# Patient Record
Sex: Female | Born: 1945 | Race: Black or African American | Hispanic: No | Smoking: Never smoker
Health system: Southern US, Community
[De-identification: ages and names within clinical notes are randomized; demographics above are authoritative.]

## PROBLEM LIST (undated history)

## (undated) DIAGNOSIS — K219 Gastro-esophageal reflux disease without esophagitis: Secondary | ICD-10-CM

## (undated) DIAGNOSIS — E039 Hypothyroidism, unspecified: Secondary | ICD-10-CM

## (undated) DIAGNOSIS — H409 Unspecified glaucoma: Secondary | ICD-10-CM

## (undated) DIAGNOSIS — R0683 Snoring: Secondary | ICD-10-CM

## (undated) DIAGNOSIS — F419 Anxiety disorder, unspecified: Secondary | ICD-10-CM

## (undated) DIAGNOSIS — E785 Hyperlipidemia, unspecified: Secondary | ICD-10-CM

## (undated) DIAGNOSIS — F32A Depression, unspecified: Secondary | ICD-10-CM

## (undated) DIAGNOSIS — R5383 Other fatigue: Secondary | ICD-10-CM

## (undated) DIAGNOSIS — E119 Type 2 diabetes mellitus without complications: Secondary | ICD-10-CM

## (undated) DIAGNOSIS — F329 Major depressive disorder, single episode, unspecified: Secondary | ICD-10-CM

## (undated) DIAGNOSIS — G4730 Sleep apnea, unspecified: Secondary | ICD-10-CM

## (undated) DIAGNOSIS — D649 Anemia, unspecified: Secondary | ICD-10-CM

## (undated) DIAGNOSIS — E669 Obesity, unspecified: Secondary | ICD-10-CM

## (undated) DIAGNOSIS — H269 Unspecified cataract: Secondary | ICD-10-CM

## (undated) DIAGNOSIS — R002 Palpitations: Secondary | ICD-10-CM

## (undated) DIAGNOSIS — M1990 Unspecified osteoarthritis, unspecified site: Secondary | ICD-10-CM

## (undated) DIAGNOSIS — R413 Other amnesia: Secondary | ICD-10-CM

## (undated) DIAGNOSIS — R0902 Hypoxemia: Secondary | ICD-10-CM

## (undated) DIAGNOSIS — I10 Essential (primary) hypertension: Secondary | ICD-10-CM

## (undated) HISTORY — DX: Other fatigue: R53.83

## (undated) HISTORY — PX: REFRACTIVE SURGERY: SHX103

## (undated) HISTORY — DX: Hypothyroidism, unspecified: E03.9

## (undated) HISTORY — DX: Depression, unspecified: F32.A

## (undated) HISTORY — DX: Hyperlipidemia, unspecified: E78.5

## (undated) HISTORY — DX: Anemia, unspecified: D64.9

## (undated) HISTORY — DX: Other amnesia: R41.3

## (undated) HISTORY — DX: Anxiety disorder, unspecified: F41.9

## (undated) HISTORY — DX: Unspecified osteoarthritis, unspecified site: M19.90

## (undated) HISTORY — DX: Unspecified cataract: H26.9

## (undated) HISTORY — DX: Major depressive disorder, single episode, unspecified: F32.9

## (undated) HISTORY — DX: Snoring: R06.83

## (undated) HISTORY — PX: ROTATOR CUFF REPAIR: SHX139

## (undated) HISTORY — PX: TUBAL LIGATION: SHX77

## (undated) HISTORY — PX: CATARACT EXTRACTION: SUR2

## (undated) HISTORY — PX: FOOT SURGERY: SHX648

## (undated) HISTORY — PX: OTHER SURGICAL HISTORY: SHX169

## (undated) HISTORY — DX: Type 2 diabetes mellitus without complications: E11.9

## (undated) HISTORY — DX: Unspecified glaucoma: H40.9

## (undated) HISTORY — DX: Essential (primary) hypertension: I10

## (undated) HISTORY — DX: Obesity, unspecified: E66.9

## (undated) HISTORY — DX: Sleep apnea, unspecified: G47.30

## (undated) HISTORY — DX: Palpitations: R00.2

## (undated) HISTORY — DX: Hypoxemia: R09.02

## (undated) HISTORY — DX: Gastro-esophageal reflux disease without esophagitis: K21.9

---

## 1998-07-15 ENCOUNTER — Other Ambulatory Visit: Admission: RE | Admit: 1998-07-15 | Discharge: 1998-07-15 | Payer: Self-pay | Admitting: Obstetrics

## 1998-10-13 ENCOUNTER — Emergency Department (HOSPITAL_COMMUNITY): Admission: EM | Admit: 1998-10-13 | Discharge: 1998-10-13 | Payer: Self-pay | Admitting: Emergency Medicine

## 1998-12-19 ENCOUNTER — Ambulatory Visit (HOSPITAL_COMMUNITY): Admission: RE | Admit: 1998-12-19 | Discharge: 1998-12-19 | Payer: Self-pay | Admitting: Nephrology

## 1998-12-19 ENCOUNTER — Encounter: Payer: Self-pay | Admitting: Nephrology

## 2000-08-31 ENCOUNTER — Other Ambulatory Visit: Admission: RE | Admit: 2000-08-31 | Discharge: 2000-08-31 | Payer: Self-pay | Admitting: Obstetrics

## 2000-10-26 ENCOUNTER — Encounter: Admission: RE | Admit: 2000-10-26 | Discharge: 2000-10-26 | Payer: Self-pay | Admitting: Nephrology

## 2000-10-26 ENCOUNTER — Encounter: Payer: Self-pay | Admitting: Nephrology

## 2000-11-10 ENCOUNTER — Encounter: Admission: RE | Admit: 2000-11-10 | Discharge: 2000-11-10 | Payer: Self-pay | Admitting: Nephrology

## 2000-11-10 ENCOUNTER — Encounter: Payer: Self-pay | Admitting: Nephrology

## 2001-08-09 ENCOUNTER — Emergency Department (HOSPITAL_COMMUNITY): Admission: EM | Admit: 2001-08-09 | Discharge: 2001-08-09 | Payer: Self-pay | Admitting: Emergency Medicine

## 2001-10-13 ENCOUNTER — Ambulatory Visit (HOSPITAL_COMMUNITY): Admission: RE | Admit: 2001-10-13 | Discharge: 2001-10-13 | Payer: Self-pay | Admitting: Cardiovascular Disease

## 2001-10-13 ENCOUNTER — Encounter: Payer: Self-pay | Admitting: Cardiovascular Disease

## 2001-11-29 ENCOUNTER — Ambulatory Visit (HOSPITAL_COMMUNITY): Admission: RE | Admit: 2001-11-29 | Discharge: 2001-11-29 | Payer: Self-pay | Admitting: Cardiology

## 2002-03-24 ENCOUNTER — Ambulatory Visit (HOSPITAL_COMMUNITY): Admission: RE | Admit: 2002-03-24 | Discharge: 2002-03-24 | Payer: Self-pay | Admitting: Cardiology

## 2002-07-31 ENCOUNTER — Encounter: Admission: RE | Admit: 2002-07-31 | Discharge: 2002-07-31 | Payer: Self-pay | Admitting: Nephrology

## 2002-07-31 ENCOUNTER — Encounter: Payer: Self-pay | Admitting: Nephrology

## 2003-05-22 LAB — CONVERTED CEMR LAB: Pap Smear: NORMAL

## 2003-07-19 ENCOUNTER — Emergency Department (HOSPITAL_COMMUNITY): Admission: EM | Admit: 2003-07-19 | Discharge: 2003-07-19 | Payer: Self-pay

## 2003-10-11 ENCOUNTER — Other Ambulatory Visit (HOSPITAL_COMMUNITY): Admission: RE | Admit: 2003-10-11 | Discharge: 2003-10-15 | Payer: Self-pay | Admitting: Psychiatry

## 2003-10-15 ENCOUNTER — Inpatient Hospital Stay (HOSPITAL_COMMUNITY): Admission: EM | Admit: 2003-10-15 | Discharge: 2003-10-21 | Payer: Self-pay | Admitting: Psychiatry

## 2003-10-18 ENCOUNTER — Ambulatory Visit (HOSPITAL_COMMUNITY): Admission: RE | Admit: 2003-10-18 | Discharge: 2003-10-18 | Payer: Self-pay | Admitting: Psychiatry

## 2003-10-22 ENCOUNTER — Other Ambulatory Visit (HOSPITAL_COMMUNITY): Admission: RE | Admit: 2003-10-22 | Discharge: 2003-11-02 | Payer: Self-pay | Admitting: Psychiatry

## 2003-11-28 ENCOUNTER — Inpatient Hospital Stay (HOSPITAL_COMMUNITY): Admission: EM | Admit: 2003-11-28 | Discharge: 2003-12-01 | Payer: Self-pay | Admitting: Psychiatry

## 2003-12-12 ENCOUNTER — Inpatient Hospital Stay (HOSPITAL_COMMUNITY): Admission: EM | Admit: 2003-12-12 | Discharge: 2003-12-15 | Payer: Self-pay | Admitting: Psychiatry

## 2004-01-23 ENCOUNTER — Emergency Department (HOSPITAL_COMMUNITY): Admission: EM | Admit: 2004-01-23 | Discharge: 2004-01-23 | Payer: Self-pay | Admitting: Family Medicine

## 2004-01-27 ENCOUNTER — Inpatient Hospital Stay (HOSPITAL_COMMUNITY): Admission: AD | Admit: 2004-01-27 | Discharge: 2004-02-06 | Payer: Self-pay | Admitting: Family Medicine

## 2004-05-01 ENCOUNTER — Emergency Department (HOSPITAL_COMMUNITY): Admission: EM | Admit: 2004-05-01 | Discharge: 2004-05-01 | Payer: Self-pay | Admitting: Family Medicine

## 2006-02-19 ENCOUNTER — Inpatient Hospital Stay (HOSPITAL_COMMUNITY): Admission: EM | Admit: 2006-02-19 | Discharge: 2006-02-23 | Payer: Self-pay | Admitting: Emergency Medicine

## 2006-04-06 ENCOUNTER — Ambulatory Visit (HOSPITAL_BASED_OUTPATIENT_CLINIC_OR_DEPARTMENT_OTHER): Admission: RE | Admit: 2006-04-06 | Discharge: 2006-04-06 | Payer: Self-pay | Admitting: Cardiology

## 2006-04-11 ENCOUNTER — Ambulatory Visit: Payer: Self-pay | Admitting: Internal Medicine

## 2006-05-25 ENCOUNTER — Ambulatory Visit (HOSPITAL_BASED_OUTPATIENT_CLINIC_OR_DEPARTMENT_OTHER): Admission: RE | Admit: 2006-05-25 | Discharge: 2006-05-25 | Payer: Self-pay | Admitting: Cardiology

## 2006-05-30 ENCOUNTER — Ambulatory Visit: Payer: Self-pay | Admitting: Internal Medicine

## 2006-09-25 ENCOUNTER — Inpatient Hospital Stay (HOSPITAL_COMMUNITY): Admission: EM | Admit: 2006-09-25 | Discharge: 2006-09-28 | Payer: Self-pay | Admitting: Family Medicine

## 2006-10-15 ENCOUNTER — Emergency Department (HOSPITAL_COMMUNITY): Admission: EM | Admit: 2006-10-15 | Discharge: 2006-10-15 | Payer: Self-pay | Admitting: Family Medicine

## 2006-10-18 ENCOUNTER — Ambulatory Visit: Payer: Self-pay | Admitting: Gastroenterology

## 2006-11-10 ENCOUNTER — Ambulatory Visit: Payer: Self-pay | Admitting: Gastroenterology

## 2006-11-23 ENCOUNTER — Ambulatory Visit: Payer: Self-pay | Admitting: Gastroenterology

## 2006-11-24 ENCOUNTER — Ambulatory Visit: Payer: Self-pay | Admitting: *Deleted

## 2007-02-23 LAB — HM COLONOSCOPY: HM Colonoscopy: NORMAL

## 2007-03-04 ENCOUNTER — Emergency Department (HOSPITAL_COMMUNITY): Admission: EM | Admit: 2007-03-04 | Discharge: 2007-03-05 | Payer: Self-pay | Admitting: Family Medicine

## 2007-03-18 ENCOUNTER — Inpatient Hospital Stay (HOSPITAL_COMMUNITY): Admission: EM | Admit: 2007-03-18 | Discharge: 2007-03-23 | Payer: Self-pay | Admitting: Emergency Medicine

## 2007-03-29 ENCOUNTER — Encounter: Admission: RE | Admit: 2007-03-29 | Discharge: 2007-06-27 | Payer: Self-pay | Admitting: Cardiology

## 2008-07-10 ENCOUNTER — Encounter: Admission: RE | Admit: 2008-07-10 | Discharge: 2008-07-10 | Payer: Self-pay | Admitting: Cardiology

## 2009-02-20 LAB — HM DIABETES EYE EXAM: HM Diabetic Eye Exam: NORMAL

## 2009-05-06 ENCOUNTER — Ambulatory Visit: Payer: Self-pay | Admitting: Internal Medicine

## 2009-05-06 DIAGNOSIS — D519 Vitamin B12 deficiency anemia, unspecified: Secondary | ICD-10-CM | POA: Insufficient documentation

## 2009-05-06 DIAGNOSIS — F418 Other specified anxiety disorders: Secondary | ICD-10-CM | POA: Insufficient documentation

## 2009-05-06 DIAGNOSIS — K219 Gastro-esophageal reflux disease without esophagitis: Secondary | ICD-10-CM | POA: Insufficient documentation

## 2009-05-06 DIAGNOSIS — I10 Essential (primary) hypertension: Secondary | ICD-10-CM | POA: Insufficient documentation

## 2009-05-06 DIAGNOSIS — E785 Hyperlipidemia, unspecified: Secondary | ICD-10-CM | POA: Insufficient documentation

## 2009-05-06 DIAGNOSIS — E118 Type 2 diabetes mellitus with unspecified complications: Secondary | ICD-10-CM | POA: Insufficient documentation

## 2009-05-06 LAB — CONVERTED CEMR LAB
ALT: 30 units/L (ref 0–35)
AST: 36 units/L (ref 0–37)
Albumin: 3.9 g/dL (ref 3.5–5.2)
Alkaline Phosphatase: 79 units/L (ref 39–117)
BUN: 11 mg/dL (ref 6–23)
Basophils Absolute: 0 10*3/uL (ref 0.0–0.1)
Basophils Relative: 0.4 % (ref 0.0–3.0)
Bilirubin Urine: NEGATIVE
Bilirubin, Direct: 0.1 mg/dL (ref 0.0–0.3)
CO2: 22 meq/L (ref 19–32)
Calcium: 9.2 mg/dL (ref 8.4–10.5)
Chloride: 109 meq/L (ref 96–112)
Cholesterol: 235 mg/dL — ABNORMAL HIGH (ref 0–200)
Creatinine, Ser: 0.8 mg/dL (ref 0.4–1.2)
Creatinine,U: 128.7 mg/dL
Direct LDL: 133.1 mg/dL
Eosinophils Absolute: 0.1 10*3/uL (ref 0.0–0.7)
Eosinophils Relative: 1.8 % (ref 0.0–5.0)
Folate: 12.9 ng/mL
GFR calc non Af Amer: 93.73 mL/min (ref >60)
Glucose, Bld: 142 mg/dL — ABNORMAL HIGH (ref 70–99)
HCT: 29 % — ABNORMAL LOW (ref 36.0–46.0)
HDL: 40.2 mg/dL (ref >39.00)
Hemoglobin, Urine: NEGATIVE
Hemoglobin: 9.9 g/dL — ABNORMAL LOW (ref 12.0–15.0)
Hgb A1c MFr Bld: 6.7 % — ABNORMAL HIGH (ref 4.6–6.5)
Iron: 43 ug/dL (ref 42–145)
Ketones, ur: NEGATIVE mg/dL
Leukocytes, UA: NEGATIVE
Lymphocytes Relative: 34.1 % (ref 12.0–46.0)
Lymphs Abs: 2.6 10*3/uL (ref 0.7–4.0)
MCHC: 34.1 g/dL (ref 30.0–36.0)
MCV: 84.1 fL (ref 78.0–100.0)
Microalb Creat Ratio: 9.3 mg/g (ref 0.0–30.0)
Microalb, Ur: 1.2 mg/dL (ref 0.0–1.9)
Monocytes Absolute: 0.6 10*3/uL (ref 0.1–1.0)
Monocytes Relative: 8 % (ref 3.0–12.0)
Neutro Abs: 4.3 10*3/uL (ref 1.4–7.7)
Neutrophils Relative %: 55.7 % (ref 43.0–77.0)
Nitrite: NEGATIVE
Platelets: 326 10*3/uL (ref 150.0–400.0)
Potassium: 3.9 meq/L (ref 3.5–5.1)
RBC: 3.45 M/uL — ABNORMAL LOW (ref 3.87–5.11)
RDW: 15.9 % — ABNORMAL HIGH (ref 11.5–14.6)
Saturation Ratios: 8.6 % — ABNORMAL LOW (ref 20.0–50.0)
Sodium: 140 meq/L (ref 135–145)
Specific Gravity, Urine: 1.02 (ref 1.000–1.030)
TSH: 2.84 microintl units/mL (ref 0.35–5.50)
Total Bilirubin: 0.5 mg/dL (ref 0.3–1.2)
Total CHOL/HDL Ratio: 6
Total CK: 150 units/L (ref 7–177)
Total Protein, Urine: NEGATIVE mg/dL
Total Protein: 7.3 g/dL (ref 6.0–8.3)
Transferrin: 358.8 mg/dL (ref 212.0–360.0)
Triglycerides: 342 mg/dL — ABNORMAL HIGH (ref 0.0–149.0)
Urine Glucose: NEGATIVE mg/dL
Urobilinogen, UA: 0.2 (ref 0.0–1.0)
VLDL: 68.4 mg/dL — ABNORMAL HIGH (ref 0.0–40.0)
Vitamin B-12: 313 pg/mL (ref 211–911)
WBC: 7.6 10*3/uL (ref 4.5–10.5)
pH: 5.5 (ref 5.0–8.0)

## 2009-05-09 ENCOUNTER — Encounter: Admission: RE | Admit: 2009-05-09 | Discharge: 2009-05-15 | Payer: Self-pay | Admitting: Internal Medicine

## 2009-05-24 ENCOUNTER — Telehealth: Payer: Self-pay | Admitting: Internal Medicine

## 2009-06-19 ENCOUNTER — Ambulatory Visit: Payer: Self-pay | Admitting: Internal Medicine

## 2009-09-21 ENCOUNTER — Emergency Department (HOSPITAL_COMMUNITY): Admission: EM | Admit: 2009-09-21 | Discharge: 2009-09-21 | Payer: Self-pay | Admitting: Family Medicine

## 2009-09-25 ENCOUNTER — Ambulatory Visit: Payer: Self-pay | Admitting: Internal Medicine

## 2009-10-25 ENCOUNTER — Ambulatory Visit: Payer: Self-pay | Admitting: Internal Medicine

## 2009-10-25 DIAGNOSIS — J4520 Mild intermittent asthma, uncomplicated: Secondary | ICD-10-CM | POA: Insufficient documentation

## 2009-10-25 LAB — CONVERTED CEMR LAB
ALT: 30 units/L (ref 0–35)
AST: 39 units/L — ABNORMAL HIGH (ref 0–37)
Albumin: 4.2 g/dL (ref 3.5–5.2)
Alkaline Phosphatase: 83 units/L (ref 39–117)
BUN: 10 mg/dL (ref 6–23)
Basophils Absolute: 0.1 10*3/uL (ref 0.0–0.1)
Basophils Relative: 0.9 % (ref 0.0–3.0)
Bilirubin Urine: NEGATIVE
Bilirubin, Direct: 0.1 mg/dL (ref 0.0–0.3)
CO2: 27 meq/L (ref 19–32)
Calcium: 9.2 mg/dL (ref 8.4–10.5)
Chloride: 104 meq/L (ref 96–112)
Creatinine, Ser: 0.8 mg/dL (ref 0.4–1.2)
Eosinophils Absolute: 0.1 10*3/uL (ref 0.0–0.7)
Eosinophils Relative: 1.4 % (ref 0.0–5.0)
GFR calc non Af Amer: 93.58 mL/min (ref >60)
Glucose, Bld: 145 mg/dL — ABNORMAL HIGH (ref 70–99)
HCT: 30.1 % — ABNORMAL LOW (ref 36.0–46.0)
Hemoglobin, Urine: NEGATIVE
Hemoglobin: 9.9 g/dL — ABNORMAL LOW (ref 12.0–15.0)
Hgb A1c MFr Bld: 7.4 % — ABNORMAL HIGH (ref 4.6–6.5)
Ketones, ur: NEGATIVE mg/dL
Leukocytes, UA: NEGATIVE
Lymphocytes Relative: 26.9 % (ref 12.0–46.0)
Lymphs Abs: 2.6 10*3/uL (ref 0.7–4.0)
MCHC: 33 g/dL (ref 30.0–36.0)
MCV: 80.8 fL (ref 78.0–100.0)
Monocytes Absolute: 0.7 10*3/uL (ref 0.1–1.0)
Monocytes Relative: 6.9 % (ref 3.0–12.0)
Neutro Abs: 6.1 10*3/uL (ref 1.4–7.7)
Neutrophils Relative %: 63.9 % (ref 43.0–77.0)
Nitrite: NEGATIVE
Platelets: 343 10*3/uL (ref 150.0–400.0)
Potassium: 4.2 meq/L (ref 3.5–5.1)
RBC: 3.72 M/uL — ABNORMAL LOW (ref 3.87–5.11)
RDW: 17.2 % — ABNORMAL HIGH (ref 11.5–14.6)
Sodium: 142 meq/L (ref 135–145)
Specific Gravity, Urine: 1.025 (ref 1.000–1.030)
TSH: 1.84 microintl units/mL (ref 0.35–5.50)
Total Bilirubin: 0.5 mg/dL (ref 0.3–1.2)
Total Protein, Urine: NEGATIVE mg/dL
Total Protein: 7.5 g/dL (ref 6.0–8.3)
Urine Glucose: NEGATIVE mg/dL
Urobilinogen, UA: 0.2 (ref 0.0–1.0)
WBC: 9.6 10*3/uL (ref 4.5–10.5)
pH: 6 (ref 5.0–8.0)

## 2009-10-29 ENCOUNTER — Encounter: Payer: Self-pay | Admitting: Internal Medicine

## 2009-11-04 ENCOUNTER — Telehealth: Payer: Self-pay | Admitting: Internal Medicine

## 2009-11-14 ENCOUNTER — Encounter: Payer: Self-pay | Admitting: Internal Medicine

## 2009-12-27 ENCOUNTER — Ambulatory Visit: Payer: Self-pay | Admitting: Internal Medicine

## 2009-12-27 DIAGNOSIS — E8881 Metabolic syndrome: Secondary | ICD-10-CM | POA: Insufficient documentation

## 2009-12-31 ENCOUNTER — Emergency Department (HOSPITAL_COMMUNITY): Admission: EM | Admit: 2009-12-31 | Discharge: 2009-12-31 | Payer: Self-pay | Admitting: Emergency Medicine

## 2010-02-05 ENCOUNTER — Ambulatory Visit: Payer: Self-pay | Admitting: Internal Medicine

## 2010-02-05 LAB — CONVERTED CEMR LAB
ALT: 20 units/L (ref 0–35)
AST: 24 units/L (ref 0–37)
Albumin: 4 g/dL (ref 3.5–5.2)
Alkaline Phosphatase: 76 units/L (ref 39–117)
BUN: 7 mg/dL (ref 6–23)
Basophils Absolute: 0.1 10*3/uL (ref 0.0–0.1)
Basophils Relative: 1 % (ref 0.0–3.0)
Bilirubin Urine: NEGATIVE
Bilirubin, Direct: 0.1 mg/dL (ref 0.0–0.3)
CO2: 29 meq/L (ref 19–32)
Calcium: 9.3 mg/dL (ref 8.4–10.5)
Chloride: 104 meq/L (ref 96–112)
Creatinine, Ser: 0.8 mg/dL (ref 0.4–1.2)
Eosinophils Absolute: 0.2 10*3/uL (ref 0.0–0.7)
Eosinophils Relative: 1.8 % (ref 0.0–5.0)
Folate: 11.5 ng/mL
GFR calc non Af Amer: 93.5 mL/min (ref >60)
Glucose, Bld: 100 mg/dL — ABNORMAL HIGH (ref 70–99)
HCT: 26 % — ABNORMAL LOW (ref 36.0–46.0)
Hemoglobin, Urine: NEGATIVE
Hemoglobin: 8.3 g/dL — ABNORMAL LOW (ref 12.0–15.0)
Hgb A1c MFr Bld: 6 % (ref 4.6–6.5)
Iron: 32 ug/dL — ABNORMAL LOW (ref 42–145)
Ketones, ur: NEGATIVE mg/dL
Leukocytes, UA: NEGATIVE
Lymphocytes Relative: 29.2 % (ref 12.0–46.0)
Lymphs Abs: 3.1 10*3/uL (ref 0.7–4.0)
MCHC: 31.8 g/dL (ref 30.0–36.0)
MCV: 77.7 fL — ABNORMAL LOW (ref 78.0–100.0)
Monocytes Absolute: 0.9 10*3/uL (ref 0.1–1.0)
Monocytes Relative: 8.6 % (ref 3.0–12.0)
Neutro Abs: 6.2 10*3/uL (ref 1.4–7.7)
Neutrophils Relative %: 59.4 % (ref 43.0–77.0)
Nitrite: NEGATIVE
Platelets: 380 10*3/uL (ref 150.0–400.0)
Potassium: 4.2 meq/L (ref 3.5–5.1)
RBC: 3.35 M/uL — ABNORMAL LOW (ref 3.87–5.11)
RDW: 19.4 % — ABNORMAL HIGH (ref 11.5–14.6)
Saturation Ratios: 6 % — ABNORMAL LOW (ref 20.0–50.0)
Sodium: 141 meq/L (ref 135–145)
Specific Gravity, Urine: 1.015 (ref 1.000–1.030)
Total Bilirubin: 0.3 mg/dL (ref 0.3–1.2)
Total Protein, Urine: NEGATIVE mg/dL
Total Protein: 7.2 g/dL (ref 6.0–8.3)
Transferrin: 379.9 mg/dL — ABNORMAL HIGH (ref 212.0–360.0)
Urine Glucose: NEGATIVE mg/dL
Urobilinogen, UA: 0.2 (ref 0.0–1.0)
Vitamin B-12: 378 pg/mL (ref 211–911)
WBC: 10.5 10*3/uL (ref 4.5–10.5)
pH: 6 (ref 5.0–8.0)

## 2010-02-06 ENCOUNTER — Encounter: Payer: Self-pay | Admitting: Internal Medicine

## 2010-02-12 ENCOUNTER — Encounter: Payer: Self-pay | Admitting: Internal Medicine

## 2010-02-14 ENCOUNTER — Ambulatory Visit: Payer: Self-pay | Admitting: Internal Medicine

## 2010-02-19 ENCOUNTER — Encounter: Payer: Self-pay | Admitting: Internal Medicine

## 2010-02-21 ENCOUNTER — Telehealth: Payer: Self-pay | Admitting: Internal Medicine

## 2010-02-25 ENCOUNTER — Encounter: Payer: Self-pay | Admitting: Internal Medicine

## 2010-04-09 ENCOUNTER — Telehealth: Payer: Self-pay | Admitting: Internal Medicine

## 2010-05-07 ENCOUNTER — Ambulatory Visit: Payer: Self-pay | Admitting: Internal Medicine

## 2010-05-21 ENCOUNTER — Ambulatory Visit: Payer: Self-pay | Admitting: Internal Medicine

## 2010-06-24 ENCOUNTER — Ambulatory Visit: Payer: Self-pay | Admitting: Cardiology

## 2010-06-24 DIAGNOSIS — E669 Obesity, unspecified: Secondary | ICD-10-CM | POA: Insufficient documentation

## 2010-06-25 ENCOUNTER — Telehealth: Payer: Self-pay | Admitting: Cardiology

## 2010-07-01 ENCOUNTER — Telehealth: Payer: Self-pay | Admitting: Cardiology

## 2010-07-08 ENCOUNTER — Telehealth (INDEPENDENT_AMBULATORY_CARE_PROVIDER_SITE_OTHER): Payer: Self-pay | Admitting: *Deleted

## 2010-07-09 ENCOUNTER — Encounter (HOSPITAL_COMMUNITY): Admission: RE | Admit: 2010-07-09 | Discharge: 2010-09-09 | Payer: Self-pay | Admitting: Cardiology

## 2010-07-09 ENCOUNTER — Ambulatory Visit: Payer: Self-pay | Admitting: Internal Medicine

## 2010-07-09 ENCOUNTER — Encounter (INDEPENDENT_AMBULATORY_CARE_PROVIDER_SITE_OTHER): Payer: Self-pay | Admitting: *Deleted

## 2010-07-09 ENCOUNTER — Encounter: Payer: Self-pay | Admitting: Internal Medicine

## 2010-07-09 ENCOUNTER — Ambulatory Visit: Payer: Self-pay | Admitting: Cardiology

## 2010-07-09 ENCOUNTER — Ambulatory Visit: Payer: Self-pay

## 2010-07-12 ENCOUNTER — Emergency Department (HOSPITAL_COMMUNITY): Admission: EM | Admit: 2010-07-12 | Discharge: 2010-07-13 | Payer: Self-pay | Admitting: Emergency Medicine

## 2010-07-14 LAB — CONVERTED CEMR LAB
ALT: 29 units/L (ref 0–35)
AST: 39 units/L — ABNORMAL HIGH (ref 0–37)
Albumin: 3.8 g/dL (ref 3.5–5.2)
Alkaline Phosphatase: 94 units/L (ref 39–117)
Bilirubin, Direct: 0.1 mg/dL (ref 0.0–0.3)
Cholesterol: 172 mg/dL (ref 0–200)
Direct LDL: 61 mg/dL
HDL: 43.1 mg/dL (ref >39.00)
Total Bilirubin: 0.5 mg/dL (ref 0.3–1.2)
Total CHOL/HDL Ratio: 4
Total Protein: 7.1 g/dL (ref 6.0–8.3)
Triglycerides: 435 mg/dL — ABNORMAL HIGH (ref 0.0–149.0)
VLDL: 87 mg/dL — ABNORMAL HIGH (ref 0.0–40.0)

## 2010-07-17 ENCOUNTER — Encounter: Payer: Self-pay | Admitting: Internal Medicine

## 2010-07-17 ENCOUNTER — Ambulatory Visit: Payer: Self-pay

## 2010-07-18 ENCOUNTER — Ambulatory Visit: Payer: Self-pay | Admitting: Internal Medicine

## 2010-09-04 ENCOUNTER — Ambulatory Visit: Payer: Self-pay | Admitting: Internal Medicine

## 2010-09-22 ENCOUNTER — Telehealth: Payer: Self-pay | Admitting: Internal Medicine

## 2010-10-17 ENCOUNTER — Ambulatory Visit: Payer: Self-pay | Admitting: Internal Medicine

## 2010-10-17 LAB — CONVERTED CEMR LAB
ALT: 28 units/L (ref 0–35)
AST: 40 units/L — ABNORMAL HIGH (ref 0–37)
Albumin: 4.1 g/dL (ref 3.5–5.2)
Alkaline Phosphatase: 82 units/L (ref 39–117)
BUN: 10 mg/dL (ref 6–23)
Basophils Absolute: 0.1 10*3/uL (ref 0.0–0.1)
Basophils Relative: 0.6 % (ref 0.0–3.0)
Bilirubin Urine: NEGATIVE
Bilirubin, Direct: 0.1 mg/dL (ref 0.0–0.3)
CO2: 29 meq/L (ref 19–32)
Calcium: 9.6 mg/dL (ref 8.4–10.5)
Chloride: 105 meq/L (ref 96–112)
Creatinine, Ser: 0.7 mg/dL (ref 0.4–1.2)
Creatinine,U: 232.9 mg/dL
Eosinophils Absolute: 0.2 10*3/uL (ref 0.0–0.7)
Eosinophils Relative: 1.5 % (ref 0.0–5.0)
GFR calc non Af Amer: 103.68 mL/min (ref >60)
Glucose, Bld: 102 mg/dL — ABNORMAL HIGH (ref 70–99)
HCT: 31.6 % — ABNORMAL LOW (ref 36.0–46.0)
Hemoglobin, Urine: NEGATIVE
Hemoglobin: 10.6 g/dL — ABNORMAL LOW (ref 12.0–15.0)
Hgb A1c MFr Bld: 6.7 % — ABNORMAL HIGH (ref 4.6–6.5)
Ketones, ur: NEGATIVE mg/dL
Leukocytes, UA: NEGATIVE
Lymphocytes Relative: 25.1 % (ref 12.0–46.0)
Lymphs Abs: 2.9 10*3/uL (ref 0.7–4.0)
MCHC: 33.5 g/dL (ref 30.0–36.0)
MCV: 85.4 fL (ref 78.0–100.0)
Microalb Creat Ratio: 1.4 mg/g (ref 0.0–30.0)
Microalb, Ur: 3.2 mg/dL — ABNORMAL HIGH (ref 0.0–1.9)
Monocytes Absolute: 1.1 10*3/uL — ABNORMAL HIGH (ref 0.1–1.0)
Monocytes Relative: 9.4 % (ref 3.0–12.0)
Neutro Abs: 7.2 10*3/uL (ref 1.4–7.7)
Neutrophils Relative %: 63.4 % (ref 43.0–77.0)
Nitrite: NEGATIVE
Platelets: 354 10*3/uL (ref 150.0–400.0)
Potassium: 4.1 meq/L (ref 3.5–5.1)
RBC: 3.71 M/uL — ABNORMAL LOW (ref 3.87–5.11)
RDW: 17.7 % — ABNORMAL HIGH (ref 11.5–14.6)
Sodium: 143 meq/L (ref 135–145)
Specific Gravity, Urine: >=1.03 (ref 1.000–1.030)
TSH: 2.11 microintl units/mL (ref 0.35–5.50)
Total Bilirubin: 0.3 mg/dL (ref 0.3–1.2)
Total Protein, Urine: NEGATIVE mg/dL
Total Protein: 7.1 g/dL (ref 6.0–8.3)
Urine Glucose: NEGATIVE mg/dL
Urobilinogen, UA: 0.2 (ref 0.0–1.0)
WBC: 11.4 10*3/uL — ABNORMAL HIGH (ref 4.5–10.5)
pH: 5.5 (ref 5.0–8.0)

## 2010-10-18 ENCOUNTER — Encounter: Payer: Self-pay | Admitting: Internal Medicine

## 2010-10-23 ENCOUNTER — Telehealth: Payer: Self-pay | Admitting: Internal Medicine

## 2010-11-13 ENCOUNTER — Encounter: Payer: Self-pay | Admitting: Internal Medicine

## 2010-11-18 ENCOUNTER — Encounter: Payer: Self-pay | Admitting: Internal Medicine

## 2010-11-18 LAB — HM MAMMOGRAPHY: HM Mammogram: NORMAL

## 2010-11-26 ENCOUNTER — Telehealth: Payer: Self-pay | Admitting: Internal Medicine

## 2011-01-05 ENCOUNTER — Encounter: Payer: Self-pay | Admitting: Cardiology

## 2011-01-11 LAB — CONVERTED CEMR LAB
ALT: 41 units/L — ABNORMAL HIGH (ref 0–35)
AST: 45 units/L — ABNORMAL HIGH (ref 0–37)
Albumin: 4.5 g/dL (ref 3.5–5.2)
Alkaline Phosphatase: 71 units/L (ref 39–117)
BUN: 13 mg/dL (ref 6–23)
Basophils Absolute: 0 10*3/uL (ref 0.0–0.1)
Basophils Relative: 0.3 % (ref 0.0–3.0)
Bilirubin, Direct: 0 mg/dL (ref 0.0–0.3)
CK-MB: 1.3 ng/mL (ref 0.3–4.0)
CO2: 27 meq/L (ref 19–32)
Calcium: 9.9 mg/dL (ref 8.4–10.5)
Chloride: 105 meq/L (ref 96–112)
Cholesterol, target level: 200 mg/dL
Creatinine, Ser: 0.8 mg/dL (ref 0.4–1.2)
Eosinophils Absolute: 0.2 10*3/uL (ref 0.0–0.7)
Eosinophils Relative: 1.6 % (ref 0.0–5.0)
GFR calc non Af Amer: 99.12 mL/min (ref >60)
Glucose, Bld: 97 mg/dL (ref 70–99)
HCT: 32.6 % — ABNORMAL LOW (ref 36.0–46.0)
HDL goal, serum: 40 mg/dL
Hemoglobin: 11 g/dL — ABNORMAL LOW (ref 12.0–15.0)
Hgb A1c MFr Bld: 5.7 % (ref 4.6–6.5)
Iron: 87 ug/dL (ref 42–145)
LDL Goal: 100 mg/dL
Lymphocytes Relative: 24.9 % (ref 12.0–46.0)
Lymphs Abs: 2.7 10*3/uL (ref 0.7–4.0)
MCHC: 33.7 g/dL (ref 30.0–36.0)
MCV: 85.7 fL (ref 78.0–100.0)
Monocytes Absolute: 0.9 10*3/uL (ref 0.1–1.0)
Monocytes Relative: 8.2 % (ref 3.0–12.0)
Neutro Abs: 7.1 10*3/uL (ref 1.4–7.7)
Neutrophils Relative %: 65 % (ref 43.0–77.0)
Platelets: 349 10*3/uL (ref 150.0–400.0)
Potassium: 4.6 meq/L (ref 3.5–5.1)
RBC: 3.81 M/uL — ABNORMAL LOW (ref 3.87–5.11)
RDW: 20.9 % — ABNORMAL HIGH (ref 11.5–14.6)
Saturation Ratios: 17.7 % — ABNORMAL LOW (ref 20.0–50.0)
Sodium: 143 meq/L (ref 135–145)
TSH: 2.31 microintl units/mL (ref 0.35–5.50)
Total Bilirubin: 0.3 mg/dL (ref 0.3–1.2)
Total Protein: 7.5 g/dL (ref 6.0–8.3)
Transferrin: 351.9 mg/dL (ref 212.0–360.0)
WBC: 10.9 10*3/uL — ABNORMAL HIGH (ref 4.5–10.5)

## 2011-01-15 NOTE — Letter (Signed)
Summary: Outpatient Coinsurance Notice  Outpatient Coinsurance Notice   Imported By: Marylou Mccoy 07/23/2010 14:43:46  _____________________________________________________________________  External Attachment:    Type:   Image     Comment:   External Document

## 2011-01-15 NOTE — Assessment & Plan Note (Signed)
Summary: 3 mos f/u #/cd   Vital Signs:  Patient profile:   65 year old female Menstrual status:  postmenopausal Height:      66 inches Weight:      271.50 pounds BMI:     43.98 O2 Sat:      96 % on Room air Temp:     97.5 degrees F oral Pulse rate:   90 / minute Pulse rhythm:   regular Resp:     16 per minute BP sitting:   122 / 70  (left arm) Cuff size:   large  Vitals Entered By: Rock Nephew CMA (October 17, 2010 2:28 PM)  Nutrition Counseling: Patient's BMI is greater than 25 and therefore counseled on weight management options.  O2 Flow:  Room air CC: follow-up visit, Preventive Care, Lipid Management Is Patient Diabetic? Yes Did you bring your meter with you today? No Pain Assessment Patient in pain? no       Does patient need assistance? Functional Status Self care Ambulation Normal     Menstrual Status postmenopausal Last PAP Result Normal   Primary Care Provider:  Etta Grandchild MD  CC:  follow-up visit, Preventive Care, and Lipid Management.  History of Present Illness:  Follow-Up Visit      This is a 65 year old woman who presents for Follow-up visit.  The patient denies chest pain, palpitations, dizziness, syncope, low blood sugar symptoms, high blood sugar symptoms, edema, SOB, DOE, and PND.  Since the last visit the patient notes no new problems or concerns.  The patient reports taking meds as prescribed, monitoring BP, monitoring blood sugars, and dietary noncompliance.  When questioned about possible medication side effects, the patient notes none.    Lipid Management History:      Positive NCEP/ATP III risk factors include female age 83 years old or older, diabetes, and hypertension.  Negative NCEP/ATP III risk factors include no family history for ischemic heart disease, non-tobacco-user status, no ASHD (atherosclerotic heart disease), no prior stroke/TIA, no peripheral vascular disease, and no history of aortic aneurysm.        The patient  states that she knows about the "Therapeutic Lifestyle Change" diet.  Her compliance with the TLC diet is not at all.  The patient expresses understanding of adjunctive measures for cholesterol lowering.  Adjunctive measures started by the patient include limit alcohol consumpton.  She expresses no side effects from her lipid-lowering medication.  The patient denies any symptoms to suggest myopathy or liver disease.    Preventive Screening-Counseling & Management  Alcohol-Tobacco     Alcohol drinks/day: 0     Alcohol Counseling: not indicated; patient does not drink     Smoking Status: never     Tobacco Counseling: not indicated; no tobacco use  Hep-HIV-STD-Contraception     Hepatitis Risk: no risk noted     HIV Risk: no risk noted     STD Risk: no risk noted      Sexual History:  currently monogamous.        Drug Use:  no.        Blood Transfusions:  no.    Clinical Review Panels:  Prevention   Last Mammogram:  Normal Bilateral (05/22/2009)   Last Pap Smear:  Normal (05/22/2003)   Last Colonoscopy:  Normal (02/23/2007)  Immunizations   Last Tetanus Booster:  Tdap (02/05/2010)   Last Flu Vaccine:  Fluvax 3+ (09/04/2010)  Lipid Management   Cholesterol:  172 (07/09/2010)  HDL (good cholesterol):  43.10 (07/09/2010)  Diabetes Management   HgBA1C:  5.7 (05/07/2010)   Creatinine:  0.8 (05/07/2010)   Last Dilated Eye Exam:  normal (02/20/2009)   Last Foot Exam:  yes (10/17/2010)   Last Flu Vaccine:  Fluvax 3+ (09/04/2010)  CBC   WBC:  10.9 (05/07/2010)   RBC:  3.81 (05/07/2010)   Hgb:  11.0 (05/07/2010)   Hct:  32.6 (05/07/2010)   Platelets:  349.0 (05/07/2010)   MCV  85.7 (05/07/2010)   MCHC  33.7 (05/07/2010)   RDW  20.9 (05/07/2010)   PMN:  65.0 (05/07/2010)   Lymphs:  24.9 (05/07/2010)   Monos:  8.2 (05/07/2010)   Eosinophils:  1.6 (05/07/2010)   Basophil:  0.3 (05/07/2010)  Complete Metabolic Panel   Glucose:  97 (05/07/2010)   Sodium:  143 (05/07/2010)    Potassium:  4.6 (05/07/2010)   Chloride:  105 (05/07/2010)   CO2:  27 (05/07/2010)   BUN:  13 (05/07/2010)   Creatinine:  0.8 (05/07/2010)   Albumin:  3.8 (07/09/2010)   Total Protein:  7.1 (07/09/2010)   Calcium:  9.9 (05/07/2010)   Total Bili:  0.5 (07/09/2010)   Alk Phos:  94 (07/09/2010)   SGPT (ALT):  29 (07/09/2010)   SGOT (AST):  39 (07/09/2010)   Medications Prior to Update: 1)  Coreg 25 Mg Tabs (Carvedilol) .[name].. 1 By Mouth Two Times A Day 2)  Crestor 10 Mg Tabs (Rosuvastatin Calcium) .... Take 1 Tablet By Mouth Once A Day 3)  Xanax 2 Mg Tabs (Alprazolam) .... Take 1 Tablet By Mouth Three Times A Day 4)  Lamictal 200 Mg Tabs (Lamotrigine) .... Take 1 Tablet By Mouth Once A Day 5)  Singulair 10 Mg Tabs (Montelukast Sodium) .... Take 1 Tablet By Mouth Once A Day 6)  Kapidex 60 Mg Cpdr (Dexlansoprazole) .... Hold 7)  Exforge 10-320 Mg Tabs (Amlodipine Besylate-Valsartan) .... Take 1 Tablet By Mouth Once A Day 8)  Bayer Contour Monitor W/device Kit (Blood Glucose Monitoring Suppl) .... Use Bid 9)  Bayer Contour Test  Strp (Glucose Blood) .... Use Bid 10)  Ventolin Hfa 108 (90 Base) Mcg/act Aers (Albuterol Sulfate) .... Use As Directed 11)  Actos 30 Mg Tabs (Pioglitazone Hcl) .... One By Mouth Once Daily For Diabetes 12)  Metformin Hcl 850 Mg Tabs (Metformin Hcl) .... One By Mouth Two Times A Day For Diabetes 13)  Feosol 200 (65 Fe) Mg Tabs (Ferrous Sulfate Dried) .... One By Mouth Two Times A Day With Food 14)  Nortryptilline .[name].. 3 By Mouth At Bedtime 15)  Nitrostat 0.4 Mg Subl (Nitroglycerin) .... One Every 5 Mins Under Tongue For Chest Pain Up To 3 Times.  If Not Resolved Call 911 16)  Isosorbide Mononitrate Cr 60 Mg Xr24h-Tab (Isosorbide Mononitrate) .... 1/2 Daily 17)  Meclizine Hcl 25 Mg Tabs (Meclizine Hcl) .... Take 1 Tablet By Mouth Three Times A Day 18)  Sulfamethoxazole-Tmp Ds 800-160 Mg Tabs (Sulfamethoxazole-Trimethoprim) .... Take 1 Tablet By Mouth Two Times A Day X  5 Days  Current Medications (verified): 1)  Coreg 25 Mg Tabs (Carvedilol) .[name].. 1 By Mouth Two Times A Day 2)  Crestor 10 Mg Tabs (Rosuvastatin Calcium) .... Take 1 Tablet By Mouth Once A Day 3)  Xanax 2 Mg Tabs (Alprazolam) .... Take 1 Tablet By Mouth Three Times A Day 4)  Lamictal 200 Mg Tabs (Lamotrigine) .... Take 1 Tablet By Mouth Once A Day 5)  Singulair 10 Mg Tabs (Montelukast Sodium) .Marland KitchenMarland KitchenMarland Kitchen  Take 1 Tablet By Mouth Once A Day 6)  Kapidex 60 Mg Cpdr (Dexlansoprazole) .... Hold 7)  Exforge 10-320 Mg Tabs (Amlodipine Besylate-Valsartan) .... Take 1 Tablet By Mouth Once A Day 8)  Bayer Contour Monitor W/device Kit (Blood Glucose Monitoring Suppl) .... Use Bid 9)  Bayer Contour Test  Strp (Glucose Blood) .... Use Bid 10)  Ventolin Hfa 108 (90 Base) Mcg/act Aers (Albuterol Sulfate) .... Use As Directed 11)  Actos 30 Mg Tabs (Pioglitazone Hcl) .... One By Mouth Once Daily For Diabetes 12)  Metformin Hcl 850 Mg Tabs (Metformin Hcl) .... One By Mouth Two Times A Day For Diabetes 13)  Feosol 200 (65 Fe) Mg Tabs (Ferrous Sulfate Dried) .... One By Mouth Two Times A Day With Food 14)  Nortryptilline .[name].. 3 By Mouth At Bedtime 15)  Nitrostat 0.4 Mg Subl (Nitroglycerin) .... One Every 5 Mins Under Tongue For Chest Pain Up To 3 Times.  If Not Resolved Call 911 16)  Isosorbide Mononitrate Cr 60 Mg Xr24h-Tab (Isosorbide Mononitrate) .... 1/2 Daily 17)  Meclizine Hcl 25 Mg Tabs (Meclizine Hcl) .... Take 1 Tablet By Mouth Three Times A Day 18)  Sulfamethoxazole-Tmp Ds 800-160 Mg Tabs (Sulfamethoxazole-Trimethoprim) .... Take 1 Tablet By Mouth Two Times A Day X 5 Days  Allergies (verified): 1)  ! Penicillin  Past History:  Past Medical History: Last updated: 06/24/2010 Anemia-NOS Depression Diabetes mellitus, type II GERD Hyperlipidemia Hypertension Sleep apnea (CPAP) Asthma Hemorrhoids  Past Surgical History: Last updated: 06/24/2010 Benign tumors resected Tubal ligation  Family  History: Last updated: 06/24/2010 Family History of Alcoholism/Addiction Family History Hypertension Family History of Cardiovascular disorder (A brother had coronary artery disease dying at age 39. Her mother died at 37 with alcohol abuse and a myocardial infarction)  Social History: Last updated: 05/06/2009 Married Never Smoked Alcohol use-no Drug use-no Regular exercise-no Disabled  Risk Factors: Alcohol Use: 0 (10/17/2010) Exercise: no (05/06/2009)  Risk Factors: Smoking Status: never (10/17/2010)  Family History: Reviewed history from 06/24/2010 and no changes required. Family History of Alcoholism/Addiction Family History Hypertension Family History of Cardiovascular disorder (A brother had coronary artery disease dying at age 30. Her mother died at 30 with alcohol abuse and a myocardial infarction)  Social History: Reviewed history from 05/06/2009 and no changes required. Married Never Smoked Alcohol use-no Drug use-no Regular exercise-no Disabled Sexual History:  currently monogamous Blood Transfusions:  no  Review of Systems       The patient complains of weight gain.  The patient denies anorexia, fever, weight loss, hoarseness, chest pain, syncope, dyspnea on exertion, peripheral edema, prolonged cough, headaches, hemoptysis, abdominal pain, melena, hematochezia, severe indigestion/heartburn, hematuria, suspicious skin lesions, difficulty walking, abnormal bleeding, enlarged lymph nodes, and breast masses.    Physical Exam  General:  alert, well-developed, well-nourished, well-hydrated, cooperative to examination, good hygiene, and overweight-appearing.   Head:  normocephalic, atraumatic, no abnormalities observed, and no abnormalities palpated.   Mouth:  Oral mucosa and oropharynx without lesions or exudates.  Teeth in good repair. Neck:  supple, full ROM, no masses, no carotid bruits, no cervical lymphadenopathy, and no neck tenderness.   Lungs:  Normal  respiratory effort, chest expands symmetrically. Lungs are clear to auscultation, no crackles or wheezes. Heart:  Normal rate and regular rhythm. S1 and S2 normal without gallop, murmur, click, rub or other extra sounds. Abdomen:  soft, non-tender, normal bowel sounds, no distention, no masses, no guarding, no hepatomegaly, and no splenomegaly.   Msk:  normal ROM, no  joint tenderness, no joint swelling, no joint warmth, no redness over joints, no joint deformities, no joint instability, and no crepitation.   Pulses:  R and L carotid,radial,femoral,dorsalis pedis and posterior tibial pulses are full and equal bilaterally Extremities:  No clubbing, cyanosis, edema, or deformity noted with normal full range of motion of all joints.   Neurologic:  No cranial nerve deficits noted. Station and gait are normal. Plantar reflexes are down-going bilaterally. DTRs are symmetrical throughout. Sensory, motor and coordinative functions appear intact. Skin:  turgor normal, color normal, no rashes, no suspicious lesions, no ecchymoses, no petechiae, no purpura, no ulcerations, and no edema.   Cervical Nodes:  no anterior cervical adenopathy and no posterior cervical adenopathy.   Axillary Nodes:  no R axillary adenopathy and no L axillary adenopathy.   Psych:  Oriented X3, memory intact for recent and remote, normally interactive, not anxious appearing, not agitated, not suicidal, not homicidal, dysphoric affect, and subdued.    Diabetes Management Exam:    Foot Exam (with socks and/or shoes not present):       Sensory-Pinprick/Light touch:          Left medial foot (L-4): normal          Left dorsal foot (L-5): normal          Left lateral foot (S-1): normal          Right medial foot (L-4): normal          Right dorsal foot (L-5): normal          Right lateral foot (S-1): normal       Sensory-Monofilament:          Left foot: normal          Right foot: normal       Inspection:          Left foot:  normal          Right foot: normal       Nails:          Left foot: normal          Right foot: normal   Impression & Recommendations:  Problem # 1:  HYPERTENSION (ICD-401.9) Assessment Improved  Her updated medication list for this problem includes:    Coreg 25 Mg Tabs (Carvedilol) .[name]... 1 by mouth two times a day    Exforge 10-320 Mg Tabs (Amlodipine besylate-valsartan) .[name]... Take 1 tablet by mouth once a day  Orders: Venipuncture (16109) TLB-BMP (Basic Metabolic Panel-BMET) (80048-METABOL) TLB-CBC Platelet - w/Differential (85025-CBCD) TLB-Hepatic/Liver Function Pnl (80076-HEPATIC) TLB-TSH (Thyroid Stimulating Hormone) (84443-TSH) TLB-A1C / Hgb A1C (Glycohemoglobin) (83036-A1C) TLB-Microalbumin/Creat Ratio, Urine (82043-MALB) TLB-Udip w/ Micro (81001-URINE)  BP today: 122/70 Prior BP: 120/70 (07/18/2010)  Prior 10 Yr Risk Heart Disease: Not enough information (05/06/2009)  Labs Reviewed: K+: 4.6 (05/07/2010) Creat: : 0.8 (05/07/2010)   Chol: 172 (07/09/2010)   HDL: 43.10 (07/09/2010)   TG: 435.0 (07/09/2010)  Problem # 2:  DIABETES MELLITUS, TYPE II (ICD-250.00) Assessment: Unchanged  Her updated medication list for this problem includes:    Exforge 10-320 Mg Tabs (Amlodipine besylate-valsartan) .[name]... Take 1 tablet by mouth once a day    Actos 30 Mg Tabs (Pioglitazone hcl) ..... One by mouth once daily for diabetes    Metformin Hcl 850 Mg Tabs (Metformin hcl) ..... One by mouth two times a day for diabetes  Orders: Venipuncture (60454) TLB-BMP (Basic Metabolic Panel-BMET) (80048-METABOL) TLB-CBC Platelet - w/Differential (85025-CBCD) TLB-Hepatic/Liver Function Pnl (80076-HEPATIC) TLB-TSH (  Thyroid Stimulating Hormone) (84443-TSH) TLB-A1C / Hgb A1C (Glycohemoglobin) (83036-A1C) TLB-Microalbumin/Creat Ratio, Urine (82043-MALB) TLB-Udip w/ Micro (81001-URINE) Ophthalmology Referral (Ophthalmology)  Labs Reviewed: Creat: 0.8 (05/07/2010)     Last Eye Exam:  normal (02/20/2009) Reviewed HgBA1c results: 5.7 (05/07/2010)  6.0 (02/05/2010)  Problem # 3:  HYPERLIPIDEMIA (ICD-272.4) Assessment: Unchanged  Her updated medication list for this problem includes:    Crestor 10 Mg Tabs (Rosuvastatin calcium) .[name]... Take 1 tablet by mouth once a day  Orders: Venipuncture (16109) TLB-BMP (Basic Metabolic Panel-BMET) (80048-METABOL) TLB-CBC Platelet - w/Differential (85025-CBCD) TLB-Hepatic/Liver Function Pnl (80076-HEPATIC) TLB-TSH (Thyroid Stimulating Hormone) (84443-TSH) TLB-A1C / Hgb A1C (Glycohemoglobin) (83036-A1C) TLB-Microalbumin/Creat Ratio, Urine (82043-MALB) TLB-Udip w/ Micro (81001-URINE)  Labs Reviewed: SGOT: 39 (07/09/2010)   SGPT: 29 (07/09/2010)  Lipid Goals: Chol Goal: 200 (12/27/2009)   HDL Goal: 40 (12/27/2009)   LDL Goal: 100 (12/27/2009)   TG Goal: 150 (12/27/2009)  Prior 10 Yr Risk Heart Disease: Not enough information (05/06/2009)   HDL:43.10 (07/09/2010), 40.20 (05/06/2009)  Chol:172 (07/09/2010), 235 (05/06/2009)  Trig:435.0 (07/09/2010), 342.0 (05/06/2009)  Complete Medication List: 1)  Coreg 25 Mg Tabs (Carvedilol) .[name].. 1 by mouth two times a day 2)  Crestor 10 Mg Tabs (Rosuvastatin calcium) .... Take 1 tablet by mouth once a day 3)  Xanax 2 Mg Tabs (Alprazolam) .... Take 1 tablet by mouth three times a day 4)  Lamictal 200 Mg Tabs (Lamotrigine) .... Take 1 tablet by mouth once a day 5)  Singulair 10 Mg Tabs (Montelukast sodium) .... Take 1 tablet by mouth once a day 6)  Kapidex 60 Mg Cpdr (Dexlansoprazole) .... Hold 7)  Exforge 10-320 Mg Tabs (Amlodipine besylate-valsartan) .... Take 1 tablet by mouth once a day 8)  Bayer Contour Monitor W/device Kit (Blood glucose monitoring suppl) .... Use bid 9)  Bayer Contour Test Strp (Glucose blood) .... Use bid 10)  Ventolin Hfa 108 (90 Base) Mcg/act Aers (Albuterol sulfate) .... Use as directed 11)  Actos 30 Mg Tabs (Pioglitazone hcl) .... One by mouth once daily for  diabetes 12)  Metformin Hcl 850 Mg Tabs (Metformin hcl) .... One by mouth two times a day for diabetes 13)  Feosol 200 (65 Fe) Mg Tabs (Ferrous sulfate dried) .... One by mouth two times a day with food 14)  Nortryptilline  .[name].. 3 by mouth at bedtime 15)  Nitrostat 0.4 Mg Subl (Nitroglycerin) .... One every 5 mins under tongue for chest pain up to 3 times.  if not resolved call 911 16)  Isosorbide Mononitrate Cr 60 Mg Xr24h-tab (Isosorbide mononitrate) .... 1/2 daily 17)  Meclizine Hcl 25 Mg Tabs (Meclizine hcl) .... Take 1 tablet by mouth three times a day 18)  Sulfamethoxazole-tmp Ds 800-160 Mg Tabs (Sulfamethoxazole-trimethoprim) .... Take 1 tablet by mouth two times a day x 5 days  Other Orders: Radiology Referral (Radiology)  Lipid Assessment/Plan:      Based on NCEP/ATP III, the patient's risk factor category is "history of diabetes".  The patient's lipid goals are as follows: Total cholesterol goal is 200; LDL cholesterol goal is 100; HDL cholesterol goal is 40; Triglyceride goal is 150.    PAP Screening:    Last PAP smear:  05/22/2003    Reviewed PAP smear recommendations:  patient refuses understanding risks of delayed diagnosis  Mammogram Screening:    Last Mammogram:  05/22/2009    Reviewed Mammogram recommendations:  mammogram ordered  Osteoporosis Risk Assessment:  Risk Factors for Fracture or Low Bone Density:   Smoking status:  never  Immunization & Chemoprophylaxis:    Tetanus vaccine: Tdap  (02/05/2010)    Influenza vaccine: Fluvax 3+  (09/04/2010)   Patient Instructions: 1)  Please schedule a follow-up appointment in 3 months. 2)  It is important that you exercise regularly at least 20 minutes 5 times a week. If you develop chest pain, have severe difficulty breathing, or feel very tired , stop exercising immediately and seek medical attention. 3)  You need to lose weight. Consider a lower calorie diet and regular exercise.  4)  Schedule your mammogram. 5)   Schedule a colonoscopy/sigmoidoscopy to help detect colon cancer. 6)  You need to have a Pap Smear to prevent cervical cancer. 7)  Check your blood sugars regularly. If your readings are usually above 200 or below 70 you should contact our office. 8)  It is important that your Diabetic A1c level is checked every 3 months. 9)  See your eye doctor yearly to check for diabetic eye damage. 10)  Check your feet each night for sore areas, calluses or signs of infection. 11)  Check your Blood Pressure regularly. If it is above 130/80: you should make an appointment.   Orders Added: 1)  Radiology Referral [Radiology] 2)  Venipuncture [36415] 3)  TLB-BMP (Basic Metabolic Panel-BMET) [80048-METABOL] 4)  TLB-CBC Platelet - w/Differential [85025-CBCD] 5)  TLB-Hepatic/Liver Function Pnl [80076-HEPATIC] 6)  TLB-TSH (Thyroid Stimulating Hormone) [84443-TSH] 7)  TLB-A1C / Hgb A1C (Glycohemoglobin) [83036-A1C] 8)  TLB-Microalbumin/Creat Ratio, Urine [82043-MALB] 9)  TLB-Udip w/ Micro [81001-URINE] 10)  Ophthalmology Referral [Ophthalmology] 11)  Est. Patient Level III [16109]

## 2011-01-15 NOTE — Letter (Signed)
Summary: Results Follow-up Letter  [facility]  [address] [place], [place] [address]   Phone: [phone]  Fax: [phone]    05/06/2009  [address] [place], [place]  [address]  Dear Ms. Younan,   The following are the results of your recent test(s):  Test     Result     Blood sugar     high, 142 Kidney     normal CBC       mild anemia Liver       normal Iron/B12     normal A1C       6.7, good control of diabetes Urine       normal Thyroid     normal _________________________________________________________  Please call for an appointment as directed _________________________________________________________ _________________________________________________________ _________________________________________________________  Sincerely,  Sanda Linger MD [facility]

## 2011-01-15 NOTE — Medication Information (Signed)
Summary: Actos & Singulair/[employer]  Actos & Singulair/[employer]   Imported By: Sherian Rein 02/27/2010 15:08:47  _____________________________________________________________________  External Attachment:    Type:   Image     Comment:   External Document

## 2011-01-15 NOTE — Progress Notes (Signed)
Summary: rx  Phone Note From Pharmacy   Caller: [facility] DrMarland Kitchen Summary of Call: Per pharmacy Relion Ventolin 108 product is no longer available. They are requesting a new rx for albuterol HFA inhaler, please advise if ok. Thanks Initial call taken by: Rock Nephew CMA,  September 22, 2010 8:40 AM    Prescriptions: VENTOLIN HFA 108 (90 BASE) MCG/ACT AERS (ALBUTEROL SULFATE) use as directed  #14mo x 11   Entered and Authorized by:   Etta Grandchild MD   Signed by:   Etta Grandchild MD on 09/22/2010   Method used:   Electronically to        [facility] (retail)       [address]       [place], [place]  [address]       Ph: [phone]       Fax: [phone]   RxID:   [id number]

## 2011-01-15 NOTE — Medication Information (Signed)
Summary: Prior Auth for Singulair/[employer]  Prior Auth for Singulair/[employer]   Imported By: Sherian Rein 02/27/2010 08:29:10  _____________________________________________________________________  External Attachment:    Type:   Image     Comment:   External Document

## 2011-01-15 NOTE — Medication Information (Signed)
Summary: Valerie [name] for Metformin/[employer]  [employer] for Metformin/[employer]   Imported By: Sherian Rein 11/17/2010 11:06:53  _____________________________________________________________________  External Attachment:    Type:   Image     Comment:   External Document

## 2011-01-15 NOTE — Progress Notes (Signed)
Summary: Nuclear Pre-Procedure  Phone Note Outgoing Call Call back at [facility] Phone [phone]   Call placed by: Stanton Kidney, EMT-P,  July 08, 2010 3:19 PM Call placed to: Patient Action Taken: Phone Call Completed Summary of Call: Reviewed information on [employer] Information Sheet (see scanned document for further details).  Spoke with Patient.    Nuclear Med Background Indications for Stress Test: Evaluation for Ischemia   History: Asthma, Heart Catheterization, Myocardial Perfusion Study  History Comments: '07 Heart Cath: EF=70%, NL coronaries, mild N/O plaque '08 MPS: (-) ischemia/infarct  Symptoms: Chest Pain, Chest Pain with Exertion, Chest Pressure, Chest Pressure with Exertion, Diaphoresis, Dizziness, Nausea, SOB    Nuclear Pre-Procedure Cardiac Risk Factors: Family History - CAD, Hypertension, Lipids, NIDDM Height (in): 66
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
## 2011-01-15 NOTE — Assessment & Plan Note (Signed)
Summary: 4 MTH FU---STC   Vital Signs:  Patient profile:   65 year old female Height:      66 inches Weight:      264 pounds BMI:     42.76 O2 Sat:      97 % on Room air Temp:     98.0 degrees F oral Pulse rate:   91 / minute Pulse rhythm:   regular Resp:     18 per minute BP sitting:   140 / 82  (left arm) Cuff size:   large  Vitals Entered By: Estell Harpin CMA (May 07, 2010 2:40 PM)  Nutrition Counseling: Patient's BMI is greater than 25 and therefore counseled on weight management options.  O2 Flow:  Room air CC: SOB, chest pain, crying spells, Depressive symptoms, Lipid Management, Abdominal Pain Is Patient Diabetic? No Pain Assessment Patient in pain? yes     Location: chest Intensity: 2 Type: heaviness Onset of pain  Intermittent   Primary Care Provider:  Janith Lima MD  CC:  SOB, chest pain, crying spells, Depressive symptoms, Lipid Management, and Abdominal Pain.  History of Present Illness:       This is a 65 year old female who presents with Chest pain.  The symptoms began 2 days ago.  On a scale of 1 to 10, the intensity is described as a 2.  The patient reports resting chest pain and exertional chest pain, but denies nausea, vomiting, diaphoresis, shortness of breath, palpitations, dizziness, light headedness, syncope, and indigestion.  The pain is described as intermittent and pressure-like.  The pain is located in the substernal area and the pain does not radiate.  Episodes of chest pain last 2-5 minutes.  The pain is brought on or made worse by emotional stress.    Depressive Symptoms      The patient also presents with Depressive symptoms.  The symptoms began 3 weeks ago.  The severity is described as mild.  The patient reports depressed mood and loss of interest/pleasure, but denies significant weight loss, significant weight gain, insomnia, hypersomnia, psychomotor agitation, and psychomotor retardation.  The patient also reports fatigue or loss of  energy, feelings of worthlessness, and indecisiveness.  The patient denies thoughts of death, thoughts of suicide, suicidal intent, and suicidal plans.  The patient reports the following psychosocial stressors: recent traumatic event and major life changes.  Patient's past history includes depression.  The patient denies abnormally elevated mood, abnormally irritable mood, decreased need for sleep, increased talkativeness, distractibility, flight of ideas, increased goal-directed activity, and inflated self-esteem/ grandiosity.    Dyspepsia History:      She has no alarm features of dyspepsia including no history of melena, hematochezia, dysphagia, persistent vomiting, or involuntary weight loss > 5%.  There is a prior history of GERD.  The patient does not have a prior history of documented ulcer disease.  The dominant symptom is heartburn or acid reflux.  An H-2 blocker medication is currently being taken.  She notes that the symptoms have improved with the H-2 blocker therapy.  Symptoms have not persisted after 4 weeks of H-2 blocker treatment.    Lipid Management History:      Positive NCEP/ATP III risk factors include female age 72 years old or older, diabetes, and hypertension.  Negative NCEP/ATP III risk factors include no family history for ischemic heart disease, non-tobacco-user status, no ASHD (atherosclerotic heart disease), no prior stroke/TIA, no peripheral vascular disease, and no history of aortic aneurysm.  The patient states that she knows about the "Therapeutic Lifestyle Change" diet.  Her compliance with the TLC diet is not at all.  The patient expresses understanding of adjunctive measures for cholesterol lowering.  Adjunctive measures started by the patient include limit alcohol consumpton.  She expresses no side effects from her lipid-lowering medication.  The patient denies any symptoms to suggest myopathy or liver disease.     Preventive Screening-Counseling &  Management  Alcohol-Tobacco     Alcohol drinks/day: 0     Smoking Status: never  Hep-HIV-STD-Contraception     Hepatitis Risk: no risk noted     HIV Risk: no risk noted     STD Risk: no risk noted      Drug Use:  no.    Clinical Review Panels:  Lipid Management   Cholesterol:  235 (05/06/2009)   HDL (good cholesterol):  40.20 (05/06/2009)  Diabetes Management   HgBA1C:  6.0 (02/05/2010)   Creatinine:  0.8 (02/05/2010)   Last Dilated Eye Exam:  normal (02/20/2009)   Last Foot Exam:  yes (05/07/2010)   Last Flu Vaccine:  Fluvax 3+ (09/25/2009)  CBC   WBC:  10.5 (02/05/2010)   RBC:  3.35 (02/05/2010)   Hgb:  8.3 L g/dL (02/05/2010)   Hct:  26.0 L % (02/05/2010)   Platelets:  380.0 (02/05/2010)   MCV  77.7 (02/05/2010)   MCHC  31.8 (02/05/2010)   RDW  19.4 H % (02/05/2010)   PMN:  59.4 (02/05/2010)   Lymphs:  29.2 (02/05/2010)   Monos:  8.6 (02/05/2010)   Eosinophils:  1.8 (02/05/2010)   Basophil:  1.0 (02/05/2010)  Complete Metabolic Panel   Glucose:  100 (02/05/2010)   Sodium:  141 (02/05/2010)   Potassium:  4.2 (02/05/2010)   Chloride:  104 (02/05/2010)   CO2:  29 (02/05/2010)   BUN:  7 (02/05/2010)   Creatinine:  0.8 (02/05/2010)   Albumin:  4.0 (02/05/2010)   Total Protein:  7.2 (02/05/2010)   Calcium:  9.3 (02/05/2010)   Total Bili:  0.3 (02/05/2010)   Alk Phos:  76 (02/05/2010)   SGPT (ALT):  20 (02/05/2010)   SGOT (AST):  24 (02/05/2010)   Medications Prior to Update: 1)  Coreg 25 Mg Tabs (Carvedilol) .[name].. 1 By Mouth Two Times A Day 2)  Crestor 10 Mg Tabs (Rosuvastatin Calcium) .... Take 1 Tablet By Mouth Once A Day 3)  Prozac 20 Mg Caps (Fluoxetine Hcl) .... Take 1 Tablet By Mouth Three Times A Day 4)  Xanax 2 Mg Tabs (Alprazolam) .... Take 1 Tablet By Mouth Three Times A Day 5)  Lamictal 200 Mg Tabs (Lamotrigine) .... Take 1 Tablet By Mouth Once A Day 6)  Singulair 10 Mg Tabs (Montelukast Sodium) .... Take 1 Tablet By Mouth Once A Day 7)  Kapidex  60 Mg Cpdr (Dexlansoprazole) .... Once Daily For Acid Reflux 8)  Exforge 10-320 Mg Tabs (Amlodipine Besylate-Valsartan) .... Take 1 Tablet By Mouth Once A Day 9)  Bayer Contour Monitor W/device Kit (Blood Glucose Monitoring Suppl) .... Use Bid 10)  Bayer Contour Test  Strp (Glucose Blood) .... Use Bid 11)  Ventolin Hfa 108 (90 Base) Mcg/act Aers (Albuterol Sulfate) .... Use As Directed 12)  Actos 30 Mg Tabs (Pioglitazone Hcl) .... One By Mouth Once Daily For Diabetes 13)  Metformin Hcl 850 Mg Tabs (Metformin Hcl) .... One By Mouth Two Times A Day For Diabetes 14)  Feosol 200 (65 Fe) Mg Tabs (Ferrous Sulfate Dried) .... One By  Mouth Two Times A Day With Food  Current Medications (verified): 1)  Coreg 25 Mg Tabs (Carvedilol) .[name].. 1 By Mouth Two Times A Day 2)  Crestor 10 Mg Tabs (Rosuvastatin Calcium) .... Take 1 Tablet By Mouth Once A Day 3)  Prozac 20 Mg Caps (Fluoxetine Hcl) .... Take 1 Tablet By Mouth Three Times A Day 4)  Xanax 2 Mg Tabs (Alprazolam) .... Take 1 Tablet By Mouth Three Times A Day 5)  Lamictal 200 Mg Tabs (Lamotrigine) .... Take 1 Tablet By Mouth Once A Day 6)  Singulair 10 Mg Tabs (Montelukast Sodium) .... Take 1 Tablet By Mouth Once A Day 7)  Kapidex 60 Mg Cpdr (Dexlansoprazole) .... Once Daily For Acid Reflux 8)  Exforge 10-320 Mg Tabs (Amlodipine Besylate-Valsartan) .... Take 1 Tablet By Mouth Once A Day 9)  Bayer Contour Monitor W/device Kit (Blood Glucose Monitoring Suppl) .... Use Bid 10)  Bayer Contour Test  Strp (Glucose Blood) .... Use Bid 11)  Ventolin Hfa 108 (90 Base) Mcg/act Aers (Albuterol Sulfate) .... Use As Directed 12)  Actos 30 Mg Tabs (Pioglitazone Hcl) .... One By Mouth Once Daily For Diabetes 13)  Metformin Hcl 850 Mg Tabs (Metformin Hcl) .... One By Mouth Two Times A Day For Diabetes 14)  Feosol 200 (65 Fe) Mg Tabs (Ferrous Sulfate Dried) .... One By Mouth Two Times A Day With Food  Allergies (verified): 1)  ! Penicillin  Past History:  Past  Medical History: Reviewed history from 05/06/2009 and no changes required. Anemia-NOS Depression Diabetes mellitus, type II GERD Hyperlipidemia Hypertension  Past Surgical History: Reviewed history from 05/06/2009 and no changes required. Hemorrhoidectomy  Family History: Reviewed history from 05/06/2009 and no changes required. Family History of Alcoholism/Addiction Family History Hypertension Family History of Cardiovascular disorder  Social History: Reviewed history from 05/06/2009 and no changes required. Married Never Smoked Alcohol use-no Drug use-no Regular exercise-no Disabled Hepatitis Risk:  no risk noted HIV Risk:  no risk noted STD Risk:  no risk noted  Review of Systems       The patient complains of depression.  The patient denies anorexia, fever, weight loss, weight gain, syncope, dyspnea on exertion, peripheral edema, prolonged cough, headaches, hemoptysis, abdominal pain, and difficulty walking.   CV:  Complains of chest pain or discomfort; denies bluish discoloration of lips or nails, fainting, fatigue, leg cramps with exertion, lightheadness, near fainting, palpitations, shortness of breath with exertion, swelling of feet, swelling of hands, and weight gain. Endo:  Denies cold intolerance, excessive hunger, excessive thirst, excessive urination, heat intolerance, polyuria, and weight change. Heme:  Denies abnormal bruising, bleeding, enlarge lymph nodes, fevers, pallor, and skin discoloration.  Physical Exam  General:  alert, well-developed, well-nourished, well-hydrated, cooperative to examination, good hygiene, and overweight-appearing.   Head:  normocephalic, atraumatic, no abnormalities observed, and no abnormalities palpated.   Mouth:  Oral mucosa and oropharynx without lesions or exudates.  Teeth in good repair. Neck:  supple, full ROM, no masses, no carotid bruits, no cervical lymphadenopathy, and no neck tenderness.   Lungs:  Normal respiratory  effort, chest expands symmetrically. Lungs are clear to auscultation, no crackles or wheezes. Heart:  Normal rate and regular rhythm. S1 and S2 normal without gallop, murmur, click, rub or other extra sounds. Abdomen:  soft, non-tender, normal bowel sounds, no distention, no masses, no guarding, no hepatomegaly, and no splenomegaly.   Msk:  No deformity or scoliosis noted of thoracic or lumbar spine.   Pulses:  R and L  carotid,radial,femoral,dorsalis pedis and posterior tibial pulses are full and equal bilaterally Extremities:  No clubbing, cyanosis, edema, or deformity noted with normal full range of motion of all joints.   Neurologic:  No cranial nerve deficits noted. Station and gait are normal. Plantar reflexes are down-going bilaterally. DTRs are symmetrical throughout. Sensory, motor and coordinative functions appear intact. Skin:  turgor normal, color normal, no rashes, no suspicious lesions, no ecchymoses, no petechiae, no purpura, no ulcerations, and no edema.   Cervical Nodes:  no anterior cervical adenopathy and no posterior cervical adenopathy.   Axillary Nodes:  no R axillary adenopathy and no L axillary adenopathy.   Psych:  Oriented X3, memory intact for recent and remote, normally interactive, good eye contact, not agitated, not suicidal, depressed affect, tearful, and slightly anxious.   Additional Exam:  EKG shows NSR with flat t waves in V1 and V2 but no q waves and no st chnages or q waves.  Diabetes Management Exam:    Foot Exam (with socks and/or shoes not present):       Sensory-Pinprick/Light touch:          Left medial foot (L-4): normal          Left dorsal foot (L-5): normal          Left lateral foot (S-1): normal          Right medial foot (L-4): normal          Right dorsal foot (L-5): normal          Right lateral foot (S-1): normal       Sensory-Monofilament:          Left foot: normal          Right foot: normal       Inspection:          Left foot: normal           Right foot: normal       Nails:          Left foot: normal          Right foot: normal   Impression & Recommendations:  Problem # 1:  CHEST PAIN (ICD-786.50) Assessment New CP sounds stress related, she had normal cardiac cath. in 2008. will check a CK-MB but I don't think this is angina. will check a Chest xray for aortic enlargement, pneumothorax, etc. Orders: T-2 View CXR (71020TC) Venipuncture [id number]) TLB-BMP (Basic Metabolic Panel-BMET) (99991111) TLB-CBC Platelet - w/Differential (85025-CBCD) TLB-Hepatic/Liver Function Pnl (80076-HEPATIC) TLB-TSH (Thyroid Stimulating Hormone) (84443-TSH) TLB-CK-MB (Creatine Kinase MB) (82553-CKMB) TLB-A1C / Hgb A1C (Glycohemoglobin) (83036-A1C) T-D-Dimer Fibrin Derivatives Quantitive [id number]) EKG w/ Interpretation (93000)  Problem # 2:  HYPERTENSION (ICD-401.9) Assessment: Improved  Her updated medication list for this problem includes:    Coreg 25 Mg Tabs (Carvedilol) .[name]... 1 by mouth two times a day    Exforge 10-320 Mg Tabs (Amlodipine besylate-valsartan) .[name]... Take 1 tablet by mouth once a day  Orders: Venipuncture [id number]) TLB-BMP (Basic Metabolic Panel-BMET) (99991111) TLB-CBC Platelet - w/Differential (85025-CBCD) TLB-Hepatic/Liver Function Pnl (80076-HEPATIC) TLB-TSH (Thyroid Stimulating Hormone) (84443-TSH) TLB-CK-MB (Creatine Kinase MB) (82553-CKMB) TLB-A1C / Hgb A1C (Glycohemoglobin) (83036-A1C) T-D-Dimer Fibrin Derivatives Quantitive [phone])  BP today: 140/82 Prior BP: 144/84 (02/14/2010)  Prior 10 Yr Risk Heart Disease: Not enough information (05/06/2009)  Labs Reviewed: K+: 4.2 (02/05/2010) Creat: : 0.8 (02/05/2010)   Chol: 235 (05/06/2009)   HDL: 40.20 (05/06/2009)   TG: 342.0 (05/06/2009)  Problem # 3:  DIABETES MELLITUS, TYPE  II (ICD-250.00) Assessment: Improved  Her updated medication list for this problem includes:    Exforge 10-320 Mg Tabs (Amlodipine besylate-valsartan) .[name]...  Take 1 tablet by mouth once a day    Actos 30 Mg Tabs (Pioglitazone hcl) ..... One by mouth once daily for diabetes    Metformin Hcl 850 Mg Tabs (Metformin hcl) ..... One by mouth two times a day for diabetes  Orders: Venipuncture [id number]) TLB-BMP (Basic Metabolic Panel-BMET) (99991111) TLB-CBC Platelet - w/Differential (85025-CBCD) TLB-Hepatic/Liver Function Pnl (80076-HEPATIC) TLB-TSH (Thyroid Stimulating Hormone) (84443-TSH) TLB-CK-MB (Creatine Kinase MB) (82553-CKMB) TLB-A1C / Hgb A1C (Glycohemoglobin) (83036-A1C) T-D-Dimer Fibrin Derivatives Quantitive [phone])  Labs Reviewed: Creat: 0.8 (02/05/2010)     Last Eye Exam: normal (02/20/2009) Reviewed HgBA1c results: 6.0 (02/05/2010)  7.4 (10/25/2009)  Problem # 4:  DEPRESSION (ICD-311) Assessment: Deteriorated  Her updated medication list for this problem includes:    Prozac 20 Mg Caps (Fluoxetine hcl) .[name]... Take 1 tablet by mouth three times a day    Xanax 2 Mg Tabs (Alprazolam) .[name]... Take 1 tablet by mouth three times a day  Discussed treatment options, including trial of antidpressant medication. Will refer to behavioral health. Follow-up call in in 24-48 hours and recheck in 2 weeks, sooner as needed. Patient agrees to call if any worsening of symptoms or thoughts of doing harm arise. Verified that the patient has no suicidal ideation at this time.   Problem # 5:  ANEMIA-NOS (N067566.9) Assessment: Unchanged  Her updated medication list for this problem includes:    Feosol 200 (65 Fe) Mg Tabs (Ferrous sulfate dried) ..... One by mouth two times a day with food  Orders: Venipuncture [id number]) TLB-BMP (Basic Metabolic Panel-BMET) (99991111) TLB-CBC Platelet - w/Differential (85025-CBCD) TLB-Hepatic/Liver Function Pnl (80076-HEPATIC) TLB-TSH (Thyroid Stimulating Hormone) (84443-TSH) TLB-CK-MB (Creatine Kinase MB) (82553-CKMB) TLB-A1C / Hgb A1C (Glycohemoglobin) (83036-A1C) T-D-Dimer Fibrin Derivatives  Quantitive [id number]) TLB-IBC Pnl (Iron/FE;Transferrin) (83550-IBC)  Hgb: 8.3 L g/dL (02/05/2010)   Hct: 26.0 L % (02/05/2010)   Platelets: 380.0 (02/05/2010) RBC: 3.35 (02/05/2010)   RDW: 19.4 H % (02/05/2010)   WBC: 10.5 (02/05/2010) MCV: 77.7 (02/05/2010)   MCHC: 31.8 (02/05/2010) Iron: 32 (02/05/2010)   % Sat: 6.0 (02/05/2010) B12: 378 (02/05/2010)   Folate: 11.5 (02/05/2010)   TSH: 1.84 (10/25/2009)  Problem # 6:  GERD (ICD-530.81) Assessment: Improved  Her updated medication list for this problem includes:    Kapidex 60 Mg Cpdr (Dexlansoprazole) ..... Once daily for acid reflux  Orders: Venipuncture [id number]) TLB-BMP (Basic Metabolic Panel-BMET) (99991111) TLB-CBC Platelet - w/Differential (85025-CBCD) TLB-Hepatic/Liver Function Pnl (80076-HEPATIC) TLB-TSH (Thyroid Stimulating Hormone) (84443-TSH) TLB-CK-MB (Creatine Kinase MB) (82553-CKMB) TLB-A1C / Hgb A1C (Glycohemoglobin) (83036-A1C) T-D-Dimer Fibrin Derivatives Quantitive [phone])  Labs Reviewed: Hgb: 8.3 L g/dL (02/05/2010)   Hct: 26.0 L % (02/05/2010)  Complete Medication List: 1)  Coreg 25 Mg Tabs (Carvedilol) .[name].. 1 by mouth two times a day 2)  Crestor 10 Mg Tabs (Rosuvastatin calcium) .... Take 1 tablet by mouth once a day 3)  Prozac 20 Mg Caps (Fluoxetine hcl) .... Take 1 tablet by mouth three times a day 4)  Xanax 2 Mg Tabs (Alprazolam) .... Take 1 tablet by mouth three times a day 5)  Lamictal 200 Mg Tabs (Lamotrigine) .... Take 1 tablet by mouth once a day 6)  Singulair 10 Mg Tabs (Montelukast sodium) .... Take 1 tablet by mouth once a day 7)  Kapidex 60 Mg Cpdr (Dexlansoprazole) .... Once daily for acid reflux 8)  Exforge 10-320 Mg Tabs (Amlodipine besylate-valsartan) .Marland KitchenMarland KitchenMarland Kitchen  Take 1 tablet by mouth once a day 9)  Bayer Contour Monitor W/device Kit (Blood glucose monitoring suppl) .... Use bid 10)  Bayer Contour Test Strp (Glucose blood) .... Use bid 11)  Ventolin Hfa 108 (90 Base) Mcg/act Aers  (Albuterol sulfate) .... Use as directed 12)  Actos 30 Mg Tabs (Pioglitazone hcl) .... One by mouth once daily for diabetes 13)  Metformin Hcl 850 Mg Tabs (Metformin hcl) .... One by mouth two times a day for diabetes 14)  Feosol 200 (65 Fe) Mg Tabs (Ferrous sulfate dried) .... One by mouth two times a day with food  Lipid Assessment/Plan:      Based on NCEP/ATP III, the patient's risk factor category is "history of diabetes".  The patient's lipid goals are as follows: Total cholesterol goal is 200; LDL cholesterol goal is 100; HDL cholesterol goal is 40; Triglyceride goal is 150.     Patient Instructions: 1)  Please schedule a follow-up appointment in 2 weeks. 2)  It is important that you exercise regularly at least 20 minutes 5 times a week. If you develop chest pain, have severe difficulty breathing, or feel very tired , stop exercising immediately and seek medical attention. 3)  You need to lose weight. Consider a lower calorie diet and regular exercise.  4)  Check your blood sugars regularly. If your readings are usually above 200  or below 70 you should contact our office. 5)  It is important that your Diabetic A1c level is checked every 3 months. 6)  See your eye doctor yearly to check for diabetic eye damage. 7)  Check your feet each night for sore areas, calluses or signs of infection. 8)  Check your Blood Pressure regularly. If it is above 140/90: you should make an appointment.

## 2011-01-15 NOTE — Progress Notes (Signed)
Summary: Results  Phone Note Call from Patient Call back at Home Phone [phone] Call back at [phone]   Summary of Call: Patient is requesting a call back regarding her results. She recieved letter and has questions.  Initial call taken by: Lamar Sprinkles, CMA,  October 23, 2010 2:02 PM  Follow-up for Phone Call        Explained lab letter. Pt will keep f/u office visit in 3 mth for recheck of liver enzymes. Follow-up by: Lamar Sprinkles, CMA,  October 23, 2010 5:40 PM

## 2011-01-15 NOTE — Medication Information (Signed)
Summary: Diabetes Testing Supplies/[facility]  Diabetes Testing Supp[facility]   Imported By: Sherian Rein 11/19/2009 08:59:45  _____________________________________________________________________  External Attachment:    Type:   Image     Comment:   External Document

## 2011-01-15 NOTE — Progress Notes (Signed)
Summary: BONE DENSITY  Phone Note From Other Clinic   Caller: [facility], phone [phone] Summary of Call:  Tammy from [facility] is req order for bone density Initial call taken by: Lamar Sprinkles,  May 24, 2009 2:13 PM  Follow-up for Phone Call        ok, what is the diagnosis? Follow-up by: Etta Grandchild MD,  May 26, 2009 5:15 PM  Additional Follow-up for Phone Call Additional follow up Details #1::        I called  [facility], phone [phone] spoke with tammy  she stated the pt was in on 05-24-2009 and was inquiring about having a bone density .she does not have a dx. Additional Follow-up by: Shelbie Proctor,  June 05, 2009 10:28 AM

## 2011-01-15 NOTE — Assessment & Plan Note (Signed)
Summary: np6/chest pain/jml   Visit Type:  Follow-up Primary Provider:  Etta Grandchild MD  CC:  chest pain.  History of Present Illness: The patient presents for evaluation of chest discomfort. She has had this for years. In fact she has had cardiac catheterizations in the past. I reviewed these. The last was in 2007 with mild nonobstructive plaque. She had a stress perfusion study in 2008 demonstrating well-preserved ejection fraction without evidence of ischemia or infarct. She presents for reevaluation of this discomfort. This is the same type as discomfort. It is substernal. It happens with exertion and with emotional stress. It is a heaviness like a fist. It goes around to her back and through to her back. It is 10 out of 10 in intensity. It lasts for several minutes. It is associated with sweating, shortness of breath and nausea. She does not describe radiation to her jaw or to her arms. She does think it is getting more intense and limiting her. She sleeps chronically on 2-3 pillows but doesn't describe PND or orthopnea. She does not report palpitations, presyncope or syncope. She has some mild dizziness.  Current Medications (verified): 1)  Coreg 25 Mg Tabs (Carvedilol) .[name].. 1 By Mouth Two Times A Day 2)  Crestor 10 Mg Tabs (Rosuvastatin Calcium) .... Take 1 Tablet By Mouth Once A Day 3)  Xanax 2 Mg Tabs (Alprazolam) .... Take 1 Tablet By Mouth Three Times A Day 4)  Lamictal 200 Mg Tabs (Lamotrigine) .... Take 1 Tablet By Mouth Once A Day 5)  Singulair 10 Mg Tabs (Montelukast Sodium) .... Take 1 Tablet By Mouth Once A Day 6)  Kapidex 60 Mg Cpdr (Dexlansoprazole) .... Hold 7)  Exforge 10-320 Mg Tabs (Amlodipine Besylate-Valsartan) .... Take 1 Tablet By Mouth Once A Day 8)  Bayer Contour Monitor W/device Kit (Blood Glucose Monitoring Suppl) .... Use Bid 9)  Bayer Contour Test  Strp (Glucose Blood) .... Use Bid 10)  Ventolin Hfa 108 (90 Base) Mcg/act Aers (Albuterol Sulfate) .... Use As  Directed 11)  Actos 30 Mg Tabs (Pioglitazone Hcl) .... One By Mouth Once Daily For Diabetes 12)  Metformin Hcl 850 Mg Tabs (Metformin Hcl) .... One By Mouth Two Times A Day For Diabetes 13)  Feosol 200 (65 Fe) Mg Tabs (Ferrous Sulfate Dried) .... One By Mouth Two Times A Day With Food 14)  Nortryptilline .[name].. 3 By Mouth At Bedtime  Allergies (verified): 1)  ! Penicillin  Past History:  Past Medical History: Anemia-NOS Depression Diabetes mellitus, type II GERD Hyperlipidemia Hypertension Sleep apnea (CPAP) Asthma Hemorrhoids  Past Surgical History: Benign tumors resected Tubal ligation  Family History: Family History of Alcoholism/Addiction Family History Hypertension Family History of Cardiovascular disorder (A brother had coronary artery disease dying at age 29. Her mother died at 81 with alcohol abuse and a myocardial infarction)  Review of Systems       Positive for dizziness, cough, reflux. Otherwise as stated in the history of present illness negative for all other systems.  Vital Signs:  Patient profile:   65 year old female Height:      66 inches Weight:      260 pounds BMI:     42.12 Pulse rate:   92 / minute BP sitting:   102 / 68  (right arm)  Vitals Entered By: Marrion Coy, CNA (June 24, 2010 2:37 PM)  Physical Exam  General:  Well developed, well nourished, in no acute distress. Head:  normocephalic and atraumatic Eyes:  PERRLA/EOM intact; conjunctiva and lids normal. Mouth:  Teeth, gums and palate normal. Oral mucosa normal. Neck:  Neck supple, no JVD. No masses, thyromegaly or abnormal cervical nodes. Chest Wall:  no deformities or breast masses noted Lungs:  Clear bilaterally to auscultation and percussion. Abdomen:  Bowel sounds positive; abdomen soft and non-tender without masses, organomegaly, or hernias noted. No hepatosplenomegaly. obese Msk:  Back normal, normal gait. Muscle strength and tone normal. Extremities:  No clubbing or  cyanosis. Neurologic:  Alert and oriented x 3. Skin:  Intact without lesions or rashes. Cervical Nodes:  no significant adenopathy Axillary Nodes:  no significant adenopathy Inguinal Nodes:  no significant adenopathy Psych:  Normal affect.   Detailed Cardiovascular Exam  Neck    Carotids: Carotids full and equal bilaterally without bruits.      Neck Veins: Normal, no JVD.    Heart    Inspection: no deformities or lifts noted.      Palpation: normal PMI with no thrills palpable.      Auscultation: regular rate and rhythm, S1, S2 without murmurs, rubs, gallops, or clicks.    Vascular    Abdominal Aorta: no palpable masses, pulsations, or audible bruits.      Femoral Pulses: normal femoral pulses bilaterally.      Pedal Pulses: normal pedal pulses bilaterally.      Radial Pulses: normal radial pulses bilaterally.      Peripheral Circulation: no clubbing, cyanosis, or edema noted with normal capillary refill.     EKG  Procedure date:  05/07/2010  Findings:      Sinus rhythm, rate 80, axis within normal limits, intervals within normal limits, no acute ST-T wave changes.  Impression & Recommendations:  Problem # 1:  CHEST PAIN (ICD-786.50) The patient has chest pain that has some typical and atypical features. The pretest probability of obstructive coronary disease is at least moderate. Stress perfusion imaging is indicated. I will try to ambulate her on a treadmill if not she can be converted to pharmacologic stress. Orders: Nuclear Stress Test (Nuc Stress Test)  Problem # 2:  HYPERTENSION (ICD-401.9) Her blood pressure is controlled. She will continue the meds as listed.  Problem # 3:  DYSMETABOLIC SYNDROME (ICD-277.7) I reviewed her lipids and did not see any sense May of 2010. The LDL was 133 with an HDL of 40. I would suggest a target LDL less than 100 and HDL greater than 50 with her diabetes. She should have a fasting lipid profile when she comes back for a stress  perfusion study.  Problem # 4:  OBESITY, UNSPECIFIED (ICD-278.00) We discussed the need to stop smoking with diet and exercise.  Patient Instructions: 1)  Your physician recommends that you schedule a follow-up appointment after myoview if needed 2)  Your physician recommends that you continue on your current medications as directed. Please refer to the Current Medication list given to you today. 3)  Your physician has requested that you have an exercise stress myoview.  For further information please visit [email or web address]  Please follow instruction sheet, as given.

## 2011-01-15 NOTE — Progress Notes (Signed)
Summary: Question about Nitro pills  Phone Note Call from Patient Call back at [facility] Phone [phone]   Caller: Patient Details for Reason: Thought she was getting a long acting NTG Summary of Call: Pt have question about Nitro pills Initial call taken by: Judie Grieve,  July 01, 2010 11:32 AM  Follow-up for Phone Call        Pt per - states at her last office visit she understood Dr Antoine Poche to say she needed a long acting nitroglycerin.  Nothing mentioned in the office note, will review with MD and call pt back.  She is in agreement Follow-up by: Charolotte Capuchin, RN,  July 01, 2010 11:46 AM  Additional Follow-up for Phone Call Additional follow up Details #1::        Start Imdur 30 mg by mouth daily.  Stress test pending. Additional Follow-up by: Rollene Rotunda, MD, [facility],  July 01, 2010 5:43 PM    Additional Follow-up for Phone Call Additional follow up Details #2::    pt aware to start Imdur 30 mg.  she will split 60 mg tablets for now. Follow-up by: Charolotte Capuchin, RN,  July 01, 2010 5:49 PM  New/Updated Medications: ISOSORBIDE MONONITRATE CR 60 MG XR24H-TAB (ISOSORBIDE MONONITRATE) one daily ISOSORBIDE MONONITRATE CR 60 MG XR24H-TAB (ISOSORBIDE MONONITRATE) 1/2 daily Prescriptions: ISOSORBIDE MONONITRATE CR 60 MG XR24H-TAB (ISOSORBIDE MONONITRATE) one daily  #30 x 11   Entered by:   Charolotte Capuchin, RN   Authorized by:   Rollene Rotunda, MD, [facility]   Signed by:   Charolotte Capuchin, RN on 07/01/2010   Method used:   Electronically to        [facility] (retail)       [address]       [place], [place]  [address]       Ph: [phone]       Fax: [phone]   RxID:   [id number]

## 2011-01-15 NOTE — Assessment & Plan Note (Signed)
Summary: 1 MO ROV /NWS JULY PER PT REQUEST/NWS $50   Vital Signs:  Patient profile:   65 year old female Height:      66 inches Weight:      262 pounds BMI:     42.44 O2 Sat:      97 % on Room air Temp:     98.7 degrees F oral Pulse rate:   90 / minute Pulse rhythm:   regular BP sitting:   122 / 72  (left arm) Cuff size:   large  Vitals Entered By: Rock Nephew CMA (June 19, 2009 2:06 PM)  O2 Flow:  Room air  Primary Care Provider:  Etta Grandchild MD   History of Present Illness: she returns for followup and offers no complaints. There was some concern she might need a bone mineral density test but the patient reports that she has had several done and there was no evidence of osteopenia or osteoporosis. Since I last saw her she is diabetic and nutrition counseling. She briefly lost some weight and then had some dietary indiscretions and regained the weight. She notices when she eats too much her blood sugar spikes up to 150 or 160.  Dyspepsia History:      She has no alarm features of dyspepsia including no history of melena, hematochezia, dysphagia, persistent vomiting, or involuntary weight loss > 5%.  There is a prior history of GERD.  The patient does not have a prior history of documented ulcer disease.  The dominant symptom is heartburn or acid reflux.  An H-2 blocker medication is currently being taken.  She notes that the symptoms have improved with the H-2 blocker therapy.  Symptoms have not persisted after 4 weeks of H-2 blocker treatment.    Hypertension History:      She denies headache, chest pain, palpitations, dyspnea with exertion, orthopnea, PND, peripheral edema, visual symptoms, neurologic problems, syncope, and side effects from treatment.  She notes no problems with any antihypertensive medication side effects.        Positive major cardiovascular risk factors include female age 32 years old or older, diabetes, hyperlipidemia, and hypertension.  Negative major  cardiovascular risk factors include negative family history for ischemic heart disease and non-tobacco-user status.        Further assessment for target organ damage reveals no history of ASHD, cardiac end-organ damage (CHF/LVH), stroke/TIA, peripheral vascular disease, renal insufficiency, or hypertensive retinopathy.      Current Medications (verified): 1)  Coreg 25 Mg Tabs (Carvedilol) .... Bidtab 2)  Crestor 10 Mg Tabs (Rosuvastatin Calcium) .... Take 1 Tablet By Mouth Once A Day 3)  Prozac 20 Mg Caps (Fluoxetine Hcl) .... Take 1 Tablet By Mouth Three Times A Day 4)  Xanax 2 Mg Tabs (Alprazolam) .... Take 1 Tablet By Mouth Three Times A Day 5)  Lamictal 200 Mg Tabs (Lamotrigine) .... Take 1 Tablet By Mouth Once A Day 6)  Singulair 10 Mg Tabs (Montelukast Sodium) .... Take 1 Tablet By Mouth Once A Day 7)  Kapidex 60 Mg Cpdr (Dexlansoprazole) .... Once Daily For Acid Reflux 8)  Exforge 10-320 Mg Tabs (Amlodipine Besylate-Valsartan) .... Take 1 Tablet By Mouth Once A Day 9)  Metformin Hcl 500 Mg Tabs (Metformin Hcl) .... 1/2 Tab Two Times A Day  Allergies (verified): 1)  ! Penicillin  Past History:  Past Medical History: Reviewed history from 05/06/2009 and no changes required. Anemia-NOS Depression Diabetes mellitus, type II GERD Hyperlipidemia Hypertension  Past  Surgical History: Reviewed history from 05/06/2009 and no changes required. Hemorrhoidectomy  Family History: Reviewed history from 05/06/2009 and no changes required. Family History of Alcoholism/Addiction Family History Hypertension Family History of Cardiovascular disorder  Social History: Reviewed history from 05/06/2009 and no changes required. Married Never Smoked Alcohol use-no Drug use-no Regular exercise-no Disabled  Review of Systems       The patient complains of dyspnea on exertion.  The patient denies chest pain, peripheral edema, abdominal pain, melena, hematochezia, and severe  indigestion/heartburn.    Physical Exam  General:  alert, well-developed, well-nourished, well-hydrated, cooperative to examination, good hygiene, and overweight-appearing.   Mouth:  Oral mucosa and oropharynx without lesions or exudates.  Teeth in good repair. Neck:  supple, full ROM, no masses, no carotid bruits, no cervical lymphadenopathy, and no neck tenderness.   Lungs:  Normal respiratory effort, chest expands symmetrically. Lungs are clear to auscultation, no crackles or wheezes. Heart:  Normal rate and regular rhythm. S1 and S2 normal without gallop, murmur, click, rub or other extra sounds. Abdomen:  soft, non-tender, normal bowel sounds, no distention, no masses, no guarding, no hepatomegaly, and no splenomegaly.   Msk:  No deformity or scoliosis noted of thoracic or lumbar spine.   Extremities:  No clubbing, cyanosis, edema, or deformity noted with normal full range of motion of all joints.   Skin:  turgor normal, color normal, and no rashes.   Psych:  Oriented X3, memory intact for recent and remote, normally interactive, good eye contact, not anxious appearing, not depressed appearing, not agitated, and subdued.    Diabetes Management Exam:    Foot Exam (with socks and/or shoes not present):       Sensory-Pinprick/Light touch:          Left medial foot (L-4): normal          Left dorsal foot (L-5): normal          Left lateral foot (S-1): normal          Right medial foot (L-4): normal          Right dorsal foot (L-5): normal          Right lateral foot (S-1): normal       Sensory-Monofilament:          Left foot: normal          Right foot: normal       Inspection:          Left foot: normal          Right foot: normal       Nails:          Left foot: normal          Right foot: normal   Impression & Recommendations:  Problem # 1:  DIABETES MELLITUS, TYPE II (ICD-250.00) Assessment Unchanged  Her updated medication list for this problem includes:    Exforge  10-320 Mg Tabs (Amlodipine besylate-valsartan) .[name]... Take 1 tablet by mouth once a day    Metformin Hcl 500 Mg Tabs (Metformin hcl) .[name]... 1/2 tab two times a day  Problem # 2:  HYPERTENSION (ICD-401.9) Assessment: Improved  Her updated medication list for this problem includes:    Coreg 25 Mg Tabs (Carvedilol) ..... Bidtab    Exforge 10-320 Mg Tabs (Amlodipine besylate-valsartan) .[name]... Take 1 tablet by mouth once a day  Problem # 3:  GERD (ICD-530.81) Assessment: Improved  Her updated medication list for this problem includes:  Kapidex 60 Mg Cpdr (Dexlansoprazole) ..... Once daily for acid reflux  Complete Medication List: 1)  Coreg 25 Mg Tabs (Carvedilol) .... Bidtab 2)  Crestor 10 Mg Tabs (Rosuvastatin calcium) .... Take 1 tablet by mouth once a day 3)  Prozac 20 Mg Caps (Fluoxetine hcl) .... Take 1 tablet by mouth three times a day 4)  Xanax 2 Mg Tabs (Alprazolam) .... Take 1 tablet by mouth three times a day 5)  Lamictal 200 Mg Tabs (Lamotrigine) .... Take 1 tablet by mouth once a day 6)  Singulair 10 Mg Tabs (Montelukast sodium) .... Take 1 tablet by mouth once a day 7)  Kapidex 60 Mg Cpdr (Dexlansoprazole) .... Once daily for acid reflux 8)  Exforge 10-320 Mg Tabs (Amlodipine besylate-valsartan) .... Take 1 tablet by mouth once a day 9)  Metformin Hcl 500 Mg Tabs (Metformin hcl) .... 1/2 tab two times a day 10)  Designer, multimedia W/device Kit (Blood glucose monitoring suppl) .... Use bid 11)  Bayer Contour Test Strp (Glucose blood) .... Use bid  Hypertension Assessment/Plan:      The patient's hypertensive risk group is category C: Target organ damage and/or diabetes.  Today's blood pressure is 122/72.  Her blood pressure goal is < 130/80.  Patient Instructions: 1)  Please schedule a follow-up appointment in 3 months. 2)  It is important that you exercise regularly at least 20 minutes 5 times a week. If you develop chest pain, have severe difficulty breathing, or feel  very tired , stop exercising immediately and seek medical attention. 3)  You need to lose weight. Consider a lower calorie diet and regular exercise.  4)  Check your blood sugars regularly. If your readings are usually above 150  or below 70 you should contact our office. 5)  It is important that your Diabetic A1c level is checked every 3 months. 6)  See your eye doctor yearly to check for diabetic eye damage. 7)  Check your feet each night for sore areas, calluses or signs of infection. 8)  Check your Blood Pressure regularly. If it is above 130/80: you should make an appointment. Prescriptions: BAYER CONTOUR TEST  STRP (GLUCOSE BLOOD) Use BID  #100 x 0   Entered and Authorized by:   Etta Grandchild MD   Signed by:   Etta Grandchild MD on 06/19/2009   Method used:   Historical   RxID:   [id number] BAYER CONTOUR MONITOR W/DEVICE KIT (BLOOD GLUCOSE MONITORING SUPPL) Use BID  #1 x 0   Entered and Authorized by:   Etta Grandchild MD   Signed by:   Etta Grandchild MD on 06/19/2009   Method used:   Historical   RxID:   [id number] KAPIDEX 60 MG CPDR (DEXLANSOPRAZOLE) once daily for acid reflux  #30 x 11   Entered and Authorized by:   Etta Grandchild MD   Signed by:   Etta Grandchild MD on 06/19/2009   Method used:   Electronically to        [facility] (retail)       [address]       [place], [place]  [address]       Ph: [phone]       Fax: [phone]   RxID:   [id number]

## 2011-01-15 NOTE — Medication Information (Signed)
Summary: Actos Approved/[employer]  Actos Approved/[employer]   Imported By: Sherian Rein 02/27/2010 14:03:44  _____________________________________________________________________  External Attachment:    Type:   Image     Comment:   External Document

## 2011-01-15 NOTE — Progress Notes (Signed)
Summary: Coreg refill  Phone Note Refill Request Message from:  Patient on April 09, 2010 11:53 AM  Refills Requested: Medication #1:  COREG 25 MG TABS bidtab Next Appointment Scheduled: 06-05-10 Initial call taken by: Lucious Groves,  April 09, 2010 11:53 AM    New/Updated Medications: COREG 25 MG TABS (CARVEDILOL) 1 by mouth two times a day Prescriptions: COREG 25 MG TABS (CARVEDILOL) 1 by mouth two times a day  #60 x 3   Entered by:   Lucious Groves   Authorized by:   Etta Grandchild MD   Signed by:   Lucious Groves on 04/09/2010   Method used:   Electronically to        [facility] (retail)       [address]       [place], [place]  [address]       Ph: [phone]       Fax: [phone]   RxID:   [id number]     Allergies: 1)  ! Penicillin

## 2011-01-15 NOTE — Progress Notes (Signed)
Summary: Actos/Singulair PA  Phone Note Other Incoming   Caller: [phone] Summary of Call: The office rec'd note from [facility] that the patient was given temporary prescription for Actos and Singulair. These are step therapy drugs and cannot be done online. They will fax forms. Initial call taken by: Lucious Groves,  February 21, 2010 10:15 AM  Follow-up for Phone Call        Per flag from Maralyn Sago, I tried to call patient with status of PA and phone system would not allow me to call patient home #. Follow-up by: Lucious Groves,  February 25, 2010 10:45 AM  Additional Follow-up for Phone Call Additional follow up Details #1::        prior auth request form completed and faxed. Additional Follow-up by: Daphane Shepherd,  February 25, 2010 1:01 PM     Appended Document: Actos/Singulair PA Singulair and Actos approved until 2013.

## 2011-01-15 NOTE — Progress Notes (Signed)
Summary: RESULTS  Phone Note Call from Patient   Caller: [phone] Summary of Call: Patient is requesting a call regarding lab letter. Initial call taken by: Lamar Sprinkles, CMA,  November 04, 2009 12:07 PM  Follow-up for Phone Call        Lab result letter mailed per pt request Follow-up by: Rock Nephew CMA,  November 04, 2009 3:13 PM

## 2011-01-15 NOTE — Assessment & Plan Note (Signed)
Summary: FLU SHOT-LB  Nurse Visit   Allergies: 1)  ! Penicillin  Orders Added: 1)  Flu Vaccine 1yrs + MEDICARE PATIENTS [Q2039] 2)  Administration Flu vaccine - MCR [G0008]      Flu Vaccine Consent Questions     Do you have a history of severe allergic reactions to this vaccine? no    Any prior history of allergic reactions to egg and/or gelatin? no    Do you have a sensitivity to the preservative Thimersol? no    Do you have a past history of Guillan-Barre Syndrome? no    Do you currently have an acute febrile illness? no    Have you ever had a severe reaction to latex? no    Vaccine information given and explained to patient? yes    Are you currently pregnant? no    Lot Number:[id number]   Exp Date:06/13/2011   Site Given  Left Deltoid IMu

## 2011-01-15 NOTE — Assessment & Plan Note (Signed)
Summary: DISCUSS ANEMIA PER LETTER/NWS  #   Vital Signs:  Patient profile:   65 year old female Height:      66 inches (167.64 cm) Weight:      271.25 pounds (123.30 kg) BMI:     43.94 O2 Sat:      98 % on Room air Temp:     98.2 degrees F (36.78 degrees C) oral Pulse rate:   84 / minute Pulse rhythm:   regular Resp:     16 per minute BP sitting:   144 / 84  (left arm) Cuff size:   large  Vitals Entered By: Rock Nephew CMA (February 14, 2010 2:27 PM)  Nutrition Counseling: Patient's BMI is greater than 25 and therefore counseled on weight management options.  O2 Flow:  Room air CC: Pt states she is here to discuss anemia./aj   Primary Care Provider:  Etta Grandchild MD  CC:  Pt states she is here to discuss anemia./aj.  History of Present Illness: She returns for f/up and to discuss anemia. She feels like it is due to chronic blood loss from intermittent bleeding hemorrhoids. She has seen several surgeons about the hemorrhoids and has had multiple procedures and she does not wish to pursue further treatment at this time. She does not take iron and has no reason not to. She describes a full GI work-up with Dr. Christella Hartigan in 2008. She is sometimes SOB and has had a normal cardiac cath with [facility] in 2008. She also had a normal echo by her report. Her EKG was normal with me 2 months ago.  Asthma History    Asthma Control Assessment:    Age range: 12+ years    Symptoms: 0-2 days/week    Nighttime Awakenings: 0-2/month    Interferes w/ normal activity: no limitations    SABA use (not for EIB): 0-2 days/week    ATAQ questionnaire: 0    Asthma Control Assessment: Well Controlled   Clinical Review Panels:  Prevention   Last Mammogram:  Normal Bilateral (05/22/2009)   Last Pap Smear:  Normal (05/22/2003)   Last Colonoscopy:  Normal (02/23/2007)  Diabetes Management   HgBA1C:  6.0 (02/05/2010)   Creatinine:  0.8 (02/05/2010)   Last Dilated Eye Exam:  normal  (02/20/2009)   Last Foot Exam:  yes (02/14/2010)   Last Flu Vaccine:  Fluvax 3+ (09/25/2009)  CBC   WBC:  10.5 (02/05/2010)   RBC:  3.35 (02/05/2010)   Hgb:  8.3 L g/dL (54/08/8118)   Hct:  14.7 L % (02/05/2010)   Platelets:  380.0 (02/05/2010)   MCV  77.7 (02/05/2010)   MCHC  31.8 (02/05/2010)   RDW  19.4 H % (02/05/2010)   PMN:  59.4 (02/05/2010)   Lymphs:  29.2 (02/05/2010)   Monos:  8.6 (02/05/2010)   Eosinophils:  1.8 (02/05/2010)   Basophil:  1.0 (02/05/2010)  Complete Metabolic Panel   Glucose:  100 (02/05/2010)   Sodium:  141 (02/05/2010)   Potassium:  4.2 (02/05/2010)   Chloride:  104 (02/05/2010)   CO2:  29 (02/05/2010)   BUN:  7 (02/05/2010)   Creatinine:  0.8 (02/05/2010)   Albumin:  4.0 (02/05/2010)   Total Protein:  7.2 (02/05/2010)   Calcium:  9.3 (02/05/2010)   Total Bili:  0.3 (02/05/2010)   Alk Phos:  76 (02/05/2010)   SGPT (ALT):  20 (02/05/2010)   SGOT (AST):  24 (02/05/2010)   Current Medications (verified): 1)  Coreg 25 Mg Tabs (  Carvedilol) .... Bidtab 2)  Crestor 10 Mg Tabs (Rosuvastatin Calcium) .... Take 1 Tablet By Mouth Once A Day 3)  Prozac 20 Mg Caps (Fluoxetine Hcl) .... Take 1 Tablet By Mouth Three Times A Day 4)  Xanax 2 Mg Tabs (Alprazolam) .... Take 1 Tablet By Mouth Three Times A Day 5)  Lamictal 200 Mg Tabs (Lamotrigine) .... Take 1 Tablet By Mouth Once A Day 6)  Singulair 10 Mg Tabs (Montelukast Sodium) .... Take 1 Tablet By Mouth Once A Day 7)  Kapidex 60 Mg Cpdr (Dexlansoprazole) .... Once Daily For Acid Reflux 8)  Exforge 10-320 Mg Tabs (Amlodipine Besylate-Valsartan) .... Take 1 Tablet By Mouth Once A Day 9)  Bayer Contour Monitor W/device Kit (Blood Glucose Monitoring Suppl) .... Use Bid 10)  Bayer Contour Test  Strp (Glucose Blood) .... Use Bid 11)  Ventolin Hfa 108 (90 Base) Mcg/act Aers (Albuterol Sulfate) .... Use As Directed 12)  Actos 30 Mg Tabs (Pioglitazone Hcl) .... One By Mouth Once Daily For Diabetes 13)  Metformin  Hcl 850 Mg Tabs (Metformin Hcl) .... One By Mouth Two Times A Day For Diabetes  Allergies (verified): 1)  ! Penicillin  Past History:  Past Medical History: Reviewed history from 05/06/2009 and no changes required. Anemia-NOS Depression Diabetes mellitus, type II GERD Hyperlipidemia Hypertension  Past Surgical History: Reviewed history from 05/06/2009 and no changes required. Hemorrhoidectomy  Family History: Reviewed history from 05/06/2009 and no changes required. Family History of Alcoholism/Addiction Family History Hypertension Family History of Cardiovascular disorder  Social History: Reviewed history from 05/06/2009 and no changes required. Married Never Smoked Alcohol use-no Drug use-no Regular exercise-no Disabled  Review of Systems       The patient complains of weight gain and dyspnea on exertion.  The patient denies anorexia, fever, chest pain, syncope, peripheral edema, prolonged cough, headaches, hemoptysis, abdominal pain, melena, hematochezia, severe indigestion/heartburn, hematuria, suspicious skin lesions, and enlarged lymph nodes.   GI:  Complains of gas and hemorrhoids; denies abdominal pain, bloody stools, change in bowel habits, constipation, dark tarry stools, diarrhea, indigestion, loss of appetite, nausea, vomiting, vomiting blood, and yellowish skin color. Endo:  Complains of weight change; denies cold intolerance, excessive hunger, excessive thirst, excessive urination, heat intolerance, and polyuria. Heme:  Denies abnormal bruising, bleeding, enlarge lymph nodes, fevers, pallor, and skin discoloration.  Physical Exam  General:  alert, well-developed, well-nourished, well-hydrated, cooperative to examination, good hygiene, and overweight-appearing.   Head:  normocephalic, atraumatic, no abnormalities observed, and no abnormalities palpated.   Mouth:  Oral mucosa and oropharynx without lesions or exudates.  Teeth in good repair. Neck:  supple,  full ROM, no masses, no carotid bruits, no cervical lymphadenopathy, and no neck tenderness.   Lungs:  Normal respiratory effort, chest expands symmetrically. Lungs are clear to auscultation, no crackles or wheezes. Heart:  Normal rate and regular rhythm. S1 and S2 normal without gallop, murmur, click, rub or other extra sounds. Abdomen:  soft, non-tender, normal bowel sounds, no distention, no masses, no guarding, no hepatomegaly, and no splenomegaly.   Rectal:  no external abnormalities, normal sphincter tone, no masses, no tenderness, no fissures, no fistulae, no perianal rash, and external hemorrhoid(s) that are flat and uncomplicated. heme hegative stool in rectal vault. Msk:  No deformity or scoliosis noted of thoracic or lumbar spine.   Pulses:  R and L carotid,radial,femoral,dorsalis pedis and posterior tibial pulses are full and equal bilaterally Extremities:  No clubbing, cyanosis, edema, or deformity noted with normal full range  of motion of all joints.   Neurologic:  No cranial nerve deficits noted. Station and gait are normal. Plantar reflexes are down-going bilaterally. DTRs are symmetrical throughout. Sensory, motor and coordinative functions appear intact. Skin:  Intact without suspicious lesions or rashes Cervical Nodes:  no anterior cervical adenopathy and no posterior cervical adenopathy.   Axillary Nodes:  no R axillary adenopathy and no L axillary adenopathy.   Psych:  Cognition and judgment appear intact. Alert and cooperative with normal attention span and concentration. No apparent delusions, illusions, hallucinations  Diabetes Management Exam:    Foot Exam (with socks and/or shoes not present):       Sensory-Pinprick/Light touch:          Left medial foot (L-4): normal          Left dorsal foot (L-5): normal          Left lateral foot (S-1): normal          Right medial foot (L-4): normal          Right dorsal foot (L-5): normal          Right lateral foot (S-1):  normal       Sensory-Monofilament:          Left foot: normal          Right foot: normal       Inspection:          Left foot: normal          Right foot: normal       Nails:          Left foot: normal          Right foot: normal   Impression & Recommendations:  Problem # 1:  ANEMIA-NOS (ICD-285.9) Assessment Unchanged  the anemia dates back to 2007 in her medical records and appears stable, I see no evidence of GI blood  loss at this time Her updated medication list for this problem includes:    Feosol 200 (65 Fe) Mg Tabs (Ferrous sulfate dried) ..... One by mouth two times a day with food  Hgb: 8.3 L g/dL (16/09/9603)   Hct: 54.0 L % (02/05/2010)   Platelets: 380.0 (02/05/2010) RBC: 3.35 (02/05/2010)   RDW: 19.4 H % (02/05/2010)   WBC: 10.5 (02/05/2010) MCV: 77.7 (02/05/2010)   MCHC: 31.8 (02/05/2010) Iron: 32 (02/05/2010)   % Sat: 6.0 (02/05/2010) B12: 378 (02/05/2010)   Folate: 11.5 (02/05/2010)   TSH: 1.84 (10/25/2009)  Orders: Hemoccult Guaiac-1 spec.(in office) ([address]8119) Prescription Created Electronically [phone])  Problem # 2:  ASTHMA (ICD-493.90) Assessment: Unchanged  Her updated medication list for this problem includes:    Singulair 10 Mg Tabs (Montelukast sodium) .[name]... Take 1 tablet by mouth once a day    Ventolin Hfa 108 (90 Base) Mcg/act Aers (Albuterol sulfate) ..... Use as directed  Pulmonary Functions Reviewed: O2 sat: 98 (02/14/2010)  Problem # 3:  HYPERTENSION (ICD-401.9) Assessment: Improved  Her updated medication list for this problem includes:    Coreg 25 Mg Tabs (Carvedilol) ..... Bidtab    Exforge 10-320 Mg Tabs (Amlodipine besylate-valsartan) .[name]... Take 1 tablet by mouth once a day  BP today: 144/84 Prior BP: 126/78 (02/05/2010)  Prior 10 Yr Risk Heart Disease: Not enough information (05/06/2009)  Labs Reviewed: K+: 4.2 (02/05/2010) Creat: : 0.8 (02/05/2010)   Chol: 235 (05/06/2009)   HDL: 40.20 (05/06/2009)   TG: 342.0  (05/06/2009)  Problem # 4:  DIABETES MELLITUS, TYPE II (ICD-250.00) Assessment: Improved  Her updated medication list for this problem includes:    Exforge 10-320 Mg Tabs (Amlodipine besylate-valsartan) .[name]... Take 1 tablet by mouth once a day    Actos 30 Mg Tabs (Pioglitazone hcl) ..... One by mouth once daily for diabetes    Metformin Hcl 850 Mg Tabs (Metformin hcl) ..... One by mouth two times a day for diabetes  Labs Reviewed: Creat: 0.8 (02/05/2010)     Last Eye Exam: normal (02/20/2009) Reviewed HgBA1c results: 6.0 (02/05/2010)  7.4 (10/25/2009)  Problem # 5:  GERD (ICD-530.81) Assessment: Improved  Her updated medication list for this problem includes:    Kapidex 60 Mg Cpdr (Dexlansoprazole) ..... Once daily for acid reflux  Labs Reviewed: Hgb: 8.3 L g/dL (16/09/9603)   Hct: 54.0 L % (02/05/2010)  Complete Medication List: 1)  Coreg 25 Mg Tabs (Carvedilol) .... Bidtab 2)  Crestor 10 Mg Tabs (Rosuvastatin calcium) .... Take 1 tablet by mouth once a day 3)  Prozac 20 Mg Caps (Fluoxetine hcl) .... Take 1 tablet by mouth three times a day 4)  Xanax 2 Mg Tabs (Alprazolam) .... Take 1 tablet by mouth three times a day 5)  Lamictal 200 Mg Tabs (Lamotrigine) .... Take 1 tablet by mouth once a day 6)  Singulair 10 Mg Tabs (Montelukast sodium) .... Take 1 tablet by mouth once a day 7)  Kapidex 60 Mg Cpdr (Dexlansoprazole) .... Once daily for acid reflux 8)  Exforge 10-320 Mg Tabs (Amlodipine besylate-valsartan) .... Take 1 tablet by mouth once a day 9)  Bayer Contour Monitor W/device Kit (Blood glucose monitoring suppl) .... Use bid 10)  Bayer Contour Test Strp (Glucose blood) .... Use bid 11)  Ventolin Hfa 108 (90 Base) Mcg/act Aers (Albuterol sulfate) .... Use as directed 12)  Actos 30 Mg Tabs (Pioglitazone hcl) .... One by mouth once daily for diabetes 13)  Metformin Hcl 850 Mg Tabs (Metformin hcl) .... One by mouth two times a day for diabetes 14)  Feosol 200 (65 Fe) Mg Tabs  (Ferrous sulfate dried) .... One by mouth two times a day with food  Patient Instructions: 1)  Please schedule a follow-up appointment in 3 months. Prescriptions: FEOSOL 200 (65 FE) MG TABS (FERROUS SULFATE DRIED) One by mouth two times a day with food  #60 x 11   Entered and Authorized by:   Etta Grandchild MD   Signed by:   Etta Grandchild MD on 02/14/2010   Method used:   Electronically to        [facility] (retail)       [address]       [place], [place]  [address]       Ph: [phone]       Fax: [phone]   RxID:   [id number]

## 2011-01-15 NOTE — Letter (Signed)
Summary: Results Follow-up Letter  [facility]  [address] [place], [place] [address]   Phone: [phone]  Fax: [phone]    10/18/2010  [address] [place], [place]  [address]  Dear Ms. Netto,   The following are the results of your recent test(s):  Test     Result     CBC       mild anemia, slightly high WBC count Liver       one slight enzyme elevation Kidney     normal Blood sugar     good control Urine       normal   _________________________________________________________  Please call for an appointment as directed _________________________________________________________ _________________________________________________________ _________________________________________________________  Sincerely,  Sanda Linger MD [facility]

## 2011-01-15 NOTE — Letter (Signed)
Summary: Results Follow-up Letter  [facility]  [address] [place], [place] [address]   Phone: [phone]  Fax: [phone]    02/06/2010  [address] [place], [place]  [address]  Dear Ms. Hamza,   The following are the results of your recent test(s):  Test     Result     B12       normal' Iron       low CBC       anemia Kidney/liver   normal A1C=6.0     good blood sugars   _________________________________________________________  Please call for an appointment soon to discuss anemia _________________________________________________________ _________________________________________________________ _________________________________________________________  Sincerely,  Sanda Linger MD [facility]

## 2011-01-15 NOTE — Assessment & Plan Note (Signed)
Summary: FU--STC   Vital Signs:  Patient profile:   65 year old female Height:      66 inches Weight:      266 pounds O2 Sat:      98 % on Room air Temp:     98.0 degrees F oral Pulse rate:   90 / minute Pulse rhythm:   regular Resp:     16 per minute BP sitting:   140 / 86  (right arm)  O2 Flow:  Room air  Primary Care Provider:  Etta Grandchild MD   History of Present Illness: She returns c/o persistent weight gain and polyuria.  Preventive Screening-Counseling & Management  Alcohol-Tobacco     Alcohol drinks/day: 0     Smoking Status: never  Clinical Review Panels:  Diabetes Management   HgBA1C:  6.7 (05/06/2009)   Creatinine:  0.8 (05/06/2009)   Last Dilated Eye Exam:  normal (02/20/2009)   Last Foot Exam:  yes (10/25/2009)   Last Flu Vaccine:  Fluvax 3+ (09/25/2009)   Current Medications (verified): 1)  Coreg 25 Mg Tabs (Carvedilol) .... Bidtab 2)  Crestor 10 Mg Tabs (Rosuvastatin Calcium) .... Take 1 Tablet By Mouth Once A Day 3)  Prozac 20 Mg Caps (Fluoxetine Hcl) .... Take 1 Tablet By Mouth Three Times A Day 4)  Xanax 2 Mg Tabs (Alprazolam) .... Take 1 Tablet By Mouth Three Times A Day 5)  Lamictal 200 Mg Tabs (Lamotrigine) .... Take 1 Tablet By Mouth Once A Day 6)  Singulair 10 Mg Tabs (Montelukast Sodium) .... Take 1 Tablet By Mouth Once A Day 7)  Kapidex 60 Mg Cpdr (Dexlansoprazole) .... Once Daily For Acid Reflux 8)  Exforge 10-320 Mg Tabs (Amlodipine Besylate-Valsartan) .... Take 1 Tablet By Mouth Once A Day 9)  Bayer Contour Monitor W/device Kit (Blood Glucose Monitoring Suppl) .... Use Bid 10)  Bayer Contour Test  Strp (Glucose Blood) .... Use Bid 11)  Actoplus Met 15-850 Mg Tabs (Pioglitazone Hcl-Metformin Hcl) .... One By Mouth Two Times A Day For Diabetes 12)  Ventolin Hfa 108 (90 Base) Mcg/act Aers (Albuterol Sulfate) .... Use As Directed  Allergies (verified): 1)  ! Penicillin  Past History:  Past Medical History: Reviewed history from  05/06/2009 and no changes required. Anemia-NOS Depression Diabetes mellitus, type II GERD Hyperlipidemia Hypertension  Past Surgical History: Reviewed history from 05/06/2009 and no changes required. Hemorrhoidectomy  Family History: Reviewed history from 05/06/2009 and no changes required. Family History of Alcoholism/Addiction Family History Hypertension Family History of Cardiovascular disorder  Social History: Reviewed history from 05/06/2009 and no changes required. Married Never Smoked Alcohol use-no Drug use-no Regular exercise-no Disabled  Review of Systems       The patient complains of weight gain.  The patient denies anorexia, chest pain, syncope, dyspnea on exertion, peripheral edema, prolonged cough, headaches, hemoptysis, abdominal pain, hematuria, suspicious skin lesions, enlarged lymph nodes, and angioedema.   Resp:  Complains of wheezing; denies chest discomfort, chest pain with inspiration, cough, coughing up blood, pleuritic, shortness of breath, and sputum productive.  Physical Exam  General:  alert, well-developed, well-nourished, well-hydrated, cooperative to examination, good hygiene, and overweight-appearing.   Mouth:  Oral mucosa and oropharynx without lesions or exudates.  Teeth in good repair. Neck:  supple, full ROM, no masses, no carotid bruits, no cervical lymphadenopathy, and no neck tenderness.   Lungs:  Normal respiratory effort, chest expands symmetrically. Lungs are clear to auscultation, no crackles or wheezes. Heart:  Normal rate and  regular rhythm. S1 and S2 normal without gallop, murmur, click, rub or other extra sounds. Abdomen:  soft, non-tender, normal bowel sounds, no distention, no masses, no guarding, no hepatomegaly, and no splenomegaly.   Msk:  No deformity or scoliosis noted of thoracic or lumbar spine.   Extremities:  No clubbing, cyanosis, edema, or deformity noted with normal full range of motion of all joints.   Skin:   turgor normal, color normal, and no rashes.   Psych:  Oriented X3, memory intact for recent and remote, normally interactive, good eye contact, not anxious appearing, not depressed appearing, not agitated, and subdued.    Diabetes Management Exam:    Foot Exam (with socks and/or shoes not present):       Sensory-Pinprick/Light touch:          Left medial foot (L-4): normal          Left dorsal foot (L-5): normal          Left lateral foot (S-1): normal          Right medial foot (L-4): normal          Right dorsal foot (L-5): normal          Right lateral foot (S-1): normal       Sensory-Monofilament:          Left foot: normal          Right foot: normal       Inspection:          Left foot: normal          Right foot: normal       Nails:          Left foot: normal          Right foot: normal   Impression & Recommendations:  Problem # 1:  HYPERTENSION (ICD-401.9) Assessment Unchanged  Her updated medication list for this problem includes:    Coreg 25 Mg Tabs (Carvedilol) ..... Bidtab    Exforge 10-320 Mg Tabs (Amlodipine besylate-valsartan) .[name]... Take 1 tablet by mouth once a day  Orders: Venipuncture ([address]) TLB-BMP (Basic Metabolic Panel-BMET) (80048-METABOL) TLB-CBC Platelet - w/Differential (85025-CBCD) TLB-Hepatic/Liver Function Pnl (80076-HEPATIC) TLB-TSH (Thyroid Stimulating Hormone) (84443-TSH) TLB-A1C / Hgb A1C (Glycohemoglobin) (83036-A1C) TLB-Udip w/ Micro (81001-URINE)  BP today: 140/86 Prior BP: 122/72 (06/19/2009)  Prior 10 Yr Risk Heart Disease: Not enough information (05/06/2009)  Labs Reviewed: K+: 3.9 (05/06/2009) Creat: : 0.8 (05/06/2009)   Chol: 235 (05/06/2009)   HDL: 40.20 (05/06/2009)   TG: 342.0 (05/06/2009)  Problem # 2:  GERD (ICD-530.81) Assessment: Improved  Her updated medication list for this problem includes:    Kapidex 60 Mg Cpdr (Dexlansoprazole) ..... Once daily for acid reflux  Problem # 3:  DIABETES MELLITUS, TYPE II  (ICD-250.00) Assessment: Unchanged  The following medications were removed from the medication list:    Metformin Hcl 500 Mg Tabs (Metformin hcl) .[name]... 1/2 tab two times a day Her updated medication list for this problem includes:    Exforge 10-320 Mg Tabs (Amlodipine besylate-valsartan) .[name]... Take 1 tablet by mouth once a day    Actoplus Met 15-850 Mg Tabs (Pioglitazone hcl-metformin hcl) ..... One by mouth two times a day for diabetes  Orders: Venipuncture ([address]) TLB-BMP (Basic Metabolic Panel-BMET) (80048-METABOL) TLB-CBC Platelet - w/Differential (85025-CBCD) TLB-Hepatic/Liver Function Pnl (80076-HEPATIC) TLB-TSH (Thyroid Stimulating Hormone) (84443-TSH) TLB-A1C / Hgb A1C (Glycohemoglobin) (83036-A1C) TLB-Udip w/ Micro (81001-URINE)  Labs Reviewed: Creat: 0.8 (05/06/2009)     Last Eye  Exam: normal (02/20/2009) Reviewed HgBA1c results: 6.7 (05/06/2009)  Problem # 4:  ASTHMA (ICD-493.90) Assessment: Unchanged  Her updated medication list for this problem includes:    Singulair 10 Mg Tabs (Montelukast sodium) .[name]... Take 1 tablet by mouth once a day    Ventolin Hfa 108 (90 Base) Mcg/act Aers (Albuterol sulfate) ..... Use as directed  Pulmonary Functions Reviewed: O2 sat: 98 (10/25/2009)  Complete Medication List: 1)  Coreg 25 Mg Tabs (Carvedilol) .... Bidtab 2)  Crestor 10 Mg Tabs (Rosuvastatin calcium) .... Take 1 tablet by mouth once a day 3)  Prozac 20 Mg Caps (Fluoxetine hcl) .... Take 1 tablet by mouth three times a day 4)  Xanax 2 Mg Tabs (Alprazolam) .... Take 1 tablet by mouth three times a day 5)  Lamictal 200 Mg Tabs (Lamotrigine) .... Take 1 tablet by mouth once a day 6)  Singulair 10 Mg Tabs (Montelukast sodium) .... Take 1 tablet by mouth once a day 7)  Kapidex 60 Mg Cpdr (Dexlansoprazole) .... Once daily for acid reflux 8)  Exforge 10-320 Mg Tabs (Amlodipine besylate-valsartan) .... Take 1 tablet by mouth once a day 9)  Bayer Contour Monitor W/device Kit  (Blood glucose monitoring suppl) .... Use bid 10)  Bayer Contour Test Strp (Glucose blood) .... Use bid 11)  Actoplus Met 15-850 Mg Tabs (Pioglitazone hcl-metformin hcl) .... One by mouth two times a day for diabetes 12)  Ventolin Hfa 108 (90 Base) Mcg/act Aers (Albuterol sulfate) .... Use as directed  Patient Instructions: 1)  Please schedule a follow-up appointment in 4 months. 2)  It is important that you exercise regularly at least 20 minutes 5 times a week. If you develop chest pain, have severe difficulty breathing, or feel very tired , stop exercising immediately and seek medical attention. 3)  You need to lose weight. Consider a lower calorie diet and regular exercise.  4)  Check your blood sugars regularly. If your readings are usually above  200  or below 70 you should contact our office. 5)  It is important that your Diabetic A1c level is checked every 3 months. 6)  See your eye doctor yearly to check for diabetic eye damage. 7)  Check your feet each night for sore areas, calluses or signs of infection. 8)  Check your Blood Pressure regularly. If it is above 130/80: you should make an appointment. Prescriptions: KAPIDEX 60 MG CPDR (DEXLANSOPRAZOLE) once daily for acid reflux  #30 x 11   Entered by:   Rock Nephew CMA   Authorized by:   Etta Grandchild MD   Signed by:   Rock Nephew CMA on 10/25/2009   Method used:   Electronically to        [facility] (retail)       [address]       [place], [place]  [address]       Ph: [phone]       Fax: [phone]   RxID:   [id number] VENTOLIN HFA 108 (90 BASE) MCG/ACT AERS (ALBUTEROL SULFATE) use as directed  #69mo x 4   Entered by:   Rock Nephew CMA   Authorized by:   Etta Grandchild MD   Signed by:   Rock Nephew CMA on 10/25/2009   Method used:   Electronically to        [facility] (retail)       [address]  [place], [place]  [address]        Ph: [phone]       Fax: [phone]   RxID:   [id number] KAPIDEX 60 MG CPDR (DEXLANSOPRAZOLE) once daily for acid reflux  #30 x 11   Entered by:   Rock Nephew CMA   Authorized by:   Etta Grandchild MD   Signed by:   Rock Nephew CMA on 10/25/2009   Method used:   Samples Given   RxID:   [id number] ACTOPLUS MET 15-850 MG TABS (PIOGLITAZONE HCL-METFORMIN HCL) One by mouth two times a day for diabetes  #112 x 0   Entered and Authorized by:   Etta Grandchild MD   Signed by:   Etta Grandchild MD on 10/25/2009   Method used:   Samples Given   RxID:   [id number]

## 2011-01-15 NOTE — Assessment & Plan Note (Signed)
Summary: F/U APPT/#/CD   Vital Signs:  Patient profile:   65 year old female Height:      66 inches Weight:      269 pounds BMI:     43.57 O2 Sat:      98 % on Room air Temp:     98.2 degrees F oral Pulse rate:   80 / minute Pulse rhythm:   regular Resp:     16 per minute BP sitting:   126 / 78  (left arm) Cuff size:   large  Vitals Entered By: Estell Harpin CMA (February 05, 2010 2:36 PM)  Nutrition Counseling: Patient's BMI is greater than 25 and therefore counseled on weight management options.  O2 Flow:  Room air  Primary Care Provider:  Janith Lima MD   History of Present Illness:  Hypertension Follow-Up      This is a 65 year old woman who presents for Hypertension follow-up.  The patient reports fatigue, but denies lightheadedness, urinary frequency, headaches, and edema.  The patient denies the following associated symptoms: chest pain, chest pressure, exercise intolerance, dyspnea, palpitations, syncope, leg edema, and pedal edema.  Compliance with medications (by patient report) has been near 100%.  The patient reports that dietary compliance has been fair.  The patient reports no exercise.    Asthma History    Initial Asthma Severity Rating:    Age range: 12+ years    Symptoms: daily    Nighttime Awakenings: 0-2/month    Interferes w/ normal activity: no limitations    SABA use (not for EIB): daily    Asthma Severity Assessment: Moderate Persistent  Lipid Management History:      Positive NCEP/ATP III risk factors include female age 63 years old or older, diabetes, and hypertension.  Negative NCEP/ATP III risk factors include no family history for ischemic heart disease, non-tobacco-user status, no ASHD (atherosclerotic heart disease), no prior stroke/TIA, no peripheral vascular disease, and no history of aortic aneurysm.        The patient states that she knows about the "Therapeutic Lifestyle Change" diet.  Her compliance with the TLC diet is not at all.   The patient expresses understanding of adjunctive measures for cholesterol lowering.  Adjunctive measures started by the patient include fiber and limit alcohol consumpton.  She expresses no side effects from her lipid-lowering medication.  The patient denies any symptoms to suggest myopathy or liver disease.      Preventive Screening-Counseling & Management  Alcohol-Tobacco     Alcohol drinks/day: 0     Smoking Status: never  Current Medications (verified): 1)  Coreg 25 Mg Tabs (Carvedilol) .... Bidtab 2)  Crestor 10 Mg Tabs (Rosuvastatin Calcium) .... Take 1 Tablet By Mouth Once A Day 3)  Prozac 20 Mg Caps (Fluoxetine Hcl) .... Take 1 Tablet By Mouth Three Times A Day 4)  Xanax 2 Mg Tabs (Alprazolam) .... Take 1 Tablet By Mouth Three Times A Day 5)  Lamictal 200 Mg Tabs (Lamotrigine) .... Take 1 Tablet By Mouth Once A Day 6)  Singulair 10 Mg Tabs (Montelukast Sodium) .... Take 1 Tablet By Mouth Once A Day 7)  Kapidex 60 Mg Cpdr (Dexlansoprazole) .... Once Daily For Acid Reflux 8)  Exforge 10-320 Mg Tabs (Amlodipine Besylate-Valsartan) .... Take 1 Tablet By Mouth Once A Day 9)  Bayer Contour Monitor W/device Kit (Blood Glucose Monitoring Suppl) .... Use Bid 10)  Bayer Contour Test  Strp (Glucose Blood) .... Use Bid 11)  Actoplus Met  15-850 Mg Tabs (Pioglitazone Hcl-Metformin Hcl) .... One By Mouth Two Times A Day For Diabetes 12)  Ventolin Hfa 108 (90 Base) Mcg/act Aers (Albuterol Sulfate) .... Use As Directed  Allergies (verified): 1)  ! Penicillin  Past History:  Past Medical History: Reviewed history from 05/06/2009 and no changes required. Anemia-NOS Depression Diabetes mellitus, type II GERD Hyperlipidemia Hypertension  Past Surgical History: Reviewed history from 05/06/2009 and no changes required. Hemorrhoidectomy  Family History: Reviewed history from 05/06/2009 and no changes required. Family History of Alcoholism/Addiction Family History Hypertension Family  History of Cardiovascular disorder  Social History: Reviewed history from 05/06/2009 and no changes required. Married Never Smoked Alcohol use-no Drug use-no Regular exercise-no Disabled  Review of Systems       The patient complains of weight gain.  The patient denies anorexia, fever, chest pain, peripheral edema, prolonged cough, hemoptysis, abdominal pain, hematuria, suspicious skin lesions, and enlarged lymph nodes.   Resp:  Complains of shortness of breath; denies chest discomfort, chest pain with inspiration, cough, coughing up blood, excessive snoring, pleuritic, and wheezing. Endo:  Complains of polyuria and weight change; denies cold intolerance, excessive hunger, excessive thirst, excessive urination, and heat intolerance.  Physical Exam  General:  alert, well-developed, well-nourished, well-hydrated, cooperative to examination, good hygiene, and overweight-appearing.   Head:  normocephalic, atraumatic, no abnormalities observed, and no abnormalities palpated.   Mouth:  Oral mucosa and oropharynx without lesions or exudates.  Teeth in good repair. Neck:  supple, full ROM, no masses, no carotid bruits, no cervical lymphadenopathy, and no neck tenderness.   Lungs:  Normal respiratory effort, chest expands symmetrically. Lungs are clear to auscultation, no crackles or wheezes. Heart:  Normal rate and regular rhythm. S1 and S2 normal without gallop, murmur, click, rub or other extra sounds. Abdomen:  soft, non-tender, normal bowel sounds, no distention, no masses, no guarding, no hepatomegaly, and no splenomegaly.   Msk:  No deformity or scoliosis noted of thoracic or lumbar spine.   Pulses:  R and L carotid,radial,femoral,dorsalis pedis and posterior tibial pulses are full and equal bilaterally Extremities:  No clubbing, cyanosis, edema, or deformity noted with normal full range of motion of all joints.   Neurologic:  No cranial nerve deficits noted. Station and gait are normal.  Plantar reflexes are down-going bilaterally. DTRs are symmetrical throughout. Sensory, motor and coordinative functions appear intact. Skin:  turgor normal, color normal, and no rashes.   Cervical Nodes:  no anterior cervical adenopathy and no posterior cervical adenopathy.   Psych:  Oriented X3, memory intact for recent and remote, normally interactive, good eye contact, not anxious appearing, not depressed appearing, not agitated, and subdued.    Diabetes Management Exam:    Foot Exam (with socks and/or shoes not present):       Sensory-Pinprick/Light touch:          Left medial foot (L-4): normal          Left dorsal foot (L-5): normal          Left lateral foot (S-1): normal          Right medial foot (L-4): normal          Right dorsal foot (L-5): normal          Right lateral foot (S-1): normal       Sensory-Monofilament:          Left foot: normal          Right foot: normal  Inspection:          Left foot: normal          Right foot: normal       Nails:          Left foot: normal          Right foot: normal   Impression & Recommendations:  Problem # 1:  HYPERTENSION (ICD-401.9) Assessment Improved  Her updated medication list for this problem includes:    Coreg 25 Mg Tabs (Carvedilol) ..... Bidtab    Exforge 10-320 Mg Tabs (Amlodipine besylate-valsartan) .[name]... Take 1 tablet by mouth once a day  Orders: Venipuncture [id number]) TLB-B12 + Folate Pnl [id number]) TLB-IBC Pnl (Iron/FE;Transferrin) (83550-IBC) TLB-CBC Platelet - w/Differential (85025-CBCD) TLB-BMP (Basic Metabolic Panel-BMET) (99991111) TLB-Hepatic/Liver Function Pnl (80076-HEPATIC) TLB-A1C / Hgb A1C (Glycohemoglobin) (83036-A1C) TLB-Udip w/ Micro (81001-URINE)  BP today: 126/78 Prior BP: 142/80 (12/27/2009)  Prior 10 Yr Risk Heart Disease: Not enough information (05/06/2009)  Labs Reviewed: K+: 4.2 (10/25/2009) Creat: : 0.8 (10/25/2009)   Chol: 235 (05/06/2009)   HDL: 40.20  (05/06/2009)   TG: 342.0 (05/06/2009)  Problem # 2:  HYPERLIPIDEMIA (ICD-272.4) Assessment: Unchanged  Her updated medication list for this problem includes:    Crestor 10 Mg Tabs (Rosuvastatin calcium) .[name]... Take 1 tablet by mouth once a day  Orders: Venipuncture [id number]) TLB-B12 + Folate Pnl [id number]) TLB-IBC Pnl (Iron/FE;Transferrin) (83550-IBC) TLB-CBC Platelet - w/Differential (85025-CBCD) TLB-BMP (Basic Metabolic Panel-BMET) (99991111) TLB-Hepatic/Liver Function Pnl (80076-HEPATIC) TLB-A1C / Hgb A1C (Glycohemoglobin) (83036-A1C) TLB-Udip w/ Micro (81001-URINE)  Labs Reviewed: SGOT: 39 (10/25/2009)   SGPT: 30 (10/25/2009)  Lipid Goals: Chol Goal: 200 (12/27/2009)   HDL Goal: 40 (12/27/2009)   LDL Goal: 100 (12/27/2009)   TG Goal: 150 (12/27/2009)  Prior 10 Yr Risk Heart Disease: Not enough information (05/06/2009)   HDL:40.20 (05/06/2009)  Chol:235 (05/06/2009)  Trig:342.0 (05/06/2009)  Problem # 3:  DIABETES MELLITUS, TYPE II (ICD-250.00) Assessment: Deteriorated  The following medications were removed from the medication list:    Actoplus Met 15-850 Mg Tabs (Pioglitazone hcl-metformin hcl) ..... One by mouth two times a day for diabetes Her updated medication list for this problem includes:    Exforge 10-320 Mg Tabs (Amlodipine besylate-valsartan) .[name]... Take 1 tablet by mouth once a day    Actos 30 Mg Tabs (Pioglitazone hcl) ..... One by mouth once daily for diabetes    Metformin Hcl 850 Mg Tabs (Metformin hcl) ..... One by mouth two times a day for diabetes  Orders: Venipuncture [id number]) TLB-B12 + Folate Pnl [id number]) TLB-IBC Pnl (Iron/FE;Transferrin) (83550-IBC) TLB-CBC Platelet - w/Differential (85025-CBCD) TLB-BMP (Basic Metabolic Panel-BMET) (99991111) TLB-Hepatic/Liver Function Pnl (80076-HEPATIC) TLB-A1C / Hgb A1C (Glycohemoglobin) (83036-A1C) TLB-Udip w/ Micro (81001-URINE)  Labs Reviewed: Creat: 0.8 (10/25/2009)      Last Eye Exam: normal (02/20/2009) Reviewed HgBA1c results: 7.4 (10/25/2009)  6.7 (05/06/2009)  Problem # 4:  ASTHMA (ICD-493.90) Assessment: Deteriorated  she states that advair is too expensive so she won't use it Her updated medication list for this problem includes:    Singulair 10 Mg Tabs (Montelukast sodium) .[name]... Take 1 tablet by mouth once a day    Ventolin Hfa 108 (90 Base) Mcg/act Aers (Albuterol sulfate) ..... Use as directed  Pulmonary Functions Reviewed: O2 sat: 98 (02/05/2010)  Complete Medication List: 1)  Coreg 25 Mg Tabs (Carvedilol) .... Bidtab 2)  Crestor 10 Mg Tabs (Rosuvastatin calcium) .... Take 1 tablet by mouth once a day 3)  Prozac 20 Mg Caps (Fluoxetine hcl) .... Take 1 tablet  by mouth three times a day 4)  Xanax 2 Mg Tabs (Alprazolam) .... Take 1 tablet by mouth three times a day 5)  Lamictal 200 Mg Tabs (Lamotrigine) .... Take 1 tablet by mouth once a day 6)  Singulair 10 Mg Tabs (Montelukast sodium) .... Take 1 tablet by mouth once a day 7)  Kapidex 60 Mg Cpdr (Dexlansoprazole) .... Once daily for acid reflux 8)  Exforge 10-320 Mg Tabs (Amlodipine besylate-valsartan) .... Take 1 tablet by mouth once a day 9)  Bayer Contour Monitor W/device Kit (Blood glucose monitoring suppl) .... Use bid 10)  Bayer Contour Test Strp (Glucose blood) .... Use bid 11)  Ventolin Hfa 108 (90 Base) Mcg/act Aers (Albuterol sulfate) .... Use as directed 12)  Actos 30 Mg Tabs (Pioglitazone hcl) .... One by mouth once daily for diabetes 13)  Metformin Hcl 850 Mg Tabs (Metformin hcl) .... One by mouth two times a day for diabetes  Other Orders: Tdap => 75yr IM ([id number] Admin 1st Vaccine ([id number]  Lipid Assessment/Plan:      Based on NCEP/ATP III, the patient's risk factor category is "history of diabetes".  The patient's lipid goals are as follows: Total cholesterol goal is 200; LDL cholesterol goal is 100; HDL cholesterol goal is 40; Triglyceride goal is 150.    Colorectal  Screening:  Colonoscopy Results:    Date of Exam: 02/23/2007    Results: Normal  PAP Screening:    Hx Cervical Dysplasia in last 5 yrs? No    3 normal PAP smears in last 5 yrs? Yes    Last PAP smear:  05/22/2003    Reviewed PAP smear recommendations:  patient defers to GYN provider  Mammogram Screening:    Last Mammogram:  05/22/2009  Mammogram Results:    Date of Exam:  05/22/2009    Results:  Normal Bilateral  Osteoporosis Risk Assessment:  Risk Factors for Fracture or Low Bone Density:   Smoking status:       never  Immunization & Chemoprophylaxis:    Tetanus vaccine: Tdap  (02/05/2010)    Influenza vaccine: Fluvax 3+  (09/25/2009)  Patient Instructions: 1)  Please schedule a follow-up appointment in 4 months. 2)  It is important that you exercise regularly at least 20 minutes 5 times a week. If you develop chest pain, have severe difficulty breathing, or feel very tired , stop exercising immediately and seek medical attention. 3)  You need to lose weight. Consider a lower calorie diet and regular exercise.  4)  Check your blood sugars regularly. If your readings are usually above 200  or below 70 you should contact our office. 5)  It is important that your Diabetic A1c level is checked every 3 months. 6)  See your eye doctor yearly to check for diabetic eye damage. 7)  Check your feet each night for sore areas, calluses or signs of infection. 8)  Check your Blood Pressure regularly. If it is above 130/80: you should make an appointment. Prescriptions: CRESTOR 10 MG TABS (ROSUVASTATIN CALCIUM) Take 1 tablet by mouth once a day  #84 x 0   Entered and Authorized by:   TJanith LimaMD   Signed by:   TJanith LimaMD on 02/05/2010   Method used:   Samples Given   RxID:   [id number] 10 MG TABS (MONTELUKAST SODIUM) Take 1 tablet by mouth once a day  #30 x 11   Entered and Authorized by:   TJanith LimaMD  Signed by:   Janith Lima MD on  02/05/2010   Method used:   Electronically to        [facility] (retail)       [address]       [place], [place]  [address]       Ph: [phone]       Fax: [phone]   RxID:   [id number] HCL 850 MG TABS (METFORMIN HCL) One by mouth two times a day for diabetes  #60 x 11   Entered and Authorized by:   Janith Lima MD   Signed by:   Janith Lima MD on 02/05/2010   Method used:   Electronically to        [facility] (retail)       [address]       [place], [place]  [address]       Ph: [phone]       Fax: [phone]   RxID:   [id number] ACTOS 30 MG TABS (PIOGLITAZONE HCL) One by mouth once daily for diabetes  #30 x 11   Entered and Authorized by:   Janith Lima MD   Signed by:   Janith Lima MD on 02/05/2010   Method used:   Electronically to        [facility] (retail)       [address]       [place], [place]  [address]       Ph: [phone]       Fax: [phone]   RxID:   [id number]     Immunizations Administered:  Tetanus Vaccine:    Vaccine Type: Tdap    Site: left deltoid    Mfr: [employer]    Dose: 0.5 ml    Route: IM    Given by: Estell Harpin CMA    Exp. Date: 02/08/2012    Lot #: [id number]    VIS given: 11/01/07 version given February 05, 2010.

## 2011-01-15 NOTE — Assessment & Plan Note (Signed)
Summary: PER PT D/T---STC   Vital Signs:  Patient profile:   65 year old female Height:      66 inches Weight:      264 pounds BMI:     42.76 O2 Sat:      97 % on Room air Temp:     98.2 degrees F oral Pulse rate:   84 / minute Pulse rhythm:   regular BP sitting:   126 / 70  (left arm) Cuff size:   large  Vitals Entered By: Estell Harpin CMA (May 21, 2010 2:18 PM)  O2 Flow:  Room air CC: follow-up visit// discuss test results, Lipid Management Is Patient Diabetic? Yes Did you bring your meter with you today? No   Primary Care Provider:  Janith Lima MD  CC:  follow-up visit// discuss test results and Lipid Management.  History of Present Illness:  Follow-Up Visit      This is a 65 year old woman who presents for Follow-up visit.  The patient complains of chest pain and dizziness, but denies palpitations, syncope, low blood sugar symptoms, high blood sugar symptoms, edema, SOB, DOE, PND, and orthopnea.  Since the last visit the patient notes no new problems or concerns.  The patient reports taking meds as prescribed, monitoring BP, and monitoring blood sugars.  When questioned about possible medication side effects, the patient notes none.    Lipid Management History:      Positive NCEP/ATP III risk factors include female age 80 years old or older, diabetes, and hypertension.  Negative NCEP/ATP III risk factors include no family history for ischemic heart disease, non-tobacco-user status, no ASHD (atherosclerotic heart disease), no prior stroke/TIA, no peripheral vascular disease, and no history of aortic aneurysm.        The patient states that she knows about the "Therapeutic Lifestyle Change" diet.  Her compliance with the TLC diet is not at all.  The patient expresses understanding of adjunctive measures for cholesterol lowering.  Adjunctive measures started by the patient include fiber, ASA, and limit alcohol consumpton.  She expresses no side effects from her  lipid-lowering medication.  The patient denies any symptoms to suggest myopathy or liver disease.     Preventive Screening-Counseling & Management  Alcohol-Tobacco     Alcohol drinks/day: 0     Smoking Status: never  Hep-HIV-STD-Contraception     Hepatitis Risk: no risk noted     HIV Risk: no risk noted     STD Risk: no risk noted      Drug Use:  no.    Clinical Review Panels:  Immunizations   Last Tetanus Booster:  Tdap (02/05/2010)   Last Flu Vaccine:  Fluvax 3+ (09/25/2009)  Lipid Management   Cholesterol:  235 (05/06/2009)   HDL (good cholesterol):  40.20 (05/06/2009)  Diabetes Management   HgBA1C:  5.7 (05/07/2010)   Creatinine:  0.8 (05/07/2010)   Last Dilated Eye Exam:  normal (02/20/2009)   Last Foot Exam:  yes (05/21/2010)   Last Flu Vaccine:  Fluvax 3+ (09/25/2009)  CBC   WBC:  10.9 (05/07/2010)   RBC:  3.81 (05/07/2010)   Hgb:  11.0 (05/07/2010)   Hct:  32.6 (05/07/2010)   Platelets:  349.0 (05/07/2010)   MCV  85.7 (05/07/2010)   MCHC  33.7 (05/07/2010)   RDW  20.9 (05/07/2010)   PMN:  65.0 (05/07/2010)   Lymphs:  24.9 (05/07/2010)   Monos:  8.2 (05/07/2010)   Eosinophils:  1.6 (05/07/2010)   Basophil:  0.3 (05/07/2010)  Complete Metabolic Panel   Glucose:  97 (05/07/2010)   Sodium:  143 (05/07/2010)   Potassium:  4.6 (05/07/2010)   Chloride:  105 (05/07/2010)   CO2:  27 (05/07/2010)   BUN:  13 (05/07/2010)   Creatinine:  0.8 (05/07/2010)   Albumin:  4.5 (05/07/2010)   Total Protein:  7.5 (05/07/2010)   Calcium:  9.9 (05/07/2010)   Total Bili:  0.3 (05/07/2010)   Alk Phos:  71 (05/07/2010)   SGPT (ALT):  41 (05/07/2010)   SGOT (AST):  45 (05/07/2010)   Medications Prior to Update: 1)  Coreg 25 Mg Tabs (Carvedilol) .[name].. 1 By Mouth Two Times A Day 2)  Crestor 10 Mg Tabs (Rosuvastatin Calcium) .... Take 1 Tablet By Mouth Once A Day 3)  Prozac 20 Mg Caps (Fluoxetine Hcl) .... Take 1 Tablet By Mouth Three Times A Day 4)  Xanax 2 Mg Tabs  (Alprazolam) .... Take 1 Tablet By Mouth Three Times A Day 5)  Lamictal 200 Mg Tabs (Lamotrigine) .... Take 1 Tablet By Mouth Once A Day 6)  Singulair 10 Mg Tabs (Montelukast Sodium) .... Take 1 Tablet By Mouth Once A Day 7)  Kapidex 60 Mg Cpdr (Dexlansoprazole) .... Once Daily For Acid Reflux 8)  Exforge 10-320 Mg Tabs (Amlodipine Besylate-Valsartan) .... Take 1 Tablet By Mouth Once A Day 9)  Bayer Contour Monitor W/device Kit (Blood Glucose Monitoring Suppl) .... Use Bid 10)  Bayer Contour Test  Strp (Glucose Blood) .... Use Bid 11)  Ventolin Hfa 108 (90 Base) Mcg/act Aers (Albuterol Sulfate) .... Use As Directed 12)  Actos 30 Mg Tabs (Pioglitazone Hcl) .... One By Mouth Once Daily For Diabetes 13)  Metformin Hcl 850 Mg Tabs (Metformin Hcl) .... One By Mouth Two Times A Day For Diabetes 14)  Feosol 200 (65 Fe) Mg Tabs (Ferrous Sulfate Dried) .... One By Mouth Two Times A Day With Food  Current Medications (verified): 1)  Coreg 25 Mg Tabs (Carvedilol) .[name].. 1 By Mouth Two Times A Day 2)  Crestor 10 Mg Tabs (Rosuvastatin Calcium) .... Take 1 Tablet By Mouth Once A Day 3)  Prozac 20 Mg Caps (Fluoxetine Hcl) .... Take 1 Tablet By Mouth Three Times A Day 4)  Xanax 2 Mg Tabs (Alprazolam) .... Take 1 Tablet By Mouth Three Times A Day 5)  Lamictal 200 Mg Tabs (Lamotrigine) .... Take 1 Tablet By Mouth Once A Day 6)  Singulair 10 Mg Tabs (Montelukast Sodium) .... Take 1 Tablet By Mouth Once A Day 7)  Kapidex 60 Mg Cpdr (Dexlansoprazole) .... Once Daily For Acid Reflux 8)  Exforge 10-320 Mg Tabs (Amlodipine Besylate-Valsartan) .... Take 1 Tablet By Mouth Once A Day 9)  Bayer Contour Monitor W/device Kit (Blood Glucose Monitoring Suppl) .... Use Bid 10)  Bayer Contour Test  Strp (Glucose Blood) .... Use Bid 11)  Ventolin Hfa 108 (90 Base) Mcg/act Aers (Albuterol Sulfate) .... Use As Directed 12)  Actos 30 Mg Tabs (Pioglitazone Hcl) .... One By Mouth Once Daily For Diabetes 13)  Metformin Hcl 850 Mg  Tabs (Metformin Hcl) .... One By Mouth Two Times A Day For Diabetes 14)  Feosol 200 (65 Fe) Mg Tabs (Ferrous Sulfate Dried) .... One By Mouth Two Times A Day With Food  Allergies (verified): 1)  ! Penicillin  Past History:  Past Medical History: Last updated: 05/06/2009 Anemia-NOS Depression Diabetes mellitus, type II GERD Hyperlipidemia Hypertension  Past Surgical History: Last updated: 05/06/2009 Hemorrhoidectomy  Family History: Last updated:  05/06/2009 Family History of Alcoholism/Addiction Family History Hypertension Family History of Cardiovascular disorder  Social History: Last updated: 05/06/2009 Married Never Smoked Alcohol use-no Drug use-no Regular exercise-no Disabled  Risk Factors: Alcohol Use: 0 (05/21/2010) Exercise: no (05/06/2009)  Risk Factors: Smoking Status: never (05/21/2010)  Family History: Reviewed history from 05/06/2009 and no changes required. Family History of Alcoholism/Addiction Family History Hypertension Family History of Cardiovascular disorder  Social History: Reviewed history from 05/06/2009 and no changes required. Married Never Smoked Alcohol use-no Drug use-no Regular exercise-no Disabled  Review of Systems       The patient complains of weight gain and dyspnea on exertion.  The patient denies anorexia, fever, syncope, peripheral edema, prolonged cough, headaches, hemoptysis, abdominal pain, melena, hematochezia, severe indigestion/heartburn, hematuria, difficulty walking, depression, abnormal bleeding, and enlarged lymph nodes.   Psych:  Complains of easily angered, easily tearful, and irritability; denies alternate hallucination ( auditory/visual), anxiety, depression, panic attacks, sense of great danger, suicidal thoughts/plans, thoughts of violence, unusual visions or sounds, and thoughts /plans of harming others. Endo:  Denies cold intolerance, excessive hunger, excessive thirst, excessive urination, heat  intolerance, and polyuria. Heme:  Denies abnormal bruising, bleeding, enlarge lymph nodes, fevers, pallor, and skin discoloration.  Physical Exam  General:  alert, well-developed, well-nourished, well-hydrated, cooperative to examination, good hygiene, and overweight-appearing.   Head:  normocephalic, atraumatic, no abnormalities observed, and no abnormalities palpated.   Mouth:  Oral mucosa and oropharynx without lesions or exudates.  Teeth in good repair. Neck:  supple, full ROM, no masses, no carotid bruits, no cervical lymphadenopathy, and no neck tenderness.   Lungs:  Normal respiratory effort, chest expands symmetrically. Lungs are clear to auscultation, no crackles or wheezes. Heart:  Normal rate and regular rhythm. S1 and S2 normal without gallop, murmur, click, rub or other extra sounds. Abdomen:  soft, non-tender, normal bowel sounds, no distention, no masses, no guarding, no hepatomegaly, and no splenomegaly.   Msk:  No deformity or scoliosis noted of thoracic or lumbar spine.   Pulses:  R and L carotid,radial,femoral,dorsalis pedis and posterior tibial pulses are full and equal bilaterally Extremities:  No clubbing, cyanosis, edema, or deformity noted with normal full range of motion of all joints.   Neurologic:  No cranial nerve deficits noted. Station and gait are normal. Plantar reflexes are down-going bilaterally. DTRs are symmetrical throughout. Sensory, motor and coordinative functions appear intact. Skin:  turgor normal, color normal, no rashes, no suspicious lesions, no ecchymoses, no petechiae, no purpura, no ulcerations, and no edema.   Cervical Nodes:  no anterior cervical adenopathy and no posterior cervical adenopathy.   Psych:  Oriented X3, memory intact for recent and remote, normally interactive, good eye contact, not anxious appearing, not depressed appearing, not agitated, and not suicidal.    Diabetes Management Exam:    Foot Exam (with socks and/or shoes not  present):       Sensory-Pinprick/Light touch:          Left medial foot (L-4): normal          Left dorsal foot (L-5): normal          Left lateral foot (S-1): normal          Right medial foot (L-4): normal          Right dorsal foot (L-5): normal          Right lateral foot (S-1): normal       Sensory-Monofilament:  Left foot: normal          Right foot: normal       Inspection:          Left foot: normal          Right foot: normal       Nails:          Left foot: normal          Right foot: normal   Impression & Recommendations:  Problem # 1:  CHEST PAIN (ICD-786.50) Assessment Unchanged  Orders: Cardiology Referral (Cardiology)  Problem # 2:  HYPERTENSION (ICD-401.9) Assessment: Improved  Her updated medication list for this problem includes:    Coreg 25 Mg Tabs (Carvedilol) .[name]... 1 by mouth two times a day    Exforge 10-320 Mg Tabs (Amlodipine besylate-valsartan) .[name]... Take 1 tablet by mouth once a day  BP today: 126/70 Prior BP: 140/82 (05/07/2010)  Prior 10 Yr Risk Heart Disease: Not enough information (05/06/2009)  Labs Reviewed: K+: 4.6 (05/07/2010) Creat: : 0.8 (05/07/2010)   Chol: 235 (05/06/2009)   HDL: 40.20 (05/06/2009)   TG: 342.0 (05/06/2009)  Problem # 3:  HYPERLIPIDEMIA (ICD-272.4) Assessment: Unchanged  Her updated medication list for this problem includes:    Crestor 10 Mg Tabs (Rosuvastatin calcium) .[name]... Take 1 tablet by mouth once a day  Labs Reviewed: SGOT: 45 (05/07/2010)   SGPT: 41 (05/07/2010)  Lipid Goals: Chol Goal: 200 (12/27/2009)   HDL Goal: 40 (12/27/2009)   LDL Goal: 100 (12/27/2009)   TG Goal: 150 (12/27/2009)  Prior 10 Yr Risk Heart Disease: Not enough information (05/06/2009)   HDL:40.20 (05/06/2009)  Chol:235 (05/06/2009)  Trig:342.0 (05/06/2009)  Problem # 4:  DIABETES MELLITUS, TYPE II (ICD-250.00) Assessment: Improved  Her updated medication list for this problem includes:    Exforge 10-320 Mg Tabs  (Amlodipine besylate-valsartan) .[name]... Take 1 tablet by mouth once a day    Actos 30 Mg Tabs (Pioglitazone hcl) ..... One by mouth once daily for diabetes    Metformin Hcl 850 Mg Tabs (Metformin hcl) ..... One by mouth two times a day for diabetes  Labs Reviewed: Creat: 0.8 (05/07/2010)     Last Eye Exam: normal (02/20/2009) Reviewed HgBA1c results: 5.7 (05/07/2010)  6.0 (02/05/2010)  Problem # 5:  ANEMIA-NOS (ICD-285.9) Assessment: Unchanged  Her updated medication list for this problem includes:    Feosol 200 (65 Fe) Mg Tabs (Ferrous sulfate dried) ..... One by mouth two times a day with food  Hgb: 11.0 (05/07/2010)   Hct: 32.6 (05/07/2010)   Platelets: 349.0 (05/07/2010) RBC: 3.81 (05/07/2010)   RDW: 20.9 (05/07/2010)   WBC: 10.9 (05/07/2010) MCV: 85.7 (05/07/2010)   MCHC: 33.7 (05/07/2010) Iron: 87 (05/07/2010)   % Sat: 17.7 (05/07/2010) B12: 378 (02/05/2010)   Folate: 11.5 (02/05/2010)   TSH: 2.31 (05/07/2010)  Problem # 6:  DEPRESSION (ICD-311) Assessment: Unchanged  Her updated medication list for this problem includes:    Prozac 20 Mg Caps (Fluoxetine hcl) .[name]... Take 1 tablet by mouth three times a day    Xanax 2 Mg Tabs (Alprazolam) .[name]... Take 1 tablet by mouth three times a day  Discussed treatment options, including trial of antidpressant medication. Will refer to behavioral health. Follow-up call in in 24-48 hours and recheck in 2 weeks, sooner as needed. Patient agrees to call if any worsening of symptoms or thoughts of doing harm arise. Verified that the patient has no suicidal ideation at this time.   Complete Medication List: 1)  Coreg  25 Mg Tabs (Carvedilol) .[name].. 1 by mouth two times a day 2)  Crestor 10 Mg Tabs (Rosuvastatin calcium) .... Take 1 tablet by mouth once a day 3)  Prozac 20 Mg Caps (Fluoxetine hcl) .... Take 1 tablet by mouth three times a day 4)  Xanax 2 Mg Tabs (Alprazolam) .... Take 1 tablet by mouth three times a day 5)  Lamictal 200 Mg Tabs  (Lamotrigine) .... Take 1 tablet by mouth once a day 6)  Singulair 10 Mg Tabs (Montelukast sodium) .... Take 1 tablet by mouth once a day 7)  Kapidex 60 Mg Cpdr (Dexlansoprazole) .... Once daily for acid reflux 8)  Exforge 10-320 Mg Tabs (Amlodipine besylate-valsartan) .... Take 1 tablet by mouth once a day 9)  Bayer Contour Monitor W/device Kit (Blood glucose monitoring suppl) .... Use bid 10)  Bayer Contour Test Strp (Glucose blood) .... Use bid 11)  Ventolin Hfa 108 (90 Base) Mcg/act Aers (Albuterol sulfate) .... Use as directed 12)  Actos 30 Mg Tabs (Pioglitazone hcl) .... One by mouth once daily for diabetes 13)  Metformin Hcl 850 Mg Tabs (Metformin hcl) .... One by mouth two times a day for diabetes 14)  Feosol 200 (65 Fe) Mg Tabs (Ferrous sulfate dried) .... One by mouth two times a day with food  Lipid Assessment/Plan:      Based on NCEP/ATP III, the patient's risk factor category is "history of diabetes".  The patient's lipid goals are as follows: Total cholesterol goal is 200; LDL cholesterol goal is 100; HDL cholesterol goal is 40; Triglyceride goal is 150.    Patient Instructions: 1)  Please schedule a follow-up appointment in 4 months. 2)  It is important that you exercise regularly at least 20 minutes 5 times a week. If you develop chest pain, have severe difficulty breathing, or feel very tired , stop exercising immediately and seek medical attention. 3)  You need to lose weight. Consider a lower calorie diet and regular exercise.  4)  Check your blood sugars regularly. If your readings are usually above 200 or below 70 you should contact our office. 5)  It is important that your Diabetic A1c level is checked every 3 months. 6)  See your eye doctor yearly to check for diabetic eye damage. 7)  Check your feet each night for sore areas, calluses or signs of infection. 8)  Check your Blood Pressure regularly. If it is above 130/80: you should make an appointment.

## 2011-01-15 NOTE — Progress Notes (Signed)
Summary: Checking on Nitro pills  Phone Note Call from Patient Call back at Home Phone [phone] Call back at [phone]    Caller: Patient Summary of Call: Pt calling checking on Nitro pills that was going to called in yesterday Initial call taken by: Judie Grieve,  June 25, 2010 9:10 AM  Follow-up for Phone Call        lm for pt RX sent in Follow-up by: Charolotte Capuchin, RN,  June 25, 2010 9:31 AM    New/Updated Medications: NITROSTAT 0.4 MG SUBL (NITROGLYCERIN) one every 5 mins under tongue for chest pain up to 3 times.  If not resolved call 911 Prescriptions: NITROSTAT 0.4 MG SUBL (NITROGLYCERIN) one every 5 mins under tongue for chest pain up to 3 times.  If not resolved call 911  #[phone] x prn   Entered by:   Charolotte Capuchin, RN   Authorized by:   Rollene Rotunda, MD, [facility]   Signed by:   Charolotte Capuchin, RN on 06/25/2010   Method used:   Electronically to        [facility] (retail)       [address]       [place], [place]  [address]       Ph: [phone]       Fax: [phone]   RxID:   [id number]

## 2011-01-15 NOTE — Medication Information (Signed)
Summary: Singulair Approved/[employer]  Singulair Approved/[employer]   Imported By: Sherian Rein 02/27/2010 14:04:56  _____________________________________________________________________  External Attachment:    Type:   Image     Comment:   External Document

## 2011-01-15 NOTE — Progress Notes (Signed)
    PAP Screening:    Hx Cervical Dysplasia in last 5 yrs? No    3 normal PAP smears in last 5 yrs? No    Last PAP smear:  05/22/2003    Reviewed PAP smear recommendations:  patient defers to GYN provider  Mammogram Screening:    Last Mammogram:  11/18/2010  Mammogram Results:    Date of Exam:  11/18/2010    Results:  Normal Bilateral  Osteoporosis Risk Assessment:  Risk Factors for Fracture or Low Bone Density:   Smoking status:       never  Immunization & Chemoprophylaxis:    Tetanus vaccine: Tdap  (02/05/2010)    Influenza vaccine: Fluvax 3+  (09/04/2010)

## 2011-01-15 NOTE — Letter (Signed)
Summary: Results Follow-up Letter  [facility]  [address] [place], [place] [address]   Phone: [phone]  Fax: [phone]    10/29/2009  [address] [place], [place]  [address]  Dear Ms. Weiler,   The following are the results of your recent test(s):  Test     Result     Blood sugar     high Liver/kidney   normal CBC       mild anemia Urine       normal Thyroid     normal _________________________________________________________  Please call for an appointment in 1-2 months _________________________________________________________ _________________________________________________________ _________________________________________________________  Sincerely,  Sanda Linger MD [facility]

## 2011-01-15 NOTE — Letter (Signed)
Summary: Lipid Letter  [facility]  [address] [place], [place] [address]   Phone: [phone]  Fax: [phone]    05/06/2009  Milissa Fesperman [address] [place], [place]  [address]  Dear Ms. Cinnamon:  We have carefully reviewed your last lipid profile from  and the results are noted below with a summary of recommendations for lipid management.    Cholesterol:       235     Goal: <200   HDL "good" Cholesterol:   41.32     Goal: >40   LDL "bad" Cholesterol:   133     Goal: <130   Triglycerides:       342.0     Goal: <150, WOW!        TLC Diet (Therapeutic Lifestyle Change): Saturated Fats & Transfatty acids should be kept < 7% of total calories ***Reduce Saturated Fats Polyunstaurated Fat can be up to 10% of total calories Monounsaturated Fat Fat can be up to 20% of total calories Total Fat should be no greater than 25-35% of total calories Carbohydrates should be 50-60% of total calories Protein should be approximately 15% of total calories Fiber should be at least 20-30 grams a day ***Increased fiber may help lower LDL Total Cholesterol should be < 200mg /day Consider adding plant stanol/sterols to diet (example: Benacol spread) ***A higher intake of unsaturated fat may reduce Triglycerides and Increase HDL    Adjunctive Measures (may lower LIPIDS and reduce risk of Heart Attack) include: Aerobic Exercise (20-30 minutes 3-4 times a week) Limit Alcohol Consumption Weight Reduction Aspirin 75-81 mg a day by mouth (if not allergic or contraindicated) Dietary Fiber 20-30 grams a day by mouth     Current Medications: 1)    Coreg 25 Mg Tabs (Carvedilol) .... Bidtab 2)    Crestor 10 Mg Tabs (Rosuvastatin calcium) .... Take 1 tablet by mouth once a day 3)    Prozac 20 Mg Caps (Fluoxetine hcl) .... Take 1 tablet by mouth three times a day 4)    Xanax 2 Mg Tabs (Alprazolam) .... Take 1 tablet by mouth three times a day 5)    Lamictal 200 Mg Tabs  (Lamotrigine) .... Take 1 tablet by mouth once a day 6)    Singulair 10 Mg Tabs (Montelukast sodium) .... Take 1 tablet by mouth once a day 7)    Kapidex 60 Mg Cpdr (Dexlansoprazole) .... Once daily for acid reflux 8)    Exforge 10-320 Mg Tabs (Amlodipine besylate-valsartan) .... Take 1 tablet by mouth once a day 9)    Metformin Hcl 500 Mg Tabs (Metformin hcl) .... 1/2 tab two times a day  If you have any questions, please call. We appreciate being able to work with you.   Sincerely,    [facility] Etta Grandchild MD

## 2011-01-15 NOTE — Assessment & Plan Note (Signed)
Summary: FLU VAC  TLJ STC-PER PT RS STC  Nurse Visit   Allergies: 1)  ! Penicillin  Orders Added: 1)  Flu Vaccine 26yrs + [90658] 2)  Administration Flu vaccine - MCR [G0008]     Flu Vaccine Consent Questions     Do you have a history of severe allergic reactions to this vaccine? no    Any prior history of allergic reactions to egg and/or gelatin? no    Do you have a sensitivity to the preservative Thimersol? no    Do you have a past history of Guillan-Barre Syndrome? no    Do you currently have an acute febrile illness? no    Have you ever had a severe reaction to latex? no    Vaccine information given and explained to patient? yes    Are you currently pregnant? no    Lot Number:[id number]   Exp Date:06/12/2010   Site Given  Left Deltoid IMu

## 2011-01-15 NOTE — Medication Information (Signed)
Summary: Prior Auth for Actos/[employer]  Prior Auth for Actos/[employer]   Imported By: Sherian Rein 02/27/2010 08:27:58  _____________________________________________________________________  External Attachment:    Type:   Image     Comment:   External Document

## 2011-01-15 NOTE — Assessment & Plan Note (Signed)
Summary: NEW/SECURE HORIZIONS/ $50 /NWS   Vital Signs:  Patient profile:   65 year old female Height:      66 inches Weight:      265 pounds BMI:     42.93 O2 Sat:      98 % Temp:     97.5 degrees F oral Pulse rate:   92 / minute Pulse rhythm:   regular BP sitting:   146 / 82  (left arm) Cuff size:   large  Vitals Entered By: Rock Nephew CMA (May 06, 2009 8:56 AM)  Nutrition Counseling: Patient's BMI is greater than 25 and therefore counseled on weight management options.  Primary Care Provider:  Etta Grandchild MD   History of Present Illness: this is a new patient to me. She comes in seeking new primary care. She states she is concerned that her high blood sugars have been a problem lately. She feels like she has let herself go with weight gain because she spends more time taking care of her ill husband  who is on dialysis and her son with cerebral  palsy. She has a history of severe gastroesophageal reflux disease whcih has causd  chest pain . She reports having endoscopy with Dr. Christella Hartigan, it showed a hiatal hernia but nothing else. She has had 2 cardiac catheterizations done by Dr. Marni Griffon. Last  one was in 2080 and she reports it as being completely normal. She said she has belching and severe heartburn. She passes blood because she thinks she has hemorrhoids. She has had multiple procedures for hemorrhoids. she is currently disabled due to severe depression. She has had approximately 3 admissions for depression and was disabled in 2004 when she had a nervous breakdown at work. She sees Dr. Evelene Croon.  Dyspepsia History:      The patient has positive alarm features of dyspepsia which include history of anemia.  There is a prior history of GERD.  She notes that it has been less than 12 months since the last episode of GERD and that there have been breakthrough symptoms despite maximum H-2 blocker or PPI therapy.  The patient does not have a prior history of documented ulcer disease.  The  dominant symptom is heartburn or acid reflux.  An H-2 blocker medication is not currently being taken.  She has no history of a positive H. Pylori serology.  A prior EGD has been done which showed moderate or severe esophagitis.    Hypertension History:      She complains of chest pain, but denies headache, palpitations, dyspnea with exertion, orthopnea, PND, peripheral edema, visual symptoms, neurologic problems, syncope, and side effects from treatment.  She notes no problems with any antihypertensive medication side effects.        Positive major cardiovascular risk factors include female age 30 years old or older, diabetes, hyperlipidemia, and hypertension.  Negative major cardiovascular risk factors include negative family history for ischemic heart disease and non-tobacco-user status.        Further assessment for target organ damage reveals no history of ASHD, cardiac end-organ damage (CHF/LVH), stroke/TIA, peripheral vascular disease, renal insufficiency, or hypertensive retinopathy.      Preventive Screening-Counseling & Management     Alcohol drinks/day: 0     Smoking Status: never     Does Patient Exercise: no      Drug Use:  no.    Current Medications (verified): 1)  Coreg 25 Mg Tabs (Carvedilol) .... Bidtab 2)  Crestor 10 Mg Tabs (  Rosuvastatin Calcium) .... Take 1 Tablet By Mouth Once A Day 3)  Prozac 20 Mg Caps (Fluoxetine Hcl) .... Take 1 Tablet By Mouth Three Times A Day 4)  Xanax 2 Mg Tabs (Alprazolam) .... Take 1 Tablet By Mouth Three Times A Day 5)  Lamictal 200 Mg Tabs (Lamotrigine) .... Take 1 Tablet By Mouth Once A Day 6)  Singulair 10 Mg Tabs (Montelukast Sodium) .... Take 1 Tablet By Mouth Once A Day 7)  Reglan 10 Mg Tabs (Metoclopramide Hcl) .... Take 1 Tablet By Mouth Four Times A Day 8)  Exforge 10-320 Mg Tabs (Amlodipine Besylate-Valsartan) .... Take 1 Tablet By Mouth Once A Day 9)  Metformin Hcl 500 Mg Tabs (Metformin Hcl) .... 1/2 Tab Two Times A  Day  Allergies (verified): 1)  ! Penicillin  Past History:  Past Medical History:    Anemia-NOS    Depression    Diabetes mellitus, type II    GERD    Hyperlipidemia    Hypertension  Past Surgical History:    Hemorrhoidectomy  Family History:    Family History of Alcoholism/Addiction    Family History Hypertension    Family History of Cardiovascular disorder  Social History:    Married    Never Smoked    Alcohol use-no    Drug use-no    Regular exercise-no    Disabled    Smoking Status:  never    Drug Use:  no    Does Patient Exercise:  no  Review of Systems       The patient complains of weight gain, vision loss, severe indigestion/heartburn, and depression.  The patient denies anorexia, fever, weight loss, decreased hearing, chest pain, syncope, dyspnea on exertion, peripheral edema, prolonged cough, headaches, hemoptysis, abdominal pain, melena, hematochezia, enlarged lymph nodes, angioedema, and breast masses.    Physical Exam  General:  alert, well-developed, well-nourished, well-hydrated, cooperative to examination, good hygiene, and overweight-appearing.   Head:  normocephalic and atraumatic.   Eyes:  vision grossly intact, pupils equal, and pupils round.   Mouth:  Oral mucosa and oropharynx without lesions or exudates.  Teeth in good repair. Neck:  supple, full ROM, no masses, no carotid bruits, no cervical lymphadenopathy, and no neck tenderness.   Lungs:  Normal respiratory effort, chest expands symmetrically. Lungs are clear to auscultation, no crackles or wheezes. Heart:  Normal rate and regular rhythm. S1 and S2 normal without gallop, murmur, click, rub or other extra sounds. Abdomen:  soft, non-tender, normal bowel sounds, no distention, no masses, no guarding, no hepatomegaly, and no splenomegaly.   Msk:  No deformity or scoliosis noted of thoracic or lumbar spine.   Pulses:  R and L carotid,radial,femoral,dorsalis pedis and posterior tibial pulses are  full and equal bilaterally Extremities:  No clubbing, cyanosis, edema, or deformity noted with normal full range of motion of all joints.   Neurologic:  No cranial nerve deficits noted. Station and gait are normal. Plantar reflexes are down-going bilaterally. DTRs are symmetrical throughout. Sensory, motor and coordinative functions appear intact. Skin:  turgor normal, color normal, and no rashes.   Psych:  Oriented X3, memory intact for recent and remote, normally interactive, good eye contact, not anxious appearing, not depressed appearing, not agitated, and subdued.    Diabetes Management Exam:    Foot Exam (with socks and/or shoes not present):       Sensory-Pinprick/Light touch:          Left medial foot (L-4): normal  Left dorsal foot (L-5): normal          Left lateral foot (S-1): normal          Right medial foot (L-4): normal          Right dorsal foot (L-5): normal          Right lateral foot (S-1): normal       Sensory-Monofilament:          Left foot: normal          Right foot: normal       Inspection:          Left foot: normal          Right foot: normal       Nails:          Left foot: normal          Right foot: normal    Eye Exam:       Eye Exam done elsewhere          Date: 02/20/2009          Results: normal          Done by: Dione Booze   Impression & Recommendations:  Problem # 1:  GERD (ICD-530.81) Assessment Deteriorated I have recommended that she discontinue the Reglan as it may be affecting her mental health. It also sounds like it's not helping with her heartburn and belching. We'll start proton pump inhibitor therapy. Her updated medication list for this problem includes:    Kapidex 60 Mg Cpdr (Dexlansoprazole) ..... Once daily for acid reflux  Orders: TLB-Lipid Panel (80061-LIPID) TLB-BMP (Basic Metabolic Panel-BMET) (80048-METABOL) TLB-CBC Platelet - w/Differential (85025-CBCD) TLB-Hepatic/Liver Function Pnl (80076-HEPATIC) TLB-TSH (Thyroid  Stimulating Hormone) (84443-TSH) TLB-B12 + Folate Pnl (65784_69629-B28/UXL) TLB-IBC Pnl (Iron/FE;Transferrin) (83550-IBC) TLB-CK Total Only(Creatine Kinase/CPK) (82550-CK) TLB-A1C / Hgb A1C (Glycohemoglobin) (83036-A1C) TLB-Microalbumin/Creat Ratio, Urine (82043-MALB) TLB-Udip w/ Micro (81001-URINE)  Problem # 2:  ROUTINE GENERAL MEDICAL EXAM@HEALTH  CARE FACL (ICD-V70.0) she agrees to schedule a full physical with breast exam and Pap smear within the near future. Orders: Radiology Referral (Radiology)  Problem # 3:  HYPERTENSION (ICD-401.9) Assessment: Improved  Her updated medication list for this problem includes:    Coreg 25 Mg Tabs (Carvedilol) ..... Bidtab    Exforge 10-320 Mg Tabs (Amlodipine besylate-valsartan) .[name]... Take 1 tablet by mouth once a day  Orders: TLB-Lipid Panel (80061-LIPID) TLB-BMP (Basic Metabolic Panel-BMET) (80048-METABOL) TLB-CBC Platelet - w/Differential (85025-CBCD) TLB-Hepatic/Liver Function Pnl (80076-HEPATIC) TLB-TSH (Thyroid Stimulating Hormone) (84443-TSH) TLB-B12 + Folate Pnl (24401_02725-D66/YQI) TLB-IBC Pnl (Iron/FE;Transferrin) (83550-IBC) TLB-CK Total Only(Creatine Kinase/CPK) (82550-CK) TLB-A1C / Hgb A1C (Glycohemoglobin) (83036-A1C) TLB-Microalbumin/Creat Ratio, Urine (82043-MALB) TLB-Udip w/ Micro (81001-URINE) Nutrition Referral (Nutrition)  Problem # 4:  HYPERLIPIDEMIA (ICD-272.4) Assessment: Unchanged  Her updated medication list for this problem includes:    Crestor 10 Mg Tabs (Rosuvastatin calcium) .[name]... Take 1 tablet by mouth once a day  Orders: TLB-Lipid Panel (80061-LIPID) TLB-BMP (Basic Metabolic Panel-BMET) (80048-METABOL) TLB-CBC Platelet - w/Differential (85025-CBCD) TLB-Hepatic/Liver Function Pnl (80076-HEPATIC) TLB-TSH (Thyroid Stimulating Hormone) (84443-TSH) TLB-B12 + Folate Pnl (34742_59563-O75/IEP) TLB-IBC Pnl (Iron/FE;Transferrin) (83550-IBC) TLB-CK Total Only(Creatine Kinase/CPK) (82550-CK) TLB-A1C / Hgb A1C  (Glycohemoglobin) (83036-A1C) TLB-Microalbumin/Creat Ratio, Urine (82043-MALB) TLB-Udip w/ Micro (81001-URINE) Nutrition Referral (Nutrition)  Problem # 5:  DIABETES MELLITUS, TYPE II (ICD-250.00) Assessment: Deteriorated  Her updated medication list for this problem includes:    Exforge 10-320 Mg Tabs (Amlodipine besylate-valsartan) .[name]... Take 1 tablet by mouth once a day    Metformin Hcl 500 Mg Tabs (Metformin  hcl) ..... 1/2 tab two times a day  Orders: TLB-Lipid Panel (80061-LIPID) TLB-BMP (Basic Metabolic Panel-BMET) (80048-METABOL) TLB-CBC Platelet - w/Differential (85025-CBCD) TLB-Hepatic/Liver Function Pnl (80076-HEPATIC) TLB-TSH (Thyroid Stimulating Hormone) (84443-TSH) TLB-B12 + Folate Pnl (16109_60454-U98/JXB) TLB-IBC Pnl (Iron/FE;Transferrin) (83550-IBC) TLB-CK Total Only(Creatine Kinase/CPK) (82550-CK) TLB-A1C / Hgb A1C (Glycohemoglobin) (83036-A1C) TLB-Microalbumin/Creat Ratio, Urine (82043-MALB) TLB-Udip w/ Micro (81001-URINE) Nutrition Referral (Nutrition)  Problem # 6:  DEPRESSION (ICD-311) Assessment: Unchanged  Her updated medication list for this problem includes:    Prozac 20 Mg Caps (Fluoxetine hcl) .[name]... Take 1 tablet by mouth three times a day    Xanax 2 Mg Tabs (Alprazolam) .[name]... Take 1 tablet by mouth three times a day  Problem # 7:  ANEMIA-NOS (ICD-285.9) Assessment: Comment Only she states that this has never been investigated with respect to B12 or iron deficiency; I  will start there and go further pending those results. Orders: TLB-Lipid Panel (80061-LIPID) TLB-BMP (Basic Metabolic Panel-BMET) (80048-METABOL) TLB-CBC Platelet - w/Differential (85025-CBCD) TLB-Hepatic/Liver Function Pnl (80076-HEPATIC) TLB-TSH (Thyroid Stimulating Hormone) (84443-TSH) TLB-B12 + Folate Pnl (14782_95621-H08/MVH) TLB-IBC Pnl (Iron/FE;Transferrin) (83550-IBC) TLB-CK Total Only(Creatine Kinase/CPK) (82550-CK) TLB-A1C / Hgb A1C (Glycohemoglobin)  (83036-A1C) TLB-Microalbumin/Creat Ratio, Urine (82043-MALB) TLB-Udip w/ Micro (81001-URINE)  Complete Medication List: 1)  Coreg 25 Mg Tabs (Carvedilol) .... Bidtab 2)  Crestor 10 Mg Tabs (Rosuvastatin calcium) .... Take 1 tablet by mouth once a day 3)  Prozac 20 Mg Caps (Fluoxetine hcl) .... Take 1 tablet by mouth three times a day 4)  Xanax 2 Mg Tabs (Alprazolam) .... Take 1 tablet by mouth three times a day 5)  Lamictal 200 Mg Tabs (Lamotrigine) .... Take 1 tablet by mouth once a day 6)  Singulair 10 Mg Tabs (Montelukast sodium) .... Take 1 tablet by mouth once a day 7)  Kapidex 60 Mg Cpdr (Dexlansoprazole) .... Once daily for acid reflux 8)  Exforge 10-320 Mg Tabs (Amlodipine besylate-valsartan) .... Take 1 tablet by mouth once a day 9)  Metformin Hcl 500 Mg Tabs (Metformin hcl) .... 1/2 tab two times a day  Hypertension Assessment/Plan:      The patient's hypertensive risk group is category C: Target organ damage and/or diabetes.  Today's blood pressure is 146/82.  Her blood pressure goal is < 130/80.  PAP Screening:    Hx Cervical Dysplasia in last 5 yrs? No    3 normal PAP smears in last 5 yrs? No    Last PAP smear:  05/22/2003  PAP Smear Results:    Date of Exam:  05/22/2003    Results:  Normal  Mammogram Screening:    Last Mammogram:  06/27/2003  Mammogram Results:    Date of Exam:  06/27/2003    Results:  Normal Bilateral  Osteoporosis Risk Assessment:  Risk Factors for Fracture or Low Bone Density:   Smoking status:       never  Patient Instructions: 1)  Please schedule a follow-up appointment in 1 month. 2)  It is important that you exercise regularly at least 20 minutes 5 times a week. If you develop chest pain, have severe difficulty breathing, or feel very tired , stop exercising immediately and seek medical attention. 3)  You need to lose weight. Consider a lower calorie diet and regular exercise.  4)  Check your blood sugars regularly. If your readings are  usually above 150  or below 70 you should contact our office. 5)  It is important that your Diabetic A1c level is checked every 3 months. 6)  See your eye  doctor yearly to check for diabetic eye damage. 7)  Check your feet each night for sore areas, calluses or signs of infection. 8)  Check your Blood Pressure regularly. If it is above 130/80: you should make an appointment. Prescriptions: EXFORGE 10-320 MG TABS (AMLODIPINE BESYLATE-VALSARTAN) Take 1 tablet by mouth once a day  #112 x 0   Entered and Authorized by:   Etta Grandchild MD   Signed by:   Etta Grandchild MD on 05/06/2009   Method used:   Historical   RxID:   [id number] CRESTOR 10 MG TABS (ROSUVASTATIN CALCIUM) Take 1 tablet by mouth once a day  #84 x 0   Entered and Authorized by:   Etta Grandchild MD   Signed by:   Etta Grandchild MD on 05/06/2009   Method used:   Historical   RxID:   [id number] KAPIDEX 60 MG CPDR (DEXLANSOPRAZOLE) once daily for acid reflux  #75 x 0   Entered and Authorized by:   Etta Grandchild MD   Signed by:   Etta Grandchild MD on 05/06/2009   Method used:   Historical   RxID:   [id number]

## 2011-01-15 NOTE — Assessment & Plan Note (Signed)
Summary: Cardiology Nuclear Testing  Nuclear Med Background Indications for Stress Test: Evaluation for Ischemia   History: Asthma, Heart Catheterization, Myocardial Perfusion Study  History Comments: '07 Heart Cath: EF=70%, NL coronaries, mild N/O plaque '08 MPS: (-) ischemia/infarct  Symptoms: Chest Pain, Chest Pain with Exertion, Chest Pressure, Chest Pressure with Exertion, Diaphoresis, Dizziness, Nausea, SOB    Nuclear Pre-Procedure Cardiac Risk Factors: Family History - CAD, Hypertension, Lipids, NIDDM Caffeine/Decaff Intake: none NPO After: 7:00 PM Lungs: clear IV 0.9% NS with Angio Cath: 18g     IV Site: (R) AC IV Started by: Eliezer Lofts EMT-P Chest Size (in) 42     Cup Size C     Height (in): 66 Weight (lb): 267 BMI: 43.25 Tech Comments: Coreg taken this am. CBG '141mg'$ /dl @ 7:30 am.  This patient was scheduled for treadmill Myoview and was only able to walk 1:57. For further testing, she should be scheduled for RX Myoview.   Nuclear Med Study 1 or 2 day study:  2 day     Stress Test Type:  Carlton Adam Reading MD:  Dorris Carnes, MD     Referring MD:  J.Hochrein Resting Radionuclide:  Technetium 53mTetrofosmin     Resting Radionuclide Dose:  33.0 mCi  Stress Radionuclide:  Technetium 942metrofosmin     Stress Radionuclide Dose:  33.0 mCi   Stress Protocol Exercise Time (min):  1:57 min     Max HR:  111 bpm     Predicted Max HR:  150000000pm  Max Systolic BP: 11XX123456m Hg     Percent Max HR:  70.25 %     METS: 4.5 Rate Pressure Product:  [id number]: 0.4 mg   Stress Test Technologist:  SaPerrin MalteseMT-P     Nuclear Technologist:  ElVedia PereyraNMT  Rest Procedure  Myocardial perfusion imaging was performed at rest 45 minutes following the intravenous administration of Myoview Technetium 9954mtrofosmin.  Stress Procedure  The patient received IV Lexiscan 0.4 mg over 15-seconds.  Myoview injected at 30-seconds.  There were no significant changes with infusion.   Quantitative spect images were obtained after a 45 minute delay.  QPS Raw Data Images:  Extensive soft tissue (breast, diaphragm, subcutaneous fat) surround heart. Stress Images:  Thinning in the inferosepta region (base, miminmally mid).  Otherwise normal perfusion. Rest Images:  No significant change from the rest images. Transient Ischemic Dilatation:  1.05  (Normal <1.22)  Lung/Heart Ratio:  .31  (Normal <0.45)  Quantitative Gated Spect Images QGS EDV:  75 ml QGS ESV:  20 ml QGS EF:  73 %   Overall Impression  Exercise Capacity: Lexiscan protocol BP Response: Normal blood pressure response. Clinical Symptoms: No chest pain ECG Impression: No significant ST segment change suggestive of ischemia. Overall Impression: Probable normal perfusion and soft tissue attenuation (diaphragm).  NO evidence of significant ischemia or scar.  Appended Document: Cardiology Nuclear Testing Negative stress perfusion study.  Appended Document: Cardiology Nuclear Testing PT [place]./[place]

## 2011-01-15 NOTE — Assessment & Plan Note (Signed)
Summary: vertigo/#/cd   Vital Signs:  Patient profile:   65 year old female Height:      66 inches Weight:      262 pounds BMI:     42.44 O2 Sat:      99 % on Room air Temp:     97.7 degrees F oral Pulse rate:   89 / minute Pulse rhythm:   regular BP sitting:   120 / 70  (left arm) Cuff size:   large  Vitals Entered By: Rock Nephew CMA (July 18, 2010 1:49 PM)  O2 Flow:  Room air CC: ER follow up/ pt c/o dizziness, lightheadedness, confusion and shakiness Is Patient Diabetic? Yes Did you bring your meter with you today? No Pain Assessment Patient in pain? no        Primary Care Provider:  Etta Grandchild MD  CC:  ER follow up/ pt c/o dizziness, lightheadedness, and confusion and shakiness.  History of Present Illness:  Follow-Up Visit      This is a 65 year old woman who presents for Follow-up visit.  The patient complains of dizziness, but denies chest pain, palpitations, syncope, low blood sugar symptoms, high blood sugar symptoms, edema, SOB, DOE, PND, and orthopnea.  Since the last visit the patient notes a recent ED visit-one week ago for dizziness ( CT of head, EKG, and labs were all normal ) and being seen by a specialist- stress test with Dr. Antoine Poche yesterday.  The patient reports taking meds as prescribed, monitoring BP, monitoring blood sugars, and dietary noncompliance.  When questioned about possible medication side effects, the patient notes none.    Preventive Screening-Counseling & Management  Alcohol-Tobacco     Alcohol drinks/day: 0     Smoking Status: never  Hep-HIV-STD-Contraception     Hepatitis Risk: no risk noted     HIV Risk: no risk noted     STD Risk: no risk noted      Drug Use:  no.    Clinical Review Panels:  Diabetes Management   HgBA1C:  5.7 (05/07/2010)   Creatinine:  0.8 (05/07/2010)   Last Dilated Eye Exam:  normal (02/20/2009)   Last Foot Exam:  yes (07/18/2010)   Last Flu Vaccine:  Fluvax 3+ (09/25/2009)  CBC   WBC:   10.9 (05/07/2010)   RBC:  3.81 (05/07/2010)   Hgb:  11.0 (05/07/2010)   Hct:  32.6 (05/07/2010)   Platelets:  349.0 (05/07/2010)   MCV  85.7 (05/07/2010)   MCHC  33.7 (05/07/2010)   RDW  20.9 (05/07/2010)   PMN:  65.0 (05/07/2010)   Lymphs:  24.9 (05/07/2010)   Monos:  8.2 (05/07/2010)   Eosinophils:  1.6 (05/07/2010)   Basophil:  0.3 (05/07/2010)  Complete Metabolic Panel   Glucose:  97 (05/07/2010)   Sodium:  143 (05/07/2010)   Potassium:  4.6 (05/07/2010)   Chloride:  105 (05/07/2010)   CO2:  27 (05/07/2010)   BUN:  13 (05/07/2010)   Creatinine:  0.8 (05/07/2010)   Albumin:  3.8 (07/09/2010)   Total Protein:  7.1 (07/09/2010)   Calcium:  9.9 (05/07/2010)   Total Bili:  0.5 (07/09/2010)   Alk Phos:  94 (07/09/2010)   SGPT (ALT):  29 (07/09/2010)   SGOT (AST):  39 (07/09/2010)   Medications Prior to Update: 1)  Coreg 25 Mg Tabs (Carvedilol) .[name].. 1 By Mouth Two Times A Day 2)  Crestor 10 Mg Tabs (Rosuvastatin Calcium) .... Take 1 Tablet By Mouth Once A  Day 3)  Xanax 2 Mg Tabs (Alprazolam) .... Take 1 Tablet By Mouth Three Times A Day 4)  Lamictal 200 Mg Tabs (Lamotrigine) .... Take 1 Tablet By Mouth Once A Day 5)  Singulair 10 Mg Tabs (Montelukast Sodium) .... Take 1 Tablet By Mouth Once A Day 6)  Kapidex 60 Mg Cpdr (Dexlansoprazole) .... Hold 7)  Exforge 10-320 Mg Tabs (Amlodipine Besylate-Valsartan) .... Take 1 Tablet By Mouth Once A Day 8)  Bayer Contour Monitor W/device Kit (Blood Glucose Monitoring Suppl) .... Use Bid 9)  Bayer Contour Test  Strp (Glucose Blood) .... Use Bid 10)  Ventolin Hfa 108 (90 Base) Mcg/act Aers (Albuterol Sulfate) .... Use As Directed 11)  Actos 30 Mg Tabs (Pioglitazone Hcl) .... One By Mouth Once Daily For Diabetes 12)  Metformin Hcl 850 Mg Tabs (Metformin Hcl) .... One By Mouth Two Times A Day For Diabetes 13)  Feosol 200 (65 Fe) Mg Tabs (Ferrous Sulfate Dried) .... One By Mouth Two Times A Day With Food 14)  Nortryptilline .[name].. 3 By Mouth  At Bedtime 15)  Nitrostat 0.4 Mg Subl (Nitroglycerin) .... One Every 5 Mins Under Tongue For Chest Pain Up To 3 Times.  If Not Resolved Call 911 16)  Isosorbide Mononitrate Cr 60 Mg Xr24h-Tab (Isosorbide Mononitrate) .... 1/2 Daily  Current Medications (verified): 1)  Coreg 25 Mg Tabs (Carvedilol) .[name].. 1 By Mouth Two Times A Day 2)  Crestor 10 Mg Tabs (Rosuvastatin Calcium) .... Take 1 Tablet By Mouth Once A Day 3)  Xanax 2 Mg Tabs (Alprazolam) .... Take 1 Tablet By Mouth Three Times A Day 4)  Lamictal 200 Mg Tabs (Lamotrigine) .... Take 1 Tablet By Mouth Once A Day 5)  Singulair 10 Mg Tabs (Montelukast Sodium) .... Take 1 Tablet By Mouth Once A Day 6)  Kapidex 60 Mg Cpdr (Dexlansoprazole) .... Hold 7)  Exforge 10-320 Mg Tabs (Amlodipine Besylate-Valsartan) .... Take 1 Tablet By Mouth Once A Day 8)  Bayer Contour Monitor W/device Kit (Blood Glucose Monitoring Suppl) .... Use Bid 9)  Bayer Contour Test  Strp (Glucose Blood) .... Use Bid 10)  Ventolin Hfa 108 (90 Base) Mcg/act Aers (Albuterol Sulfate) .... Use As Directed 11)  Actos 30 Mg Tabs (Pioglitazone Hcl) .... One By Mouth Once Daily For Diabetes 12)  Metformin Hcl 850 Mg Tabs (Metformin Hcl) .... One By Mouth Two Times A Day For Diabetes 13)  Feosol 200 (65 Fe) Mg Tabs (Ferrous Sulfate Dried) .... One By Mouth Two Times A Day With Food 14)  Nortryptilline .[name].. 3 By Mouth At Bedtime 15)  Nitrostat 0.4 Mg Subl (Nitroglycerin) .... One Every 5 Mins Under Tongue For Chest Pain Up To 3 Times.  If Not Resolved Call 911 16)  Isosorbide Mononitrate Cr 60 Mg Xr24h-Tab (Isosorbide Mononitrate) .... 1/2 Daily 17)  Meclizine Hcl 25 Mg Tabs (Meclizine Hcl) .... Take 1 Tablet By Mouth Three Times A Day 18)  Sulfamethoxazole-Tmp Ds 800-160 Mg Tabs (Sulfamethoxazole-Trimethoprim) .... Take 1 Tablet By Mouth Two Times A Day X 5 Days  Allergies (verified): 1)  ! Penicillin  Past History:  Past Medical History: Last updated:  06/24/2010 Anemia-NOS Depression Diabetes mellitus, type II GERD Hyperlipidemia Hypertension Sleep apnea (CPAP) Asthma Hemorrhoids  Past Surgical History: Last updated: 06/24/2010 Benign tumors resected Tubal ligation  Family History: Last updated: 06/24/2010 Family History of Alcoholism/Addiction Family History Hypertension Family History of Cardiovascular disorder (A brother had coronary artery disease dying at age 55. Her mother died at 61 with  alcohol abuse and a myocardial infarction)  Social History: Last updated: 05/06/2009 Married Never Smoked Alcohol use-no Drug use-no Regular exercise-no Disabled  Risk Factors: Alcohol Use: 0 (07/18/2010) Exercise: no (05/06/2009)  Risk Factors: Smoking Status: never (07/18/2010)  Family History: Reviewed history from 06/24/2010 and no changes required. Family History of Alcoholism/Addiction Family History Hypertension Family History of Cardiovascular disorder (A brother had coronary artery disease dying at age 24. Her mother died at 8 with alcohol abuse and a myocardial infarction)  Social History: Reviewed history from 05/06/2009 and no changes required. Married Never Smoked Alcohol use-no Drug use-no Regular exercise-no Disabled  Review of Systems       The patient complains of weight gain and depression.  The patient denies anorexia, fever, weight loss, chest pain, syncope, dyspnea on exertion, peripheral edema, headaches, hemoptysis, abdominal pain, suspicious skin lesions, transient blindness, and difficulty walking.   Neuro:  Complains of memory loss, sensation of room spinning, and weakness; denies brief paralysis, difficulty with concentration, disturbances in coordination, falling down, headaches, inability to speak, numbness, poor balance, seizures, tingling, tremors, and visual disturbances. Psych:  Complains of anxiety, depression, easily angered, easily tearful, irritability, and panic attacks; denies  alternate hallucination ( auditory/visual), sense of great danger, suicidal thoughts/plans, thoughts of violence, unusual visions or sounds, and thoughts /plans of harming others.  Physical Exam  General:  alert, well-developed, well-nourished, well-hydrated, cooperative to examination, good hygiene, and overweight-appearing.   Head:  normocephalic, atraumatic, no abnormalities observed, and no abnormalities palpated.   Mouth:  Oral mucosa and oropharynx without lesions or exudates.  Teeth in good repair. Neck:  supple, full ROM, no masses, no carotid bruits, no cervical lymphadenopathy, and no neck tenderness.   Lungs:  Normal respiratory effort, chest expands symmetrically. Lungs are clear to auscultation, no crackles or wheezes. Heart:  Normal rate and regular rhythm. S1 and S2 normal without gallop, murmur, click, rub or other extra sounds. Abdomen:  soft, non-tender, normal bowel sounds, no distention, no masses, no guarding, no hepatomegaly, and no splenomegaly.   Msk:  normal ROM, no joint tenderness, no joint swelling, no joint warmth, no redness over joints, no joint deformities, no joint instability, and no crepitation.   Pulses:  R and L carotid,radial,femoral,dorsalis pedis and posterior tibial pulses are full and equal bilaterally Extremities:  No clubbing, cyanosis, edema, or deformity noted with normal full range of motion of all joints.   Neurologic:  No cranial nerve deficits noted. Station and gait are normal. Plantar reflexes are down-going bilaterally. DTRs are symmetrical throughout. Sensory, motor and coordinative functions appear intact. Skin:  turgor normal, color normal, no rashes, no suspicious lesions, no ecchymoses, no petechiae, no purpura, no ulcerations, and no edema.   Cervical Nodes:  no anterior cervical adenopathy and no posterior cervical adenopathy.   Axillary Nodes:  no R axillary adenopathy and no L axillary adenopathy.   Inguinal Nodes:  no R inguinal  adenopathy and no L inguinal adenopathy.   Psych:  Oriented X3, memory intact for recent and remote, normally interactive, not anxious appearing, not agitated, not suicidal, not homicidal, dysphoric affect, and subdued.    Diabetes Management Exam:    Foot Exam (with socks and/or shoes not present):       Sensory-Pinprick/Light touch:          Left medial foot (L-4): normal          Left dorsal foot (L-5): normal          Left lateral foot (  S-1): normal          Right medial foot (L-4): normal          Right dorsal foot (L-5): normal          Right lateral foot (S-1): normal       Sensory-Monofilament:          Left foot: normal          Right foot: normal       Inspection:          Left foot: normal          Right foot: normal       Nails:          Left foot: normal          Right foot: normal   Impression & Recommendations:  Problem # 1:  DEPRESSION (ICD-311) Assessment Deteriorated she tells me that she has failed all the possible antidepressants so she will need ECT per Dr. Evelene Croon Her updated medication list for this problem includes:    Xanax 2 Mg Tabs (Alprazolam) .[name]... Take 1 tablet by mouth three times a day  Orders: Psychiatric Referral (Psych)  Problem # 2:  DIABETES MELLITUS, TYPE II (ICD-250.00) Assessment: Unchanged  Her updated medication list for this problem includes:    Exforge 10-320 Mg Tabs (Amlodipine besylate-valsartan) .[name]... Take 1 tablet by mouth once a day    Actos 30 Mg Tabs (Pioglitazone hcl) ..... One by mouth once daily for diabetes    Metformin Hcl 850 Mg Tabs (Metformin hcl) ..... One by mouth two times a day for diabetes  Labs Reviewed: Creat: 0.8 (05/07/2010)     Last Eye Exam: normal (02/20/2009) Reviewed HgBA1c results: 5.7 (05/07/2010)  6.0 (02/05/2010)  Problem # 3:  HYPERTENSION (ICD-401.9) Assessment: Improved  Her updated medication list for this problem includes:    Coreg 25 Mg Tabs (Carvedilol) .[name]... 1 by mouth two times a  day    Exforge 10-320 Mg Tabs (Amlodipine besylate-valsartan) .[name]... Take 1 tablet by mouth once a day  BP today: 120/70 Prior BP: 102/68 (06/24/2010)  Prior 10 Yr Risk Heart Disease: Not enough information (05/06/2009)  Labs Reviewed: K+: 4.6 (05/07/2010) Creat: : 0.8 (05/07/2010)   Chol: 172 (07/09/2010)   HDL: 43.10 (07/09/2010)   TG: 435.0 (07/09/2010)  Complete Medication List: 1)  Coreg 25 Mg Tabs (Carvedilol) .[name].. 1 by mouth two times a day 2)  Crestor 10 Mg Tabs (Rosuvastatin calcium) .... Take 1 tablet by mouth once a day 3)  Xanax 2 Mg Tabs (Alprazolam) .... Take 1 tablet by mouth three times a day 4)  Lamictal 200 Mg Tabs (Lamotrigine) .... Take 1 tablet by mouth once a day 5)  Singulair 10 Mg Tabs (Montelukast sodium) .... Take 1 tablet by mouth once a day 6)  Kapidex 60 Mg Cpdr (Dexlansoprazole) .... Hold 7)  Exforge 10-320 Mg Tabs (Amlodipine besylate-valsartan) .... Take 1 tablet by mouth once a day 8)  Bayer Contour Monitor W/device Kit (Blood glucose monitoring suppl) .... Use bid 9)  Bayer Contour Test Strp (Glucose blood) .... Use bid 10)  Ventolin Hfa 108 (90 Base) Mcg/act Aers (Albuterol sulfate) .... Use as directed 11)  Actos 30 Mg Tabs (Pioglitazone hcl) .... One by mouth once daily for diabetes 12)  Metformin Hcl 850 Mg Tabs (Metformin hcl) .... One by mouth two times a day for diabetes 13)  Feosol 200 (65 Fe) Mg Tabs (Ferrous sulfate dried) .... One by mouth two  times a day with food 14)  Nortryptilline  .[name].. 3 by mouth at bedtime 15)  Nitrostat 0.4 Mg Subl (Nitroglycerin) .... One every 5 mins under tongue for chest pain up to 3 times.  if not resolved call 911 16)  Isosorbide Mononitrate Cr 60 Mg Xr24h-tab (Isosorbide mononitrate) .... 1/2 daily 17)  Meclizine Hcl 25 Mg Tabs (Meclizine hcl) .... Take 1 tablet by mouth three times a day 18)  Sulfamethoxazole-tmp Ds 800-160 Mg Tabs (Sulfamethoxazole-trimethoprim) .... Take 1 tablet by mouth two times a day x 5  days  Patient Instructions: 1)  Please schedule a follow-up appointment in 3 months. 2)  It is important that you exercise regularly at least 20 minutes 5 times a week. If you develop chest pain, have severe difficulty breathing, or feel very tired , stop exercising immediately and seek medical attention. 3)  You need to lose weight. Consider a lower calorie diet and regular exercise.  4)  Check your blood sugars regularly. If your readings are usually above 200 or below 70 you should contact our office. 5)  It is important that your Diabetic A1c level is checked every 3 months. 6)  See your eye doctor yearly to check for diabetic eye damage. 7)  Check your feet each night for sore areas, calluses or signs of infection. 8)  Check your Blood Pressure regularly. If it is above 130/80: you should make an appointment.

## 2011-01-21 ENCOUNTER — Ambulatory Visit: Payer: Self-pay | Admitting: Internal Medicine

## 2011-02-05 ENCOUNTER — Encounter: Payer: Self-pay | Admitting: Internal Medicine

## 2011-02-05 ENCOUNTER — Other Ambulatory Visit: Payer: Medicare PPO

## 2011-02-05 ENCOUNTER — Other Ambulatory Visit: Payer: Self-pay | Admitting: Internal Medicine

## 2011-02-05 ENCOUNTER — Ambulatory Visit (INDEPENDENT_AMBULATORY_CARE_PROVIDER_SITE_OTHER): Payer: Medicare PPO | Admitting: Internal Medicine

## 2011-02-05 DIAGNOSIS — E785 Hyperlipidemia, unspecified: Secondary | ICD-10-CM

## 2011-02-05 DIAGNOSIS — R079 Chest pain, unspecified: Secondary | ICD-10-CM

## 2011-02-05 DIAGNOSIS — R609 Edema, unspecified: Secondary | ICD-10-CM

## 2011-02-05 DIAGNOSIS — R740 Nonspecific elevation of levels of transaminase and lactic acid dehydrogenase [LDH]: Secondary | ICD-10-CM

## 2011-02-05 DIAGNOSIS — K219 Gastro-esophageal reflux disease without esophagitis: Secondary | ICD-10-CM

## 2011-02-05 DIAGNOSIS — R7401 Elevation of levels of liver transaminase levels: Secondary | ICD-10-CM

## 2011-02-05 DIAGNOSIS — Z79899 Other long term (current) drug therapy: Secondary | ICD-10-CM

## 2011-02-05 DIAGNOSIS — G5600 Carpal tunnel syndrome, unspecified upper limb: Secondary | ICD-10-CM | POA: Insufficient documentation

## 2011-02-05 DIAGNOSIS — D649 Anemia, unspecified: Secondary | ICD-10-CM

## 2011-02-05 DIAGNOSIS — R7402 Elevation of levels of lactic acid dehydrogenase (LDH): Secondary | ICD-10-CM | POA: Insufficient documentation

## 2011-02-05 DIAGNOSIS — E119 Type 2 diabetes mellitus without complications: Secondary | ICD-10-CM

## 2011-02-05 DIAGNOSIS — I10 Essential (primary) hypertension: Secondary | ICD-10-CM

## 2011-02-05 LAB — URINALYSIS, ROUTINE W REFLEX MICROSCOPIC
Bilirubin Urine: NEGATIVE
Hgb urine dipstick: NEGATIVE
Ketones, ur: NEGATIVE
Leukocytes, UA: NEGATIVE
Nitrite: NEGATIVE
Specific Gravity, Urine: <=1.005 (ref 1.000–1.030)
Total Protein, Urine: NEGATIVE
Urine Glucose: NEGATIVE
Urobilinogen, UA: 0.2 (ref 0.0–1.0)
pH: 6 (ref 5.0–8.0)

## 2011-02-05 LAB — CBC WITH DIFFERENTIAL/PLATELET
Basophils Absolute: 0 10*3/uL (ref 0.0–0.1)
Basophils Relative: 0.4 % (ref 0.0–3.0)
Eosinophils Absolute: 0.2 10*3/uL (ref 0.0–0.7)
Eosinophils Relative: 2.1 % (ref 0.0–5.0)
HCT: 27.5 % — ABNORMAL LOW (ref 36.0–46.0)
Hemoglobin: 9.2 g/dL — ABNORMAL LOW (ref 12.0–15.0)
Lymphocytes Relative: 30.6 % (ref 12.0–46.0)
Lymphs Abs: 3.4 10*3/uL (ref 0.7–4.0)
MCHC: 33.3 g/dL (ref 30.0–36.0)
MCV: 81.1 fl (ref 78.0–100.0)
Monocytes Absolute: 1 10*3/uL (ref 0.1–1.0)
Monocytes Relative: 9.1 % (ref 3.0–12.0)
Neutro Abs: 6.4 10*3/uL (ref 1.4–7.7)
Neutrophils Relative %: 57.8 % (ref 43.0–77.0)
Platelets: 362 10*3/uL (ref 150.0–400.0)
RBC: 3.39 Mil/uL — ABNORMAL LOW (ref 3.87–5.11)
RDW: 17.1 % — ABNORMAL HIGH (ref 11.5–14.6)
WBC: 11.1 10*3/uL — ABNORMAL HIGH (ref 4.5–10.5)

## 2011-02-05 LAB — HEPATIC FUNCTION PANEL
ALT: 33 U/L (ref 0–35)
AST: 36 U/L (ref 0–37)
Albumin: 4 g/dL (ref 3.5–5.2)
Alkaline Phosphatase: 78 U/L (ref 39–117)
Bilirubin, Direct: 0.1 mg/dL (ref 0.0–0.3)
Total Bilirubin: 0.6 mg/dL (ref 0.3–1.2)
Total Protein: 7 g/dL (ref 6.0–8.3)

## 2011-02-05 LAB — LIPID PANEL
Cholesterol: 121 mg/dL (ref 0–200)
HDL: 37.9 mg/dL — ABNORMAL LOW (ref >39.00)
Total CHOL/HDL Ratio: 3
Triglycerides: 219 mg/dL — ABNORMAL HIGH (ref 0.0–149.0)
VLDL: 43.8 mg/dL — ABNORMAL HIGH (ref 0.0–40.0)

## 2011-02-05 LAB — CONVERTED CEMR LAB
HCV Ab: NEGATIVE
Tissue Transglutaminase Ab, IgA: 3.1 units (ref <20)

## 2011-02-05 LAB — TSH: TSH: 6.06 u[IU]/mL — ABNORMAL HIGH (ref 0.35–5.50)

## 2011-02-05 LAB — B12 AND FOLATE PANEL
Folate: 16.1 ng/mL (ref >5.9)
Vitamin B-12: 303 pg/mL (ref 211–911)

## 2011-02-05 LAB — IBC PANEL
Iron: 47 ug/dL (ref 42–145)
Saturation Ratios: 9.6 % — ABNORMAL LOW (ref 20.0–50.0)
Transferrin: 349.7 mg/dL (ref 212.0–360.0)

## 2011-02-05 LAB — CARDIAC PANEL
CK-MB: 1 ng/mL (ref 0.3–4.0)
Relative Index: 1.2 calc (ref 0.0–2.5)
Total CK: 81 U/L (ref 7–177)

## 2011-02-05 LAB — BASIC METABOLIC PANEL
BUN: 11 mg/dL (ref 6–23)
CO2: 25 mEq/L (ref 19–32)
Calcium: 9.4 mg/dL (ref 8.4–10.5)
Chloride: 106 mEq/L (ref 96–112)
Creatinine, Ser: 0.8 mg/dL (ref 0.4–1.2)
GFR: 100.4 mL/min (ref >60.00)
Glucose, Bld: 122 mg/dL — ABNORMAL HIGH (ref 70–99)
Potassium: 4.1 mEq/L (ref 3.5–5.1)
Sodium: 140 mEq/L (ref 135–145)

## 2011-02-05 LAB — HEMOGLOBIN A1C: Hgb A1c MFr Bld: 6.4 % (ref 4.6–6.5)

## 2011-02-05 LAB — BRAIN NATRIURETIC PEPTIDE: Pro B Natriuretic peptide (BNP): 14.5 pg/mL (ref 0.0–100.0)

## 2011-02-05 LAB — LDL CHOLESTEROL, DIRECT: Direct LDL: 54.1 mg/dL

## 2011-02-06 ENCOUNTER — Other Ambulatory Visit: Payer: Self-pay | Admitting: Internal Medicine

## 2011-02-06 DIAGNOSIS — R7401 Elevation of levels of liver transaminase levels: Secondary | ICD-10-CM

## 2011-02-06 DIAGNOSIS — R7402 Elevation of levels of lactic acid dehydrogenase (LDH): Secondary | ICD-10-CM

## 2011-02-07 ENCOUNTER — Emergency Department (HOSPITAL_COMMUNITY)
Admission: EM | Admit: 2011-02-07 | Discharge: 2011-02-07 | Disposition: A | Discharge status: Home / Self Care | Payer: Medicare PPO | Attending: Emergency Medicine | Admitting: Emergency Medicine

## 2011-02-07 ENCOUNTER — Emergency Department (HOSPITAL_COMMUNITY): Payer: Medicare PPO

## 2011-02-07 DIAGNOSIS — J45909 Unspecified asthma, uncomplicated: Secondary | ICD-10-CM | POA: Insufficient documentation

## 2011-02-07 DIAGNOSIS — R209 Unspecified disturbances of skin sensation: Secondary | ICD-10-CM | POA: Insufficient documentation

## 2011-02-07 DIAGNOSIS — R42 Dizziness and giddiness: Secondary | ICD-10-CM | POA: Insufficient documentation

## 2011-02-07 DIAGNOSIS — K219 Gastro-esophageal reflux disease without esophagitis: Secondary | ICD-10-CM | POA: Insufficient documentation

## 2011-02-07 DIAGNOSIS — R0789 Other chest pain: Secondary | ICD-10-CM | POA: Insufficient documentation

## 2011-02-07 DIAGNOSIS — M65839 Other synovitis and tenosynovitis, unspecified forearm: Secondary | ICD-10-CM | POA: Insufficient documentation

## 2011-02-07 DIAGNOSIS — R5383 Other fatigue: Secondary | ICD-10-CM | POA: Insufficient documentation

## 2011-02-07 DIAGNOSIS — R5381 Other malaise: Secondary | ICD-10-CM | POA: Insufficient documentation

## 2011-02-07 DIAGNOSIS — M542 Cervicalgia: Secondary | ICD-10-CM | POA: Insufficient documentation

## 2011-02-07 DIAGNOSIS — Z79899 Other long term (current) drug therapy: Secondary | ICD-10-CM | POA: Insufficient documentation

## 2011-02-07 DIAGNOSIS — M79609 Pain in unspecified limb: Secondary | ICD-10-CM | POA: Insufficient documentation

## 2011-02-07 DIAGNOSIS — R0602 Shortness of breath: Secondary | ICD-10-CM | POA: Insufficient documentation

## 2011-02-07 DIAGNOSIS — E119 Type 2 diabetes mellitus without complications: Secondary | ICD-10-CM | POA: Insufficient documentation

## 2011-02-07 DIAGNOSIS — M25559 Pain in unspecified hip: Secondary | ICD-10-CM | POA: Insufficient documentation

## 2011-02-07 DIAGNOSIS — M25539 Pain in unspecified wrist: Secondary | ICD-10-CM | POA: Insufficient documentation

## 2011-02-07 DIAGNOSIS — F341 Dysthymic disorder: Secondary | ICD-10-CM | POA: Insufficient documentation

## 2011-02-07 DIAGNOSIS — I10 Essential (primary) hypertension: Secondary | ICD-10-CM | POA: Insufficient documentation

## 2011-02-07 DIAGNOSIS — R110 Nausea: Secondary | ICD-10-CM | POA: Insufficient documentation

## 2011-02-07 DIAGNOSIS — M25519 Pain in unspecified shoulder: Secondary | ICD-10-CM | POA: Insufficient documentation

## 2011-02-07 LAB — DIFFERENTIAL
Basophils Absolute: 0 10*3/uL (ref 0.0–0.1)
Basophils Relative: 0 % (ref 0–1)
Eosinophils Absolute: 0.2 10*3/uL (ref 0.0–0.7)
Eosinophils Relative: 2 % (ref 0–5)
Lymphocytes Relative: 31 % (ref 12–46)
Lymphs Abs: 3.2 10*3/uL (ref 0.7–4.0)
Monocytes Absolute: 0.9 10*3/uL (ref 0.1–1.0)
Monocytes Relative: 8 % (ref 3–12)
Neutro Abs: 6.1 10*3/uL (ref 1.7–7.7)
Neutrophils Relative %: 59 % (ref 43–77)

## 2011-02-07 LAB — RAPID URINE DRUG SCREEN, HOSP PERFORMED
Amphetamines: NOT DETECTED
Barbiturates: NOT DETECTED
Benzodiazepines: POSITIVE — AB
Cocaine: NOT DETECTED
Opiates: NOT DETECTED
Tetrahydrocannabinol: NOT DETECTED

## 2011-02-07 LAB — CBC
HCT: 29.4 % — ABNORMAL LOW (ref 36.0–46.0)
Hemoglobin: 9.1 g/dL — ABNORMAL LOW (ref 12.0–15.0)
MCH: 25.6 pg — ABNORMAL LOW (ref 26.0–34.0)
MCHC: 31 g/dL (ref 30.0–36.0)
MCV: 82.8 fL (ref 78.0–100.0)
Platelets: 357 10*3/uL (ref 150–400)
RBC: 3.55 MIL/uL — ABNORMAL LOW (ref 3.87–5.11)
RDW: 15.5 % (ref 11.5–15.5)
WBC: 10.3 10*3/uL (ref 4.0–10.5)

## 2011-02-07 LAB — BASIC METABOLIC PANEL
BUN: 7 mg/dL (ref 6–23)
CO2: 26 mEq/L (ref 19–32)
Calcium: 9.1 mg/dL (ref 8.4–10.5)
Chloride: 106 mEq/L (ref 96–112)
Creatinine, Ser: 0.9 mg/dL (ref 0.4–1.2)
GFR calc Af Amer: >60 mL/min (ref >60)
GFR calc non Af Amer: >60 mL/min (ref >60)
Glucose, Bld: 180 mg/dL — ABNORMAL HIGH (ref 70–99)
Potassium: 3.9 mEq/L (ref 3.5–5.1)
Sodium: 141 mEq/L (ref 135–145)

## 2011-02-07 LAB — URINALYSIS, ROUTINE W REFLEX MICROSCOPIC
Bilirubin Urine: NEGATIVE
Hgb urine dipstick: NEGATIVE
Ketones, ur: NEGATIVE mg/dL
Nitrite: NEGATIVE
Protein, ur: NEGATIVE mg/dL
Specific Gravity, Urine: 1.013 (ref 1.005–1.030)
Urine Glucose, Fasting: NEGATIVE mg/dL
Urobilinogen, UA: 0.2 mg/dL (ref 0.0–1.0)
pH: 6 (ref 5.0–8.0)

## 2011-02-07 LAB — POCT CARDIAC MARKERS
CKMB, poc: <1 ng/mL — ABNORMAL LOW (ref 1.0–8.0)
CKMB, poc: 1.5 ng/mL (ref 1.0–8.0)
Myoglobin, poc: 54.8 ng/mL (ref 12–200)
Myoglobin, poc: 62.8 ng/mL (ref 12–200)
Troponin i, poc: <0.05 ng/mL (ref 0.00–0.09)
Troponin i, poc: <0.05 ng/mL (ref 0.00–0.09)

## 2011-02-07 LAB — GLUCOSE, CAPILLARY: Glucose-Capillary: 120 mg/dL — ABNORMAL HIGH (ref 70–99)

## 2011-02-10 NOTE — Letter (Signed)
Summary: Lipid Letter  [facility]  [address] [place], [place] [address]   Phone: [phone]  Fax: [phone]    02/05/2011  Valerie [name] [address] [place], [place]  [address]  Dear Ms. Limehouse:  We have carefully reviewed your last lipid profile from  and the results are noted below with a summary of recommendations for lipid management.    Cholesterol:       121     Goal: <200   HDL "good" Cholesterol:   27.06     Goal: >40   LDL "bad" Cholesterol:   54     Goal: <100   Triglycerides:       219.0     Goal: <150 too high!        TLC Diet (Therapeutic Lifestyle Change): Saturated Fats & Transfatty acids should be kept < 7% of total calories ***Reduce Saturated Fats Polyunstaurated Fat can be up to 10% of total calories Monounsaturated Fat Fat can be up to 20% of total calories Total Fat should be no greater than 25-35% of total calories Carbohydrates should be 50-60% of total calories Protein should be approximately 15% of total calories Fiber should be at least 20-30 grams a day ***Increased fiber may help lower LDL Total Cholesterol should be < 200mg /day Consider adding plant stanol/sterols to diet (example: Benacol spread) ***A higher intake of unsaturated fat may reduce Triglycerides and Increase HDL    Adjunctive Measures (may lower LIPIDS and reduce risk of Heart Attack) include: Aerobic Exercise (20-30 minutes 3-4 times a week) Limit Alcohol Consumption Weight Reduction Aspirin 75-81 mg a day by mouth (if not allergic or contraindicated) Dietary Fiber 20-30 grams a day by mouth     Current Medications: 1)    Coreg 25 Mg Tabs (Carvedilol) .[name].. 1 by mouth two times a day 2)    Crestor 10 Mg Tabs (Rosuvastatin calcium) .... Take 1 tablet by mouth once a day 3)    Xanax 2 Mg Tabs (Alprazolam) .... Take 1 tablet by mouth three times a day 4)    Lamictal 200 Mg Tabs (Lamotrigine) .... Take 1 tablet by mouth once a day 5)    Singulair 10 Mg  Tabs (Montelukast sodium) .... Take 1 tablet by mouth once a day 6)    Kapidex 60 Mg Cpdr (Dexlansoprazole) .... Hold 7)    Exforge 10-320 Mg Tabs (Amlodipine besylate-valsartan) .... Take 1 tablet by mouth once a day 8)    Bayer Contour Monitor W/device Kit (Blood glucose monitoring suppl) .... Use bid 9)    Bayer Contour Test  Strp (Glucose blood) .... Use bid 10)    Ventolin Hfa 108 (90 Base) Mcg/act Aers (Albuterol sulfate) .... Use as directed 11)    Actos 30 Mg Tabs (Pioglitazone hcl) .... One by mouth once daily for diabetes 12)    Feosol 200 (65 Fe) Mg Tabs (Ferrous sulfate dried) .... One by mouth two times a day with food 13)    Nortryptilline  .[name].. 3 by mouth at bedtime 14)    Nitrostat 0.4 Mg Subl (Nitroglycerin) .... One every 5 mins under tongue for chest pain up to 3 times.  if not resolved call 911 15)    Isosorbide Mononitrate Cr 60 Mg Xr24h-tab (Isosorbide mononitrate) .... 1/2 daily  If you have any questions, please call. We appreciate being able to work with you.   Sincerely,    [facility] Etta Grandchild MD

## 2011-02-10 NOTE — Letter (Signed)
Summary: Results Follow-up Letter  [facility]  [address] [place], [place] [address]   Phone: [phone]  Fax: [phone]    02/05/2011  [address] [place], [place]  [address]  [place]  Dear Valerie [name],   The following are the results of your recent test(s):  Test     Result     Thyroid     slightly underactive CBC       anemia, high WBC Liver/kidney   normal Blood sugar     good average Urine       normal   _________________________________________________________  Please call for an appointment soon _________________________________________________________ _________________________________________________________ _________________________________________________________  Sincerely,  Sanda Linger MD [facility]

## 2011-02-10 NOTE — Assessment & Plan Note (Signed)
Summary: pain in left thumb radiating down to hand/numbness in left ar...   Vital Signs:  Patient profile:   65 year old female Menstrual status:  postmenopausal Height:      66 inches Weight:      269.50 pounds BMI:     43.66 O2 Sat:      99 % on Room air Temp:     98.5 degrees F oral Pulse rate:   93 / minute Pulse rhythm:   regular Resp:     20 per minute BP sitting:   158 / 82  (left arm) Cuff size:   large  Vitals Entered By: Burnard Leigh CMA(AAMA) (February 05, 2011 8:28 AM)  O2 Flow:  Room air CC: Pt here for F/U on Diabetes, Hypertension & Nutrition mgnt.Pt c/o [ain in leg arm w/chest tightness.Pt c/o of fatigue & weakness.Pt c/o diarrhea w/mucus in stool/sls,cma Is Patient Diabetic? Yes Comments Pt states she is not taking Feosol, Nortryptilline, Kapidex, Meclizine, and Sulfamethoxazole-TMP   Primary Care Provider:  Etta Grandchild MD  CC:  Pt here for F/U on Diabetes, Hypertension & Nutrition mgnt.Pt c/o [ain in leg arm w/chest tightness.Pt c/o of fatigue & weakness.Pt c/o diarrhea w/mucus in stool/sls, and cma.  History of Present Illness:       This is a 65 year old female who presents with Chest pain.  The symptoms began 1 week ago.  On a scale of 1 to 10, the intensity is described as a 1.  The patient reports resting chest pain, but denies exertional chest pain, nausea, vomiting, diaphoresis, shortness of breath, palpitations, dizziness, light headedness, syncope, and indigestion.  The pain is described as constant and pressure-like.  The pain is located in the substernal area and the pain does not radiate.  The pain radiates to the substernal area.  The pain is brought on or made worse by emotional stress.    Follow-Up Visit      The patient also presents for Follow-up visit.  The patient complains of edema, but denies palpitations, dizziness, syncope, low blood sugar symptoms, high blood sugar symptoms, SOB, DOE, PND, and orthopnea.  Since the last visit the  patient notes problems with medications.  The patient reports taking meds as prescribed, monitoring BP, monitoring blood sugars, and dietary noncompliance.  When questioned about possible medication side effects, the patient notes GI upset.    Also, she has left wrist pain that radiates up and down her arm.  Preventive Screening-Counseling & Management  Alcohol-Tobacco     Alcohol drinks/day: 0     Alcohol Counseling: not indicated; patient does not drink     Smoking Status: never     Tobacco Counseling: not indicated; no tobacco use  Hep-HIV-STD-Contraception     Hepatitis Risk: no risk noted     HIV Risk: no risk noted     STD Risk: no risk noted      Sexual History:  currently monogamous.        Drug Use:  no.        Blood Transfusions:  no.    Clinical Review Panels:  Prevention   Last Mammogram:  Normal Bilateral (11/18/2010)   Last Pap Smear:  Normal (05/22/2003)   Last Colonoscopy:  Normal (02/23/2007)  Immunizations   Last Tetanus Booster:  Tdap (02/05/2010)   Last Flu Vaccine:  Fluvax 3+ (09/04/2010)  Lipid Management   Cholesterol:  172 (07/09/2010)   HDL (good cholesterol):  43.10 (07/09/2010)  Diabetes Management  HgBA1C:  6.7 (10/17/2010)   Creatinine:  0.7 (10/17/2010)   Last Dilated Eye Exam:  normal (02/20/2009)   Last Foot Exam:  yes (02/05/2011)   Last Flu Vaccine:  Fluvax 3+ (09/04/2010)  CBC   WBC:  11.4 (10/17/2010)   RBC:  3.71 (10/17/2010)   Hgb:  10.6 (10/17/2010)   Hct:  31.6 (10/17/2010)   Platelets:  354.0 (10/17/2010)   MCV  85.4 (10/17/2010)   MCHC  33.5 (10/17/2010)   RDW  17.7 (10/17/2010)   PMN:  63.4 (10/17/2010)   Lymphs:  25.1 (10/17/2010)   Monos:  9.4 (10/17/2010)   Eosinophils:  1.5 (10/17/2010)   Basophil:  0.6 (10/17/2010)  Complete Metabolic Panel   Glucose:  102 (10/17/2010)   Sodium:  143 (10/17/2010)   Potassium:  4.1 (10/17/2010)   Chloride:  105 (10/17/2010)   CO2:  29 (10/17/2010)   BUN:  10  (10/17/2010)   Creatinine:  0.7 (10/17/2010)   Albumin:  4.1 (10/17/2010)   Total Protein:  7.1 (10/17/2010)   Calcium:  9.6 (10/17/2010)   Total Bili:  0.3 (10/17/2010)   Alk Phos:  82 (10/17/2010)   SGPT (ALT):  28 (10/17/2010)   SGOT (AST):  40 (10/17/2010)   Medications Prior to Update: 1)  Coreg 25 Mg Tabs (Carvedilol) .[name].. 1 By Mouth Two Times A Day 2)  Crestor 10 Mg Tabs (Rosuvastatin Calcium) .... Take 1 Tablet By Mouth Once A Day 3)  Xanax 2 Mg Tabs (Alprazolam) .... Take 1 Tablet By Mouth Three Times A Day 4)  Lamictal 200 Mg Tabs (Lamotrigine) .... Take 1 Tablet By Mouth Once A Day 5)  Singulair 10 Mg Tabs (Montelukast Sodium) .... Take 1 Tablet By Mouth Once A Day 6)  Kapidex 60 Mg Cpdr (Dexlansoprazole) .... Hold 7)  Exforge 10-320 Mg Tabs (Amlodipine Besylate-Valsartan) .... Take 1 Tablet By Mouth Once A Day 8)  Bayer Contour Monitor W/device Kit (Blood Glucose Monitoring Suppl) .... Use Bid 9)  Bayer Contour Test  Strp (Glucose Blood) .... Use Bid 10)  Ventolin Hfa 108 (90 Base) Mcg/act Aers (Albuterol Sulfate) .... Use As Directed 11)  Actos 30 Mg Tabs (Pioglitazone Hcl) .... One By Mouth Once Daily For Diabetes 12)  Metformin Hcl 850 Mg Tabs (Metformin Hcl) .... One By Mouth Two Times A Day For Diabetes 13)  Feosol 200 (65 Fe) Mg Tabs (Ferrous Sulfate Dried) .... One By Mouth Two Times A Day With Food 14)  Nortryptilline .[name].. 3 By Mouth At Bedtime 15)  Nitrostat 0.4 Mg Subl (Nitroglycerin) .... One Every 5 Mins Under Tongue For Chest Pain Up To 3 Times.  If Not Resolved Call 911 16)  Isosorbide Mononitrate Cr 60 Mg Xr24h-Tab (Isosorbide Mononitrate) .... 1/2 Daily 17)  Meclizine Hcl 25 Mg Tabs (Meclizine Hcl) .... Take 1 Tablet By Mouth Three Times A Day 18)  Sulfamethoxazole-Tmp Ds 800-160 Mg Tabs (Sulfamethoxazole-Trimethoprim) .... Take 1 Tablet By Mouth Two Times A Day X 5 Days  Current Medications (verified): 1)  Coreg 25 Mg Tabs (Carvedilol) .[name].. 1 By Mouth Two  Times A Day 2)  Crestor 10 Mg Tabs (Rosuvastatin Calcium) .... Take 1 Tablet By Mouth Once A Day 3)  Xanax 2 Mg Tabs (Alprazolam) .... Take 1 Tablet By Mouth Three Times A Day 4)  Lamictal 200 Mg Tabs (Lamotrigine) .... Take 1 Tablet By Mouth Once A Day 5)  Singulair 10 Mg Tabs (Montelukast Sodium) .... Take 1 Tablet By Mouth Once A Day 6)  Kapidex  60 Mg Cpdr (Dexlansoprazole) .... Hold 7)  Exforge 10-320 Mg Tabs (Amlodipine Besylate-Valsartan) .... Take 1 Tablet By Mouth Once A Day 8)  Bayer Contour Monitor W/device Kit (Blood Glucose Monitoring Suppl) .... Use Bid 9)  Bayer Contour Test  Strp (Glucose Blood) .... Use Bid 10)  Ventolin Hfa 108 (90 Base) Mcg/act Aers (Albuterol Sulfate) .... Use As Directed 11)  Actos 30 Mg Tabs (Pioglitazone Hcl) .... One By Mouth Once Daily For Diabetes 12)  Feosol 200 (65 Fe) Mg Tabs (Ferrous Sulfate Dried) .... One By Mouth Two Times A Day With Food 13)  Nortryptilline .[name].. 3 By Mouth At Bedtime 14)  Nitrostat 0.4 Mg Subl (Nitroglycerin) .... One Every 5 Mins Under Tongue For Chest Pain Up To 3 Times.  If Not Resolved Call 911 15)  Isosorbide Mononitrate Cr 60 Mg Xr24h-Tab (Isosorbide Mononitrate) .... 1/2 Daily  Allergies (verified): 1)  ! Penicillin  Past History:  Past Medical History: Last updated: 06/24/2010 Anemia-NOS Depression Diabetes mellitus, type II GERD Hyperlipidemia Hypertension Sleep apnea (CPAP) Asthma Hemorrhoids  Past Surgical History: Last updated: 06/24/2010 Benign tumors resected Tubal ligation  Family History: Last updated: 06/24/2010 Family History of Alcoholism/Addiction Family History Hypertension Family History of Cardiovascular disorder (A brother had coronary artery disease dying at age 29. Her mother died at 76 with alcohol abuse and a myocardial infarction)  Social History: Last updated: 05/06/2009 Married Never Smoked Alcohol use-no Drug use-no Regular exercise-no Disabled  Risk  Factors: Alcohol Use: 0 (02/05/2011) Exercise: no (05/06/2009)  Risk Factors: Smoking Status: never (02/05/2011)  Family History: Reviewed history from 06/24/2010 and no changes required. Family History of Alcoholism/Addiction Family History Hypertension Family History of Cardiovascular disorder (A brother had coronary artery disease dying at age 50. Her mother died at 48 with alcohol abuse and a myocardial infarction)  Social History: Reviewed history from 05/06/2009 and no changes required. Married Never Smoked Alcohol use-no Drug use-no Regular exercise-no Disabled  Review of Systems       The patient complains of weight gain and peripheral edema.  The patient denies anorexia, fever, weight loss, syncope, dyspnea on exertion, prolonged cough, headaches, hemoptysis, abdominal pain, melena, hematochezia, severe indigestion/heartburn, hematuria, suspicious skin lesions, difficulty walking, depression, enlarged lymph nodes, and angioedema.   General:  Complains of fatigue and malaise; denies chills, fever, loss of appetite, sleep disorder, sweats, weakness, and weight loss. GI:  Complains of change in bowel habits, diarrhea, gas, and nausea; denies abdominal pain, bloody stools, constipation, excessive appetite, hemorrhoids, indigestion, loss of appetite, vomiting, vomiting blood, and yellowish skin color. MS:  Complains of joint pain, muscle aches, and stiffness; denies joint redness, joint swelling, loss of strength, low back pain, and mid back pain. Endo:  Denies cold intolerance, excessive hunger, excessive thirst, excessive urination, heat intolerance, polyuria, and weight change.  Physical Exam  General:  alert, well-developed, well-nourished, well-hydrated, cooperative to examination, good hygiene, and overweight-appearing.   Head:  normocephalic, atraumatic, no abnormalities observed, and no abnormalities palpated.   Eyes:  no icterus, pink moist mm. Ears:  R ear normal and  L ear normal.   Mouth:  Oral mucosa and oropharynx without lesions or exudates.  Teeth in good repair. Neck:  supple, full ROM, no masses, no carotid bruits, no cervical lymphadenopathy, and no neck tenderness.   Lungs:  Normal respiratory effort, chest expands symmetrically. Lungs are clear to auscultation, no crackles or wheezes. Heart:  Normal rate and regular rhythm. S1 and S2 normal without gallop, murmur, click, rub  or other extra sounds. Abdomen:  soft, non-tender, normal bowel sounds, no distention, no masses, no guarding, no hepatomegaly, and no splenomegaly.   Msk:  normal ROM, no joint tenderness, no joint swelling, no joint warmth, no redness over joints, no joint deformities, no joint instability, and no crepitation.   Pulses:  R and L carotid,radial,femoral,dorsalis pedis and posterior tibial pulses are full and equal bilaterally Extremities:  trace left pedal edema and trace right pedal edema.   Neurologic:  No cranial nerve deficits noted. Station and gait are normal. Plantar reflexes are down-going bilaterally. DTRs are symmetrical throughout. Sensory, motor and coordinative functions appear intact. Skin:  turgor normal, color normal, no rashes, no suspicious lesions, no ecchymoses, no petechiae, no purpura, no ulcerations, and no edema.   Cervical Nodes:  no anterior cervical adenopathy and no posterior cervical adenopathy.   Axillary Nodes:  no R axillary adenopathy and no L axillary adenopathy.   Psych:  Oriented X3, memory intact for recent and remote, normally interactive, good eye contact, not anxious appearing, not depressed appearing, not agitated, not suicidal, and not homicidal.    Diabetes Management Exam:    Foot Exam (with socks and/or shoes not present):       Sensory-Pinprick/Light touch:          Left medial foot (L-4): normal          Left dorsal foot (L-5): normal          Left lateral foot (S-1): normal          Right medial foot (L-4): normal          Right  dorsal foot (L-5): normal          Right lateral foot (S-1): normal       Sensory-Monofilament:          Left foot: normal          Right foot: normal       Inspection:          Left foot: normal          Right foot: normal       Nails:          Left foot: normal          Right foot: normal   Impression & Recommendations:  Problem # 1:  DIABETES MELLITUS, TYPE II (ICD-250.00) Assessment Deteriorated  will stop metformin due to diarrhea The following medications were removed from the medication list:    Metformin Hcl 850 Mg Tabs (Metformin hcl) ..... One by mouth two times a day for diabetes Her updated medication list for this problem includes:    Exforge 10-320 Mg Tabs (Amlodipine besylate-valsartan) .[name]... Take 1 tablet by mouth once a day    Actos 30 Mg Tabs (Pioglitazone hcl) ..... One by mouth once daily for diabetes  Orders: Venipuncture (47829) TLB-B12 + Folate Pnl (56213_08657-Q46/NGE) TLB-IBC Pnl (Iron/FE;Transferrin) (83550-IBC) TLB-Lipid Panel (80061-LIPID) TLB-BMP (Basic Metabolic Panel-BMET) (80048-METABOL) TLB-CBC Platelet - w/Differential (85025-CBCD) TLB-Hepatic/Liver Function Pnl (80076-HEPATIC) TLB-TSH (Thyroid Stimulating Hormone) (84443-TSH) TLB-Cardiac Panel (95284_13244-WNUU) TLB-BNP (B-Natriuretic Peptide) (83880-BNPR) TLB-A1C / Hgb A1C (Glycohemoglobin) (83036-A1C) TLB-Udip w/ Micro (81001-URINE) Nutrition Referral (Nutrition) Diabetic Clinic Referral (Diabetic)  Labs Reviewed: Creat: 0.7 (10/17/2010)     Last Eye Exam: normal (02/20/2009) Reviewed HgBA1c results: 6.7 (10/17/2010)  5.7 (05/07/2010)  Problem # 2:  TRANSAMINASES, SERUM, ELEVATED (ICD-790.4) Assessment: New  Orders: Venipuncture (72536) TLB-B12 + Folate Pnl (64403_47425-Z56/LOV) TLB-IBC Pnl (Iron/FE;Transferrin) (83550-IBC) TLB-Lipid Panel (80061-LIPID) TLB-BMP (Basic Metabolic  Panel-BMET) (80048-METABOL) TLB-CBC Platelet - w/Differential (85025-CBCD) TLB-Hepatic/Liver  Function Pnl (80076-HEPATIC) TLB-TSH (Thyroid Stimulating Hormone) (84443-TSH) TLB-Cardiac Panel (29528_41324-MWNU) TLB-BNP (B-Natriuretic Peptide) (83880-BNPR) TLB-A1C / Hgb A1C (Glycohemoglobin) (83036-A1C) TLB-Udip w/ Micro (81001-URINE) T-Hepatitis C Anti HCV (27253) T-Sprue Panel (Celiac Disease Aby Eval) (83516x3/[address]) T-Tissue Transglutamase Ab IgA (66440-34742) Radiology Referral (Radiology)  Problem # 3:  EDEMA- LOCALIZED (ICD-782.3) Assessment: New  Orders: Venipuncture (59563) TLB-B12 + Folate Pnl (87564_33295-J88/CZY) TLB-IBC Pnl (Iron/FE;Transferrin) (83550-IBC) TLB-Lipid Panel (80061-LIPID) TLB-BMP (Basic Metabolic Panel-BMET) (80048-METABOL) TLB-CBC Platelet - w/Differential (85025-CBCD) TLB-Hepatic/Liver Function Pnl (80076-HEPATIC) TLB-TSH (Thyroid Stimulating Hormone) (84443-TSH) TLB-Cardiac Panel (60630_16010-XNAT) TLB-BNP (B-Natriuretic Peptide) (83880-BNPR) TLB-A1C / Hgb A1C (Glycohemoglobin) (83036-A1C) TLB-Udip w/ Micro (81001-URINE) EKG w/ Interpretation (93000)  Problem # 4:  HYPERTENSION (ICD-401.9) Assessment: Deteriorated  Her updated medication list for this problem includes:    Coreg 25 Mg Tabs (Carvedilol) .[name]... 1 by mouth two times a day    Exforge 10-320 Mg Tabs (Amlodipine besylate-valsartan) .[name]... Take 1 tablet by mouth once a day  Orders: Venipuncture (55732) TLB-B12 + Folate Pnl (20254_27062-B76/EGB) TLB-IBC Pnl (Iron/FE;Transferrin) (83550-IBC) TLB-Lipid Panel (80061-LIPID) TLB-BMP (Basic Metabolic Panel-BMET) (80048-METABOL) TLB-CBC Platelet - w/Differential (85025-CBCD) TLB-Hepatic/Liver Function Pnl (80076-HEPATIC) TLB-TSH (Thyroid Stimulating Hormone) (84443-TSH) TLB-Cardiac Panel (15176_16073-XTGG) TLB-BNP (B-Natriuretic Peptide) (83880-BNPR) TLB-A1C / Hgb A1C (Glycohemoglobin) (83036-A1C) TLB-Udip w/ Micro (81001-URINE)  BP today: 158/82 Prior BP: 122/70 (10/17/2010)  Prior 10 Yr Risk Heart Disease: Not enough  information (05/06/2009)  Labs Reviewed: K+: 4.1 (10/17/2010) Creat: : 0.7 (10/17/2010)   Chol: 172 (07/09/2010)   HDL: 43.10 (07/09/2010)   TG: 435.0 (07/09/2010)  Problem # 5:  CHEST PAIN (ICD-786.50) Assessment: New Her EKG is normal and the pain does not sound cardiac, will check CPK-MB and BNP in light of the edema to look further for ischemia and CHF Orders: Venipuncture (26948) TLB-B12 + Folate Pnl (54627_03500-X38/HWE) TLB-IBC Pnl (Iron/FE;Transferrin) (83550-IBC) TLB-Lipid Panel (80061-LIPID) TLB-BMP (Basic Metabolic Panel-BMET) (80048-METABOL) TLB-CBC Platelet - w/Differential (85025-CBCD) TLB-Hepatic/Liver Function Pnl (80076-HEPATIC) TLB-TSH (Thyroid Stimulating Hormone) (84443-TSH) TLB-Cardiac Panel (99371_69678-LFYB) TLB-BNP (B-Natriuretic Peptide) (83880-BNPR) TLB-A1C / Hgb A1C (Glycohemoglobin) (83036-A1C) TLB-Udip w/ Micro (81001-URINE) EKG w/ Interpretation (93000)  Problem # 6:  ANEMIA-NOS (ICD-285.9) Assessment: Unchanged  Orders: Venipuncture (01751) TLB-B12 + Folate Pnl (02585_27782-U23/NTI) TLB-IBC Pnl (Iron/FE;Transferrin) (83550-IBC) TLB-Lipid Panel (80061-LIPID) TLB-BMP (Basic Metabolic Panel-BMET) (80048-METABOL) TLB-CBC Platelet - w/Differential (85025-CBCD) TLB-Hepatic/Liver Function Pnl (80076-HEPATIC) TLB-TSH (Thyroid Stimulating Hormone) (84443-TSH) TLB-Cardiac Panel (14431_54008-QPYP) TLB-BNP (B-Natriuretic Peptide) (83880-BNPR) TLB-A1C / Hgb A1C (Glycohemoglobin) (83036-A1C) TLB-Udip w/ Micro (81001-URINE) T-Hepatitis C Anti HCV (95093) T-Sprue Panel (Celiac Disease Aby Eval) (83516x3/[address]) T-Tissue Transglutamase Ab IgA (26712-45809) Radiology Referral (Radiology)  Her updated medication list for this problem includes:    Feosol 200 (65 Fe) Mg Tabs (Ferrous sulfate dried) ..... One by mouth two times a day with food  Hgb: 10.6 (10/17/2010)   Hct: 31.6 (10/17/2010)   Platelets: 354.0 (10/17/2010) RBC: 3.71 (10/17/2010)   RDW:  17.7 (10/17/2010)   WBC: 11.4 (10/17/2010) MCV: 85.4 (10/17/2010)   MCHC: 33.5 (10/17/2010) Iron: 87 (05/07/2010)   % Sat: 17.7 (05/07/2010) B12: 378 (02/05/2010)   Folate: 11.5 (02/05/2010)   TSH: 2.11 (10/17/2010)  Problem # 7:  CARPAL TUNNEL SYNDROME, LEFT (ICD-354.0) Assessment: New  Orders: Neurology Referral (Neuro)  Labs Reviewed: TSH: 2.11 (10/17/2010)   HgBA1c: 6.7 (10/17/2010)  Complete Medication List: 1)  Coreg 25 Mg Tabs (Carvedilol) .[name].. 1 by mouth two times a day 2)  Crestor 10 Mg Tabs (Rosuvastatin calcium) .... Take 1 tablet by mouth once a day 3)  Xanax 2  Mg Tabs (Alprazolam) .... Take 1 tablet by mouth three times a day 4)  Lamictal 200 Mg Tabs (Lamotrigine) .... Take 1 tablet by mouth once a day 5)  Singulair 10 Mg Tabs (Montelukast sodium) .... Take 1 tablet by mouth once a day 6)  Kapidex 60 Mg Cpdr (Dexlansoprazole) .... Hold 7)  Exforge 10-320 Mg Tabs (Amlodipine besylate-valsartan) .... Take 1 tablet by mouth once a day 8)  Bayer Contour Monitor W/device Kit (Blood glucose monitoring suppl) .... Use bid 9)  Bayer Contour Test Strp (Glucose blood) .... Use bid 10)  Ventolin Hfa 108 (90 Base) Mcg/act Aers (Albuterol sulfate) .... Use as directed 11)  Actos 30 Mg Tabs (Pioglitazone hcl) .... One by mouth once daily for diabetes 12)  Feosol 200 (65 Fe) Mg Tabs (Ferrous sulfate dried) .... One by mouth two times a day with food 13)  Nortryptilline  .[name].. 3 by mouth at bedtime 14)  Nitrostat 0.4 Mg Subl (Nitroglycerin) .... One every 5 mins under tongue for chest pain up to 3 times.  if not resolved call 911 15)  Isosorbide Mononitrate Cr 60 Mg Xr24h-tab (Isosorbide mononitrate) .... 1/2 daily  Patient Instructions: 1)  Please schedule a follow-up appointment in 2 weeks. 2)  It is important that you exercise regularly at least 20 minutes 5 times a week. If you develop chest pain, have severe difficulty breathing, or feel very tired , stop exercising immediately and  seek medical attention. 3)  You need to lose weight. Consider a lower calorie diet and regular exercise.  4)  Check your blood sugars regularly. If your readings are usually above 200 or below 70 you should contact our office. 5)  It is important that your Diabetic A1c level is checked every 3 months. 6)  See your eye doctor yearly to check for diabetic eye damage. 7)  Check your feet each night for sore areas, calluses or signs of infection. 8)  Check your Blood Pressure regularly. If it is above 130/80: you should make an appointment.   Orders Added: 1)  Venipuncture [36415] 2)  TLB-B12 + Folate Pnl [82746_82607-B12/FOL] 3)  TLB-IBC Pnl (Iron/FE;Transferrin) [83550-IBC] 4)  TLB-Lipid Panel [80061-LIPID] 5)  TLB-BMP (Basic Metabolic Panel-BMET) [80048-METABOL] 6)  TLB-CBC Platelet - w/Differential [85025-CBCD] 7)  TLB-Hepatic/Liver Function Pnl [80076-HEPATIC] 8)  TLB-TSH (Thyroid Stimulating Hormone) [84443-TSH] 9)  TLB-Cardiac Panel [82550_82553-CARD] 10)  TLB-BNP (B-Natriuretic Peptide) [83880-BNPR] 11)  TLB-A1C / Hgb A1C (Glycohemoglobin) [83036-A1C] 12)  TLB-Udip w/ Micro [81001-URINE] 13)  T-Hepatitis C Anti HCV [04540] 14)  T-Sprue Panel (Celiac Disease Aby Eval) [83516x3/[address]] 15)  T-Tissue Transglutamase Ab IgA [98119-14782] 16)  EKG w/ Interpretation [93000] 17)  Radiology Referral [Radiology] 18)  Nutrition Referral [Nutrition] 19)  Diabetic Clinic Referral [Diabetic] 20)  Neurology Referral [Neuro] 21)  Est. Patient Level IV [95621]

## 2011-02-11 ENCOUNTER — Ambulatory Visit: Payer: Self-pay | Admitting: Internal Medicine

## 2011-02-11 ENCOUNTER — Ambulatory Visit
Admission: RE | Admit: 2011-02-11 | Discharge: 2011-02-11 | Disposition: A | Discharge status: Home / Self Care | Payer: Medicare PPO | Source: Ambulatory Visit | Attending: Internal Medicine | Admitting: Internal Medicine

## 2011-02-11 DIAGNOSIS — R7402 Elevation of levels of lactic acid dehydrogenase (LDH): Secondary | ICD-10-CM

## 2011-02-11 DIAGNOSIS — R7401 Elevation of levels of liver transaminase levels: Secondary | ICD-10-CM

## 2011-02-18 ENCOUNTER — Encounter: Payer: Medicare PPO | Attending: Internal Medicine | Admitting: *Deleted

## 2011-02-18 DIAGNOSIS — Z713 Dietary counseling and surveillance: Secondary | ICD-10-CM | POA: Insufficient documentation

## 2011-02-18 DIAGNOSIS — E119 Type 2 diabetes mellitus without complications: Secondary | ICD-10-CM | POA: Insufficient documentation

## 2011-02-18 DIAGNOSIS — Z01818 Encounter for other preprocedural examination: Secondary | ICD-10-CM | POA: Insufficient documentation

## 2011-02-20 ENCOUNTER — Encounter: Payer: Self-pay | Admitting: Internal Medicine

## 2011-02-20 ENCOUNTER — Ambulatory Visit (INDEPENDENT_AMBULATORY_CARE_PROVIDER_SITE_OTHER): Payer: Medicare PPO | Admitting: Internal Medicine

## 2011-02-20 DIAGNOSIS — E039 Hypothyroidism, unspecified: Secondary | ICD-10-CM | POA: Insufficient documentation

## 2011-02-20 DIAGNOSIS — I10 Essential (primary) hypertension: Secondary | ICD-10-CM

## 2011-02-20 DIAGNOSIS — M654 Radial styloid tenosynovitis [de Quervain]: Secondary | ICD-10-CM

## 2011-02-20 DIAGNOSIS — K219 Gastro-esophageal reflux disease without esophagitis: Secondary | ICD-10-CM

## 2011-02-20 DIAGNOSIS — G4730 Sleep apnea, unspecified: Secondary | ICD-10-CM

## 2011-02-20 DIAGNOSIS — K7689 Other specified diseases of liver: Secondary | ICD-10-CM

## 2011-02-20 DIAGNOSIS — E119 Type 2 diabetes mellitus without complications: Secondary | ICD-10-CM

## 2011-02-20 DIAGNOSIS — K760 Fatty (change of) liver, not elsewhere classified: Secondary | ICD-10-CM | POA: Insufficient documentation

## 2011-02-20 LAB — HM DIABETES FOOT EXAM

## 2011-02-24 NOTE — Assessment & Plan Note (Signed)
Summary: 2 wk fu /nws #   Vital Signs:  Patient profile:   65 year old female Menstrual status:  postmenopausal Height:      66 inches Weight:      268 pounds O2 Sat:      97 % on Room air Temp:     98.5 degrees F oral Pulse rate:   84 / minute Pulse rhythm:   regular Resp:     16 per minute BP sitting:   140 / 68  (left arm) Cuff size:   large  Vitals Entered By: Estell Harpin CMA (February 20, 2011 2:15 PM)  O2 Flow:  Room air  Primary Care Provider:  Janith Lima MD   History of Present Illness:  Follow-Up Visit      This is a 65 year old woman who presents for Follow-up visit.  The patient denies chest pain, palpitations, dizziness, syncope, low blood sugar symptoms, high blood sugar symptoms, edema, SOB, DOE, PND, and orthopnea.  Since the last visit the patient notes no new problems or concerns.  The patient reports taking meds as prescribed, monitoring BP, monitoring blood sugars, and dietary noncompliance.  When questioned about possible medication side effects, the patient notes none.    She tells me that she went to the ER about 2 weeks ago b/c she was having left entire body pain and weakness and that all checked out fine except that she has tenconitis in her left wrist. She sees Dr. Jannifer Franklin next Monday about the weakness. She tells me that her brain scan did not show a stroke. The weakness has resolved but she still has pain, especially in the wrist.  Dyspepsia History:      She has no alarm features of dyspepsia including no history of melena, hematochezia, dysphagia, persistent vomiting, or involuntary weight loss > 5%.  There is a prior history of GERD.  The patient does not have a prior history of documented ulcer disease.  The dominant symptom is heartburn or acid reflux.  An H-2 blocker medication is not currently being taken.    Preventive Screening-Counseling & Management  Alcohol-Tobacco     Alcohol drinks/day: 0     Alcohol Counseling: not indicated;  patient does not drink     Smoking Status: never     Tobacco Counseling: not indicated; no tobacco use  Hep-HIV-STD-Contraception     Hepatitis Risk: no risk noted     HIV Risk: no risk noted     STD Risk: no risk noted      Sexual History:  currently monogamous.        Drug Use:  no.        Blood Transfusions:  no.    Clinical Review Panels:  Prevention   Last Mammogram:  Normal Bilateral (11/18/2010)   Last Pap Smear:  Normal (05/22/2003)   Last Colonoscopy:  Normal (02/23/2007)  Immunizations   Last Tetanus Booster:  Tdap (02/05/2010)   Last Flu Vaccine:  Fluvax 3+ (09/04/2010)  Lipid Management   Cholesterol:  121 (02/05/2011)   HDL (good cholesterol):  37.90 (02/05/2011)  Diabetes Management   HgBA1C:  6.4 (02/05/2011)   Creatinine:  0.8 (02/05/2011)   Last Dilated Eye Exam:  normal (02/20/2009)   Last Foot Exam:  yes (02/20/2011)   Last Flu Vaccine:  Fluvax 3+ (09/04/2010)  CBC   WBC:  11.1 (02/05/2011)   RBC:  3.39 (02/05/2011)   Hgb:  9.2 (02/05/2011)   Hct:  27.5 (02/05/2011)  Platelets:  362.0 (02/05/2011)   MCV  81.1 (02/05/2011)   MCHC  33.3 (02/05/2011)   RDW  17.1 (02/05/2011)   PMN:  57.8 (02/05/2011)   Lymphs:  30.6 (02/05/2011)   Monos:  9.1 (02/05/2011)   Eosinophils:  2.1 (02/05/2011)   Basophil:  0.4 (02/05/2011)  Complete Metabolic Panel   Glucose:  122 (02/05/2011)   Sodium:  140 (02/05/2011)   Potassium:  4.1 (02/05/2011)   Chloride:  106 (02/05/2011)   CO2:  25 (02/05/2011)   BUN:  11 (02/05/2011)   Creatinine:  0.8 (02/05/2011)   Albumin:  4.0 (02/05/2011)   Total Protein:  7.0 (02/05/2011)   Calcium:  9.4 (02/05/2011)   Total Bili:  0.6 (02/05/2011)   Alk Phos:  78 (02/05/2011)   SGPT (ALT):  33 (02/05/2011)   SGOT (AST):  36 (02/05/2011)   Current Medications (verified): 1)  Coreg 25 Mg Tabs (Carvedilol) .[name].. 1 By Mouth Two Times A Day 2)  Crestor 10 Mg Tabs (Rosuvastatin Calcium) .... Take 1 Tablet By Mouth Once A  Day 3)  Xanax 2 Mg Tabs (Alprazolam) .... Take 1 Tablet By Mouth Three Times A Day 4)  Lamictal 200 Mg Tabs (Lamotrigine) .... Take 1 Tablet By Mouth Once A Day 5)  Singulair 10 Mg Tabs (Montelukast Sodium) .... Take 1 Tablet By Mouth Once A Day 6)  Exforge 10-320 Mg Tabs (Amlodipine Besylate-Valsartan) .... Take 1 Tablet By Mouth Once A Day 7)  Bayer Contour Monitor W/device Kit (Blood Glucose Monitoring Suppl) .... Use Bid 8)  Bayer Contour Test  Strp (Glucose Blood) .... Use Bid 9)  Ventolin Hfa 108 (90 Base) Mcg/act Aers (Albuterol Sulfate) .... Use As Directed 10)  Actos 30 Mg Tabs (Pioglitazone Hcl) .... One By Mouth Once Daily For Diabetes 11)  Feosol 200 (65 Fe) Mg Tabs (Ferrous Sulfate Dried) .... One By Mouth Two Times A Day With Food 12)  Nitrostat 0.4 Mg Subl (Nitroglycerin) .... One Every 5 Mins Under Tongue For Chest Pain Up To 3 Times.  If Not Resolved Call 911 13)  Isosorbide Mononitrate Cr 60 Mg Xr24h-Tab (Isosorbide Mononitrate) .... 1/2 Daily 14)  Omeprazole 40 Mg Cpdr (Omeprazole) .... One By Mouth Once Daily For Heartburn 15)  Synthroid 50 Mcg Tabs (Levothyroxine Sodium) .... One By Mouth Once Daily For Thyroid  Allergies (verified): 1)  ! Penicillin  Past History:  Past Medical History: Last updated: 06/24/2010 Anemia-NOS Depression Diabetes mellitus, type II GERD Hyperlipidemia Hypertension Sleep apnea (CPAP) Asthma Hemorrhoids  Past Surgical History: Last updated: 06/24/2010 Benign tumors resected Tubal ligation  Family History: Last updated: 06/24/2010 Family History of Alcoholism/Addiction Family History Hypertension Family History of Cardiovascular disorder (A brother had coronary artery disease dying at age 35. Her mother died at 30 with alcohol abuse and a myocardial infarction)  Social History: Last updated: 05/06/2009 Married Never Smoked Alcohol use-no Drug use-no Regular exercise-no Disabled  Risk Factors: Alcohol Use: 0  (02/20/2011) Exercise: no (05/06/2009)  Risk Factors: Smoking Status: never (02/20/2011)  Family History: Reviewed history from 06/24/2010 and no changes required. Family History of Alcoholism/Addiction Family History Hypertension Family History of Cardiovascular disorder (A brother had coronary artery disease dying at age 26. Her mother died at 58 with alcohol abuse and a myocardial infarction)  Social History: Reviewed history from 05/06/2009 and no changes required. Married Never Smoked Alcohol use-no Drug use-no Regular exercise-no Disabled  Review of Systems       The patient complains of weight gain and severe  indigestion/heartburn.  The patient denies anorexia, fever, weight loss, chest pain, syncope, dyspnea on exertion, peripheral edema, prolonged cough, headaches, hemoptysis, abdominal pain, melena, hematochezia, hematuria, suspicious skin lesions, transient blindness, difficulty walking, depression, unusual weight change, abnormal bleeding, and enlarged lymph nodes.   General:  Complains of fatigue, malaise, and sleep disorder; denies chills, fever, loss of appetite, sweats, weakness, and weight loss. Endo:  Complains of weight change; denies cold intolerance, excessive hunger, excessive thirst, excessive urination, heat intolerance, and polyuria.  Physical Exam  General:  alert, well-developed, well-nourished, well-hydrated, cooperative to examination, good hygiene, and overweight-appearing.   Head:  normocephalic, atraumatic, no abnormalities observed, and no abnormalities palpated.   Mouth:  Oral mucosa and oropharynx without lesions or exudates.  Teeth in good repair. Neck:  supple, full ROM, no masses, no carotid bruits, no cervical lymphadenopathy, and no neck tenderness.   Lungs:  Normal respiratory effort, chest expands symmetrically. Lungs are clear to auscultation, no crackles or wheezes. Heart:  Normal rate and regular rhythm. S1 and S2 normal without gallop,  murmur, click, rub or other extra sounds. Abdomen:  soft, non-tender, normal bowel sounds, no distention, no masses, no guarding, no hepatomegaly, and no splenomegaly.   Msk:  normal ROM, no joint tenderness, no joint swelling, no joint warmth, no redness over joints, no joint deformities, no joint instability, and no crepitation.   Pulses:  R and L carotid,radial,femoral,dorsalis pedis and posterior tibial pulses are full and equal bilaterally Extremities:  trace left pedal edema and trace right pedal edema.   Neurologic:  No cranial nerve deficits noted. Station and gait are normal. Plantar reflexes are down-going bilaterally. DTRs are symmetrical throughout. Sensory, motor and coordinative functions appear intact. Skin:  turgor normal, color normal, no rashes, no suspicious lesions, no ecchymoses, no petechiae, no purpura, no ulcerations, and no edema.   Cervical Nodes:  no anterior cervical adenopathy and no posterior cervical adenopathy.   Psych:  Oriented X3, memory intact for recent and remote, normally interactive, good eye contact, not anxious appearing, not depressed appearing, not agitated, not suicidal, and not homicidal.    Diabetes Management Exam:    Foot Exam (with socks and/or shoes not present):       Sensory-Pinprick/Light touch:          Left medial foot (L-4): normal          Left dorsal foot (L-5): normal          Left lateral foot (S-1): normal          Right medial foot (L-4): normal          Right dorsal foot (L-5): normal          Right lateral foot (S-1): normal       Sensory-Monofilament:          Left foot: normal          Right foot: normal       Inspection:          Left foot: normal          Right foot: normal       Nails:          Left foot: normal          Right foot: normal   Impression & Recommendations:  Problem # 1:  DE QUERVAIN'S TENOSYNOVITIS, 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 One by mouth once daily for thyroid  Labs Reviewed: TSH: 6.06 (02/05/2011)    HgBA1c: 6.4 (02/05/2011) Chol: 121 (02/05/2011)   HDL: 37.90 (02/05/2011)   TG: 219.0 (02/05/2011)  Problem # 4:  FATTY LIVER DISEASE (ICD-571.8) Assessment: New stay on actos  Problem # 5:  HYPERTENSION (ICD-401.9) Assessment: Improved  Her updated medication list for this problem includes:    Coreg 25 Mg Tabs (Carvedilol) .[name]... 1 by mouth two times a day    Exforge 10-320 Mg Tabs (Amlodipine besylate-valsartan) .[name]... Take 1 tablet by mouth once a day  BP today: 140/68 Prior BP: 158/82 (02/05/2011)  Prior 10 Yr Risk Heart Disease: Not enough information (05/06/2009)  Labs Reviewed: K+: 4.1 (02/05/2011) Creat: : 0.8 (02/05/2011)   Chol: 121 (02/05/2011)   HDL: 37.90 (02/05/2011)   TG: 219.0 (02/05/2011)  Problem # 6:  DIABETES MELLITUS, TYPE II (ICD-250.00) Assessment: Improved  Her updated medication list for this problem includes:    Exforge 10-320 Mg Tabs (Amlodipine besylate-valsartan) .[name]... Take 1 tablet by mouth once a day    Actos 30 Mg Tabs (Pioglitazone hcl) ..... One by mouth once daily for diabetes  Labs Reviewed: Creat: 0.8 (02/05/2011)     Last Eye Exam: normal (02/20/2009) Reviewed HgBA1c results: 6.4 (02/05/2011)  6.7 (10/17/2010)  Problem # 7:  GERD (ICD-530.81) Assessment: Unchanged  The following medications were removed from the medication list:    Kapidex 60 Mg Cpdr (Dexlansoprazole) ..... Hold Her updated medication list for this problem includes:    Omeprazole 40 Mg Cpdr (Omeprazole) ..... One by mouth once daily for heartburn  Labs Reviewed: Hgb: 9.2 (02/05/2011)   Hct: 27.5  (02/05/2011)  Complete Medication List: 1)  Coreg 25 Mg Tabs (Carvedilol) .[name].. 1 by mouth two times a day 2)  Crestor 10 Mg Tabs (Rosuvastatin calcium) .... Take 1 tablet by mouth once a day 3)  Xanax 2 Mg Tabs (Alprazolam) .... Take 1 tablet by mouth three times a day 4)  Lamictal 200 Mg Tabs (Lamotrigine) .... Take 1 tablet by mouth once a day 5)  Singulair 10 Mg Tabs (Montelukast sodium) .... Take 1 tablet by mouth once a day 6)  Exforge 10-320 Mg Tabs (Amlodipine besylate-valsartan) .... Take 1 tablet by mouth once a day 7)  Landscape architect W/device Kit (Blood glucose monitoring suppl) .... Use bid 8)  Bayer Contour Test Strp (Glucose blood) .... Use bid 9)  Ventolin Hfa 108 (90 Base) Mcg/act Aers (Albuterol sulfate) .... Use as directed 10)  Actos 30 Mg Tabs (Pioglitazone hcl) .... One by mouth once daily for diabetes 11)  Feosol 200 (65 Fe) Mg Tabs (Ferrous sulfate dried) .... One by mouth two times a day with food 12)  Nitrostat 0.4 Mg Subl (Nitroglycerin) .... One every 5 mins under tongue for chest pain up to 3 times.  if not resolved call 911 13)  Isosorbide Mononitrate Cr 60 Mg Xr24h-tab (Isosorbide mononitrate) .... 1/2 daily 14)  Omeprazole 40 Mg Cpdr (Omeprazole) .... One by mouth once daily for heartburn 15)  Synthroid 50 Mcg Tabs (Levothyroxine sodium) .... One by mouth once daily for thyroid   Patient Instructions: 1)  Please schedule a follow-up appointment in 2 months. 2)  It is important that you exercise regularly at least 20 minutes 5 times a week. If  you develop chest pain, have severe difficulty breathing, or feel very tired , stop exercising immediately and seek medical attention. 3)  You need to lose weight. Consider a lower calorie diet and regular exercise.  4)  Check your blood sugars regularly. If your readings are usually above 200 or below 70 you should contact our office. 5)  It is important that your Diabetic A1c level is checked every 3 months. 6)   See your eye doctor yearly to check for diabetic eye damage. 7)  Check your feet each night for sore areas, calluses or signs of infection. 8)  Check your Blood Pressure regularly. If it is above 130/80: you should make an appointment. 9)  Take 650-1074m of Tylenol every 4-6 hours as needed for relief of pain or comfort of fever AVOID taking more than 40084m in a 24 hour period (can cause liver damage in higher doses). 10)  Take 400-60073mf Ibuprofen (Advil, Motrin) with food every 4-6 hours as needed for relief of pain or comfort of fever. Prescriptions: EXFORGE 10-320 MG TABS (AMLODIPINE BESYLATE-VALSARTAN) Take 1 tablet by mouth once a day  #30 x 11   Entered and Authorized by:   ThoJanith Lima   Signed by:   ThoJanith Lima on 02/20/2011   Method used:   Electronically to        [facility] (retail)       [address]    [place]  [id number]    Ph: [phone]    Fax: [phone]:  [id number] 50 MCG TABS (LEVOTHYROXINE SODIUM) One by mouth once daily for thyroid  #30 x 11   Entered and Authorized by:   ThoJanith Lima   Signed by:   ThoJanith Lima on 02/20/2011   Method used:   Electronically to        [facility] (retail)       [address]    [place]  [id number]    Ph: [phone]    Fax: [phone]:   [id number] 40 MG CPDR (OMEPRAZOLE) One by mouth once daily for heartburn  #30 x 11   Entered and Authorized by:   ThoJanith Lima   Signed by:   ThoJanith Lima on 02/20/2011   Method used:   Electronically to        [facility] (retail)       [address]    [place]  [id number]    Ph: [phone]    Fax: [phone]:   [id number] Orders Added: 1)  Sleep Disorder Referral [Sleep Disorder] 2)  Occupational Therapy [OT] 3)  Est. Patient Level  IV [id number]

## 2011-02-28 LAB — URINE MICROSCOPIC-ADD ON

## 2011-02-28 LAB — CBC
HCT: 31.5 % — ABNORMAL LOW (ref 36.0–46.0)
Hemoglobin: 10.4 g/dL — ABNORMAL LOW (ref 12.0–15.0)
MCH: 29.1 pg (ref 26.0–34.0)
MCHC: 32.9 g/dL (ref 30.0–36.0)
MCV: 88.5 fL (ref 78.0–100.0)
Platelets: 443 10*3/uL — ABNORMAL HIGH (ref 150–400)
RBC: 3.56 MIL/uL — ABNORMAL LOW (ref 3.87–5.11)
RDW: 17.5 % — ABNORMAL HIGH (ref 11.5–15.5)
WBC: 11.8 10*3/uL — ABNORMAL HIGH (ref 4.0–10.5)

## 2011-02-28 LAB — POCT CARDIAC MARKERS
CKMB, poc: <1 ng/mL — ABNORMAL LOW (ref 1.0–8.0)
CKMB, poc: <1 ng/mL — ABNORMAL LOW (ref 1.0–8.0)
Myoglobin, poc: 57.3 ng/mL (ref 12–200)
Myoglobin, poc: 76 ng/mL (ref 12–200)
Troponin i, poc: <0.05 ng/mL (ref 0.00–0.09)
Troponin i, poc: <0.05 ng/mL (ref 0.00–0.09)

## 2011-02-28 LAB — APTT: aPTT: 31 seconds (ref 24–37)

## 2011-02-28 LAB — COMPREHENSIVE METABOLIC PANEL
ALT: 35 U/L (ref 0–35)
AST: 40 U/L — ABNORMAL HIGH (ref 0–37)
Albumin: 3.9 g/dL (ref 3.5–5.2)
Alkaline Phosphatase: 100 U/L (ref 39–117)
BUN: 12 mg/dL (ref 6–23)
CO2: 27 mEq/L (ref 19–32)
Calcium: 9.6 mg/dL (ref 8.4–10.5)
Chloride: 104 mEq/L (ref 96–112)
Creatinine, Ser: 0.94 mg/dL (ref 0.4–1.2)
GFR calc Af Amer: >60 mL/min (ref >60)
GFR calc non Af Amer: >60 mL/min (ref >60)
Glucose, Bld: 128 mg/dL — ABNORMAL HIGH (ref 70–99)
Potassium: 4.2 mEq/L (ref 3.5–5.1)
Sodium: 138 mEq/L (ref 135–145)
Total Bilirubin: 0.3 mg/dL (ref 0.3–1.2)
Total Protein: 7.7 g/dL (ref 6.0–8.3)

## 2011-02-28 LAB — URINALYSIS, ROUTINE W REFLEX MICROSCOPIC
Bilirubin Urine: NEGATIVE
Glucose, UA: NEGATIVE mg/dL
Hgb urine dipstick: NEGATIVE
Ketones, ur: NEGATIVE mg/dL
Nitrite: NEGATIVE
Protein, ur: NEGATIVE mg/dL
Specific Gravity, Urine: 1.017 (ref 1.005–1.030)
Urobilinogen, UA: 1 mg/dL (ref 0.0–1.0)
pH: 7 (ref 5.0–8.0)

## 2011-02-28 LAB — PROTIME-INR
INR: 0.97 (ref 0.00–1.49)
Prothrombin Time: 12.8 seconds (ref 11.6–15.2)

## 2011-03-12 ENCOUNTER — Ambulatory Visit: Payer: Medicare PPO | Attending: Internal Medicine | Admitting: Occupational Therapy

## 2011-03-12 DIAGNOSIS — M65849 Other synovitis and tenosynovitis, unspecified hand: Secondary | ICD-10-CM | POA: Insufficient documentation

## 2011-03-12 DIAGNOSIS — M65839 Other synovitis and tenosynovitis, unspecified forearm: Secondary | ICD-10-CM | POA: Insufficient documentation

## 2011-03-12 DIAGNOSIS — M25539 Pain in unspecified wrist: Secondary | ICD-10-CM | POA: Insufficient documentation

## 2011-03-12 DIAGNOSIS — IMO0001 Reserved for inherently not codable concepts without codable children: Secondary | ICD-10-CM | POA: Insufficient documentation

## 2011-03-19 ENCOUNTER — Ambulatory Visit: Payer: Medicare PPO | Attending: Internal Medicine | Admitting: Occupational Therapy

## 2011-03-19 DIAGNOSIS — IMO0001 Reserved for inherently not codable concepts without codable children: Secondary | ICD-10-CM | POA: Insufficient documentation

## 2011-03-19 DIAGNOSIS — M65839 Other synovitis and tenosynovitis, unspecified forearm: Secondary | ICD-10-CM | POA: Insufficient documentation

## 2011-03-19 DIAGNOSIS — M25539 Pain in unspecified wrist: Secondary | ICD-10-CM | POA: Insufficient documentation

## 2011-03-19 DIAGNOSIS — M65849 Other synovitis and tenosynovitis, unspecified hand: Secondary | ICD-10-CM | POA: Insufficient documentation

## 2011-03-23 ENCOUNTER — Ambulatory Visit: Payer: Medicare PPO | Admitting: *Deleted

## 2011-03-24 ENCOUNTER — Ambulatory Visit: Payer: Medicare PPO | Admitting: Occupational Therapy

## 2011-03-26 ENCOUNTER — Institutional Professional Consult (permissible substitution): Payer: Medicare PPO | Admitting: Pulmonary Disease

## 2011-03-27 ENCOUNTER — Ambulatory Visit: Payer: Medicare PPO | Admitting: Occupational Therapy

## 2011-04-01 ENCOUNTER — Ambulatory Visit: Payer: Medicare PPO | Admitting: Occupational Therapy

## 2011-04-03 ENCOUNTER — Ambulatory Visit: Payer: Medicare PPO | Admitting: Occupational Therapy

## 2011-04-08 ENCOUNTER — Ambulatory Visit: Payer: Medicare PPO | Admitting: Occupational Therapy

## 2011-04-10 ENCOUNTER — Ambulatory Visit: Payer: Medicare PPO | Admitting: Occupational Therapy

## 2011-04-15 ENCOUNTER — Ambulatory Visit: Payer: Medicare PPO | Attending: Internal Medicine | Admitting: Occupational Therapy

## 2011-04-15 DIAGNOSIS — IMO0001 Reserved for inherently not codable concepts without codable children: Secondary | ICD-10-CM | POA: Insufficient documentation

## 2011-04-15 DIAGNOSIS — M65849 Other synovitis and tenosynovitis, unspecified hand: Secondary | ICD-10-CM | POA: Insufficient documentation

## 2011-04-15 DIAGNOSIS — M25539 Pain in unspecified wrist: Secondary | ICD-10-CM | POA: Insufficient documentation

## 2011-04-15 DIAGNOSIS — M65839 Other synovitis and tenosynovitis, unspecified forearm: Secondary | ICD-10-CM | POA: Insufficient documentation

## 2011-04-17 ENCOUNTER — Ambulatory Visit: Payer: Medicare PPO | Admitting: Occupational Therapy

## 2011-04-20 ENCOUNTER — Institutional Professional Consult (permissible substitution): Payer: Medicare PPO | Admitting: Pulmonary Disease

## 2011-04-22 ENCOUNTER — Ambulatory Visit: Payer: Medicare PPO | Admitting: Occupational Therapy

## 2011-04-23 ENCOUNTER — Encounter: Payer: Self-pay | Admitting: Internal Medicine

## 2011-04-24 ENCOUNTER — Encounter: Payer: Medicare PPO | Admitting: Occupational Therapy

## 2011-04-24 ENCOUNTER — Ambulatory Visit: Payer: Medicare PPO | Admitting: Occupational Therapy

## 2011-04-24 ENCOUNTER — Encounter: Payer: Self-pay | Admitting: Internal Medicine

## 2011-04-24 ENCOUNTER — Other Ambulatory Visit (INDEPENDENT_AMBULATORY_CARE_PROVIDER_SITE_OTHER): Payer: Medicare PPO

## 2011-04-24 ENCOUNTER — Ambulatory Visit: Payer: Medicare PPO | Admitting: Internal Medicine

## 2011-04-24 ENCOUNTER — Ambulatory Visit (INDEPENDENT_AMBULATORY_CARE_PROVIDER_SITE_OTHER): Payer: Medicare PPO | Admitting: Internal Medicine

## 2011-04-24 DIAGNOSIS — R7402 Elevation of levels of lactic acid dehydrogenase (LDH): Secondary | ICD-10-CM

## 2011-04-24 DIAGNOSIS — R7401 Elevation of levels of liver transaminase levels: Secondary | ICD-10-CM

## 2011-04-24 DIAGNOSIS — E039 Hypothyroidism, unspecified: Secondary | ICD-10-CM

## 2011-04-24 DIAGNOSIS — I10 Essential (primary) hypertension: Secondary | ICD-10-CM

## 2011-04-24 DIAGNOSIS — K7689 Other specified diseases of liver: Secondary | ICD-10-CM

## 2011-04-24 DIAGNOSIS — M654 Radial styloid tenosynovitis [de Quervain]: Secondary | ICD-10-CM

## 2011-04-24 DIAGNOSIS — E785 Hyperlipidemia, unspecified: Secondary | ICD-10-CM

## 2011-04-24 DIAGNOSIS — E119 Type 2 diabetes mellitus without complications: Secondary | ICD-10-CM

## 2011-04-24 LAB — COMPREHENSIVE METABOLIC PANEL
ALT: 29 U/L (ref 0–35)
AST: 32 U/L (ref 0–37)
Albumin: 3.8 g/dL (ref 3.5–5.2)
Alkaline Phosphatase: 76 U/L (ref 39–117)
BUN: 10 mg/dL (ref 6–23)
CO2: 28 mEq/L (ref 19–32)
Calcium: 9.4 mg/dL (ref 8.4–10.5)
Chloride: 106 mEq/L (ref 96–112)
Creatinine, Ser: 0.8 mg/dL (ref 0.4–1.2)
GFR: 95.89 mL/min (ref >60.00)
Glucose, Bld: 101 mg/dL — ABNORMAL HIGH (ref 70–99)
Potassium: 4 mEq/L (ref 3.5–5.1)
Sodium: 142 mEq/L (ref 135–145)
Total Bilirubin: 0.4 mg/dL (ref 0.3–1.2)
Total Protein: 7 g/dL (ref 6.0–8.3)

## 2011-04-24 LAB — CBC WITH DIFFERENTIAL/PLATELET
Basophils Absolute: 0 10*3/uL (ref 0.0–0.1)
Basophils Relative: 0.5 % (ref 0.0–3.0)
Eosinophils Absolute: 0.2 10*3/uL (ref 0.0–0.7)
Eosinophils Relative: 2 % (ref 0.0–5.0)
HCT: 29.2 % — ABNORMAL LOW (ref 36.0–46.0)
Hemoglobin: 9.5 g/dL — ABNORMAL LOW (ref 12.0–15.0)
Lymphocytes Relative: 35.9 % (ref 12.0–46.0)
Lymphs Abs: 3.7 10*3/uL (ref 0.7–4.0)
MCHC: 32.7 g/dL (ref 30.0–36.0)
MCV: 77.8 fl — ABNORMAL LOW (ref 78.0–100.0)
Monocytes Absolute: 0.7 10*3/uL (ref 0.1–1.0)
Monocytes Relative: 7.2 % (ref 3.0–12.0)
Neutro Abs: 5.6 10*3/uL (ref 1.4–7.7)
Neutrophils Relative %: 54.4 % (ref 43.0–77.0)
Platelets: 366 10*3/uL (ref 150.0–400.0)
RBC: 3.75 Mil/uL — ABNORMAL LOW (ref 3.87–5.11)
RDW: 19.8 % — ABNORMAL HIGH (ref 11.5–14.6)
WBC: 10.3 10*3/uL (ref 4.5–10.5)

## 2011-04-24 LAB — TSH: TSH: 2.05 u[IU]/mL (ref 0.35–5.50)

## 2011-04-24 LAB — HEMOGLOBIN A1C: Hgb A1c MFr Bld: 6.6 % — ABNORMAL HIGH (ref 4.6–6.5)

## 2011-04-24 MED ORDER — CELECOXIB 200 MG PO CAPS
200.0000 mg | ORAL_CAPSULE | Freq: Every day | ORAL | Status: DC
Start: 2011-04-24 — End: 2012-03-21

## 2011-04-24 MED ORDER — CARVEDILOL 25 MG PO TABS: 25.0000 mg | ORAL_TABLET | Freq: Two times a day (BID) | ORAL | Status: DC

## 2011-04-24 NOTE — Patient Instructions (Signed)
Diabetes, Type 2 Diabetes is a lasting (chronic) disease. In type 2 diabetes, the pancreas does not make enough insulin (a hormone), and the body does not respond normally to the insulin that is made. This type of diabetes was also previously called adult onset diabetes. About 90% of all those who have diabetes have type 2. It usually occurs after the age of 40 but can occur at any age. CAUSES Unlike type 1 diabetes, which happens because insulin is no longer being made, type 2 diabetes happens because the body is making less insulin and has trouble using the insulin properly. SYMPTOMS  Drinking more than usual.   Urinating more than usual.   Blurred vision.   Dry, itchy skin.   Frequent infection like yeast infections in women.   More tired than usual (fatigue).  TREATMENT  Healthy eating.   Exercise.   Medication, if needed.   Monitoring blood glucose (sugar).   Seeing your caregiver regularly.  HOME CARE INSTRUCTIONS  Check your blood glucose (sugar) at least once daily. More frequent monitoring may be necessary, depending on your medications and on how well your diabetes is controlled. Your caregiver will advise you.   Take your medicine as directed by your caregiver.   Do not smoke.   Make wise food choices. Ask your caregiver for information. Weight loss can improve your diabetes.   Learn about low blood glucose (hypoglycemia) and how to treat it.   Get your eyes checked regularly.   Have a yearly physical exam. Have your blood pressure checked. Get your blood and urine tested.   Wear a pendant or bracelet saying that you have diabetes.   Check your feet every night for sores. Let your caregiver know if you have sores that are not healing.  SEEK MEDICAL CARE IF:  You are having problems keeping your blood glucose at target range.   You feel you might be having problems with your medicines.   You have symptoms of an illness that is not improving after 24  hours.   You have a sore or wound that is not healing.   You notice a change in vision or a new problem with your vision.   You develop a fever of more than 100.5.  Document Released: 11/30/2005 Document Re-Released: 12/22/2009 [employer] Patient Information 2011 [employer]. 

## 2011-04-29 ENCOUNTER — Encounter: Payer: Medicare PPO | Admitting: Occupational Therapy

## 2011-05-01 ENCOUNTER — Encounter: Payer: Medicare PPO | Admitting: Occupational Therapy

## 2011-05-01 NOTE — Cardiovascular Report (Signed)
Valerie [name], [name]               ACCOUNT NO.:  [id number]   MEDICAL RECORD NO.:  [id number]          PATIENT TYPE:  INP   LOCATION:  2037                         FACILITY:  [facility]   PHYSICIAN:  Ricki Rodriguez, M.D.  DATE OF BIRTH:  September 29, 1946   DATE OF PROCEDURE:  09/28/2006  DATE OF DISCHARGE:  09/28/2006                              CARDIAC CATHETERIZATION   DATE OF PROCEDURE:  September 28, 2006.   PROCEDURE PERFORMED BY:  Dr. Orpah Cobb.   PATIENT REFERRED BY:  Dr. Alinda Money.   PROCEDURES PERFORMED:  Left heart catheterization, selective coronary  angiography, left ventricular function study.   INDICATION:  This 65 year old black female has cardiac risk factors of  hypertension, borderline diabetes, hyperlipidemia, obesity, and had  recurrent chest pain along with coronary artery disease from previous  cardiac catheterization.   APPROACH:  Right femoral artery using 5 ring sheath and catheters.   COMPLICATIONS:  None.   HEMODYNAMIC DATA:  The left ventricle pressure is 145/20 and aortic pressure  is 141/80, less than 65 cc of dye was used.   CORONARY ANATOMY:  The left main coronary artery was short and had mild  calcification, otherwise it was unremarkable.  Left anterior descending coronary artery:  The left anterior descending  coronary artery also had minimal calcification and otherwise, it was normal  and it wrapped around the apex of the heart.  The diagonal vessel was also  unremarkable.  Left circumflex coronary artery:  The left circumflex coronary artery was  also unremarkable.  His ramus branch was a large vessel and normal, and  obtuse marginal branch was also normal.  Right coronary artery:  The right coronary artery was dominant and  unremarkable, and posterior lateral branch and posterior descending coronary  artery were also normal.  Left ventriculogram:  The left ventriculogram showed normal left  intersystolic function with ejection fraction of  70%.   IMPRESSION:  1. Normal coronaries.  2. Normal left intersystolic function.  3. Non cardiac chest pain.   RECOMMENDATIONS:  This patient will continue her medical treatment and may  undergo lifestyle modification, and possible additional non cardiac chest  pain evaluation.      Ricki Rodriguez, M.D.  Electronically Signed     ASK/MEDQ  D:  09/28/2006  T:  09/28/2006  Job:  [id number]

## 2011-05-01 NOTE — Discharge Summary (Signed)
Valerie [name], Valerie [name]               ACCOUNT NO.:  [id number]   MEDICAL RECORD NO.:  [id number]          PATIENT TYPE:  INP   LOCATION:  4714                         FACILITY:  [facility]   PHYSICIAN:  Osvaldo Shipper. Spruill, M.D.DATE OF BIRTH:  Nov 26, 1946   DATE OF ADMISSION:  03/18/2007  DATE OF DISCHARGE:  03/23/2007                               DISCHARGE SUMMARY   DISCHARGE DIAGNOSES:  1. Asthma acute exacerbation.  2. Chest pain.  3. Hypertension.  4. Esophageal reflux disease.  5. Neurotic depression.  6. Obstructive sleep apnea.   History and physical examination obtained by Dr. Vickey Sages revealed  the patient presented to the [facility] due to anxiety attack  and chest pain.  The patient is a 65 year old who has a history of  anxiety attack.  She also complained of pain in the chest.  It was  located primarily in the left chest, but with no significant radiation  and she described it as a pressure sensation.  On examination, the  patient was slightly tachycardiac at 102.  The remainder of the vitals  were essentially within normal limits.  There were no significant  changes on the physical examination.  The initial set of cardiac markers  were negative.  The patient was subsequently admitted for chest pain,  anxiety and diabetes mellitus.   The patient was admitted to the medical service.  She was placed on  oxygen at 2 liters per minute.  She was placed on a 4 gram sodium, fat  modified diet per protocol.  She was allowed to use her own CPAP  machine.  She was seen up by pharmacy for heparin protocol and was  scheduled for a Persantine Cardiolite study.   On March 19, 2007 the patient began to feel some better.  On March 20, 2007 the MICU Medicine Team notified Dr. Shana Chute that the Myoview would  need to be rescheduled.   Plans were made for spiral CT chest to rule out PE and this was found to  be negative.  On March 23, 2007 after it was noted that the patient had  shown significant improvement, it was the opinion that the patient could  be discharged home and the remainder of her workup be completed as an  outpatient per Dr. Shana Chute.   MEDICATIONS AT DISCHARGE:  1. Exforge 10/320 one each morning.  2. Lamictal 150 mg each day.  3. Prozac 20 mg twice a day.  4. Xopenex for nebulizer treatments at home.  5. Metformin b.i.d.  6. Protonix 40 mg daily.  7. Coreg 12.5 mg twice a day.  8. Xanax 2 mg three times a day.  9. Singulair 10 mg each morning.  10.Prozac 20 mg twice a day.  11.Crestor 10 mg daily.   The patient is notify the physician immediately if any changes, problems  or concerns.   ADDENDUM:  The patient's Myoview study revealed a calculated ejection  fraction of 77%.  There was a small area of decreased activity in the  mid to distal anterior wall on the stress images, however this appeared  normal on the resting images.  It was the opinion that this area should  be watched for any change in activity, but at this point was read as  negative.      Ivery Quale, P.A.      Osvaldo Shipper. Spruill, M.D.  Electronically Signed    HB/MEDQ  D:  05/18/2007  T:  05/19/2007  Job:  [id number]

## 2011-05-01 NOTE — H&P (Signed)
Valerie [name], Valerie [name]                         ACCOUNT NO.:  [id number]   MEDICAL RECORD NO.:  [id number]                   PATIENT TYPE:  IPS   LOCATION:  0305                                 FACILITY:  [facility]   PHYSICIAN:  Jeanice Lim, M.D.              DATE OF BIRTH:  05/01/1946   DATE OF ADMISSION:  11/28/2003  DATE OF DISCHARGE:                         PSYCHIATRIC ADMISSION ASSESSMENT   DATE OF ASSESSMENT:  November 29, 2003, at 8:30 a.m.   CHIEF COMPLAINT:  The devil is telling me to walk in front of a car.   PATIENT IDENTIFICATION:  This is a 65 year old African-American female who  is married.  This is a voluntary admission.   HISTORY OF PRESENT ILLNESS:  This patient was referred by her psychiatrist,  Milagros Evener, M.D., for episodes of agitation and panic, which had become  worse in the past week.  The patient reports that she has been having panic  episodes anywhere from three to four times weekly, sometimes occurring twice  in one day.  She describes the panic episode that happened yesterday when  began crying and was unable to stop crying.  She denies that she had any  feelings of tachycardia or shortness of breath.  She does have some episodes  of chest tightness and aching and tight feeling in her chest and has had a  negative cardiac workup in numerous examinations by her Osvaldo Shipper. Spruill,  M.D., her cardiologist.  She also reports that one day last week when she  was on her way to an appointment, all of a sudden her vision got dark and  she felt unable to see things but she took an extra Valium as her  psychiatrist had requested and the episode resolved.  The patient states  that for the past week to two weeks she has been hearing voices calling her  name occasionally and that the devil has been speaking to her and has  ordered her to walk out in front of a car and she has had difficulty  resisting.  She says that she has no zest for life, and sees no reason  to  go on living.  She has found out that her insurance company has denied her  disability request since she has been absent from work and this is creating  a Surveyor, quantity strain for her family.  She reports that her husband is  generally supportive but feels like the financial strain is taking a toll on  the family.  The patient endorses decreased sleep, reawakening every one and  a half to two hours at night, endorses auditory hallucinations, usually  hearing her name called or hearing odd sounds, also some command  hallucinations to step in front of a moving vehicle, which occurred at least  one time this week.  She endorses depressed mood, anhedonia, poor  concentration, and frequent forgetfulness.  She denies any homicidal  ideation but  feels that because of her panic attacks and she is driving to  appointments that she may be a danger to other people.   PAST PSYCHIATRIC HISTORY:  The patient is followed weekly with her  psychotherapist, Dr. Daiva Eves and Milagros Evener, M.D., her psychiatrist.  This is the second inpatient admission at [facility] with her first inpatient psychiatric admission in November 2004.  The  patient has no history of prior suicide attempts.   SUBSTANCE ABUSE HISTORY:  The patient has no history of substance abuse.   PAST MEDICAL HISTORY:  The patient is followed by Dr. Bascom Levels in [place],  who is her primary care physician, and is followed by Osvaldo Shipper. Spruill,  M.D., for cardiology.  She has a history of coronary artery disease, asthma,  and GERD; denies any history of seizures.  She is on multiple medications.   MEDICATIONS:  1. Prevacid 30 mg p.o. b.i.d.  2. Zoloft 150 mg p.o. daily.  Her Zoloft dose was increased to 150 mg     approximately three weeks ago by Milagros Evener, M.D.  3. Imipramine 50 mg p.o. daily.  4. Triamterene/HCTZ 37.5 mg/25 mg one daily in the morning.  5. Lotrel 10/20 mg p.o. daily.  6. Diazepam 10 mg  p.o. t.i.d.  7. Lipitor 10 mg p.o. daily.  8. Darvocet-N 100 one p.o. q.4-6h. p.r.n. for pain.  9. Reglan 20 mg one p.o. t.i.d. before meals and at h.s.  10.      Coreg 6.25 mg p.o. b.i.d.; this was added last week by Dr. Bascom Levels.  11.      Albuterol inhaler.  12.      Advair two puffs q.4h. p.r.n. for asthma.  13.      Advair Diskus 500/50 mg one puff p.o. daily.  14.      Nitroglycerin spray p.r.n. 0.4 mg one to two sprays at the onset of     any acute chest pain with maximum of three sprays in 15 minutes.  15.      Singulair 10 mg p.o. daily.  16.      Flexeril 10 mg p.o. t.i.d. p.r.n. for muscle spasms.  17.      Ambien 10 mg p.o. q.h.s.   The patient was also previously taking some Ritalin, apparently, which was  stopped this week by Milagros Evener, M.D.   DRUG ALLERGIES:  PENICILLIN and BANANAS.   REVIEW OF SYSTEMS:  Review of systems was remarkable for findings already  described; specifically decreased sleep with frequent awakening, sleeping  only one to two hours at a time with frequent awakening through the night;  decreased concentration, being easily distracted; difficulty focusing on  activities of daily living although no overt forgetfulness; some episodes of  chest pain and chest tightness when she feels panicky or overly stressed.  She has a history of an angioplasty in the past due to a 30% blockage, also  some history of reflux and was told she had a hiatal hernia.   PHYSICAL EXAMINATION:  GENERAL:  Please see the physical examination that is  in the record.  Findings are essentially negative.  VITAL SIGNS:  Her vital signs have been within normal limits.   LABORATORY DATA:  Diagnostic studies are currently pending.   SOCIAL HISTORY:  She is a married African-American female who lives at home  with her husband and she has a 73 year old handicapped son who is able to do his own activities of daily living  and assists around the home.  No legal  charges.  Her  husband is supportive.   FAMILY HISTORY:  She denies any history of mental illness or substance  abuse.   MENTAL STATUS EXAM:  This is a fully alert patient who is cooperative with a  somewhat irritable affect.  Speech is normal in pace and tone and in amount.  Mood is depressed and irritable.  Thought process is logical and coherent;  she is positive for suicidal ideation without any clear plan for this,  positive for auditory hallucinations that seem to be occurring  intermittently.  Although she does not appear internally distracted at this  time, it does seem clear that during these episodes of agitation, she may be  having some auditory hallucinations.  She clearly describes some episodes of  agitation, not sure that these are actually panic in the classic sense but  more agitated thought.  Concentration is satisfactory.  Insight is  satisfactory.  Intelligence is average to above average.  Impulse control  and judgment: Intact.   ADMISSION DIAGNOSES:   AXIS I:  Major depression, recurrent, severe with psychosis.   AXIS II:  Rule out personality disorder, not otherwise specified.   AXIS III:  1. Hypertension.  2. Coronary artery disease.  3. Asthma.  4. Gastroesophageal reflux disease.   AXIS IV:  Moderate financial stress since not working at this time with  significant financial problems.   AXIS V:  Current 36, past year 75.   INITIAL PLAN OF CARE:  Plan is to voluntarily admit the patient with q.44m.  checks in place.  She is able to promise safety on the unit.  At this point,  we are going to continue her Zoloft at 150 mg p.o. daily, encourage her to  attend groups, and will add Risperdal 0.25 mg q.6.h. p.r.n. for agitation  and 0.25 mg p.o. q.h.s.  We have discussed the plan with the patient, risks  and benefits and some potential side effects of the medications.  She has  asked some pertinent questions and is in agreement with the plan.  She has  been  participating in intensive individual and group psychotherapy.   ESTIMATED LENGTH OF STAY:  Five days.     Margaret A. Stephannie Peters                   Jeanice Lim, M.D.    MAS/MEDQ  D:  11/29/2003  T:  11/29/2003  Job:  [id number]

## 2011-05-01 NOTE — Procedures (Signed)
Valerie [name], Valerie [name]               ACCOUNT NO.:  [id number]   MEDICAL RECORD NO.:  [id number]          PATIENT TYPE:  OUT   LOCATION:  SLEEP CENTER                 FACILITY:  [facility]   PHYSICIAN:  Clinton D. Maple Hudson, M.D. DATE OF BIRTH:  1946/12/12   DATE OF STUDY:  05/25/2006                              NOCTURNAL POLYSOMNOGRAM   REFERRING PHYSICIAN:  Dr. Kevin Fenton C. Spruill.   INDICATIONS FOR STUDY:  Hypersomnia with sleep apnea.   EPWORTH SLEEPINESS SCORE:  07/24.   BMI:  40.1.   WEIGHT:  250 pounds.   Her medication list is reviewed and is significant for Lamictal, Prozac,  diazepam and Ambien.  A baseline diagnostic NPSG on 04/06/2006 had reported  an AHI of 105.8 per hour.  CPAP titration is requested.   SLEEP ARCHITECTURE:  Total sleep time 414 minutes with sleep efficiency 94%.  Stage I was 4%, stage II 39% stages III and IV were absent, REM was 58% of  total sleep time, indicating REM rebound.  Sleep latency was 4 minutes, REM  latency 51 minutes, awake after sleep onset 24 minutes, arousal index two  per hour.  Ambien was taken at 10:46 p.m.   RESPIRATORY DATA:  CPAP titration protocol.  CPAP was titrated to 24 CWP.  Adequate pressure control was apparent at 21 CWP.  AHI 0 per hour.  A medium  ResMed Quattro full-face mask was used with heated humidifier.   OXYGEN DATA:  Loud snoring was finally suppressed at higher CPAP pressures.  Oxygen saturation was held at 94-96% on CPAP with room air.   CARDIAC DATA:  Normal sinus rhythm.   MOVEMENT/PARASOMNIA:  Occasional leg jerks with little effect on sleep.  Bathroom times one.   IMPRESSION/RECOMMENDATIONS:  1.  Successful CPAP titration to 21 CWP, AHI 0 per hour.  Medium ResMed      Quattro full-face mask was used with heated humidifier.  2.  Baseline NPSG on 04/06/2006 had recorded an AHI of 105.8 per hour.  3.  Ambien was taken to assist with sleep onset.  58% of this study night      was spent in REM sleep which is  unusually high and most likely resulted      from previous inability to sustain adequate amounts of REM sleep due      to interference from sleep apnea.  Sleep architecture is expected to      normalize with continued use of home CPAP.      Clinton D. Maple Hudson, M.D.  Diplomate, Biomedical engineer of Sleep Medicine  Electronically Signed     CDY/MEDQ  D:  05/30/2006 09:31:43  T:  05/31/2006 10:31:31  Job:  [id number]

## 2011-05-01 NOTE — Assessment & Plan Note (Signed)
[facility]                           GASTROENTEROLOGY OFFICE NOTE   NAME:[name], Valerie [name]                      MRN:          [id number]  DATE:10/18/2006                            DOB:          03/06/1945    REFERRED BY:  Patient was self referred.   PRIMARY CARE PHYSICIAN:  Jarome Matin, M.D.   REASON FOR SELF REFERRAL:  Rectal bleeding, GERD.   HISTORY OF PRESENT ILLNESS:  [name] is a 65 year old woman who has had  problems with bleeding hemorrhoids for at least 10 years or so. She has been  seen by Dr. Orson Slick at [facility] for injections and banding,  she has not seen him in about 2 years. The hemorrhoids bleed approximately 2-  3 times a week. She says it has been a little worse lately. She was recently  hospitalized and her hemoglobin was found to be 10.0 on October 16. She has  had sigmoidoscopies but has never had a full colonoscopy and does have a  brother who died of colon cancer.   She also has chronic GERD symptoms of burning in her chest. She says this  could be very severe sometimes. She has been on Protonix which does seem to  help but no completely eradicate it. It is not clear that she is taking the  Protonix at the correct time in relation to food. She has also recently had  an episode of chest pain that was thought maybe to be coronary although her  angiogram was negative. This was about a week ago. She is wondering if this  is related to her GERD symptoms.   REVIEW OF SYSTEMS:  Notable for 50 pound weight gain in the past one year.  The rest of her review of systems essentially normal and is available on  nurse intake sheet.   PAST MEDICAL HISTORY:  1. Morbid obesity.  2. GERD.  3. Bleeding hemorrhoids.  4. Hypertension.  5. Asthma.  6. Elevated cholesterol.  7. Depression.  8. Anxiety.  9. Sleep apnea.  10.Tubal ligation.  11.Cyst removed from her left breast.  12.Cyst removed from her  bottom in 2004.   CURRENT MEDICATIONS:  1. Protonix 40 mg twice daily.  2. Maxzide.  3. Singulair.  4. Valium.  5. Lotrel.  6. Coreg.  7. Reglan.  8. Prozac.  9. Lamictal.   ALLERGIES:  PENICILLIN ADHESIVE.   SOCIAL HISTORY:  Married with 3 children, nonsmoker, nondrinker. Drinks 2-3  Pepsi's a day plus tea once or twice a week. Eats chocolate daily to every  other day, not a peppermint eater.   FAMILY HISTORY:  Brother died of colon cancer. Sister and brother with colon  polyps.   PHYSICAL EXAMINATION:  VITAL SIGNS:  Weight is 250 pounds, blood pressure  124/82, pulse 100.  CONSTITUTIONAL:  Obese otherwise well-appearing.  NEUROLOGIC:  Oriented x3.  HEENT:  Extraocular movements intact. Mouth, oropharynx moist, no lesions.  NECK:  Supple, no lymphadenopathy.  CARDIOVASCULAR:  Heart regular rate and rhythm.  LUNGS:  Clear to auscultation bilaterally.  ABDOMEN: Soft, nontender, nondistended, normal  bowel sounds.  EXTREMITIES:  No lower extremity edema.  SKIN:  No rashes, lesions or visible extremities.   ASSESSMENT:  A 65 year old woman with significant family history for colon  cancer, history of bleeding hemorrhoids, intermittent rectal bleeding,  chronic GERD.   First she has a family history of colon cancer and she should undergo full  colonoscopy at her soonest convenience. Dr. Orson Slick has been helping her with  her hemorrhoids, last done, I think, 2 years ago. Her current rectal  bleeding likely is hemorrhoidal from what she says. I will get a CBC to  check to see whether she is anemic or not but she does not appear to be  clinically. I have asked her to get back in touch with Dr. Orson Slick to discuss  further hemorrhoid treatments as I do not treat hemorrhoids. At that same  time as colonoscopy, she should undergo full EGD. She has chronic GERD  symptoms and should screen her for Barrett's or other acid related  complications. She is not taking Protonix at the correct  time of day in  relation to food so I have recommended she begin taking it 20-30 minutes  before breakfast as well as 20-30 minutes before dinner. Lastly, she has  gained 50 pounds in the last year and I think this is at least  contributing significantly to her abdominal discomfort and her GERD and so I  stress weight reduction.    ______________________________  Rachael Fee, MD    DPJ/MedQ  DD: 10/18/2006  DT: 10/19/2006  Job #: [id number]   cc:   Jarome Matin, M.D.

## 2011-05-03 ENCOUNTER — Encounter: Payer: Self-pay | Admitting: Internal Medicine

## 2011-05-03 NOTE — Assessment & Plan Note (Signed)
She is doing well on Crestor.

## 2011-05-03 NOTE — Assessment & Plan Note (Signed)
I will check her TSH today 

## 2011-05-03 NOTE — Assessment & Plan Note (Signed)
She will d'c OT for the next two weeks and will try Celebrex for pain. IF that does not help then she will be referred to a hand surgeon.

## 2011-05-03 NOTE — Assessment & Plan Note (Signed)
Her BP is well controlled, today I will check her lytes and renal function 

## 2011-05-03 NOTE — Assessment & Plan Note (Signed)
Will continue actos for this and will check her LFT's today, she will work of lifestyle issues as well

## 2011-05-03 NOTE — Assessment & Plan Note (Signed)
She tells me that she has had some blood sugar "spikes" so I will check her A1C and see what her overall average is and will adjust meds if needed, she made a commitment today to do better with lifestyle measures

## 2011-05-03 NOTE — Progress Notes (Signed)
Subjective:    Patient ID: Valerie [name], female    DOB: 08-12-1946, 65 y.o.   MRN: [id number]  Hypertension This is a chronic problem. The current episode started more than 1 year ago. The problem has been gradually improving since onset. The problem is controlled. Pertinent negatives include no anxiety, blurred vision, chest pain, headaches, malaise/fatigue, neck pain, orthopnea, palpitations, peripheral edema, PND, shortness of breath or sweats. There are no associated agents to hypertension. Risk factors for coronary artery disease include no known risk factors. Past treatments include angiotensin blockers, beta blockers and calcium channel blockers. The current treatment provides significant improvement. There are no compliance problems.   Diabetes She presents for her follow-up diabetic visit. She has type 2 diabetes mellitus. Her disease course has been worsening. Pertinent negatives for hypoglycemia include no confusion, dizziness, headaches, hunger, mood changes, nervousness/anxiousness, pallor, seizures, sleepiness, speech difficulty, sweats or tremors. Associated symptoms include polyphagia. Pertinent negatives for diabetes include no blurred vision, no chest pain, no fatigue, no foot paresthesias, no foot ulcerations, no polydipsia, no polyuria, no visual change, no weakness and no weight loss. There are no hypoglycemic complications. Symptoms are stable. There are no diabetic complications. Current diabetic treatment includes oral agent (monotherapy). She is compliant with treatment all of the time. Her weight is increasing steadily. She is following a generally unhealthy diet. When asked about meal planning, she reported none. She has had a previous visit with a dietician. She never participates in exercise. Her home blood glucose trend is increasing steadily. Her breakfast blood glucose range is generally 130-140 mg/dl. Her lunch blood glucose range is generally 110-130 mg/dl. Her dinner  blood glucose range is generally 130-140 mg/dl. Her highest blood glucose is 130-140 mg/dl. Her overall blood glucose range is 130-140 mg/dl. An ACE inhibitor/angiotensin II receptor blocker is being taken. She does not see a podiatrist.Eye exam is current.   She continues to have trouble with her left wrist. She wears a brace and has a note today from OT that states she has not improved much despite iontophoresis, splinting, AROM exercises, and immobilization.   Review of Systems  Constitutional: Negative for fever, chills, weight loss, malaise/fatigue, diaphoresis, activity change, appetite change, fatigue and unexpected weight change.  HENT: Negative for facial swelling, neck pain and neck stiffness.   Eyes: Negative for blurred vision, photophobia and visual disturbance.  Respiratory: Negative for apnea, cough, choking, chest tightness, shortness of breath, wheezing and stridor.   Cardiovascular: Negative for chest pain, palpitations, orthopnea, leg swelling and PND.  Gastrointestinal: Negative for nausea, vomiting, abdominal pain, diarrhea, constipation, blood in stool and abdominal distention.  Genitourinary: Negative for dysuria, urgency, polyuria, frequency, hematuria, flank pain, decreased urine volume, enuresis and difficulty urinating.  Musculoskeletal: Negative for myalgias, back pain, joint swelling, arthralgias and gait problem.  Skin: Negative for color change, pallor and rash.  Neurological: Negative for dizziness, tremors, seizures, syncope, facial asymmetry, speech difficulty, weakness, light-headedness, numbness and headaches.  Hematological: Positive for polyphagia. Negative for polydipsia and adenopathy. Does not bruise/bleed easily.  Psychiatric/Behavioral: Positive for dysphoric mood (she remains depressed, Dr. Evelene Croon has prescribed Luvox but she has not started  it yet). Negative for suicidal ideas, hallucinations, behavioral problems, confusion, sleep disturbance, self-injury,  decreased concentration and agitation. The patient is not nervous/anxious and is not hyperactive.        Objective:   Physical Exam  Vitals reviewed. Constitutional: She is oriented to person, place, and time. She appears well-developed and well-nourished. No distress.  HENT:  Head: Normocephalic and atraumatic.  Right Ear: External ear normal.  Left Ear: External ear normal.  Nose: Nose normal.  Mouth/Throat: Oropharynx is clear and moist. No oropharyngeal exudate.  Eyes: Conjunctivae and EOM are normal. Pupils are equal, round, and reactive to light. Right eye exhibits no discharge. Left eye exhibits no discharge. No scleral icterus.  Neck: Normal range of motion. Neck supple. No JVD present. No tracheal deviation present. No thyromegaly present.  Cardiovascular: Normal rate, normal heart sounds and intact distal pulses.  Exam reveals no gallop and no friction rub.   No murmur heard. Pulmonary/Chest: Effort normal and breath sounds normal. No stridor. No respiratory distress. She has no wheezes. She has no rales. She exhibits no tenderness.  Abdominal: Soft. Bowel sounds are normal. She exhibits no distension and no mass. There is no tenderness. There is no rebound and no guarding.  Musculoskeletal: Normal range of motion. She exhibits no edema and no tenderness.  Lymphadenopathy:    She has no cervical adenopathy.  Neurological: She is alert and oriented to person, place, and time. She has normal reflexes. She displays normal reflexes. No cranial nerve deficit. She exhibits normal muscle tone. Coordination normal.  Skin: Skin is warm and dry. No rash noted. She is not diaphoretic. No erythema. No pallor.  Psychiatric: She has a normal mood and affect. Her behavior is normal. Judgment and thought content normal.        Lab Results  Component Value Date   WBC 10.3 04/24/2011   HGB 9.5* 04/24/2011   HCT 29.2* 04/24/2011   PLT 366.0 04/24/2011   CHOL 121 02/05/2011   TRIG 219.0*  02/05/2011   HDL 37.90* 02/05/2011   LDLDIRECT 54.1 02/05/2011   ALT 29 04/24/2011   AST 32 04/24/2011   NA 142 04/24/2011   K 4.0 04/24/2011   CL 106 04/24/2011   CREATININE 0.8 04/24/2011   BUN 10 04/24/2011   CO2 28 04/24/2011   TSH 2.05 04/24/2011   INR 0.97 07/12/2010   HGBA1C 6.6* 04/24/2011   MICROALBUR 3.2* 10/17/2010    Assessment & Plan:

## 2011-05-06 ENCOUNTER — Inpatient Hospital Stay (INDEPENDENT_AMBULATORY_CARE_PROVIDER_SITE_OTHER)
Admission: RE | Admit: 2011-05-06 | Discharge: 2011-05-06 | Disposition: A | Discharge status: Home / Self Care | Payer: Medicare PPO | Source: Ambulatory Visit | Attending: Family Medicine | Admitting: Family Medicine

## 2011-05-06 ENCOUNTER — Encounter: Payer: Medicare PPO | Admitting: Occupational Therapy

## 2011-05-06 DIAGNOSIS — T23029A Burn of unspecified degree of unspecified single finger (nail) except thumb, initial encounter: Secondary | ICD-10-CM

## 2011-05-08 ENCOUNTER — Encounter: Payer: Medicare PPO | Admitting: Occupational Therapy

## 2011-05-08 ENCOUNTER — Inpatient Hospital Stay (HOSPITAL_COMMUNITY)
Admission: RE | Admit: 2011-05-08 | Discharge: 2011-05-08 | Disposition: A | Discharge status: Home / Self Care | Payer: Medicare PPO | Source: Ambulatory Visit | Attending: Family Medicine | Admitting: Family Medicine

## 2011-05-18 ENCOUNTER — Ambulatory Visit (INDEPENDENT_AMBULATORY_CARE_PROVIDER_SITE_OTHER): Payer: Medicare PPO | Admitting: Internal Medicine

## 2011-05-18 ENCOUNTER — Encounter: Payer: Self-pay | Admitting: Internal Medicine

## 2011-05-18 VITALS — BP 136/82 | HR 80 | Temp 97.0°F | Resp 16 | Wt 249.0 lb

## 2011-05-18 DIAGNOSIS — E039 Hypothyroidism, unspecified: Secondary | ICD-10-CM

## 2011-05-18 DIAGNOSIS — K7689 Other specified diseases of liver: Secondary | ICD-10-CM

## 2011-05-18 DIAGNOSIS — M654 Radial styloid tenosynovitis [de Quervain]: Secondary | ICD-10-CM

## 2011-05-18 DIAGNOSIS — I10 Essential (primary) hypertension: Secondary | ICD-10-CM

## 2011-05-18 DIAGNOSIS — E119 Type 2 diabetes mellitus without complications: Secondary | ICD-10-CM

## 2011-05-18 NOTE — Progress Notes (Signed)
Subjective:    Patient ID: Valerie [name], female    DOB: [birth year], 65 y.o.   MRN: [id number]  HPI She returns for f/up and has a healing burn on her right middle finger/volar side that occurred 2 weeks ago. She thinks that it is healing well. She has persistent left wrist pain despite PT, splinting, and celebrex.   Review of Systems  Constitutional: Negative for fever, chills, diaphoresis, activity change, appetite change, fatigue and unexpected weight change.  HENT: Negative for facial swelling, neck pain and neck stiffness.   Eyes: Negative for photophobia and visual disturbance.  Respiratory: Negative for cough, choking, chest tightness, shortness of breath, wheezing and stridor.   Cardiovascular: Negative for chest pain, palpitations and leg swelling.  Gastrointestinal: Negative for nausea, vomiting, abdominal pain, diarrhea, constipation, abdominal distention and anal bleeding.  Genitourinary: Negative for dysuria, urgency, frequency, hematuria, flank pain, enuresis and difficulty urinating.  Musculoskeletal: Positive for arthralgias (left wrist). Negative for myalgias, back pain, joint swelling and gait problem.  Skin: Negative for color change, pallor and rash.  Neurological: Negative for dizziness, tremors, seizures, syncope, facial asymmetry, speech difficulty, weakness, light-headedness, numbness and headaches.  Hematological: Negative for adenopathy. Does not bruise/bleed easily.  Psychiatric/Behavioral: Negative.        Objective:   Physical Exam  Constitutional: She is oriented to person, place, and time. She appears well-developed and well-nourished. No distress.  HENT:  Head: Normocephalic and atraumatic.  Right Ear: External ear normal.  Left Ear: External ear normal.  Nose: Nose normal.  Mouth/Throat: Oropharynx is clear and moist. No oropharyngeal exudate.  Eyes: Conjunctivae and EOM are normal. Pupils are equal, round, and reactive to light. Right eye exhibits  no discharge. Left eye exhibits no discharge. No scleral icterus.  Neck: Normal range of motion. Neck supple. No JVD present. No tracheal deviation present. No thyromegaly present.  Cardiovascular: Normal rate, regular rhythm, normal heart sounds and intact distal pulses.  Exam reveals no gallop and no friction rub.   No murmur heard. Pulmonary/Chest: Effort normal and breath sounds normal. No stridor. No respiratory distress. She has no wheezes. She has no rales. She exhibits no tenderness.  Abdominal: Soft. Bowel sounds are normal. She exhibits no distension and no mass. There is no tenderness. There is no rebound and no guarding.  Musculoskeletal: Normal range of motion. She exhibits no edema and no tenderness.  Lymphadenopathy:    She has no cervical adenopathy.  Neurological: She is alert and oriented to person, place, and time. She has normal reflexes. She displays normal reflexes. No cranial nerve deficit. She exhibits normal muscle tone. Coordination normal.  Skin: Skin is warm and dry. Burn noted. No abrasion, no bruising, no ecchymosis, no laceration, no lesion, no petechiae, no purpura and no rash noted. Rash is not macular, not papular, not maculopapular, not nodular, not pustular, not vesicular and not urticarial. She is not diaphoretic. No erythema. No pallor.       Burn on right middle finger is healing well with no evidence of infection  Psychiatric: She has a normal mood and affect. Her behavior is normal. Judgment and thought content normal.        Lab Results  Component Value Date   WBC 10.3 04/24/2011   HGB 9.5* 04/24/2011   HCT 29.2* 04/24/2011   PLT 366.0 04/24/2011   CHOL 121 02/05/2011   TRIG 219.0* 02/05/2011   HDL 37.90* 02/05/2011   LDLDIRECT 54.1 02/05/2011   ALT 29 04/24/2011   AST  32 04/24/2011   NA 142 04/24/2011   K 4.0 04/24/2011   CL 106 04/24/2011   CREATININE 0.8 04/24/2011   BUN 10 04/24/2011   CO2 28 04/24/2011   TSH 2.05 04/24/2011   INR 0.97 07/12/2010    HGBA1C 6.6* 04/24/2011   MICROALBUR 3.2* 10/17/2010    Assessment & Plan:

## 2011-05-18 NOTE — Assessment & Plan Note (Signed)
Her blood sugars are well controlled 

## 2011-05-18 NOTE — Assessment & Plan Note (Signed)
Her BP is well controlled 

## 2011-05-18 NOTE — Assessment & Plan Note (Signed)
Referral to hand surgery

## 2011-05-18 NOTE — Patient Instructions (Signed)
Diabetes, Type 2 Diabetes is a lasting (chronic) disease. In type 2 diabetes, the pancreas does not make enough insulin (a hormone), and the body does not respond normally to the insulin that is made. This type of diabetes was also previously called adult onset diabetes. About 90% of all those who have diabetes have type 2. It usually occurs after the age of 40 but can occur at any age. CAUSES Unlike type 1 diabetes, which happens because insulin is no longer being made, type 2 diabetes happens because the body is making less insulin and has trouble using the insulin properly. SYMPTOMS  Drinking more than usual.   Urinating more than usual.   Blurred vision.   Dry, itchy skin.   Frequent infection like yeast infections in women.   More tired than usual (fatigue).  TREATMENT  Healthy eating.   Exercise.   Medication, if needed.   Monitoring blood glucose (sugar).   Seeing your caregiver regularly.  HOME CARE INSTRUCTIONS  Check your blood glucose (sugar) at least once daily. More frequent monitoring may be necessary, depending on your medications and on how well your diabetes is controlled. Your caregiver will advise you.   Take your medicine as directed by your caregiver.   Do not smoke.   Make wise food choices. Ask your caregiver for information. Weight loss can improve your diabetes.   Learn about low blood glucose (hypoglycemia) and how to treat it.   Get your eyes checked regularly.   Have a yearly physical exam. Have your blood pressure checked. Get your blood and urine tested.   Wear a pendant or bracelet saying that you have diabetes.   Check your feet every night for sores. Let your caregiver know if you have sores that are not healing.  SEEK MEDICAL CARE IF:  You are having problems keeping your blood glucose at target range.   You feel you might be having problems with your medicines.   You have symptoms of an illness that is not improving after 24  hours.   You have a sore or wound that is not healing.   You notice a change in vision or a new problem with your vision.   You develop a fever of more than 100.5.  Document Released: 11/30/2005 Document Re-Released: 12/22/2009 [employer] Patient Information 2011 [employer]. 

## 2011-05-18 NOTE — Assessment & Plan Note (Signed)
She is euthyroid.

## 2011-05-18 NOTE — Assessment & Plan Note (Signed)
This has improved.

## 2011-06-01 ENCOUNTER — Telehealth: Payer: Self-pay | Admitting: *Deleted

## 2011-06-01 NOTE — Telephone Encounter (Signed)
Per the request of Helmut Muster at [facility]', faxed last OV notes, Last labs, and hospital notes for Pt. Results from 'nerve conduction testing' also requested, but no information in system [multiple 'canceled' appts to neuro]. Gave contact number on outgoing fax to reach medical records.

## 2011-06-22 ENCOUNTER — Ambulatory Visit: Payer: Medicare PPO | Admitting: Internal Medicine

## 2011-09-21 ENCOUNTER — Encounter: Payer: Self-pay | Admitting: Internal Medicine

## 2011-09-21 ENCOUNTER — Ambulatory Visit (INDEPENDENT_AMBULATORY_CARE_PROVIDER_SITE_OTHER): Payer: Medicare PPO | Admitting: Internal Medicine

## 2011-09-21 ENCOUNTER — Other Ambulatory Visit (INDEPENDENT_AMBULATORY_CARE_PROVIDER_SITE_OTHER): Payer: Medicare PPO

## 2011-09-21 VITALS — BP 128/80 | HR 76 | Temp 97.7°F | Resp 16 | Wt 284.0 lb

## 2011-09-21 DIAGNOSIS — D649 Anemia, unspecified: Secondary | ICD-10-CM

## 2011-09-21 DIAGNOSIS — E119 Type 2 diabetes mellitus without complications: Secondary | ICD-10-CM

## 2011-09-21 DIAGNOSIS — Z23 Encounter for immunization: Secondary | ICD-10-CM

## 2011-09-21 DIAGNOSIS — I10 Essential (primary) hypertension: Secondary | ICD-10-CM

## 2011-09-21 DIAGNOSIS — E039 Hypothyroidism, unspecified: Secondary | ICD-10-CM

## 2011-09-21 DIAGNOSIS — J45909 Unspecified asthma, uncomplicated: Secondary | ICD-10-CM

## 2011-09-21 DIAGNOSIS — J069 Acute upper respiratory infection, unspecified: Secondary | ICD-10-CM | POA: Insufficient documentation

## 2011-09-21 DIAGNOSIS — J45901 Unspecified asthma with (acute) exacerbation: Secondary | ICD-10-CM

## 2011-09-21 LAB — URINALYSIS, ROUTINE W REFLEX MICROSCOPIC
Bilirubin Urine: NEGATIVE
Hgb urine dipstick: NEGATIVE
Ketones, ur: NEGATIVE
Leukocytes, UA: NEGATIVE
Nitrite: NEGATIVE
Specific Gravity, Urine: 1.015 (ref 1.000–1.030)
Total Protein, Urine: NEGATIVE
Urine Glucose: NEGATIVE
Urobilinogen, UA: 0.2 (ref 0.0–1.0)
pH: 6.5 (ref 5.0–8.0)

## 2011-09-21 LAB — CBC WITH DIFFERENTIAL/PLATELET
Basophils Absolute: 0.1 10*3/uL (ref 0.0–0.1)
Basophils Relative: 0.5 % (ref 0.0–3.0)
Eosinophils Absolute: 0.4 10*3/uL (ref 0.0–0.7)
Eosinophils Relative: 3.3 % (ref 0.0–5.0)
HCT: 28.1 % — ABNORMAL LOW (ref 36.0–46.0)
Hemoglobin: 9.1 g/dL — ABNORMAL LOW (ref 12.0–15.0)
Lymphocytes Relative: 22.9 % (ref 12.0–46.0)
Lymphs Abs: 2.7 10*3/uL (ref 0.7–4.0)
MCHC: 32.4 g/dL (ref 30.0–36.0)
MCV: 77.4 fl — ABNORMAL LOW (ref 78.0–100.0)
Monocytes Absolute: 1.2 10*3/uL — ABNORMAL HIGH (ref 0.1–1.0)
Monocytes Relative: 10.2 % (ref 3.0–12.0)
Neutro Abs: 7.4 10*3/uL (ref 1.4–7.7)
Neutrophils Relative %: 63.1 % (ref 43.0–77.0)
Platelets: 380 10*3/uL (ref 150.0–400.0)
RBC: 3.63 Mil/uL — ABNORMAL LOW (ref 3.87–5.11)
RDW: 18.9 % — ABNORMAL HIGH (ref 11.5–14.6)
WBC: 11.7 10*3/uL — ABNORMAL HIGH (ref 4.5–10.5)

## 2011-09-21 LAB — COMPREHENSIVE METABOLIC PANEL
ALT: 31 U/L (ref 0–35)
AST: 37 U/L (ref 0–37)
Albumin: 4 g/dL (ref 3.5–5.2)
Alkaline Phosphatase: 92 U/L (ref 39–117)
BUN: 12 mg/dL (ref 6–23)
CO2: 28 mEq/L (ref 19–32)
Calcium: 8.8 mg/dL (ref 8.4–10.5)
Chloride: 105 mEq/L (ref 96–112)
Creatinine, Ser: 0.9 mg/dL (ref 0.4–1.2)
GFR: 81.19 mL/min (ref >60.00)
Glucose, Bld: 100 mg/dL — ABNORMAL HIGH (ref 70–99)
Potassium: 4.2 mEq/L (ref 3.5–5.1)
Sodium: 141 mEq/L (ref 135–145)
Total Bilirubin: 0.4 mg/dL (ref 0.3–1.2)
Total Protein: 7.8 g/dL (ref 6.0–8.3)

## 2011-09-21 LAB — HEMOGLOBIN A1C: Hgb A1c MFr Bld: 7.3 % — ABNORMAL HIGH (ref 4.6–6.5)

## 2011-09-21 LAB — TSH: TSH: 2.2 u[IU]/mL (ref 0.35–5.50)

## 2011-09-21 MED ORDER — PSEUDOEPH-CHLORPHEN-HYDROCOD 60-4-5 MG/5ML PO SOLN: 5.0000 mL | Freq: Four times a day (QID) | ORAL | Status: DC | PRN

## 2011-09-21 MED ORDER — PIOGLITAZONE HCL 30 MG PO TABS: 30.0000 mg | ORAL_TABLET | Freq: Every day | ORAL | Status: DC

## 2011-09-21 MED ORDER — METHYLPREDNISOLONE ACETATE 80 MG/ML IJ SUSP
120.0000 mg | Freq: Once | INTRAMUSCULAR | Status: AC
  Administered 2011-09-21: 120 mg via INTRAMUSCULAR

## 2011-09-21 MED ORDER — MOMETASONE FURO-FORMOTEROL FUM 200-5 MCG/ACT IN AERO: 2.0000 | INHALATION_SPRAY | Freq: Two times a day (BID) | RESPIRATORY_TRACT | Status: DC

## 2011-09-21 NOTE — Assessment & Plan Note (Signed)
I will check her A1C and will monitor her renal function 

## 2011-09-21 NOTE — Assessment & Plan Note (Signed)
I will recheck her CBC 

## 2011-09-21 NOTE — Assessment & Plan Note (Signed)
Start dulera 

## 2011-09-21 NOTE — Progress Notes (Signed)
Subjective:    Patient ID: Valerie [name], female    DOB: [birth year], 65 y.o.   MRN: [id number]  URI  This is a new problem. Episode onset: 3 days ago. The problem has been unchanged. There has been no fever. Associated symptoms include congestion, coughing (nonproductive), rhinorrhea, a sore throat and wheezing. Pertinent negatives include no abdominal pain, chest pain, diarrhea, dysuria, ear pain, headaches, joint pain, joint swelling, nausea, neck pain, plugged ear sensation, rash, sinus pain, sneezing, swollen glands or vomiting. She has tried inhaler use for the symptoms. The treatment provided mild relief.  Diabetes She presents for her follow-up diabetic visit. She has type 2 diabetes mellitus. Her disease course has been stable. There are no hypoglycemic associated symptoms. Pertinent negatives for hypoglycemia include no confusion, dizziness, headaches, nervousness/anxiousness, pallor, seizures, speech difficulty or tremors. Pertinent negatives for diabetes include no blurred vision, no chest pain, no fatigue, no foot paresthesias, no foot ulcerations, no polydipsia, no polyphagia, no polyuria, no visual change, no weakness and no weight loss. There are no hypoglycemic complications. Symptoms are stable. There are no diabetic complications. Current diabetic treatment includes oral agent (monotherapy). She is compliant with treatment most of the time. Her weight is increasing steadily. She is following a generally healthy diet. Meal planning includes avoidance of concentrated sweets. She has not had a previous visit with a dietician. She never participates in exercise. There is no change in her home blood glucose trend. An ACE inhibitor/angiotensin II receptor blocker is being taken. She does not see a podiatrist.Eye exam is current.      Review of Systems  Constitutional: Positive for unexpected weight change (weight gain). Negative for fever, chills, weight loss, diaphoresis, activity change,  appetite change and fatigue.  HENT: Positive for congestion, sore throat, rhinorrhea and postnasal drip. Negative for hearing loss, ear pain, nosebleeds, facial swelling, sneezing, neck pain, neck stiffness, voice change, sinus pressure, tinnitus and ear discharge.   Eyes: Negative.  Negative for blurred vision.  Respiratory: Positive for cough (nonproductive), shortness of breath and wheezing. Negative for apnea, choking, chest tightness and stridor.   Cardiovascular: Negative for chest pain, palpitations and leg swelling.  Gastrointestinal: Negative for nausea, vomiting, abdominal pain, diarrhea, constipation, blood in stool and anal bleeding.  Genitourinary: Negative for dysuria, urgency, polyuria, frequency, hematuria, flank pain, decreased urine volume, enuresis, difficulty urinating and dyspareunia.  Musculoskeletal: Negative for myalgias, back pain, joint pain, joint swelling, arthralgias and gait problem.  Skin: Negative for color change, pallor, rash and wound.  Neurological: Negative for dizziness, tremors, seizures, syncope, facial asymmetry, speech difficulty, weakness, light-headedness, numbness and headaches.  Hematological: Negative for polydipsia, polyphagia and adenopathy. Does not bruise/bleed easily.  Psychiatric/Behavioral: Negative for suicidal ideas, hallucinations, behavioral problems, confusion, sleep disturbance, self-injury, dysphoric mood, decreased concentration and agitation. The patient is not nervous/anxious and is not hyperactive.        Objective:   Physical Exam  Vitals reviewed. Constitutional: She is oriented to person, place, and time. She appears well-developed and well-nourished. No distress.  HENT:  Head: Normocephalic and atraumatic.  Mouth/Throat: Oropharynx is clear and moist. No oropharyngeal exudate.  Eyes: Conjunctivae are normal. Right eye exhibits no discharge. Left eye exhibits no discharge. No scleral icterus.  Neck: Normal range of motion.  Neck supple. No JVD present. No tracheal deviation present. No thyromegaly present.  Cardiovascular: Normal rate, regular rhythm, normal heart sounds and intact distal pulses.  Exam reveals no gallop and no friction rub.   No murmur heard.  Pulmonary/Chest: Effort normal. No accessory muscle usage or stridor. Not tachypneic. No respiratory distress. She has no decreased breath sounds. She has wheezes in the right upper field and the left upper field. She has no rhonchi. She has no rales. She exhibits no tenderness.  Abdominal: Soft. Bowel sounds are normal. She exhibits no distension and no mass. There is no tenderness. There is no rebound and no guarding.  Musculoskeletal: Normal range of motion. She exhibits no edema and no tenderness.  Lymphadenopathy:    She has no cervical adenopathy.  Neurological: She is oriented to person, place, and time. She displays normal reflexes. No cranial nerve deficit. She exhibits normal muscle tone. Coordination normal.  Skin: Skin is warm and dry. No rash noted. She is not diaphoretic. No erythema. No pallor.  Psychiatric: She has a normal mood and affect. Her behavior is normal. Judgment and thought content normal.      Lab Results  Component Value Date   WBC 10.3 04/24/2011   HGB 9.5* 04/24/2011   HCT 29.2* 04/24/2011   PLT 366.0 04/24/2011   GLUCOSE 101* 04/24/2011   CHOL 121 02/05/2011   TRIG 219.0* 02/05/2011   HDL 37.90* 02/05/2011   LDLDIRECT 54.1 02/05/2011   ALT 29 04/24/2011   AST 32 04/24/2011   NA 142 04/24/2011   K 4.0 04/24/2011   CL 106 04/24/2011   CREATININE 0.8 04/24/2011   BUN 10 04/24/2011   CO2 28 04/24/2011   TSH 2.05 04/24/2011   INR 0.97 07/12/2010   HGBA1C 6.6* 04/24/2011   MICROALBUR 3.2* 10/17/2010      Assessment & Plan:

## 2011-09-21 NOTE — Assessment & Plan Note (Signed)
She was given a dose of depo-medrol IM to prevent any further complications from the wheezing

## 2011-09-21 NOTE — Patient Instructions (Signed)
Upper Respiratory Infection (URI), Adult An upper respiratory infection (URI) is also known as the common cold. It is often caused by a virus. Colds are easily spread (contagious). You can pass it to others by touch or by drinking out of the same glass. You may have:  A runny nose.  Sneezing.   Coughing.   A stuffy nose (nasal congestion).  A sinus infection.   A sore throat.  A scratchy voice (hoarseness).   You can also have:  Tiredness (fatigue).  Muscle aches.   A headache.  A mild fever.   Usually, you get well in a week or two. Your doctor will know if you have a URI by talking to you and examining you. HOME CARE  Inhale heated mist or steam (vaporizer or shower).   Sip chicken soup.   Get plenty of rest.   Use lozenges for throat comfort.   Rinse your mouth (gargle) with warm water or salt water (1/4 teaspoon salt in 8 ounces of water).   Only take medicine as told by your doctor.   Drink enough water and fluids to keep your pee (urine) clear or pale yellow.   Rest as needed.   Return to work when your temperature has returned to normal or as told by your doctor. Use a face mask and wash your hands to stop your cold from spreading.  GET HELP IF:  You have a temperature by mouth above 100.5.   After the first few days, you feel you are getting worse, not better.   You have questions about your medicine.  GET HELP RIGHT AWAY IF:  You have a temperature by mouth above 100.5, not controlled by medicine.   You have a bad or lasting headache, ear pain, sinus pain, or chest pain.   You have trouble breathing or get short of breath.   You have a lasting cough, cough up blood, or have a change in your usual mucus.   You have sore muscles, a stiff neck, or a very bad headache, not controlled with medicine.  MAKE SURE YOU:  Understand these instructions.   Will watch your condition.   Will get help right away if you are not doing well or get worse.    Document Released: 05/18/2008 Document Re-Released: 02/24/2010 [employer] Patient Information 2011 [employer]. 

## 2011-09-21 NOTE — Assessment & Plan Note (Signed)
I will check her TSH today 

## 2011-09-21 NOTE — Assessment & Plan Note (Signed)
This is viral so I gave her samples of zutripro for symptom relief

## 2011-09-21 NOTE — Assessment & Plan Note (Signed)
Her BP is well controlled 

## 2011-10-22 ENCOUNTER — Ambulatory Visit (INDEPENDENT_AMBULATORY_CARE_PROVIDER_SITE_OTHER)
Admission: RE | Admit: 2011-10-22 | Discharge: 2011-10-22 | Disposition: A | Discharge status: Home / Self Care | Payer: Medicare PPO | Source: Ambulatory Visit | Attending: Internal Medicine | Admitting: Internal Medicine

## 2011-10-22 ENCOUNTER — Ambulatory Visit (INDEPENDENT_AMBULATORY_CARE_PROVIDER_SITE_OTHER): Payer: Medicare PPO | Admitting: Internal Medicine

## 2011-10-22 ENCOUNTER — Encounter: Payer: Self-pay | Admitting: Neurology

## 2011-10-22 ENCOUNTER — Encounter: Payer: Self-pay | Admitting: Internal Medicine

## 2011-10-22 ENCOUNTER — Other Ambulatory Visit (INDEPENDENT_AMBULATORY_CARE_PROVIDER_SITE_OTHER): Payer: Medicare PPO

## 2011-10-22 VITALS — BP 128/70 | HR 82 | Temp 98.5°F | Resp 16 | Wt 274.5 lb

## 2011-10-22 DIAGNOSIS — R059 Cough, unspecified: Secondary | ICD-10-CM

## 2011-10-22 DIAGNOSIS — R413 Other amnesia: Secondary | ICD-10-CM | POA: Insufficient documentation

## 2011-10-22 DIAGNOSIS — M545 Low back pain, unspecified: Secondary | ICD-10-CM

## 2011-10-22 DIAGNOSIS — D649 Anemia, unspecified: Secondary | ICD-10-CM

## 2011-10-22 DIAGNOSIS — R05 Cough: Secondary | ICD-10-CM

## 2011-10-22 DIAGNOSIS — J069 Acute upper respiratory infection, unspecified: Secondary | ICD-10-CM

## 2011-10-22 DIAGNOSIS — J45909 Unspecified asthma, uncomplicated: Secondary | ICD-10-CM

## 2011-10-22 LAB — CBC WITH DIFFERENTIAL/PLATELET
Basophils Absolute: 0.1 10*3/uL (ref 0.0–0.1)
Basophils Relative: 0.4 % (ref 0.0–3.0)
Eosinophils Absolute: 0.1 10*3/uL (ref 0.0–0.7)
Eosinophils Relative: 0.5 % (ref 0.0–5.0)
HCT: 31.5 % — ABNORMAL LOW (ref 36.0–46.0)
Hemoglobin: 10.2 g/dL — ABNORMAL LOW (ref 12.0–15.0)
Lymphocytes Relative: 16 % (ref 12.0–46.0)
Lymphs Abs: 2.5 10*3/uL (ref 0.7–4.0)
MCHC: 32.5 g/dL (ref 30.0–36.0)
MCV: 78.5 fl (ref 78.0–100.0)
Monocytes Absolute: 1.4 10*3/uL — ABNORMAL HIGH (ref 0.1–1.0)
Monocytes Relative: 8.9 % (ref 3.0–12.0)
Neutro Abs: 11.4 10*3/uL — ABNORMAL HIGH (ref 1.4–7.7)
Neutrophils Relative %: 74.2 % (ref 43.0–77.0)
Platelets: 362 10*3/uL (ref 150.0–400.0)
RBC: 4.01 Mil/uL (ref 3.87–5.11)
RDW: 21 % — ABNORMAL HIGH (ref 11.5–14.6)
WBC: 15.4 10*3/uL — ABNORMAL HIGH (ref 4.5–10.5)

## 2011-10-22 MED ORDER — HYDROCODONE-IBUPROFEN 10-200 MG PO TABS: 1.0000 | ORAL_TABLET | Freq: Three times a day (TID) | ORAL | Status: DC | PRN

## 2011-10-22 NOTE — Assessment & Plan Note (Signed)
Check a CXR for pna, edema, mass

## 2011-10-22 NOTE — Patient Instructions (Signed)
Back Pain, Adult Low back pain is very common. About 1 in 5 people have back pain.The cause of low back pain is rarely dangerous. The pain often gets better over time.About half of people with a sudden onset of back pain feel better in just 2 weeks. About 8 in 10 people feel better by 6 weeks.  CAUSES Some common causes of back pain include:  Strain of the muscles or ligaments supporting the spine.   Wear and tear (degeneration) of the spinal discs.   Arthritis.   Direct injury to the back.  DIAGNOSIS Most of the time, the direct cause of low back pain is not known.However, back pain can be treated effectively even when the exact cause of the pain is unknown.Answering your caregiver's questions about your overall health and symptoms is one of the most accurate ways to make sure the cause of your pain is not dangerous. If your caregiver needs more information, he or she may order lab work or imaging tests (X-rays or MRIs).However, even if imaging tests show changes in your back, this usually does not require surgery. HOME CARE INSTRUCTIONS For many people, back pain returns.Since low back pain is rarely dangerous, it is often a condition that people can learn to manageon their own.   Remain active. It is stressful on the back to sit or stand in one place. Do not sit, drive, or stand in one place for more than 30 minutes at a time. Take short walks on level surfaces as soon as pain allows.Try to increase the length of time you walk each day.   Do not stay in bed.Resting more than 1 or 2 days can delay your recovery.   Do not avoid exercise or work.Your body is made to move.It is not dangerous to be active, even though your back may hurt.Your back will likely heal faster if you return to being active before your pain is gone.   Pay attention to your body when you bend and lift. Many people have less discomfortwhen lifting if they bend their knees, keep the load close to their  bodies,and avoid twisting. Often, the most comfortable positions are those that put less stress on your recovering back.   Find a comfortable position to sleep. Use a firm mattress and lie on your side with your knees slightly bent. If you lie on your back, put a pillow under your knees.   Only take over-the-counter or prescription medicines as directed by your caregiver. Over-the-counter medicines to reduce pain and inflammation are often the most helpful.Your caregiver may prescribe muscle relaxant drugs.These medicines help dull your pain so you can more quickly return to your normal activities and healthy exercise.   Put ice on the injured area.   Put ice in a plastic bag.   Place a towel between your skin and the bag.   Leave the ice on for 15 to 20 minutes, 3 to 4 times a day for the first 2 to 3 days. After that, ice and heat may be alternated to reduce pain and spasms.   Ask your caregiver about trying back exercises and gentle massage. This may be of some benefit.   Avoid feeling anxious or stressed.Stress increases muscle tension and can worsen back pain.It is important to recognize when you are anxious or stressed and learn ways to manage it.Exercise is a great option.  SEEK MEDICAL CARE IF:  You have pain that is not relieved with rest or medicine.   You have   pain that does not improve in 1 week.   You have new symptoms.   You are generally not feeling well.  SEEK IMMEDIATE MEDICAL CARE IF:   You have pain that radiates from your back into your legs.   You develop new bowel or bladder control problems.   You have unusual weakness or numbness in your arms or legs.   You develop nausea or vomiting.   You develop abdominal pain.   You feel faint.  Document Released: 11/30/2005 Document Revised: 08/12/2011 Document Reviewed: 04/20/2011 [employer] Patient Information 2012 [employer]. 

## 2011-10-22 NOTE — Assessment & Plan Note (Signed)
She fell 3 weeks ago and that precipitated the LBP so I will check a plain film today and will start pain meds for her

## 2011-10-22 NOTE — Assessment & Plan Note (Signed)
Neurology referral to check for s/s of dementia

## 2011-10-22 NOTE — Assessment & Plan Note (Signed)
This appears to be well controlled.

## 2011-10-22 NOTE — Progress Notes (Signed)
Subjective:    Patient ID: Valerie [name], female    DOB: Jan 14, 1946, 65 y.o.   MRN: [id number]  Cough This is a recurrent problem. The current episode started 1 to 4 weeks ago. The problem has been unchanged. The problem occurs every few hours. The cough is non-productive. Pertinent negatives include no chest pain, chills, ear congestion, ear pain, fever, headaches, heartburn, hemoptysis, myalgias, nasal congestion, postnasal drip, rash, rhinorrhea, sore throat, shortness of breath, sweats, weight loss or wheezing. The symptoms are aggravated by nothing. She has tried steroid inhaler and a beta-agonist inhaler for the symptoms. The treatment provided significant relief. Her past medical history is significant for asthma.  Back Pain This is a new problem. The current episode started 1 to 4 weeks ago. The problem occurs intermittently. The problem is unchanged. The pain is present in the lumbar spine. The quality of the pain is described as aching. The pain does not radiate. The pain is at a severity of 4/10. The pain is moderate. The pain is worse during the day. Stiffness is present all day. Pertinent negatives include no abdominal pain, bladder incontinence, bowel incontinence, chest pain, dysuria, fever, headaches, leg pain, numbness, paresis, paresthesias, pelvic pain, perianal numbness, tingling, weakness or weight loss. She has tried nothing for the symptoms.      Review of Systems  Constitutional: Negative for fever, chills, weight loss, diaphoresis, activity change, appetite change, fatigue and unexpected weight change.  HENT: Negative.  Negative for ear pain, sore throat, rhinorrhea and postnasal drip.   Eyes: Negative.   Respiratory: Positive for cough. Negative for apnea, hemoptysis, choking, chest tightness, shortness of breath, wheezing and stridor.   Cardiovascular: Negative for chest pain, palpitations and leg swelling.  Gastrointestinal: Negative for heartburn, nausea, vomiting,  abdominal pain, diarrhea, constipation and bowel incontinence.  Genitourinary: Negative for bladder incontinence, dysuria, urgency, frequency, hematuria, flank pain, decreased urine volume, enuresis, difficulty urinating, pelvic pain and dyspareunia.  Musculoskeletal: Negative for myalgias, back pain, joint swelling, arthralgias and gait problem.  Skin: Negative.  Negative for rash.  Neurological: Negative for dizziness, tingling, tremors, seizures, syncope, facial asymmetry, speech difficulty, weakness, light-headedness, numbness, headaches and paresthesias.  Hematological: Negative for adenopathy. Does not bruise/bleed easily.  Psychiatric/Behavioral: Positive for decreased concentration (memory loss and forgetfulness). Negative for suicidal ideas, hallucinations, behavioral problems, confusion, sleep disturbance, self-injury, dysphoric mood and agitation. The patient is not nervous/anxious and is not hyperactive.        Objective:   Physical Exam  Vitals reviewed. Constitutional: She is oriented to person, place, and time. She appears well-developed and well-nourished. No distress.  HENT:  Head: Normocephalic and atraumatic.  Mouth/Throat: Oropharynx is clear and moist. No oropharyngeal exudate.  Eyes: Conjunctivae are normal. Right eye exhibits no discharge. Left eye exhibits no discharge. No scleral icterus.  Neck: Normal range of motion. Neck supple. No JVD present. No tracheal deviation present. No thyromegaly present.  Cardiovascular: Normal rate, regular rhythm, normal heart sounds and intact distal pulses.  Exam reveals no gallop and no friction rub.   No murmur heard. Pulmonary/Chest: Effort normal and breath sounds normal. No stridor. No respiratory distress. She has no wheezes. She has no rales. She exhibits no tenderness.  Abdominal: Soft. Bowel sounds are normal. She exhibits no distension. There is no tenderness. There is no rebound and no guarding.  Musculoskeletal: Normal  range of motion. She exhibits no edema and no tenderness.       Lumbar back: Normal. She exhibits normal range of  motion, no tenderness, no bony tenderness, no swelling, no edema, no deformity, no laceration, no pain and no spasm.  Lymphadenopathy:    She has no cervical adenopathy.  Neurological: She is alert and oriented to person, place, and time. She has normal strength. She displays no atrophy, no tremor and normal reflexes. No cranial nerve deficit or sensory deficit. She exhibits normal muscle tone. She displays no seizure activity. Coordination and gait normal.  Reflex Scores:      Tricep reflexes are 1+ on the right side and 1+ on the left side.      Bicep reflexes are 1+ on the right side and 1+ on the left side.      Brachioradialis reflexes are 1+ on the right side and 1+ on the left side.      Patellar reflexes are 1+ on the right side and 1+ on the left side.      Achilles reflexes are 1+ on the right side and 1+ on the left side.      -SLR in both legs  Skin: Skin is warm and dry. No rash noted. She is not diaphoretic. No erythema. No pallor.  Psychiatric: She has a normal mood and affect. Her behavior is normal. Judgment and thought content normal.      Lab Results  Component Value Date   WBC 11.7* 09/21/2011   HGB 9.1* 09/21/2011   HCT 28.1* 09/21/2011   PLT 380.0 09/21/2011   GLUCOSE 100* 09/21/2011   CHOL 121 02/05/2011   TRIG 219.0* 02/05/2011   HDL 37.90* 02/05/2011   LDLDIRECT 54.1 02/05/2011   ALT 31 09/21/2011   AST 37 09/21/2011   NA 141 09/21/2011   K 4.2 09/21/2011   CL 105 09/21/2011   CREATININE 0.9 09/21/2011   BUN 12 09/21/2011   CO2 28 09/21/2011   TSH 2.20 09/21/2011   INR 0.97 07/12/2010   HGBA1C 7.3* 09/21/2011   MICROALBUR 3.2* 10/17/2010      Assessment & Plan:

## 2011-10-22 NOTE — Assessment & Plan Note (Signed)
Her last CBC showed an elevated WBC so I will recheck that today

## 2011-10-22 NOTE — Assessment & Plan Note (Signed)
I will check a CXR to see if she has PNA otherwise this is viral and does not need to be treated

## 2011-10-28 ENCOUNTER — Ambulatory Visit: Payer: Medicare PPO | Admitting: Internal Medicine

## 2011-10-28 ENCOUNTER — Other Ambulatory Visit: Payer: Self-pay | Admitting: Orthopaedic Surgery

## 2011-10-28 DIAGNOSIS — M545 Low back pain, unspecified: Secondary | ICD-10-CM

## 2011-11-02 ENCOUNTER — Ambulatory Visit
Admission: RE | Admit: 2011-11-02 | Discharge: 2011-11-02 | Disposition: A | Discharge status: Home / Self Care | Payer: Medicare PPO | Source: Ambulatory Visit | Attending: Orthopaedic Surgery | Admitting: Orthopaedic Surgery

## 2011-11-02 DIAGNOSIS — M545 Low back pain, unspecified: Secondary | ICD-10-CM

## 2011-11-10 ENCOUNTER — Ambulatory Visit: Payer: Medicare PPO | Admitting: Neurology

## 2011-11-12 ENCOUNTER — Ambulatory Visit: Payer: Medicare PPO | Admitting: Physical Therapy

## 2011-11-12 ENCOUNTER — Encounter: Payer: Self-pay | Admitting: Gastroenterology

## 2011-11-13 ENCOUNTER — Ambulatory Visit (INDEPENDENT_AMBULATORY_CARE_PROVIDER_SITE_OTHER): Payer: Medicare PPO | Admitting: Internal Medicine

## 2011-11-13 ENCOUNTER — Ambulatory Visit (INDEPENDENT_AMBULATORY_CARE_PROVIDER_SITE_OTHER)
Admission: RE | Admit: 2011-11-13 | Discharge: 2011-11-13 | Disposition: A | Discharge status: Home / Self Care | Payer: Medicare PPO | Source: Ambulatory Visit | Attending: Internal Medicine | Admitting: Internal Medicine

## 2011-11-13 ENCOUNTER — Encounter: Payer: Self-pay | Admitting: Internal Medicine

## 2011-11-13 VITALS — BP 138/78 | HR 90 | Temp 98.2°F | Wt 274.0 lb

## 2011-11-13 DIAGNOSIS — J209 Acute bronchitis, unspecified: Secondary | ICD-10-CM | POA: Insufficient documentation

## 2011-11-13 DIAGNOSIS — R059 Cough, unspecified: Secondary | ICD-10-CM

## 2011-11-13 DIAGNOSIS — R05 Cough: Secondary | ICD-10-CM

## 2011-11-13 DIAGNOSIS — J45901 Unspecified asthma with (acute) exacerbation: Secondary | ICD-10-CM

## 2011-11-13 MED ORDER — METHYLPREDNISOLONE ACETATE 80 MG/ML IJ SUSP
120.0000 mg | Freq: Once | INTRAMUSCULAR | Status: AC
  Administered 2011-11-13: 120 mg via INTRAMUSCULAR

## 2011-11-13 MED ORDER — PSEUDOEPH-CHLORPHEN-HYDROCOD 60-4-5 MG/5ML PO SOLN: 5.0000 mL | Freq: Four times a day (QID) | ORAL | Status: DC | PRN

## 2011-11-13 MED ORDER — AZITHROMYCIN 500 MG PO TABS: 500.0000 mg | ORAL_TABLET | Freq: Every day | ORAL | Status: AC

## 2011-11-13 NOTE — Assessment & Plan Note (Signed)
I will check a CXR to look for pna, mass, edema

## 2011-11-13 NOTE — Progress Notes (Signed)
Subjective:    Patient ID: Valerie [name], female    DOB: 06/05/1946, 65 y.o.   MRN: [id number]  Cough This is a new problem. Episode onset: one week. The problem has been gradually worsening. The problem occurs every few minutes. The cough is productive of purulent sputum. Associated symptoms include chills, a fever, myalgias, nasal congestion, postnasal drip, rhinorrhea, shortness of breath, sweats and wheezing. Pertinent negatives include no ear congestion, ear pain, headaches, heartburn, rash, sore throat or weight loss. The symptoms are aggravated by nothing. She has tried steroid inhaler and a beta-agonist inhaler for the symptoms. The treatment provided mild relief.      Review of Systems  Constitutional: Positive for fever, chills and fatigue. Negative for weight loss, diaphoresis, activity change, appetite change and unexpected weight change.  HENT: Positive for congestion, rhinorrhea, postnasal drip and sinus pressure. Negative for hearing loss, ear pain, nosebleeds, sore throat, facial swelling, mouth sores, trouble swallowing, neck pain, neck stiffness and ear discharge.   Eyes: Negative.   Respiratory: Positive for cough, shortness of breath and wheezing. Negative for apnea, choking, chest tightness and stridor.   Gastrointestinal: Negative for heartburn, nausea, vomiting, abdominal pain, diarrhea, constipation, blood in stool, abdominal distention and anal bleeding.  Genitourinary: Negative for dysuria, enuresis, difficulty urinating and dyspareunia.  Musculoskeletal: Positive for myalgias. Negative for back pain, joint swelling, arthralgias and gait problem.  Skin: Negative for color change, pallor, rash and wound.  Neurological: Negative for dizziness, tremors, seizures, syncope, facial asymmetry, speech difficulty, weakness, light-headedness, numbness and headaches.  Hematological: Negative for adenopathy. Does not bruise/bleed easily.  Psychiatric/Behavioral: Negative.        Objective:   Physical Exam  Vitals reviewed. Constitutional: She is oriented to person, place, and time. She appears well-developed and well-nourished. No distress.  HENT:  Head: Normocephalic and atraumatic.  Mouth/Throat: Oropharynx is clear and moist. No oropharyngeal exudate.  Eyes: Conjunctivae are normal. Right eye exhibits no discharge. Left eye exhibits no discharge. No scleral icterus.  Neck: Normal range of motion. Neck supple. No JVD present. No tracheal deviation present. No thyromegaly present.  Cardiovascular: Normal rate, regular rhythm, normal heart sounds and intact distal pulses.  Exam reveals no gallop and no friction rub.   No murmur heard. Pulmonary/Chest: Effort normal. No accessory muscle usage or stridor. Not tachypneic. No respiratory distress. She has no decreased breath sounds. She has no wheezes. She has rhonchi in the right middle field and the left middle field. She has no rales. She exhibits no tenderness.  Abdominal: Soft. Bowel sounds are normal. She exhibits no distension and no mass. There is no tenderness. There is no rebound and no guarding.  Musculoskeletal: Normal range of motion. She exhibits no edema and no tenderness.  Lymphadenopathy:    She has no cervical adenopathy.  Neurological: She is oriented to person, place, and time.  Skin: Skin is warm and dry. No rash noted. She is not diaphoretic. No erythema. No pallor.  Psychiatric: She has a normal mood and affect. Her behavior is normal. Judgment and thought content normal.      Lab Results  Component Value Date   WBC 15.4* 10/22/2011   HGB 10.2* 10/22/2011   HCT 31.5* 10/22/2011   PLT 362.0 10/22/2011   GLUCOSE 100* 09/21/2011   CHOL 121 02/05/2011   TRIG 219.0* 02/05/2011   HDL 37.90* 02/05/2011   LDLDIRECT 54.1 02/05/2011   ALT 31 09/21/2011   AST 37 09/21/2011   NA 141 09/21/2011  K 4.2 09/21/2011   CL 105 09/21/2011   CREATININE 0.9 09/21/2011   BUN 12 09/21/2011   CO2 28 09/21/2011   TSH  2.20 09/21/2011   INR 0.97 07/12/2010   HGBA1C 7.3* 09/21/2011   MICROALBUR 3.2* 10/17/2010      Assessment & Plan:

## 2011-11-13 NOTE — Patient Instructions (Signed)

## 2011-11-13 NOTE — Assessment & Plan Note (Signed)
She is having a flare of asthma so I gave her an injection with depo-medrol IM

## 2011-11-13 NOTE — Progress Notes (Signed)
Addended by: Brenton Grills C on: 11/13/2011 11:47 AM   Modules accepted: Orders

## 2011-11-13 NOTE — Assessment & Plan Note (Signed)
Start zpak for the infection and zutripro for the symptoms 

## 2011-11-18 ENCOUNTER — Encounter: Payer: Self-pay | Admitting: Internal Medicine

## 2011-11-18 ENCOUNTER — Ambulatory Visit (INDEPENDENT_AMBULATORY_CARE_PROVIDER_SITE_OTHER): Payer: Medicare PPO | Admitting: Internal Medicine

## 2011-11-18 ENCOUNTER — Other Ambulatory Visit (INDEPENDENT_AMBULATORY_CARE_PROVIDER_SITE_OTHER): Payer: Medicare PPO

## 2011-11-18 VITALS — BP 142/80 | HR 91 | Temp 98.4°F | Resp 20 | Wt 269.5 lb

## 2011-11-18 DIAGNOSIS — I10 Essential (primary) hypertension: Secondary | ICD-10-CM

## 2011-11-18 DIAGNOSIS — J45909 Unspecified asthma, uncomplicated: Secondary | ICD-10-CM

## 2011-11-18 DIAGNOSIS — D72829 Elevated white blood cell count, unspecified: Secondary | ICD-10-CM | POA: Insufficient documentation

## 2011-11-18 LAB — CBC WITH DIFFERENTIAL/PLATELET
Basophils Absolute: 0 10*3/uL (ref 0.0–0.1)
Basophils Relative: 0.3 % (ref 0.0–3.0)
Eosinophils Absolute: 0.1 10*3/uL (ref 0.0–0.7)
Eosinophils Relative: 0.9 % (ref 0.0–5.0)
HCT: 32.9 % — ABNORMAL LOW (ref 36.0–46.0)
Hemoglobin: 10.9 g/dL — ABNORMAL LOW (ref 12.0–15.0)
Lymphocytes Relative: 24.3 % (ref 12.0–46.0)
Lymphs Abs: 3.2 10*3/uL (ref 0.7–4.0)
MCHC: 33 g/dL (ref 30.0–36.0)
MCV: 79.6 fl (ref 78.0–100.0)
Monocytes Absolute: 1.1 10*3/uL — ABNORMAL HIGH (ref 0.1–1.0)
Monocytes Relative: 8.1 % (ref 3.0–12.0)
Neutro Abs: 8.9 10*3/uL — ABNORMAL HIGH (ref 1.4–7.7)
Neutrophils Relative %: 66.4 % (ref 43.0–77.0)
Platelets: 460 10*3/uL — ABNORMAL HIGH (ref 150.0–400.0)
RBC: 4.14 Mil/uL (ref 3.87–5.11)
RDW: 22.4 % — ABNORMAL HIGH (ref 11.5–14.6)
WBC: 13.4 10*3/uL — ABNORMAL HIGH (ref 4.5–10.5)

## 2011-11-18 LAB — SEDIMENTATION RATE: Sed Rate: 45 mm/hr — ABNORMAL HIGH (ref 0–22)

## 2011-11-18 MED ORDER — ALPRAZOLAM 2 MG PO TABS: 2.0000 mg | ORAL_TABLET | Freq: Three times a day (TID) | ORAL | Status: DC | PRN

## 2011-11-18 NOTE — Progress Notes (Signed)
  Subjective:    Patient ID: Valerie [name], female    DOB: Dec 28, 1945, 65 y.o.   MRN: [id number]  HPI She returns for f/up on the recent URI and she tells me that she is feeling better but she still has some cough and wheezing, she has not been using her albuterol inhaler because she tells me that she did not get it refilled.   Review of Systems  Constitutional: Negative for fever, chills, diaphoresis, activity change, appetite change, fatigue and unexpected weight change.  HENT: Negative.   Eyes: Negative.   Respiratory: Positive for cough and wheezing. Negative for apnea, choking, chest tightness, shortness of breath and stridor.   Cardiovascular: Negative for chest pain, palpitations and leg swelling.  Gastrointestinal: Negative.   Genitourinary: Negative.   Musculoskeletal: Negative.   Skin: Negative.   Neurological: Negative for dizziness, tremors, seizures, syncope, facial asymmetry, speech difficulty, weakness, light-headedness, numbness and headaches.  Hematological: Negative for adenopathy. Does not bruise/bleed easily.  Psychiatric/Behavioral: Negative.        Objective:   Physical Exam  Vitals reviewed. Constitutional: She is oriented to person, place, and time. She appears well-developed and well-nourished. No distress.  HENT:  Head: Normocephalic and atraumatic.  Mouth/Throat: Oropharynx is clear and moist. No oropharyngeal exudate.  Eyes: Conjunctivae are normal. Right eye exhibits no discharge. Left eye exhibits no discharge. No scleral icterus.  Neck: Normal range of motion. Neck supple. No JVD present. No tracheal deviation present. No thyromegaly present.  Cardiovascular: Normal rate, regular rhythm, normal heart sounds and intact distal pulses.  Exam reveals no gallop and no friction rub.   No murmur heard. Pulmonary/Chest: Effort normal. No accessory muscle usage or stridor. Not tachypneic. No respiratory distress. She has no decreased breath sounds. She has  wheezes in the right middle field and the left middle field. She has no rhonchi. She has no rales.       She received a jet neb with albuterol 2.5 mg and after that her lungs are CTA bilaterally  Abdominal: Soft. Bowel sounds are normal. She exhibits no distension and no mass. There is no tenderness. There is no rebound and no guarding.  Musculoskeletal: Normal range of motion. She exhibits no edema and no tenderness.  Lymphadenopathy:    She has no cervical adenopathy.  Neurological: She is oriented to person, place, and time.  Skin: Skin is warm and dry. No rash noted. She is not diaphoretic. No erythema. No pallor.  Psychiatric: She has a normal mood and affect. Her behavior is normal. Judgment and thought content normal.     Lab Results  Component Value Date   WBC 15.4* 10/22/2011   HGB 10.2* 10/22/2011   HCT 31.5* 10/22/2011   PLT 362.0 10/22/2011   GLUCOSE 100* 09/21/2011   CHOL 121 02/05/2011   TRIG 219.0* 02/05/2011   HDL 37.90* 02/05/2011   LDLDIRECT 54.1 02/05/2011   ALT 31 09/21/2011   AST 37 09/21/2011   NA 141 09/21/2011   K 4.2 09/21/2011   CL 105 09/21/2011   CREATININE 0.9 09/21/2011   BUN 12 09/21/2011   CO2 28 09/21/2011   TSH 2.20 09/21/2011   INR 0.97 07/12/2010   HGBA1C 7.3* 09/21/2011   MICROALBUR 3.2* 10/17/2010       Assessment & Plan:

## 2011-11-18 NOTE — Assessment & Plan Note (Signed)
I will recheck her CBC today and will look at an SPEP, ESR, and CRP to see if she has an inflammatory or lymphoproliferative disease

## 2011-11-18 NOTE — Assessment & Plan Note (Signed)
Her BP is well controlled 

## 2011-11-18 NOTE — Assessment & Plan Note (Signed)
She was given a sample of an albuterol inhaler an asked to continue the dulera as well

## 2011-11-18 NOTE — Patient Instructions (Signed)

## 2011-11-19 LAB — C-REACTIVE PROTEIN: CRP: 0.46 mg/dL (ref <0.60)

## 2011-11-20 ENCOUNTER — Encounter: Payer: Self-pay | Admitting: Internal Medicine

## 2011-11-20 LAB — PROTEIN ELECTROPHORESIS, SERUM
Albumin ELP: 53.6 % — ABNORMAL LOW (ref 55.8–66.1)
Alpha-1-Globulin: 5.8 % — ABNORMAL HIGH (ref 2.9–4.9)
Alpha-2-Globulin: 14.3 % — ABNORMAL HIGH (ref 7.1–11.8)
Beta 2: 7 % — ABNORMAL HIGH (ref 3.2–6.5)
Beta Globulin: 7.4 % — ABNORMAL HIGH (ref 4.7–7.2)
Gamma Globulin: 11.9 % (ref 11.1–18.8)
Total Protein, Serum Electrophoresis: 7.6 g/dL (ref 6.0–8.3)

## 2011-11-27 ENCOUNTER — Ambulatory Visit: Payer: Medicare PPO | Admitting: Neurology

## 2011-12-09 ENCOUNTER — Ambulatory Visit: Payer: Medicare PPO | Admitting: Internal Medicine

## 2012-03-04 ENCOUNTER — Ambulatory Visit: Payer: Medicare PPO | Admitting: Internal Medicine

## 2012-03-14 DEATH — deceased

## 2012-03-21 ENCOUNTER — Encounter: Payer: Self-pay | Admitting: Internal Medicine

## 2012-03-21 ENCOUNTER — Ambulatory Visit (INDEPENDENT_AMBULATORY_CARE_PROVIDER_SITE_OTHER): Payer: Medicare Other | Admitting: Internal Medicine

## 2012-03-21 ENCOUNTER — Other Ambulatory Visit (INDEPENDENT_AMBULATORY_CARE_PROVIDER_SITE_OTHER): Payer: Medicare Other

## 2012-03-21 VITALS — BP 134/80 | HR 90 | Temp 98.1°F | Resp 16 | Wt 281.0 lb

## 2012-03-21 DIAGNOSIS — E785 Hyperlipidemia, unspecified: Secondary | ICD-10-CM

## 2012-03-21 DIAGNOSIS — I10 Essential (primary) hypertension: Secondary | ICD-10-CM

## 2012-03-21 DIAGNOSIS — E119 Type 2 diabetes mellitus without complications: Secondary | ICD-10-CM

## 2012-03-21 DIAGNOSIS — L819 Disorder of pigmentation, unspecified: Secondary | ICD-10-CM

## 2012-03-21 DIAGNOSIS — E039 Hypothyroidism, unspecified: Secondary | ICD-10-CM

## 2012-03-21 DIAGNOSIS — D72829 Elevated white blood cell count, unspecified: Secondary | ICD-10-CM

## 2012-03-21 LAB — CBC WITH DIFFERENTIAL/PLATELET
Basophils Absolute: 0 10*3/uL (ref 0.0–0.1)
Basophils Relative: 0.2 % (ref 0.0–3.0)
Eosinophils Absolute: 0.3 10*3/uL (ref 0.0–0.7)
Eosinophils Relative: 2.6 % (ref 0.0–5.0)
HCT: 34.3 % — ABNORMAL LOW (ref 36.0–46.0)
Hemoglobin: 11.1 g/dL — ABNORMAL LOW (ref 12.0–15.0)
Lymphocytes Relative: 23.4 % (ref 12.0–46.0)
Lymphs Abs: 2.6 10*3/uL (ref 0.7–4.0)
MCHC: 32.3 g/dL (ref 30.0–36.0)
MCV: 84 fl (ref 78.0–100.0)
Monocytes Absolute: 0.9 10*3/uL (ref 0.1–1.0)
Monocytes Relative: 8.5 % (ref 3.0–12.0)
Neutro Abs: 7.2 10*3/uL (ref 1.4–7.7)
Neutrophils Relative %: 65.3 % (ref 43.0–77.0)
Platelets: 371 10*3/uL (ref 150.0–400.0)
RBC: 4.08 Mil/uL (ref 3.87–5.11)
RDW: 21.9 % — ABNORMAL HIGH (ref 11.5–14.6)
WBC: 11.1 10*3/uL — ABNORMAL HIGH (ref 4.5–10.5)

## 2012-03-21 LAB — TSH: TSH: 1.63 u[IU]/mL (ref 0.35–5.50)

## 2012-03-21 LAB — COMPREHENSIVE METABOLIC PANEL
ALT: 22 U/L (ref 0–35)
AST: 27 U/L (ref 0–37)
Albumin: 4.1 g/dL (ref 3.5–5.2)
Alkaline Phosphatase: 92 U/L (ref 39–117)
BUN: 9 mg/dL (ref 6–23)
CO2: 27 mEq/L (ref 19–32)
Calcium: 9.4 mg/dL (ref 8.4–10.5)
Chloride: 104 mEq/L (ref 96–112)
Creatinine, Ser: 0.9 mg/dL (ref 0.4–1.2)
GFR: 81.06 mL/min (ref >60.00)
Glucose, Bld: 145 mg/dL — ABNORMAL HIGH (ref 70–99)
Potassium: 4.6 mEq/L (ref 3.5–5.1)
Sodium: 141 mEq/L (ref 135–145)
Total Bilirubin: 0.3 mg/dL (ref 0.3–1.2)
Total Protein: 7.7 g/dL (ref 6.0–8.3)

## 2012-03-21 LAB — LDL CHOLESTEROL, DIRECT: Direct LDL: 104.3 mg/dL

## 2012-03-21 LAB — LIPID PANEL
Cholesterol: 200 mg/dL (ref 0–200)
HDL: 51.7 mg/dL (ref >39.00)
Total CHOL/HDL Ratio: 4
Triglycerides: 286 mg/dL — ABNORMAL HIGH (ref 0.0–149.0)
VLDL: 57.2 mg/dL — ABNORMAL HIGH (ref 0.0–40.0)

## 2012-03-21 NOTE — Assessment & Plan Note (Signed)
She is doing well on crestor, I will check her labs today 

## 2012-03-21 NOTE — Patient Instructions (Signed)

## 2012-03-21 NOTE — Assessment & Plan Note (Signed)
I will check her a1c and will monitor her renal function today 

## 2012-03-21 NOTE — Assessment & Plan Note (Signed)
I will check her TSH today 

## 2012-03-21 NOTE — Progress Notes (Signed)
Subjective:    Patient ID: Valerie [name], female    DOB: [birth year], 66 y.o.   MRN: [id number]  Diabetes She presents for her follow-up diabetic visit. She has type 2 diabetes mellitus. Her disease course has been fluctuating. There are no hypoglycemic associated symptoms. Pertinent negatives for hypoglycemia include no dizziness, headaches, pallor, seizures, speech difficulty or tremors. Pertinent negatives for diabetes include no blurred vision, no chest pain, no fatigue, no foot paresthesias, no foot ulcerations, no polydipsia, no polyphagia, no polyuria, no visual change, no weakness and no weight loss. There are no hypoglycemic complications. Symptoms are stable. There are no diabetic complications. Current diabetic treatment includes oral agent (monotherapy). She is compliant with treatment all of the time. Her weight is increasing steadily. She is following a generally unhealthy diet. When asked about meal planning, she reported none. She never participates in exercise. There is no change in her home blood glucose trend. An ACE inhibitor/angiotensin II receptor blocker is being taken. She does not see a podiatrist.Eye exam is current.      Review of Systems  Constitutional: Negative for fever, chills, weight loss, diaphoresis, activity change, appetite change, fatigue and unexpected weight change.  HENT: Negative.   Eyes: Negative.  Negative for blurred vision.  Respiratory: Negative for apnea, cough, chest tightness, shortness of breath, wheezing and stridor.   Cardiovascular: Negative for chest pain, palpitations and leg swelling.  Gastrointestinal: Negative for nausea, vomiting, abdominal pain, diarrhea, constipation, blood in stool, abdominal distention and anal bleeding.  Genitourinary: Negative.  Negative for polyuria.  Musculoskeletal: Negative for myalgias, back pain, joint swelling, arthralgias and gait problem.  Skin: Negative for color change, pallor, rash and wound.    Neurological: Negative for dizziness, tremors, seizures, syncope, facial asymmetry, speech difficulty, weakness, light-headedness, numbness and headaches.  Hematological: Negative for polydipsia, polyphagia and adenopathy. Does not bruise/bleed easily.  Psychiatric/Behavioral: Negative.        Objective:   Physical Exam  Vitals reviewed. Constitutional: She is oriented to person, place, and time. She appears well-developed and well-nourished. No distress.  HENT:  Head: Normocephalic and atraumatic.  Mouth/Throat: Oropharynx is clear and moist. No oropharyngeal exudate.  Eyes: Conjunctivae are normal. Right eye exhibits no discharge. Left eye exhibits no discharge. No scleral icterus.  Neck: Normal range of motion. Neck supple. No JVD present. No tracheal deviation present. No thyromegaly present.  Cardiovascular: Normal rate, regular rhythm, normal heart sounds and intact distal pulses.  Exam reveals no gallop and no friction rub.   No murmur heard. Pulmonary/Chest: Effort normal and breath sounds normal. No stridor. No respiratory distress. She has no wheezes. She has no rales. She exhibits no tenderness.  Abdominal: Soft. Bowel sounds are normal. She exhibits no distension and no mass. There is no tenderness. There is no rebound and no guarding.  Musculoskeletal: Normal range of motion. She exhibits no edema and no tenderness.  Lymphadenopathy:    She has no cervical adenopathy.  Neurological: She is oriented to person, place, and time.  Skin: Skin is warm, dry and intact. Lesion (there is a raised black lesion, very distinct, on her right forearm) noted. No abrasion, no bruising, no burn, no ecchymosis, no laceration and no rash noted. Rash is not maculopapular. She is not diaphoretic. No cyanosis or erythema. No pallor. Nails show no clubbing.     Psychiatric: She has a normal mood and affect. Her behavior is normal. Judgment and thought content normal.      Lab Results  Component Value Date   WBC 13.4* 11/18/2011   HGB 10.9* 11/18/2011   HCT 32.9* 11/18/2011   PLT 460.0* 11/18/2011   GLUCOSE 100* 09/21/2011   CHOL 121 02/05/2011   TRIG 219.0* 02/05/2011   HDL 37.90* 02/05/2011   LDLDIRECT 54.1 02/05/2011   ALT 31 09/21/2011   AST 37 09/21/2011   NA 141 09/21/2011   K 4.2 09/21/2011   CL 105 09/21/2011   CREATININE 0.9 09/21/2011   BUN 12 09/21/2011   CO2 28 09/21/2011   TSH 2.20 09/21/2011   INR 0.97 07/12/2010   HGBA1C 7.3* 09/21/2011   MICROALBUR 3.2* 10/17/2010      Assessment & Plan:

## 2012-03-21 NOTE — Assessment & Plan Note (Signed)
Her BP is well controlled, I will check her lytes and renal function 

## 2012-03-21 NOTE — Assessment & Plan Note (Signed)
Will recheck her CBC today 

## 2012-03-21 NOTE — Assessment & Plan Note (Signed)
Derm referral.

## 2012-03-24 LAB — HEMOGLOBIN A1C: Hgb A1c MFr Bld: 6.5 % (ref 4.6–6.5)

## 2012-03-25 ENCOUNTER — Encounter: Payer: Self-pay | Admitting: Internal Medicine

## 2012-05-04 ENCOUNTER — Other Ambulatory Visit: Payer: Self-pay | Admitting: Internal Medicine

## 2012-05-04 ENCOUNTER — Encounter: Payer: Self-pay | Admitting: *Deleted

## 2012-05-04 ENCOUNTER — Encounter: Payer: Medicare Other | Attending: Internal Medicine | Admitting: *Deleted

## 2012-05-04 VITALS — Ht 66.0 in | Wt 283.4 lb

## 2012-05-04 DIAGNOSIS — E669 Obesity, unspecified: Secondary | ICD-10-CM

## 2012-05-04 DIAGNOSIS — Z713 Dietary counseling and surveillance: Secondary | ICD-10-CM | POA: Insufficient documentation

## 2012-05-04 DIAGNOSIS — E119 Type 2 diabetes mellitus without complications: Secondary | ICD-10-CM

## 2012-05-04 NOTE — Progress Notes (Signed)
  Medical Nutrition Therapy:  Appt start time: 1100 end time:  1200.  Assessment:  Primary concerns today: patient here for assistance with weight loss and diabetes. She has been here for diabetes education in the past and worked with [employer]. Today she states she spends a lot of her time caring for her sick husband who is critical of her weight and sabotages her efforts for healthier food choices. She also has a 64 year old son with cerebral palsy that she cares for too. She states she has been on many diets in the past and weighed less than 150 pounds until she turned 66 years old. She states she used to walk quite a bit, but gets SOB with walking now and is not able to walk the 40 minutes her MD requested at her last visit.  MEDICATIONS: see list   DIETARY INTAKE:  Usual eating pattern includes 1-2 meals and 3 snacks per day.  Everyday foods include sandwiches, chips, soda.  Avoided foods include grapefruit .    24-hr recall:  B ( AM): skip  Snk ( AM): chips OR fresh fruit OR canned fruit  L ( PM): occasional sandwich Snk ( PM): same as AM D ( PM): largest meal: meat, starch, veg OR sandwich OR hamburger Snk ( PM): ice cream OR sorbet OR sugar free popsicle Beverages: likes regular or diet soda, diet cranberry juice, milk,   Usual physical activity: limited due to back and knee pain  Estimated energy needs: 1500 calories 170 g carbohydrates 112 g protein 42 g fat  Progress Towards Goal(s):  In progress.   Nutritional Diagnosis:  NI-1.5 Excessive energy intake As related to activity level.  As evidenced by BMI of 45.8% .    Intervention:  Nutrition counseling provided as well as basic diabetes education. Emphasized weight loss goals as BG appear to be in fairly good control.  Plan: Aim for 2 Carb Choices (30 grams) per meal +/- 1 either way May use 8-12 oz buttermilk for Carb Choices in AM Read food labels for total carbohydrate of meals Increase activity level by walking  5-15 minutes every day  Handouts given during visit include: Carb Counting and Food Label handouts Meal Plan Card  Monitoring/Evaluation:  Dietary intake, exercise, reading food labels, and body weight in 4 week(s).

## 2012-05-04 NOTE — Patient Instructions (Signed)
Plan: Aim for 2 Carb Choices (30 grams) per meal +/- 1 either way May use 8-12 oz buttermilk for Carb Choices in AM Read food labels for total carbohydrate of meals Increase activity level by walking 5-15 minutes every day

## 2012-06-01 ENCOUNTER — Ambulatory Visit: Payer: Medicare Other | Admitting: Internal Medicine

## 2012-06-03 ENCOUNTER — Ambulatory Visit: Payer: Medicare Other | Admitting: *Deleted

## 2012-07-22 ENCOUNTER — Ambulatory Visit: Payer: Medicare Other | Admitting: Internal Medicine

## 2012-08-03 ENCOUNTER — Emergency Department (HOSPITAL_COMMUNITY): Payer: Medicare Other

## 2012-08-03 ENCOUNTER — Emergency Department (HOSPITAL_COMMUNITY)
Admission: EM | Admit: 2012-08-03 | Discharge: 2012-08-03 | Disposition: A | Discharge status: Home / Self Care | Payer: Medicare Other | Attending: Emergency Medicine | Admitting: Emergency Medicine

## 2012-08-03 ENCOUNTER — Encounter: Payer: Self-pay | Admitting: Internal Medicine

## 2012-08-03 ENCOUNTER — Encounter (HOSPITAL_COMMUNITY): Payer: Self-pay

## 2012-08-03 ENCOUNTER — Ambulatory Visit (INDEPENDENT_AMBULATORY_CARE_PROVIDER_SITE_OTHER): Payer: Medicare Other | Admitting: Internal Medicine

## 2012-08-03 VITALS — BP 140/82 | HR 84 | Temp 98.5°F | Resp 16 | Wt 281.5 lb

## 2012-08-03 DIAGNOSIS — J45909 Unspecified asthma, uncomplicated: Secondary | ICD-10-CM | POA: Insufficient documentation

## 2012-08-03 DIAGNOSIS — M79609 Pain in unspecified limb: Secondary | ICD-10-CM | POA: Insufficient documentation

## 2012-08-03 DIAGNOSIS — B029 Zoster without complications: Secondary | ICD-10-CM

## 2012-08-03 DIAGNOSIS — R4182 Altered mental status, unspecified: Secondary | ICD-10-CM | POA: Insufficient documentation

## 2012-08-03 DIAGNOSIS — R4789 Other speech disturbances: Secondary | ICD-10-CM

## 2012-08-03 DIAGNOSIS — F329 Major depressive disorder, single episode, unspecified: Secondary | ICD-10-CM | POA: Insufficient documentation

## 2012-08-03 DIAGNOSIS — M545 Low back pain, unspecified: Secondary | ICD-10-CM

## 2012-08-03 DIAGNOSIS — R4781 Slurred speech: Secondary | ICD-10-CM

## 2012-08-03 DIAGNOSIS — K219 Gastro-esophageal reflux disease without esophagitis: Secondary | ICD-10-CM | POA: Insufficient documentation

## 2012-08-03 DIAGNOSIS — Z79899 Other long term (current) drug therapy: Secondary | ICD-10-CM | POA: Insufficient documentation

## 2012-08-03 DIAGNOSIS — F3289 Other specified depressive episodes: Secondary | ICD-10-CM | POA: Insufficient documentation

## 2012-08-03 DIAGNOSIS — E119 Type 2 diabetes mellitus without complications: Secondary | ICD-10-CM | POA: Insufficient documentation

## 2012-08-03 DIAGNOSIS — F411 Generalized anxiety disorder: Secondary | ICD-10-CM | POA: Insufficient documentation

## 2012-08-03 DIAGNOSIS — F419 Anxiety disorder, unspecified: Secondary | ICD-10-CM

## 2012-08-03 DIAGNOSIS — I10 Essential (primary) hypertension: Secondary | ICD-10-CM | POA: Insufficient documentation

## 2012-08-03 DIAGNOSIS — R079 Chest pain, unspecified: Secondary | ICD-10-CM

## 2012-08-03 DIAGNOSIS — R471 Dysarthria and anarthria: Secondary | ICD-10-CM

## 2012-08-03 DIAGNOSIS — M79646 Pain in unspecified finger(s): Secondary | ICD-10-CM

## 2012-08-03 LAB — URINALYSIS, ROUTINE W REFLEX MICROSCOPIC
Bilirubin Urine: NEGATIVE
Glucose, UA: NEGATIVE mg/dL
Hgb urine dipstick: NEGATIVE
Ketones, ur: NEGATIVE mg/dL
Leukocytes, UA: NEGATIVE
Nitrite: NEGATIVE
Protein, ur: NEGATIVE mg/dL
Specific Gravity, Urine: 1.006 (ref 1.005–1.030)
Urobilinogen, UA: 0.2 mg/dL (ref 0.0–1.0)
pH: 6 (ref 5.0–8.0)

## 2012-08-03 LAB — COMPREHENSIVE METABOLIC PANEL
ALT: 21 U/L (ref 0–35)
AST: 24 U/L (ref 0–37)
Albumin: 3.9 g/dL (ref 3.5–5.2)
Alkaline Phosphatase: 90 U/L (ref 39–117)
BUN: 7 mg/dL (ref 6–23)
CO2: 25 mEq/L (ref 19–32)
Calcium: 9.7 mg/dL (ref 8.4–10.5)
Chloride: 107 mEq/L (ref 96–112)
Creatinine, Ser: 0.7 mg/dL (ref 0.50–1.10)
GFR calc Af Amer: >90 mL/min (ref >90)
GFR calc non Af Amer: 90 mL/min — ABNORMAL LOW (ref >90)
Glucose, Bld: 105 mg/dL — ABNORMAL HIGH (ref 70–99)
Potassium: 3.8 mEq/L (ref 3.5–5.1)
Sodium: 143 mEq/L (ref 135–145)
Total Bilirubin: 0.3 mg/dL (ref 0.3–1.2)
Total Protein: 7.3 g/dL (ref 6.0–8.3)

## 2012-08-03 LAB — CBC
HCT: 34.5 % — ABNORMAL LOW (ref 36.0–46.0)
Hemoglobin: 10.9 g/dL — ABNORMAL LOW (ref 12.0–15.0)
MCH: 27.4 pg (ref 26.0–34.0)
MCHC: 31.6 g/dL (ref 30.0–36.0)
MCV: 86.7 fL (ref 78.0–100.0)
Platelets: 319 10*3/uL (ref 150–400)
RBC: 3.98 MIL/uL (ref 3.87–5.11)
RDW: 16.3 % — ABNORMAL HIGH (ref 11.5–15.5)
WBC: 9.1 10*3/uL (ref 4.0–10.5)

## 2012-08-03 LAB — TROPONIN I: Troponin I: <0.3 ng/mL (ref <0.30)

## 2012-08-03 MED ORDER — VALACYCLOVIR HCL 1 G PO TABS: 1000.0000 mg | ORAL_TABLET | Freq: Three times a day (TID) | ORAL | Status: AC

## 2012-08-03 MED ORDER — PREDNISONE 20 MG PO TABS: ORAL_TABLET | ORAL | Status: AC

## 2012-08-03 MED ORDER — HYDROCODONE-ACETAMINOPHEN 5-500 MG PO TABS: 1.0000 | ORAL_TABLET | Freq: Four times a day (QID) | ORAL | Status: AC | PRN

## 2012-08-03 NOTE — Progress Notes (Signed)
Orthopedic Tech Progress Note Patient Details:  Valerie [name] March 28, 1946 [id number]  Ortho Devices Type of Ortho Device: Thumb velcro splint Ortho Device/Splint Location: right hand Ortho Device/Splint Interventions: Application   Khiree Bukhari 08/03/2012, 9:11 PM

## 2012-08-03 NOTE — Progress Notes (Signed)
Subjective:    Patient ID: Valerie [name], female    DOB: 12-Oct-1946, 66 y.o.   MRN: [id number]  HPI  She returns for f/up and complains of feeling off balance with weakness on her left side (left leg more weak than left arm.) She feels like she has been "tripping over air" with her left foot. Today she has also developed trouble with finding her words and forming her speech with slurring.  Review of Systems  HENT: Negative.   Eyes: Negative.   Respiratory: Negative for apnea, cough, chest tightness, shortness of breath, wheezing and stridor.   Cardiovascular: Positive for chest pain. Negative for palpitations and leg swelling.  Gastrointestinal: Negative.   Genitourinary: Negative.   Musculoskeletal: Negative for myalgias, back pain, joint swelling, arthralgias and gait problem.  Skin: Positive for rash (painful rash and blisters on her left buttock). Negative for color change and pallor.  Neurological: Positive for speech difficulty and weakness. Negative for dizziness, tremors, seizures, syncope, facial asymmetry, light-headedness, numbness and headaches.  Hematological: Negative for adenopathy. Does not bruise/bleed easily.  Psychiatric/Behavioral: Negative.        Objective:   Physical Exam  Vitals reviewed. Constitutional: She is oriented to person, place, and time. She appears well-developed and well-nourished. No distress.  HENT:  Head: Normocephalic and atraumatic.  Mouth/Throat: Oropharynx is clear and moist. No oropharyngeal exudate.  Eyes: Conjunctivae are normal. Right eye exhibits no discharge. Left eye exhibits no discharge. No scleral icterus.  Neck: Normal range of motion. Neck supple. No JVD present. No tracheal deviation present. No thyromegaly present.  Cardiovascular: Normal rate, regular rhythm, normal heart sounds and intact distal pulses.  Exam reveals no gallop and no friction rub.   No murmur heard. Pulmonary/Chest: Effort normal and breath sounds normal.  No stridor. No respiratory distress. She has no wheezes. She has no rales. She exhibits no tenderness.  Abdominal: Soft. Bowel sounds are normal. She exhibits no distension and no mass. There is no tenderness. There is no rebound and no guarding.  Musculoskeletal: Normal range of motion. She exhibits no edema and no tenderness.  Lymphadenopathy:    She has no cervical adenopathy.  Neurological: She is alert and oriented to person, place, and time. She displays no atrophy, no tremor and normal reflexes. No cranial nerve deficit or sensory deficit. She exhibits abnormal muscle tone (weakness in left leg). She displays no seizure activity. Coordination (she fell to the left side) and gait (she is staggering) abnormal. She displays no Babinski's sign on the right side. She displays Babinski's sign on the left side.  Reflex Scores:      Tricep reflexes are 0 on the right side and 0 on the left side.      Bicep reflexes are 0 on the right side and 0 on the left side.      Brachioradialis reflexes are 0 on the right side and 0 on the left side.      Patellar reflexes are 0 on the right side and 0 on the left side.      Achilles reflexes are 0 on the right side and 0 on the left side. Skin: Skin is warm and dry. Rash noted. She is not diaphoretic. No erythema. No pallor.     Psychiatric: She has a normal mood and affect. Her behavior is normal. Judgment and thought content normal.     Lab Results  Component Value Date   WBC 11.1* 03/21/2012   HGB 11.1* 03/21/2012  HCT 34.3* 03/21/2012   PLT 371.0 03/21/2012   GLUCOSE 145* 03/21/2012   CHOL 200 03/21/2012   TRIG 286.0* 03/21/2012   HDL 51.70 03/21/2012   LDLDIRECT 104.3 03/21/2012   ALT 22 03/21/2012   AST 27 03/21/2012   NA 141 03/21/2012   K 4.6 03/21/2012   CL 104 03/21/2012   CREATININE 0.9 03/21/2012   BUN 9 03/21/2012   CO2 27 03/21/2012   TSH 1.63 03/21/2012   INR 0.97 07/12/2010   HGBA1C 6.5 03/21/2012   MICROALBUR 3.2* 10/17/2010       Assessment & Plan:

## 2012-08-03 NOTE — ED Notes (Signed)
Met with pt and offered emotional support. Discussed pt's many responsibilities/stressors at home, caring for her husband (ESRD) and her son with physical disabilities.  Pt has a psychiatrist who she has seen regularly since 1995 and is on medication.  Per son, pt could have time for herself away from her home, but she chooses not to take it.  Pt encouraged to have some alone time and care for herself.  Pt not interested in placement options/daycare for her husband or other programming for her son.

## 2012-08-03 NOTE — ED Provider Notes (Signed)
History     CSN: [id number]  Arrival date & time 08/03/12  1410   First MD Initiated Contact with Patient 08/03/12 1459      Chief Complaint  Patient presents with  . Altered Mental Status  . Weakness  . Aphasia    (Consider location/radiation/quality/duration/timing/severity/associated sxs/prior treatment) Patient is a 66 y.o. female presenting with altered mental status and weakness. The history is provided by the patient and a relative. The history is limited by the condition of the patient (pt very poor/difficult historian).  Altered Mental Status Associated symptoms include chest pain. Pertinent negatives include no abdominal pain, no headaches and no shortness of breath.  Weakness The primary symptoms include altered mental status. Primary symptoms do not include headaches, fever or vomiting.  Additional symptoms include weakness.  pt from home with trouble finding words, periods of slurred/dysarthric speech x 1 week, more constant in past 24 hrs. Pt also states had sharp pain midchest for past day, constant, non radiation. No specific exacerbating or alleviating symptoms. Denies cough or sob. No fever or chills. No pleuritic or lateralizing pain. States 2 prior caths in past neg for signif cad. No leg pain or swelling. No dvt or pe hx. Pt denies any focal numbness/weakness, but states feels weak all over. No change in vision. Denies hx cva. Denies recent change in meds. Pt also c/o pain to base of right thumb for past several days, denies injury. Also c/o painful rash to left buttock/sacral area for the past few days, constant.  Pt at times appears anxious, frustrated/stressed - when asked about states has a great amount of stress/anxiety every day.  Past Medical History  Diagnosis Date  . Anemia   . Depression   . Diabetes mellitus, type 2   . GERD (gastroesophageal reflux disease)   . Hyperlipidemia   . Hypertension   . Sleep apnea   . Asthma   . Hemorrhoids     Past  Surgical History  Procedure Date  . Benign tumors resected   . Tubal ligation     Family History  Problem Relation Age of Onset  . Hypertension      family history  . Alcohol abuse    . Alcohol abuse Mother   . Heart attack Mother   . Coronary artery disease Brother     History  Substance Use Topics  . Smoking status: Never Smoker   . Smokeless tobacco: Not on file  . Alcohol Use: No    OB History    Grav Para Term Preterm Abortions TAB SAB Ect Mult Living                  Review of Systems  Constitutional: Negative for fever and chills.  HENT: Negative for neck pain.   Eyes: Negative for redness and visual disturbance.  Respiratory: Negative for cough and shortness of breath.   Cardiovascular: Positive for chest pain. Negative for leg swelling.  Gastrointestinal: Negative for vomiting and abdominal pain.  Genitourinary: Negative for flank pain.  Musculoskeletal: Negative for back pain.  Skin: Negative for rash.  Neurological: Positive for weakness. Negative for numbness and headaches.  Hematological: Does not bruise/bleed easily.  Psychiatric/Behavioral: Positive for altered mental status.       Anxiety    Allergies  Food and Penicillins  Home Medications   Current Outpatient Rx  Name Route Sig Dispense Refill  . ALBUTEROL SULFATE HFA 108 (90 BASE) MCG/ACT IN AERS Inhalation Inhale 2 puffs into the lungs  every 6 (six) hours as needed.      . ALPRAZOLAM 2 MG PO TABS Oral Take 1 tablet (2 mg total) by mouth 3 (three) times daily as needed. 65 tablet 1  . AMLODIPINE BESYLATE-VALSARTAN 10-320 MG PO TABS Oral Take 1 tablet by mouth daily.      [name] CARVEDILOL 25 MG PO TABS Oral Take 25 mg by mouth 2 (two) times daily with a meal.    . CARVEDILOL 25 MG PO TABS  TAKE ONE TABLET BY MOUTH TWICE DAILY WITH MEALS 180 tablet 3  . FERROUS SULFATE DRIED 200 (65 FE) MG PO TABS Oral Take 1 tablet by mouth 2 (two) times daily. With food     . FLUOXETINE HCL 40 MG PO CAPS Oral  Take 40 mg by mouth daily.    [name] HYDROCODONE-IBUPROFEN 10-200 MG PO TABS Oral Take 1 tablet by mouth 3 (three) times daily as needed (for pain). 65 each 3  . ISOSORBIDE MONONITRATE ER 60 MG PO TB24  Take 1/2 tablet by mouth daily     . LAMOTRIGINE 200 MG PO TABS Oral Take 200 mg by mouth daily as needed.     [name] LATANOPROST 0.005 % OP SOLN Both Eyes Place 1 drop into both eyes at bedtime.    [name] LEVOTHYROXINE SODIUM 50 MCG PO TABS Oral Take 50 mcg by mouth daily.      . MOMETASONE FURO-FORMOTEROL FUM 200-5 MCG/ACT IN AERO Inhalation Inhale 2 Act into the lungs 2 (two) times daily. 3 Inhaler 0  . MONTELUKAST SODIUM 10 MG PO TABS Oral Take 10 mg by mouth at bedtime.      [name] NITROGLYCERIN 0.4 MG SL SUBL Sublingual Place 0.4 mg under the tongue every 5 (five) minutes as needed.      [name] OMEPRAZOLE 40 MG PO CPDR Oral Take 40 mg by mouth daily.      [name] PIOGLITAZONE HCL 30 MG PO TABS Oral Take 1 tablet (30 mg total) by mouth daily. 30 tablet 11  . ROSUVASTATIN CALCIUM 10 MG PO TABS Oral Take 10 mg by mouth daily.        BP 151/68  Pulse 61  Temp 98.2 F (36.8 C) (Oral)  Resp 20  SpO2 100%  Physical Exam  Nursing note and vitals reviewed. Constitutional: She is oriented to person, place, and time. She appears well-developed and well-nourished. No distress.  HENT:  Head: Atraumatic.  Nose: Nose normal.  Mouth/Throat: Oropharynx is clear and moist.       No sinus or temporal tenderness.  Eyes: Conjunctivae and EOM are normal. Pupils are equal, round, and reactive to light. No scleral icterus.  Neck: Neck supple. No tracheal deviation present. No thyromegaly present.       No stiffness or rigidity. No carotid bruits  Cardiovascular: Normal rate, regular rhythm, normal heart sounds and intact distal pulses.  Exam reveals no gallop and no friction rub.   No murmur heard. Pulmonary/Chest: Effort normal and breath sounds normal. No respiratory distress.  Abdominal: Soft. Normal appearance and bowel sounds  are normal. She exhibits no distension. There is no tenderness.  Genitourinary:       No cva tenderness.  Musculoskeletal: Normal range of motion. She exhibits no edema and no tenderness.  Neurological: She is alert and oriented to person, place, and time. No cranial nerve deficit.       Motor intact bilaterally. Steady gait.   Skin: Skin is warm and dry. She is not diaphoretic.  Sparse vesicular rash, erythematous base, areas scabbing left sacral/buttock area c/w shingles.   Psychiatric: She has a normal mood and affect.    ED Course  Procedures (including critical care time)   Labs Reviewed  CBC  COMPREHENSIVE METABOLIC PANEL  URINALYSIS, ROUTINE W REFLEX MICROSCOPIC  TROPONIN I   Results for orders placed during the hospital encounter of 08/03/12  CBC      Component Value Range   WBC 9.1  4.0 - 10.5 K/uL   RBC 3.98  3.87 - 5.11 MIL/uL   Hemoglobin 10.9 (*) 12.0 - 15.0 g/dL   HCT 16.1 (*) 09.6 - 04.5 %   MCV 86.7  78.0 - 100.0 fL   MCH 27.4  26.0 - 34.0 pg   MCHC 31.6  30.0 - 36.0 g/dL   RDW 40.9 (*) 81.1 - 91.4 %   Platelets 319  150 - 400 K/uL  COMPREHENSIVE METABOLIC PANEL      Component Value Range   Sodium 143  135 - 145 mEq/L   Potassium 3.8  3.5 - 5.1 mEq/L   Chloride 107  96 - 112 mEq/L   CO2 25  19 - 32 mEq/L   Glucose, Bld 105 (*) 70 - 99 mg/dL   BUN 7  6 - 23 mg/dL   Creatinine, Ser 7.82  0.50 - 1.10 mg/dL   Calcium 9.7  8.4 - 95.6 mg/dL   Total Protein 7.3  6.0 - 8.3 g/dL   Albumin 3.9  3.5 - 5.2 g/dL   AST 24  0 - 37 U/L   ALT 21  0 - 35 U/L   Alkaline Phosphatase 90  39 - 117 U/L   Total Bilirubin 0.3  0.3 - 1.2 mg/dL   GFR calc non Af Amer 90 (*) >90 mL/min   GFR calc Af Amer >90  >90 mL/min  URINALYSIS, ROUTINE W REFLEX MICROSCOPIC      Component Value Range   Color, Urine YELLOW  YELLOW   APPearance CLEAR  CLEAR   Specific Gravity, Urine 1.006  1.005 - 1.030   pH 6.0  5.0 - 8.0   Glucose, UA NEGATIVE  NEGATIVE mg/dL   Hgb urine  dipstick NEGATIVE  NEGATIVE   Bilirubin Urine NEGATIVE  NEGATIVE   Ketones, ur NEGATIVE  NEGATIVE mg/dL   Protein, ur NEGATIVE  NEGATIVE mg/dL   Urobilinogen, UA 0.2  0.0 - 1.0 mg/dL   Nitrite NEGATIVE  NEGATIVE   Leukocytes, UA NEGATIVE  NEGATIVE  TROPONIN I      Component Value Range   Troponin I <0.30  <0.30 ng/mL   Dg Chest 2 View  08/03/2012  *RADIOLOGY REPORT*  Clinical Data: Chest pain, left-sided weakness  CHEST - 2 VIEW  Comparison: 11/13/2011  Findings: Cardiomediastinal silhouette is within normal limits. The lungs are clear. No pleural effusion.  No pneumothorax.  No acute osseous abnormality.  Stable bilateral diaphragmatic eventration.  IMPRESSION: No acute cardiopulmonary process.   Original Report Authenticated By: Harrel Lemon, M.D.    Ct Head Wo Contrast  08/03/2012  *RADIOLOGY REPORT*  Clinical Data: Recent stumbling with several falls.  Garbled speech.  Episode of confusion.  Diabetic hypertensive with hyperlipidemia.  CT HEAD WITHOUT CONTRAST  Technique:  Contiguous axial images were obtained from the base of the skull through the vertex without contrast.  Comparison: 02/07/2011 CT.  Findings: No skull fracture or intracranial hemorrhage.  No CT evidence of large acute infarct.  To exclude small infarct, MR would be  necessary.  No intracranial mass lesion detected on this unenhanced exam.  Exophthalmos.  Partial empty sella.  Partial opacification right sphenoid sinus air cell.  IMPRESSION: No skull fracture or intracranial hemorrhage.  No CT evidence of large acute infarct.  To exclude small infarct, MR would be necessary.  Partial opacification right sphenoid sinus air cell.   Original Report Authenticated By: Fuller Canada, M.D.        MDM  Labs. Monitor. Ecg. Ct.  Called pts pcp as she was seen there today in order to obtain additional hx.  Dr Yetta Barre states office visit was for pain/cp, however pt present w variety of c/o, speech seemed off for pt, trouble  finding words, so sent to ed for further evaluation. He does note hx chronic anxiety and depression.     Date: 08/03/2012  Rate: 78  Rhythm: normal sinus rhythm  QRS Axis: normal  Intervals: normal  ST/T Wave abnormalities: normal  Conduction Disutrbances:none  Narrative Interpretation:   Old EKG Reviewed: unchanged   Delay in troponin, called lab, they will run.  Recheck pt alert, content. Speech fluent.   Pt requests eval low back pain. States had fall x 2 last year, hx ddd, had another fall 2 weeks ago w increased low back pain since. No leg numbness/weakness. Diffuse lumbar tenderness. Spine aligned, no step off.  Pt declines any pain medication currently.   After constant symptoms x 1 day, ecg and trop normal. Prior cardiac cath and stress test neg. Ct neg for cva. Neuro exam non focal.  Pt does have shingles. Continues to decline pain med.    Thumb spica splint re pain/tenderness base thumb. No skin changes or erythema. Normal cap refill distally. Xray w deg change no fx.  Will give rx shingles, low back pain.  Psychiatrist eval re anxiety/depression - telepsych md, penalver, recommends continuing current meds, outpt mental health follow up  - pt  Agreeable w plan.  Recheck speech fluent, no neuro c/o. Pt smiling, content.   Pt amenable to sw consult re possible additional home health services to aid in helping take care of son and spouse. sw called.      Suzi Roots, MD 08/03/12 2042

## 2012-08-03 NOTE — Discharge Instructions (Signed)
For shingles, take valtrex and prednisone as prescribed. You may take vicodin as need for pain. No driving when taking vicodin. Also, do not take tylenol or acetaminophen containing medication when taking vicodin. For thumb, you may wear splint for comfort/support for the next week.  Follow up with hand specialist in 2-3 weeks if symptoms fail to improve/resolve. For back pain, avoid bending at waist or heavy lifting more than 20 lbs for the next week. Try heating pad to sore area.  Follow up with social work, home health services as regards home health aide and/or additional home services. Also follow up with primary care doctor in coming week.  For your mental health, follow up with your psychiatrist/counselor in the next few days. Return to ER if worse, intractable pain, severe depression, one sided loss of sensation or weakness, change in speech or vision, other concern.      Anxiety and Panic Attacks Your caregiver has informed you that you are having an anxiety or panic attack. There may be many forms of this. Most of the time these attacks come suddenly and without warning. They come at any time of day, including periods of sleep, and at any time of life. They may be strong and unexplained. Although panic attacks are very scary, they are physically harmless. Sometimes the cause of your anxiety is not known. Anxiety is a protective mechanism of the body in its fight or flight mechanism. Most of these perceived danger situations are actually nonphysical situations (such as anxiety over losing a job). CAUSES  The causes of an anxiety or panic attack are many. Panic attacks may occur in otherwise healthy people given a certain set of circumstances. There may be a genetic cause for panic attacks. Some medications may also have anxiety as a side effect. SYMPTOMS  Some of the most common feelings are:  Intense terror.   Dizziness, feeling faint.   Hot and cold flashes.   Fear of going crazy.    Feelings that nothing is real.   Sweating.   Shaking.   Chest pain or a fast heartbeat (palpitations).   Smothering, choking sensations.   Feelings of impending doom and that death is near.   Tingling of extremities, this may be from over-breathing.   Altered reality (derealization).   Being detached from yourself (depersonalization).  Several symptoms can be present to make up anxiety or panic attacks. DIAGNOSIS  The evaluation by your caregiver will depend on the type of symptoms you are experiencing. The diagnosis of anxiety or panic attack is made when no physical illness can be determined to be a cause of the symptoms. TREATMENT  Treatment to prevent anxiety and panic attacks may include:  Avoidance of circumstances that cause anxiety.   Reassurance and relaxation.   Regular exercise.   Relaxation therapies, such as yoga.   Psychotherapy with a psychiatrist or therapist.   Avoidance of caffeine, alcohol and illegal drugs.   Prescribed medication.  SEEK IMMEDIATE MEDICAL CARE IF:   You experience panic attack symptoms that are different than your usual symptoms.   You have any worsening or concerning symptoms.  Document Released: 11/30/2005 Document Revised: 11/19/2011 Document Reviewed: 04/03/2010 [facility] Patient Information 2012 [place].     Back Pain, Adult Low back pain is very common. About 1 in 5 people have back pain.The cause of low back pain is rarely dangerous. The pain often gets better over time.About half of people with a sudden onset of back pain feel better in  just 2 weeks. About 8 in 10 people feel better by 6 weeks.  CAUSES Some common causes of back pain include:  Strain of the muscles or ligaments supporting the spine.   Wear and tear (degeneration) of the spinal discs.   Arthritis.   Direct injury to the back.  DIAGNOSIS Most of the time, the direct cause of low back pain is not known.However, back pain can be treated  effectively even when the exact cause of the pain is unknown.Answering your caregiver's questions about your overall health and symptoms is one of the most accurate ways to make sure the cause of your pain is not dangerous. If your caregiver needs more information, he or she may order lab work or imaging tests (X-rays or MRIs).However, even if imaging tests show changes in your back, this usually does not require surgery. HOME CARE INSTRUCTIONS For many people, back pain returns.Since low back pain is rarely dangerous, it is often a condition that people can learn to [facility] their own.   Remain active. It is stressful on the back to sit or stand in one place. Do not sit, drive, or stand in one place for more than 30 minutes at a time. Take short walks on level surfaces as soon as pain allows.Try to increase the length of time you walk each day.   Do not stay in bed.Resting more than 1 or 2 days can delay your recovery.   Do not avoid exercise or work.Your body is made to move.It is not dangerous to be active, even though your back may hurt.Your back will likely heal faster if you return to being active before your pain is gone.   Pay attention to your body when you bend and lift. Many people have less discomfortwhen lifting if they bend their knees, keep the load close to their bodies,and avoid twisting. Often, the most comfortable positions are those that put less stress on your recovering back.   Find a comfortable position to sleep. Use a firm mattress and lie on your side with your knees slightly bent. If you lie on your back, put a pillow under your knees.   Only take over-the-counter or prescription medicines as directed by your caregiver. Over-the-counter medicines to reduce pain and inflammation are often the most helpful.Your caregiver may prescribe muscle relaxant drugs.These medicines help dull your pain so you can more quickly return to your normal activities and healthy  exercise.   Put ice on the injured area.   Put ice in a plastic bag.   Place a towel between your skin and the bag.   Leave the ice on for 15 to 20 minutes, 3 to 4 times a day for the first 2 to 3 days. After that, ice and heat may be alternated to reduce pain and spasms.   Ask your caregiver about trying back exercises and gentle massage. This may be of some benefit.   Avoid feeling anxious or stressed.Stress increases muscle tension and can worsen back pain.It is important to recognize when you are anxious or stressed and learn ways to manage it.Exercise is a great option.  SEEK MEDICAL CARE IF:  You have pain that is not relieved with rest or medicine.   You have pain that does not improve in 1 week.   You have new symptoms.   You are generally not feeling well.  SEEK IMMEDIATE MEDICAL CARE IF:   You have pain that radiates from your back into your legs.   You  develop new bowel or bladder control problems.   You have unusual weakness or numbness in your arms or legs.   You develop nausea or vomiting.   You develop abdominal pain.   You feel faint.  Document Released: 11/30/2005 Document Revised: 11/19/2011 Document Reviewed: 04/20/2011 [facility] Patient Information 2012 [place].     Shingles Shingles is caused by the same virus that causes chickenpox (varicella zoster virus or VZV). Shingles often occurs many years or decades after having chickenpox. That is why it is more common in adults older than 50 years. The virus reactivates and breaks out as an infection in a nerve root. SYMPTOMS   The initial feeling (sensations) may be pain. This pain is usually described as:   Burning.   Stabbing.   Throbbing.   Tingling in the nerve root.   A red rash will follow in a couple days. The rash may occur in any area of the body and is usually on one side (unilateral) of the body in a band or belt-like pattern. The rash usually starts out as very small  blisters (vesicles). They will dry up after 7 to 10 days. This is not usually a significant problem except for the pain it causes.   Long-lasting (chronic) pain is more likely in an elderly person. It can last months to years. This condition is called postherpetic neuralgia.  Shingles can be an extremely severe infection in someone with AIDS, a weakened immune system, or with forms of leukemia. It can also be severe if you are taking transplant medicines or other medicines that weaken the immune system. TREATMENT  Your caregiver will often treat you with:  Antiviral drugs.   Anti-inflammatory drugs.   Pain medicines.  Bed rest is very important in preventing the pain associated with herpes zoster (postherpetic neuralgia). Application of heat in the form of a hot water bottle or electric heating pad or gentle pressure with the hand is recommended to help with the pain or discomfort. PREVENTION  A varicella zoster vaccine is available to help protect against the virus. The Food and Drug Administration approved the varicella zoster vaccine for individuals 60 years of age and older. HOME CARE INSTRUCTIONS   Cool compresses to the area of rash may be helpful.   Only take over-the-counter or prescription medicines for pain, discomfort, or fever as directed by your caregiver.   Avoid contact with:   Babies.   Pregnant women.   Children with eczema.   Elderly people with transplants.   People with chronic illnesses, such as leukemia and AIDS.   If the area involved is on your face, you may receive a referral for follow-up to a specialist. It is very important to keep all follow-up appointments. This will help avoid eye complications, chronic pain, or disability.  SEEK IMMEDIATE MEDICAL CARE IF:   You develop any pain (headache) in the area of the face or eye. This must be followed carefully by your caregiver or ophthalmologist. An infection in part of your eye (cornea) can be very  serious. It could lead to blindness.   You do not have pain relief from prescribed medicines.   Your redness or swelling spreads.   The area involved becomes very swollen and painful.   You have a fever.   You notice any red or painful lines extending away from the affected area toward your heart (lymphangitis).   Your condition is worsening or has changed.  Document Released: 11/30/2005 Document Revised: 11/19/2011 Document Reviewed:  11/04/2009 ExitCare Patient Information 2012 [place].     Depression, Adolescent and Adult Depression is a true and treatable medical condition. In general there are two kinds of depression:  Depression we all experience in some form. For example depression from the death of a loved one, financial distress or natural disasters will trigger or increase depression.   Clinical depression, on the other hand, appears without an apparent cause or reason. This depression is a disease. Depression may be caused by chemical imbalance in the body and brain or may come as a response to a physical illness. Alcohol and other drugs can cause depression.  DIAGNOSIS  The diagnosis of depression is usually based upon symptoms and medical history. TREATMENT  Treatments for depression fall into three categories. These are:  Drug therapy. There are many medicines that treat depression. Responses may vary and sometimes trial and error is necessary to determine the best medicines and dosage for a particular patient.   Psychotherapy, also called talking treatments, helps people resolve their problems by looking at them from a different point of view and by giving people insight into their own personal makeup. Traditional psychotherapy looks at a childhood source of a problem. Other psychotherapy will look at current conflicts and move toward solving those. If the cause of depression is drug use, counseling is available to help abstain. In time the depression will  usually improve. If there were underlying causes for the chemical use, they can be addressed.   ECT (electroconvulsive therapy) or shock treatment is not as commonly used today. It is a very effective treatment for severe suicidal depression. During ECT electrical impulses are applied to the head. These impulses cause a generalized seizure. It can be effective but causes a loss of memory for recent events. Sometimes this loss of memory may include the last several months.  Treat all depression or suicide threats as serious. Obtain professional help. Do not wait to see if serious depression will get better over time without help. Seek help for yourself or those around you. In the [place] the number to the [facility] With 24 Hour Help Are: 1-800-SUICIDE 856-121-7960 Document Released: 11/27/2000 Document Revised: 11/19/2011 Document Reviewed: 07/18/2008 [facility] Patient Information 2012 [place], [place].     Thumb Sprain Your exam shows you have a sprained thumb. This means the ligaments around the joint have been torn. Thumb sprains usually take 3-6 weeks to heal. However, severe, unstable sprains may need to be fixed surgically. Sometimes a small piece of bone is pulled off by the ligament. If this is not treated properly, a sprained thumb can lead to a painful, weak joint. Treatment helps reduce pain and shortens the period of disability. The thumb, and often the wrist, must remain splinted for the first 2-4 weeks to protect the joint. Keep your hand elevated and apply ice packs frequently to the injured area (20-30 minutes every 2-3 hours) for the next 2-4 days. This helps reduce swelling and control pain. Pain medicine may also be used for several days. Motion and strengthening exercises may later be prescribed for the joint to return to normal function. Be sure to see your doctor for follow-up because your thumb joint may require further support with splints, bandages or tape.  Please see your doctor or go to the emergency room right away if you have increased pain despite proper treatment, or a numb, cold, or pale thumb. Document Released: 01/07/2005 Document Revised: 11/19/2011 Document Reviewed: 12/01/2008 ExitCare Patient Information 2012  [employer].      RESOURCE GUIDE  Chronic Pain Problems: Contact [facility]  [phone] Patients need to be referred by their primary care doctor.  Insufficient Money for Medicine: Contact [employer]:  call "211" or [place] [phone].  No Primary Care Doctor: - Call [facility] Connect  [phone] - can help you locate a primary care doctor that  accepts your insurance, provides certain services, etc. - Physician Referral Service- [phone]  Agencies that provide inexpensive medical care: - [facility]  [facility]  [phone] - [facility]  [phone] - [facility]  [phone] - Planned Parenthood  [phone] - [facility]  [phone]  [place] Providers: - [facility]- [address], [address]  [phone], Mon-Fri 9am-7pm, Sat 9am-1pm - [place], [address] [place]  [place], [address] [place]  [place]- [address]  [place], [address], [phone]  Only accepts [place] patients after they have their name  applied to their card  Self Pay (no insurance) in [place]: - Sickle Cell Patients: Dr Kevan Ny, [facility]  [place], [facility]- [place]  [facility]- [id number] [place] [address], [facility]- see information above (Speak to [employer] if you do not have  insurance)       -  [facility]- [place], [place],  [place], [place]  Dr Vista Lawman-  [address], [place], [facility]- [address] [phone]       -  [facility] [place]- [address], [place], also [address] [id number]       -    [facility]- [address], [place], 1st & 3rd Saturday   every month, 10am-1pm  1) Find a Doctor and Pay Out of Pocket Although you won't have to find out who is covered by your insurance plan, it is a good idea to ask around and get recommendations. You will then need to call the office and see if the doctor you have chosen will accept you as a new patient and what types of options they offer for patients who are self-pay. Some doctors offer discounts or will set up payment plans for their patients who do not have insurance, but you will need to ask so you aren't surprised when you get to your appointment.  2) Contact Your Local Health Department Not all health departments have doctors that can see patients for sick visits, but many do, so it is worth a call to see if yours does. If you don't know where your local health department is, you can check in your phone book. The CDC also has a tool to help you locate your state's health  department, and many state websites also have listings of all of their local health departments.  3) Find a [facility] If your illness is not likely to be very severe or complicated, you may want to try a walk in clinic. These are popping up all over the country in pharmacies, drugstores, and shopping centers. They're usually staffed by nurse practitioners or physician assistants that have been trained to treat common illnesses and complaints. They're usually fairly quick and inexpensive. However, if you have serious  medical issues or chronic medical problems, these are probably not your best option  STD Testing - [place], [facility], [address] [place], phone [phone] or [phone].  Monday - Friday, call for an appointment. - [place], [facility], [address], [place], phone [phone] or [phone].  Monday - Friday, call for an appointment.  Abuse/Neglect: - [place] [phone] - [place] [phone] (After Hours)  Emergency Shelter:  [facility] [phone]  Maternity Homes: - [facility] [place] [phone] - [place] [phone]  MRSA Hotline #:   702-495-1234  [facility]. [address]         [place] Phone:  [phone]                                  Phone:  [phone]                   Phone:  [phone]  [place], [place] in [place], [address],                                  [place] [phone] or [phone] (After Hours)   [place]  Substance Abuse Resources: - Alcohol and Drug Services  [phone] - [place] [phone] - [facility] [place] [phone] - Residential & Outpatient Substance Abuse Program  [phone]  Psychological Services: - [facility]  [place]  [place], [phone] [place]. [address] [place], [place]: [phone] or [phone], [employer]  Dental Assistance  If unable to pay or uninsured, contact:  [facility] or [facility]. to become qualified for the adult dental clinic.  Patients with Medicaid: [facility] [phone] [address], [place]  [place], [phone]  If unable to pay, or uninsured, contact [facility] [phone]) or [place] [phone] in [place], [place] in [facility]) to become qualified for the adult dental clinic  Other [place]- [place], [place], [place], [address], [place], [place], 2nd and 4th Thursday of the month at 6:30am.  10 clients each day by appointment, can sometimes see walk-in patients if someone does not show for an appointment. [facility]- [address] [place]ard D[place]er [place], [place], [address], [place], [place], [place], [address], [place] Department- [place] Department- [place] Department- [phone]

## 2012-08-03 NOTE — ED Notes (Addendum)
Patient sent from Dr. Isidore Moos today for epidsode of slurred speech and unable to get words out when speaking, Per EMS patient symptoms resolved enroute, but now patient symptoms returning, patient family states symptoms have been occurring x 1 week.  Patient also c/o chest pain and states feels like a lot of pressure on top of chest

## 2012-08-03 NOTE — Assessment & Plan Note (Signed)
She is having s/s c/w a CVA so EMS was called and she was transported emergently to the ER

## 2012-08-05 ENCOUNTER — Ambulatory Visit: Payer: Medicare Other | Admitting: Internal Medicine

## 2012-08-19 ENCOUNTER — Ambulatory Visit (INDEPENDENT_AMBULATORY_CARE_PROVIDER_SITE_OTHER): Payer: Medicare Other | Admitting: Internal Medicine

## 2012-08-19 ENCOUNTER — Encounter: Payer: Self-pay | Admitting: Internal Medicine

## 2012-08-19 ENCOUNTER — Other Ambulatory Visit (INDEPENDENT_AMBULATORY_CARE_PROVIDER_SITE_OTHER): Payer: Medicare Other

## 2012-08-19 VITALS — BP 120/68 | HR 86 | Temp 98.4°F | Resp 16 | Wt 280.0 lb

## 2012-08-19 DIAGNOSIS — F329 Major depressive disorder, single episode, unspecified: Secondary | ICD-10-CM

## 2012-08-19 DIAGNOSIS — D509 Iron deficiency anemia, unspecified: Secondary | ICD-10-CM

## 2012-08-19 DIAGNOSIS — E039 Hypothyroidism, unspecified: Secondary | ICD-10-CM

## 2012-08-19 DIAGNOSIS — I10 Essential (primary) hypertension: Secondary | ICD-10-CM

## 2012-08-19 DIAGNOSIS — E119 Type 2 diabetes mellitus without complications: Secondary | ICD-10-CM

## 2012-08-19 DIAGNOSIS — R413 Other amnesia: Secondary | ICD-10-CM

## 2012-08-19 DIAGNOSIS — F3289 Other specified depressive episodes: Secondary | ICD-10-CM

## 2012-08-19 DIAGNOSIS — E785 Hyperlipidemia, unspecified: Secondary | ICD-10-CM

## 2012-08-19 DIAGNOSIS — Z23 Encounter for immunization: Secondary | ICD-10-CM

## 2012-08-19 LAB — CBC WITH DIFFERENTIAL/PLATELET
Basophils Absolute: 0 10*3/uL (ref 0.0–0.1)
Basophils Relative: 0.2 % (ref 0.0–3.0)
Eosinophils Absolute: 0.3 10*3/uL (ref 0.0–0.7)
Eosinophils Relative: 2.2 % (ref 0.0–5.0)
HCT: 34.2 % — ABNORMAL LOW (ref 36.0–46.0)
Hemoglobin: 11.1 g/dL — ABNORMAL LOW (ref 12.0–15.0)
Lymphocytes Relative: 20.7 % (ref 12.0–46.0)
Lymphs Abs: 2.6 10*3/uL (ref 0.7–4.0)
MCHC: 32.6 g/dL (ref 30.0–36.0)
MCV: 85.9 fl (ref 78.0–100.0)
Monocytes Absolute: 0.9 10*3/uL (ref 0.1–1.0)
Monocytes Relative: 7 % (ref 3.0–12.0)
Neutro Abs: 8.9 10*3/uL — ABNORMAL HIGH (ref 1.4–7.7)
Neutrophils Relative %: 69.9 % (ref 43.0–77.0)
Platelets: 321 10*3/uL (ref 150.0–400.0)
RBC: 3.98 Mil/uL (ref 3.87–5.11)
RDW: 18.9 % — ABNORMAL HIGH (ref 11.5–14.6)
WBC: 12.8 10*3/uL — ABNORMAL HIGH (ref 4.5–10.5)

## 2012-08-19 LAB — HEMOGLOBIN A1C: Hgb A1c MFr Bld: 6.7 % — ABNORMAL HIGH (ref 4.6–6.5)

## 2012-08-19 LAB — IBC PANEL
Iron: 73 ug/dL (ref 42–145)
Saturation Ratios: 16.2 % — ABNORMAL LOW (ref 20.0–50.0)
Transferrin: 321 mg/dL (ref 212.0–360.0)

## 2012-08-19 LAB — TSH: TSH: 2.11 u[IU]/mL (ref 0.35–5.50)

## 2012-08-19 LAB — FERRITIN: Ferritin: 27.3 ng/mL (ref 10.0–291.0)

## 2012-08-19 MED ORDER — PIOGLITAZONE HCL 30 MG PO TABS: 30.0000 mg | ORAL_TABLET | Freq: Every day | ORAL | Status: DC

## 2012-08-19 MED ORDER — GLUCOSE BLOOD VI STRP: ORAL_STRIP | Status: DC

## 2012-08-19 MED ORDER — AMLODIPINE BESYLATE-VALSARTAN 10-320 MG PO TABS: 1.0000 | ORAL_TABLET | Freq: Every day | ORAL | Status: DC

## 2012-08-19 MED ORDER — OMEPRAZOLE 40 MG PO CPDR: 40.0000 mg | DELAYED_RELEASE_CAPSULE | Freq: Every day | ORAL | Status: DC

## 2012-08-19 NOTE — Assessment & Plan Note (Signed)
I will check her a1c today 

## 2012-08-19 NOTE — Patient Instructions (Signed)

## 2012-08-19 NOTE — Assessment & Plan Note (Signed)
I will check her TSH today and will adjust her dose if needed 

## 2012-08-19 NOTE — Assessment & Plan Note (Signed)
No changes today

## 2012-08-19 NOTE — [place] Notes (Signed)
Subjective:    Patient ID: Valerie [name], female    DOB: [birth year], 66 y.o.   MRN: [id number]  Thyroid Problem Presents for follow-up visit. Symptoms include anxiety, depressed mood and fatigue. Patient reports no cold intolerance, constipation, diaphoresis, diarrhea, dry skin, hair loss, heat intolerance, hoarse voice, leg swelling, menstrual problem, nail problem, palpitations, tremors, visual change, weight gain or weight loss. The symptoms have been stable.      Review of Systems  Constitutional: Positive for fatigue. Negative for fever, chills, weight loss, weight gain, diaphoresis, activity change, appetite change and unexpected weight change.  HENT: Negative.  Negative for hoarse voice.   Eyes: Negative.   Respiratory: Negative for apnea, cough, chest tightness, shortness of breath, wheezing and stridor.   Cardiovascular: Negative for chest pain, palpitations and leg swelling.  Gastrointestinal: Negative for nausea, vomiting, abdominal pain, diarrhea, constipation and blood in stool.  Genitourinary: Negative for dysuria, urgency, frequency, flank pain, difficulty urinating, menstrual problem and dyspareunia.  Musculoskeletal: Positive for back pain (chronic, unchanged). Negative for myalgias and arthralgias.  Skin: Negative.   Neurological: Negative for dizziness, tremors, seizures, syncope, facial asymmetry, speech difficulty, weakness, light-headedness, numbness and headaches.  Hematological: Negative for cold intolerance, heat intolerance and adenopathy. Does not bruise/bleed easily.  Psychiatric/Behavioral: Positive for dysphoric mood and decreased concentration. Negative for suicidal ideas, hallucinations, behavioral problems, confusion, disturbed wake/sleep cycle, self-injury and agitation. The patient is nervous/anxious. The patient is not hyperactive.        Objective:   Physical Exam  Vitals reviewed. Constitutional: She is oriented to person, place, and time. She  appears well-developed and well-nourished. No distress.  HENT:  Head: Normocephalic and atraumatic.  Mouth/Throat: Oropharynx is clear and moist. No oropharyngeal exudate.  Eyes: Conjunctivae are normal. Right eye exhibits no discharge. Left eye exhibits no discharge. No scleral icterus.  Neck: Normal range of motion. Neck supple. No JVD present. No tracheal deviation present. No thyromegaly present.  Cardiovascular: Normal rate, regular rhythm, normal heart sounds and intact distal pulses.  Exam reveals no gallop and no friction rub.   No murmur heard. Pulmonary/Chest: Effort normal and breath sounds normal. No stridor. No respiratory distress. She has no wheezes. She has no rales. She exhibits no tenderness.  Abdominal: Soft. Bowel sounds are normal. She exhibits no distension and no mass. There is no tenderness. There is no rebound and no guarding.  Musculoskeletal: Normal range of motion. She exhibits no edema and no tenderness.  Lymphadenopathy:    She has no cervical adenopathy.  Neurological: She is alert and oriented to person, place, and time. She has normal reflexes. She displays normal reflexes. No cranial nerve deficit. She exhibits normal muscle tone. Coordination normal.  Skin: Skin is warm and dry. No rash noted. She is not diaphoretic. No erythema. No pallor.  Psychiatric: She has a normal mood and affect. Her behavior is normal. Judgment and thought content normal.      Lab Results  Component Value Date   WBC 9.1 08/03/2012   HGB 10.9* 08/03/2012   HCT 34.5* 08/03/2012   PLT 319 08/03/2012   GLUCOSE 105* 08/03/2012   CHOL 200 03/21/2012   TRIG 286.0* 03/21/2012   HDL 51.70 03/21/2012   LDLDIRECT 104.3 03/21/2012   ALT 21 08/03/2012   AST 24 08/03/2012   NA 143 08/03/2012   K 3.8 08/03/2012   CL 107 08/03/2012   CREATININE 0.70 08/03/2012   BUN 7 08/03/2012   CO2 25 08/03/2012   TSH 1.63 03/21/2012  INR 0.97 07/12/2010   HGBA1C 6.5 03/21/2012   MICROALBUR 3.2* 10/17/2010        Assessment & Plan:

## 2012-08-19 NOTE — Assessment & Plan Note (Signed)
When I last saw her she had TIA s/s but after being in the ER she ruled out for CVA, she has persistent worsening memory issues which may be related to stressors, meds, dementia, depression.anxiety. I will check her labs today and will see if her lamictal level is high, also have asked her to see neurology

## 2012-08-19 NOTE — Assessment & Plan Note (Signed)
Her BP is well controlled, I will check her lytes and renal function 

## 2012-08-19 NOTE — Assessment & Plan Note (Signed)
CBC and iron levels today 

## 2012-08-20 LAB — LAMOTRIGINE LEVEL: Lamotrigine Lvl: 2.7 ug/mL — ABNORMAL LOW (ref 3.0–14.0)

## 2012-08-26 ENCOUNTER — Telehealth: Payer: Self-pay

## 2012-08-26 NOTE — Telephone Encounter (Signed)
Pt called requesting the status of PA for [employer]? Please advise.

## 2012-08-26 NOTE — Telephone Encounter (Signed)
No PA form received or rejection from pharmacy stating this is need.

## 2012-08-30 ENCOUNTER — Telehealth: Payer: Self-pay | Admitting: Internal Medicine

## 2012-08-30 NOTE — Telephone Encounter (Signed)
The labs are fairly stable on 9/6. Lamictal level was low. F/u w/Dr Geralyn Flash

## 2012-08-30 NOTE — Telephone Encounter (Signed)
Form printed/completed, and faxed back to insurance company for processing

## 2012-08-30 NOTE — Telephone Encounter (Signed)
Please advise on behalf of Dr Yetta Barre Thanks

## 2012-08-30 NOTE — Telephone Encounter (Signed)
Patient is calling to get the results of her labs.

## 2012-08-31 NOTE — Telephone Encounter (Signed)
Patient notified

## 2012-09-05 ENCOUNTER — Encounter: Payer: Self-pay | Admitting: Gastroenterology

## 2012-09-12 ENCOUNTER — Ambulatory Visit (INDEPENDENT_AMBULATORY_CARE_PROVIDER_SITE_OTHER): Payer: Medicare Other | Admitting: Internal Medicine

## 2012-09-12 ENCOUNTER — Encounter: Payer: Self-pay | Admitting: Internal Medicine

## 2012-09-12 ENCOUNTER — Ambulatory Visit (INDEPENDENT_AMBULATORY_CARE_PROVIDER_SITE_OTHER)
Admission: RE | Admit: 2012-09-12 | Discharge: 2012-09-12 | Disposition: A | Discharge status: Home / Self Care | Payer: Medicare Other | Source: Ambulatory Visit | Attending: Internal Medicine | Admitting: Internal Medicine

## 2012-09-12 VITALS — BP 124/80 | HR 80 | Temp 98.3°F | Resp 16 | Ht 66.0 in | Wt 284.5 lb

## 2012-09-12 DIAGNOSIS — I10 Essential (primary) hypertension: Secondary | ICD-10-CM

## 2012-09-12 DIAGNOSIS — E669 Obesity, unspecified: Secondary | ICD-10-CM

## 2012-09-12 DIAGNOSIS — M549 Dorsalgia, unspecified: Secondary | ICD-10-CM

## 2012-09-12 MED ORDER — LORCASERIN HCL 10 MG PO TABS: 1.0000 | ORAL_TABLET | Freq: Two times a day (BID) | ORAL | Status: DC

## 2012-09-12 MED ORDER — AMLODIPINE BESYLATE 10 MG PO TABS: 10.0000 mg | ORAL_TABLET | Freq: Every day | ORAL | Status: DC

## 2012-09-12 MED ORDER — VALSARTAN 320 MG PO TABS: 320.0000 mg | ORAL_TABLET | Freq: Every day | ORAL | Status: DC

## 2012-09-12 NOTE — Assessment & Plan Note (Signed)
Her BP is well controlled 

## 2012-09-12 NOTE — Assessment & Plan Note (Signed)
I will check a plain film to see if she has a fracture or subluxation, she will continue with otc symptom relief

## 2012-09-12 NOTE — Progress Notes (Signed)
Subjective:    Patient ID: Valerie [name], female    DOB: Jan 13, 1946, 66 y.o.   MRN: [id number]  Back Pain This is a recurrent problem. The current episode started 1 to 4 weeks ago. The problem occurs constantly. The problem has been gradually worsening since onset. The pain is present in the lumbar spine. The quality of the pain is described as aching. The pain radiates to the left thigh. The pain is at a severity of 2/10. The pain is mild. The pain is worse during the day. The symptoms are aggravated by bending and standing. Stiffness is present all day. Pertinent negatives include no abdominal pain, bladder incontinence, bowel incontinence, chest pain, dysuria, fever, headaches, leg pain, numbness, paresis, paresthesias, pelvic pain, perianal numbness, tingling, weakness or weight loss. Risk factors include recent trauma (she fell 2 weeks ago). She has tried analgesics for the symptoms. The treatment provided moderate relief.      Review of Systems  Constitutional: Positive for unexpected weight change (weight gain). Negative for fever, chills, weight loss, diaphoresis, activity change, appetite change and fatigue.  HENT: Negative.   Eyes: Negative.   Respiratory: Negative for cough, chest tightness, shortness of breath, wheezing and stridor.   Cardiovascular: Negative for chest pain, palpitations and leg swelling.  Gastrointestinal: Negative.  Negative for abdominal pain and bowel incontinence.  Genitourinary: Negative.  Negative for bladder incontinence, dysuria and pelvic pain.  Musculoskeletal: Positive for back pain. Negative for myalgias, joint swelling, arthralgias and gait problem.  Skin: Negative for color change, pallor, rash and wound.  Neurological: Negative.  Negative for tingling, weakness, numbness, headaches and paresthesias.  Hematological: Negative for adenopathy. Does not bruise/bleed easily.  Psychiatric/Behavioral: Negative.        Objective:   Physical Exam    Vitals reviewed. Constitutional: She is oriented to person, place, and time. She appears well-developed and well-nourished. No distress.  HENT:  Head: Normocephalic and atraumatic.  Mouth/Throat: Oropharynx is clear and moist. No oropharyngeal exudate.  Eyes: Conjunctivae normal are normal. Right eye exhibits no discharge. Left eye exhibits no discharge. No scleral icterus.  Neck: Normal range of motion. Neck supple. No JVD present. No tracheal deviation present. No thyromegaly present.  Cardiovascular: Normal rate, regular rhythm, normal heart sounds and intact distal pulses.  Exam reveals no gallop and no friction rub.   No murmur heard. Pulmonary/Chest: Effort normal and breath sounds normal. No stridor. No respiratory distress. She has no wheezes. She has no rales. She exhibits no tenderness.  Abdominal: Soft. Bowel sounds are normal. She exhibits no distension and no mass. There is no tenderness. There is no rebound and no guarding.  Musculoskeletal: Normal range of motion. She exhibits no edema and no tenderness.       Lumbar back: Normal. She exhibits normal range of motion, no tenderness, no bony tenderness, no swelling, no edema, no deformity, no pain and no spasm.  Lymphadenopathy:    She has no cervical adenopathy.  Neurological: She is alert and oriented to person, place, and time. She has normal strength. She displays no atrophy, no tremor and normal reflexes. No cranial nerve deficit or sensory deficit. She exhibits normal muscle tone. She displays a negative Romberg sign. She displays no seizure activity. Coordination and gait normal. She displays no Babinski's sign on the right side. She displays no Babinski's sign on the left side.  Reflex Scores:      Tricep reflexes are 1+ on the right side and 1+ on the left side.  Bicep reflexes are 1+ on the right side and 1+ on the left side.      Brachioradialis reflexes are 1+ on the right side and 1+ on the left side.      Patellar  reflexes are 1+ on the right side and 1+ on the left side.      Achilles reflexes are 1+ on the right side and 1+ on the left side. Skin: Skin is warm and dry. No rash noted. She is not diaphoretic. No erythema. No pallor.  Psychiatric: She has a normal mood and affect. Her behavior is normal. Judgment and thought content normal.      Lab Results  Component Value Date   WBC 12.8* 08/19/2012   HGB 11.1* 08/19/2012   HCT 34.2* 08/19/2012   PLT 321.0 08/19/2012   GLUCOSE 105* 08/03/2012   CHOL 200 03/21/2012   TRIG 286.0* 03/21/2012   HDL 51.70 03/21/2012   LDLDIRECT 104.3 03/21/2012   ALT 21 08/03/2012   AST 24 08/03/2012   NA 143 08/03/2012   K 3.8 08/03/2012   CL 107 08/03/2012   CREATININE 0.70 08/03/2012   BUN 7 08/03/2012   CO2 25 08/03/2012   TSH 2.11 08/19/2012   INR 0.97 07/12/2010   HGBA1C 6.7* 08/19/2012   MICROALBUR 3.2* 10/17/2010      Assessment & Plan:

## 2012-09-12 NOTE — Assessment & Plan Note (Signed)
She will try belviq to help her control her appetite and lose weight

## 2012-09-12 NOTE — Patient Instructions (Signed)
Back Pain, Adult Low back pain is very common. About 1 in 5 people have back pain.The cause of low back pain is rarely dangerous. The pain often gets better over time.About half of people with a sudden onset of back pain feel better in just 2 weeks. About 8 in 10 people feel better by 6 weeks.  CAUSES Some common causes of back pain include:  Strain of the muscles or ligaments supporting the spine.   Wear and tear (degeneration) of the spinal discs.   Arthritis.   Direct injury to the back.  DIAGNOSIS Most of the time, the direct cause of low back pain is not known.However, back pain can be treated effectively even when the exact cause of the pain is unknown.Answering your caregiver's questions about your overall health and symptoms is one of the most accurate ways to make sure the cause of your pain is not dangerous. If your caregiver needs more information, he or she may order lab work or imaging tests (X-rays or MRIs).However, even if imaging tests show changes in your back, this usually does not require surgery. HOME CARE INSTRUCTIONS For many people, back pain returns.Since low back pain is rarely dangerous, it is often a condition that people can learn to manageon their own.   Remain active. It is stressful on the back to sit or stand in one place. Do not sit, drive, or stand in one place for more than 30 minutes at a time. Take short walks on level surfaces as soon as pain allows.Try to increase the length of time you walk each day.   Do not stay in bed.Resting more than 1 or 2 days can delay your recovery.   Do not avoid exercise or work.Your body is made to move.It is not dangerous to be active, even though your back may hurt.Your back will likely heal faster if you return to being active before your pain is gone.   Pay attention to your body when you bend and lift. Many people have less discomfortwhen lifting if they bend their knees, keep the load close to their  bodies,and avoid twisting. Often, the most comfortable positions are those that put less stress on your recovering back.   Find a comfortable position to sleep. Use a firm mattress and lie on your side with your knees slightly bent. If you lie on your back, put a pillow under your knees.   Only take over-the-counter or prescription medicines as directed by your caregiver. Over-the-counter medicines to reduce pain and inflammation are often the most helpful.Your caregiver may prescribe muscle relaxant drugs.These medicines help dull your pain so you can more quickly return to your normal activities and healthy exercise.   Put ice on the injured area.   Put ice in a plastic bag.   Place a towel between your skin and the bag.   Leave the ice on for 15 to 20 minutes, 3 to 4 times a day for the first 2 to 3 days. After that, ice and heat may be alternated to reduce pain and spasms.   Ask your caregiver about trying back exercises and gentle massage. This may be of some benefit.   Avoid feeling anxious or stressed.Stress increases muscle tension and can worsen back pain.It is important to recognize when you are anxious or stressed and learn ways to manage it.Exercise is a great option.  SEEK MEDICAL CARE IF:  You have pain that is not relieved with rest or medicine.   You have   pain that does not improve in 1 week.   You have new symptoms.   You are generally not feeling well.  SEEK IMMEDIATE MEDICAL CARE IF:   You have pain that radiates from your back into your legs.   You develop new bowel or bladder control problems.   You have unusual weakness or numbness in your arms or legs.   You develop nausea or vomiting.   You develop abdominal pain.   You feel faint.  Document Released: 11/30/2005 Document Revised: 11/19/2011 Document Reviewed: 04/20/2011 [employer] Patient Information 2012 [employer]. 

## 2012-09-14 ENCOUNTER — Telehealth: Payer: Self-pay

## 2012-09-14 DIAGNOSIS — M549 Dorsalgia, unspecified: Secondary | ICD-10-CM

## 2012-09-14 NOTE — Telephone Encounter (Signed)
Patient notified per MD.

## 2012-09-14 NOTE — Telephone Encounter (Signed)
Pt called requesting results of xray done 09/30, states she is still experiencing back pain.

## 2012-09-14 NOTE — Telephone Encounter (Signed)
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

## 2012-09-20 ENCOUNTER — Other Ambulatory Visit: Payer: Self-pay | Admitting: Neurology

## 2012-09-20 DIAGNOSIS — F09 Unspecified mental disorder due to known physiological condition: Secondary | ICD-10-CM

## 2012-09-27 LAB — HM DIABETES EYE EXAM: HM Diabetic Eye Exam: NORMAL

## 2012-09-28 ENCOUNTER — Other Ambulatory Visit: Payer: Medicare Other

## 2012-09-28 ENCOUNTER — Emergency Department (HOSPITAL_COMMUNITY)
Admission: EM | Admit: 2012-09-28 | Discharge: 2012-09-28 | Disposition: A | Discharge status: Home / Self Care | Payer: Medicare Other | Attending: Emergency Medicine | Admitting: Emergency Medicine

## 2012-09-28 ENCOUNTER — Encounter (HOSPITAL_COMMUNITY): Payer: Self-pay | Admitting: Family Medicine

## 2012-09-28 DIAGNOSIS — G4730 Sleep apnea, unspecified: Secondary | ICD-10-CM | POA: Insufficient documentation

## 2012-09-28 DIAGNOSIS — IMO0002 Reserved for concepts with insufficient information to code with codable children: Secondary | ICD-10-CM | POA: Insufficient documentation

## 2012-09-28 DIAGNOSIS — Z880 Allergy status to penicillin: Secondary | ICD-10-CM | POA: Insufficient documentation

## 2012-09-28 DIAGNOSIS — E785 Hyperlipidemia, unspecified: Secondary | ICD-10-CM | POA: Insufficient documentation

## 2012-09-28 DIAGNOSIS — J45909 Unspecified asthma, uncomplicated: Secondary | ICD-10-CM | POA: Insufficient documentation

## 2012-09-28 DIAGNOSIS — F3289 Other specified depressive episodes: Secondary | ICD-10-CM | POA: Insufficient documentation

## 2012-09-28 DIAGNOSIS — I10 Essential (primary) hypertension: Secondary | ICD-10-CM | POA: Insufficient documentation

## 2012-09-28 DIAGNOSIS — M5416 Radiculopathy, lumbar region: Secondary | ICD-10-CM

## 2012-09-28 DIAGNOSIS — K219 Gastro-esophageal reflux disease without esophagitis: Secondary | ICD-10-CM | POA: Insufficient documentation

## 2012-09-28 DIAGNOSIS — E119 Type 2 diabetes mellitus without complications: Secondary | ICD-10-CM | POA: Insufficient documentation

## 2012-09-28 DIAGNOSIS — F329 Major depressive disorder, single episode, unspecified: Secondary | ICD-10-CM | POA: Insufficient documentation

## 2012-09-28 LAB — URINALYSIS, ROUTINE W REFLEX MICROSCOPIC
Bilirubin Urine: NEGATIVE
Glucose, UA: NEGATIVE mg/dL
Hgb urine dipstick: NEGATIVE
Ketones, ur: NEGATIVE mg/dL
Leukocytes, UA: NEGATIVE
Nitrite: NEGATIVE
Protein, ur: NEGATIVE mg/dL
Specific Gravity, Urine: 1.009 (ref 1.005–1.030)
Urobilinogen, UA: 0.2 mg/dL (ref 0.0–1.0)
pH: 7 (ref 5.0–8.0)

## 2012-09-28 MED ORDER — PREDNISONE 10 MG PO TABS: ORAL_TABLET | ORAL | Status: DC

## 2012-09-28 MED ORDER — HYDROMORPHONE HCL PF 1 MG/ML IJ SOLN
1.0000 mg | Freq: Once | INTRAMUSCULAR | Status: DC
  Filled 2012-09-28: qty 1

## 2012-09-28 MED ORDER — OXYCODONE-ACETAMINOPHEN 5-325 MG PO TABS: 1.0000 | ORAL_TABLET | ORAL | Status: DC | PRN

## 2012-09-28 MED ORDER — KETOROLAC TROMETHAMINE 60 MG/2ML IM SOLN
60.0000 mg | Freq: Once | INTRAMUSCULAR | Status: AC
  Administered 2012-09-28: 60 mg via INTRAMUSCULAR
  Filled 2012-09-28: qty 2

## 2012-09-28 MED ORDER — OXYCODONE-ACETAMINOPHEN 5-325 MG PO TABS
1.0000 | ORAL_TABLET | Freq: Once | ORAL | Status: DC
  Filled 2012-09-28: qty 1

## 2012-09-28 NOTE — ED Provider Notes (Signed)
History     CSN: [id number]  Arrival date & time 09/28/12  1134   First MD Initiated Contact with Patient 09/28/12 1228      Chief Complaint  Patient presents with  . Back Pain    (Consider location/radiation/quality/duration/timing/severity/associated sxs/prior treatment) HPI Comments: Pt states she has had back pain on and off for several years since her fall. States pain worsened two days ago after she has lifted, pushed, and pulled some heavy things. States pain worsened yesterday, took ibuprofen with no relief. States this morning, could not walk without assistance. Stats pain in left lower back, going down left thigh. Denies weakness or numbness in legs. No loss of bowels, urinary or bowel incontinence or retention. No fever. Admits to some burning with urination, no frequency or urgency.  The history is provided by the patient.    Past Medical History  Diagnosis Date  . Anemia   . Depression   . Diabetes mellitus, type 2   . GERD (gastroesophageal reflux disease)   . Hyperlipidemia   . Hypertension   . Sleep apnea   . Asthma   . Hemorrhoids     Past Surgical History  Procedure Date  . Benign tumors resected   . Tubal ligation     Family History  Problem Relation Age of Onset  . Hypertension      family history  . Alcohol abuse    . Alcohol abuse Mother   . Heart attack Mother   . Coronary artery disease Brother     History  Substance Use Topics  . Smoking status: Never Smoker   . Smokeless tobacco: Not on file  . Alcohol Use: No    OB History    Grav Para Term Preterm Abortions TAB SAB Ect Mult Living                  Review of Systems  Gastrointestinal: Negative for abdominal pain.  Musculoskeletal: Positive for back pain.  Neurological: Negative for weakness and numbness.  All other systems reviewed and are negative.    Allergies  Food and Penicillins  Home Medications   Current Outpatient Rx  Name Route Sig Dispense Refill  .  ALBUTEROL SULFATE HFA 108 (90 BASE) MCG/ACT IN AERS Inhalation Inhale 2 puffs into the lungs every 6 (six) hours as needed. For shortness of breath    . ALPRAZOLAM 2 MG PO TABS Oral Take 2 mg by mouth 3 (three) times daily as needed. For anxiety    . AMLODIPINE BESYLATE-VALSARTAN 10-320 MG PO TABS Oral Take 1 tablet by mouth daily.    [name] CARVEDILOL 25 MG PO TABS Oral Take 25 mg by mouth 2 (two) times daily with a meal.    . FLUOXETINE HCL 40 MG PO CAPS Oral Take 40 mg by mouth daily.    [name] LAMOTRIGINE 200 MG PO TABS Oral Take 200 mg by mouth daily.     [name] LATANOPROST 0.005 % OP SOLN Both Eyes Place 1 drop into both eyes at bedtime.    [name] MONTELUKAST SODIUM 10 MG PO TABS Oral Take 10 mg by mouth at bedtime.      . OMEPRAZOLE 40 MG PO CPDR Oral Take 1 capsule (40 mg total) by mouth daily. 90 capsule 3  . PIOGLITAZONE HCL 30 MG PO TABS Oral Take 1 tablet (30 mg total) by mouth daily. 90 tablet 3  . ROSUVASTATIN CALCIUM 10 MG PO TABS Oral Take 10 mg by mouth daily.      [name]  AMLODIPINE BESYLATE 10 MG PO TABS Oral Take 1 tablet (10 mg total) by mouth daily. 90 tablet 3  . FERROUS SULFATE 325 (65 FE) MG PO TABS Oral Take 325 mg by mouth daily with breakfast.    . MOMETASONE FURO-FORMOTEROL FUM 200-5 MCG/ACT IN AERO Inhalation Inhale 2 Act into the lungs 2 (two) times daily. 3 Inhaler 0  . VALSARTAN 320 MG PO TABS Oral Take 1 tablet (320 mg total) by mouth daily. 90 tablet 3    BP 156/81  Pulse 84  Temp 97.2 F (36.2 C) (Oral)  Resp 24  SpO2 100%  Physical Exam  Nursing note and vitals reviewed. Constitutional: She is oriented to person, place, and time. She appears well-developed and well-nourished.       Pt is tearful. obese  Eyes: Conjunctivae normal are normal.  Neck: Normal range of motion. Neck supple.  Cardiovascular: Normal rate, regular rhythm and normal heart sounds.   Pulmonary/Chest: Effort normal and breath sounds normal. No respiratory distress. She has no wheezes. She has no rales.    Abdominal: Soft. Bowel sounds are normal. She exhibits no distension. There is no tenderness. There is no rebound.  Musculoskeletal:       Lumbar midline and left paravertebral tenderness. Pain with left straight leg raise. No pain with left hip internal or external rotation  Neurological: She is alert and oriented to person, place, and time.       5/5 and equal  lower extremity strength bilaterally. Patellar reflexes 2+ bilaterally   Skin: Skin is warm and dry.  Psychiatric: She has a normal mood and affect.    ED Course  Procedures (including critical care time)  Lower back pain after pulling and pushing heavy objects. Hx of the same. No red flag suggesting cauda equina. No abdominal pain. Afebrile. Will get lumbar film and UA.  Pain meds ordered.   Spoke with radiology tech, pt apparently has had two lumbar films in the last month showing degenerative changes. Pt has no neuro deficits, do not think repeat x-ray will be beneficial. UA pending.   Results for orders placed during the hospital encounter of 09/28/12  URINALYSIS, ROUTINE W REFLEX MICROSCOPIC      Component Value Range   Color, Urine YELLOW  YELLOW   APPearance CLEAR  CLEAR   Specific Gravity, Urine 1.009  1.005 - 1.030   pH 7.0  5.0 - 8.0   Glucose, UA NEGATIVE  NEGATIVE mg/dL   Hgb urine dipstick NEGATIVE  NEGATIVE   Bilirubin Urine NEGATIVE  NEGATIVE   Ketones, ur NEGATIVE  NEGATIVE mg/dL   Protein, ur NEGATIVE  NEGATIVE mg/dL   Urobilinogen, UA 0.2  0.0 - 1.0 mg/dL   Nitrite NEGATIVE  NEGATIVE   Leukocytes, UA NEGATIVE  NEGATIVE   Dg Lumbar Spine Complete  09/12/2012  *RADIOLOGY REPORT*  Clinical Data: Pain post fall, left leg pain.  LUMBAR SPINE - COMPLETE 4+ VIEW  Comparison: 08/03/2012  Findings: Negative for fracture.  Persistent narrowing of the L4-5 interspace.  L5 is a transitional segment.  Facet degenerative changes bilaterally L3-4 and L4-5.  Normal alignment.  Patchy aortic calcifications.  Anterior  endplate spurring [id number].  IMPRESSION:  1.  Negative for fracture or other acute abnormality. 2.  Multilevel   degenerative changes as above.   Original Report Authenticated By: Trecia Rogers, M.D.    2:49 PM Pt feeling 'slightly better.' Pt refusing pain medications. States she has to drive home. Notes from chart  state Dr. Ronnald Ramp, who pt saw for the same complaint 2 wks ago, is making referral to a pain specialist. Will do pain meds at home, short prednisone taper, follow up.    1. Lumbar radiculopathy       MDM          Renold Genta, PA 09/28/12 1658

## 2012-09-28 NOTE — ED Provider Notes (Signed)
Medical screening examination/treatment/procedure(s) were performed by non-physician practitioner and as supervising physician I was immediately available for consultation/collaboration.  Derwood Kaplan, MD 09/28/12 [phone]

## 2012-09-28 NOTE — Discharge Instructions (Signed)
Take percocet as prescribed for pain as needed. Take prednisone as prescribe until all gone. Follow up with your primary care doctor, Dr. Ronnald Ramp, for recheck and pain management referral.   Lumbosacral Radiculopathy Lumbosacral radiculopathy is a pinched nerve or nerves in the low back (lumbosacral area). When this happens you may have weakness in your legs and may not be able to stand on your toes. You may have pain going down into your legs. There may be difficulties with walking normally. There are many causes of this problem. Sometimes this may happen from an injury, or simply from arthritis or boney problems. It may also be caused by other illnesses such as diabetes. If there is no improvement after treatment, further studies may be done to find the exact cause. DIAGNOSIS  X-rays may be needed if the problems become long standing. Electromyograms may be done. This study is one in which the working of nerves and muscles is studied. HOME CARE INSTRUCTIONS   Applications of ice packs may be helpful. Ice can be used in a plastic bag with a towel around it to prevent frostbite to skin. This may be used every 2 hours for 20 to 30 minutes, or as needed, while awake, or as directed by your caregiver.  Only take over-the-counter or prescription medicines for pain, discomfort, or fever as directed by your caregiver.  If physical therapy was prescribed, follow your caregiver's directions. SEEK IMMEDIATE MEDICAL CARE IF:   You have pain not controlled with medications.  You seem to be getting worse rather than better.  You develop increasing weakness in your legs.  You develop loss of bowel or bladder control.  You have difficulty with walking or balance, or develop clumsiness in the use of your legs.  You have a fever. MAKE SURE YOU:   Understand these instructions.  Will watch your condition.  Will get help right away if you are not doing well or get worse. Document Released: 11/30/2005  Document Revised: 02/22/2012 Document Reviewed: 07/20/2008 [facility] Patient Information 2013 [place].

## 2012-09-28 NOTE — ED Notes (Signed)
Last rescue inhaler use Tuesday. No current symptoms, SOB, chest pain

## 2012-09-28 NOTE — ED Notes (Signed)
Per pt is having severe left sided back pain radiating down left leg. sts she has had a few falls lately. sts hx of arthritis. Pt crying in pain at triage

## 2012-09-30 ENCOUNTER — Inpatient Hospital Stay: Admission: RE | Admit: 2012-09-30 | Payer: Medicare Other | Source: Ambulatory Visit

## 2012-10-04 ENCOUNTER — Telehealth: Payer: Self-pay | Admitting: Internal Medicine

## 2012-10-04 NOTE — Telephone Encounter (Signed)
Caller: Hailly/Patient; Patient Name: Valerie [name]; PCP: Sanda Linger (Adults only); Best Callback Phone Number: [phone]. Patient was referred to the pain clinic and had her first appointment this morning. She was told that pain medications are not given at the first appointment. The patient however, did test positive for marijuana on her drug screen. She reports that she has never done drugs nor has anyone else in her house, so she is unsure of how this happened. Patient reports her pain is very bad at this time. She currently has an appointment with Dr. Yetta Barre on 10/06/12. Patient was told to make this appointment by the pain clinic. Patient has not started taking some of the medications that were prescribed at the last office visit. She seems to be concerned that she may be having problems with her memory. Pain to the left back and down the left leg at times. Movement is very painful at times and patient reports weakness also. Reports more frequent urination than normal. Triaged per Back Symptoms guideline, disposition: See provider within 24 hours for "Following significant trauma and has been mobile since injury but is now having back or neck pain." Appointment scheduled at 3:45pm on 10/05/12 with Dr. Yetta Barre. Care advice and call back parameters given per guideline. Patient verbalized understanding.

## 2012-10-05 ENCOUNTER — Ambulatory Visit (INDEPENDENT_AMBULATORY_CARE_PROVIDER_SITE_OTHER): Payer: Medicare Other | Admitting: Internal Medicine

## 2012-10-05 ENCOUNTER — Encounter: Payer: Self-pay | Admitting: Internal Medicine

## 2012-10-05 VITALS — BP 136/76 | HR 78 | Temp 97.5°F | Resp 16 | Ht 66.0 in | Wt 279.5 lb

## 2012-10-05 DIAGNOSIS — M48061 Spinal stenosis, lumbar region without neurogenic claudication: Secondary | ICD-10-CM

## 2012-10-05 DIAGNOSIS — M549 Dorsalgia, unspecified: Secondary | ICD-10-CM

## 2012-10-05 MED ORDER — OXYCODONE-ACETAMINOPHEN 5-325 MG PO TABS: 1.0000 | ORAL_TABLET | ORAL | Status: DC | PRN

## 2012-10-05 NOTE — Assessment & Plan Note (Signed)
She has worsening s/s, prior MRI done one year ago shows spinal stenosis, she will continue current meds for pain, I think she needs to see a neurosurgeon to be evaluated for surgical options

## 2012-10-05 NOTE — Progress Notes (Signed)
Subjective:    Patient ID: Valerie [name], female    DOB: [birth year], 66 y.o.   MRN: [id number]  Back Pain This is a recurrent problem. The current episode started more than 1 year ago. The problem occurs constantly. The problem has been gradually worsening since onset. The pain is present in the lumbar spine. The quality of the pain is described as shooting and stabbing. The pain radiates to the left thigh. The pain is at a severity of 7/10. The pain is moderate. The pain is worse during the day. The symptoms are aggravated by bending and standing. Associated symptoms include tingling (left leg) and weakness (left leg). Pertinent negatives include no abdominal pain, bladder incontinence, bowel incontinence, chest pain, dysuria, fever, headaches, leg pain, numbness, paresis, paresthesias, pelvic pain, perianal numbness or weight loss. Risk factors include obesity, menopause and lack of exercise. She has tried NSAIDs and analgesics (aleve, percocet, prednisone) for the symptoms. The treatment provided mild relief.      Review of Systems  Constitutional: Negative for fever, chills, weight loss, diaphoresis, activity change, appetite change, fatigue and unexpected weight change.  HENT: Negative.   Eyes: Negative.   Respiratory: Negative for cough, chest tightness, shortness of breath, wheezing and stridor.   Cardiovascular: Negative for chest pain, palpitations and leg swelling.  Gastrointestinal: Negative.  Negative for abdominal pain and bowel incontinence.  Genitourinary: Negative.  Negative for bladder incontinence, dysuria and pelvic pain.  Musculoskeletal: Positive for back pain. Negative for joint swelling, arthralgias and gait problem.  Neurological: Positive for tingling (left leg) and weakness (left leg). Negative for numbness, headaches and paresthesias.  Hematological: Negative.   Psychiatric/Behavioral: Negative.        Objective:   Physical Exam  Vitals  reviewed. Constitutional: She is oriented to person, place, and time. Vital signs are normal. She appears well-developed and well-nourished.  Non-toxic appearance. She does not have a sickly appearance. She does not appear ill. No distress.  HENT:  Head: Normocephalic and atraumatic.  Mouth/Throat: Oropharynx is clear and moist. No oropharyngeal exudate.  Eyes: Conjunctivae normal are normal. Right eye exhibits no discharge. Left eye exhibits no discharge. No scleral icterus.  Neck: Normal range of motion. Neck supple. No JVD present. No tracheal deviation present. No thyromegaly present.  Cardiovascular: Normal rate, regular rhythm, normal heart sounds and intact distal pulses.  Exam reveals no gallop and no friction rub.   No murmur heard. Pulmonary/Chest: Effort normal and breath sounds normal. No stridor. No respiratory distress. She has no wheezes. She has no rales. She exhibits no tenderness.  Abdominal: Soft. Bowel sounds are normal. She exhibits no distension and no mass. There is no tenderness. There is no rebound and no guarding.  Musculoskeletal: Normal range of motion. She exhibits no tenderness.  Lymphadenopathy:    She has no cervical adenopathy.  Neurological: She is alert and oriented to person, place, and time. She has normal strength. She displays no atrophy, no tremor and normal reflexes. No cranial nerve deficit or sensory deficit. She exhibits normal muscle tone. She displays a negative Romberg sign. She displays no seizure activity. Coordination and gait normal. She displays no Babinski's sign on the right side. She displays no Babinski's sign on the left side.  Reflex Scores:      Tricep reflexes are 1+ on the right side and 1+ on the left side.      Bicep reflexes are 1+ on the right side and 1+ on the left side.  Brachioradialis reflexes are 1+ on the right side and 1+ on the left side.      Patellar reflexes are 1+ on the right side and 1+ on the left side.       Achilles reflexes are 1+ on the right side and 1+ on the left side.      - SLR in BLE  Skin: Skin is warm and dry. No rash noted. She is not diaphoretic. No erythema. No pallor.  Psychiatric: She has a normal mood and affect. Her behavior is normal. Judgment and thought content normal.     Lab Results  Component Value Date   WBC 12.8* 08/19/2012   HGB 11.1* 08/19/2012   HCT 34.2* 08/19/2012   PLT 321.0 08/19/2012   GLUCOSE 105* 08/03/2012   CHOL 200 03/21/2012   TRIG 286.0* 03/21/2012   HDL 51.70 03/21/2012   LDLDIRECT 104.3 03/21/2012   ALT 21 08/03/2012   AST 24 08/03/2012   NA 143 08/03/2012   K 3.8 08/03/2012   CL 107 08/03/2012   CREATININE 0.70 08/03/2012   BUN 7 08/03/2012   CO2 25 08/03/2012   TSH 2.11 08/19/2012   INR 0.97 07/12/2010   HGBA1C 6.7* 08/19/2012   MICROALBUR 3.2* 10/17/2010   Dg Lumbar Spine Complete  09/12/2012  *RADIOLOGY REPORT*  Clinical Data: Pain post fall, left leg pain.  LUMBAR SPINE - COMPLETE 4+ VIEW  Comparison: 08/03/2012  Findings: Negative for fracture.  Persistent narrowing of the L4-5 interspace.  L5 is a transitional segment.  Facet degenerative changes bilaterally L3-4 and L4-5.  Normal alignment.  Patchy aortic calcifications.  Anterior endplate spurring [id number].  IMPRESSION:  1.  Negative for fracture or other acute abnormality. 2.  Multilevel   degenerative changes as above.   Original Report Authenticated By: Osa Craver, M.D.     Assessment & Plan:

## 2012-10-05 NOTE — Patient Instructions (Signed)
Spinal Stenosis One cause of back pain is spinal stenosis. Stenosis means abnormal narrowing. The spinal canal contains and protects the spinal nerve roots. In spinal stenosis, the spinal canal narrows and pinches the spinal cord and nerves. This causes low back pain and pain in the legs. Stenosis may pinch the nerves that control muscles and sensation in the legs. This leads to pain and abnormal feelings in the leg muscles and areas supplied by those nerves. CAUSES  Spinal stenosis often happens to people as they get older and arthritic boney growths occur in their spinal canal. There is also a loss of the disk height between the bones of the back, which also adds to this problem. Sometimes the problem is present at birth. SYMPTOMS   Pain that is generally worse with activities, particularly standing and walking.  Numbness, tingling, hot or cold feelings, weakness, or a weariness in the legs.  Clumsiness, frequent falling, and a foot-slapping gait, which may come as a result of nerve pressure and muscle weakness. DIAGNOSIS   Your caregiver may suspect spinal stenosis if you have unusual leg symptoms, such as those previously mentioned.  Your orthopedic surgeon may request special imaging exams, such a computerized magnetic scan (MRI) or computerized X-ray scan (CT) to find out the cause of the problem. TREATMENT   Sometimes treatments such as postural changes or nonsteroidal anti-inflammatory drugs will relieve the pain.  Nonsteroidal anti-inflammatory medications may help relieve symptoms. These medicines do this by decreasing swelling and inflammation in the nerves.  When stenosis causes severe nerve root compression, conservative treatment may not be enough to maintain a normal lifestyle. Surgery may be recommended to relieve the pressure on affected nerves. In properly selected patients, the results are very good, and patients are able to continue a normal lifestyle. HOME CARE  INSTRUCTIONS   Flexing the spine by leaning forward while walking may relieve symptoms. Lying with the knees drawn up to the chest may offer some relief. These positions enlarge the space available to the nerves. They may make it easier for stenosis sufferers to walk longer distances.  Rest, followed by gradually resuming activity, also can help.  Aerobic activity, such as bicycling or swimming, is often recommended.  Losing weight can also relieve some of the load on the spine.  Application of warm or cold compresses to the area of pain can be helpful. SEEK MEDICAL CARE IF:   The periods of relief between episodes of pain become shorter and shorter.  You experience pain that radiates down your leg, even when you are not standing or walking. SEEK IMMEDIATE MEDICAL CARE IF:   You have a loss of bowel or bladder control.  You have a sudden loss of feeling in your legs.  You suddenly cannot move your legs. Document Released: 02/20/2004 Document Revised: 02/22/2012 Document Reviewed: 04/17/2010 [facility] Patient Information 2013 [place].

## 2012-10-05 NOTE — Assessment & Plan Note (Signed)
Continue percocet and aleve as needed 

## 2012-10-06 ENCOUNTER — Ambulatory Visit: Payer: Medicare Other | Admitting: Internal Medicine

## 2012-10-07 ENCOUNTER — Ambulatory Visit: Payer: Medicare Other | Admitting: Internal Medicine

## 2012-10-10 ENCOUNTER — Encounter: Payer: Medicare Other | Admitting: Internal Medicine

## 2012-10-10 DIAGNOSIS — Z0289 Encounter for other administrative examinations: Secondary | ICD-10-CM

## 2012-10-22 ENCOUNTER — Ambulatory Visit (INDEPENDENT_AMBULATORY_CARE_PROVIDER_SITE_OTHER): Payer: Medicare Other | Admitting: Family Medicine

## 2012-10-22 ENCOUNTER — Encounter: Payer: Self-pay | Admitting: Family Medicine

## 2012-10-22 VITALS — BP 144/68 | HR 92 | Temp 96.9°F | Wt 280.0 lb

## 2012-10-22 DIAGNOSIS — L600 Ingrowing nail: Secondary | ICD-10-CM

## 2012-10-22 DIAGNOSIS — B86 Scabies: Secondary | ICD-10-CM | POA: Insufficient documentation

## 2012-10-22 MED ORDER — SULFAMETHOXAZOLE-TMP DS 800-160 MG PO TABS: 1.0000 | ORAL_TABLET | Freq: Two times a day (BID) | ORAL | Status: DC

## 2012-10-22 MED ORDER — PERMETHRIN 5 % EX CREA: TOPICAL_CREAM | Freq: Once | CUTANEOUS | Status: DC

## 2012-10-22 MED ORDER — HYDROXYZINE HCL 25 MG PO TABS: 25.0000 mg | ORAL_TABLET | Freq: Three times a day (TID) | ORAL | Status: DC | PRN

## 2012-10-22 NOTE — Patient Instructions (Addendum)
This is scabies Apply the elimite cream from the chin down, sleep in it, wash it off in the morning and then wash all sheets, towels, clothes, etc You may need to repeat the treatment in 7 days Start the Bactrim for the ingrown toenail Call the podiatrist to treat your ingrown nail Use the Atarax as needed for itching- this may make you sleepy Call with any questions or concerns Hang in there!!

## 2012-10-22 NOTE — Progress Notes (Signed)
  Subjective:    Patient ID: Valerie [name], female    DOB: [birth year], 66 y.o.   MRN: [id number]  HPI Itchy rash- sxs started 2-3 days ago.  Scattered excoriated lesions on arms, legs, thighs, waist band, buttock.  Granddaughter w/ similar.  Pt has dog, neighbor's dog is frequently around.  No relief w/ hydrocortisone cream.  L great toe nail ingrown- painful, oozing.  Has podiatrist.  Wants to review A1C, lipid panel, TSH.   Review of Systems For ROS see HPI     Objective:   Physical Exam  Vitals reviewed. Constitutional: She appears well-developed and well-nourished. No distress.  Skin: Skin is warm and dry. Rash (pt w/ scattered lesions consistent w/ mite burrows on hands, wrists, arms, waist, buttock, legs) noted.       L great toenail w/ induration, erythema and pus when pressed along medial nail margin          Assessment & Plan:

## 2012-10-23 ENCOUNTER — Encounter: Payer: Self-pay | Admitting: Family Medicine

## 2012-10-23 NOTE — Assessment & Plan Note (Signed)
New.  Pt's rash and sxs consistent w/ scabies.  Start elimite.  Reviewed supportive care and red flags that should prompt return.  Pt expressed understanding and is in agreement w/ plan.

## 2012-10-23 NOTE — Assessment & Plan Note (Signed)
New.  Start abx.  Pt to call her podiatrist.  Pt expressed understanding and is in agreement w/ plan.

## 2012-11-02 ENCOUNTER — Encounter: Payer: Self-pay | Admitting: Internal Medicine

## 2012-11-02 ENCOUNTER — Ambulatory Visit (INDEPENDENT_AMBULATORY_CARE_PROVIDER_SITE_OTHER): Payer: Medicare Other | Admitting: Internal Medicine

## 2012-11-02 VITALS — BP 124/76 | HR 87 | Temp 97.5°F | Resp 16 | Wt 282.0 lb

## 2012-11-02 DIAGNOSIS — M549 Dorsalgia, unspecified: Secondary | ICD-10-CM

## 2012-11-02 DIAGNOSIS — E669 Obesity, unspecified: Secondary | ICD-10-CM

## 2012-11-02 DIAGNOSIS — F418 Other specified anxiety disorders: Secondary | ICD-10-CM

## 2012-11-02 DIAGNOSIS — I10 Essential (primary) hypertension: Secondary | ICD-10-CM

## 2012-11-02 DIAGNOSIS — F341 Dysthymic disorder: Secondary | ICD-10-CM

## 2012-11-02 MED ORDER — ALPRAZOLAM 2 MG PO TABS: 2.0000 mg | ORAL_TABLET | Freq: Three times a day (TID) | ORAL | Status: DC | PRN

## 2012-11-02 MED ORDER — PHENTERMINE HCL 15 MG PO CAPS: 15.0000 mg | ORAL_CAPSULE | ORAL | Status: DC

## 2012-11-02 NOTE — Patient Instructions (Signed)

## 2012-11-02 NOTE — Progress Notes (Signed)
Subjective:    Patient ID: Valerie [name], female    DOB: [birth year], 66 y.o.   MRN: [id number]  Rash This is a new problem. The current episode started 1 to 4 weeks ago. The problem has been gradually improving since onset. The affected locations include the left buttock. She was exposed to nothing. Pertinent negatives include no anorexia, congestion, cough, diarrhea, eye pain, facial edema, fatigue, fever, joint pain, nail changes, rhinorrhea, shortness of breath, sore throat or vomiting. Treatments tried: elimite, topical steroid. The treatment provided significant relief. Her past medical history is significant for eczema.      Review of Systems  Constitutional: Positive for unexpected weight change (weight gain). Negative for fever, chills, diaphoresis, activity change, appetite change and fatigue.  HENT: Negative.  Negative for congestion, sore throat and rhinorrhea.   Eyes: Negative.  Negative for pain.  Respiratory: Negative.  Negative for cough and shortness of breath.   Cardiovascular: Negative.  Negative for chest pain, palpitations and leg swelling.  Gastrointestinal: Negative for nausea, vomiting, abdominal pain, diarrhea, constipation, blood in stool and anorexia.  Genitourinary: Negative.   Musculoskeletal: Positive for back pain (improving). Negative for myalgias, joint pain, joint swelling, arthralgias and gait problem.  Skin: Positive for rash. Negative for nail changes, color change, pallor and wound.  Neurological: Negative for dizziness, syncope, speech difficulty, weakness, numbness and headaches.  Hematological: Negative for adenopathy. Does not bruise/bleed easily.  Psychiatric/Behavioral: Positive for dysphoric mood and decreased concentration. Negative for suicidal ideas, hallucinations, confusion, sleep disturbance and self-injury. The patient is nervous/anxious. The patient is not hyperactive.        Objective:   Physical Exam  Constitutional: She is oriented  to person, place, and time. She appears well-developed and well-nourished. No distress.  HENT:  Head: Normocephalic and atraumatic.  Mouth/Throat: Oropharynx is clear and moist. No oropharyngeal exudate.  Eyes: Conjunctivae normal are normal. Right eye exhibits no discharge. Left eye exhibits no discharge. No scleral icterus.  Neck: Normal range of motion. Neck supple. No JVD present. No tracheal deviation present. No thyromegaly present.  Cardiovascular: Normal rate, regular rhythm, normal heart sounds and intact distal pulses.  Exam reveals no gallop and no friction rub.   No murmur heard. Pulmonary/Chest: Effort normal and breath sounds normal. No stridor. No respiratory distress. She has no wheezes. She has no rales. She exhibits no tenderness.  Abdominal: Soft. Bowel sounds are normal. She exhibits no distension and no mass. There is no tenderness. There is no rebound and no guarding.  Musculoskeletal: Normal range of motion. She exhibits no edema and no tenderness.  Lymphadenopathy:    She has no cervical adenopathy.  Neurological: She is oriented to person, place, and time.  Skin: Skin is warm, dry and intact. Rash noted. No abrasion, no bruising, no burn, no ecchymosis, no laceration, no lesion, no petechiae and no purpura noted. Rash is papular. Rash is not macular, not maculopapular, not nodular, not pustular, not vesicular and not urticarial. She is not diaphoretic. No cyanosis or erythema. No pallor. Nails show no clubbing.     Psychiatric: She has a normal mood and affect. Her behavior is normal. Judgment and thought content normal.     Lab Results  Component Value Date   WBC 12.8* 08/19/2012   HGB 11.1* 08/19/2012   HCT 34.2* 08/19/2012   PLT 321.0 08/19/2012   GLUCOSE 105* 08/03/2012   CHOL 200 03/21/2012   TRIG 286.0* 03/21/2012   HDL 51.70 03/21/2012   LDLDIRECT 104.3  03/21/2012   ALT 21 08/03/2012   AST 24 08/03/2012   NA 143 08/03/2012   K 3.8 08/03/2012   CL 107 08/03/2012    CREATININE 0.70 08/03/2012   BUN 7 08/03/2012   CO2 25 08/03/2012   TSH 2.11 08/19/2012   INR 0.97 07/12/2010   HGBA1C 6.7* 08/19/2012   MICROALBUR 3.2* 10/17/2010       Assessment & Plan:

## 2012-11-04 ENCOUNTER — Encounter: Payer: Self-pay | Admitting: Internal Medicine

## 2012-11-04 LAB — HM DIABETES FOOT EXAM: HM Diabetic Foot Exam: NORMAL

## 2012-11-04 NOTE — Assessment & Plan Note (Signed)
She is seeing neurosurgery about this

## 2012-11-04 NOTE — Assessment & Plan Note (Signed)
Her BP is well controlled 

## 2012-11-04 NOTE — Assessment & Plan Note (Signed)
She was not able to pay for Belviq, she will try phentermine instead

## 2012-11-04 NOTE — Assessment & Plan Note (Signed)
Xanax refill for her today

## 2013-01-20 ENCOUNTER — Other Ambulatory Visit (INDEPENDENT_AMBULATORY_CARE_PROVIDER_SITE_OTHER): Payer: Medicare Other

## 2013-01-20 ENCOUNTER — Encounter: Payer: Self-pay | Admitting: Internal Medicine

## 2013-01-20 ENCOUNTER — Ambulatory Visit (INDEPENDENT_AMBULATORY_CARE_PROVIDER_SITE_OTHER): Payer: Medicare Other | Admitting: Internal Medicine

## 2013-01-20 VITALS — BP 122/84 | HR 90 | Temp 97.8°F | Resp 16 | Wt 282.0 lb

## 2013-01-20 DIAGNOSIS — E039 Hypothyroidism, unspecified: Secondary | ICD-10-CM

## 2013-01-20 DIAGNOSIS — Z124 Encounter for screening for malignant neoplasm of cervix: Secondary | ICD-10-CM | POA: Insufficient documentation

## 2013-01-20 DIAGNOSIS — E119 Type 2 diabetes mellitus without complications: Secondary | ICD-10-CM

## 2013-01-20 DIAGNOSIS — I10 Essential (primary) hypertension: Secondary | ICD-10-CM

## 2013-01-20 DIAGNOSIS — L600 Ingrowing nail: Secondary | ICD-10-CM

## 2013-01-20 DIAGNOSIS — Z1231 Encounter for screening mammogram for malignant neoplasm of breast: Secondary | ICD-10-CM | POA: Insufficient documentation

## 2013-01-20 DIAGNOSIS — D509 Iron deficiency anemia, unspecified: Secondary | ICD-10-CM

## 2013-01-20 DIAGNOSIS — E785 Hyperlipidemia, unspecified: Secondary | ICD-10-CM

## 2013-01-20 DIAGNOSIS — Z23 Encounter for immunization: Secondary | ICD-10-CM

## 2013-01-20 DIAGNOSIS — J45909 Unspecified asthma, uncomplicated: Secondary | ICD-10-CM

## 2013-01-20 LAB — CBC WITH DIFFERENTIAL/PLATELET
Basophils Absolute: 0 10*3/uL (ref 0.0–0.1)
Basophils Relative: 0.4 % (ref 0.0–3.0)
Eosinophils Absolute: 0.3 10*3/uL (ref 0.0–0.7)
Eosinophils Relative: 2.7 % (ref 0.0–5.0)
HCT: 35.1 % — ABNORMAL LOW (ref 36.0–46.0)
Hemoglobin: 11.7 g/dL — ABNORMAL LOW (ref 12.0–15.0)
Lymphocytes Relative: 30 % (ref 12.0–46.0)
Lymphs Abs: 3.1 10*3/uL (ref 0.7–4.0)
MCHC: 33.5 g/dL (ref 30.0–36.0)
MCV: 85.6 fl (ref 78.0–100.0)
Monocytes Absolute: 0.9 10*3/uL (ref 0.1–1.0)
Monocytes Relative: 8.7 % (ref 3.0–12.0)
Neutro Abs: 6 10*3/uL (ref 1.4–7.7)
Neutrophils Relative %: 58.2 % (ref 43.0–77.0)
Platelets: 362 10*3/uL (ref 150.0–400.0)
RBC: 4.09 Mil/uL (ref 3.87–5.11)
RDW: 15.7 % — ABNORMAL HIGH (ref 11.5–14.6)
WBC: 10.3 10*3/uL (ref 4.5–10.5)

## 2013-01-20 LAB — COMPREHENSIVE METABOLIC PANEL
ALT: 33 U/L (ref 0–35)
AST: 32 U/L (ref 0–37)
Albumin: 4.3 g/dL (ref 3.5–5.2)
Alkaline Phosphatase: 92 U/L (ref 39–117)
BUN: 8 mg/dL (ref 6–23)
CO2: 29 mEq/L (ref 19–32)
Calcium: 9.3 mg/dL (ref 8.4–10.5)
Chloride: 103 mEq/L (ref 96–112)
Creatinine, Ser: 0.7 mg/dL (ref 0.4–1.2)
GFR: 104.59 mL/min (ref >60.00)
Glucose, Bld: 112 mg/dL — ABNORMAL HIGH (ref 70–99)
Potassium: 3.8 mEq/L (ref 3.5–5.1)
Sodium: 140 mEq/L (ref 135–145)
Total Bilirubin: 0.5 mg/dL (ref 0.3–1.2)
Total Protein: 7.8 g/dL (ref 6.0–8.3)

## 2013-01-20 LAB — URINALYSIS, ROUTINE W REFLEX MICROSCOPIC
Bilirubin Urine: NEGATIVE
Hgb urine dipstick: NEGATIVE
Ketones, ur: NEGATIVE
Leukocytes, UA: NEGATIVE
Nitrite: NEGATIVE
Specific Gravity, Urine: 1.01 (ref 1.000–1.030)
Total Protein, Urine: NEGATIVE
Urine Glucose: NEGATIVE
Urobilinogen, UA: 0.2 (ref 0.0–1.0)
pH: 7 (ref 5.0–8.0)

## 2013-01-20 LAB — LIPID PANEL
Cholesterol: 142 mg/dL (ref 0–200)
HDL: 43.4 mg/dL (ref >39.00)
Total CHOL/HDL Ratio: 3
Triglycerides: 211 mg/dL — ABNORMAL HIGH (ref 0.0–149.0)
VLDL: 42.2 mg/dL — ABNORMAL HIGH (ref 0.0–40.0)

## 2013-01-20 LAB — TSH: TSH: 2.25 u[IU]/mL (ref 0.35–5.50)

## 2013-01-20 LAB — HEMOGLOBIN A1C: Hgb A1c MFr Bld: 6.9 % — ABNORMAL HIGH (ref 4.6–6.5)

## 2013-01-20 LAB — HM DIABETES FOOT EXAM: HM Diabetic Foot Exam: NORMAL

## 2013-01-20 LAB — LDL CHOLESTEROL, DIRECT: Direct LDL: 61.3 mg/dL

## 2013-01-20 MED ORDER — LINAGLIPTIN 5 MG PO TABS: 5.0000 mg | ORAL_TABLET | Freq: Every day | ORAL | Status: DC

## 2013-01-20 MED ORDER — OLMESARTAN MEDOXOMIL 20 MG PO TABS: 20.0000 mg | ORAL_TABLET | Freq: Every day | ORAL | Status: DC

## 2013-01-20 NOTE — Progress Notes (Signed)
Subjective:    Patient ID: Valerie [name], female    DOB: September 27, 1946, 67 y.o.   MRN: [id number]  Diabetes She presents for her follow-up diabetic visit. She has type 2 diabetes mellitus. Her disease course has been stable. There are no hypoglycemic associated symptoms. Pertinent negatives for hypoglycemia include no dizziness or tremors. Associated symptoms include fatigue. Pertinent negatives for diabetes include no blurred vision, no chest pain, no foot paresthesias, no foot ulcerations, no polydipsia, no polyphagia, no polyuria, no visual change, no weakness and no weight loss. There are no hypoglycemic complications. Symptoms are stable. There are no diabetic complications. Current diabetic treatment includes oral agent (monotherapy). She is compliant with treatment most of the time. Her weight is stable. She is following a generally unhealthy diet. When asked about meal planning, she reported none. She never participates in exercise. Her home blood glucose trend is increasing steadily. Her breakfast blood glucose range is generally 140-180 mg/dl. Her lunch blood glucose range is generally 180-200 mg/dl. Her dinner blood glucose range is generally 180-200 mg/dl. Her highest blood glucose is >200 mg/dl. Her overall blood glucose range is 180-200 mg/dl. An ACE inhibitor/angiotensin II receptor blocker is being taken. She sees a podiatrist.Eye exam is current.      Review of Systems  Constitutional: Positive for fatigue. Negative for fever, chills, weight loss, diaphoresis, activity change, appetite change and unexpected weight change.  HENT: Negative.   Eyes: Negative.  Negative for blurred vision.  Respiratory: Positive for apnea, shortness of breath and wheezing. Negative for cough, choking, chest tightness and stridor.   Cardiovascular: Positive for leg swelling. Negative for chest pain and palpitations.  Gastrointestinal: Negative for nausea, vomiting, abdominal pain, diarrhea and  constipation.  Endocrine: Negative for cold intolerance, heat intolerance, polydipsia, polyphagia and polyuria.  Genitourinary: Negative.   Musculoskeletal: Negative.  Negative for myalgias and back pain.  Skin: Negative.   Neurological: Negative for dizziness, tremors, weakness, light-headedness and numbness.  Hematological: Negative for adenopathy. Does not bruise/bleed easily.  Psychiatric/Behavioral: Negative.        Objective:   Physical Exam  Vitals reviewed. Constitutional: She is oriented to person, place, and time. She appears well-developed and well-nourished. No distress.  HENT:  Head: Normocephalic and atraumatic.  Mouth/Throat: Oropharynx is clear and moist. No oropharyngeal exudate.  Eyes: Conjunctivae are normal. Right eye exhibits no discharge. Left eye exhibits no discharge. No scleral icterus.  Neck: Normal range of motion. Neck supple. No JVD present. No tracheal deviation present. No thyromegaly present.  Cardiovascular: Normal rate, regular rhythm, normal heart sounds and intact distal pulses.  Exam reveals no gallop and no friction rub.   No murmur heard. Pulmonary/Chest: Effort normal and breath sounds normal. No stridor. No respiratory distress. She has no wheezes. She has no rales. She exhibits no tenderness.  Abdominal: Soft. Bowel sounds are normal. She exhibits no distension and no mass. There is no tenderness. There is no rebound and no guarding.  Musculoskeletal: Normal range of motion. She exhibits no edema and no tenderness.  Lymphadenopathy:    She has no cervical adenopathy.  Neurological: She is oriented to person, place, and time.  Skin: Skin is warm and dry. No rash noted. She is not diaphoretic. No erythema. No pallor.  Right great toenail is ingrown but there is no ttp, erythema, warmth, exudate  Psychiatric: She has a normal mood and affect. Her behavior is normal. Judgment and thought content normal.      Lab Results  Component Value  Date    WBC 12.8* 08/19/2012   HGB 11.1* 08/19/2012   HCT 34.2* 08/19/2012   PLT 321.0 08/19/2012   GLUCOSE 105* 08/03/2012   CHOL 200 03/21/2012   TRIG 286.0* 03/21/2012   HDL 51.70 03/21/2012   LDLDIRECT 104.3 03/21/2012   ALT 21 08/03/2012   AST 24 08/03/2012   NA 143 08/03/2012   K 3.8 08/03/2012   CL 107 08/03/2012   CREATININE 0.70 08/03/2012   BUN 7 08/03/2012   CO2 25 08/03/2012   TSH 2.11 08/19/2012   INR 0.97 07/12/2010   HGBA1C 6.7* 08/19/2012   MICROALBUR 3.2* 10/17/2010      Assessment & Plan:

## 2013-01-20 NOTE — Patient Instructions (Signed)

## 2013-01-22 ENCOUNTER — Encounter: Payer: Self-pay | Admitting: Internal Medicine

## 2013-01-22 NOTE — Assessment & Plan Note (Signed)
She will stop amlodipine due to her reports of edema I gave her samples of benicar since she tells me that her insurance does not cover ARB's Today I will check her lytes and renal function

## 2013-01-22 NOTE — Assessment & Plan Note (Signed)
She is doing well on crestor 

## 2013-01-22 NOTE — Assessment & Plan Note (Signed)
She will stop actos due to her reports of edema (no edema noted today) I have asked her to start tradjenta I will check her a1c and her renal function today

## 2013-01-22 NOTE — Assessment & Plan Note (Signed)
I will check her TSh today and will adjust her dose if needed 

## 2013-01-22 NOTE — Assessment & Plan Note (Signed)
Podiatry referral

## 2013-01-22 NOTE — Assessment & Plan Note (Signed)
She wants to get PFT's done -----> pulm referral

## 2013-01-22 NOTE — Assessment & Plan Note (Signed)
I will recheck her CBC today 

## 2013-01-23 ENCOUNTER — Encounter: Payer: Self-pay | Admitting: Obstetrics & Gynecology

## 2013-02-04 ENCOUNTER — Encounter: Payer: Self-pay | Admitting: Neurology

## 2013-02-08 ENCOUNTER — Encounter: Payer: Medicare Other | Admitting: Obstetrics & Gynecology

## 2013-02-20 ENCOUNTER — Ambulatory Visit: Payer: Medicare Other | Admitting: Pulmonary Disease

## 2013-02-22 ENCOUNTER — Encounter: Payer: Medicare Other | Admitting: Obstetrics & Gynecology

## 2013-02-23 ENCOUNTER — Ambulatory Visit (INDEPENDENT_AMBULATORY_CARE_PROVIDER_SITE_OTHER): Payer: Medicare Other | Admitting: Internal Medicine

## 2013-02-23 ENCOUNTER — Encounter: Payer: Self-pay | Admitting: Internal Medicine

## 2013-02-23 ENCOUNTER — Institutional Professional Consult (permissible substitution): Payer: Medicare Other | Admitting: Pulmonary Disease

## 2013-02-23 VITALS — BP 160/92 | HR 80 | Temp 98.1°F | Resp 16 | Wt 271.5 lb

## 2013-02-23 DIAGNOSIS — I10 Essential (primary) hypertension: Secondary | ICD-10-CM

## 2013-02-23 DIAGNOSIS — E1129 Type 2 diabetes mellitus with other diabetic kidney complication: Secondary | ICD-10-CM

## 2013-02-23 LAB — GLUCOSE, POCT (MANUAL RESULT ENTRY): POC Glucose: 193 mg/dl — AB (ref 70–99)

## 2013-02-23 MED ORDER — OLMESARTAN MEDOXOMIL-HCTZ 20-12.5 MG PO TABS: 1.0000 | ORAL_TABLET | Freq: Every day | ORAL | Status: DC

## 2013-02-23 MED ORDER — DAPAGLIFLOZIN PROPANEDIOL 5 MG PO TABS: 1.0000 | ORAL_TABLET | Freq: Every day | ORAL | Status: DC

## 2013-02-23 NOTE — Patient Instructions (Signed)

## 2013-02-23 NOTE — Assessment & Plan Note (Signed)
Her BP is too high so I have change Benicar to [employer]

## 2013-02-23 NOTE — Progress Notes (Signed)
Subjective:    Patient ID: Valerie [name], female    DOB: 1946-04-03, 67 y.o.   MRN: [id number]  Hypertension This is a chronic problem. The current episode started more than 1 year ago. The problem has been gradually worsening since onset. The problem is controlled. Associated symptoms include anxiety, malaise/fatigue and peripheral edema. Pertinent negatives include no blurred vision, chest pain, headaches, neck pain, orthopnea, palpitations, PND, shortness of breath or sweats. Agents associated with hypertension include NSAIDs. Past treatments include angiotensin blockers, calcium channel blockers and beta blockers. The current treatment provides moderate improvement. Compliance problems include exercise and diet.  Identifiable causes of hypertension include sleep apnea.      Review of Systems  Constitutional: Positive for malaise/fatigue and fatigue. Negative for fever, chills, diaphoresis, activity change, appetite change and unexpected weight change.  HENT: Negative.  Negative for neck pain.   Eyes: Negative.  Negative for blurred vision.  Respiratory: Negative for apnea, cough, choking, chest tightness, shortness of breath, wheezing and stridor.   Cardiovascular: Negative for chest pain, palpitations, orthopnea, leg swelling and PND.  Gastrointestinal: Negative for nausea, vomiting, abdominal pain, diarrhea, constipation and blood in stool.  Endocrine: Positive for polydipsia, polyphagia and polyuria. Negative for cold intolerance and heat intolerance.  Musculoskeletal: Negative for myalgias, back pain, joint swelling, arthralgias and gait problem.  Skin: Negative for color change, pallor, rash and wound.  Allergic/Immunologic: Negative.   Neurological: Negative for dizziness, weakness, light-headedness and headaches.  Hematological: Negative for adenopathy. Does not bruise/bleed easily.  Psychiatric/Behavioral: Positive for dysphoric mood. Negative for suicidal ideas, hallucinations,  behavioral problems, confusion, sleep disturbance, self-injury, decreased concentration and agitation. The patient is nervous/anxious. The patient is not hyperactive.        Objective:   Physical Exam  Vitals reviewed. Constitutional: She is oriented to person, place, and time. She appears well-developed and well-nourished. No distress.  HENT:  Head: Normocephalic and atraumatic.  Mouth/Throat: Oropharynx is clear and moist. No oropharyngeal exudate.  Eyes: Conjunctivae are normal. Right eye exhibits no discharge. Left eye exhibits no discharge. No scleral icterus.  Neck: Normal range of motion. Neck supple. No JVD present. No tracheal deviation present. No thyromegaly present.  Cardiovascular: Normal rate, regular rhythm, normal heart sounds and intact distal pulses.  Exam reveals no gallop and no friction rub.   No murmur heard. Pulmonary/Chest: Breath sounds normal. No stridor. No respiratory distress. She has no wheezes. She has no rales. She exhibits no tenderness.  Abdominal: Soft. Bowel sounds are normal. She exhibits no distension and no mass. There is no tenderness. There is no rebound and no guarding.  Musculoskeletal: Normal range of motion. She exhibits edema (trace edema in BLE). She exhibits no tenderness.  Lymphadenopathy:    She has no cervical adenopathy.  Neurological: She is oriented to person, place, and time.  Skin: Skin is warm and dry. No rash noted. She is not diaphoretic. No erythema. No pallor.  Psychiatric: She has a normal mood and affect. Her behavior is normal. Judgment and thought content normal.     Lab Results  Component Value Date   WBC 10.3 01/20/2013   HGB 11.7* 01/20/2013   HCT 35.1* 01/20/2013   PLT 362.0 01/20/2013   GLUCOSE 112* 01/20/2013   CHOL 142 01/20/2013   TRIG 211.0* 01/20/2013   HDL 43.40 01/20/2013   LDLDIRECT 61.3 01/20/2013   ALT 33 01/20/2013   AST 32 01/20/2013   NA 140 01/20/2013   K 3.8 01/20/2013   CL 103  01/20/2013   CREATININE 0.7 01/20/2013    BUN 8 01/20/2013   CO2 29 01/20/2013   TSH 2.25 01/20/2013   INR 0.97 07/12/2010   HGBA1C 6.9* 01/20/2013   MICROALBUR 3.2* 10/17/2010       Assessment & Plan:

## 2013-02-23 NOTE — Assessment & Plan Note (Signed)
Her recent blood sugars have been high, around 180-200, so I have asked her to add farxiga to tradjenta to lower the blood sugar

## 2013-02-27 ENCOUNTER — Emergency Department (HOSPITAL_COMMUNITY)
Admission: EM | Admit: 2013-02-27 | Discharge: 2013-02-27 | Disposition: A | Discharge status: Home / Self Care | Payer: Medicare Other | Attending: Emergency Medicine | Admitting: Emergency Medicine

## 2013-02-27 ENCOUNTER — Emergency Department (HOSPITAL_COMMUNITY): Payer: Medicare Other

## 2013-02-27 ENCOUNTER — Encounter (HOSPITAL_COMMUNITY): Payer: Self-pay | Admitting: Emergency Medicine

## 2013-02-27 DIAGNOSIS — Z9849 Cataract extraction status, unspecified eye: Secondary | ICD-10-CM | POA: Insufficient documentation

## 2013-02-27 DIAGNOSIS — K219 Gastro-esophageal reflux disease without esophagitis: Secondary | ICD-10-CM | POA: Insufficient documentation

## 2013-02-27 DIAGNOSIS — D649 Anemia, unspecified: Secondary | ICD-10-CM | POA: Insufficient documentation

## 2013-02-27 DIAGNOSIS — J45909 Unspecified asthma, uncomplicated: Secondary | ICD-10-CM | POA: Insufficient documentation

## 2013-02-27 DIAGNOSIS — F411 Generalized anxiety disorder: Secondary | ICD-10-CM | POA: Insufficient documentation

## 2013-02-27 DIAGNOSIS — E785 Hyperlipidemia, unspecified: Secondary | ICD-10-CM | POA: Insufficient documentation

## 2013-02-27 DIAGNOSIS — R200 Anesthesia of skin: Secondary | ICD-10-CM

## 2013-02-27 DIAGNOSIS — R209 Unspecified disturbances of skin sensation: Secondary | ICD-10-CM | POA: Insufficient documentation

## 2013-02-27 DIAGNOSIS — F329 Major depressive disorder, single episode, unspecified: Secondary | ICD-10-CM | POA: Insufficient documentation

## 2013-02-27 DIAGNOSIS — M2540 Effusion, unspecified joint: Secondary | ICD-10-CM | POA: Insufficient documentation

## 2013-02-27 DIAGNOSIS — G4730 Sleep apnea, unspecified: Secondary | ICD-10-CM | POA: Insufficient documentation

## 2013-02-27 DIAGNOSIS — R109 Unspecified abdominal pain: Secondary | ICD-10-CM | POA: Insufficient documentation

## 2013-02-27 DIAGNOSIS — F3289 Other specified depressive episodes: Secondary | ICD-10-CM | POA: Insufficient documentation

## 2013-02-27 DIAGNOSIS — E119 Type 2 diabetes mellitus without complications: Secondary | ICD-10-CM | POA: Insufficient documentation

## 2013-02-27 DIAGNOSIS — R5381 Other malaise: Secondary | ICD-10-CM | POA: Insufficient documentation

## 2013-02-27 DIAGNOSIS — R002 Palpitations: Secondary | ICD-10-CM | POA: Insufficient documentation

## 2013-02-27 DIAGNOSIS — R202 Paresthesia of skin: Secondary | ICD-10-CM

## 2013-02-27 DIAGNOSIS — I10 Essential (primary) hypertension: Secondary | ICD-10-CM | POA: Insufficient documentation

## 2013-02-27 DIAGNOSIS — IMO0002 Reserved for concepts with insufficient information to code with codable children: Secondary | ICD-10-CM | POA: Insufficient documentation

## 2013-02-27 DIAGNOSIS — R42 Dizziness and giddiness: Secondary | ICD-10-CM | POA: Insufficient documentation

## 2013-02-27 DIAGNOSIS — Z8719 Personal history of other diseases of the digestive system: Secondary | ICD-10-CM | POA: Insufficient documentation

## 2013-02-27 DIAGNOSIS — Z79899 Other long term (current) drug therapy: Secondary | ICD-10-CM | POA: Insufficient documentation

## 2013-02-27 LAB — CBC WITH DIFFERENTIAL/PLATELET
Basophils Absolute: 0 10*3/uL (ref 0.0–0.1)
Basophils Relative: 0 % (ref 0–1)
Eosinophils Absolute: 0.2 10*3/uL (ref 0.0–0.7)
Eosinophils Relative: 2 % (ref 0–5)
HCT: 38.1 % (ref 36.0–46.0)
Hemoglobin: 12.6 g/dL (ref 12.0–15.0)
Lymphocytes Relative: 23 % (ref 12–46)
Lymphs Abs: 2.7 10*3/uL (ref 0.7–4.0)
MCH: 28.4 pg (ref 26.0–34.0)
MCHC: 33.1 g/dL (ref 30.0–36.0)
MCV: 86 fL (ref 78.0–100.0)
Monocytes Absolute: 1 10*3/uL (ref 0.1–1.0)
Monocytes Relative: 8 % (ref 3–12)
Neutro Abs: 8.3 10*3/uL — ABNORMAL HIGH (ref 1.7–7.7)
Neutrophils Relative %: 68 % (ref 43–77)
Platelets: 369 10*3/uL (ref 150–400)
RBC: 4.43 MIL/uL (ref 3.87–5.11)
RDW: 14.7 % (ref 11.5–15.5)
WBC: 12.2 10*3/uL — ABNORMAL HIGH (ref 4.0–10.5)

## 2013-02-27 LAB — COMPREHENSIVE METABOLIC PANEL
ALT: 28 U/L (ref 0–35)
AST: 27 U/L (ref 0–37)
Albumin: 4.4 g/dL (ref 3.5–5.2)
Alkaline Phosphatase: 99 U/L (ref 39–117)
BUN: 9 mg/dL (ref 6–23)
CO2: 27 mEq/L (ref 19–32)
Calcium: 10 mg/dL (ref 8.4–10.5)
Chloride: 101 mEq/L (ref 96–112)
Creatinine, Ser: 0.67 mg/dL (ref 0.50–1.10)
GFR calc Af Amer: >90 mL/min (ref >90)
GFR calc non Af Amer: >90 mL/min (ref >90)
Glucose, Bld: 126 mg/dL — ABNORMAL HIGH (ref 70–99)
Potassium: 4 mEq/L (ref 3.5–5.1)
Sodium: 140 mEq/L (ref 135–145)
Total Bilirubin: 0.5 mg/dL (ref 0.3–1.2)
Total Protein: 8 g/dL (ref 6.0–8.3)

## 2013-02-27 LAB — URINE MICROSCOPIC-ADD ON

## 2013-02-27 LAB — URINALYSIS, ROUTINE W REFLEX MICROSCOPIC
Bilirubin Urine: NEGATIVE
Glucose, UA: >1000 mg/dL — AB
Hgb urine dipstick: NEGATIVE
Ketones, ur: NEGATIVE mg/dL
Leukocytes, UA: NEGATIVE
Nitrite: NEGATIVE
Protein, ur: NEGATIVE mg/dL
Specific Gravity, Urine: 1.016 (ref 1.005–1.030)
Urobilinogen, UA: 0.2 mg/dL (ref 0.0–1.0)
pH: 5.5 (ref 5.0–8.0)

## 2013-02-27 LAB — GLUCOSE, CAPILLARY: Glucose-Capillary: 139 mg/dL — ABNORMAL HIGH (ref 70–99)

## 2013-02-27 LAB — POCT I-STAT TROPONIN I: Troponin i, poc: 0 ng/mL (ref 0.00–0.08)

## 2013-02-27 NOTE — ED Provider Notes (Signed)
History     CSN: [id number]  Arrival date & time 02/27/13  1318   First MD Initiated Contact with Patient 02/27/13 1359      Chief Complaint  Patient presents with  . Numbness  . Abdominal Pain    (Consider location/radiation/quality/duration/timing/severity/associated sxs/prior treatment) HPI Valerie [name] is a 67 y.o. female who presents to ED with multiple complaints. Pt states for the last week, she has had increased numbness, tingling, and weakness in left arm and leg. States hx of the same. Was told before it is probably related to her back. States last week went to her PCP, at that time had elevated blood sugar and swelling, was increased on her medications. States that swelling went down, but now having tingling and decreased sensation in left face, palpitations, and dizziness. States these symptoms started yesterday. States also having pain radiating from left flank into left lower abdomen that has been doing on for few weeks. Pt taking pain medications at home with no improvement. Denies headache or head injuries.  Past Medical History  Diagnosis Date  . Anemia   . Depression   . Diabetes mellitus, type 2   . GERD (gastroesophageal reflux disease)   . Hyperlipidemia   . Hypertension   . Sleep apnea   . Asthma   . Hemorrhoids     Past Surgical History  Procedure Laterality Date  . Benign tumors resected    . Tubal ligation    . Rotator cuff repair    . Cataract extraction Left   . Refractive surgery      Glaucoma    Family History  Problem Relation Age of Onset  . Hypertension      family history  . Alcohol abuse    . Alcohol abuse Mother   . Heart attack Mother   . Coronary artery disease Brother   . Heart attack Father   . Heart disease Sister   . Atrial fibrillation Sister   . Hypertension Sister     History  Substance Use Topics  . Smoking status: Never Smoker   . Smokeless tobacco: Never Used  . Alcohol Use: No    OB History   Grav Para  Term Preterm Abortions TAB SAB Ect Mult Living                  Review of Systems  Constitutional: Negative for fever and chills.  HENT: Negative for neck pain and neck stiffness.   Eyes: Negative for photophobia, pain and visual disturbance.  Cardiovascular: Negative.   Gastrointestinal: Positive for abdominal pain. Negative for nausea, vomiting and diarrhea.  Genitourinary: Positive for flank pain. Negative for dysuria and hematuria.  Musculoskeletal: Positive for myalgias, back pain, joint swelling and gait problem.  Skin: Negative.   Neurological: Positive for dizziness, weakness, light-headedness and numbness. Negative for syncope and headaches.  Psychiatric/Behavioral: The patient is nervous/anxious.   All other systems reviewed and are negative.    Allergies  Metformin and related; Food; and Penicillins  Home Medications   Current Outpatient Rx  Name  Route  Sig  Dispense  Refill  . albuterol (VENTOLIN HFA) 108 (90 BASE) MCG/ACT inhaler   Inhalation   Inhale 2 puffs into the lungs every 6 (six) hours as needed. For shortness of breath         . alprazolam (XANAX) 2 MG tablet   Oral   Take 1 tablet (2 mg total) by mouth 3 (three) times daily as needed. For anxiety  65 tablet   2   . amLODipine (NORVASC) 10 MG tablet               . carvedilol (COREG) 25 MG tablet   Oral   Take 25 mg by mouth 2 (two) times daily with a meal.         . Dapagliflozin Propanediol (FARXIGA) 5 MG TABS   Oral   Take 1 tablet by mouth daily.   42 tablet   0   . ferrous sulfate 325 (65 FE) MG tablet   Oral   Take 325 mg by mouth daily with breakfast.         . FLUoxetine (PROZAC) 40 MG capsule   Oral   Take 40 mg by mouth daily.         . hydrOXYzine (ATARAX/VISTARIL) 25 MG tablet   Oral   Take 1 tablet (25 mg total) by mouth 3 (three) times daily as needed for itching.   30 tablet   0   . lamoTRIgine (LAMICTAL) 200 MG tablet   Oral   Take 200 mg by mouth  daily.          [name] latanoprost (XALATAN) 0.005 % ophthalmic solution   Both Eyes   Place 1 drop into both eyes at bedtime.         [name] linagliptin (TRADJENTA) 5 MG TABS tablet   Oral   Take 1 tablet (5 mg total) by mouth daily.   84 tablet   0   . Mometasone Furo-Formoterol Fum 200-5 MCG/ACT AERO   Inhalation   Inhale 2 Act into the lungs 2 (two) times daily.   3 Inhaler   0   . montelukast (SINGULAIR) 10 MG tablet   Oral   Take 10 mg by mouth at bedtime.           [name] olmesartan-hydrochlorothiazide (BENICAR HCT) 20-12.5 MG per tablet   Oral   Take 1 tablet by mouth daily.   28 tablet   0   . omeprazole (PRILOSEC) 40 MG capsule   Oral   Take 1 capsule (40 mg total) by mouth daily.   90 capsule   3   . oxyCODONE-acetaminophen (PERCOCET) 5-325 MG per tablet   Oral   Take 1 tablet by mouth every 4 (four) hours as needed for pain.   75 tablet   0   . rosuvastatin (CRESTOR) 10 MG tablet   Oral   Take 10 mg by mouth daily.             BP 166/85  Pulse 93  Temp(Src) 97.6 F (36.4 C) (Oral)  Resp 18  SpO2 100%  Physical Exam  Nursing note and vitals reviewed. Constitutional: She is oriented to person, place, and time. She appears well-developed and well-nourished. No distress.  HENT:  Head: Normocephalic and atraumatic.  Eyes: Conjunctivae are normal.  Neck: Normal range of motion. Neck supple.  Cardiovascular: Normal rate, regular rhythm and normal heart sounds.   Pulmonary/Chest: Effort normal and breath sounds normal. No respiratory distress. She has no wheezes. She has no rales.  Abdominal: Soft. Bowel sounds are normal. She exhibits no distension. There is no tenderness. There is no rebound.  Musculoskeletal: She exhibits no edema.  Neurological: She is alert and oriented to person, place, and time. No cranial nerve deficit.  Decreased sensation over left dorsal forearm and upper lateral arm. Decreased sensation over anterior left lower leg and dorsal  foot. Decreased sensation over left  face   Skin: Skin is warm and dry.  Psychiatric:  Pt appears anxious    ED Course  Procedures (including critical care time)   Date: 02/27/2013  Rate: 88  Rhythm: normal sinus rhythm  QRS Axis: normal  Intervals: normal  ST/T Wave abnormalities: normal  Conduction Disutrbances: none  Narrative Interpretation:   Old EKG Reviewed: No significant changes noted  Results for orders placed during the hospital encounter of 02/27/13  CBC WITH DIFFERENTIAL      Result Value Range   WBC 12.2 (*) 4.0 - 10.5 K/uL   RBC 4.43  3.87 - 5.11 MIL/uL   Hemoglobin 12.6  12.0 - 15.0 g/dL   HCT 16.1  09.6 - 04.5 %   MCV 86.0  78.0 - 100.0 fL   MCH 28.4  26.0 - 34.0 pg   MCHC 33.1  30.0 - 36.0 g/dL   RDW 40.9  81.1 - 91.4 %   Platelets 369  150 - 400 K/uL   Neutrophils Relative 68  43 - 77 %   Neutro Abs 8.3 (*) 1.7 - 7.7 K/uL   Lymphocytes Relative 23  12 - 46 %   Lymphs Abs 2.7  0.7 - 4.0 K/uL   Monocytes Relative 8  3 - 12 %   Monocytes Absolute 1.0  0.1 - 1.0 K/uL   Eosinophils Relative 2  0 - 5 %   Eosinophils Absolute 0.2  0.0 - 0.7 K/uL   Basophils Relative 0  0 - 1 %   Basophils Absolute 0.0  0.0 - 0.1 K/uL  COMPREHENSIVE METABOLIC PANEL      Result Value Range   Sodium 140  135 - 145 mEq/L   Potassium 4.0  3.5 - 5.1 mEq/L   Chloride 101  96 - 112 mEq/L   CO2 27  19 - 32 mEq/L   Glucose, Bld 126 (*) 70 - 99 mg/dL   BUN 9  6 - 23 mg/dL   Creatinine, Ser 7.82  0.50 - 1.10 mg/dL   Calcium 95.6  8.4 - 21.3 mg/dL   Total Protein 8.0  6.0 - 8.3 g/dL   Albumin 4.4  3.5 - 5.2 g/dL   AST 27  0 - 37 U/L   ALT 28  0 - 35 U/L   Alkaline Phosphatase 99  39 - 117 U/L   Total Bilirubin 0.5  0.3 - 1.2 mg/dL   GFR calc non Af Amer >90  >90 mL/min   GFR calc Af Amer >90  >90 mL/min  GLUCOSE, CAPILLARY      Result Value Range   Glucose-Capillary 139 (*) 70 - 99 mg/dL   Comment 1 Documented in Chart     Comment 2 Notify RN    URINALYSIS, ROUTINE W  REFLEX MICROSCOPIC      Result Value Range   Color, Urine YELLOW  YELLOW   APPearance CLEAR  CLEAR   Specific Gravity, Urine 1.016  1.005 - 1.030   pH 5.5  5.0 - 8.0   Glucose, UA >1000 (*) NEGATIVE mg/dL   Hgb urine dipstick NEGATIVE  NEGATIVE   Bilirubin Urine NEGATIVE  NEGATIVE   Ketones, ur NEGATIVE  NEGATIVE mg/dL   Protein, ur NEGATIVE  NEGATIVE mg/dL   Urobilinogen, UA 0.2  0.0 - 1.0 mg/dL   Nitrite NEGATIVE  NEGATIVE   Leukocytes, UA NEGATIVE  NEGATIVE  URINE MICROSCOPIC-ADD ON      Result Value Range   Squamous Epithelial / LPF FEW (*) RARE  WBC, UA 0-2  <3 WBC/hpf   Bacteria, UA RARE  RARE  POCT I-STAT TROPONIN I      Result Value Range   Troponin i, poc 0.00  0.00 - 0.08 ng/mL   Comment 3            Dg Chest 2 View  02/27/2013  *RADIOLOGY REPORT*  Clinical Data: Chest pain and left-sided numbness  CHEST - 2 VIEW  Comparison: 08/03/2012  Findings: The heart and pulmonary vascularity are within normal limits.  The lungs are clear bilaterally.  No focal infiltrate or sizable effusion is seen.  No acute bony abnormality is noted.  IMPRESSION: No acute abnormality is noted.   Original Report Authenticated By: Alcide Clever, M.D.    Ct Head Wo Contrast  02/27/2013  *RADIOLOGY REPORT*  Clinical Data: Left sided numbness and tingling intermittent over past week.  Diabetic hypertensive patient with hyperlipidemia.  CT HEAD WITHOUT CONTRAST  Technique:  Contiguous axial images were obtained from the base of the skull through the vertex without contrast.  Comparison: 08/03/2012, 02/07/2011, 07/12/2010 and 10/18/2003.  Findings: No intracranial hemorrhage.  Small vessel disease type changes.  No CT evidence of large acute infarct.  The slightly dense appearance of the basilar artery has been seen on prior exams and therefore may reflect atherosclerotic type changes rather than result of acute thrombus.  No intracranial mass lesion detected on this unenhanced exam.  No hydrocephalus.   Exophthalmos.  Mastoid air cells, middle ear cavities and visualized sinuses are clear.  IMPRESSION: No intracranial hemorrhage or CT evidence of large acute infarct. Please see above.   Original Report Authenticated By: Lacy Duverney, M.D.     4:07 PM Pt continues to have decreased sensation to the left face, left arm, left leg. No weakness. Cranial nerves intact. Concerning for possible CVA. CT negative. Labs unremarkable. Will get MRI.   Pt signed out to Dr. Oletta Lamas at shift change.   1. Numbness and tingling       MDM          Lottie Mussel, PA-C 02/28/13 2108

## 2013-02-27 NOTE — ED Notes (Signed)
Pt c/o left sided facial numbness and abd pain into back starting yesterday; pt sts hx of similar in past; pt ambulatory with no obvious neuro deficits

## 2013-02-27 NOTE — Discharge Instructions (Signed)
Paresthesia Paresthesia is an abnormal burning or prickling sensation. This sensation is generally felt in the hands, arms, legs, or feet. However, it may occur in any part of the body. It is usually not painful. The feeling may be described as:  Tingling or numbness.  "Pins and needles."  Skin crawling.  Buzzing.  Limbs "falling asleep."  Itching. Most people experience temporary (transient) paresthesia at some time in their lives. CAUSES  Paresthesia may occur when you breathe too quickly (hyperventilation). It can also occur without any apparent cause. Commonly, paresthesia occurs when pressure is placed on a nerve. The feeling quickly goes away once the pressure is removed. For some people, however, paresthesia is a long-lasting (chronic) condition caused by an underlying disorder. The underlying disorder may be:  A traumatic, direct injury to nerves. Examples include a:  Broken (fractured) neck.  Fractured skull.  A disorder affecting the brain and spinal cord (central nervous system). Examples include:  Transverse myelitis.  Encephalitis.  Transient ischemic attack.  Multiple sclerosis.  Stroke.  Tumor or blood vessel problems, such as an arteriovenous malformation pressing against the brain or spinal cord.  A condition that damages the peripheral nerves (peripheral neuropathy). Peripheral nerves are not part of the brain and spinal cord. These conditions include:  Diabetes.  Peripheral vascular disease.  Nerve entrapment syndromes, such as carpal tunnel syndrome.  Shingles.  Hypothyroidism.  Vitamin B12 deficiencies.  Alcoholism.  Heavy metal poisoning (lead, arsenic).  Rheumatoid arthritis.  Systemic lupus erythematosus. DIAGNOSIS  Your caregiver will attempt to find the underlying cause of your paresthesia. Your caregiver may:  Take your medical history.  Perform a physical exam.  Order various lab tests.  Order imaging tests. TREATMENT    Treatment for paresthesia depends on the underlying cause. HOME CARE INSTRUCTIONS  Avoid drinking alcohol.  You may consider massage or acupuncture to help relieve your symptoms.  Keep all follow-up appointments as directed by your caregiver. SEEK IMMEDIATE MEDICAL CARE IF:   You feel weak.  You have trouble walking or moving.  You have problems with speech or vision.  You feel confused.  You cannot control your bladder or bowel movements.  You feel numbness after an injury.  You faint.  Your burning or prickling feeling gets worse when walking.  You have pain, cramps, or dizziness.  You develop a rash. MAKE SURE YOU:  Understand these instructions.  Will watch your condition.  Will get help right away if you are not doing well or get worse. Document Released: 11/20/2002 Document Revised: 02/22/2012 Document Reviewed: 08/21/2011 [facility] Patient Information 2013 [place].   Please see your neurologist and neurosurgeon regarding your continued problems with numbness and paresthesias.

## 2013-02-27 NOTE — ED Provider Notes (Signed)
Received in sign out.  Multiple complaints.  Primary complaint is rigth sided body numbness.  No objective weakness on exam here.  NO slurred speech, confusion.  No facial droop.  Gait intact, normal tone.  MRI of brain shows no acute stroke. Pt and family reassured. Pt has a PCP, has  Neurologist and Neurosurgeon already and recommended that she follow up with her specialists as outpt.    Results for orders placed during the hospital encounter of 02/27/13 (from the past 24 hour(s))  CBC WITH DIFFERENTIAL     Status: Abnormal   Collection Time    02/27/13  1:28 PM      Result Value Range   WBC 12.2 (*) 4.0 - 10.5 K/uL   RBC 4.43  3.87 - 5.11 MIL/uL   Hemoglobin 12.6  12.0 - 15.0 g/dL   HCT 78.2  95.6 - 21.3 %   MCV 86.0  78.0 - 100.0 fL   MCH 28.4  26.0 - 34.0 pg   MCHC 33.1  30.0 - 36.0 g/dL   RDW 08.6  57.8 - 46.9 %   Platelets 369  150 - 400 K/uL   Neutrophils Relative 68  43 - 77 %   Neutro Abs 8.3 (*) 1.7 - 7.7 K/uL   Lymphocytes Relative 23  12 - 46 %   Lymphs Abs 2.7  0.7 - 4.0 K/uL   Monocytes Relative 8  3 - 12 %   Monocytes Absolute 1.0  0.1 - 1.0 K/uL   Eosinophils Relative 2  0 - 5 %   Eosinophils Absolute 0.2  0.0 - 0.7 K/uL   Basophils Relative 0  0 - 1 %   Basophils Absolute 0.0  0.0 - 0.1 K/uL  COMPREHENSIVE METABOLIC PANEL     Status: Abnormal   Collection Time    02/27/13  1:28 PM      Result Value Range   Sodium 140  135 - 145 mEq/L   Potassium 4.0  3.5 - 5.1 mEq/L   Chloride 101  96 - 112 mEq/L   CO2 27  19 - 32 mEq/L   Glucose, Bld 126 (*) 70 - 99 mg/dL   BUN 9  6 - 23 mg/dL   Creatinine, Ser 6.29  0.50 - 1.10 mg/dL   Calcium 52.8  8.4 - 41.3 mg/dL   Total Protein 8.0  6.0 - 8.3 g/dL   Albumin 4.4  3.5 - 5.2 g/dL   AST 27  0 - 37 U/L   ALT 28  0 - 35 U/L   Alkaline Phosphatase 99  39 - 117 U/L   Total Bilirubin 0.5  0.3 - 1.2 mg/dL   GFR calc non Af Amer >90  >90 mL/min   GFR calc Af Amer >90  >90 mL/min  GLUCOSE, CAPILLARY     Status: Abnormal   Collection Time    02/27/13  1:32 PM      Result Value Range   Glucose-Capillary 139 (*) 70 - 99 mg/dL   Comment 1 Documented in Chart     Comment 2 Notify RN    POCT I-STAT TROPONIN I     Status: None   Collection Time    02/27/13  1:54 PM      Result Value Range   Troponin i, poc 0.00  0.00 - 0.08 ng/mL   Comment 3           URINALYSIS, ROUTINE W REFLEX MICROSCOPIC     Status: Abnormal  Collection Time    02/27/13  2:49 PM      Result Value Range   Color, Urine YELLOW  YELLOW   APPearance CLEAR  CLEAR   Specific Gravity, Urine 1.016  1.005 - 1.030   pH 5.5  5.0 - 8.0   Glucose, UA >1000 (*) NEGATIVE mg/dL   Hgb urine dipstick NEGATIVE  NEGATIVE   Bilirubin Urine NEGATIVE  NEGATIVE   Ketones, ur NEGATIVE  NEGATIVE mg/dL   Protein, ur NEGATIVE  NEGATIVE mg/dL   Urobilinogen, UA 0.2  0.0 - 1.0 mg/dL   Nitrite NEGATIVE  NEGATIVE   Leukocytes, UA NEGATIVE  NEGATIVE  URINE MICROSCOPIC-ADD ON     Status: Abnormal   Collection Time    02/27/13  2:49 PM      Result Value Range   Squamous Epithelial / LPF FEW (*) RARE   WBC, UA 0-2  <3 WBC/hpf   Bacteria, UA RARE  RARE       Gavin Pound. Oletta Lamas, MD 02/27/13 [id number]

## 2013-02-27 NOTE — ED Notes (Signed)
To dr last week and he changed her sugar meds around and they increased her meds to get fluid off  And she has frequent urination

## 2013-02-27 NOTE — ED Notes (Signed)
Has been having weakness on left side since last week  and face is numb since last night also having lower back pain not being able to walk feels weak and pain in abd   Feels like heart is skipping beats

## 2013-03-02 NOTE — ED Provider Notes (Signed)
Medical screening examination/treatment/procedure(s) were performed by non-physician practitioner and as supervising physician I was immediately available for consultation/collaboration.   Charles B. Sheldon, MD 03/02/13 0922 

## 2013-03-03 ENCOUNTER — Ambulatory Visit: Payer: Medicare Other | Admitting: Internal Medicine

## 2013-03-06 ENCOUNTER — Encounter: Payer: Medicare Other | Admitting: Obstetrics & Gynecology

## 2013-03-20 ENCOUNTER — Ambulatory Visit: Payer: Self-pay | Admitting: Neurology

## 2013-03-20 ENCOUNTER — Encounter: Payer: Self-pay | Admitting: Internal Medicine

## 2013-03-20 ENCOUNTER — Ambulatory Visit (INDEPENDENT_AMBULATORY_CARE_PROVIDER_SITE_OTHER): Payer: Medicare Other | Admitting: Internal Medicine

## 2013-03-20 VITALS — BP 132/80 | HR 86 | Temp 98.2°F | Resp 16 | Wt 267.8 lb

## 2013-03-20 DIAGNOSIS — E1129 Type 2 diabetes mellitus with other diabetic kidney complication: Secondary | ICD-10-CM

## 2013-03-20 DIAGNOSIS — E1165 Type 2 diabetes mellitus with hyperglycemia: Secondary | ICD-10-CM

## 2013-03-20 DIAGNOSIS — M48061 Spinal stenosis, lumbar region without neurogenic claudication: Secondary | ICD-10-CM

## 2013-03-20 DIAGNOSIS — F418 Other specified anxiety disorders: Secondary | ICD-10-CM

## 2013-03-20 DIAGNOSIS — F341 Dysthymic disorder: Secondary | ICD-10-CM

## 2013-03-20 DIAGNOSIS — I10 Essential (primary) hypertension: Secondary | ICD-10-CM

## 2013-03-20 MED ORDER — CANAGLIFLOZIN 300 MG PO TABS: 1.0000 | ORAL_TABLET | Freq: Every day | ORAL | Status: DC

## 2013-03-20 MED ORDER — ALPRAZOLAM 2 MG PO TABS: 2.0000 mg | ORAL_TABLET | Freq: Three times a day (TID) | ORAL | Status: DC | PRN

## 2013-03-20 MED ORDER — OXYCODONE-ACETAMINOPHEN 5-325 MG PO TABS: 1.0000 | ORAL_TABLET | ORAL | Status: DC | PRN

## 2013-03-20 NOTE — Patient Instructions (Signed)

## 2013-03-20 NOTE — Assessment & Plan Note (Signed)
She will continue xanax as needed 

## 2013-03-20 NOTE — Assessment & Plan Note (Signed)
Her insurance would not cover farxiga - will try invokana She will continue tradjenta

## 2013-03-20 NOTE — Assessment & Plan Note (Signed)
Her BP is well controlled 

## 2013-03-20 NOTE — Assessment & Plan Note (Signed)
She will take percocet as needed

## 2013-03-20 NOTE — Progress Notes (Signed)
Subjective:    Patient ID: Valerie [name], female    DOB: February 04, 1946, 67 y.o.   MRN: [id number]  Diabetes She presents for her follow-up diabetic visit. She has type 2 diabetes mellitus. Her disease course has been fluctuating. There are no hypoglycemic associated symptoms. Associated symptoms include polydipsia, polyphagia and polyuria. Pertinent negatives for diabetes include no blurred vision, no chest pain, no fatigue, no foot paresthesias, no foot ulcerations, no visual change, no weakness and no weight loss. There are no hypoglycemic complications. There are no diabetic complications. Current diabetic treatment includes oral agent (monotherapy). She is compliant with treatment most of the time. Her weight is stable. She is following a generally healthy diet. Meal planning includes avoidance of concentrated sweets. She never participates in exercise. Her home blood glucose trend is increasing steadily. Her breakfast blood glucose range is generally 180-200 mg/dl. Her lunch blood glucose range is generally 180-200 mg/dl. Her dinner blood glucose range is generally 140-180 mg/dl. Her highest blood glucose is >200 mg/dl. Her overall blood glucose range is 140-180 mg/dl. An ACE inhibitor/angiotensin II receptor blocker is being taken. She sees a podiatrist.Eye exam is current.      Review of Systems  Constitutional: Negative.  Negative for fever, chills, weight loss, diaphoresis, activity change, appetite change, fatigue and unexpected weight change.  HENT: Negative.   Eyes: Negative.  Negative for blurred vision.  Respiratory: Negative.  Negative for cough, chest tightness, shortness of breath, wheezing and stridor.   Cardiovascular: Negative.  Negative for chest pain, palpitations and leg swelling.  Gastrointestinal: Negative.   Endocrine: Positive for polydipsia, polyphagia and polyuria.  Musculoskeletal: Positive for back pain (chronic,unchanged). Negative for myalgias, joint swelling,  arthralgias and gait problem.  Skin: Negative.   Allergic/Immunologic: Negative.   Neurological: Negative.  Negative for weakness.  Hematological: Negative.  Negative for adenopathy. Does not bruise/bleed easily.  Psychiatric/Behavioral: Negative.        Objective:   Physical Exam  Vitals reviewed. Constitutional: She is oriented to person, place, and time. She appears well-developed and well-nourished. No distress.  HENT:  Head: Normocephalic and atraumatic.  Mouth/Throat: Oropharynx is clear and moist. No oropharyngeal exudate.  Eyes: Conjunctivae are normal. Right eye exhibits no discharge. Left eye exhibits no discharge. No scleral icterus.  Neck: Normal range of motion. Neck supple. No JVD present. No tracheal deviation present. No thyromegaly present.  Cardiovascular: Normal rate, regular rhythm, normal heart sounds and intact distal pulses.  Exam reveals no gallop and no friction rub.   No murmur heard. Pulmonary/Chest: Effort normal and breath sounds normal. No stridor. No respiratory distress. She has no wheezes. She has no rales. She exhibits no tenderness.  Abdominal: Soft. Bowel sounds are normal. She exhibits no distension and no mass. There is no tenderness. There is no rebound and no guarding.  Musculoskeletal: Normal range of motion. She exhibits no edema and no tenderness.  Lymphadenopathy:    She has no cervical adenopathy.  Neurological: She is oriented to person, place, and time.  Skin: Skin is warm and dry. No rash noted. She is not diaphoretic. No erythema. No pallor.  Psychiatric: She has a normal mood and affect. Her behavior is normal. Judgment and thought content normal.      Lab Results  Component Value Date   WBC 12.2* 02/27/2013   HGB 12.6 02/27/2013   HCT 38.1 02/27/2013   PLT 369 02/27/2013   GLUCOSE 126* 02/27/2013   CHOL 142 01/20/2013   TRIG 211.0* 01/20/2013  HDL 43.40 01/20/2013   LDLDIRECT 61.3 01/20/2013   ALT 28 02/27/2013   AST 27 02/27/2013   NA  140 02/27/2013   K 4.0 02/27/2013   CL 101 02/27/2013   CREATININE 0.67 02/27/2013   BUN 9 02/27/2013   CO2 27 02/27/2013   TSH 2.25 01/20/2013   INR 0.97 07/12/2010   HGBA1C 6.9* 01/20/2013   MICROALBUR 3.2* 10/17/2010      Assessment & Plan:

## 2013-03-21 ENCOUNTER — Telehealth: Payer: Self-pay

## 2013-03-21 NOTE — Telephone Encounter (Signed)
Received pharmacy rejection stating that insurance will not cover invokana without a prior authorization. PA form completed and insurance approval pending.

## 2013-03-22 ENCOUNTER — Telehealth: Payer: Self-pay | Admitting: Internal Medicine

## 2013-03-22 DIAGNOSIS — I10 Essential (primary) hypertension: Secondary | ICD-10-CM

## 2013-03-22 NOTE — Telephone Encounter (Signed)
Needs refill on benicar, please send to [facility] on [facility]

## 2013-03-23 MED ORDER — OLMESARTAN MEDOXOMIL-HCTZ 20-12.5 MG PO TABS: 1.0000 | ORAL_TABLET | Freq: Every day | ORAL | Status: DC

## 2013-03-24 ENCOUNTER — Institutional Professional Consult (permissible substitution): Payer: Medicare Other | Admitting: Pulmonary Disease

## 2013-03-29 ENCOUNTER — Ambulatory Visit: Payer: Medicare Other | Admitting: Internal Medicine

## 2013-04-14 ENCOUNTER — Ambulatory Visit (INDEPENDENT_AMBULATORY_CARE_PROVIDER_SITE_OTHER): Payer: Medicare Other | Admitting: Neurology

## 2013-04-14 ENCOUNTER — Encounter: Payer: Self-pay | Admitting: Neurology

## 2013-04-14 VITALS — BP 140/72 | HR 116 | Ht 65.0 in | Wt 272.0 lb

## 2013-04-14 DIAGNOSIS — R0989 Other specified symptoms and signs involving the circulatory and respiratory systems: Secondary | ICD-10-CM

## 2013-04-14 DIAGNOSIS — R0683 Snoring: Secondary | ICD-10-CM

## 2013-04-14 DIAGNOSIS — G4733 Obstructive sleep apnea (adult) (pediatric): Secondary | ICD-10-CM

## 2013-04-14 DIAGNOSIS — Z6841 Body Mass Index (BMI) 40.0 and over, adult: Secondary | ICD-10-CM

## 2013-04-14 DIAGNOSIS — R0609 Other forms of dyspnea: Secondary | ICD-10-CM

## 2013-04-14 NOTE — Patient Instructions (Signed)
CPAP and BIPAP  CPAP and BIPAP are methods of helping you breathe. CPAP stands for "continuous positive airway pressure." BIPAP stands for "bi-level positive airway pressure." Both CPAP and BIPAP are provided by a small machine with a flexible plastic tube that attaches to a plastic mask that goes over your nose or mouth. Air is blown into your air passages through your nose or mouth. This helps to keep your airways open and helps to keep you breathing well.  The amount of pressure that is used to blow the air into your air passages can be set on the machine. The pressure setting is based on your needs. With CPAP, the amount of pressure stays the same while you breathe in and out. With BIPAP, the amount of pressure changes when you inhale and exhale. Your caregiver will recommend whether CPAP or BIPAP would be more helpful for you.    CPAP and BIPAP can be helpful for both adults and children with:   Sleep apnea.   Chronic Obstructive Pulmonary Disease (COPD), a condition like emphysema.   Diseases which weaken the muscles of the chest such as muscular dystrophy or neurological diseases.   Other problems that cause breathing to be weak or difficult.  USE OF CPAP OR BIPAP  The respiratory therapist or technician will help you get used to wearing the mask. Some people feel claustrophobic (a trapped or closed in feeling) at first, because the mask needs to be fairly snug on your face.     It may help you to get used to the mask gradually, by first holding the mask loosely over your nose or mouth using a low pressure setting on the machine. Gradually the mask can be applied more snugly with increased pressure. You can also gradually increase the amount of time the mask is used.   People with sleep apnea will use the mask and machine at night when they are sleeping. Others, like those with ALS or other breathing difficulties, may need the CPAP or BIPAP all the time.    If the first mask you try does not fit well, or is uncomfortable, there are other types and sizes that can be tried.   If you tend to breathe through your mouth, a chin strap may be applied to help keep your mouth closed (if you are using a nasal mask).   The CPAP and BIPAP machines have alarms that may sound if the mask comes off or develops a leak.   You should not eat or drink while the CPAP or BIPAP is on. Food or fluids could get pushed into your lungs by the pressure of the CPAP or BIPAP.  Sometimes CPAP or BIPAP machines are ordered for home use. If you are going to use the CPAP or BIPAP machine at home, follow these instructions   CPAP or BIPAP machines can be rented or purchased through home health care companies. There are many different brands of machines available. If you rent a machine before purchasing you may find which particular machine works well for you.   Ask questions if there is something you do not understand when picking out your machine.   Place your CPAP or BIPAP machine on a secure table or stand near an electrical outlet.   Know where the On/Off switch is.   Follow your doctor's instructions for how to set the pressure on your machine and when you should use it.   Do not smoke! Tobacco smoke residue can damage the   machine.  SEEK IMMEDIATE MEDICAL CARE IF:     You have redness or open areas around your nose or mouth.   You have trouble operating the CPAP or BIPAP machine.   You cannot tolerate wearing the CPAP or BIPAP mask.   You have any questions or concerns.  Document Released: 08/28/2004 Document Revised: 02/22/2012 Document Reviewed: 11/27/2008  [employer] Patient Information 2013 [employer].

## 2013-04-14 NOTE — Progress Notes (Signed)
Chief Complaint  Valerie [name] presents with  . Neurologic Problem    Sleep problem....RM#11   [facility]  Provider:  Dr Brett [name] Referring Provider: Janith Lima, MD Primary Care Physician:  Valerie Calico, MD  Chief Complaint  Valerie [name] presents with  . Neurologic Problem    Sleep problem....RM#11    HPI:  Valerie Valerie [name] is a 67 y.o. female here as a referral from Valerie. Ronnald [name] / Valerie Valerie Valerie [name].  Valerie Valerie [name] is a mean of 67 year old African American right-handed lady Valerie [name] of Valerie. Floyde [name] and Valerie. wander [name]. Valerie Valerie [name] has a past medical history of morbid obesity and sleep apnea for which Valerie. Jannifer Valerie [name] referred her for an evaluation Valerie Valerie [name] had at Valerie time are ready ongoing therapy as a BiPAP machine but had not been reevaluated in many years. She reported increasing fatigue and excessive daytime sleepiness Valerie. Jannifer Valerie [name] had evaluated her memory complaints and found small vessel disease to a mild extent on a CAT scan. Valerie Valerie [name] was supposed to see Valerie. Jannifer Valerie [name] in [place] 2014. I have seen Valerie Valerie [name] in that MD December 2000 1340 consult proceed study revealed a very high AHI of 76.1 she owns a CPAP that was set at 17/11 cm water for a sleep study also short severe oxygen desaturations at night. At worst is today points on a, so she is no longer excessively daytime sleepy but if her fatigue severity is and/or step 63 points, Valerie maximum. Valerie Valerie [name] is titration showed that she had at Valerie very best response to 13 cm water over 8 cm in a BiPAP function Valerie Valerie [name] had failed a nasal mask before , thus  preferred to Valerie full face mask. At pulse oximetry done on BiPAP revealed a dry hypoxemia is not corrected. Valerie Valerie [name] is here for a compliance visit. Valerie Valerie [name]'s revisit was scheduled for December 2014. Valerie Valerie [name] reports that her husband complains of her  ongoing snoring while on BiPAP.  Valerie Valerie [name] is to fluoxetine at 40 mg tabs, I wouldn't supplement, Xanax at 2 mg tabs Ventolin  inhaler  25 mg tabs. Twice a day.  Review of Systems: Out of a complete 14 system review, Valerie Valerie [name] complains of only Valerie following symptoms, and all other reviewed systems are negative.   History   Social History  . Marital Status: Married    Spouse Name: N/A    Number of Children: N/A  . Years of Education: N/A   Occupational History  . disabled    Social History Main Topics  . Smoking status: Never Smoker   . Smokeless tobacco: Never Used  . Alcohol Use: No  . Drug Use: No  . Sexually Active: Not Currently   Other Topics Concern  . Not on file   Social History Narrative  . No narrative on file    Family History  Problem Relation Age of Onset  . Hypertension      family history  . Alcohol abuse    . Alcohol abuse Mother   . Heart attack Mother   . Coronary artery disease Brother   . Heart attack Father   . Heart disease Sister   . Atrial fibrillation Sister   . Hypertension Sister     Past Medical History  Diagnosis Date  . Anemia   . Depression   . Diabetes mellitus, type 2   . GERD (gastroesophageal reflux disease)   . Hyperlipidemia   . Hypertension   . Sleep apnea   .  Asthma   . Hemorrhoids   . Fatigue   . Snoring disorder     Past Surgical History  Procedure Laterality Date  . Benign tumors resected    . Tubal ligation    . Rotator cuff repair    . Cataract extraction Left   . Refractive surgery      Glaucoma    Current Outpatient Prescriptions  Medication Sig Dispense Refill  . albuterol (VENTOLIN HFA) 108 (90 BASE) MCG/ACT inhaler Inhale 2 puffs into Valerie lungs every 6 (six) hours as needed. For shortness of breath      . alprazolam (XANAX) 2 MG tablet Take 1 tablet (2 mg total) by mouth 3 (three) times daily as needed. For anxiety  65 tablet  2  . amLODipine (NORVASC) 10 MG tablet Take 10 mg by mouth daily.       . Canagliflozin (INVOKANA) 300 MG TABS Take 1 tablet by mouth daily.  90 tablet  3  . carvedilol (COREG) 25 MG tablet  Take 25 mg by mouth 2 (two) times daily with a meal.      . erythromycin base (E-MYCIN) 500 MG tablet Take 500 mg by mouth daily.      . ferrous sulfate 325 (65 Valerie) MG tablet Take 325 mg by mouth daily with breakfast.      . FLUoxetine (PROZAC) 40 MG capsule Take 40 mg by mouth daily.      . hydrOXYzine (ATARAX/VISTARIL) 10 MG tablet       . lamoTRIgine (LAMICTAL) 200 MG tablet Take 200 mg by mouth daily.       [name] latanoprost (XALATAN) 0.005 % ophthalmic solution Place 1 drop into both eyes at bedtime.      [name] linagliptin (TRADJENTA) 5 MG TABS tablet Take 1 tablet (5 mg total) by mouth daily.  84 tablet  0  . Mometasone Furo-Formoterol Fum 200-5 MCG/ACT AERO Inhale 2 Act into Valerie lungs 2 (two) times daily.  3 Inhaler  0  . montelukast (SINGULAIR) 10 MG tablet Take 10 mg by mouth at bedtime.        [name] olmesartan-hydrochlorothiazide (BENICAR HCT) 20-12.5 MG per tablet Take 1 tablet by mouth daily.  30 tablet  1  . omeprazole (PRILOSEC) 40 MG capsule Take 1 capsule (40 mg total) by mouth daily.  90 capsule  3  . oxyCODONE-acetaminophen (PERCOCET) 5-325 MG per tablet Take 1 tablet by mouth every 4 (four) hours as needed for pain.  75 tablet  0  . rosuvastatin (CRESTOR) 10 MG tablet Take 10 mg by mouth daily.         No current facility-administered medications for this visit.    Allergies as of 04/14/2013 - Review Complete 04/14/2013  Allergen Reaction Noted  . Metformin and related  02/23/2013  . Food Swelling 05/04/2012  . Penicillins Swelling and Rash     Vitals: BP 140/72  Pulse 116  Ht '5\' 5"'$  (1.651 m)  Wt 272 lb (123.378 kg)  BMI 45.26 kg/m2 Last Weight:  Wt Readings from Last 1 Encounters:  04/14/13 272 lb (123.378 kg)   Last Height:   Ht Readings from Last 1 Encounters:  04/14/13 '5\' 5"'$  (1.651 m)     Physical exam:  General: Valerie Valerie [name] is awake, alert and appears not in acute distress. Valerie Valerie [name] is well groomed. Head: Normocephalic, atraumatic. Neck is supple.  Mallampati3, neck circumference:17,5. Cardiovascular:  Regular rate and rhythm, without  murmurs or carotid bruit, and without distended neck veins. Respiratory:  Lungs are clear to auscultation. Skin:  Without evidence of edema, or rash Trunk: BMI  Of   46.7 , morbidly obese.  elevated and Valerie [name]  has normal posture.  Mental Status: Alert, oriented, thought content appropriate.  Speech fluent without evidence of aphasia. Able to follow 3 step commands without difficulty. Cranial Nerves: II-Discs flat bilaterally. Visual fields grossly intact. III/IV/VI-Extraocular movements intact.  Pupils reactive bilaterally. V/VII-Smile symmetric VIII-grossly intact IX/X-normal gag XI-bilateral shoulder shrug XII-midline tongue extension Motor: 5/5 bilaterally with normal tone and bulk Sensory: Pinprick and light touch intact throughout, bilaterally Deep Tendon Reflexes: 2+ and symmetric throughout Plantars: Downgoing bilaterally Cerebellar: Normal finger-to-nose, normal rapid alternating movements and normal heel-to-shin test.  Wide  based gait and station.    Valerie [name] uses a new [employer]age full face mask.   Valerie BiPAP machine is not data downloadable, but she  owns this one. and looking at her mask. I do think that she should achieve a good seal.  In order to or understand why Valerie Valerie [name] would still be snoring while while on BiPAP we may have to fit her with a little machine that is downloadable. I've asked Valerie sleep lab to investigate if Valerie Valerie [name] can be directly prescribed a new BiPAP machine that has downloadable capacity.  BMI is main risk factor.

## 2013-04-19 ENCOUNTER — Telehealth: Payer: Self-pay | Admitting: *Deleted

## 2013-04-19 NOTE — Telephone Encounter (Signed)
Called patient to schedule BipapTitration.  She states that she is unable to schedule at this time.  Her husband has to have a few surgeries and she will need to wait until after his procedures.  States she will call back to schedule.

## 2013-04-21 ENCOUNTER — Ambulatory Visit: Payer: Medicare Other | Admitting: Internal Medicine

## 2013-05-22 ENCOUNTER — Ambulatory Visit: Payer: Self-pay | Admitting: Neurology

## 2013-06-23 ENCOUNTER — Ambulatory Visit: Payer: Medicare Other | Admitting: Internal Medicine

## 2013-07-03 ENCOUNTER — Ambulatory Visit: Payer: Medicare Other | Admitting: Internal Medicine

## 2013-07-17 ENCOUNTER — Encounter: Payer: Self-pay | Admitting: Internal Medicine

## 2013-07-17 ENCOUNTER — Other Ambulatory Visit (INDEPENDENT_AMBULATORY_CARE_PROVIDER_SITE_OTHER): Payer: Medicare Other

## 2013-07-17 ENCOUNTER — Ambulatory Visit (INDEPENDENT_AMBULATORY_CARE_PROVIDER_SITE_OTHER): Payer: Medicare Other | Admitting: Internal Medicine

## 2013-07-17 VITALS — BP 110/80 | HR 87 | Temp 97.8°F | Resp 16 | Wt 277.0 lb

## 2013-07-17 DIAGNOSIS — E1165 Type 2 diabetes mellitus with hyperglycemia: Secondary | ICD-10-CM

## 2013-07-17 DIAGNOSIS — I10 Essential (primary) hypertension: Secondary | ICD-10-CM

## 2013-07-17 DIAGNOSIS — E785 Hyperlipidemia, unspecified: Secondary | ICD-10-CM

## 2013-07-17 DIAGNOSIS — F341 Dysthymic disorder: Secondary | ICD-10-CM

## 2013-07-17 DIAGNOSIS — E039 Hypothyroidism, unspecified: Secondary | ICD-10-CM

## 2013-07-17 DIAGNOSIS — F418 Other specified anxiety disorders: Secondary | ICD-10-CM

## 2013-07-17 DIAGNOSIS — Z6841 Body Mass Index (BMI) 40.0 and over, adult: Secondary | ICD-10-CM

## 2013-07-17 DIAGNOSIS — Z0000 Encounter for general adult medical examination without abnormal findings: Secondary | ICD-10-CM

## 2013-07-17 DIAGNOSIS — E1129 Type 2 diabetes mellitus with other diabetic kidney complication: Secondary | ICD-10-CM

## 2013-07-17 LAB — COMPREHENSIVE METABOLIC PANEL
ALT: 32 U/L (ref 0–35)
AST: 32 U/L (ref 0–37)
Albumin: 4.2 g/dL (ref 3.5–5.2)
Alkaline Phosphatase: 77 U/L (ref 39–117)
BUN: 9 mg/dL (ref 6–23)
CO2: 30 mEq/L (ref 19–32)
Calcium: 9.3 mg/dL (ref 8.4–10.5)
Chloride: 104 mEq/L (ref 96–112)
Creatinine, Ser: 0.7 mg/dL (ref 0.4–1.2)
GFR: 106.13 mL/min (ref >60.00)
Glucose, Bld: 139 mg/dL — ABNORMAL HIGH (ref 70–99)
Potassium: 3.4 mEq/L — ABNORMAL LOW (ref 3.5–5.1)
Sodium: 142 mEq/L (ref 135–145)
Total Bilirubin: 0.6 mg/dL (ref 0.3–1.2)
Total Protein: 7.7 g/dL (ref 6.0–8.3)

## 2013-07-17 LAB — CBC WITH DIFFERENTIAL/PLATELET
Basophils Absolute: 0 10*3/uL (ref 0.0–0.1)
Basophils Relative: 0.3 % (ref 0.0–3.0)
Eosinophils Absolute: 0.1 10*3/uL (ref 0.0–0.7)
Eosinophils Relative: 1.3 % (ref 0.0–5.0)
HCT: 36.3 % (ref 36.0–46.0)
Hemoglobin: 11.9 g/dL — ABNORMAL LOW (ref 12.0–15.0)
Lymphocytes Relative: 23.6 % (ref 12.0–46.0)
Lymphs Abs: 2.7 10*3/uL (ref 0.7–4.0)
MCHC: 32.7 g/dL (ref 30.0–36.0)
MCV: 89.3 fl (ref 78.0–100.0)
Monocytes Absolute: 0.9 10*3/uL (ref 0.1–1.0)
Monocytes Relative: 7.6 % (ref 3.0–12.0)
Neutro Abs: 7.6 10*3/uL (ref 1.4–7.7)
Neutrophils Relative %: 67.2 % (ref 43.0–77.0)
Platelets: 319 10*3/uL (ref 150.0–400.0)
RBC: 4.07 Mil/uL (ref 3.87–5.11)
RDW: 16 % — ABNORMAL HIGH (ref 11.5–14.6)
WBC: 11.4 10*3/uL — ABNORMAL HIGH (ref 4.5–10.5)

## 2013-07-17 LAB — GLUCOSE, POCT (MANUAL RESULT ENTRY): POC Glucose: 180 mg/dl — AB (ref 70–99)

## 2013-07-17 LAB — HEMOGLOBIN A1C: Hgb A1c MFr Bld: 7.8 % — ABNORMAL HIGH (ref 4.6–6.5)

## 2013-07-17 MED ORDER — LINAGLIPTIN-METFORMIN HCL 2.5-500 MG PO TABS: 1.0000 | ORAL_TABLET | Freq: Two times a day (BID) | ORAL | Status: DC

## 2013-07-17 MED ORDER — CANAGLIFLOZIN 300 MG PO TABS: 1.0000 | ORAL_TABLET | Freq: Every day | ORAL | Status: DC

## 2013-07-17 NOTE — Progress Notes (Signed)
Subjective:    Patient ID: Valerie [name], female    DOB: 1946/05/14, 67 y.o.   MRN: [id number]  Diabetes She presents for her follow-up diabetic visit. She has type 2 diabetes mellitus. Her disease course has been worsening. Hypoglycemia symptoms include nervousness/anxiousness. Pertinent negatives for hypoglycemia include no confusion or dizziness. Associated symptoms include polydipsia, polyphagia and polyuria. Pertinent negatives for diabetes include no blurred vision, no chest pain, no fatigue, no foot paresthesias, no foot ulcerations, no visual change, no weakness and no weight loss. There are no hypoglycemic complications. Symptoms are worsening. There are no diabetic complications. Current diabetic treatment includes oral agent (triple therapy). She is compliant with treatment some of the time. Her weight is increasing steadily. She is following a generally unhealthy diet. When asked about meal planning, she reported none. She has not had a previous visit with a dietician. She never participates in exercise. Her home blood glucose trend is increasing steadily. An ACE inhibitor/angiotensin II receptor blocker is being taken. She does not see a podiatrist.Eye exam is current.      Review of Systems  Constitutional: Negative.  Negative for fever, chills, weight loss, diaphoresis, activity change, appetite change, fatigue and unexpected weight change.  HENT: Negative.   Eyes: Negative.  Negative for blurred vision.  Respiratory: Negative.  Negative for cough, choking, shortness of breath and stridor.   Cardiovascular: Negative.  Negative for chest pain, palpitations and leg swelling.  Gastrointestinal: Negative.  Negative for nausea, vomiting, abdominal pain, diarrhea and constipation.  Endocrine: Positive for polydipsia, polyphagia and polyuria.  Musculoskeletal: Positive for back pain. Negative for myalgias, joint swelling and gait problem.  Skin: Negative.   Allergic/Immunologic:  Negative.   Neurological: Negative.  Negative for dizziness and weakness.  Hematological: Negative.  Negative for adenopathy. Does not bruise/bleed easily.  Psychiatric/Behavioral: Positive for sleep disturbance. Negative for suicidal ideas, hallucinations, behavioral problems, confusion, self-injury, decreased concentration and agitation. The patient is nervous/anxious. The patient is not hyperactive.        Objective:   Physical Exam  Vitals reviewed. Constitutional: She is oriented to person, place, and time. She appears well-developed and well-nourished. No distress.  HENT:  Head: Normocephalic and atraumatic.  Mouth/Throat: Oropharynx is clear and moist. No oropharyngeal exudate.  Eyes: Conjunctivae are normal. Right eye exhibits no discharge. Left eye exhibits no discharge. No scleral icterus.  Neck: Normal range of motion. Neck supple. No JVD present. No tracheal deviation present. No thyromegaly present.  Cardiovascular: Normal rate, regular rhythm, normal heart sounds and intact distal pulses.  Exam reveals no gallop and no friction rub.   No murmur heard. Pulmonary/Chest: Effort normal and breath sounds normal. No stridor. No respiratory distress. She has no wheezes. She has no rales. She exhibits no tenderness.  Abdominal: Soft. Bowel sounds are normal. She exhibits no distension and no mass. There is no tenderness. There is no rebound and no guarding.  Musculoskeletal: Normal range of motion. She exhibits no edema and no tenderness.  Lymphadenopathy:    She has no cervical adenopathy.  Neurological: She is oriented to person, place, and time.  Skin: Skin is warm and dry. No rash noted. She is not diaphoretic. No erythema. No pallor.  Psychiatric: She has a normal mood and affect. Her behavior is normal. Judgment and thought content normal.     Lab Results  Component Value Date   WBC 12.2* 02/27/2013   HGB 12.6 02/27/2013   HCT 38.1 02/27/2013   PLT 369 02/27/2013  GLUCOSE  126* 02/27/2013   CHOL 142 01/20/2013   TRIG 211.0* 01/20/2013   HDL 43.40 01/20/2013   LDLDIRECT 61.3 01/20/2013   ALT 28 02/27/2013   AST 27 02/27/2013   NA 140 02/27/2013   K 4.0 02/27/2013   CL 101 02/27/2013   CREATININE 0.67 02/27/2013   BUN 9 02/27/2013   CO2 27 02/27/2013   TSH 2.25 01/20/2013   INR 0.97 07/12/2010   HGBA1C 6.9* 01/20/2013   MICROALBUR 3.2* 10/17/2010       Assessment & Plan:

## 2013-07-17 NOTE — Patient Instructions (Addendum)
Type 2 Diabetes Mellitus, Adult Type 2 diabetes mellitus, often simply referred to as type 2 diabetes, is a long-lasting (chronic) disease. In type 2 diabetes, the pancreas does not make enough insulin (a hormone), the cells are less responsive to the insulin that is made (insulin resistance), or both. Normally, insulin moves sugars from food into the tissue cells. The tissue cells use the sugars for energy. The lack of insulin or the lack of normal response to insulin causes excess sugars to build up in the blood instead of going into the tissue cells. As a result, high blood sugar (hyperglycemia) develops. The effect of high sugar (glucose) levels can cause many complications. Type 2 diabetes was also previously called adult-onset diabetes but it can occur at any age.  RISK FACTORS  A person is predisposed to developing type 2 diabetes if someone in the family has the disease and also has one or more of the following primary risk factors:  Overweight.  An inactive lifestyle.  A history of consistently eating high-calorie foods. Maintaining a normal weight and regular physical activity can reduce the chance of developing type 2 diabetes. SYMPTOMS  A person with type 2 diabetes may not show symptoms initially. The symptoms of type 2 diabetes appear slowly. The symptoms include:  Increased thirst (polydipsia).  Increased urination (polyuria).  Increased urination during the night (nocturia).  Weight loss. This weight loss may be rapid.  Frequent, recurring infections.  Tiredness (fatigue).  Weakness.  Vision changes, such as blurred vision.  Fruity smell to your breath.  Abdominal pain.  Nausea or vomiting.  Cuts or bruises which are slow to heal.  Tingling or numbness in the hands or feet. DIAGNOSIS Type 2 diabetes is frequently not diagnosed until complications of diabetes are present. Type 2 diabetes is diagnosed when symptoms or complications are present and when blood  glucose levels are increased. Your blood glucose level may be checked by one or more of the following blood tests:  A fasting blood glucose test. You will not be allowed to eat for at least 8 hours before a blood sample is taken.  A random blood glucose test. Your blood glucose is checked at any time of the day regardless of when you ate.  A hemoglobin A1c blood glucose test. A hemoglobin A1c test provides information about blood glucose control over the previous 3 months.  An oral glucose tolerance test (OGTT). Your blood glucose is measured after you have not eaten (fasted) for 2 hours and then after you drink a glucose-containing beverage. TREATMENT   You may need to take insulin or diabetes medicine daily to keep blood glucose levels in the desired range.  You will need to match insulin dosing with exercise and healthy food choices. The treatment goal is to maintain the before meal blood sugar (preprandial glucose) level at 70 130 mg/dL. HOME CARE INSTRUCTIONS   Have your hemoglobin A1c level checked twice a year.  Perform daily blood glucose monitoring as directed by your caregiver.  Monitor urine ketones when you are ill and as directed by your caregiver.  Take your diabetes medicine or insulin as directed by your caregiver to maintain your blood glucose levels in the desired range.  Never run out of diabetes medicine or insulin. It is needed every day.  Adjust insulin based on your intake of carbohydrates. Carbohydrates can raise blood glucose levels but need to be included in your diet. Carbohydrates provide vitamins, minerals, and fiber which are an essential part of   a healthy diet. Carbohydrates are found in fruits, vegetables, whole grains, dairy products, legumes, and foods containing added sugars.    Eat healthy foods. Alternate 3 meals with 3 snacks.  Lose weight if overweight.  Carry a medical alert card or wear your medical alert jewelry.  Carry a 15 gram  carbohydrate snack with you at all times to treat low blood glucose (hypoglycemia). Some examples of 15 gram carbohydrate snacks include:  Glucose tablets, 3 or 4   Glucose gel, 15 gram tube  Raisins, 2 tablespoons (24 grams)  Jelly beans, 6  Animal crackers, 8  Regular pop, 4 ounces (120 mL)  Gummy treats, 9  Recognize hypoglycemia. Hypoglycemia occurs with blood glucose levels of 70 mg/dL and below. The risk for hypoglycemia increases when fasting or skipping meals, during or after intense exercise, and during sleep. Hypoglycemia symptoms can include:  Tremors or shakes.  Decreased ability to concentrate.  Sweating.  Increased heart rate.  Headache.  Dry mouth.  Hunger.  Irritability.  Anxiety.  Restless sleep.  Altered speech or coordination.  Confusion.  Treat hypoglycemia promptly. If you are alert and able to safely swallow, follow the 15:15 rule:  Take 15 20 grams of rapid-acting glucose or carbohydrate. Rapid-acting options include glucose gel, glucose tablets, or 4 ounces (120 mL) of fruit juice, regular soda, or low fat milk.  Check your blood glucose level 15 minutes after taking the glucose.  Take 15 20 grams more of glucose if the repeat blood glucose level is still 70 mg/dL or below.  Eat a meal or snack within 1 hour once blood glucose levels return to normal.    Be alert to polyuria and polydipsia which are early signs of hyperglycemia. An early awareness of hyperglycemia allows for prompt treatment. Treat hyperglycemia as directed by your caregiver.  Engage in at least 150 minutes of moderate-intensity physical activity a week, spread over at least 3 days of the week or as directed by your caregiver. In addition, you should engage in resistance exercise at least 2 times a week or as directed by your caregiver.  Adjust your medicine and food intake as needed if you start a new exercise or sport.  Follow your sick day plan at any time you  are unable to eat or drink as usual.  Avoid tobacco use.  Limit alcohol intake to no more than 1 drink per day for nonpregnant women and 2 drinks per day for men. You should drink alcohol only when you are also eating food. Talk with your caregiver whether alcohol is safe for you. Tell your caregiver if you drink alcohol several times a week.  Follow up with your caregiver regularly.  Schedule an eye exam soon after the diagnosis of type 2 diabetes and then annually.  Perform daily skin and foot care. Examine your skin and feet daily for cuts, bruises, redness, nail problems, bleeding, blisters, or sores. A foot exam by a caregiver should be done annually.  Brush your teeth and gums at least twice a day and floss at least once a day. Follow up with your dentist regularly.  Share your diabetes management plan with your workplace or school.  Stay up-to-date with immunizations.  Learn to manage stress.  Obtain ongoing diabetes education and support as needed.  Participate in, or seek rehabilitation as needed to maintain or improve independence and quality of life. Request a physical or occupational therapy referral if you are having foot or hand numbness or difficulties with grooming,   dressing, eating, or physical activity. SEEK MEDICAL CARE IF:   You are unable to eat food or drink fluids for more than 6 hours.  You have nausea and vomiting for more than 6 hours.  Your blood glucose level is over 240 mg/dL.  There is a change in mental status.  You develop an additional serious illness.  You have diarrhea for more than 6 hours.  You have been sick or have had a fever for a couple of days and are not getting better.  You have pain during any physical activity.  SEEK IMMEDIATE MEDICAL CARE IF:  You have difficulty breathing.  You have moderate to large ketone levels. MAKE SURE YOU:  Understand these instructions.  Will watch your condition.  Will get help right away if  you are not doing well or get worse. Document Released: 11/30/2005 Document Revised: 08/24/2012 Document Reviewed: 06/28/2012 [employer] Patient Information 2014 [employer].  

## 2013-07-18 NOTE — Assessment & Plan Note (Signed)
Her A1C has gone up, she is not very compliant with her meds, one of the issues is cost but she continue to gain weight as well I will add metformin to the tradjenta I gave her samples of invokana Will recheck her renal function today

## 2013-07-18 NOTE — Assessment & Plan Note (Signed)
Goal achieved 

## 2013-07-18 NOTE — Assessment & Plan Note (Signed)
Her BP is well controlled 

## 2013-07-18 NOTE — Assessment & Plan Note (Signed)
She has not been able to make any progress on this yet

## 2013-07-24 ENCOUNTER — Other Ambulatory Visit: Payer: Self-pay | Admitting: Internal Medicine

## 2013-08-15 ENCOUNTER — Other Ambulatory Visit: Payer: Self-pay

## 2013-08-15 MED ORDER — ALBUTEROL SULFATE HFA 108 (90 BASE) MCG/ACT IN AERS: 2.0000 | INHALATION_SPRAY | Freq: Four times a day (QID) | RESPIRATORY_TRACT | Status: DC | PRN

## 2013-08-18 ENCOUNTER — Telehealth: Payer: Self-pay

## 2013-08-18 MED ORDER — ALBUTEROL SULFATE HFA 108 (90 BASE) MCG/ACT IN AERS: 2.0000 | INHALATION_SPRAY | Freq: Four times a day (QID) | RESPIRATORY_TRACT | Status: DC | PRN

## 2013-08-18 NOTE — Telephone Encounter (Signed)
Faxed refill request   

## 2013-08-26 ENCOUNTER — Other Ambulatory Visit: Payer: Self-pay | Admitting: Internal Medicine

## 2013-09-01 ENCOUNTER — Ambulatory Visit (INDEPENDENT_AMBULATORY_CARE_PROVIDER_SITE_OTHER): Payer: Medicare Other | Admitting: Internal Medicine

## 2013-09-01 ENCOUNTER — Encounter: Payer: Self-pay | Admitting: Internal Medicine

## 2013-09-01 ENCOUNTER — Other Ambulatory Visit (INDEPENDENT_AMBULATORY_CARE_PROVIDER_SITE_OTHER): Payer: Medicare Other

## 2013-09-01 VITALS — BP 108/66 | HR 88 | Temp 98.6°F | Resp 16 | Wt 267.0 lb

## 2013-09-01 DIAGNOSIS — E1129 Type 2 diabetes mellitus with other diabetic kidney complication: Secondary | ICD-10-CM

## 2013-09-01 DIAGNOSIS — Z23 Encounter for immunization: Secondary | ICD-10-CM

## 2013-09-01 DIAGNOSIS — I10 Essential (primary) hypertension: Secondary | ICD-10-CM

## 2013-09-01 DIAGNOSIS — E1165 Type 2 diabetes mellitus with hyperglycemia: Secondary | ICD-10-CM

## 2013-09-01 LAB — BASIC METABOLIC PANEL
BUN: 15 mg/dL (ref 6–23)
CO2: 28 mEq/L (ref 19–32)
Calcium: 9.8 mg/dL (ref 8.4–10.5)
Chloride: 101 mEq/L (ref 96–112)
Creatinine, Ser: 0.8 mg/dL (ref 0.4–1.2)
GFR: 89.84 mL/min (ref >60.00)
Glucose, Bld: 121 mg/dL — ABNORMAL HIGH (ref 70–99)
Potassium: 3.7 mEq/L (ref 3.5–5.1)
Sodium: 139 mEq/L (ref 135–145)

## 2013-09-01 LAB — GLUCOSE, POCT (MANUAL RESULT ENTRY): POC Glucose: 155 mg/dl — AB (ref 70–99)

## 2013-09-01 MED ORDER — LINAGLIPTIN 5 MG PO TABS: 5.0000 mg | ORAL_TABLET | Freq: Every day | ORAL | Status: DC

## 2013-09-01 NOTE — Progress Notes (Signed)
Subjective:    Patient ID: Valerie [name], female    DOB: May 14, 1946, 67 y.o.   MRN: [id number]  Diabetes She presents for her follow-up diabetic visit. She has type 2 diabetes mellitus. Her disease course has been fluctuating. Hypoglycemia symptoms include dizziness. Pertinent negatives for hypoglycemia include no headaches, seizures, speech difficulty or tremors. Associated symptoms include polyuria. Pertinent negatives for diabetes include no blurred vision, no chest pain, no fatigue, no foot paresthesias, no foot ulcerations, no polydipsia, no polyphagia, no visual change, no weakness and no weight loss. There are no hypoglycemic complications. There are no diabetic complications. Current diabetic treatment includes oral agent (triple therapy). She is compliant with treatment some of the time. Her weight is stable. She is following a generally unhealthy diet. When asked about meal planning, she reported none. She has not had a previous visit with a dietician. She never participates in exercise. There is no change in her home blood glucose trend. Her breakfast blood glucose range is generally 110-130 mg/dl. Her lunch blood glucose range is generally 130-140 mg/dl. Her dinner blood glucose range is generally 140-180 mg/dl. Her highest blood glucose is >200 mg/dl. Her overall blood glucose range is 140-180 mg/dl. An ACE inhibitor/angiotensin II receptor blocker is being taken. She does not see a podiatrist.Eye exam is current.      Review of Systems  Constitutional: Negative.  Negative for fever, chills, weight loss, diaphoresis, activity change, appetite change, fatigue and unexpected weight change.  HENT: Negative.   Eyes: Negative.  Negative for blurred vision.  Respiratory: Negative.  Negative for cough, chest tightness, shortness of breath and wheezing.   Cardiovascular: Negative.  Negative for chest pain, palpitations and leg swelling.  Gastrointestinal: Positive for diarrhea. Negative for  nausea, vomiting, abdominal pain, constipation, abdominal distention, anal bleeding and rectal pain.       She feels like metformin has caused diarrhea and dizziness  Endocrine: Positive for polyuria. Negative for polydipsia and polyphagia.  Musculoskeletal: Negative.   Skin: Negative.   Allergic/Immunologic: Negative.   Neurological: Positive for dizziness. Negative for tremors, seizures, syncope, facial asymmetry, speech difficulty, weakness, light-headedness, numbness and headaches.  Hematological: Negative.  Negative for adenopathy. Does not bruise/bleed easily.  Psychiatric/Behavioral: Negative.        Objective:   Physical Exam  Vitals reviewed. Constitutional: She is oriented to person, place, and time. She appears well-developed and well-nourished. No distress.  HENT:  Head: Normocephalic and atraumatic.  Mouth/Throat: Oropharynx is clear and moist. No oropharyngeal exudate.  Eyes: Conjunctivae are normal. Right eye exhibits no discharge. Left eye exhibits no discharge. No scleral icterus.  Neck: Normal range of motion. Neck supple. No JVD present. No tracheal deviation present. No thyromegaly present.  Cardiovascular: Normal rate, regular rhythm, normal heart sounds and intact distal pulses.  Exam reveals no gallop and no friction rub.   No murmur heard. Pulmonary/Chest: Effort normal and breath sounds normal. No stridor. No respiratory distress. She has no wheezes. She has no rales. She exhibits no tenderness.  Abdominal: Soft. Bowel sounds are normal. She exhibits no distension and no mass. There is no tenderness. There is no rebound and no guarding.  Musculoskeletal: Normal range of motion. She exhibits no edema and no tenderness.  Lymphadenopathy:    She has no cervical adenopathy.  Neurological: She is oriented to person, place, and time.  Skin: Skin is warm and dry. No rash noted. She is not diaphoretic. No erythema. No pallor.  Psychiatric: She has a normal mood  and  affect. Her behavior is normal. Judgment and thought content normal.     Lab Results  Component Value Date   WBC 11.4* 07/17/2013   HGB 11.9* 07/17/2013   HCT 36.3 07/17/2013   PLT 319.0 07/17/2013   GLUCOSE 139* 07/17/2013   CHOL 142 01/20/2013   TRIG 211.0* 01/20/2013   HDL 43.40 01/20/2013   LDLDIRECT 61.3 01/20/2013   ALT 32 07/17/2013   AST 32 07/17/2013   NA 142 07/17/2013   K 3.4* 07/17/2013   CL 104 07/17/2013   CREATININE 0.7 07/17/2013   BUN 9 07/17/2013   CO2 30 07/17/2013   TSH 2.25 01/20/2013   INR 0.97 07/12/2010   HGBA1C 7.8* 07/17/2013   MICROALBUR 3.2* 10/17/2010       Assessment & Plan:

## 2013-09-01 NOTE — Patient Instructions (Signed)
Type 2 Diabetes Mellitus, Adult Type 2 diabetes mellitus, often simply referred to as type 2 diabetes, is a long-lasting (chronic) disease. In type 2 diabetes, the pancreas does not make enough insulin (a hormone), the cells are less responsive to the insulin that is made (insulin resistance), or both. Normally, insulin moves sugars from food into the tissue cells. The tissue cells use the sugars for energy. The lack of insulin or the lack of normal response to insulin causes excess sugars to build up in the blood instead of going into the tissue cells. As a result, high blood sugar (hyperglycemia) develops. The effect of high sugar (glucose) levels can cause many complications. Type 2 diabetes was also previously called adult-onset diabetes but it can occur at any age.  RISK FACTORS  A person is predisposed to developing type 2 diabetes if someone in the family has the disease and also has one or more of the following primary risk factors:  Overweight.  An inactive lifestyle.  A history of consistently eating high-calorie foods. Maintaining a normal weight and regular physical activity can reduce the chance of developing type 2 diabetes. SYMPTOMS  A person with type 2 diabetes may not show symptoms initially. The symptoms of type 2 diabetes appear slowly. The symptoms include:  Increased thirst (polydipsia).  Increased urination (polyuria).  Increased urination during the night (nocturia).  Weight loss. This weight loss may be rapid.  Frequent, recurring infections.  Tiredness (fatigue).  Weakness.  Vision changes, such as blurred vision.  Fruity smell to your breath.  Abdominal pain.  Nausea or vomiting.  Cuts or bruises which are slow to heal.  Tingling or numbness in the hands or feet. DIAGNOSIS Type 2 diabetes is frequently not diagnosed until complications of diabetes are present. Type 2 diabetes is diagnosed when symptoms or complications are present and when blood  glucose levels are increased. Your blood glucose level may be checked by one or more of the following blood tests:  A fasting blood glucose test. You will not be allowed to eat for at least 8 hours before a blood sample is taken.  A random blood glucose test. Your blood glucose is checked at any time of the day regardless of when you ate.  A hemoglobin A1c blood glucose test. A hemoglobin A1c test provides information about blood glucose control over the previous 3 months.  An oral glucose tolerance test (OGTT). Your blood glucose is measured after you have not eaten (fasted) for 2 hours and then after you drink a glucose-containing beverage. TREATMENT   You may need to take insulin or diabetes medicine daily to keep blood glucose levels in the desired range.  You will need to match insulin dosing with exercise and healthy food choices. The treatment goal is to maintain the before meal blood sugar (preprandial glucose) level at 70 130 mg/dL. HOME CARE INSTRUCTIONS   Have your hemoglobin A1c level checked twice a year.  Perform daily blood glucose monitoring as directed by your caregiver.  Monitor urine ketones when you are ill and as directed by your caregiver.  Take your diabetes medicine or insulin as directed by your caregiver to maintain your blood glucose levels in the desired range.  Never run out of diabetes medicine or insulin. It is needed every day.  Adjust insulin based on your intake of carbohydrates. Carbohydrates can raise blood glucose levels but need to be included in your diet. Carbohydrates provide vitamins, minerals, and fiber which are an essential part of   a healthy diet. Carbohydrates are found in fruits, vegetables, whole grains, dairy products, legumes, and foods containing added sugars.    Eat healthy foods. Alternate 3 meals with 3 snacks.  Lose weight if overweight.  Carry a medical alert card or wear your medical alert jewelry.  Carry a 15 gram  carbohydrate snack with you at all times to treat low blood glucose (hypoglycemia). Some examples of 15 gram carbohydrate snacks include:  Glucose tablets, 3 or 4   Glucose gel, 15 gram tube  Raisins, 2 tablespoons (24 grams)  Jelly beans, 6  Animal crackers, 8  Regular pop, 4 ounces (120 mL)  Gummy treats, 9  Recognize hypoglycemia. Hypoglycemia occurs with blood glucose levels of 70 mg/dL and below. The risk for hypoglycemia increases when fasting or skipping meals, during or after intense exercise, and during sleep. Hypoglycemia symptoms can include:  Tremors or shakes.  Decreased ability to concentrate.  Sweating.  Increased heart rate.  Headache.  Dry mouth.  Hunger.  Irritability.  Anxiety.  Restless sleep.  Altered speech or coordination.  Confusion.  Treat hypoglycemia promptly. If you are alert and able to safely swallow, follow the 15:15 rule:  Take 15 20 grams of rapid-acting glucose or carbohydrate. Rapid-acting options include glucose gel, glucose tablets, or 4 ounces (120 mL) of fruit juice, regular soda, or low fat milk.  Check your blood glucose level 15 minutes after taking the glucose.  Take 15 20 grams more of glucose if the repeat blood glucose level is still 70 mg/dL or below.  Eat a meal or snack within 1 hour once blood glucose levels return to normal.    Be alert to polyuria and polydipsia which are early signs of hyperglycemia. An early awareness of hyperglycemia allows for prompt treatment. Treat hyperglycemia as directed by your caregiver.  Engage in at least 150 minutes of moderate-intensity physical activity a week, spread over at least 3 days of the week or as directed by your caregiver. In addition, you should engage in resistance exercise at least 2 times a week or as directed by your caregiver.  Adjust your medicine and food intake as needed if you start a new exercise or sport.  Follow your sick day plan at any time you  are unable to eat or drink as usual.  Avoid tobacco use.  Limit alcohol intake to no more than 1 drink per day for nonpregnant women and 2 drinks per day for men. You should drink alcohol only when you are also eating food. Talk with your caregiver whether alcohol is safe for you. Tell your caregiver if you drink alcohol several times a week.  Follow up with your caregiver regularly.  Schedule an eye exam soon after the diagnosis of type 2 diabetes and then annually.  Perform daily skin and foot care. Examine your skin and feet daily for cuts, bruises, redness, nail problems, bleeding, blisters, or sores. A foot exam by a caregiver should be done annually.  Brush your teeth and gums at least twice a day and floss at least once a day. Follow up with your dentist regularly.  Share your diabetes management plan with your workplace or school.  Stay up-to-date with immunizations.  Learn to manage stress.  Obtain ongoing diabetes education and support as needed.  Participate in, or seek rehabilitation as needed to maintain or improve independence and quality of life. Request a physical or occupational therapy referral if you are having foot or hand numbness or difficulties with grooming,   dressing, eating, or physical activity. SEEK MEDICAL CARE IF:   You are unable to eat food or drink fluids for more than 6 hours.  You have nausea and vomiting for more than 6 hours.  Your blood glucose level is over 240 mg/dL.  There is a change in mental status.  You develop an additional serious illness.  You have diarrhea for more than 6 hours.  You have been sick or have had a fever for a couple of days and are not getting better.  You have pain during any physical activity.  SEEK IMMEDIATE MEDICAL CARE IF:  You have difficulty breathing.  You have moderate to large ketone levels. MAKE SURE YOU:  Understand these instructions.  Will watch your condition.  Will get help right away if  you are not doing well or get worse. Document Released: 11/30/2005 Document Revised: 08/24/2012 Document Reviewed: 06/28/2012 [employer] Patient Information 2014 [employer].  

## 2013-09-03 ENCOUNTER — Encounter: Payer: Self-pay | Admitting: Internal Medicine

## 2013-09-03 NOTE — Assessment & Plan Note (Signed)
Her BMP shows no evidence of acidosis She will stop metformin due to the diarrhea She will continue on with invokana and tradjenta

## 2013-09-03 NOTE — Assessment & Plan Note (Signed)
Her BP is well controlled 

## 2013-09-04 ENCOUNTER — Telehealth: Payer: Self-pay | Admitting: Internal Medicine

## 2013-09-04 NOTE — Telephone Encounter (Signed)
Patient Information:  Caller Name: Nkechi  Phone: [phone]  Patient: Valerie [name]  Gender: Female  DOB: 02-11-1946  Age: 67 Years  PCP: Sanda Linger (Adults only)  Office Follow Up:  Does the office need to follow up with this patient?: Yes  Instructions For The Office: Pls see RN note  RN Note:  Sweating, Shaking,  Fatigue, feels like she'll faint, recently changed DM meds. Pt started Invokana on 8-4, stopped Metformin approx 1 week ago and started [place] on 9-19.  Pt states she felt as though she would pass out on 9-21 w/ swelling in lips.  Pt denies dizziness, shaking, sweating, swelling in lips, chest tightest or swallowing issues at time of call. Pt is laying down.  Current Finger Stick is 181. Pt doesn't want to take Invokana or Tradjenta. Pt had Toe surgery on 9-22, Pt is suppose to stay off her feet today.  Appt offered, no same day appts remaining at [facility], unless w/ Dr Katrinka Blazing, uncertain if he sees sick Pt's.  Please review w/ Dr Yetta Barre and f/u w/ Pt if MD will fit her in for an appt or call back to discuss DM meds Invokana and Trajenta, Pt does not want to take d/t her sxs, feels medication is causing.  Symptoms  Reason For Call & Symptoms: Sweating, Shaking,  Fatigue, feels like she'll faint, recently changed DM meds.  Reviewed Health History In EMR: Yes  Reviewed Medications In EMR: Yes  Reviewed Allergies In EMR: Yes  Reviewed Surgeries / Procedures: Yes  Date of Onset of Symptoms: 08/28/2013  Guideline(s) Used:  Diabetes - High Blood Sugar  Disposition Per Guideline:   Call Transferred to PCP Now  Reason For Disposition Reached:   Caller has URGENT medication or insulin pump question and triager unable to answer question  Advice Given:  N/A  Patient Will Follow Care Advice:  YES

## 2013-09-13 ENCOUNTER — Ambulatory Visit (INDEPENDENT_AMBULATORY_CARE_PROVIDER_SITE_OTHER): Payer: Medicare Other | Admitting: Endocrinology

## 2013-09-13 ENCOUNTER — Encounter: Payer: Self-pay | Admitting: Endocrinology

## 2013-09-13 VITALS — BP 120/70 | HR 84 | Wt 264.0 lb

## 2013-09-13 DIAGNOSIS — E1129 Type 2 diabetes mellitus with other diabetic kidney complication: Secondary | ICD-10-CM

## 2013-09-13 MED ORDER — NATEGLINIDE 120 MG PO TABS: 120.0000 mg | ORAL_TABLET | Freq: Three times a day (TID) | ORAL | Status: DC

## 2013-09-13 NOTE — Patient Instructions (Addendum)
good diet and exercise habits significanly improve the control of your diabetes.  please let me know if you wish to be referred to a dietician.  high blood sugar is very risky to your health.  you should see an eye doctor every year.  You are at higher than average risk for pneumonia and hepatitis-B.  You should be vaccinated against both.   controlling your blood pressure and cholesterol drastically reduces the damage diabetes does to your body.  this also applies to quitting smoking.  please discuss these with your doctor.  check your blood sugar once a day.  vary the time of day when you check, between before the 3 meals, and at bedtime.  also check if you have symptoms of your blood sugar being too high or too low.  please keep a record of the readings and bring it to your next appointment here.  please call us sooner if your blood sugar goes below 70, or if you have a lot of readings over 200.  Please stop the invokana and tradjenta for now.  i have sent a prescription to your pharmacy, to take "nateglinide."  Call in 1-2 weeks if your blood sugar is high, so we can add a small amount of metformin.  Also, we can re-try the actos if necessary.

## 2013-09-13 NOTE — Progress Notes (Signed)
Subjective:    Patient ID: Valerie [name], female    DOB: [birth year], 67 y.o.   MRN: [id number]  HPI pt states DM was dx'ed in 2007; he has moderate neuropathy of the lower extremities; she has associated renal dz.  she has never been on insulin.  pt says his diet and exercise are not good (activity is limited by health probs).  She says cbg's are often over 200, even before she stopped tradjenta and invokana last week, due to lip swelling and fatigue.  She says she will consider re-trying these meds, but not now.  Also, she wants only generic rx now. Past Medical History  Diagnosis Date  . Anemia   . Depression   . Diabetes mellitus, type 2   . GERD (gastroesophageal reflux disease)   . Hyperlipidemia   . Hypertension   . Sleep apnea   . Asthma   . Hemorrhoids   . Fatigue   . Snoring disorder     Past Surgical History  Procedure Laterality Date  . Benign tumors resected    . Tubal ligation    . Rotator cuff repair    . Cataract extraction Left   . Refractive surgery      Glaucoma    History   Social History  . Marital Status: Married    Spouse Name: N/A    Number of Children: N/A  . Years of Education: N/A   Occupational History  . disabled    Social History Main Topics  . Smoking status: Never Smoker   . Smokeless tobacco: Never Used  . Alcohol Use: No  . Drug Use: No  . Sexual Activity: Not Currently   Other Topics Concern  . Not on file   Social History Narrative  . No narrative on file    Current Outpatient Prescriptions on File Prior to Visit  Medication Sig Dispense Refill  . albuterol (VENTOLIN HFA) 108 (90 BASE) MCG/ACT inhaler Inhale 2 puffs into the lungs every 6 (six) hours as needed. For shortness of breath  3 Inhaler  4  . alprazolam (XANAX) 2 MG tablet Take 1 tablet (2 mg total) by mouth 3 (three) times daily as needed. For anxiety  65 tablet  2  . amLODipine (NORVASC) 10 MG tablet Take 10 mg by mouth daily.       . Canagliflozin (INVOKANA)  300 MG TABS Take 1 tablet by mouth daily.  90 tablet  3  . carvedilol (COREG) 25 MG tablet TAKE ONE TABLET BY MOUTH TWICE DAILY WITH MEALS  180 tablet  3  . erythromycin base (E-MYCIN) 500 MG tablet Take 500 mg by mouth daily.      . ferrous sulfate 325 (65 FE) MG tablet Take 325 mg by mouth daily with breakfast.      . FREESTYLE LITE test strip USE TO CHECK BLOOD SUGAR DAILY  100 each  11  . hydrOXYzine (ATARAX/VISTARIL) 10 MG tablet       . latanoprost (XALATAN) 0.005 % ophthalmic solution Place 1 drop into both eyes at bedtime.      [name] linagliptin (TRADJENTA) 5 MG TABS tablet Take 1 tablet (5 mg total) by mouth daily.  90 tablet  1  . Mometasone Furo-Formoterol Fum 200-5 MCG/ACT AERO Inhale 2 Act into the lungs 2 (two) times daily.  3 Inhaler  0  . montelukast (SINGULAIR) 10 MG tablet Take 10 mg by mouth at bedtime.        [name] olmesartan-hydrochlorothiazide (BENICAR HCT)  20-12.5 MG per tablet Take 1 tablet by mouth daily.  30 tablet  1  . omeprazole (PRILOSEC) 40 MG capsule Take 1 capsule (40 mg total) by mouth daily.  90 capsule  3  . oxyCODONE-acetaminophen (PERCOCET) 5-325 MG per tablet Take 1 tablet by mouth every 4 (four) hours as needed for pain.  75 tablet  0  . rosuvastatin (CRESTOR) 10 MG tablet Take 10 mg by mouth daily.         No current facility-administered medications on file prior to visit.    Allergies  Allergen Reactions  . Metformin And Related     diarrhea  . Food Swelling    bananas  . Penicillins Swelling and Rash    Family History  Problem Relation Age of Onset  . Hypertension      family history  . Alcohol abuse    . Alcohol abuse Mother   . Heart attack Mother   . Coronary artery disease Brother   . Heart attack Father   . Heart disease Sister   . Atrial fibrillation Sister   . Hypertension Sister   DM: none  BP 120/70  Pulse 84  Wt 264 lb (119.75 kg)  BMI 43.93 kg/m2  SpO2 97%  Review of Systems denies weight loss, headache, chest pain, sob,  n/v, cramps, memory loss, menopausal sxs, rhinorrhea, and easy bruising.  She has blurry vision (saw opthal for this).  She has urinary frequency, depression, and excessive diaphoresis).      Objective:   Physical Exam VS: see vs page GEN: no distress HEAD: head: no deformity eyes: no periorbital swelling, no proptosis external nose and ears are normal mouth: no lesion seen NECK: supple, thyroid is not enlarged CHEST WALL: no deformity LUNGS:  Clear to auscultation CV: reg rate and rhythm, no murmur ABD: abdomen is soft, nontender.  no hepatosplenomegaly.  not distended.  no hernia MUSCULOSKELETAL: muscle bulk and strength are grossly normal.  no obvious joint swelling.  gait is normal and steady PULSES: no carotid bruit NEURO:  cn 2-12 grossly intact.   readily moves all 4's.   SKIN:  Normal texture and temperature.  No rash or suspicious lesion is visible.   NODES:  None palpable at the neck PSYCH: alert, oriented x3.  Does not appear anxious nor depressed.  Lab Results  Component Value Date   HGBA1C 7.8* 07/17/2013      Assessment & Plan:  DM: We discussed the nine oral agents available for type 2 diabetes.  This regimen gives the best risk-benefit ratio.  However, she likely will need at least 2 oral meds, if not 3. Obesity: this complicates the rx of DM Lip swelling: unlikely related to the 2 DM meds.

## 2013-10-04 ENCOUNTER — Encounter: Payer: Self-pay | Admitting: Neurology

## 2013-10-04 ENCOUNTER — Encounter (INDEPENDENT_AMBULATORY_CARE_PROVIDER_SITE_OTHER): Payer: Self-pay

## 2013-10-04 ENCOUNTER — Ambulatory Visit (INDEPENDENT_AMBULATORY_CARE_PROVIDER_SITE_OTHER): Payer: Medicare Other | Admitting: Neurology

## 2013-10-04 VITALS — BP 133/79 | HR 82 | Wt 267.0 lb

## 2013-10-04 DIAGNOSIS — R209 Unspecified disturbances of skin sensation: Secondary | ICD-10-CM | POA: Insufficient documentation

## 2013-10-04 DIAGNOSIS — R413 Other amnesia: Secondary | ICD-10-CM

## 2013-10-04 HISTORY — DX: Other amnesia: R41.3

## 2013-10-04 NOTE — Progress Notes (Signed)
Reason for visit: Memory disturbance  Valerie [name] is an 67 y.o. female  History of present illness:  Valerie [name] is a 67 year old right-handed black female with a history of obesity, and obstructive sleep apnea on CPAP. The patient has report some excessive daytime drowsiness and some problems with memory and concentration. The patient has had no improvement of her memory, but she indicates that she has become intolerant of the CPAP mask, and she has not been able to use her CPAP machine for about one month. The patient has recently had left foot surgery. MRI done recently showed some minimal small vessel disease. The patient reports that in March of 2014, she had sudden onset of left-sided sensory changes with numbness, and she is also had some discomfort in the same distribution. The patient went to the emergency room, and MRI evaluation of the brain did not show an acute stroke. The patient is not aspirin or Plavix. The patient has some burning sensations of the left side of the face at times. The patient comes to this office for an evaluation.  Past Medical History  Diagnosis Date  . Anemia   . Depression   . Diabetes mellitus, type 2   . GERD (gastroesophageal reflux disease)   . Hyperlipidemia   . Hypertension   . Sleep apnea   . Asthma   . Hemorrhoids   . Fatigue   . Snoring disorder   . Memory deficit 10/04/2013  . Obesity   . Anxiety and depression   . Glaucoma   . Degenerative arthritis   . Hypothyroid     Past Surgical History  Procedure Laterality Date  . Benign tumors resected    . Tubal ligation    . Rotator cuff repair    . Cataract extraction Left   . Refractive surgery      Glaucoma    Family History  Problem Relation Age of Onset  . Hypertension      family history  . Alcohol abuse    . Alcohol abuse Mother   . Heart attack Mother   . Coronary artery disease Brother   . Heart attack Father   . Heart disease Sister   . Atrial fibrillation Sister    . Hypertension Sister     Social history:  reports that she has never smoked. She has never used smokeless tobacco. She reports that she does not drink alcohol or use illicit drugs.    Allergies  Allergen Reactions  . Metformin And Related     diarrhea  . Food Swelling    bananas  . Penicillins Swelling and Rash    Medications:  Current Outpatient Prescriptions on File Prior to Visit  Medication Sig Dispense Refill  . albuterol (VENTOLIN HFA) 108 (90 BASE) MCG/ACT inhaler Inhale 2 puffs into the lungs every 6 (six) hours as needed. For shortness of breath  3 Inhaler  4  . alprazolam (XANAX) 2 MG tablet Take 1 tablet (2 mg total) by mouth 3 (three) times daily as needed. For anxiety  65 tablet  2  . amLODipine (NORVASC) 10 MG tablet Take 10 mg by mouth daily.       . carvedilol (COREG) 25 MG tablet TAKE ONE TABLET BY MOUTH TWICE DAILY WITH MEALS  180 tablet  3  . FREESTYLE LITE test strip USE TO CHECK BLOOD SUGAR DAILY  100 each  11  . latanoprost (XALATAN) 0.005 % ophthalmic solution Place 1 drop into both eyes at bedtime.      [name]  montelukast (SINGULAIR) 10 MG tablet Take 10 mg by mouth at bedtime.        . nateglinide (STARLIX) 120 MG tablet Take 1 tablet (120 mg total) by mouth 3 (three) times daily before meals.  90 tablet  11  . olmesartan-hydrochlorothiazide (BENICAR HCT) 20-12.5 MG per tablet Take 1 tablet by mouth daily.  30 tablet  1  . omeprazole (PRILOSEC) 40 MG capsule Take 1 capsule (40 mg total) by mouth daily.  90 capsule  3  . oxyCODONE-acetaminophen (PERCOCET) 5-325 MG per tablet Take 1 tablet by mouth every 4 (four) hours as needed for pain.  75 tablet  0  . rosuvastatin (CRESTOR) 10 MG tablet Take 10 mg by mouth daily.         No current facility-administered medications on file prior to visit.    ROS:  Out of a complete 14 system review of symptoms, the patient complains only of the following symptoms, and all other reviewed systems are  negative.  Fatigue Feeling hot Numbness Memory disturbance  Blood pressure 133/79, pulse 82, weight 267 lb (121.11 kg).  Physical Exam  General: The patient is alert and cooperative at the time of the examination. The patient is moderately obese.  Skin: No significant peripheral edema is noted.   Neurologic Exam  Mental status: The patient is oriented x 3.  Cranial nerves: Facial symmetry is present. Speech is normal, no aphasia or dysarthria is noted. Extraocular movements are full. Visual fields are full.  Motor: The patient has good strength in all 4 extremities.  Sensory examination: Soft touch sensation is symmetric on the face, arms, and legs, with exception that there is some decrease in sensation on the left arm.  Coordination: The patient has good finger-nose-finger and heel-to-shin bilaterally.  Gait and station: The patient has a normal gait. Tandem gait is slightly unsteady.. Romberg is negative. No drift is seen.  Reflexes: Deep tendon reflexes are symmetric, but are depressed.   Assessment/Plan:  1. Memory disturbance  2. Obstructive sleep apnea  3. Left hemisensory deficit  The patient has undergone MRI evaluation that did not show evidence of an acute stroke. The patient will undergo a carotid Doppler study to evaluate the left hemisensory deficit, and she will go on low-dose aspirin. The memory issue will need to be followed over time, and the patient will followup in about 6 months. If progression of memory is noted while on CPAP, medications for memory may be added.  Marlan Palau MD 10/04/2013 7:37 PM  [facility] [address] [place], [place] [address]  Phone [phone] Fax [phone]

## 2013-10-06 ENCOUNTER — Ambulatory Visit: Payer: Medicare Other | Admitting: Endocrinology

## 2013-10-17 ENCOUNTER — Ambulatory Visit (INDEPENDENT_AMBULATORY_CARE_PROVIDER_SITE_OTHER): Payer: Medicare Other

## 2013-10-17 DIAGNOSIS — R209 Unspecified disturbances of skin sensation: Secondary | ICD-10-CM

## 2013-10-17 DIAGNOSIS — R413 Other amnesia: Secondary | ICD-10-CM

## 2013-10-20 ENCOUNTER — Telehealth: Payer: Self-pay | Admitting: Neurology

## 2013-10-20 NOTE — Telephone Encounter (Signed)
I called patient. The carotid Doppler study was unremarkable. 

## 2013-10-26 ENCOUNTER — Ambulatory Visit (INDEPENDENT_AMBULATORY_CARE_PROVIDER_SITE_OTHER): Payer: Medicare Other | Admitting: Internal Medicine

## 2013-10-26 ENCOUNTER — Other Ambulatory Visit (INDEPENDENT_AMBULATORY_CARE_PROVIDER_SITE_OTHER): Payer: Medicare Other

## 2013-10-26 ENCOUNTER — Encounter: Payer: Self-pay | Admitting: Internal Medicine

## 2013-10-26 VITALS — BP 120/83 | HR 81 | Temp 97.2°F | Resp 16 | Ht 65.0 in | Wt 266.5 lb

## 2013-10-26 DIAGNOSIS — R209 Unspecified disturbances of skin sensation: Secondary | ICD-10-CM

## 2013-10-26 DIAGNOSIS — I10 Essential (primary) hypertension: Secondary | ICD-10-CM

## 2013-10-26 DIAGNOSIS — E1129 Type 2 diabetes mellitus with other diabetic kidney complication: Secondary | ICD-10-CM

## 2013-10-26 DIAGNOSIS — E039 Hypothyroidism, unspecified: Secondary | ICD-10-CM

## 2013-10-26 DIAGNOSIS — M48061 Spinal stenosis, lumbar region without neurogenic claudication: Secondary | ICD-10-CM

## 2013-10-26 LAB — BASIC METABOLIC PANEL
BUN: 14 mg/dL (ref 6–23)
CO2: 27 mEq/L (ref 19–32)
Calcium: 9.5 mg/dL (ref 8.4–10.5)
Chloride: 101 mEq/L (ref 96–112)
Creatinine, Ser: 0.7 mg/dL (ref 0.4–1.2)
GFR: 115.36 mL/min (ref >60.00)
Glucose, Bld: 96 mg/dL (ref 70–99)
Potassium: 3.6 mEq/L (ref 3.5–5.1)
Sodium: 138 mEq/L (ref 135–145)

## 2013-10-26 LAB — TSH: TSH: 1.51 u[IU]/mL (ref 0.35–5.50)

## 2013-10-26 LAB — HEMOGLOBIN A1C: Hgb A1c MFr Bld: 7 % — ABNORMAL HIGH (ref 4.6–6.5)

## 2013-10-26 MED ORDER — SITAGLIPTIN PHOSPHATE 100 MG PO TABS: 100.0000 mg | ORAL_TABLET | Freq: Every day | ORAL | Status: DC

## 2013-10-26 MED ORDER — OXYCODONE-ACETAMINOPHEN 5-325 MG PO TABS: 1.0000 | ORAL_TABLET | ORAL | Status: DC | PRN

## 2013-10-26 MED ORDER — GABAPENTIN 300 MG PO CAPS: 300.0000 mg | ORAL_CAPSULE | Freq: Three times a day (TID) | ORAL | Status: DC

## 2013-10-26 NOTE — Assessment & Plan Note (Signed)
She is getting relief from the pain with percocet

## 2013-10-26 NOTE — Assessment & Plan Note (Signed)
She is euthyroid.

## 2013-10-26 NOTE — Progress Notes (Signed)
Pre visit review using our clinic review tool, if applicable. No additional management support is needed unless otherwise documented below in the visit note. 

## 2013-10-26 NOTE — Assessment & Plan Note (Addendum)
She has good control of her blood sugars She will continue neurontin for the neuropathy

## 2013-10-26 NOTE — Patient Instructions (Signed)
Type 2 Diabetes Mellitus, Adult Type 2 diabetes mellitus, often simply referred to as type 2 diabetes, is a long-lasting (chronic) disease. In type 2 diabetes, the pancreas does not make enough insulin (a hormone), the cells are less responsive to the insulin that is made (insulin resistance), or both. Normally, insulin moves sugars from food into the tissue cells. The tissue cells use the sugars for energy. The lack of insulin or the lack of normal response to insulin causes excess sugars to build up in the blood instead of going into the tissue cells. As a result, high blood sugar (hyperglycemia) develops. The effect of high sugar (glucose) levels can cause many complications. Type 2 diabetes was also previously called adult-onset diabetes but it can occur at any age.  RISK FACTORS  A person is predisposed to developing type 2 diabetes if someone in the family has the disease and also has one or more of the following primary risk factors:  Overweight.  An inactive lifestyle.  A history of consistently eating high-calorie foods. Maintaining a normal weight and regular physical activity can reduce the chance of developing type 2 diabetes. SYMPTOMS  A person with type 2 diabetes may not show symptoms initially. The symptoms of type 2 diabetes appear slowly. The symptoms include:  Increased thirst (polydipsia).  Increased urination (polyuria).  Increased urination during the night (nocturia).  Weight loss. This weight loss may be rapid.  Frequent, recurring infections.  Tiredness (fatigue).  Weakness.  Vision changes, such as blurred vision.  Fruity smell to your breath.  Abdominal pain.  Nausea or vomiting.  Cuts or bruises which are slow to heal.  Tingling or numbness in the hands or feet. DIAGNOSIS Type 2 diabetes is frequently not diagnosed until complications of diabetes are present. Type 2 diabetes is diagnosed when symptoms or complications are present and when blood  glucose levels are increased. Your blood glucose level may be checked by one or more of the following blood tests:  A fasting blood glucose test. You will not be allowed to eat for at least 8 hours before a blood sample is taken.  A random blood glucose test. Your blood glucose is checked at any time of the day regardless of when you ate.  A hemoglobin A1c blood glucose test. A hemoglobin A1c test provides information about blood glucose control over the previous 3 months.  An oral glucose tolerance test (OGTT). Your blood glucose is measured after you have not eaten (fasted) for 2 hours and then after you drink a glucose-containing beverage. TREATMENT   You may need to take insulin or diabetes medicine daily to keep blood glucose levels in the desired range.  You will need to match insulin dosing with exercise and healthy food choices. The treatment goal is to maintain the before meal blood sugar (preprandial glucose) level at 70 130 mg/dL. HOME CARE INSTRUCTIONS   Have your hemoglobin A1c level checked twice a year.  Perform daily blood glucose monitoring as directed by your caregiver.  Monitor urine ketones when you are ill and as directed by your caregiver.  Take your diabetes medicine or insulin as directed by your caregiver to maintain your blood glucose levels in the desired range.  Never run out of diabetes medicine or insulin. It is needed every day.  Adjust insulin based on your intake of carbohydrates. Carbohydrates can raise blood glucose levels but need to be included in your diet. Carbohydrates provide vitamins, minerals, and fiber which are an essential part of   a healthy diet. Carbohydrates are found in fruits, vegetables, whole grains, dairy products, legumes, and foods containing added sugars.    Eat healthy foods. Alternate 3 meals with 3 snacks.  Lose weight if overweight.  Carry a medical alert card or wear your medical alert jewelry.  Carry a 15 gram  carbohydrate snack with you at all times to treat low blood glucose (hypoglycemia). Some examples of 15 gram carbohydrate snacks include:  Glucose tablets, 3 or 4   Glucose gel, 15 gram tube  Raisins, 2 tablespoons (24 grams)  Jelly beans, 6  Animal crackers, 8  Regular pop, 4 ounces (120 mL)  Gummy treats, 9  Recognize hypoglycemia. Hypoglycemia occurs with blood glucose levels of 70 mg/dL and below. The risk for hypoglycemia increases when fasting or skipping meals, during or after intense exercise, and during sleep. Hypoglycemia symptoms can include:  Tremors or shakes.  Decreased ability to concentrate.  Sweating.  Increased heart rate.  Headache.  Dry mouth.  Hunger.  Irritability.  Anxiety.  Restless sleep.  Altered speech or coordination.  Confusion.  Treat hypoglycemia promptly. If you are alert and able to safely swallow, follow the 15:15 rule:  Take 15 20 grams of rapid-acting glucose or carbohydrate. Rapid-acting options include glucose gel, glucose tablets, or 4 ounces (120 mL) of fruit juice, regular soda, or low fat milk.  Check your blood glucose level 15 minutes after taking the glucose.  Take 15 20 grams more of glucose if the repeat blood glucose level is still 70 mg/dL or below.  Eat a meal or snack within 1 hour once blood glucose levels return to normal.    Be alert to polyuria and polydipsia which are early signs of hyperglycemia. An early awareness of hyperglycemia allows for prompt treatment. Treat hyperglycemia as directed by your caregiver.  Engage in at least 150 minutes of moderate-intensity physical activity a week, spread over at least 3 days of the week or as directed by your caregiver. In addition, you should engage in resistance exercise at least 2 times a week or as directed by your caregiver.  Adjust your medicine and food intake as needed if you start a new exercise or sport.  Follow your sick day plan at any time you  are unable to eat or drink as usual.  Avoid tobacco use.  Limit alcohol intake to no more than 1 drink per day for nonpregnant women and 2 drinks per day for men. You should drink alcohol only when you are also eating food. Talk with your caregiver whether alcohol is safe for you. Tell your caregiver if you drink alcohol several times a week.  Follow up with your caregiver regularly.  Schedule an eye exam soon after the diagnosis of type 2 diabetes and then annually.  Perform daily skin and foot care. Examine your skin and feet daily for cuts, bruises, redness, nail problems, bleeding, blisters, or sores. A foot exam by a caregiver should be done annually.  Brush your teeth and gums at least twice a day and floss at least once a day. Follow up with your dentist regularly.  Share your diabetes management plan with your workplace or school.  Stay up-to-date with immunizations.  Learn to manage stress.  Obtain ongoing diabetes education and support as needed.  Participate in, or seek rehabilitation as needed to maintain or improve independence and quality of life. Request a physical or occupational therapy referral if you are having foot or hand numbness or difficulties with grooming,   dressing, eating, or physical activity. SEEK MEDICAL CARE IF:   You are unable to eat food or drink fluids for more than 6 hours.  You have nausea and vomiting for more than 6 hours.  Your blood glucose level is over 240 mg/dL.  There is a change in mental status.  You develop an additional serious illness.  You have diarrhea for more than 6 hours.  You have been sick or have had a fever for a couple of days and are not getting better.  You have pain during any physical activity.  SEEK IMMEDIATE MEDICAL CARE IF:  You have difficulty breathing.  You have moderate to large ketone levels. MAKE SURE YOU:  Understand these instructions.  Will watch your condition.  Will get help right away if  you are not doing well or get worse. Document Released: 11/30/2005 Document Revised: 08/24/2012 Document Reviewed: 06/28/2012 [employer] Patient Information 2014 [employer].  

## 2013-10-26 NOTE — Progress Notes (Signed)
Subjective:    Patient ID: Valerie [name], female    DOB: 15-Nov-1946, 67 y.o.   MRN: [id number]  Diabetes She presents for her follow-up diabetic visit. She has type 2 diabetes mellitus. Her disease course has been stable. There are no hypoglycemic associated symptoms. Pertinent negatives for hypoglycemia include no dizziness, headaches or speech difficulty. Associated symptoms include foot paresthesias. Pertinent negatives for diabetes include no blurred vision, no chest pain, no fatigue, no foot ulcerations, no polydipsia, no polyphagia, no polyuria, no visual change, no weakness and no weight loss. There are no hypoglycemic complications. Diabetic complications include peripheral neuropathy. She is compliant with treatment most of the time. Her weight is stable. She is following a generally healthy diet. She has not had a previous visit with a dietician. She participates in exercise intermittently. There is no change in her home blood glucose trend. She does not see a podiatrist.Eye exam is current.      Review of Systems  Constitutional: Negative.  Negative for fever, chills, weight loss, diaphoresis, activity change, appetite change, fatigue and unexpected weight change.  HENT: Negative.   Eyes: Negative.  Negative for blurred vision.  Respiratory: Negative.  Negative for apnea, cough, choking, chest tightness, shortness of breath, wheezing and stridor.   Cardiovascular: Negative.  Negative for chest pain, palpitations and leg swelling.  Gastrointestinal: Negative.  Negative for nausea, vomiting, abdominal pain, diarrhea, constipation, blood in stool and rectal pain.  Endocrine: Negative.  Negative for polydipsia, polyphagia and polyuria.  Genitourinary: Negative.   Musculoskeletal: Positive for back pain. Negative for arthralgias, gait problem, joint swelling, myalgias, neck pain and neck stiffness.  Skin: Negative.   Allergic/Immunologic: Negative.   Neurological: Positive for numbness.  Negative for dizziness, speech difficulty, weakness and headaches.       She has had numbness and tingling in her extremities and she took her husband's neurontin and she tells me that has helped her quite a bit.  Hematological: Negative.  Negative for adenopathy. Does not bruise/bleed easily.  Psychiatric/Behavioral: Negative.        Objective:   Physical Exam  Vitals reviewed. Constitutional: She is oriented to person, place, and time. She appears well-developed and well-nourished. No distress.  HENT:  Head: Normocephalic and atraumatic.  Mouth/Throat: Oropharynx is clear and moist. No oropharyngeal exudate.  Eyes: Conjunctivae are normal. Right eye exhibits no discharge. Left eye exhibits no discharge. No scleral icterus.  Neck: Normal range of motion. Neck supple. No JVD present. No tracheal deviation present. No thyromegaly present.  Cardiovascular: Normal rate, regular rhythm, normal heart sounds and intact distal pulses.  Exam reveals no gallop and no friction rub.   No murmur heard. Pulmonary/Chest: Effort normal and breath sounds normal. No stridor. No respiratory distress. She has no wheezes. She has no rales. She exhibits no tenderness.  Abdominal: Soft. Bowel sounds are normal. She exhibits no distension and no mass. There is no tenderness. There is no rebound and no guarding.  Musculoskeletal: Normal range of motion. She exhibits no edema and no tenderness.  Lymphadenopathy:    She has no cervical adenopathy.  Neurological: She is alert and oriented to person, place, and time. She has normal reflexes. She displays normal reflexes. No cranial nerve deficit. She exhibits normal muscle tone. Coordination normal.  Skin: Skin is warm and dry. No rash noted. She is not diaphoretic. No erythema. No pallor.  Psychiatric: She has a normal mood and affect. Her behavior is normal. Judgment and thought content normal.  Lab Results  Component Value Date   WBC 11.4* 07/17/2013   HGB  11.9* 07/17/2013   HCT 36.3 07/17/2013   PLT 319.0 07/17/2013   GLUCOSE 121* 09/01/2013   CHOL 142 01/20/2013   TRIG 211.0* 01/20/2013   HDL 43.40 01/20/2013   LDLDIRECT 61.3 01/20/2013   ALT 32 07/17/2013   AST 32 07/17/2013   NA 139 09/01/2013   K 3.7 09/01/2013   CL 101 09/01/2013   CREATININE 0.8 09/01/2013   BUN 15 09/01/2013   CO2 28 09/01/2013   TSH 2.25 01/20/2013   INR 0.97 07/12/2010   HGBA1C 7.8* 07/17/2013   MICROALBUR 3.2* 10/17/2010       Assessment & Plan:

## 2013-10-27 ENCOUNTER — Telehealth: Payer: Self-pay | Admitting: Internal Medicine

## 2013-10-27 MED ORDER — AMLODIPINE BESYLATE 10 MG PO TABS: 10.0000 mg | ORAL_TABLET | Freq: Every day | ORAL | Status: DC

## 2013-10-27 MED ORDER — OMEPRAZOLE 40 MG PO CPDR: 40.0000 mg | DELAYED_RELEASE_CAPSULE | Freq: Every day | ORAL | Status: DC

## 2013-10-27 NOTE — Telephone Encounter (Signed)
yes

## 2013-10-27 NOTE — Telephone Encounter (Signed)
Patient called in requesting a refill on  amLODipine (NORVASC) 10 MG tablet and omeprazole (PRILOSEC) 40 MG capsule Please advise if this can be done

## 2013-11-15 ENCOUNTER — Ambulatory Visit (INDEPENDENT_AMBULATORY_CARE_PROVIDER_SITE_OTHER): Payer: Medicare Other

## 2013-11-15 DIAGNOSIS — G4733 Obstructive sleep apnea (adult) (pediatric): Secondary | ICD-10-CM

## 2013-11-15 DIAGNOSIS — R0683 Snoring: Secondary | ICD-10-CM

## 2013-11-15 DIAGNOSIS — R0902 Hypoxemia: Secondary | ICD-10-CM

## 2013-11-15 DIAGNOSIS — E663 Overweight: Secondary | ICD-10-CM

## 2013-11-24 ENCOUNTER — Encounter (INDEPENDENT_AMBULATORY_CARE_PROVIDER_SITE_OTHER): Payer: Self-pay

## 2013-11-24 ENCOUNTER — Encounter: Payer: Self-pay | Admitting: Neurology

## 2013-11-24 ENCOUNTER — Ambulatory Visit (INDEPENDENT_AMBULATORY_CARE_PROVIDER_SITE_OTHER): Payer: Medicare Other | Admitting: Neurology

## 2013-11-24 VITALS — BP 133/62 | HR 87 | Resp 18 | Ht 66.75 in | Wt 276.0 lb

## 2013-11-24 DIAGNOSIS — E662 Morbid (severe) obesity with alveolar hypoventilation: Secondary | ICD-10-CM

## 2013-11-24 DIAGNOSIS — G4733 Obstructive sleep apnea (adult) (pediatric): Secondary | ICD-10-CM

## 2013-11-24 DIAGNOSIS — R0902 Hypoxemia: Secondary | ICD-10-CM | POA: Insufficient documentation

## 2013-11-24 HISTORY — DX: Hypoxemia: R09.02

## 2013-11-24 NOTE — Progress Notes (Signed)
[facility]  Provider:  Dr Alvin Rubano Referring Provider: Janith Lima, MD Primary Care Physician:  [name], MD  Chief Complaint  Patient presents with  . annual visit    HPI:  Valerie [name] is a 67 y.o. female here as a referral from Dr. Ronnald Ramp / Dr Jannifer Franklin.  Interval history Valerie [name] underwent BiPAP - titration on 11-15-13 upon request of Dr. Jannifer Franklin.  We suspected that the patient may suffer from obesity hypoventilation . She brought her previous use BiPAP to the sleep laboratory, which was found to be set at 17 of 11 cm water at the time the patient on this machine it had been reset to 13/8 cm water after the study in 2013.  Her current  BiPAP machine is non functional, and is too old to have downloadable capacity, and also has begun to make noises and is unreliable at night seemingly switching itself off. This makes the order for a new machine essential .  The patient BMI was 44.4. Her  fatigue scale was 63 points,  the PRQR  inventory was not endorsed , the Epworth Sleepiness Scale was 2  points. Next conference measured 70.5 inches. The optimal pressure after this titration seemed to be 13/9 cm. There was still mild snoring altered on 12/8 cm water which lead to further increase in a small increment. The patient slept 89 minutes at this pressure and reached 51 minutes in rem sleep. Her oxygen nadir increased to 94%. The night however and it shortly at 3 hours 20 minutes in AM - the patient could not re-initiate  sleep throughout the night.  Fragmentation of sleep had significantly improved during titration prior to that point. REM rebound was noted.  We will order a new BiPAP machine for the patient;  to be set at 13 cm water over 9 cm and a new  fullface mask ( if she still desired to uses this model in medium-size). He did humidity as ordered.  The backup rate is not  Needed, compliance will be defined as 4 hours or more of nightly  use,  the BiPAP machine is to be brought to all sleep  clinic appointments.   Valerie [name] is an 67 y.o. Female diabled patient , since 2004 no longer gainfully employed. 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   History of present illness:  Valerie [name] is a 67 year old right-handed black female with a history of obesity, and obstructive sleep apnea on CPAP. The patient has report some excessive daytime drowsiness and some problems with memory and concentration. The patient has had no improvement of her memory, but she indicates that she has become intolerant of the CPAP mask, and she has not been able to use her CPAP machine for about one month. The patient has recently had left foot surgery. MRI done recently showed some minimal small vessel disease. The patient reports that in March of 2014, she had sudden onset of left-sided sensory changes with numbness, and she is also had some discomfort in the same distribution. The patient went to the emergency room, and MRI evaluation of the brain did not show an acute stroke. The patient is not aspirin or Plavix. The patient has some burning sensations of the left side of the face at times.  The patient comes to this office for an evaluation. Sleep Consult note : Valerie [name] is a mean of 67 year old African American right-handed lady patient of Dr. Lanny Hurst  Willis and Dr. Regis Bill,  [name]  The patient has a past medical history of morbid obesity and sleep apnea for which Dr. Jannifer Franklin referred her. A sleep  evaluation  With this  patient , who had at the time been on  ongoing therapy , on a BiPAP machine.  Therapy  had not been reevaluated in many years. She reported increasing fatigue and excessive daytime sleepiness . Dr. Jannifer Franklin had evaluated her memory complaints and found small vessel disease to a mild extent on a CAT scan.  The patient was supposed to see Dr. Jannifer Franklin in February  2014. I have seen Valerie [name] in that MD December 2013, for a sleep  consult  proceeding the  study . The PSG revealed a very high AHI of 76.1 she owns a CPAP that was set at 17/11 cm water for a sleep study also short severe oxygen desaturations at night. At worst is today points on a, so she is no longer excessively daytime sleepy but if her fatigue severity is and/or step 63 points, the maximum. The patient is titration showed that she had at the very best response to 13 cm water over 8 cm in a BiPAP function the patient had failed a nasal mask before , thus  preferred to the full face mask. At pulse oximetry done on BiPAP revealed a dry hypoxemia is not corrected. The patient is here for a compliance visit. The patient's revisit was scheduled for December 2014.  The patient reports that her husband complains of her  ongoing snoring while on BiPAP.  [name]  Review of Systems: Out of a complete 14 system review, the patient complains of only the following symptoms, and all other reviewed systems are negative.  She endorsed today not using her buccal night up on a recreational drugs, and on the apnea was circled in the intake sheet. The patient recently had been diagnosed with bone spurs of the foot and had a surgery on the left bunion. No change in family medical history is endorsed.   History   Social History  . Marital Status: Married    Spouse Name: N/A    Number of Children: 3  . Years of Education: 11th   Occupational History  . disabled    Social History Main Topics  . Smoking status: Never Smoker   . Smokeless tobacco: Never Used  . Alcohol Use: No  . Drug Use: No  . Sexual Activity: Not Currently   Other Topics Concern  . Not on file   Social History Narrative  . No narrative on file    Family History  Problem Relation Age of Onset  . Hypertension      family history  . Alcohol abuse    . Alcohol abuse Mother   . Heart attack Mother   . Coronary artery disease Brother   . Heart attack Father   . Heart disease Sister   . Atrial fibrillation  Sister   . Hypertension Sister     Past Medical History  Diagnosis Date  . Anemia   . Depression   . Diabetes mellitus, type 2   . GERD (gastroesophageal reflux disease)   . Hyperlipidemia   . Hypertension   . Sleep apnea   . Asthma   . Hemorrhoids   . Fatigue   . Snoring disorder   . Memory deficit 10/04/2013  . Obesity   . Anxiety and depression   . Glaucoma   . Degenerative arthritis   .  Hypothyroid     Past Surgical History  Procedure Laterality Date  . Benign tumors resected    . Tubal ligation    . Rotator cuff repair    . Cataract extraction Left   . Refractive surgery      Glaucoma  . Foot surgery Left     bone spur    Current Outpatient Prescriptions  Medication Sig Dispense Refill  . albuterol (VENTOLIN HFA) 108 (90 BASE) MCG/ACT inhaler Inhale 2 puffs into the lungs every 6 (six) hours as needed. For shortness of breath  3 Inhaler  4  . alprazolam (XANAX) 2 MG tablet Take 1 tablet (2 mg total) by mouth 3 (three) times daily as needed. For anxiety  65 tablet  2  . amLODipine (NORVASC) 10 MG tablet Take 1 tablet (10 mg total) by mouth daily.  90 tablet  3  . aspirin 81 MG tablet Take 81 mg by mouth daily.      . carvedilol (COREG) 25 MG tablet TAKE ONE TABLET BY MOUTH TWICE DAILY WITH MEALS  180 tablet  3  . FREESTYLE LITE test strip USE TO CHECK BLOOD SUGAR DAILY  100 each  11  . gabapentin (NEURONTIN) 300 MG capsule Take 1 capsule (300 mg total) by mouth 3 (three) times daily.  90 capsule  11  . latanoprost (XALATAN) 0.005 % ophthalmic solution Place 1 drop into both eyes at bedtime.      . nateglinide (STARLIX) 120 MG tablet Take 1 tablet (120 mg total) by mouth 3 (three) times daily before meals.  90 tablet  11  . olmesartan-hydrochlorothiazide (BENICAR HCT) 20-12.5 MG per tablet Take 1 tablet by mouth daily.  30 tablet  1  . omeprazole (PRILOSEC) 40 MG capsule Take 1 capsule (40 mg total) by mouth daily.  90 capsule  3  . rosuvastatin (CRESTOR) 10 MG  tablet Take 10 mg by mouth daily.        . sitaGLIPtin (JANUVIA) 100 MG tablet Take 1 tablet (100 mg total) by mouth daily.  112 tablet  0  . montelukast (SINGULAIR) 10 MG tablet Take 10 mg by mouth at bedtime.        [name] oxyCODONE-acetaminophen (PERCOCET) 5-325 MG per tablet Take 1 tablet by mouth every 4 (four) hours as needed.  75 tablet  0   No current facility-administered medications for this visit.    Allergies as of 11/24/2013 - Review Complete 11/24/2013  Allergen Reaction Noted  . Metformin and related  02/23/2013  . Food Swelling 05/04/2012  . Penicillins Swelling and Rash     Vitals: BP 133/62  Pulse 87  Resp 18  Ht 5' 6.75" (1.695 m)  Wt 276 lb (125.193 kg)  BMI 43.58 kg/m2 Last Weight:  Wt Readings from Last 1 Encounters:  11/24/13 276 lb (125.193 kg)   Last Height:   Ht Readings from Last 1 Encounters:  11/24/13 5' 6.75" (1.695 m)     Physical exam:  General: The patient is awake, alert and appears not in acute distress. The patient is well groomed. Head: Normocephalic, atraumatic. Neck is supple. Mallampati3, neck circumference:17,5. Cardiovascular:  Regular rate and rhythm, without  murmurs or carotid bruit, and without distended neck veins. Respiratory: Lungs are clear to auscultation. Skin:  Without evidence of edema, or rash Trunk: BMI  Of   46.7 , morbidly obese.  elevated and patient  has normal posture.  Mental Status: Alert, oriented, thought content appropriate.  Speech fluent without evidence of  aphasia. Able to follow 3 step commands without difficulty. Cranial Nerves: II-Discs flat bilaterally. Visual fields grossly intact. III/IV/VI-Extraocular movements intact.  Pupils reactive bilaterally. V/VII-Smile symmetric VIII-grossly intact IX/X-normal gag XI-bilateral shoulder shrug XII-midline tongue extension Motor: 5/5 bilaterally with normal tone and bulk Sensory: Pinprick and light touch intact throughout, bilaterally Deep Tendon Reflexes:  2+ and symmetric throughout Plantars: Downgoing bilaterally Cerebellar: Normal finger-to-nose, normal rapid alternating movements and normal heel-to-shin test.  Wide  based gait and station.    Patient uses a new [employer]age full face mask. Medium size .   the BiPAP machine is ordered at 13 over 9 cm water , a  new BiPAP machine that has downloadable capacity. humdifier .    OSA education :   BMI is main risk factor. OSA is risk factor  For CAD , atrial fibrillation and for CVA.  Her memory loss is addressed by  Dr Jannifer Franklin.  Next visit with dr Jannifer Franklin for [facility] in April 2015.  She just had a negative stroke work up, doppler .                 Past Medical History  Diagnosis Date  . Anemia   . Depression   . Diabetes mellitus, type 2   . GERD (gastroesophageal reflux disease)   . Hyperlipidemia   . Hypertension   . Sleep apnea   . Asthma   . Hemorrhoids   . Fatigue   . Snoring disorder   . Memory deficit 10/04/2013  . Obesity   . Anxiety and depression   . Glaucoma   . Degenerative arthritis   . Hypothyroid     Past Surgical History  Procedure Laterality Date  . Benign tumors resected    . Tubal ligation    . Rotator cuff repair    . Cataract extraction Left   . Refractive surgery      Glaucoma  . Foot surgery Left     bone spur    Family History  Problem Relation Age of Onset  . Hypertension      family history  . Alcohol abuse    . Alcohol abuse Mother   . Heart attack Mother   . Coronary artery disease Brother   . Heart attack Father   . Heart disease Sister   . Atrial fibrillation Sister   . Hypertension Sister     Social history:  reports that she has never smoked. She has never used smokeless tobacco. She reports that she does not drink alcohol or use illicit drugs.    Allergies  Allergen Reactions  . Metformin And Related     diarrhea  . Food Swelling    bananas  . Penicillins Swelling and Rash    Medications:  Current Outpatient  Prescriptions on File Prior to Visit  Medication Sig Dispense Refill  . albuterol (VENTOLIN HFA) 108 (90 BASE) MCG/ACT inhaler Inhale 2 puffs into the lungs every 6 (six) hours as needed. For shortness of breath  3 Inhaler  4  . alprazolam (XANAX) 2 MG tablet Take 1 tablet (2 mg total) by mouth 3 (three) times daily as needed. For anxiety  65 tablet  2  . amLODipine (NORVASC) 10 MG tablet Take 1 tablet (10 mg total) by mouth daily.  90 tablet  3  . aspirin 81 MG tablet Take 81 mg by mouth daily.      . carvedilol (COREG) 25 MG tablet TAKE ONE TABLET BY MOUTH TWICE DAILY WITH MEALS  180 tablet  3  . FREESTYLE LITE test strip USE TO CHECK BLOOD SUGAR DAILY  100 each  11  . gabapentin (NEURONTIN) 300 MG capsule Take 1 capsule (300 mg total) by mouth 3 (three) times daily.  90 capsule  11  . latanoprost (XALATAN) 0.005 % ophthalmic solution Place 1 drop into both eyes at bedtime.      . nateglinide (STARLIX) 120 MG tablet Take 1 tablet (120 mg total) by mouth 3 (three) times daily before meals.  90 tablet  11  . olmesartan-hydrochlorothiazide (BENICAR HCT) 20-12.5 MG per tablet Take 1 tablet by mouth daily.  30 tablet  1  . omeprazole (PRILOSEC) 40 MG capsule Take 1 capsule (40 mg total) by mouth daily.  90 capsule  3  . rosuvastatin (CRESTOR) 10 MG tablet Take 10 mg by mouth daily.        . sitaGLIPtin (JANUVIA) 100 MG tablet Take 1 tablet (100 mg total) by mouth daily.  112 tablet  0  . montelukast (SINGULAIR) 10 MG tablet Take 10 mg by mouth at bedtime.        [name] oxyCODONE-acetaminophen (PERCOCET) 5-325 MG per tablet Take 1 tablet by mouth every 4 (four) hours as needed.  75 tablet  0   No current facility-administered medications on file prior to visit.     [facility] [address] [place], [place] [address]  Phone [phone] Fax [phone]

## 2013-11-24 NOTE — Patient Instructions (Signed)
Sleep Apnea Sleep apnea is disorder that affects a person's sleep. A person with sleep apnea has abnormal pauses in their breathing when they sleep. It is hard for them to get a good sleep. This makes a person tired during the day. It also can lead to other physical problems. There are three types of sleep apnea. One type is when breathing stops for a short time because your airway is blocked (obstructive sleep apnea). Another type is when the brain sometimes fails to give the normal signal to breathe to the muscles that control your breathing (central sleep apnea). The third type is a combination of the other two types. HOME CARE  Do not sleep on your back. Try to sleep on your side.  Take all medicine as told by your doctor.  Avoid alcohol, calming medicines (sedatives), and depressant drugs.  Try to lose weight if you are overweight. Talk to your doctor about a healthy weight goal. Your doctor may have you use a device that helps to open your airway. It can help you get the air that you need. It is called a positive airway pressure (PAP) device. There are three types of PAP devices:  Continuous positive airway pressure (CPAP) device.  Nasal expiratory positive airway pressure (EPAP) device.  Bilevel positive airway pressure (BPAP) device. MAKE SURE YOU:  Understand these instructions.  Will watch your condition.  Will get help right away if you are not doing well or get worse. Document Released: 09/08/2008 Document Revised: 11/16/2012 Document Reviewed: 04/02/2012 ExitCare Patient Information 2014 [employer]. CPAP and BIPAP Information CPAP and BIPAP are methods of helping you breathe with the use of air pressure. CPAP stands for "continuous positive airway pressure." BIPAP stands for "bi-level positive airway pressure." In both methods, air is blown into your air passages to help keep you breathing well. With CPAP, the amount of pressure stays the same while you breathe in and  out. CPAP is most commonly used for obstructive sleep apnea. For obstructive sleep apnea, CPAP works by holding your airways open so that they do not collapse when your muscles relax during sleep. BIPAP is similar to CPAP except the amount of pressure is increased when you inhale. This helps you take larger breaths. Your health care provider will recommend whether CPAP or BIPAP would be more helpful for you.  WHY ARE CPAP AND BIPAP TREATMENTS USED? CPAP or BIPAP can be helpful if you have:   Sleep apnea.   Chronic obstructive pulmonary disease (COPD).   Diseases that weaken the muscles of the chest, including muscular dystrophy or neurological diseases such as amyotrophic lateral sclerosis (ALS).   Other problems that cause breathing to be weak, abnormal, or difficult.  HOW IS CPAP OR BIPAP ADMINISTERED? Both CPAP and BIPAP are provided by a small machine with a flexible plastic tube that attaches to a plastic mask. The mask fits on your face, and air is blown into your air passages through your nose or mouth. The amount of pressure that is used to blow the air into your air passages can be set on the machine. Your health care provider will determine the pressure setting that should be used based on your individual needs.  WHEN SHOULD CPAP OR BIPAP BE USED? In most cases, the mask is worn only when sleeping. Generally, you will need to wear the mask throughout the night and during the daytime if you take a nap. In a few cases involving certain medical conditions, people also need to   wear the mask at other times when they are awake. Follow your health care provider's instructions for when to use the machine.  USING THE MASK  Because the mask needs to be snug, some people feel a trapped or closed-in feeling (claustrophobic) when first using the mask. You may need to get used to the mask gradually. To do this, you can first hold the mask loosely over your nose or mouth. Gradually apply the mask  more snugly. You can also gradually increase the amount of time that you use the mask.   Masks are available in various types and sizes. Some fit over your mouth and nose, and some fit over just your nose. If your mask does not fit well, talk to your health care provider about getting a different one.  If you are using a nasal mask and you tend to breathe through your mouth, a chin strap may be applied to help keep your mouth closed.   The CPAP and BIPAP machines have alarms that may sound if the mask comes off or develops a leak.   If you have trouble with the mask, it is very important that you talk to your health care provider about finding a way to make the mask easier to tolerate. Do not stop using the mask. This could have a negative impact on your health. TIPS FOR USING THE MACHINE  Place your CPAP or BIPAP machine on a secure table or stand near an electrical outlet.   Know where the on-off switch is located on the machine.   Follow your health care provider's instructions for how to set the pressure on your machine and when you should use it.   Do not eat or drink while the CPAP or BIPAP machine is on. Food or fluids could get pushed into your lungs by the pressure of the CPAP or BIPAP.  Do not smoke. Tobacco smoke residue can damage the machine.   For home use, CPAP and BIPAP machines can be rented or purchased through home health care companies. Many different brands of machines are available. Renting a machine before purchasing may help you find out which particular machine works well for you. SEEK IMMEDIATE MEDICAL CARE IF:  You have redness or open areas around your nose or mouth where the mask fits.   You have trouble operating the CPAP or BIPAP machine.   You cannot tolerate wearing the CPAP or BIPAP mask.  Document Released: 08/28/2004 Document Revised: 08/02/2013 Document Reviewed: 06/29/2013 Exit[employer] Patient Information 2014 [employer].  

## 2014-02-16 ENCOUNTER — Other Ambulatory Visit: Payer: Self-pay

## 2014-02-16 MED ORDER — GLUCOSE BLOOD VI STRP: ORAL_STRIP | Status: DC

## 2014-03-28 ENCOUNTER — Other Ambulatory Visit (INDEPENDENT_AMBULATORY_CARE_PROVIDER_SITE_OTHER): Payer: Medicare Other

## 2014-03-28 ENCOUNTER — Encounter: Payer: Self-pay | Admitting: Internal Medicine

## 2014-03-28 ENCOUNTER — Ambulatory Visit (INDEPENDENT_AMBULATORY_CARE_PROVIDER_SITE_OTHER): Payer: Medicare Other | Admitting: Internal Medicine

## 2014-03-28 VITALS — BP 142/88 | HR 85 | Temp 98.0°F | Resp 16 | Ht 66.75 in | Wt 278.0 lb

## 2014-03-28 DIAGNOSIS — E1129 Type 2 diabetes mellitus with other diabetic kidney complication: Secondary | ICD-10-CM

## 2014-03-28 DIAGNOSIS — E039 Hypothyroidism, unspecified: Secondary | ICD-10-CM

## 2014-03-28 DIAGNOSIS — E1165 Type 2 diabetes mellitus with hyperglycemia: Secondary | ICD-10-CM

## 2014-03-28 DIAGNOSIS — M48061 Spinal stenosis, lumbar region without neurogenic claudication: Secondary | ICD-10-CM

## 2014-03-28 DIAGNOSIS — I10 Essential (primary) hypertension: Secondary | ICD-10-CM

## 2014-03-28 DIAGNOSIS — G4700 Insomnia, unspecified: Secondary | ICD-10-CM

## 2014-03-28 DIAGNOSIS — Z6841 Body Mass Index (BMI) 40.0 and over, adult: Secondary | ICD-10-CM

## 2014-03-28 DIAGNOSIS — E785 Hyperlipidemia, unspecified: Secondary | ICD-10-CM

## 2014-03-28 DIAGNOSIS — M8580 Other specified disorders of bone density and structure, unspecified site: Secondary | ICD-10-CM | POA: Insufficient documentation

## 2014-03-28 DIAGNOSIS — D509 Iron deficiency anemia, unspecified: Secondary | ICD-10-CM

## 2014-03-28 DIAGNOSIS — Z1231 Encounter for screening mammogram for malignant neoplasm of breast: Secondary | ICD-10-CM

## 2014-03-28 LAB — BASIC METABOLIC PANEL
BUN: 11 mg/dL (ref 6–23)
CO2: 28 mEq/L (ref 19–32)
Calcium: 9.7 mg/dL (ref 8.4–10.5)
Chloride: 103 mEq/L (ref 96–112)
Creatinine, Ser: 0.8 mg/dL (ref 0.4–1.2)
GFR: 97.9 mL/min (ref >60.00)
Glucose, Bld: 100 mg/dL — ABNORMAL HIGH (ref 70–99)
Potassium: 4 mEq/L (ref 3.5–5.1)
Sodium: 140 mEq/L (ref 135–145)

## 2014-03-28 LAB — CBC WITH DIFFERENTIAL/PLATELET
Basophils Absolute: 0.1 10*3/uL (ref 0.0–0.1)
Basophils Relative: 0.5 % (ref 0.0–3.0)
Eosinophils Absolute: 0.3 10*3/uL (ref 0.0–0.7)
Eosinophils Relative: 2.2 % (ref 0.0–5.0)
HCT: 35.6 % — ABNORMAL LOW (ref 36.0–46.0)
Hemoglobin: 11.8 g/dL — ABNORMAL LOW (ref 12.0–15.0)
Lymphocytes Relative: 28.6 % (ref 12.0–46.0)
Lymphs Abs: 3.6 10*3/uL (ref 0.7–4.0)
MCHC: 33.1 g/dL (ref 30.0–36.0)
MCV: 87.9 fl (ref 78.0–100.0)
Monocytes Absolute: 1.1 10*3/uL — ABNORMAL HIGH (ref 0.1–1.0)
Monocytes Relative: 8.5 % (ref 3.0–12.0)
Neutro Abs: 7.5 10*3/uL (ref 1.4–7.7)
Neutrophils Relative %: 60.2 % (ref 43.0–77.0)
Platelets: 351 10*3/uL (ref 150.0–400.0)
RBC: 4.05 Mil/uL (ref 3.87–5.11)
RDW: 16 % — ABNORMAL HIGH (ref 11.5–14.6)
WBC: 12.5 10*3/uL — ABNORMAL HIGH (ref 4.5–10.5)

## 2014-03-28 LAB — HEMOGLOBIN A1C: Hgb A1c MFr Bld: 7.6 % — ABNORMAL HIGH (ref 4.6–6.5)

## 2014-03-28 LAB — LIPID PANEL
Cholesterol: 166 mg/dL (ref 0–200)
HDL: 48.9 mg/dL (ref >39.00)
LDL Cholesterol: 43 mg/dL (ref 0–99)
Total CHOL/HDL Ratio: 3
Triglycerides: 371 mg/dL — ABNORMAL HIGH (ref 0.0–149.0)
VLDL: 74.2 mg/dL — ABNORMAL HIGH (ref 0.0–40.0)

## 2014-03-28 LAB — TSH: TSH: 1.77 u[IU]/mL (ref 0.35–5.50)

## 2014-03-28 MED ORDER — DOXEPIN HCL 6 MG PO TABS: 1.0000 | ORAL_TABLET | Freq: Every evening | ORAL | Status: DC | PRN

## 2014-03-28 MED ORDER — OXYCODONE-ACETAMINOPHEN 5-325 MG PO TABS: 1.0000 | ORAL_TABLET | ORAL | Status: DC | PRN

## 2014-03-28 MED ORDER — GLUCOSE BLOOD VI STRP: ORAL_STRIP | Status: DC

## 2014-03-28 NOTE — Assessment & Plan Note (Signed)
She will try silenor for this 

## 2014-03-28 NOTE — Assessment & Plan Note (Signed)
I will recheck her CBC today 

## 2014-03-28 NOTE — Assessment & Plan Note (Signed)
She will cont taking percocet as needed I have asked her to f/up with neurosurgery to see if surgery is an option for her

## 2014-03-28 NOTE — Progress Notes (Signed)
Subjective:    Patient ID: Valerie [name], female    DOB: March 12, 1946, 68 y.o.   MRN: [id number]  Back Pain This is a recurrent problem. The current episode started more than 1 year ago. The problem occurs intermittently. The problem has been gradually worsening since onset. The pain is present in the lumbar spine. The quality of the pain is described as stabbing and shooting. The pain radiates to the left thigh. The pain is at a severity of 4/10. The pain is moderate. The pain is worse during the day. The symptoms are aggravated by bending and position. Associated symptoms include leg pain. Pertinent negatives include no abdominal pain, bladder incontinence, bowel incontinence, chest pain, dysuria, fever, headaches, numbness, paresis, paresthesias, pelvic pain, perianal numbness, tingling, weakness or weight loss. Risk factors include obesity and lack of exercise. She has tried analgesics for the symptoms. The treatment provided moderate relief.      Review of Systems  Constitutional: Positive for fatigue. Negative for fever, chills, weight loss, diaphoresis, activity change, appetite change and unexpected weight change.  HENT: Negative.   Eyes: Negative.   Respiratory: Negative.  Negative for cough, choking, chest tightness, shortness of breath, wheezing and stridor.   Cardiovascular: Negative.  Negative for chest pain, palpitations and leg swelling.  Gastrointestinal: Negative.  Negative for abdominal pain and bowel incontinence.  Endocrine: Negative.   Genitourinary: Negative.  Negative for bladder incontinence, dysuria and pelvic pain.  Musculoskeletal: Positive for back pain. Negative for arthralgias, gait problem, joint swelling, myalgias, neck pain and neck stiffness.  Skin: Negative.   Allergic/Immunologic: Negative.   Neurological: Positive for dizziness. Negative for tingling, tremors, seizures, syncope, facial asymmetry, speech difficulty, weakness, light-headedness, numbness,  headaches and paresthesias.  Hematological: Negative.  Negative for adenopathy. Does not bruise/bleed easily.  Psychiatric/Behavioral: Positive for sleep disturbance. Negative for suicidal ideas, hallucinations, behavioral problems, confusion, self-injury, dysphoric mood, decreased concentration and agitation. The patient is not nervous/anxious and is not hyperactive.        Objective:   Physical Exam  Vitals reviewed. Constitutional: She is oriented to person, place, and time. She appears well-developed and well-nourished. No distress.  HENT:  Head: Normocephalic and atraumatic.  Mouth/Throat: Oropharynx is clear and moist. No oropharyngeal exudate.  Eyes: Conjunctivae are normal. Right eye exhibits no discharge. Left eye exhibits no discharge. No scleral icterus.  Neck: Normal range of motion. Neck supple. No JVD present. No tracheal deviation present. No thyromegaly present.  Cardiovascular: Normal rate, regular rhythm and intact distal pulses.  Exam reveals no gallop and no friction rub.   No murmur heard. Pulmonary/Chest: Effort normal and breath sounds normal. No stridor. No respiratory distress. She has no wheezes. She has no rales. She exhibits no tenderness.  Abdominal: Soft. Bowel sounds are normal. She exhibits no distension and no mass. There is no tenderness. There is no rebound and no guarding.  Musculoskeletal: Normal range of motion. She exhibits no edema and no tenderness.  Lymphadenopathy:    She has no cervical adenopathy.  Neurological: She is alert and oriented to person, place, and time. She has normal reflexes. She displays normal reflexes. No cranial nerve deficit. She exhibits normal muscle tone. Coordination normal.  Neg SLR in BLE  Skin: Skin is warm and dry. No rash noted. She is not diaphoretic. No erythema. No pallor.  Psychiatric: She has a normal mood and affect. Her behavior is normal. Judgment and thought content normal.     Lab Results  Component Value  Date   WBC 11.4* 07/17/2013   HGB 11.9* 07/17/2013   HCT 36.3 07/17/2013   PLT 319.0 07/17/2013   GLUCOSE 96 10/26/2013   CHOL 142 01/20/2013   TRIG 211.0* 01/20/2013   HDL 43.40 01/20/2013   LDLDIRECT 61.3 01/20/2013   ALT 32 07/17/2013   AST 32 07/17/2013   NA 138 10/26/2013   K 3.6 10/26/2013   CL 101 10/26/2013   CREATININE 0.7 10/26/2013   BUN 14 10/26/2013   CO2 27 10/26/2013   TSH 1.51 10/26/2013   INR 0.97 07/12/2010   HGBA1C 7.0* 10/26/2013   MICROALBUR 3.2* 10/17/2010       Assessment & Plan:

## 2014-03-28 NOTE — Progress Notes (Signed)
Pre visit review using our clinic review tool, if applicable. No additional management support is needed unless otherwise documented below in the visit note. 

## 2014-03-28 NOTE — Assessment & Plan Note (Signed)
Her BP is well controlled Today I will monitor her lytes and renal function 

## 2014-03-28 NOTE — Assessment & Plan Note (Signed)
I will check her A1C and will monitor her renal function 

## 2014-03-28 NOTE — Patient Instructions (Signed)

## 2014-04-09 ENCOUNTER — Ambulatory Visit (INDEPENDENT_AMBULATORY_CARE_PROVIDER_SITE_OTHER): Payer: Medicare Other | Admitting: Neurology

## 2014-04-09 DIAGNOSIS — R209 Unspecified disturbances of skin sensation: Secondary | ICD-10-CM

## 2014-04-16 LAB — HM DEXA SCAN: HM Dexa Scan: NORMAL

## 2014-04-18 LAB — HM MAMMOGRAPHY: HM Mammogram: NORMAL

## 2014-04-19 NOTE — Progress Notes (Signed)
Note filed in error.

## 2014-04-30 ENCOUNTER — Encounter: Payer: Medicare Other | Admitting: Obstetrics & Gynecology

## 2014-05-04 LAB — HM DIABETES EYE EXAM

## 2014-05-07 ENCOUNTER — Encounter: Payer: Self-pay | Admitting: Internal Medicine

## 2014-05-08 ENCOUNTER — Telehealth: Payer: Self-pay | Admitting: *Deleted

## 2014-05-08 NOTE — Telephone Encounter (Signed)
Spoke with patient who is aware she will be seeing Megan on 05/18/14.

## 2014-05-10 ENCOUNTER — Ambulatory Visit: Payer: Medicare Other | Admitting: Neurology

## 2014-05-18 ENCOUNTER — Ambulatory Visit: Payer: Self-pay | Admitting: Adult Health

## 2014-05-18 ENCOUNTER — Telehealth: Payer: Self-pay | Admitting: Adult Health

## 2014-05-18 NOTE — Telephone Encounter (Signed)
Clinical assistant(Dana) made aware

## 2014-05-18 NOTE — Telephone Encounter (Signed)
Patient called to inform us that she needs to cancel her apt today at 2:30 as her husband is very ill. She will call us back to reschedule.

## 2014-06-06 ENCOUNTER — Ambulatory Visit (INDEPENDENT_AMBULATORY_CARE_PROVIDER_SITE_OTHER): Payer: Medicare Other | Admitting: Obstetrics & Gynecology

## 2014-06-06 DIAGNOSIS — Z0000 Encounter for general adult medical examination without abnormal findings: Secondary | ICD-10-CM

## 2014-06-06 NOTE — Progress Notes (Signed)
Patient age 68, here today for cervical screening. Per Dr. Roselie Awkward, screening no longer necessary due to age. Patient denies any questions or concerns or problems. Reports having mammogram 3 months ago. Patient does not need visit today. Not seen by provider.

## 2014-07-10 ENCOUNTER — Other Ambulatory Visit: Payer: Self-pay | Admitting: Neurosurgery

## 2014-07-10 DIAGNOSIS — M5416 Radiculopathy, lumbar region: Secondary | ICD-10-CM

## 2014-07-10 DIAGNOSIS — M47812 Spondylosis without myelopathy or radiculopathy, cervical region: Secondary | ICD-10-CM

## 2014-07-13 ENCOUNTER — Encounter: Payer: Self-pay | Admitting: Internal Medicine

## 2014-07-13 ENCOUNTER — Other Ambulatory Visit (INDEPENDENT_AMBULATORY_CARE_PROVIDER_SITE_OTHER): Payer: Medicare Other

## 2014-07-13 ENCOUNTER — Ambulatory Visit (INDEPENDENT_AMBULATORY_CARE_PROVIDER_SITE_OTHER): Payer: Medicare Other | Admitting: Internal Medicine

## 2014-07-13 VITALS — BP 130/80 | HR 81 | Temp 98.0°F | Resp 16 | Ht 65.0 in | Wt 280.1 lb

## 2014-07-13 DIAGNOSIS — E1129 Type 2 diabetes mellitus with other diabetic kidney complication: Secondary | ICD-10-CM

## 2014-07-13 DIAGNOSIS — E785 Hyperlipidemia, unspecified: Secondary | ICD-10-CM

## 2014-07-13 DIAGNOSIS — D509 Iron deficiency anemia, unspecified: Secondary | ICD-10-CM

## 2014-07-13 DIAGNOSIS — F418 Other specified anxiety disorders: Secondary | ICD-10-CM

## 2014-07-13 DIAGNOSIS — I10 Essential (primary) hypertension: Secondary | ICD-10-CM

## 2014-07-13 DIAGNOSIS — G4730 Sleep apnea, unspecified: Secondary | ICD-10-CM

## 2014-07-13 DIAGNOSIS — G4700 Insomnia, unspecified: Secondary | ICD-10-CM

## 2014-07-13 DIAGNOSIS — F341 Dysthymic disorder: Secondary | ICD-10-CM

## 2014-07-13 DIAGNOSIS — E1165 Type 2 diabetes mellitus with hyperglycemia: Principal | ICD-10-CM

## 2014-07-13 DIAGNOSIS — E039 Hypothyroidism, unspecified: Secondary | ICD-10-CM

## 2014-07-13 DIAGNOSIS — K7689 Other specified diseases of liver: Secondary | ICD-10-CM

## 2014-07-13 DIAGNOSIS — M48061 Spinal stenosis, lumbar region without neurogenic claudication: Secondary | ICD-10-CM

## 2014-07-13 DIAGNOSIS — Z23 Encounter for immunization: Secondary | ICD-10-CM

## 2014-07-13 DIAGNOSIS — K219 Gastro-esophageal reflux disease without esophagitis: Secondary | ICD-10-CM

## 2014-07-13 LAB — CBC WITH DIFFERENTIAL/PLATELET
Basophils Absolute: 0 10*3/uL (ref 0.0–0.1)
Basophils Relative: 0.2 % (ref 0.0–3.0)
Eosinophils Absolute: 0.2 10*3/uL (ref 0.0–0.7)
Eosinophils Relative: 2.3 % (ref 0.0–5.0)
HCT: 34.5 % — ABNORMAL LOW (ref 36.0–46.0)
Hemoglobin: 11.4 g/dL — ABNORMAL LOW (ref 12.0–15.0)
Lymphocytes Relative: 22.9 % (ref 12.0–46.0)
Lymphs Abs: 2.4 10*3/uL (ref 0.7–4.0)
MCHC: 33.1 g/dL (ref 30.0–36.0)
MCV: 88.1 fl (ref 78.0–100.0)
Monocytes Absolute: 0.9 10*3/uL (ref 0.1–1.0)
Monocytes Relative: 8.5 % (ref 3.0–12.0)
Neutro Abs: 6.9 10*3/uL (ref 1.4–7.7)
Neutrophils Relative %: 66.1 % (ref 43.0–77.0)
Platelets: 324 10*3/uL (ref 150.0–400.0)
RBC: 3.92 Mil/uL (ref 3.87–5.11)
RDW: 15.6 % — ABNORMAL HIGH (ref 11.5–15.5)
WBC: 10.5 10*3/uL (ref 4.0–10.5)

## 2014-07-13 LAB — BASIC METABOLIC PANEL
BUN: 12 mg/dL (ref 6–23)
CO2: 27 mEq/L (ref 19–32)
Calcium: 9.2 mg/dL (ref 8.4–10.5)
Chloride: 102 mEq/L (ref 96–112)
Creatinine, Ser: 0.7 mg/dL (ref 0.4–1.2)
GFR: 100.87 mL/min (ref >60.00)
Glucose, Bld: 179 mg/dL — ABNORMAL HIGH (ref 70–99)
Potassium: 3.7 mEq/L (ref 3.5–5.1)
Sodium: 138 mEq/L (ref 135–145)

## 2014-07-13 LAB — IBC PANEL
Iron: 61 ug/dL (ref 42–145)
Saturation Ratios: 15.7 % — ABNORMAL LOW (ref 20.0–50.0)
Transferrin: 276.7 mg/dL (ref 212.0–360.0)

## 2014-07-13 LAB — TSH: TSH: 3.67 u[IU]/mL (ref 0.35–4.50)

## 2014-07-13 LAB — HEMOGLOBIN A1C: Hgb A1c MFr Bld: 8 % — ABNORMAL HIGH (ref 4.6–6.5)

## 2014-07-13 LAB — FERRITIN: Ferritin: 60 ng/mL (ref 10.0–291.0)

## 2014-07-13 MED ORDER — SITAGLIPTIN PHOSPHATE 100 MG PO TABS: 100.0000 mg | ORAL_TABLET | Freq: Every day | ORAL | Status: DC

## 2014-07-13 MED ORDER — DOXEPIN HCL 6 MG PO TABS
1.0000 | ORAL_TABLET | Freq: Every evening | ORAL | Status: DC | PRN
Start: 2014-07-13 — End: 2015-08-29

## 2014-07-13 MED ORDER — ROSUVASTATIN CALCIUM 10 MG PO TABS: 10.0000 mg | ORAL_TABLET | Freq: Every day | ORAL | Status: DC

## 2014-07-13 MED ORDER — OXYCODONE-ACETAMINOPHEN 5-325 MG PO TABS: 1.0000 | ORAL_TABLET | ORAL | Status: DC | PRN

## 2014-07-13 MED ORDER — VILAZODONE HCL 10 & 20 & 40 MG PO KIT: 1.0000 | PACK | Freq: Every day | ORAL | Status: DC

## 2014-07-13 MED ORDER — OLMESARTAN MEDOXOMIL-HCTZ 20-12.5 MG PO TABS: 1.0000 | ORAL_TABLET | Freq: Every day | ORAL | Status: DC

## 2014-07-13 MED ORDER — VILAZODONE HCL 40 MG PO TABS: 40.0000 mg | ORAL_TABLET | Freq: Every day | ORAL | Status: DC

## 2014-07-13 NOTE — Progress Notes (Signed)
Pre visit review using our clinic review tool, if applicable. No additional management support is needed unless otherwise documented below in the visit note. 

## 2014-07-13 NOTE — Patient Instructions (Signed)

## 2014-07-13 NOTE — Progress Notes (Signed)
Subjective:    Patient ID: Valerie [name], female    DOB: Apr 14, 1946, 68 y.o.   MRN: [id number]  Diabetes She presents for her follow-up diabetic visit. She has type 2 diabetes mellitus. Her disease course has been fluctuating. Hypoglycemia symptoms include nervousness/anxiousness. Pertinent negatives for hypoglycemia include no confusion, dizziness, headaches or tremors. Pertinent negatives for diabetes include no blurred vision, no chest pain, no fatigue, no foot paresthesias, no foot ulcerations, no polydipsia, no polyphagia, no polyuria, no visual change, no weakness and no weight loss. There are no hypoglycemic complications. Symptoms are stable. There are no diabetic complications. Current diabetic treatment includes oral agent (dual therapy). She is compliant with treatment most of the time. Her weight is increasing steadily. She is following a generally unhealthy diet. When asked about meal planning, she reported none. She never participates in exercise. There is no change in her home blood glucose trend. An ACE inhibitor/angiotensin II receptor blocker is being taken. She sees a podiatrist.Eye exam is current.      Review of Systems  Constitutional: Negative.  Negative for fever, chills, weight loss, diaphoresis, activity change, appetite change, fatigue and unexpected weight change.  HENT: Negative.   Eyes: Negative.  Negative for blurred vision.  Respiratory: Positive for apnea. Negative for cough, choking, chest tightness, shortness of breath, wheezing and stridor.   Cardiovascular: Negative.  Negative for chest pain, palpitations and leg swelling.  Gastrointestinal: Negative.  Negative for nausea, vomiting, abdominal pain, diarrhea, constipation and blood in stool.  Endocrine: Negative.  Negative for polydipsia, polyphagia and polyuria.  Genitourinary: Negative.   Musculoskeletal: Positive for arthralgias and back pain. Negative for gait problem, joint swelling, neck pain and neck  stiffness.  Skin: Negative.  Negative for rash.  Allergic/Immunologic: Negative.   Neurological: Negative.  Negative for dizziness, tremors, weakness, light-headedness, numbness and headaches.  Hematological: Negative.  Negative for adenopathy. Does not bruise/bleed easily.  Psychiatric/Behavioral: Positive for sleep disturbance and dysphoric mood. Negative for suicidal ideas, hallucinations, behavioral problems, confusion, self-injury, decreased concentration and agitation. The patient is nervous/anxious. The patient is not hyperactive.        Objective:   Physical Exam  Vitals reviewed. Constitutional: She is oriented to person, place, and time. She appears well-developed and well-nourished. No distress.  HENT:  Head: Normocephalic and atraumatic.  Mouth/Throat: Oropharynx is clear and moist. No oropharyngeal exudate.  Eyes: Conjunctivae are normal. Right eye exhibits no discharge. Left eye exhibits no discharge. No scleral icterus.  Neck: Normal range of motion. Neck supple. No JVD present. No tracheal deviation present. No thyromegaly present.  Cardiovascular: Normal rate, regular rhythm, normal heart sounds and intact distal pulses.  Exam reveals no gallop and no friction rub.   No murmur heard. Pulmonary/Chest: Effort normal and breath sounds normal. No stridor. No respiratory distress. She has no wheezes. She has no rales. She exhibits no tenderness.  Abdominal: Soft. Bowel sounds are normal. She exhibits no distension and no mass. There is no tenderness. There is no rebound and no guarding.  Musculoskeletal: Normal range of motion. She exhibits no edema and no tenderness.  Lymphadenopathy:    She has no cervical adenopathy.  Neurological: She is oriented to person, place, and time.  Skin: Skin is warm and dry. No rash noted. She is not diaphoretic. No erythema. No pallor.  Psychiatric: Her behavior is normal. Judgment and thought content normal. Her mood appears anxious. Her affect  is not angry, not blunt, not labile and not inappropriate. Cognition and  memory are normal. Cognition and memory are not impaired. She exhibits a depressed mood. She expresses no homicidal and no suicidal ideation. She expresses no suicidal plans and no homicidal plans. She exhibits normal recent memory and normal remote memory.     Lab Results  Component Value Date   WBC 12.5* 03/28/2014   HGB 11.8* 03/28/2014   HCT 35.6* 03/28/2014   PLT 351.0 03/28/2014   GLUCOSE 100* 03/28/2014   CHOL 166 03/28/2014   TRIG 371.0* 03/28/2014   HDL 48.90 03/28/2014   LDLDIRECT 61.3 01/20/2013   LDLCALC 43 03/28/2014   ALT 32 07/17/2013   AST 32 07/17/2013   NA 140 03/28/2014   K 4.0 03/28/2014   CL 103 03/28/2014   CREATININE 0.8 03/28/2014   BUN 11 03/28/2014   CO2 28 03/28/2014   TSH 1.77 03/28/2014   INR 0.97 07/12/2010   HGBA1C 7.6* 03/28/2014   MICROALBUR 3.2* 10/17/2010       Assessment & Plan:

## 2014-07-16 ENCOUNTER — Encounter: Payer: Self-pay | Admitting: *Deleted

## 2014-07-16 ENCOUNTER — Telehealth: Payer: Self-pay | Admitting: Internal Medicine

## 2014-07-16 ENCOUNTER — Encounter: Payer: Medicare Other | Attending: Internal Medicine | Admitting: *Deleted

## 2014-07-16 VITALS — Ht 65.0 in | Wt 280.2 lb

## 2014-07-16 DIAGNOSIS — Z6841 Body Mass Index (BMI) 40.0 and over, adult: Secondary | ICD-10-CM | POA: Insufficient documentation

## 2014-07-16 DIAGNOSIS — Z713 Dietary counseling and surveillance: Secondary | ICD-10-CM | POA: Insufficient documentation

## 2014-07-16 DIAGNOSIS — IMO0001 Reserved for inherently not codable concepts without codable children: Secondary | ICD-10-CM | POA: Insufficient documentation

## 2014-07-16 DIAGNOSIS — E1165 Type 2 diabetes mellitus with hyperglycemia: Principal | ICD-10-CM

## 2014-07-16 DIAGNOSIS — E1129 Type 2 diabetes mellitus with other diabetic kidney complication: Secondary | ICD-10-CM

## 2014-07-16 NOTE — Assessment & Plan Note (Signed)
Her TSH is in the normal range, she will stay on the current dose 

## 2014-07-16 NOTE — Assessment & Plan Note (Signed)
She wants to have this treated again I have asked her to start Viibryd

## 2014-07-16 NOTE — Progress Notes (Signed)
Medical Nutrition Therapy:  Appt start time: 0945 end time:  1045.  Assessment:  Patient here today for diabetes education. She was seen by the RD here 2 years ago for diabetes and weight loss. She reports frustration with inability to control blood glucose and lose weight. However, she admits that she struggles to stick with dietary changes, choosing fast food, sweets, and sweetened drinks frequently. She checks her BG once daily with readings ranging from 130s-200. She is currently unable to exercise due to back pain secondary to spinal stenosis and recent foot surgery. As such, her lifestyle is very sedentary. We discussed the importance of slow weight loss with adequate protein to prevent muscle loss. She also complains of fluid retention, which causes weight to fluctuate.   A1c 8.0 in July 2015  MEDICATIONS: Januvia daily, nateglinide TID before meals, for others, see list   DIETARY INTAKE:   Usual eating pattern includes 2-3 meals and 2 snacks per day.  24-hr recall:  B ( AM): Sometimes skips, bacon, grits, eggs, toast, juice/milk/coffee (Sweet n Low or black)  Snk ( AM): None  L ( PM): Fast food, hamburger, fries OR leftovers, water/sweet tea/diet and regular [place] tea Snk ( PM): None D ( PM): Meat, starch, vegetable, water, sweet tea/Juenger tea Snk ( PM): Chips, ice cream (2 scoops), 3-5 cookies, 1-2 slices cake, usually several snacks in the evening Beverages: Water, coffee, sweet tea, diet and regular soda  Usual physical activity: None  Estimated energy needs: 1500 calories 169 g carbohydrates 94 g protein 50 g fat  Progress Towards Goal(s):  In progress.   Nutritional Diagnosis:  NB-1.6 Limited adherence to nutrition-related recommendations As related to diabetes.  As evidenced by limited compliance with previous RD recommendations.    Intervention:  Nutrition counseling. We discussed basic carb counting, including foods with carbs, label reading, portion size, and meal  planning. We discussed strategies for weight loss, including balancing nutrients (carbs, protein, fat), portion control, healthy snacks, and exercise.   Goals:  1. 2-3 carb servings per meal, 1 serving with snacks.  2. Eat regular meals and snacks. 3. Limit intake of sweetened drinks and sweets.  4. Choose healthy snacks: fruit, handful nuts 5. Prepare easy/fast meals per meal plan provided. Use breakfast shakes (</=250 kcal, 10 g protein) or breakfast bars for quick breakfast, make sandwich with fruit for lunch or leftovers, continue dinner as currently monitoring portion of meat and starch and increasing vegetables.   Handouts given during visit include:  Carb counting  Food labels  5 day 1500 calorie meal plan  Meal plan card  Monitoring/Evaluation:  Dietary intake, exercise, blood glucose, and body weight in 1 month(s).

## 2014-07-16 NOTE — Assessment & Plan Note (Signed)
Her BP is well controlled 

## 2014-07-16 NOTE — Assessment & Plan Note (Signed)
Her blood sugars are not well controlled She agrees to work on her lifestyle modifications

## 2014-07-16 NOTE — Assessment & Plan Note (Signed)
She will cont percocet as needed for pain 

## 2014-07-16 NOTE — Telephone Encounter (Signed)
Relevant patient education mailed to patient.  

## 2014-07-17 ENCOUNTER — Telehealth: Payer: Self-pay | Admitting: Internal Medicine

## 2014-07-17 ENCOUNTER — Ambulatory Visit (INDEPENDENT_AMBULATORY_CARE_PROVIDER_SITE_OTHER): Payer: Medicare Other | Admitting: Internal Medicine

## 2014-07-17 ENCOUNTER — Encounter: Payer: Self-pay | Admitting: Internal Medicine

## 2014-07-17 VITALS — BP 130/70 | HR 89 | Temp 98.1°F | Wt 280.5 lb

## 2014-07-17 DIAGNOSIS — T50Z95A Adverse effect of other vaccines and biological substances, initial encounter: Secondary | ICD-10-CM

## 2014-07-17 DIAGNOSIS — R609 Edema, unspecified: Secondary | ICD-10-CM

## 2014-07-17 NOTE — Progress Notes (Signed)
   Subjective:    Patient ID: Valerie [name], female    DOB: [birth year], 68 y.o.   MRN: [id number]  HPI   She has had localized swelling in the right deltoid area since she had a pneumonia shot 07/13/14. This was her second pneumonia shot; she had no reaction to the first.  She's had no extrinsic symptoms associated with this but only localized discomfort and swelling.  Additionally she's concerned about swelling of her legs which has been a problem for years. She has been on amlodipine for years.  She is not checking her blood pressure on a regular basis at this time.  She also is concerned about numbness in the left foot greater than right. It is yet to be determined whether this is related to a neurosurgical problem or diabetes.    Review of Systems   No associated itchy, watery eyes.  Swelling of the lips or tongue denied.  Shortness of breath, wheezing, or cough absent.  No rash or urticaria noted.  Fever ,chills , or sweats denied. Purulence absent.  Diarrhea not present.     Objective:   Physical Exam   Positive or pertinent findings include :  S4 with slight slurring. Heart sounds are somewhat distant. Abdomen is protuberant without organomegaly, masses, tenderness Lipedema suggested of the lower extremities w/o any pitting edema. Pedal pulses are equal but slightly decreased, especially posterior tibial pulses. There is a 7 x 7 cm indurated area over the right deltoid which is slightly warm to touch there is no associated lymphangitis or cellulitis findings.   General appearance :adequately nourished; in no distress.Very articulate individual. Eyes: No conjunctival inflammation or scleral icterus is present. Oral exam: Dental hygiene is good. Lips and gums are healthy appearing.There is no oropharyngeal erythema or exudate noted.  Heart:  Normal rate and regular rhythm. S1 and S2 normal without gallop, murmur, click, rub or other extra sounds   Lungs:Chest  clear to auscultation; no wheezes, rhonchi,rales ,or rubs present.No increased work of breathing.  Abdomen: bowel sounds normal, soft and non-tender without masses, organomegaly or hernias noted.  No guarding or rebound. No flank tenderness to percussion. Skin:Warm & dry.  Intact without suspicious lesions or rashes ; no jaundice or tenting Lymphatic: No lymphadenopathy is noted about the head, neck, axilla          Assessment & Plan:  #1 Local reaction to pneumonia vaccine without evidence of abscess or cellulitis  #2 chronic edema lower extremities; rule out amlodipine contribution.  See  after visit summary

## 2014-07-17 NOTE — Progress Notes (Signed)
Pre visit review using our clinic review tool, if applicable. No additional management support is needed unless otherwise documented below in the visit note. 

## 2014-07-17 NOTE — Patient Instructions (Signed)
Use warm moist compresses to 3 times a day to the affected area.  Report Warning  signs as discussed (red streaks, pus, fever, increasing pain).  Minimal Blood Pressure Goal= AVERAGE < 140/90;  Ideal is an AVERAGE < 135/85. This AVERAGE should be calculated from @ least 5-7 BP readings taken @ different times of day on different days of week. You should not respond to isolated BP readings , but rather the AVERAGE for that week .Please bring your  blood pressure cuff to office visits to verify that it is reliable.It  can also be checked against the blood pressure device at the pharmacy. Finger or wrist cuffs are not dependable; an arm cuff is.  Monitor your swelling in the feet and blood pressure off  amlodipine. If the swelling improved dramatically off this medicines and blood pressure goes up; a different agent would be necessary.  If the swelling disappears off amlodipine and blood pressure remains less than 140/90 on average;  new blood pressure medicine would not be needed.

## 2014-07-17 NOTE — Telephone Encounter (Signed)
Notified pt with md response. Pt has already made appt to see Dr. Linna Darner @ 1:15 doesn't think its a normal reaction. Arm been swollen since given & has a red rash and it itches. Advise pt to keep appt...Valerie [name]

## 2014-07-17 NOTE — Telephone Encounter (Signed)
Patient Information:  Caller Name: Dora  Phone: [phone]  Patient: Valerie [name]  Gender: Female  DOB: [birth year]  Age: 68 Years  PCP: [name] (Adults only)  Office Follow Up:  Does the office need to follow up with this patient?: Yes  Instructions For The Office: PCP not available. Scheduled with Dr. Linna Darner at 13:15.  Patient states she cannot come to the office due to dialysis appt . Scheduled later per patient request.  Please review.  Please review EPIC documentation on site administration.  Patient states her right arm received injection.  RN Note:  Patient states she cannot come due to dialysis appt.  Request appt later. Declined earlier appt.   Scheduled 1:15 with Dr. Linna Darner.  Home care advice and call back parameters reviewed. PLEASE REVIEW.  Symptoms  Reason For Call & Symptoms: Patient states she received a pneumonia shot on Friday 07/13/14 in right arm.  She noted soreness on Friday with pain and discomfort.  She states on Saturday 07/14/14, warmth to the area and rash. +itching.  She describes red streaks from the area  and describes entire upper arm as swollen and painful.  Afebrile.  Eating and drinking normally  Reviewed Health History In EMR: Yes  Reviewed Medications In EMR: Yes  Reviewed Allergies In EMR: Yes  Reviewed Surgeries / Procedures: Yes  Date of Onset of Symptoms: 07/13/2014  Treatments Tried: ice pack , neosporin  Treatments Tried Worked: Yes  Guideline(s) Used:  Adult Immunizations, Contraindications and Precautions  Immunization Reactions  Disposition Per Guideline:   Go to Office Now  Reason For Disposition Reached:   Redness or red streak around the injection site begins > 48 hours after shot  Advice Given:  Cold Pack for Local Reaction at Injection Site:  Apply a cold pack or ice in a wet washcloth to the area for 20 minutes. Repeat in 1 hour.  Then apply as needed for the first 48 hours after the injection (Reason: reduce the pain  and swelling).  Pain and Fever Medicines:  For pain or fever relief, take either acetaminophen or ibuprofen.  Ibuprofen (e.g., Motrin, Advil):  Take 400 mg (two 200 mg pills) by mouth every 6 hours.  Call Back If:  You become worse.  Patient Will Follow Care Advice:  YES  Appointment Scheduled:  07/17/2014 13:15:00 Appointment Scheduled Provider:  Unice Cobble

## 2014-07-17 NOTE — Telephone Encounter (Signed)
This sounds like a normal reaction to the pneumovax

## 2014-07-20 ENCOUNTER — Ambulatory Visit
Admission: RE | Admit: 2014-07-20 | Discharge: 2014-07-20 | Disposition: A | Discharge status: Home / Self Care | Payer: Medicare Other | Source: Ambulatory Visit | Attending: Neurosurgery | Admitting: Neurosurgery

## 2014-07-20 DIAGNOSIS — M5416 Radiculopathy, lumbar region: Secondary | ICD-10-CM

## 2014-07-20 DIAGNOSIS — M47812 Spondylosis without myelopathy or radiculopathy, cervical region: Secondary | ICD-10-CM

## 2014-07-24 ENCOUNTER — Encounter: Payer: Self-pay | Admitting: *Deleted

## 2014-10-07 ENCOUNTER — Other Ambulatory Visit: Payer: Self-pay | Admitting: Internal Medicine

## 2014-10-15 ENCOUNTER — Other Ambulatory Visit: Payer: Self-pay | Admitting: Endocrinology

## 2014-10-15 NOTE — Telephone Encounter (Signed)
Please advise if ok to refill pt has not been seen since 09/16/2013 thanks!

## 2014-10-15 NOTE — Telephone Encounter (Signed)
Please refill x 1 Ov is due  

## 2014-11-28 ENCOUNTER — Ambulatory Visit: Payer: Medicare Other | Admitting: Neurology

## 2014-11-29 ENCOUNTER — Encounter: Payer: Self-pay | Admitting: Internal Medicine

## 2014-11-29 ENCOUNTER — Other Ambulatory Visit (INDEPENDENT_AMBULATORY_CARE_PROVIDER_SITE_OTHER): Payer: Medicare Other

## 2014-11-29 ENCOUNTER — Ambulatory Visit (INDEPENDENT_AMBULATORY_CARE_PROVIDER_SITE_OTHER): Payer: Medicare Other | Admitting: Internal Medicine

## 2014-11-29 VITALS — BP 140/90 | HR 88 | Temp 97.7°F | Resp 16 | Ht 65.0 in | Wt 277.0 lb

## 2014-11-29 DIAGNOSIS — M48061 Spinal stenosis, lumbar region without neurogenic claudication: Secondary | ICD-10-CM

## 2014-11-29 DIAGNOSIS — I10 Essential (primary) hypertension: Secondary | ICD-10-CM

## 2014-11-29 DIAGNOSIS — D509 Iron deficiency anemia, unspecified: Secondary | ICD-10-CM

## 2014-11-29 DIAGNOSIS — E038 Other specified hypothyroidism: Secondary | ICD-10-CM

## 2014-11-29 DIAGNOSIS — Z23 Encounter for immunization: Secondary | ICD-10-CM

## 2014-11-29 DIAGNOSIS — E785 Hyperlipidemia, unspecified: Secondary | ICD-10-CM

## 2014-11-29 DIAGNOSIS — E118 Type 2 diabetes mellitus with unspecified complications: Secondary | ICD-10-CM

## 2014-11-29 DIAGNOSIS — M4806 Spinal stenosis, lumbar region: Secondary | ICD-10-CM

## 2014-11-29 LAB — LIPID PANEL
Cholesterol: 155 mg/dL (ref 0–200)
HDL: 40.3 mg/dL (ref >39.00)
NonHDL: 114.7
Total CHOL/HDL Ratio: 4
Triglycerides: 261 mg/dL — ABNORMAL HIGH (ref 0.0–149.0)
VLDL: 52.2 mg/dL — ABNORMAL HIGH (ref 0.0–40.0)

## 2014-11-29 LAB — CBC WITH DIFFERENTIAL/PLATELET
Basophils Absolute: 0 10*3/uL (ref 0.0–0.1)
Basophils Relative: 0.3 % (ref 0.0–3.0)
Eosinophils Absolute: 0.4 10*3/uL (ref 0.0–0.7)
Eosinophils Relative: 4.2 % (ref 0.0–5.0)
HCT: 34.6 % — ABNORMAL LOW (ref 36.0–46.0)
Hemoglobin: 11.3 g/dL — ABNORMAL LOW (ref 12.0–15.0)
Lymphocytes Relative: 28 % (ref 12.0–46.0)
Lymphs Abs: 3 10*3/uL (ref 0.7–4.0)
MCHC: 32.6 g/dL (ref 30.0–36.0)
MCV: 88.7 fl (ref 78.0–100.0)
Monocytes Absolute: 0.9 10*3/uL (ref 0.1–1.0)
Monocytes Relative: 8.1 % (ref 3.0–12.0)
Neutro Abs: 6.3 10*3/uL (ref 1.4–7.7)
Neutrophils Relative %: 59.4 % (ref 43.0–77.0)
Platelets: 326 10*3/uL (ref 150.0–400.0)
RBC: 3.9 Mil/uL (ref 3.87–5.11)
RDW: 15.8 % — ABNORMAL HIGH (ref 11.5–15.5)
WBC: 10.6 10*3/uL — ABNORMAL HIGH (ref 4.0–10.5)

## 2014-11-29 LAB — URINALYSIS, ROUTINE W REFLEX MICROSCOPIC
Bilirubin Urine: NEGATIVE
Hgb urine dipstick: NEGATIVE
Ketones, ur: NEGATIVE
Leukocytes, UA: NEGATIVE
Nitrite: NEGATIVE
RBC / HPF: NONE SEEN (ref 0–?)
Specific Gravity, Urine: 1.025 (ref 1.000–1.030)
Total Protein, Urine: NEGATIVE
Urine Glucose: NEGATIVE
Urobilinogen, UA: 0.2 (ref 0.0–1.0)
pH: 6 (ref 5.0–8.0)

## 2014-11-29 LAB — BASIC METABOLIC PANEL
BUN: 17 mg/dL (ref 6–23)
CO2: 26 mEq/L (ref 19–32)
Calcium: 9.5 mg/dL (ref 8.4–10.5)
Chloride: 102 mEq/L (ref 96–112)
Creatinine, Ser: 0.9 mg/dL (ref 0.4–1.2)
GFR: 84.71 mL/min (ref >60.00)
Glucose, Bld: 211 mg/dL — ABNORMAL HIGH (ref 70–99)
Potassium: 4.1 mEq/L (ref 3.5–5.1)
Sodium: 138 mEq/L (ref 135–145)

## 2014-11-29 LAB — TSH: TSH: 4.23 u[IU]/mL (ref 0.35–4.50)

## 2014-11-29 LAB — MICROALBUMIN / CREATININE URINE RATIO
Creatinine,U: 164 mg/dL
Microalb Creat Ratio: 1.6 mg/g (ref 0.0–30.0)
Microalb, Ur: 2.7 mg/dL — ABNORMAL HIGH (ref 0.0–1.9)

## 2014-11-29 LAB — HEMOGLOBIN A1C: Hgb A1c MFr Bld: 8.3 % — ABNORMAL HIGH (ref 4.6–6.5)

## 2014-11-29 LAB — LDL CHOLESTEROL, DIRECT: Direct LDL: 71.2 mg/dL

## 2014-11-29 MED ORDER — OXYCODONE-ACETAMINOPHEN 5-325 MG PO TABS: 1.0000 | ORAL_TABLET | ORAL | Status: DC | PRN

## 2014-11-29 MED ORDER — SITAGLIP PHOS-METFORMIN HCL ER 100-1000 MG PO TB24: 1.0000 | ORAL_TABLET | Freq: Every day | ORAL | Status: DC

## 2014-11-29 NOTE — Progress Notes (Signed)
Subjective:    Patient ID: Valerie [name], female    DOB: [birth year], 68 y.o.   MRN: [id number]  Diabetes She presents for her follow-up diabetic visit. She has type 2 diabetes mellitus. Her disease course has been stable. Hypoglycemia symptoms include nervousness/anxiousness. Pertinent negatives for diabetes include no blurred vision, no chest pain, no fatigue, no foot paresthesias, no foot ulcerations, no polydipsia, no polyphagia, no polyuria, no visual change, no weakness and no weight loss. There are no hypoglycemic complications. Symptoms are stable. There are no diabetic complications. Current diabetic treatment includes oral agent (dual therapy). She is compliant with treatment all of the time. She is following a generally unhealthy diet. When asked about meal planning, she reported none. She has not had a previous visit with a dietitian. She never participates in exercise. There is no change in her home blood glucose trend. An ACE inhibitor/angiotensin II receptor blocker is being taken. She does not see a podiatrist.Eye exam is current.      Review of Systems  Constitutional: Negative.  Negative for chills, weight loss, diaphoresis, appetite change and fatigue.  HENT: Negative.   Eyes: Negative.  Negative for blurred vision.  Respiratory: Negative.  Negative for cough, choking, chest tightness, shortness of breath and stridor.   Cardiovascular: Negative.  Negative for chest pain, palpitations and leg swelling.  Gastrointestinal: Negative.  Negative for nausea, vomiting, abdominal pain, diarrhea, constipation and blood in stool.  Endocrine: Negative.  Negative for polydipsia, polyphagia and polyuria.  Genitourinary: Negative.   Musculoskeletal: Positive for back pain and arthralgias. Negative for myalgias, joint swelling and neck stiffness.  Skin: Negative.  Negative for rash.  Allergic/Immunologic: Negative.   Neurological: Negative.  Negative for weakness.  Hematological:  Negative.  Negative for adenopathy. Does not bruise/bleed easily.  Psychiatric/Behavioral: Positive for sleep disturbance. Negative for suicidal ideas, behavioral problems, self-injury, dysphoric mood, decreased concentration and agitation. The patient is nervous/anxious.        Objective:   Physical Exam  Constitutional: She is oriented to person, place, and time. She appears well-developed and well-nourished. No distress.  HENT:  Head: Normocephalic and atraumatic.  Mouth/Throat: Oropharynx is clear and moist. No oropharyngeal exudate.  Eyes: Conjunctivae are normal. Right eye exhibits no discharge. Left eye exhibits no discharge. No scleral icterus.  Neck: Normal range of motion. Neck supple. No JVD present. No tracheal deviation present. No thyromegaly present.  Cardiovascular: Normal rate, regular rhythm, normal heart sounds and intact distal pulses.  Exam reveals no gallop and no friction rub.   No murmur heard. Pulmonary/Chest: Effort normal and breath sounds normal. No stridor. No respiratory distress. She has no wheezes. She has no rales. She exhibits no tenderness.  Abdominal: Soft. Bowel sounds are normal. She exhibits no distension and no mass. There is no tenderness. There is no rebound and no guarding.  Musculoskeletal: Normal range of motion. She exhibits no edema or tenderness.  Lymphadenopathy:    She has no cervical adenopathy.  Neurological: She is oriented to person, place, and time.  Skin: Skin is warm and dry. No rash noted. She is not diaphoretic. No erythema. No pallor.  Vitals reviewed.    Lab Results  Component Value Date   WBC 10.5 07/13/2014   HGB 11.4* 07/13/2014   HCT 34.5* 07/13/2014   PLT 324.0 07/13/2014   GLUCOSE 179* 07/13/2014   CHOL 166 03/28/2014   TRIG 371.0* 03/28/2014   HDL 48.90 03/28/2014   LDLDIRECT 61.3 01/20/2013   LDLCALC 43 03/28/2014  ALT 32 07/17/2013   AST 32 07/17/2013   NA 138 07/13/2014   K 3.7 07/13/2014   CL 102  07/13/2014   CREATININE 0.7 07/13/2014   BUN 12 07/13/2014   CO2 27 07/13/2014   TSH 3.67 07/13/2014   INR 0.97 07/12/2010   HGBA1C 8.0* 07/13/2014   MICROALBUR 3.2* 10/17/2010       Assessment & Plan:

## 2014-11-30 LAB — HM DIABETES EYE EXAM

## 2014-12-02 MED ORDER — INSULIN DETEMIR 100 UNIT/ML FLEXPEN: 30.0000 [IU] | PEN_INJECTOR | Freq: Every day | SUBCUTANEOUS | Status: DC

## 2014-12-02 NOTE — Assessment & Plan Note (Signed)
She has achieved her LDL goal 

## 2014-12-02 NOTE — Assessment & Plan Note (Signed)
Her BP is well controlled Her lytes and renal function are stable 

## 2014-12-02 NOTE — Assessment & Plan Note (Signed)
She will cont percocet as needed for pain

## 2014-12-02 NOTE — Assessment & Plan Note (Signed)
Her blood sugars are too high, I have asked her to improve on her lifestyle modifications and I have asked her to start a basal dose of insulin

## 2014-12-02 NOTE — Assessment & Plan Note (Signed)
Her TSH is on the normal range 

## 2015-01-10 ENCOUNTER — Ambulatory Visit (INDEPENDENT_AMBULATORY_CARE_PROVIDER_SITE_OTHER): Payer: Medicare Other | Admitting: Internal Medicine

## 2015-01-10 DIAGNOSIS — I10 Essential (primary) hypertension: Secondary | ICD-10-CM

## 2015-01-14 ENCOUNTER — Ambulatory Visit (INDEPENDENT_AMBULATORY_CARE_PROVIDER_SITE_OTHER): Payer: Medicare Other | Admitting: Internal Medicine

## 2015-01-14 ENCOUNTER — Encounter: Payer: Self-pay | Admitting: Internal Medicine

## 2015-01-14 VITALS — BP 148/90 | HR 82 | Temp 98.7°F | Resp 16 | Ht 65.0 in | Wt 278.0 lb

## 2015-01-14 DIAGNOSIS — E118 Type 2 diabetes mellitus with unspecified complications: Secondary | ICD-10-CM

## 2015-01-14 DIAGNOSIS — E038 Other specified hypothyroidism: Secondary | ICD-10-CM

## 2015-01-14 DIAGNOSIS — Z6841 Body Mass Index (BMI) 40.0 and over, adult: Secondary | ICD-10-CM

## 2015-01-14 DIAGNOSIS — I10 Essential (primary) hypertension: Secondary | ICD-10-CM

## 2015-01-14 MED ORDER — INSULIN PEN NEEDLE 31G X 5 MM MISC: Status: DC

## 2015-01-14 MED ORDER — INSULIN GLARGINE 300 UNIT/ML ~~LOC~~ SOPN: 30.0000 [IU] | PEN_INJECTOR | Freq: Every day | SUBCUTANEOUS | Status: DC

## 2015-01-14 NOTE — Assessment & Plan Note (Signed)
She is interested in inquiring about bariatric surgery, referral done

## 2015-01-14 NOTE — Assessment & Plan Note (Signed)
Her BP is adequately well controlled Her lytes and renal function are stable 

## 2015-01-14 NOTE — Progress Notes (Signed)
Subjective:    Patient ID: Valerie [name], female    DOB: [birth year], 69 y.o.   MRN: [id number]  HPI Comments: She complains that her blood sugar has been spiking up into the 300's and when that happens shoe feels poorly with fatigue and dizziness.  Hypertension This is a chronic problem. The current episode started more than 1 year ago. The problem has been gradually worsening since onset. The problem is uncontrolled. Associated symptoms include malaise/fatigue. Pertinent negatives include no anxiety, blurred vision, chest pain, headaches, neck pain, orthopnea, palpitations, peripheral edema, PND, shortness of breath or sweats. The current treatment provides mild improvement. Compliance problems include diet and exercise.       Review of Systems  Constitutional: Positive for malaise/fatigue and fatigue. Negative for fever, chills, diaphoresis and appetite change.  HENT: Negative.   Eyes: Negative.  Negative for blurred vision.  Respiratory: Negative.  Negative for cough, choking, chest tightness, shortness of breath and stridor.        She denies DOE  Cardiovascular: Negative.  Negative for chest pain, palpitations, orthopnea, leg swelling and PND.  Gastrointestinal: Negative.  Negative for nausea, vomiting, abdominal pain, diarrhea and constipation.  Endocrine: Negative.   Genitourinary: Negative.   Musculoskeletal: Negative.  Negative for myalgias, back pain, arthralgias and neck pain.  Skin: Negative.  Negative for rash.  Allergic/Immunologic: Negative.   Neurological: Positive for dizziness. Negative for tremors, seizures, weakness, light-headedness, numbness and headaches.  Hematological: Negative.  Negative for adenopathy. Does not bruise/bleed easily.  Psychiatric/Behavioral: Negative.        Objective:   Physical Exam  Constitutional: She is oriented to person, place, and time. She appears well-developed and well-nourished. No distress.  HENT:  Head: Normocephalic and  atraumatic.  Mouth/Throat: Oropharynx is clear and moist. No oropharyngeal exudate.  Eyes: Conjunctivae are normal. Right eye exhibits no discharge. Left eye exhibits no discharge. No scleral icterus.  Neck: Normal range of motion. Neck supple. No JVD present. No tracheal deviation present. No thyromegaly present.  Cardiovascular: Normal rate, regular rhythm, normal heart sounds and intact distal pulses.  Exam reveals no gallop and no friction rub.   No murmur heard. Pulmonary/Chest: Effort normal and breath sounds normal. No stridor. No respiratory distress. She has no wheezes. She has no rales. She exhibits no tenderness.  Abdominal: Soft. Bowel sounds are normal. She exhibits no distension and no mass. There is no tenderness. There is no rebound and no guarding.  Musculoskeletal: Normal range of motion. She exhibits no edema or tenderness.  Lymphadenopathy:    She has no cervical adenopathy.  Neurological: She is oriented to person, place, and time.  Skin: Skin is warm and dry. No rash noted. She is not diaphoretic. No erythema. No pallor.  Vitals reviewed.    Lab Results  Component Value Date   WBC 10.6* 11/29/2014   HGB 11.3* 11/29/2014   HCT 34.6* 11/29/2014   PLT 326.0 11/29/2014   GLUCOSE 211* 11/29/2014   CHOL 155 11/29/2014   TRIG 261.0* 11/29/2014   HDL 40.30 11/29/2014   LDLDIRECT 71.2 11/29/2014   LDLCALC 43 03/28/2014   ALT 32 07/17/2013   AST 32 07/17/2013   NA 138 11/29/2014   K 4.1 11/29/2014   CL 102 11/29/2014   CREATININE 0.9 11/29/2014   BUN 17 11/29/2014   CO2 26 11/29/2014   TSH 4.23 11/29/2014   INR 0.97 07/12/2010   HGBA1C 8.3* 11/29/2014   MICROALBUR 2.7* 11/29/2014  Assessment & Plan:

## 2015-01-14 NOTE — Progress Notes (Signed)
Pre visit review using our clinic review tool, if applicable. No additional management support is needed unless otherwise documented below in the visit note. 

## 2015-01-14 NOTE — Assessment & Plan Note (Signed)
Her blood sugars are not well controlled When I last saw her I asked that she start levemir which she has not done yet Today, I gave he samples of Toujeo and showed her how to use it Will also refer for diabetic education

## 2015-01-14 NOTE — Patient Instructions (Signed)

## 2015-01-14 NOTE — Addendum Note (Signed)
Addended by: Janith Lima on: 01/14/2015 01:42 PM   Modules accepted: Orders, SmartSet

## 2015-01-17 NOTE — Progress Notes (Signed)
Not seen this day

## 2015-01-18 ENCOUNTER — Telehealth: Payer: Self-pay | Admitting: Internal Medicine

## 2015-01-18 ENCOUNTER — Other Ambulatory Visit: Payer: Self-pay | Admitting: Internal Medicine

## 2015-01-18 MED ORDER — AMLODIPINE BESYLATE 10 MG PO TABS: 10.0000 mg | ORAL_TABLET | Freq: Every day | ORAL | Status: DC

## 2015-01-18 NOTE — Telephone Encounter (Signed)
Pt called stated that Benicar and JANUMET XR is too expensive, can Dr. Ronnald Ramp send some thing cheaper. Pt also wondering if we have any sample of those med.

## 2015-01-18 NOTE — Telephone Encounter (Signed)
We will get samples of janumet next wednesday

## 2015-01-18 NOTE — Telephone Encounter (Signed)
Pt informed of sample arriving on Wednesday.   States that benicar is too expensive too.

## 2015-01-21 ENCOUNTER — Other Ambulatory Visit: Payer: Self-pay | Admitting: Internal Medicine

## 2015-01-21 DIAGNOSIS — I10 Essential (primary) hypertension: Secondary | ICD-10-CM

## 2015-01-21 DIAGNOSIS — E118 Type 2 diabetes mellitus with unspecified complications: Secondary | ICD-10-CM

## 2015-01-21 MED ORDER — VALSARTAN-HYDROCHLOROTHIAZIDE 320-12.5 MG PO TABS: 1.0000 | ORAL_TABLET | Freq: Every day | ORAL | Status: DC

## 2015-01-21 NOTE — Telephone Encounter (Signed)
Benicar/hct was changed to a generic

## 2015-01-30 ENCOUNTER — Ambulatory Visit (INDEPENDENT_AMBULATORY_CARE_PROVIDER_SITE_OTHER): Payer: Medicare Other | Admitting: Internal Medicine

## 2015-01-30 ENCOUNTER — Encounter: Payer: Self-pay | Admitting: Internal Medicine

## 2015-01-30 VITALS — BP 160/88 | HR 82 | Temp 98.3°F | Resp 16 | Ht 65.0 in | Wt 278.0 lb

## 2015-01-30 DIAGNOSIS — I10 Essential (primary) hypertension: Secondary | ICD-10-CM

## 2015-01-30 NOTE — Patient Instructions (Signed)

## 2015-01-30 NOTE — Progress Notes (Signed)
   Subjective:    Patient ID: Valerie [name], female    DOB: May 17, 1946, 69 y.o.   MRN: [id number]  Hypertension This is a chronic problem. The current episode started more than 1 year ago. The problem has been gradually worsening since onset. The problem is uncontrolled. Associated symptoms include anxiety. Pertinent negatives include no blurred vision, chest pain, headaches, malaise/fatigue, neck pain, orthopnea, palpitations, peripheral edema, PND, shortness of breath or sweats. Treatments tried: she has not started the ARB/HCTZ combo yet. The current treatment provides mild improvement. Compliance problems include diet, exercise and psychosocial issues.       Review of Systems  Constitutional: Negative.  Negative for fever, chills, malaise/fatigue, diaphoresis, appetite change and fatigue.  HENT: Negative.   Eyes: Negative.  Negative for blurred vision.  Respiratory: Negative.  Negative for cough, choking, chest tightness, shortness of breath and stridor.   Cardiovascular: Negative.  Negative for chest pain, palpitations, orthopnea, leg swelling and PND.  Gastrointestinal: Negative.  Negative for nausea, vomiting, abdominal pain, diarrhea, constipation and blood in stool.  Endocrine: Negative.   Genitourinary: Negative.   Musculoskeletal: Negative.  Negative for myalgias, back pain, arthralgias and neck pain.  Skin: Negative.  Negative for rash.  Allergic/Immunologic: Negative.   Neurological: Positive for dizziness. Negative for headaches.  Hematological: Negative.  Negative for adenopathy. Does not bruise/bleed easily.  Psychiatric/Behavioral: Negative.        Objective:   Physical Exam  Constitutional: She is oriented to person, place, and time. She appears well-developed and well-nourished. No distress.  HENT:  Head: Normocephalic and atraumatic.  Mouth/Throat: Oropharynx is clear and moist. No oropharyngeal exudate.  Eyes: Conjunctivae are normal. Right eye exhibits no  discharge. Left eye exhibits no discharge. No scleral icterus.  Neck: Normal range of motion. Neck supple. No JVD present. No tracheal deviation present. No thyromegaly present.  Cardiovascular: Normal rate, regular rhythm, normal heart sounds and intact distal pulses.  Exam reveals no gallop and no friction rub.   No murmur heard. Pulmonary/Chest: Effort normal and breath sounds normal. No stridor. No respiratory distress. She has no wheezes. She has no rales. She exhibits no tenderness.  Abdominal: Soft. Bowel sounds are normal. She exhibits no distension and no mass. There is no tenderness. There is no rebound and no guarding.  Musculoskeletal: Normal range of motion. She exhibits no edema or tenderness.  Lymphadenopathy:    She has no cervical adenopathy.  Neurological: She is oriented to person, place, and time.  Skin: Skin is warm and dry. No rash noted. She is not diaphoretic. No erythema. No pallor.  Psychiatric: She has a normal mood and affect. Her behavior is normal. Thought content normal.  Vitals reviewed.    Lab Results  Component Value Date   WBC 10.6* 11/29/2014   HGB 11.3* 11/29/2014   HCT 34.6* 11/29/2014   PLT 326.0 11/29/2014   GLUCOSE 211* 11/29/2014   CHOL 155 11/29/2014   TRIG 261.0* 11/29/2014   HDL 40.30 11/29/2014   LDLDIRECT 71.2 11/29/2014   LDLCALC 43 03/28/2014   ALT 32 07/17/2013   AST 32 07/17/2013   NA 138 11/29/2014   K 4.1 11/29/2014   CL 102 11/29/2014   CREATININE 0.9 11/29/2014   BUN 17 11/29/2014   CO2 26 11/29/2014   TSH 4.23 11/29/2014   INR 0.97 07/12/2010   HGBA1C 8.3* 11/29/2014   MICROALBUR 2.7* 11/29/2014       Assessment & Plan:

## 2015-01-30 NOTE — Progress Notes (Signed)
Pre visit review using our clinic review tool, if applicable. No additional management support is needed unless otherwise documented below in the visit note. 

## 2015-01-31 ENCOUNTER — Encounter: Payer: Self-pay | Admitting: Internal Medicine

## 2015-01-31 NOTE — Assessment & Plan Note (Signed)
Her BP is not well controlled and it is causing her symptoms She agrees to start the ARB/HCTZ combo as directed and to start working on her lifestyle modifications

## 2015-02-01 ENCOUNTER — Telehealth: Payer: Self-pay | Admitting: Internal Medicine

## 2015-02-01 DIAGNOSIS — I10 Essential (primary) hypertension: Secondary | ICD-10-CM

## 2015-02-01 MED ORDER — LOSARTAN POTASSIUM-HCTZ 100-12.5 MG PO TABS: 1.0000 | ORAL_TABLET | Freq: Every day | ORAL | Status: DC

## 2015-02-01 NOTE — Telephone Encounter (Signed)
changed

## 2015-02-01 NOTE — Telephone Encounter (Signed)
Patient states valsartan- hydrochlorothiazide in not on her insurance formulary.  She States that the script is high even with a PA.  She states insurance will pay for the med apart - Irbesartan, valsartan, telmisarpan, hydrocholorthiazide, or lasartan - potassium.

## 2015-02-06 ENCOUNTER — Encounter: Payer: Medicare Other | Attending: Internal Medicine | Admitting: *Deleted

## 2015-02-06 ENCOUNTER — Encounter: Payer: Self-pay | Admitting: *Deleted

## 2015-02-06 NOTE — Patient Instructions (Signed)
Plan:  Aim for 2 Carb Choices per meal (30 grams) +/- 1 either way  Aim for 0-15 Carbs per snack if hungry  Include protein in moderation with your meals and snacks Consider reading food labels for Total Carbohydrate and Fat Grams of foods Consider checking BG at alternate times per day as directed by MD (2-3 TIMES PER DAY) CONTINUE taking medication as directed by MD  i'M VERY EXCITED YOUR GOING TO [place]  Stay away from regular soda, stick to the Diet Stay away from sweet tea, use splenda  TRY TO KEEP TEMPTATION FOODS OUT OF THE HOUSE!  Have your potatoes but only about 1/2 C per meal Have small snacks between meals

## 2015-02-11 NOTE — Progress Notes (Signed)
Diabetes Self-Management Education  Visit Type:   DSME follow up  Appt. Start Time: 1100 Appt. End Time: 1200  02/11/2015  [name], identified by name and date of birth, is a 69 y.o. female with a diagnosis of Diabetes:  .  Other people present during visit:  Patient . Valerie [name] returns with continued struggles with her weight and glucose. She indicated that her PCP discussed Bariatric Surgery with her and she will be attending an information session. She also struggles with her dietary intake. She has young family members living with her that have poor choice foods in the house. This causes great temptation. She struggles with the will power to refrain from some of these poor choices and moderation.  ASSESSMENT  Height 5\' 6"  (1.676 m), weight 276 lb 9.6 oz (125.465 kg). Body mass index is 44.67 kg/(m^2).  Psychosocial:   Self-management support: Doctor's office, CDE visits Other persons present: Patient Patient Concerns: Nutrition/Meal planning, Glycemic Control, Weight Control Special Needs: None Preferred Learning Style: No preference indicated Learning Readiness: Change in progress  Complications:   Last HgB A1C per patient/outside source: 8.3 mg/dL How often do you check your blood sugar?: 3-4 times/day Fasting Blood glucose range (mg/dL): 130-179  Exercise:  Exercise: ADL's  Individualized Plan for Diabetes Self-Management Training:   Learning Objective:  Patient will have a greater understanding of diabetes self-management. Patient education plan per assessed needs and concerns is to attend individual sessions     PATIENTS GOALS/Plan (Developed by the patient):  Nutrition: General guidelines for healthy choices and portions discussed   Patient Instructions  Plan:  Aim for 2 Carb Choices per meal (30 grams) +/- 1 either way  Aim for 0-15 Carbs per snack if hungry  Include protein in moderation with your meals and snacks Consider reading food labels for  Total Carbohydrate and Fat Grams of foods Consider checking BG at alternate times per day as directed by MD (2-3 TIMES PER DAY) CONTINUE taking medication as directed by MD  i'M VERY EXCITED YOUR GOING TO [place]  Stay away from regular soda, stick to the Diet Stay away from sweet tea, use splenda  TRY TO KEEP TEMPTATION FOODS OUT OF THE HOUSE!  Have your potatoes but only about 1/2 C per meal Have small snacks between meals  If problems or questions, patient to contact team via:  Phone  Future DSME appointment: PRN

## 2015-04-03 ENCOUNTER — Ambulatory Visit: Payer: Medicare Other | Admitting: Internal Medicine

## 2015-04-10 ENCOUNTER — Ambulatory Visit (INDEPENDENT_AMBULATORY_CARE_PROVIDER_SITE_OTHER): Payer: Medicare Other | Admitting: Internal Medicine

## 2015-04-10 ENCOUNTER — Encounter: Payer: Self-pay | Admitting: Internal Medicine

## 2015-04-10 ENCOUNTER — Other Ambulatory Visit (INDEPENDENT_AMBULATORY_CARE_PROVIDER_SITE_OTHER): Payer: Medicare Other

## 2015-04-10 VITALS — BP 122/76 | HR 85 | Temp 98.6°F | Resp 16 | Ht 66.0 in | Wt 282.0 lb

## 2015-04-10 DIAGNOSIS — M4806 Spinal stenosis, lumbar region: Secondary | ICD-10-CM

## 2015-04-10 DIAGNOSIS — M48061 Spinal stenosis, lumbar region without neurogenic claudication: Secondary | ICD-10-CM

## 2015-04-10 DIAGNOSIS — E038 Other specified hypothyroidism: Secondary | ICD-10-CM

## 2015-04-10 DIAGNOSIS — I10 Essential (primary) hypertension: Secondary | ICD-10-CM | POA: Diagnosis not present

## 2015-04-10 DIAGNOSIS — E118 Type 2 diabetes mellitus with unspecified complications: Secondary | ICD-10-CM | POA: Diagnosis not present

## 2015-04-10 LAB — BASIC METABOLIC PANEL
BUN: 15 mg/dL (ref 6–23)
CO2: 31 mEq/L (ref 19–32)
Calcium: 9.8 mg/dL (ref 8.4–10.5)
Chloride: 100 mEq/L (ref 96–112)
Creatinine, Ser: 1.02 mg/dL (ref 0.40–1.20)
GFR: 69.5 mL/min (ref >60.00)
Glucose, Bld: 129 mg/dL — ABNORMAL HIGH (ref 70–99)
Potassium: 4.1 mEq/L (ref 3.5–5.1)
Sodium: 138 mEq/L (ref 135–145)

## 2015-04-10 LAB — TSH: TSH: 3.75 u[IU]/mL (ref 0.35–4.50)

## 2015-04-10 LAB — HEMOGLOBIN A1C: Hgb A1c MFr Bld: 7.4 % — ABNORMAL HIGH (ref 4.6–6.5)

## 2015-04-10 MED ORDER — OXYCODONE-ACETAMINOPHEN 5-325 MG PO TABS: 1.0000 | ORAL_TABLET | ORAL | Status: DC | PRN

## 2015-04-10 NOTE — Patient Instructions (Signed)

## 2015-04-10 NOTE — Progress Notes (Signed)
Subjective:    Patient ID: Valerie [name], female    DOB: 31-May-1946, 69 y.o.   MRN: [id number]  Diabetes She presents for her follow-up diabetic visit. She has type 2 diabetes mellitus. Her disease course has been stable. There are no hypoglycemic associated symptoms. Pertinent negatives for diabetes include no blurred vision, no chest pain, no fatigue, no foot paresthesias, no foot ulcerations, no polydipsia, no polyphagia, no polyuria, no visual change, no weakness and no weight loss. There are no hypoglycemic complications. There are no diabetic complications. Current diabetic treatment includes oral agent (dual therapy) and insulin injections. She is compliant with treatment most of the time. Her weight is increasing steadily. She is following a generally unhealthy diet. When asked about meal planning, she reported none. She never participates in exercise. There is no change in her home blood glucose trend. She does not see a podiatrist.Eye exam is current.      Review of Systems  Constitutional: Negative.  Negative for chills, weight loss, diaphoresis, appetite change and fatigue.  HENT: Negative.   Eyes: Negative.  Negative for blurred vision.  Respiratory: Negative.  Negative for cough, choking, chest tightness, shortness of breath and stridor.   Cardiovascular: Negative.  Negative for chest pain, palpitations and leg swelling.  Gastrointestinal: Negative.  Negative for nausea, vomiting, abdominal pain, diarrhea, constipation and blood in stool.  Endocrine: Negative.  Negative for polydipsia, polyphagia and polyuria.  Genitourinary: Negative.   Musculoskeletal: Positive for back pain and arthralgias. Negative for myalgias, joint swelling and neck pain.  Skin: Negative.  Negative for rash.  Allergic/Immunologic: Negative.   Neurological: Negative.  Negative for weakness.  Hematological: Negative.  Negative for adenopathy. Does not bruise/bleed easily.  Psychiatric/Behavioral:  Negative.        Objective:   Physical Exam  Constitutional: She is oriented to person, place, and time. She appears well-developed and well-nourished. No distress.  HENT:  Head: Normocephalic and atraumatic.  Mouth/Throat: Oropharynx is clear and moist. No oropharyngeal exudate.  Eyes: Conjunctivae are normal. Right eye exhibits no discharge. Left eye exhibits no discharge. No scleral icterus.  Neck: Normal range of motion. Neck supple. No JVD present. No tracheal deviation present. No thyromegaly present.  Cardiovascular: Normal rate, regular rhythm, normal heart sounds and intact distal pulses.  Exam reveals no gallop and no friction rub.   No murmur heard. Pulmonary/Chest: Effort normal and breath sounds normal. No stridor. No respiratory distress. She has no wheezes. She has no rales. She exhibits no tenderness.  Abdominal: Soft. Bowel sounds are normal. She exhibits no distension and no mass. There is no tenderness. There is no rebound and no guarding.  Musculoskeletal: Normal range of motion. She exhibits no edema or tenderness.  Lymphadenopathy:    She has no cervical adenopathy.  Neurological: She is oriented to person, place, and time.  Skin: Skin is warm and dry. No rash noted. She is not diaphoretic. No erythema. No pallor.  Vitals reviewed.    Lab Results  Component Value Date   WBC 10.6* 11/29/2014   HGB 11.3* 11/29/2014   HCT 34.6* 11/29/2014   PLT 326.0 11/29/2014   GLUCOSE 211* 11/29/2014   CHOL 155 11/29/2014   TRIG 261.0* 11/29/2014   HDL 40.30 11/29/2014   LDLDIRECT 71.2 11/29/2014   LDLCALC 43 03/28/2014   ALT 32 07/17/2013   AST 32 07/17/2013   NA 138 11/29/2014   K 4.1 11/29/2014   CL 102 11/29/2014   CREATININE 0.9 11/29/2014  BUN 17 11/29/2014   CO2 26 11/29/2014   TSH 4.23 11/29/2014   INR 0.97 07/12/2010   HGBA1C 8.3* 11/29/2014   MICROALBUR 2.7* 11/29/2014       Assessment & Plan:

## 2015-04-11 NOTE — Assessment & Plan Note (Signed)
Her TSh is in the normal range Will stay on the current synthroid dose 

## 2015-04-11 NOTE — Assessment & Plan Note (Signed)
Her A1C shows improved blood sugars Will cont he current regimen She agrees to work on her lifestyle modifications

## 2015-04-11 NOTE — Assessment & Plan Note (Signed)
Her BP is well controlled Lytes and renal function are stable 

## 2015-04-11 NOTE — Assessment & Plan Note (Signed)
She will cont taking percocet as needed for pain

## 2015-04-16 ENCOUNTER — Telehealth: Payer: Self-pay

## 2015-04-16 NOTE — Telephone Encounter (Signed)
Call to introduce AWV and agreed to come in May 16th at at 11am.

## 2015-04-29 ENCOUNTER — Ambulatory Visit (INDEPENDENT_AMBULATORY_CARE_PROVIDER_SITE_OTHER): Payer: Medicare Other

## 2015-04-29 VITALS — BP 132/70 | Ht 66.0 in | Wt 280.5 lb

## 2015-04-29 DIAGNOSIS — Z0000 Encounter for general adult medical examination without abnormal findings: Secondary | ICD-10-CM

## 2015-04-29 NOTE — Patient Instructions (Addendum)
Ms. Dejarnette , Thank you for taking time to come for your Medicare Wellness Visit. I appreciate your ongoing commitment to your health goals. Please review the following plan we discussed and let me know if I can assist you in the future.   These are the goals we discussed: Goals    . Attending meditation / other stress management classes     [facility]; will try the pool  Active Prayer life; States she can work on this/ Will go to prayer service at [employer] to sister Would like to get back into walking/ may try pool   Apt with Dr. Ronnald Ramp to discuss more affordable meds for depression if she continues to feel bad or mood gets more depressed Is functioning at present  Wellness nurse to put together some resources for disabled son       This is a list of the screening recommended for you and due dates:  Health Maintenance  Topic Date Due  . Flu Shot  07/15/2015  . Hemoglobin A1C  10/10/2015  . Complete foot exam   11/30/2015  . Urine Protein Check  11/30/2015  . Eye exam for diabetics  12/01/2015  . Mammogram  04/18/2016  . Colon Cancer Screening  02/22/2017  . Pneumonia vaccines (2 of 2 - PPSV23) 01/20/2018  . Tetanus Vaccine  02/06/2020  . DEXA scan (bone density measurement)  Completed  . Shingles Vaccine  Addressed    Stress Stress-related medical problems are becoming increasingly common. The body has a built-in physical response to stressful situations. Faced with pressure, challenge or danger, we need to react quickly. Our bodies release hormones such as cortisol and adrenaline to help do this. These hormones are part of the "fight or flight" response and affect the metabolic rate, heart rate and blood pressure, resulting in a heightened, stressed state that prepares the body for optimum performance in dealing with a stressful situation. It is likely that early man required these mechanisms to stay alive, but usually modern stresses do not call for  this, and the same hormones released in today's world can damage health and reduce coping ability. CAUSES  Pressure to perform at work, at school or in sports.  Threats of physical violence.  Money worries.  Arguments.  Family conflicts.  Divorce or separation from significant other.  Bereavement.  New job or unemployment.  Changes in location.  Alcohol or drug abuse. SOMETIMES, THERE IS NO PARTICULAR REASON FOR DEVELOPING STRESS. Almost all people are at risk of being stressed at some time in their lives. It is important to know that some stress is temporary and some is long term.  Temporary stress will go away when a situation is resolved. Most people can cope with short periods of stress, and it can often be relieved by relaxing, taking a walk or getting any type of exercise, chatting through issues with friends, or having a good night's sleep.  Chronic (long-term, continuous) stress is much harder to deal with. It can be psychologically and emotionally damaging. It can be harmful both for an individual and for friends and family. SYMPTOMS Everyone reacts to stress differently. There are some common effects that help [place] recognize it. In times of extreme stress, people may:  Shake uncontrollably.  Breathe faster and deeper than normal (hyperventilate).  Vomit.  For people with asthma, stress can trigger an attack.  For some people, stress may trigger migraine headaches, ulcers, and body pain. PHYSICAL EFFECTS  OF STRESS MAY INCLUDE:  Loss of energy.  Skin problems.  Aches and pains resulting from tense muscles, including neck ache, backache and tension headaches.  Increased pain from arthritis and other conditions.  Irregular heart beat (palpitations).  Periods of irritability or anger.  Apathy or depression.  Anxiety (feeling uptight or worrying).  Unusual behavior.  Loss of appetite.  Comfort eating.  Lack of concentration.  Loss of, or decreased,  sex-drive.  Increased smoking, drinking, or recreational drug use.  For women, missed periods.  Ulcers, joint pain, and muscle pain. Post-traumatic stress is the stress caused by any serious accident, strong emotional damage, or extremely difficult or violent experience such as rape or war. Post-traumatic stress victims can experience mixtures of emotions such as fear, shame, depression, guilt or anger. It may include recurrent memories or images that may be haunting. These feelings can last for weeks, months or even years after the traumatic event that triggered them. Specialized treatment, possibly with medicines and psychological therapies, is available. If stress is causing physical symptoms, severe distress or making it difficult for you to function as normal, it is worth seeing your caregiver. It is important to remember that although stress is a usual part of life, extreme or prolonged stress can lead to other illnesses that will need treatment. It is better to visit a doctor sooner rather than later. Stress has been linked to the development of high blood pressure and heart disease, as well as insomnia and depression. There is no diagnostic test for stress since everyone reacts to it differently. But a caregiver will be able to spot the physical symptoms, such as:  Headaches.  Shingles.  Ulcers. Emotional distress such as intense worry, low mood or irritability should be detected when the doctor asks pertinent questions to identify any underlying problems that might be the cause. In case there are physical reasons for the symptoms, the doctor may also want to do some tests to exclude certain conditions. If you feel that you are suffering from stress, try to identify the aspects of your life that are causing it. Sometimes you may not be able to change or avoid them, but even a small change can have a positive ripple effect. A simple lifestyle change can make all the difference. STRATEGIES  THAT CAN HELP DEAL WITH STRESS:  Delegating or sharing responsibilities.  Avoiding confrontations.  Learning to be more assertive.  Regular exercise.  Avoid using alcohol or street drugs to cope.  Eating a healthy, balanced diet, rich in fruit and vegetables and proteins.  Finding humor or absurdity in stressful situations.  Never taking on more than you know you can handle comfortably.  Organizing your time better to get as much done as possible.  Talking to friends or family and sharing your thoughts and fears.  Listening to music or relaxation tapes.  Relaxation techniques like deep breathing, meditation, and yoga.  Tensing and then relaxing your muscles, starting at the toes and working up to the head and neck. If you think that you would benefit from help, either in identifying the things that are causing your stress or in learning techniques to help you relax, see a caregiver who is capable of helping you with this. Rather than relying on medications, it is usually better to try and identify the things in your life that are causing stress and try to deal with them. There are many techniques of managing stress including counseling, psychotherapy, aromatherapy, yoga, and exercise. Your caregiver can  help you determine what is best for you. Document Released: 02/20/2003 Document Revised: 12/05/2013 Document Reviewed: 01/17/2008 [facility] Patient Information 2015 [place], [place]. This information is not intended to replace advice given to you by your health care provider. Make sure you discuss any questions you have with your health care provider.  Health Maintenance Adopting a healthy lifestyle and getting preventive care can go a long way to promote health and wellness. Talk with your health care provider about what schedule of regular examinations is right for you. This is a good chance for you to check in with your provider about disease prevention and staying healthy. In  between checkups, there are plenty of things you can do on your own. Experts have done a lot of research about which lifestyle changes and preventive measures are most likely to keep you healthy. Ask your health care provider for more information. WEIGHT AND DIET  Eat a healthy diet  Be sure to include plenty of vegetables, fruits, low-fat dairy products, and lean protein.  Do not eat a lot of foods high in solid fats, added sugars, or salt.  Get regular exercise. This is one of the most important things you can do for your health.  Most adults should exercise for at least 150 minutes each week. The exercise should increase your heart rate and make you sweat (moderate-intensity exercise).  Most adults should also do strengthening exercises at least twice a week. This is in addition to the moderate-intensity exercise.  Maintain a healthy weight  Body mass index (BMI) is a measurement that can be used to identify possible weight problems. It estimates body fat based on height and weight. Your health care provider can help determine your BMI and help you achieve or maintain a healthy weight.  For females 8 years of age and older:   A BMI below 18.5 is considered underweight.  A BMI of 18.5 to 24.9 is normal.  A BMI of 25 to 29.9 is considered overweight.  A BMI of 30 and above is considered obese.  Watch levels of cholesterol and blood lipids  You should start having your blood tested for lipids and cholesterol at 67 years of age, then have this test every 5 years.  You may need to have your cholesterol levels checked more often if:  Your lipid or cholesterol levels are high.  You are older than 67 years of age.  You are at high risk for heart disease.  CANCER SCREENING   Lung Cancer  Lung cancer screening is recommended for adults 51-58 years old who are at high risk for lung cancer because of a history of smoking.  A yearly low-dose CT scan of the lungs is recommended  for people who:  Currently smoke.  Have quit within the past 15 years.  Have at least a 30-pack-year history of smoking. A pack year is smoking an average of one pack of cigarettes a day for 1 year.  Yearly screening should continue until it has been 15 years since you quit.  Yearly screening should stop if you develop a health problem that would prevent you from having lung cancer treatment.  Breast Cancer  Practice breast self-awareness. This means understanding how your breasts normally appear and feel.  It also means doing regular breast self-exams. Let your health care provider know about any changes, no matter how small.  If you are in your 20s or 30s, you should have a clinical breast exam (CBE) by a health care provider  every 1-3 years as part of a regular health exam.  If you are 66 or older, have a CBE every year. Also consider having a breast X-ray (mammogram) every year.  If you have a family history of breast cancer, talk to your health care provider about genetic screening.  If you are at high risk for breast cancer, talk to your health care provider about having an MRI and a mammogram every year.  Breast cancer gene (BRCA) assessment is recommended for women who have family members with BRCA-related cancers. BRCA-related cancers include:  Breast.  Ovarian.  Tubal.  Peritoneal cancers.  Results of the assessment will determine the need for genetic counseling and BRCA1 and BRCA2 testing. Cervical Cancer Routine pelvic examinations to screen for cervical cancer are no longer recommended for nonpregnant women who are considered low risk for cancer of the pelvic organs (ovaries, uterus, and vagina) and who do not have symptoms. A pelvic examination may be necessary if you have symptoms including those associated with pelvic infections. Ask your health care provider if a screening pelvic exam is right for you.   The Pap test is the screening test for cervical cancer  for women who are considered at risk.  If you had a hysterectomy for a problem that was not cancer or a condition that could lead to cancer, then you no longer need Pap tests.  If you are older than 65 years, and you have had normal Pap tests for the past 10 years, you no longer need to have Pap tests.  If you have had past treatment for cervical cancer or a condition that could lead to cancer, you need Pap tests and screening for cancer for at least 20 years after your treatment.  If you no longer get a Pap test, assess your risk factors if they change (such as having a new sexual partner). This can affect whether you should start being screened again.  Some women have medical problems that increase their chance of getting cervical cancer. If this is the case for you, your health care provider may recommend more frequent screening and Pap tests.  The human papillomavirus (HPV) test is another test that may be used for cervical cancer screening. The HPV test looks for the virus that can cause cell changes in the cervix. The cells collected during the Pap test can be tested for HPV.  The HPV test can be used to screen women 63 years of age and older. Getting tested for HPV can extend the interval between normal Pap tests from three to five years.  An HPV test also should be used to screen women of any age who have unclear Pap test results.  After 67 years of age, women should have HPV testing as often as Pap tests.  Colorectal Cancer  This type of cancer can be detected and often prevented.  Routine colorectal cancer screening usually begins at 67 years of age and continues through 67 years of age.  Your health care provider may recommend screening at an earlier age if you have risk factors for colon cancer.  Your health care provider may also recommend using home test kits to check for hidden blood in the stool.  A small camera at the end of a tube can be used to examine your colon  directly (sigmoidoscopy or colonoscopy). This is done to check for the earliest forms of colorectal cancer.  Routine screening usually begins at age 34.  Direct examination of the colon should  be repeated every 5-10 years through 67 years of age. However, you may need to be screened more often if early forms of precancerous polyps or small growths are found. Skin Cancer  Check your skin from head to toe regularly.  Tell your health care provider about any new moles or changes in moles, especially if there is a change in a mole's shape or color.  Also tell your health care provider if you have a mole that is larger than the size of a pencil eraser.  Always use sunscreen. Apply sunscreen liberally and repeatedly throughout the day.  Protect yourself by wearing long sleeves, pants, a wide-brimmed hat, and sunglasses whenever you are outside. HEART DISEASE, DIABETES, AND HIGH BLOOD PRESSURE   Have your blood pressure checked at least every 1-2 years. High blood pressure causes heart disease and increases the risk of stroke.  If you are between 54 years and 45 years old, ask your health care provider if you should take aspirin to prevent strokes.  Have regular diabetes screenings. This involves taking a blood sample to check your fasting blood sugar level.  If you are at a normal weight and have a low risk for diabetes, have this test once every three years after 67 years of age.  If you are overweight and have a high risk for diabetes, consider being tested at a younger age or more often. PREVENTING INFECTION  Hepatitis B  If you have a higher risk for hepatitis B, you should be screened for this virus. You are considered at high risk for hepatitis B if:  You were born in a country where hepatitis B is common. Ask your health care provider which countries are considered high risk.  Your parents were born in a high-risk country, and you have not been immunized against hepatitis B  (hepatitis B vaccine).  You have HIV or AIDS.  You use needles to inject street drugs.  You live with someone who has hepatitis B.  You have had sex with someone who has hepatitis B.  You get hemodialysis treatment.  You take certain medicines for conditions, including cancer, organ transplantation, and autoimmune conditions. Hepatitis C  Blood testing is recommended for:  Everyone born from [birth year] through 1963.  Anyone with known risk factors for hepatitis C. Sexually transmitted infections (STIs)  You should be screened for sexually transmitted infections (STIs) including gonorrhea and chlamydia if:  You are sexually active and are younger than 67 years of age.  You are older than 67 years of age and your health care provider tells you that you are at risk for this type of infection.  Your sexual activity has changed since you were last screened and you are at an increased risk for chlamydia or gonorrhea. Ask your health care provider if you are at risk.  If you do not have HIV, but are at risk, it may be recommended that you take a prescription medicine daily to prevent HIV infection. This is called pre-exposure prophylaxis (PrEP). You are considered at risk if:  You are sexually active and do not regularly use condoms or know the HIV status of your partner(s).  You take drugs by injection.  You are sexually active with a partner who has HIV. Talk with your health care provider about whether you are at high risk of being infected with HIV. If you choose to begin PrEP, you should first be tested for HIV. You should then be tested every 3 months for as long  as you are taking PrEP.  PREGNANCY   If you are premenopausal and you may become pregnant, ask your health care provider about preconception counseling.  If you may become pregnant, take 400 to 800 micrograms (mcg) of folic acid every day.  If you want to prevent pregnancy, talk to your health care provider about birth  control (contraception). OSTEOPOROSIS AND MENOPAUSE   Osteoporosis is a disease in which the bones lose minerals and strength with aging. This can result in serious bone fractures. Your risk for osteoporosis can be identified using a bone density scan.  If you are 103 years of age or older, or if you are at risk for osteoporosis and fractures, ask your health care provider if you should be screened.  Ask your health care provider whether you should take a calcium or vitamin D supplement to lower your risk for osteoporosis.  Menopause may have certain physical symptoms and risks.  Hormone replacement therapy may reduce some of these symptoms and risks. Talk to your health care provider about whether hormone replacement therapy is right for you.  HOME CARE INSTRUCTIONS   Schedule regular health, dental, and eye exams.  Stay current with your immunizations.   Do not use any tobacco products including cigarettes, chewing tobacco, or electronic cigarettes.  If you are pregnant, do not drink alcohol.  If you are breastfeeding, limit how much and how often you drink alcohol.  Limit alcohol intake to no more than 1 drink per day for nonpregnant women. One drink equals 12 ounces of beer, 5 ounces of wine, or 1 ounces of hard liquor.  Do not use street drugs.  Do not share needles.  Ask your health care provider for help if you need support or information about quitting drugs.  Tell your health care provider if you often feel depressed.  Tell your health care provider if you have ever been abused or do not feel safe at home. Document Released: 06/15/2011 Document Revised: 04/16/2014 Document Reviewed: 11/01/2013 [facility] Patient Information 2015 [place], [place]. This information is not intended to replace advice given to you by your health care provider. Make sure you discuss any questions you have with your health care provider.

## 2015-04-29 NOTE — Progress Notes (Signed)
Subjective:   Valerie [name] is a 69 y.o. female who presents for Medicare Annual (Subsequent) preventive examination.  Review of Systems:     HRA assessment completed during visit  Health better, the same or worse than last year? The same  Health described as excellent; very good; good; fair or poor? Wish better States better would be getting BS under control and lose weight and feel this would help the pain   Barriers; husband disabled / on dialysis; 3 x per week; Spouse has bad knees;  Took him to the dentist this am; cares for son   Current Exercise; Y: New for patient; taking classes; going 1 time; trying to go 2 to 3 times Recommended pool as fitness trainer did as well (Goal)   Current dietary; Good and bad; scale of 1-10; she is a 5; Does good 50% of the time; taking medicine Did not discuss diet due to coaching for stress management; Discussed "wheel of life" and balancing chronic disease with caregiver role   Has handicapped son (13) quad; speech; 24 hours care He has a twin and older brother; Not much support  Stresses 1 -10; 20  Stress; fatigued when stressed; does not "bounce back"  Depressed some; can't afford to take medicine that Dr. Ronnald Ramp gave her  Going to psychiatrist; many years ago but did not help Discussed counseling; Has tried and did not work States she has been on anti-depressants but can't afford them;  What would help? States she can stop doing things for others all the time Discussed counseling and gave her brochure on [employer] Encouraged her to get apt with Dr. Ronnald Ramp; Stated she would if she did not feel better Mood improved prior to leaving; Agreed to fup on self activities as pool exercise; prayer time and saying NO when necessary; Will seek counseling if necessary. Stated she did not feel like she would hurt herself; "I am not that bad".   Vision checks eyes bi annually; checked in Dec  Dental: Seen Dentist in  Dec. 1015  Generalized Safety in the home reviewed  Gave information on safety to take home; time limited for lengthy discussion today   Current Care Team reviewed and updated         Objective:     Vitals: BP 144/79 mmHg  Ht 5\' 6"  (1.676 m)  Wt 280 lb 8 oz (127.234 kg)  BMI 45.30 kg/m2  Tobacco History  Smoking status  . Never Smoker   Smokeless tobacco  . Never Used     Counseling given: Not Answered   Past Medical History  Diagnosis Date  . Anemia   . Depression   . Diabetes mellitus, type 2   . GERD (gastroesophageal reflux disease)   . Hyperlipidemia   . Hypertension   . Sleep apnea   . Asthma   . Hemorrhoids   . Fatigue   . Snoring disorder   . Memory deficit 10/04/2013  . Obesity   . Anxiety and depression   . Glaucoma   . Degenerative arthritis   . Hypothyroid   . Hypoxemia 11/24/2013   Past Surgical History  Procedure Laterality Date  . Benign tumors resected    . Tubal ligation    . Rotator cuff repair    . Cataract extraction Left   . Refractive surgery      Glaucoma  . Foot surgery Left     bone spur   Family History  Problem Relation Age  of Onset  . Hypertension      family history  . Alcohol abuse    . Alcohol abuse Mother   . Heart attack Mother   . Coronary artery disease Brother   . Heart attack Father   . Heart disease Sister   . Atrial fibrillation Sister   . Hypertension Sister    History  Sexual Activity  . Sexual Activity: Not Currently    Outpatient Encounter Prescriptions as of 04/29/2015  Medication Sig  . albuterol (VENTOLIN HFA) 108 (90 BASE) MCG/ACT inhaler Inhale 2 puffs into the lungs every 6 (six) hours as needed. For shortness of breath  . amLODipine (NORVASC) 10 MG tablet Take 1 tablet (10 mg total) by mouth daily.  [name] aspirin 81 MG tablet Take 81 mg by mouth daily.  . carvedilol (COREG) 25 MG tablet TAKE ONE TABLET BY MOUTH TWICE DAILY WITH MEALS  . Doxepin HCl (SILENOR) 6 MG TABS Take 1 tablet (6  mg total) by mouth at bedtime as needed.  . gabapentin (NEURONTIN) 300 MG capsule TAKE ONE CAPSULE BY MOUTH THREE TIMES DAILY  . glucose blood (FREESTYLE LITE) test strip Test up to three times daily dx: 250.42  . Insulin Glargine (TOUJEO SOLOSTAR) 300 UNIT/ML SOPN Inject 30 Units into the skin daily.  . Insulin Pen Needle 31G X 5 MM MISC Use once daily with insulin  . latanoprost (XALATAN) 0.005 % ophthalmic solution Place 1 drop into both eyes at bedtime.  [name] losartan-hydrochlorothiazide (HYZAAR) 100-12.5 MG per tablet Take 1 tablet by mouth daily.  [name] omeprazole (PRILOSEC) 40 MG capsule Take 1 capsule (40 mg total) by mouth daily.  [name] oxyCODONE-acetaminophen (PERCOCET) 5-325 MG per tablet Take 1 tablet by mouth every 4 (four) hours as needed.  . rosuvastatin (CRESTOR) 10 MG tablet Take 1 tablet (10 mg total) by mouth daily.  . SitaGLIPtin-MetFORMIN HCl (JANUMET XR) [phone] MG TB24 Take 1 tablet by mouth daily.   No facility-administered encounter medications on file as of 04/29/2015.    Activities of Daily Living In your present state of health, do you have any difficulty performing the following activities: 04/29/2015  Hearing? N  Vision? N  Difficulty concentrating or making decisions? N  Walking or climbing stairs? N  Dressing or bathing? N  Doing errands, shopping? N  Preparing Food and eating ? N  Using the Toilet? N  In the past six months, have you accidently leaked urine? N  Do you have problems with loss of bowel control? N  Managing your Medications? N  Managing your Finances? N  Housekeeping or managing your Housekeeping? N    Patient Care Team: Janith Lima, MD as PCP - General    Assessment:    Objective:    Understand risk and lifestyle choices but focused on mental health  Personalized Education given regarding: exercise and caring for self  Pt determined a personalized goal  Assessment included: smoking (secondary) educated as appropriate; including  LDCT if appropriate (non smoker)  Taking meds without issues; no barriers identified Stress: Recommendations for managing stress if assessed as a factor;  No Risk for hepatitis or high risk social behavior identified at this time   Safety issues reviewed(driving issues; vision; home environment; support and environmental safety)  Cognition assessed by AD8; Score 1 to 2, but associates this caregiver role (A score of 2 or greater would indicate the MMSE be completed)    Need for Immunizations or other screenings identified;  (CDC recommmend Prevnar at  65 followed by pnuemovax 23 in one year or 5 years after the last dose.  Health Maintenance up to date and a Preventive Wellness Plan was given to the patient    Exercise Activities and Dietary recommendations    Goals    None     Fall Risk Fall Risk  04/29/2015 02/06/2015 01/30/2015 09/01/2013 07/18/2013  Falls in the past year? No No No No No   Depression Screen PHQ 2/9 Scores 04/29/2015 02/06/2015 01/30/2015 09/03/2013  PHQ - 2 Score 3 0 0 2  PHQ- 9 Score 9 - - 9     Cognitive Testing MMSE - Mini Mental State Exam 04/29/2015  Not completed: Unable to complete    Immunization History  Administered Date(s) Administered  . Influenza Split 09/21/2011, 08/19/2012  . Influenza Whole 09/25/2009, 09/04/2010  . Influenza,inj,Quad PF,36+ Mos 09/01/2013, 11/29/2014  . Pneumococcal Conjugate-13 07/13/2014  . Pneumococcal Polysaccharide-23 01/20/2013  . Td 02/05/2010   Screening Tests Health Maintenance  Topic Date Due  . INFLUENZA VACCINE  07/15/2015  . HEMOGLOBIN A1C  10/10/2015  . FOOT EXAM  11/30/2015  . URINE MICROALBUMIN  11/30/2015  . OPHTHALMOLOGY EXAM  12/01/2015  . MAMMOGRAM  04/18/2016  . COLONOSCOPY  02/22/2017  . PNA vac Low Risk Adult (2 of 2 - PPSV23) 01/20/2018  . TETANUS/TDAP  02/06/2020  . DEXA SCAN  Completed  . ZOSTAVAX  Addressed      Plan:  Plan   The patient agrees KN:LZJQ Dr. Ronnald Ramp if her mood  becomes more blue and sad; or if she continues to feel fatigued Educated that not all anti-depressants are expensive Educated regarding seeking treatment prior to "getting worse".  Patient information given on stress;    Advanced directive: Educated but will will hold for now  Discuss with Doctor on next fup: fup on mood Taking insulin without issues at present; A1c down;  Gave information regarding web site; [employer] and stated family would assist her with using this tool Will look up resources for son and mail out  Commit to Goals set; [employer] and pool exercise x 2 q week   During the course of the visit the patient was educated and counseled about the following appropriate screening and preventive services:   Vaccines to include Pneumoccal, Influenza, Hepatitis B, Td, Zostavax, HCV  Electrocardiogram  Cardiovascular Disease  Colorectal cancer screening  Bone density screening  Diabetes screening  Glaucoma screening  Mammography/PAP  Nutrition counseling   Not overdue at present  Patient Instructions (the written plan) was given to the patient.   Regarding Stress reduction; Wheel of Life to promote balance Diabetes management;  Wynetta Fines, RN  04/29/2015     Medical screening examination/treatment/procedure(s) were performed by non-physician practitioner and as supervising physician I was immediately available for consultation/collaboration. I agree with above. [name], MD

## 2015-07-26 ENCOUNTER — Other Ambulatory Visit: Payer: Self-pay | Admitting: Internal Medicine

## 2015-08-27 ENCOUNTER — Telehealth: Payer: Self-pay | Admitting: Internal Medicine

## 2015-08-27 NOTE — Telephone Encounter (Signed)
LMOVM returning pt call.

## 2015-08-27 NOTE — Telephone Encounter (Signed)
Pt states she is in the donut holes with the Janumet and Toujeo. Wants to know if there are samples or alternatives? PT will be in office next Wednesday for OV. Please Advise.

## 2015-08-27 NOTE — Telephone Encounter (Signed)
Pt called in said that she can not afford meds because she is in dounut hole.  She is requesting a call from nurse to see what she needs to do?    Best number [phone]

## 2015-08-28 ENCOUNTER — Encounter (HOSPITAL_COMMUNITY): Payer: Self-pay | Admitting: *Deleted

## 2015-08-28 DIAGNOSIS — E785 Hyperlipidemia, unspecified: Secondary | ICD-10-CM | POA: Insufficient documentation

## 2015-08-28 DIAGNOSIS — I10 Essential (primary) hypertension: Secondary | ICD-10-CM | POA: Diagnosis not present

## 2015-08-28 DIAGNOSIS — E119 Type 2 diabetes mellitus without complications: Secondary | ICD-10-CM | POA: Diagnosis not present

## 2015-08-28 DIAGNOSIS — R0789 Other chest pain: Secondary | ICD-10-CM | POA: Diagnosis not present

## 2015-08-28 DIAGNOSIS — G4730 Sleep apnea, unspecified: Secondary | ICD-10-CM | POA: Insufficient documentation

## 2015-08-28 DIAGNOSIS — Z9102 Food additives allergy status: Secondary | ICD-10-CM | POA: Diagnosis not present

## 2015-08-28 DIAGNOSIS — M1990 Unspecified osteoarthritis, unspecified site: Secondary | ICD-10-CM | POA: Insufficient documentation

## 2015-08-28 DIAGNOSIS — K219 Gastro-esophageal reflux disease without esophagitis: Secondary | ICD-10-CM | POA: Insufficient documentation

## 2015-08-28 DIAGNOSIS — Z7982 Long term (current) use of aspirin: Secondary | ICD-10-CM | POA: Insufficient documentation

## 2015-08-28 DIAGNOSIS — D509 Iron deficiency anemia, unspecified: Secondary | ICD-10-CM | POA: Insufficient documentation

## 2015-08-28 DIAGNOSIS — F418 Other specified anxiety disorders: Secondary | ICD-10-CM | POA: Diagnosis not present

## 2015-08-28 DIAGNOSIS — J45909 Unspecified asthma, uncomplicated: Secondary | ICD-10-CM | POA: Insufficient documentation

## 2015-08-28 DIAGNOSIS — Z6841 Body Mass Index (BMI) 40.0 and over, adult: Secondary | ICD-10-CM | POA: Insufficient documentation

## 2015-08-28 DIAGNOSIS — Z880 Allergy status to penicillin: Secondary | ICD-10-CM | POA: Insufficient documentation

## 2015-08-28 DIAGNOSIS — Z23 Encounter for immunization: Secondary | ICD-10-CM | POA: Insufficient documentation

## 2015-08-28 DIAGNOSIS — Z794 Long term (current) use of insulin: Secondary | ICD-10-CM | POA: Diagnosis not present

## 2015-08-28 DIAGNOSIS — E039 Hypothyroidism, unspecified: Secondary | ICD-10-CM | POA: Diagnosis not present

## 2015-08-28 LAB — COMPREHENSIVE METABOLIC PANEL
ALT: 25 U/L (ref 14–54)
AST: 35 U/L (ref 15–41)
Albumin: 3.9 g/dL (ref 3.5–5.0)
Alkaline Phosphatase: 79 U/L (ref 38–126)
Anion gap: 10 (ref 5–15)
BUN: 11 mg/dL (ref 6–20)
CO2: 29 mmol/L (ref 22–32)
Calcium: 9.6 mg/dL (ref 8.9–10.3)
Chloride: 100 mmol/L — ABNORMAL LOW (ref 101–111)
Creatinine, Ser: 0.95 mg/dL (ref 0.44–1.00)
GFR calc Af Amer: >60 mL/min (ref >60)
GFR calc non Af Amer: >60 mL/min (ref >60)
Glucose, Bld: 217 mg/dL — ABNORMAL HIGH (ref 65–99)
Potassium: 4.3 mmol/L (ref 3.5–5.1)
Sodium: 139 mmol/L (ref 135–145)
Total Bilirubin: 0.5 mg/dL (ref 0.3–1.2)
Total Protein: 7.4 g/dL (ref 6.5–8.1)

## 2015-08-28 LAB — CBC WITH DIFFERENTIAL/PLATELET
Basophils Absolute: 0 10*3/uL (ref 0.0–0.1)
Basophils Relative: 0 %
Eosinophils Absolute: 0.3 10*3/uL (ref 0.0–0.7)
Eosinophils Relative: 2 %
HCT: 33.2 % — ABNORMAL LOW (ref 36.0–46.0)
Hemoglobin: 10.4 g/dL — ABNORMAL LOW (ref 12.0–15.0)
Lymphocytes Relative: 31 %
Lymphs Abs: 3.9 10*3/uL (ref 0.7–4.0)
MCH: 27.2 pg (ref 26.0–34.0)
MCHC: 31.3 g/dL (ref 30.0–36.0)
MCV: 86.9 fL (ref 78.0–100.0)
Monocytes Absolute: 1 10*3/uL (ref 0.1–1.0)
Monocytes Relative: 8 %
Neutro Abs: 7.5 10*3/uL (ref 1.7–7.7)
Neutrophils Relative %: 59 %
Platelets: 367 10*3/uL (ref 150–400)
RBC: 3.82 MIL/uL — ABNORMAL LOW (ref 3.87–5.11)
RDW: 15.6 % — ABNORMAL HIGH (ref 11.5–15.5)
WBC: 12.7 10*3/uL — ABNORMAL HIGH (ref 4.0–10.5)

## 2015-08-28 LAB — I-STAT TROPONIN, ED: Troponin i, poc: 0 ng/mL (ref 0.00–0.08)

## 2015-08-28 LAB — LIPASE, BLOOD: Lipase: 45 U/L (ref 22–51)

## 2015-08-28 NOTE — ED Notes (Signed)
Patient presents with c/o left chest stabbing pain that comes and goes.  States the pain started after eating a bolonga sandwich

## 2015-08-29 ENCOUNTER — Observation Stay (HOSPITAL_COMMUNITY): Payer: Medicare Other

## 2015-08-29 ENCOUNTER — Observation Stay (HOSPITAL_BASED_OUTPATIENT_CLINIC_OR_DEPARTMENT_OTHER): Payer: Medicare Other

## 2015-08-29 ENCOUNTER — Encounter (HOSPITAL_COMMUNITY): Payer: Self-pay | Admitting: *Deleted

## 2015-08-29 ENCOUNTER — Observation Stay (HOSPITAL_COMMUNITY)
Admission: EM | Admit: 2015-08-29 | Discharge: 2015-08-30 | Disposition: A | Discharge status: Home / Self Care | Payer: Medicare Other | Attending: Internal Medicine | Admitting: Internal Medicine

## 2015-08-29 DIAGNOSIS — G4730 Sleep apnea, unspecified: Secondary | ICD-10-CM | POA: Diagnosis present

## 2015-08-29 DIAGNOSIS — E785 Hyperlipidemia, unspecified: Secondary | ICD-10-CM | POA: Diagnosis not present

## 2015-08-29 DIAGNOSIS — R0789 Other chest pain: Secondary | ICD-10-CM | POA: Diagnosis present

## 2015-08-29 DIAGNOSIS — R079 Chest pain, unspecified: Secondary | ICD-10-CM

## 2015-08-29 DIAGNOSIS — R072 Precordial pain: Secondary | ICD-10-CM

## 2015-08-29 DIAGNOSIS — F418 Other specified anxiety disorders: Secondary | ICD-10-CM | POA: Diagnosis present

## 2015-08-29 DIAGNOSIS — E039 Hypothyroidism, unspecified: Secondary | ICD-10-CM | POA: Diagnosis present

## 2015-08-29 DIAGNOSIS — D519 Vitamin B12 deficiency anemia, unspecified: Secondary | ICD-10-CM | POA: Diagnosis present

## 2015-08-29 DIAGNOSIS — E118 Type 2 diabetes mellitus with unspecified complications: Secondary | ICD-10-CM | POA: Diagnosis present

## 2015-08-29 DIAGNOSIS — I10 Essential (primary) hypertension: Secondary | ICD-10-CM | POA: Diagnosis not present

## 2015-08-29 DIAGNOSIS — E038 Other specified hypothyroidism: Secondary | ICD-10-CM

## 2015-08-29 DIAGNOSIS — D509 Iron deficiency anemia, unspecified: Secondary | ICD-10-CM

## 2015-08-29 DIAGNOSIS — K219 Gastro-esophageal reflux disease without esophagitis: Secondary | ICD-10-CM

## 2015-08-29 DIAGNOSIS — E119 Type 2 diabetes mellitus without complications: Secondary | ICD-10-CM | POA: Diagnosis not present

## 2015-08-29 DIAGNOSIS — R791 Abnormal coagulation profile: Secondary | ICD-10-CM | POA: Diagnosis not present

## 2015-08-29 DIAGNOSIS — Z6841 Body Mass Index (BMI) 40.0 and over, adult: Secondary | ICD-10-CM

## 2015-08-29 DIAGNOSIS — I2699 Other pulmonary embolism without acute cor pulmonale: Secondary | ICD-10-CM

## 2015-08-29 LAB — COMPREHENSIVE METABOLIC PANEL
ALT: 27 U/L (ref 14–54)
AST: 33 U/L (ref 15–41)
Albumin: 4 g/dL (ref 3.5–5.0)
Alkaline Phosphatase: 78 U/L (ref 38–126)
Anion gap: 10 (ref 5–15)
BUN: 10 mg/dL (ref 6–20)
CO2: 28 mmol/L (ref 22–32)
Calcium: 9.7 mg/dL (ref 8.9–10.3)
Chloride: 102 mmol/L (ref 101–111)
Creatinine, Ser: 0.85 mg/dL (ref 0.44–1.00)
GFR calc Af Amer: >60 mL/min (ref >60)
GFR calc non Af Amer: >60 mL/min (ref >60)
Glucose, Bld: 162 mg/dL — ABNORMAL HIGH (ref 65–99)
Potassium: 3.8 mmol/L (ref 3.5–5.1)
Sodium: 140 mmol/L (ref 135–145)
Total Bilirubin: 0.5 mg/dL (ref 0.3–1.2)
Total Protein: 7.5 g/dL (ref 6.5–8.1)

## 2015-08-29 LAB — CBC WITH DIFFERENTIAL/PLATELET
Basophils Absolute: 0 10*3/uL (ref 0.0–0.1)
Basophils Relative: 0 %
Eosinophils Absolute: 0.2 10*3/uL (ref 0.0–0.7)
Eosinophils Relative: 2 %
HCT: 33.8 % — ABNORMAL LOW (ref 36.0–46.0)
Hemoglobin: 10.4 g/dL — ABNORMAL LOW (ref 12.0–15.0)
Lymphocytes Relative: 34 %
Lymphs Abs: 3.8 10*3/uL (ref 0.7–4.0)
MCH: 27 pg (ref 26.0–34.0)
MCHC: 30.8 g/dL (ref 30.0–36.0)
MCV: 87.8 fL (ref 78.0–100.0)
Monocytes Absolute: 0.9 10*3/uL (ref 0.1–1.0)
Monocytes Relative: 8 %
Neutro Abs: 6.2 10*3/uL (ref 1.7–7.7)
Neutrophils Relative %: 56 %
Platelets: 360 10*3/uL (ref 150–400)
RBC: 3.85 MIL/uL — ABNORMAL LOW (ref 3.87–5.11)
RDW: 15.6 % — ABNORMAL HIGH (ref 11.5–15.5)
WBC: 11.2 10*3/uL — ABNORMAL HIGH (ref 4.0–10.5)

## 2015-08-29 LAB — BRAIN NATRIURETIC PEPTIDE: B Natriuretic Peptide: 10.8 pg/mL (ref 0.0–100.0)

## 2015-08-29 LAB — PROTIME-INR
INR: 1.06 (ref 0.00–1.49)
Prothrombin Time: 14 seconds (ref 11.6–15.2)

## 2015-08-29 LAB — I-STAT TROPONIN, ED: Troponin i, poc: 0 ng/mL (ref 0.00–0.08)

## 2015-08-29 LAB — D-DIMER, QUANTITATIVE: D-Dimer, Quant: 0.53 ug/mL-FEU — ABNORMAL HIGH (ref 0.00–0.48)

## 2015-08-29 LAB — GLUCOSE, CAPILLARY
Glucose-Capillary: 152 mg/dL — ABNORMAL HIGH (ref 65–99)
Glucose-Capillary: 152 mg/dL — ABNORMAL HIGH (ref 65–99)
Glucose-Capillary: 206 mg/dL — ABNORMAL HIGH (ref 65–99)

## 2015-08-29 MED ORDER — CARVEDILOL 25 MG PO TABS
25.0000 mg | ORAL_TABLET | Freq: Two times a day (BID) | ORAL | Status: DC
  Administered 2015-08-29 – 2015-08-30 (×3): 25 mg via ORAL
  Filled 2015-08-29 (×3): qty 1

## 2015-08-29 MED ORDER — AMLODIPINE BESYLATE 10 MG PO TABS
10.0000 mg | ORAL_TABLET | Freq: Every day | ORAL | Status: DC
  Administered 2015-08-29 – 2015-08-30 (×2): 10 mg via ORAL
  Filled 2015-08-29 (×2): qty 1

## 2015-08-29 MED ORDER — ROSUVASTATIN CALCIUM 10 MG PO TABS
10.0000 mg | ORAL_TABLET | Freq: Every day | ORAL | Status: DC
  Administered 2015-08-29 – 2015-08-30 (×2): 10 mg via ORAL
  Filled 2015-08-29 (×2): qty 1

## 2015-08-29 MED ORDER — IOHEXOL 350 MG/ML SOLN
100.0000 mL | Freq: Once | INTRAVENOUS | Status: AC | PRN
  Administered 2015-08-29: 80 mL via INTRAVENOUS

## 2015-08-29 MED ORDER — ACETAMINOPHEN 325 MG PO TABS: 650.0000 mg | ORAL_TABLET | ORAL | Status: DC | PRN

## 2015-08-29 MED ORDER — REGADENOSON 0.4 MG/5ML IV SOLN
INTRAVENOUS | Status: AC
  Filled 2015-08-29: qty 5

## 2015-08-29 MED ORDER — PERFLUTREN LIPID MICROSPHERE
1.0000 mL | INTRAVENOUS | Status: AC | PRN
  Administered 2015-08-29: 2 mL via INTRAVENOUS
  Filled 2015-08-29: qty 10

## 2015-08-29 MED ORDER — INSULIN ASPART 100 UNIT/ML ~~LOC~~ SOLN
0.0000 [IU] | Freq: Four times a day (QID) | SUBCUTANEOUS | Status: DC
  Administered 2015-08-29: 3 [IU] via SUBCUTANEOUS
  Administered 2015-08-29: 5 [IU] via SUBCUTANEOUS
  Administered 2015-08-30: 3 [IU] via SUBCUTANEOUS
  Administered 2015-08-30 (×2): 2 [IU] via SUBCUTANEOUS

## 2015-08-29 MED ORDER — LATANOPROST 0.005 % OP SOLN
1.0000 [drp] | Freq: Every day | OPHTHALMIC | Status: DC
  Administered 2015-08-30: 1 [drp] via OPHTHALMIC
  Filled 2015-08-29: qty 2.5

## 2015-08-29 MED ORDER — ALBUTEROL SULFATE (2.5 MG/3ML) 0.083% IN NEBU: 2.5000 mg | INHALATION_SOLUTION | Freq: Four times a day (QID) | RESPIRATORY_TRACT | Status: DC | PRN

## 2015-08-29 MED ORDER — ALPRAZOLAM 0.25 MG PO TABS
0.2500 mg | ORAL_TABLET | Freq: Two times a day (BID) | ORAL | Status: DC | PRN
  Administered 2015-08-29: 0.25 mg via ORAL
  Filled 2015-08-29: qty 1

## 2015-08-29 MED ORDER — ONDANSETRON HCL 4 MG/2ML IJ SOLN: 4.0000 mg | Freq: Four times a day (QID) | INTRAMUSCULAR | Status: DC | PRN

## 2015-08-29 MED ORDER — PANTOPRAZOLE SODIUM 40 MG PO TBEC
40.0000 mg | DELAYED_RELEASE_TABLET | Freq: Every day | ORAL | Status: DC
  Administered 2015-08-29 – 2015-08-30 (×2): 40 mg via ORAL
  Filled 2015-08-29 (×2): qty 1

## 2015-08-29 MED ORDER — INSULIN GLARGINE 100 UNIT/ML ~~LOC~~ SOLN
15.0000 [IU] | Freq: Every day | SUBCUTANEOUS | Status: DC
  Administered 2015-08-29 – 2015-08-30 (×2): 15 [IU] via SUBCUTANEOUS
  Filled 2015-08-29 (×2): qty 0.15

## 2015-08-29 MED ORDER — ENOXAPARIN SODIUM 40 MG/0.4ML ~~LOC~~ SOLN
40.0000 mg | SUBCUTANEOUS | Status: DC
  Administered 2015-08-29 – 2015-08-30 (×2): 40 mg via SUBCUTANEOUS
  Filled 2015-08-29 (×2): qty 0.4

## 2015-08-29 MED ORDER — ASPIRIN EC 81 MG PO TBEC
81.0000 mg | DELAYED_RELEASE_TABLET | Freq: Every day | ORAL | Status: DC
  Administered 2015-08-29 – 2015-08-30 (×2): 81 mg via ORAL
  Filled 2015-08-29 (×3): qty 1

## 2015-08-29 MED ORDER — GABAPENTIN 300 MG PO CAPS
300.0000 mg | ORAL_CAPSULE | Freq: Three times a day (TID) | ORAL | Status: DC
  Administered 2015-08-29 – 2015-08-30 (×4): 300 mg via ORAL
  Filled 2015-08-29 (×4): qty 1

## 2015-08-29 MED ORDER — MORPHINE SULFATE (PF) 2 MG/ML IV SOLN
2.0000 mg | INTRAVENOUS | Status: DC | PRN
  Administered 2015-08-29: 2 mg via INTRAVENOUS
  Filled 2015-08-29 (×2): qty 1

## 2015-08-29 MED ORDER — REGADENOSON 0.4 MG/5ML IV SOLN
0.4000 mg | Freq: Once | INTRAVENOUS | Status: AC
  Administered 2015-08-29: 0.4 mg via INTRAVENOUS
  Filled 2015-08-29: qty 5

## 2015-08-29 NOTE — Progress Notes (Signed)
[facility] PROGRESS NOTE  Valerie [name] SVX:[id number] DOB: 01/18/1946 DOA: 08/29/2015 PCP: Valerie Calico, MD  Assessment/Plan: 1. Chest pain, atypical -Atypical in nature -initial EKG and troponin does not show any signs of acute ischemia. -thus far trop neg x 2 -her chest x-ray is pending. -D-dimer is mildly elevated, have ordered CTA chest with no evidence of PE -Cardiology was consulted - recommended 2 day stress -Continue with aspirin, Coreg, losartan, hydrochlorothiazide. [name]  2.Type II diabetes mellitus with manifestations -Held oral hypoglycemic agent and continued the patient on sliding scale insulin.  3.Hyperlipidemia with target LDL less than 100 -Continued home medication.  4.Iron deficiency anemia- -hemoglobin is 10 which appears to be chronically low  -Iron is within normal limits.  5.Depression with anxiety -Continue home medication. -Pt reports feeling very stressed/anxious recently as she has been caring for sick family members  6.Essential hypertension, benign - Blood pressure at present mild elevated. - Continue home medication.  7.GERD - Continue PPI.  8.Hypothyroidism - Continue Synthroid.  9.Sleep apnea - Continuing BiPAP daily at bedtime.  10.Morbid obesity with BMI of 45.0-49.9,  - cont heart healthy/cardiac diet  Code Status: Full Family Communication: Pt in room (indicate person spoken with, relationship, and if by phone, the number) Disposition Plan: Pending   Consultants:  Cardiology  Procedures:    Antibiotics:   (indicate start date, and stop date if known)  HPI/Subjective: Feels stressed and anxious  Objective: Filed Vitals:   08/29/15 1048 08/29/15 1050 08/29/15 1359 08/29/15 1500  BP: 141/85 138/81 143/77 145/74  Pulse: 83 82 71 71  Temp:   98.4 F (36.9 C) 97.7 F (36.5 C)  TempSrc:   Oral Oral  Resp:   20 18  Height:      Weight:      SpO2:   100% 98%    Intake/Output Summary (Last 24 hours) at  08/29/15 1628 Last data filed at 08/29/15 1600  Gross per 24 hour  Intake    240 ml  Output      0 ml  Net    240 ml   Filed Weights   08/29/15 0431  Weight: 126.7 kg (279 lb 5.2 oz)    Exam:   General:  Awake, in nad  Cardiovascular: regular, s1, s2  Respiratory: normal resp effort, no wheezing  Abdomen: soft,nondistended  Musculoskeletal: perfused, no clubbing   Data Reviewed: Basic Metabolic Panel:  Recent Labs Lab 08/28/15 2245 08/29/15 0630  NA 139 140  K 4.3 3.8  CL 100* 102  CO2 29 28  GLUCOSE 217* 162*  BUN 11 10  CREATININE 0.95 0.85  CALCIUM 9.6 9.7   Liver Function Tests:  Recent Labs Lab 08/28/15 2245 08/29/15 0630  AST 35 33  ALT 25 27  ALKPHOS 79 78  BILITOT 0.5 0.5  PROT 7.4 7.5  ALBUMIN 3.9 4.0    Recent Labs Lab 08/28/15 2245  LIPASE 45   No results for input(s): AMMONIA in the last 168 hours. CBC:  Recent Labs Lab 08/28/15 2245 08/29/15 0630  WBC 12.7* 11.2*  NEUTROABS 7.5 6.2  HGB 10.4* 10.4*  HCT 33.2* 33.8*  MCV 86.9 87.8  PLT 367 360   Cardiac Enzymes: No results for input(s): CKTOTAL, CKMB, CKMBINDEX, TROPONINI in the last 168 hours. BNP (last 3 results)  Recent Labs  08/29/15 0650  BNP 10.8    ProBNP (last 3 results) No results for input(s): PROBNP in the last 8760 hours.  CBG:  Recent Labs Lab 08/29/15  0535 08/29/15 1436  GLUCAP 152* 152*    No results found for this or any previous visit (from the past 240 hour(s)).   Studies: Ct Angio Chest Pe W/cm &/or Wo Cm  08/29/2015   CLINICAL DATA:  Chest pain since last night  EXAM: CT ANGIOGRAPHY CHEST WITH CONTRAST  TECHNIQUE: Multidetector CT imaging of the chest was performed using the standard protocol during bolus administration of intravenous contrast. Multiplanar CT image reconstructions and MIPs were obtained to evaluate the vascular anatomy.  CONTRAST:  96mL OMNIPAQUE IOHEXOL 350 MG/ML SOLN  COMPARISON:  07/10/2008  FINDINGS: No evidence of  acute pulmonary thromboembolism  No evidence of aortic dissection. Circumflex coronary artery distribution calcifications noted  No evidence of abnormal mediastinal adenopathy.  No pneumothorax.  No pleural effusion.  Lungs clear.  No fractures.  Small hiatal hernia.  Review of the MIP images confirms the above findings.  IMPRESSION: No evidence of pulmonary thromboembolism.   Electronically Signed   By: Valerie [name] M.D.   On: 08/29/2015 13:35   Dg Chest Port 1 View  08/29/2015   CLINICAL DATA:  Chest pain since last night.  EXAM: PORTABLE CHEST - 1 VIEW  COMPARISON:  02/27/2013  FINDINGS: Cardiomediastinal silhouette is enlarged given for portable technique. Mediastinal contours appear intact. Atherosclerotic calcifications of the aorta noted.  There is no evidence of pneumothorax. Left costophrenic angle is obscured Valerie [name] which may be due to pleural effusion with associated left lower lobe atelectasis. Large skin fold however overlies the lower thorax, and these findings may be partially artifactual.  Osseous structures are without acute abnormality. Soft tissues are grossly normal.  IMPRESSION: Possible left pleural effusion with lower lobe atelectasis. These findings may be exaggerated by a large skin fold overlying the lower thorax. Repeated PA and lateral chest radiography may be considered.  Enlarged cardiac silhouette.   Electronically Signed   By: Valerie [name] M.D.   On: 08/29/2015 08:18    Scheduled Meds: . amLODipine  10 mg Oral Daily  . aspirin EC  81 mg Oral Daily  . carvedilol  25 mg Oral BID WC  . enoxaparin (LOVENOX) injection  40 mg Subcutaneous Q24H  . gabapentin  300 mg Oral TID  . insulin aspart  0-15 Units Subcutaneous Q6H  . insulin glargine  15 Units Subcutaneous Daily  . latanoprost  1 drop Both Eyes QHS  . pantoprazole  40 mg Oral Daily  . regadenoson      . rosuvastatin  10 mg Oral Daily   Continuous Infusions:   Principal Problem:   Chest pain,  atypical Active Problems:   Type II diabetes mellitus with manifestations   Hyperlipidemia with target LDL less than 100   Iron deficiency anemia   Depression with anxiety   Essential hypertension, benign   GERD   Hypothyroidism   Sleep apnea   Morbid obesity with BMI of 45.0-49.9, adult    Valerie [name], [facility] Pager [phone]. If 7PM-7AM, please contact night-coverage at [email or web address], password [facility] 08/29/2015, 4:28 PM

## 2015-08-29 NOTE — ED Notes (Signed)
Pt refusing to wear pulse ox

## 2015-08-29 NOTE — ED Provider Notes (Signed)
CSN: [id number]     Arrival date & time 08/28/15  2217 History  This chart was scribed for Valerie Biles, MD by [name], ED Scribe. This patient was seen in room A13C/A13C and the patient's care was started at 1:54 AM.     Chief Complaint  Patient presents with  . Chest Pain  . Anxiety   The history is provided by the patient. No language interpreter was used.    HPI Comments: Valerie [name] is a 69 y.o. female with Hx of anxiety, type II DM, HLD, GERD, HTN who presents to the Emergency Department complaining of moderate, left-sided, intermittent, stabbing CP onset tonight at [place] while watching TV. She states that episodes of CP last for seconds. Pt reports that she is currently pain-free. She states that she took ASA PTA. She states that her anxiety has presented with CP in the past, but not as severe. She notes no modifying factors. She notes cardiac stress test 3 years ago with [facility]. No hx of similar symptoms. No Hx of MI. Pt is a non-smoker. Pt takes 81 mg ASA qd. She denies nausea, vomiting, SOB, diaphoresis, cough.   Past Medical History  Diagnosis Date  . Anemia   . Depression   . Diabetes mellitus, type 2   . GERD (gastroesophageal reflux disease)   . Hyperlipidemia   . Hypertension   . Sleep apnea   . Asthma   . Hemorrhoids   . Fatigue   . Snoring disorder   . Memory deficit 10/04/2013  . Obesity   . Anxiety and depression   . Glaucoma   . Degenerative arthritis   . Hypothyroid   . Hypoxemia 11/24/2013   Past Surgical History  Procedure Laterality Date  . Benign tumors resected    . Tubal ligation    . Rotator cuff repair    . Cataract extraction Left   . Refractive surgery      Glaucoma  . Foot surgery Left     bone spur   Family History  Problem Relation Age of Onset  . Hypertension      family history  . Alcohol abuse    . Alcohol abuse Mother   . Heart attack Mother   . Coronary artery disease Brother   . Heart attack Father   . Heart  disease Sister   . Atrial fibrillation Sister   . Hypertension Sister    Social History  Substance Use Topics  . Smoking status: Never Smoker   . Smokeless tobacco: Never Used  . Alcohol Use: No   OB History    No data available     Review of Systems  Constitutional: Negative for diaphoresis.  Respiratory: Negative for cough and shortness of breath.   Cardiovascular: Positive for chest pain.  Gastrointestinal: Negative for nausea and vomiting.   A complete 10 system review of systems was obtained and all systems are negative except as noted in the HPI and PMH.   Allergies  Food and Penicillins  Home Medications   Prior to Admission medications   Medication Sig Start Date End Date Taking? Authorizing Provider  albuterol (VENTOLIN HFA) 108 (90 BASE) MCG/ACT inhaler Inhale 2 puffs into the lungs every 6 (six) hours as needed. For shortness of breath 08/18/13  Yes Janith Lima, MD  amLODipine (NORVASC) 10 MG tablet Take 1 tablet (10 mg total) by mouth daily. 01/18/15  Yes Janith Lima, MD  aspirin 81 MG tablet Take 81 mg by  mouth daily.   Yes Historical Provider, MD  carvedilol (COREG) 25 MG tablet TAKE ONE TABLET BY MOUTH TWICE DAILY WITH MEALS 10/07/14  Yes Janith Lima, MD  gabapentin (NEURONTIN) 300 MG capsule TAKE ONE CAPSULE BY MOUTH THREE TIMES DAILY 01/18/15  Yes Janith Lima, MD  Insulin Glargine (TOUJEO SOLOSTAR) 300 UNIT/ML SOPN Inject 30 Units into the skin daily. 01/14/15  Yes Janith Lima, MD  latanoprost (XALATAN) 0.005 % ophthalmic solution Place 1 drop into both eyes at bedtime.   Yes Historical Provider, MD  losartan-hydrochlorothiazide (HYZAAR) 100-12.5 MG per tablet Take 1 tablet by mouth daily. 02/01/15  Yes Janith Lima, MD  omeprazole (PRILOSEC) 40 MG capsule Take 1 capsule (40 mg total) by mouth daily. 10/27/13  Yes Janith Lima, MD  rosuvastatin (CRESTOR) 10 MG tablet Take 1 tablet (10 mg total) by mouth daily. 07/13/14  Yes Janith Lima, MD   SitaGLIPtin-MetFORMIN HCl (JANUMET XR) [phone] MG TB24 Take 1 tablet by mouth daily. 11/29/14  Yes Janith Lima, MD  FREESTYLE TEST STRIPS test strip TEST BLOOD SUGAR UP TO THREE TIMES DAILY. 07/27/15   Janith Lima, MD  Insulin Pen Needle 31G X 5 MM MISC Use once daily with insulin 01/14/15   Janith Lima, MD   BP 145/76 mmHg  Pulse 68  Temp(Src) 98.3 F (36.8 C) (Oral)  Resp 16  Ht 5\' 6"  (1.676 m)  Wt 279 lb 5.2 oz (126.7 kg)  BMI 45.11 kg/m2  SpO2 100% Physical Exam  Constitutional: She is oriented to person, place, and time. She appears well-developed and well-nourished.  HENT:  Head: Normocephalic and atraumatic.  Eyes: Conjunctivae and EOM are normal. Pupils are equal, round, and reactive to light.  Neck: Normal range of motion. Neck supple. No JVD present.  Cardiovascular: Normal rate, regular rhythm and normal heart sounds.   Pulses:      Radial pulses are 2+ on the right side, and 2+ on the left side.  Radial pulses 2+ and equal bilaterally.   Pulmonary/Chest: Effort normal and breath sounds normal. No respiratory distress.  Anterior lungs CTA.   Abdominal: She exhibits no distension.  Musculoskeletal: Normal range of motion.  No pitting edema. No calf swelling.   Neurological: She is alert and oriented to person, place, and time.  Skin: Skin is warm and dry.  Psychiatric: She has a normal mood and affect. Her behavior is normal.  Nursing note and vitals reviewed.   ED Course  Procedures (including critical care time) DIAGNOSTIC STUDIES: Oxygen Saturation is 100% on RA, normal by my interpretation.    COORDINATION OF CARE: 2:00 AM Discussed treatment plan with pt at bedside and pt agreed to plan.   Labs Review Labs Reviewed  CBC WITH DIFFERENTIAL/PLATELET - Abnormal; Notable for the following:    WBC 12.7 (*)    RBC 3.82 (*)    Hemoglobin 10.4 (*)    HCT 33.2 (*)    RDW 15.6 (*)    All other components within normal limits  COMPREHENSIVE METABOLIC  PANEL - Abnormal; Notable for the following:    Chloride 100 (*)    Glucose, Bld 217 (*)    All other components within normal limits  CBC WITH DIFFERENTIAL/PLATELET - Abnormal; Notable for the following:    WBC 11.2 (*)    RBC 3.85 (*)    Hemoglobin 10.4 (*)    HCT 33.8 (*)    RDW 15.6 (*)    All other  components within normal limits  COMPREHENSIVE METABOLIC PANEL - Abnormal; Notable for the following:    Glucose, Bld 162 (*)    All other components within normal limits  D-DIMER, QUANTITATIVE (NOT AT [facility]) - Abnormal; Notable for the following:    D-Dimer, Quant 0.53 (*)    All other components within normal limits  GLUCOSE, CAPILLARY - Abnormal; Notable for the following:    Glucose-Capillary 152 (*)    All other components within normal limits  LIPASE, BLOOD  PROTIME-INR  BRAIN NATRIURETIC PEPTIDE  HEMOGLOBIN A1C  I-STAT TROPOININ, ED  Randolm Idol, ED    Imaging Review Dg Chest Port 1 View  08/29/2015   CLINICAL DATA:  Chest pain since last night.  EXAM: PORTABLE CHEST - 1 VIEW  COMPARISON:  02/27/2013  FINDINGS: Cardiomediastinal silhouette is enlarged given for portable technique. Mediastinal contours appear intact. Atherosclerotic calcifications of the aorta noted.  There is no evidence of pneumothorax. Left costophrenic angle is obscured Athena Masse which may be due to pleural effusion with associated left lower lobe atelectasis. Large skin fold however overlies the lower thorax, and these findings may be partially artifactual.  Osseous structures are without acute abnormality. Soft tissues are grossly normal.  IMPRESSION: Possible left pleural effusion with lower lobe atelectasis. These findings may be exaggerated by a large skin fold overlying the lower thorax. Repeated PA and lateral chest radiography may be considered.  Enlarged cardiac silhouette.   Electronically Signed   By: Fidela Salisbury M.D.   On: 08/29/2015 08:18   I have personally reviewed and evaluated  these images and lab results as part of my medical decision-making.   EKG Interpretation   Date/Time:  Wednesday August 28 2015 22:23:15 EDT Ventricular Rate:  90 PR Interval:  152 QRS Duration: 74 QT Interval:  364 QTC Calculation: 445 R Axis:   77 Text Interpretation:  Normal sinus rhythm Normal ECG No acute changes No  significant change since Confirmed by Kathrynn Humble, MD, Dalayla Aldredge [id number]) on  08/29/2015 1:56:21 AM      MDM   Final diagnoses:  Atypical chest pain   I personally performed the services described in this documentation, which was scribed in my presence. The recorded information has been reviewed and is accurate.  Pt comes in with cc of chest pain. Atypical, stabbing type pain to the L side that wasn't pleuritic, exertional, reproducible with palpation when she had it. She is currently chest pain free.  HEAR score is 5 based on hx, age and risk factors. Will admit for serial trops.   Valerie Biles, MD 08/29/15 [phone]

## 2015-08-29 NOTE — Progress Notes (Signed)
Patient placed on BIPAP 13/9 home settings. O2 bleed in of 2L. RT will continue to monitor as needed.

## 2015-08-29 NOTE — H&P (Signed)
[facility] History and Physical  Patient: Valerie [name]  MRN: [id number]  DOB: 12/20/1945  DOS: the patient was seen and examined on 08/29/2015 PCP: [name], MD  Referring physician: Dr. Kathrynn Humble Chief Complaint: Chest pain  HPI: Valerie [name] is a 69 y.o. female with Past medical history of anemia, diabetes mellitus type 2, GERD, dyslipidemia, hypertension, sleep apnea on BiPAP, anxiety, hypothyroidism, morbid obesity. The patient is presenting with complaints of recurrent chest pain ongoing since last one day. Nexium the pain is located on the left side and this is like pruritic chest pain. The pain would last for a few seconds and then resolved on its own. No activity has association with the pain. Patient also denies having any numbness or shortness of breath diaphoresis nausea or vomiting. Denies having any leg swelling. She complains that she has some orthopnea at her baseline. She denies any recent change in her medication. She had a stress test a few years ago which was negative. Last cardiac cath was 2007 which was also normal.  The patient is coming from home  At her baseline ambulates without any support And is independent for most of her ADL; manages her medication on her own.  Review of Systems: as mentioned in the history of present illness.  A comprehensive review of the other systems is negative.  Past Medical History  Diagnosis Date  . Anemia   . Depression   . Diabetes mellitus, type 2   . GERD (gastroesophageal reflux disease)   . Hyperlipidemia   . Hypertension   . Sleep apnea   . Asthma   . Hemorrhoids   . Fatigue   . Snoring disorder   . Memory deficit 10/04/2013  . Obesity   . Anxiety and depression   . Glaucoma   . Degenerative arthritis   . Hypothyroid   . Hypoxemia 11/24/2013   Past Surgical History  Procedure Laterality Date  . Benign tumors resected    . Tubal ligation    . Rotator cuff repair    . Cataract extraction  Left   . Refractive surgery      Glaucoma  . Foot surgery Left     bone spur   Social History:  reports that she has never smoked. She has never used smokeless tobacco. She reports that she does not drink alcohol or use illicit drugs.  Allergies  Allergen Reactions  . Food Swelling    bananas  . Penicillins Swelling and Rash    Family History  Problem Relation Age of Onset  . Hypertension      family history  . Alcohol abuse    . Alcohol abuse Mother   . Heart attack Mother   . Coronary artery disease Brother   . Heart attack Father   . Heart disease Sister   . Atrial fibrillation Sister   . Hypertension Sister     Prior to Admission medications   Medication Sig Start Date End Date Taking? Authorizing Provider  albuterol (VENTOLIN HFA) 108 (90 BASE) MCG/ACT inhaler Inhale 2 puffs into the lungs every 6 (six) hours as needed. For shortness of breath 08/18/13  Yes Janith Lima, MD  amLODipine (NORVASC) 10 MG tablet Take 1 tablet (10 mg total) by mouth daily. 01/18/15  Yes Janith Lima, MD  aspirin 81 MG tablet Take 81 mg by mouth daily.   Yes Historical Provider, MD  carvedilol (COREG) 25 MG tablet TAKE ONE TABLET BY MOUTH TWICE DAILY WITH MEALS  10/07/14  Yes Janith Lima, MD  gabapentin (NEURONTIN) 300 MG capsule TAKE ONE CAPSULE BY MOUTH THREE TIMES DAILY 01/18/15  Yes Janith Lima, MD  Insulin Glargine (TOUJEO SOLOSTAR) 300 UNIT/ML SOPN Inject 30 Units into the skin daily. 01/14/15  Yes Janith Lima, MD  latanoprost (XALATAN) 0.005 % ophthalmic solution Place 1 drop into both eyes at bedtime.   Yes Historical Provider, MD  losartan-hydrochlorothiazide (HYZAAR) 100-12.5 MG per tablet Take 1 tablet by mouth daily. 02/01/15  Yes Janith Lima, MD  omeprazole (PRILOSEC) 40 MG capsule Take 1 capsule (40 mg total) by mouth daily. 10/27/13  Yes Janith Lima, MD  rosuvastatin (CRESTOR) 10 MG tablet Take 1 tablet (10 mg total) by mouth daily. 07/13/14  Yes Janith Lima, MD    SitaGLIPtin-MetFORMIN HCl (JANUMET XR) [phone] MG TB24 Take 1 tablet by mouth daily. 11/29/14  Yes Janith Lima, MD  FREESTYLE TEST STRIPS test strip TEST BLOOD SUGAR UP TO THREE TIMES DAILY. 07/27/15   Janith Lima, MD  Insulin Pen Needle 31G X 5 MM MISC Use once daily with insulin 01/14/15   Janith Lima, MD    Physical Exam: Filed Vitals:   08/29/15 0315 08/29/15 0330 08/29/15 0345 08/29/15 0431  BP: 137/69 138/63 142/71 188/78  Pulse:    71  Temp:    98.3 F (36.8 C)  TempSrc:    Oral  Resp: 15 12 13 16   Height:    5\' 6"  (1.676 m)  Weight:    126.7 kg (279 lb 5.2 oz)  SpO2:    100%    General: Alert, Awake and Oriented to Time, Place and Person. Appear in mild distress Eyes: PERRL ENT: Oral Mucosa clear moist. Neck: no JVD Cardiovascular: S1 and S2 Present, no Murmur, Peripheral Pulses Present Respiratory: Bilateral Air entry equal and Decreased,  Clear to Auscultation, no Crackles, no wheezes Abdomen: Bowel Sound present, Soft and no tenderness Skin: no Rash Extremities: no Pedal edema, no calf tenderness Neurologic: Grossly no focal neuro deficit.  Labs on Admission:  CBC:  Recent Labs Lab 08/28/15 2245  WBC 12.7*  NEUTROABS 7.5  HGB 10.4*  HCT 33.2*  MCV 86.9  PLT 367    CMP     Component Value Date/Time   NA 139 08/28/2015 2245   K 4.3 08/28/2015 2245   CL 100* 08/28/2015 2245   CO2 29 08/28/2015 2245   GLUCOSE 217* 08/28/2015 2245   BUN 11 08/28/2015 2245   CREATININE 0.95 08/28/2015 2245   CALCIUM 9.6 08/28/2015 2245   PROT 7.4 08/28/2015 2245   ALBUMIN 3.9 08/28/2015 2245   AST 35 08/28/2015 2245   ALT 25 08/28/2015 2245   ALKPHOS 79 08/28/2015 2245   BILITOT 0.5 08/28/2015 2245   GFRNONAA >60 08/28/2015 2245   GFRAA >60 08/28/2015 2245    No results for input(s): CKTOTAL, CKMB, CKMBINDEX, TROPONINI in the last 168 hours. BNP (last 3 results) No results for input(s): BNP in the last 8760 hours.  ProBNP (last 3 results) No results  for input(s): PROBNP in the last 8760 hours.   Radiological Exams on Admission: No results found. EKG: Independently reviewed. Initial EKG had wandering baseline. Repeat EKG does not show any acute ST-T wave changes suggesting acute ischemia..  Assessment/Plan 1. Chest pain, atypical The patient presents with chest pain. The pain is atypical in nature and located on the left side and is pleuritic. It is nonreproducible. her initial EKG and troponin  does not show any signs of acute ischemia. her chest x-ray is pending. her d-dimer is pending At present I will admit the patient to the hospital for observation. I will obtain serial troponin every 6 hours, monitor on telemetry, get 2D echocardiogram in the morning to rule out ACS.  Continue with aspirin, Coreg, losartan, hydrochlorothiazide. [name]  2.Type II diabetes mellitus with manifestations Holding oral hypoglycemic agent and placing the patient on sliding scale insulin.  3.Hyperlipidemia with target LDL less than 100 Continue home medication.  4.Iron deficiency anemia hemoglobin is 10 which appears to be chronically low although she's not taking any iron supplementation. Would check irons of levels and start her on iron as well as folic acid.  5.Depression with anxiety Continue home medication. Adding Xanax for anxiety.  6.Essential hypertension, benign Blood pressure at present mild elevated. Continue home medication.  7.GERD Continue PPI.  8.Hypothyroidism Continue Synthroid.  9.Sleep apnea Continuing BiPAP daily at bedtime.  10.Morbid obesity with BMI of 45.0-49.9, adult Cardiac diet  Nutrition: Nothing by mouth except medication DVT Prophylaxis: subcutaneous Heparin  Advance goals of care discussion: Full code   Consults: Cardiology  Disposition: Admitted as observation, telemetry unit. Estimated length of stay: One day  Author: Berle Mull, MD [facility] Pager: [phone] 08/29/2015  If  7PM-7AM, please contact night-coverage [email or web address] Password [email or web address]

## 2015-08-29 NOTE — Progress Notes (Signed)
Pt arrived to floor with NT. Pt is alert, orient, and able to ambulate from stretcher to bed with no complications. VS are normal. Negative for chest pain. Pt refuses to wear hospital socks with grips on the bottom. Educated on the safety of socks but she still refuses.

## 2015-08-29 NOTE — Consult Note (Signed)
Patient ID: Valerie [name] MRN: [id number] DOB/AGE: Aug 30, 1946 69 y.o.  Admit date: 08/29/2015 Referring Physician: Wyline Copas Primary Cardiologist: Percival Spanish Reason for Consultation: Chest pain  HPI: 69 yo female with history of DM, HLD, HTN, OSA, obesity, anxiety and depression admitted with chest pain. She has been seen in our office in the past by Dr. Percival Spanish. She presented to the ED last night with complaint of chest pain. This is described as sharp, shooting pains across her left chest wall. No radiation into the neck or shoulder. She does feel dyspneic when the pain occurs. She has had multiple episodes of pain over the last 24 hours. Each episode lasts for a few seconds. No associated nausea or vomiting, diaphoresis. Pain occurs at rest. No exertional chest pain. Pain does not occur with inspiration. No pain with with position changes. She had a cardiac cath in 2007 with overall normal coronary arteries with mild calcification noted. She was seen in 2011 by Dr. Percival Spanish and underwent stress testing which was normal. She has been relatively inactive and reports recent weight gain. Similar episode of chest pain last week.   POC troponin negative x 2. EKG is normal without ischemic changes. D-dimer is slightly elevated. She is having no active pain at this time.    Past Medical History  Diagnosis Date  . Anemia   . Depression   . Diabetes mellitus, type 2   . GERD (gastroesophageal reflux disease)   . Hyperlipidemia   . Hypertension   . Sleep apnea   . Asthma   . Hemorrhoids   . Fatigue   . Snoring disorder   . Memory deficit 10/04/2013  . Obesity   . Anxiety and depression   . Glaucoma   . Degenerative arthritis   . Hypothyroid   . Hypoxemia 11/24/2013    Family History  Problem Relation Age of Onset  . Hypertension      family history  . Alcohol abuse    . Alcohol abuse Mother   . Heart attack Mother   . Coronary artery disease Brother   . Heart attack Father   .  Heart disease Sister   . Atrial fibrillation Sister   . Hypertension Sister     Social History   Social History  . Marital Status: Married    Spouse Name: N/A  . Number of Children: 3  . Years of Education: 11th   Occupational History  . disabled    Social History Main Topics  . Smoking status: Never Smoker   . Smokeless tobacco: Never Used  . Alcohol Use: No  . Drug Use: No  . Sexual Activity: Not Currently   Other Topics Concern  . Not on file   Social History Narrative   Lives with spouse; spouse disabled with knees; also caregiver for son who is 19 yo and is disabled.    Past Surgical History  Procedure Laterality Date  . Benign tumors resected    . Tubal ligation    . Rotator cuff repair    . Cataract extraction Left   . Refractive surgery      Glaucoma  . Foot surgery Left     bone spur    Allergies  Allergen Reactions  . Food Swelling    bananas  . Penicillins Swelling and Rash   Hospital medications:  . amLODipine  10 mg Oral Daily  . aspirin EC  81 mg Oral Daily  . carvedilol  25 mg Oral BID  WC  . enoxaparin (LOVENOX) injection  40 mg Subcutaneous Q24H  . gabapentin  300 mg Oral TID  . insulin aspart  0-15 Units Subcutaneous Q6H  . insulin glargine  15 Units Subcutaneous Daily  . latanoprost  1 drop Both Eyes QHS  . pantoprazole  40 mg Oral Daily  . rosuvastatin  10 mg Oral Daily    Prior to Admission medications   Medication Sig Start Date End Date Taking? Authorizing Provider  albuterol (VENTOLIN HFA) 108 (90 BASE) MCG/ACT inhaler Inhale 2 puffs into the lungs every 6 (six) hours as needed. For shortness of breath 08/18/13  Yes Janith Lima, MD  amLODipine (NORVASC) 10 MG tablet Take 1 tablet (10 mg total) by mouth daily. 01/18/15  Yes Janith Lima, MD  aspirin 81 MG tablet Take 81 mg by mouth daily.   Yes Historical Provider, MD  carvedilol (COREG) 25 MG tablet TAKE ONE TABLET BY MOUTH TWICE DAILY WITH MEALS 10/07/14  Yes Janith Lima, MD    gabapentin (NEURONTIN) 300 MG capsule TAKE ONE CAPSULE BY MOUTH THREE TIMES DAILY 01/18/15  Yes Janith Lima, MD  Insulin Glargine (TOUJEO SOLOSTAR) 300 UNIT/ML SOPN Inject 30 Units into the skin daily. 01/14/15  Yes Janith Lima, MD  latanoprost (XALATAN) 0.005 % ophthalmic solution Place 1 drop into both eyes at bedtime.   Yes Historical Provider, MD  losartan-hydrochlorothiazide (HYZAAR) 100-12.5 MG per tablet Take 1 tablet by mouth daily. 02/01/15  Yes Janith Lima, MD  omeprazole (PRILOSEC) 40 MG capsule Take 1 capsule (40 mg total) by mouth daily. 10/27/13  Yes Janith Lima, MD  rosuvastatin (CRESTOR) 10 MG tablet Take 1 tablet (10 mg total) by mouth daily. 07/13/14  Yes Janith Lima, MD  SitaGLIPtin-MetFORMIN HCl (JANUMET XR) [id number] MG TB24 Take 1 tablet by mouth daily. 11/29/14  Yes Janith Lima, MD  FREESTYLE TEST STRIPS test strip TEST BLOOD SUGAR UP TO THREE TIMES DAILY. 07/27/15   Janith Lima, MD  Insulin Pen Needle 31G X 5 MM MISC Use once daily with insulin 01/14/15   Janith Lima, MD    Review of systems complete and found to be negative unless listed above    Physical Exam: Blood pressure 145/76, pulse 68, temperature 98.3 F (36.8 C), temperature source Oral, resp. rate 16, height 5' 6"$  (1.676 m), weight 279 lb 5.2 oz (126.7 kg), SpO2 100 %.    General: Well developed, well nourished, NAD  HEENT: OP clear, mucus membranes moist  SKIN: warm, dry. No rashes.  Neuro: No focal deficits  Musculoskeletal: Muscle strength 5/5 all ext  Psychiatric: Mood and affect normal  Neck: No JVD, no carotid bruits, no thyromegaly, no lymphadenopathy.  Lungs:Clear bilaterally, no wheezes, rhonci, crackles  Cardiovascular: Regular rate and rhythm. No murmurs, gallops or rubs.  Abdomen:Soft. Bowel sounds present. Non-tender.  Extremities: No lower extremity edema. Pulses are 2 + in the bilateral DP/PT.  Labs:   Lab Results  Component Value Date   WBC 11.2* 08/29/2015   HGB  10.4* 08/29/2015   HCT 33.8* 08/29/2015   MCV 87.8 08/29/2015   PLT 360 08/29/2015    Recent Labs Lab 08/29/15 0630  NA 140  K 3.8  CL 102  CO2 28  BUN 10  CREATININE 0.85  CALCIUM 9.7  PROT 7.5  BILITOT 0.5  ALKPHOS 78  ALT 27  AST 33  GLUCOSE 162*   Troponin (Point of Care Test)  Recent Labs  08/29/15 0221  TROPIPOC 0.00     Chest x-ray:  Cardiomediastinal silhouette is enlarged given for portable technique. Mediastinal contours appear intact. Atherosclerotic calcifications of the aorta noted.  There is no evidence of pneumothorax. Left costophrenic angle is obscured Athena Masse which may be due to pleural effusion with associated left lower lobe atelectasis. Large skin fold however overlies the lower thorax, and these findings may be partially artifactual.  Osseous structures are without acute abnormality. Soft tissues are grossly normal.  IMPRESSION: Possible left pleural effusion with lower lobe atelectasis. These findings may be exaggerated by a large skin fold overlying the lower thorax. Repeated PA and lateral chest radiography may be considered.  Enlarged cardiac silhouette.  EKG: NSR, no ischemic changes.   ASSESSMENT AND PLAN:   1. Chest pain, precordial: Her pain is atypical. She does not think this is consistent with her normal chest pain from GERD. The pain occurs at rest and only lasts for several seconds. No objective evidence of ischemia. Troponin is initially negative and EKG is normal. I do not think this represents ACS but given risk factors for CAD including DM, obesity, HTN, HLD and strong FH of CAD, would proceed with stress testing to exclude ischemia. I have arranged a 2 day stress myoview. Echo has been ordered by the primary team. Continue to cycle cardiac markers.   2. Elevated d-dimer: Would consider excluding PE as part of the workup. Per primary team.   3. Diabetes mellitus  4. HTN  5. Hyperlipidemia  We will follow  with you.    Signed: Lauree Chandler, MD 08/29/2015, 9:18 AM

## 2015-08-29 NOTE — Progress Notes (Signed)
  Echocardiogram 2D Echocardiogram has been performed.  Jennette Dubin 08/29/2015, 3:43 PM

## 2015-08-29 NOTE — Progress Notes (Signed)
Lexiscan myoview -2 DAY study, first day completed without complications.  Results tomorrow

## 2015-08-30 DIAGNOSIS — E785 Hyperlipidemia, unspecified: Secondary | ICD-10-CM

## 2015-08-30 DIAGNOSIS — R079 Chest pain, unspecified: Secondary | ICD-10-CM | POA: Diagnosis not present

## 2015-08-30 DIAGNOSIS — I10 Essential (primary) hypertension: Secondary | ICD-10-CM | POA: Diagnosis not present

## 2015-08-30 DIAGNOSIS — F418 Other specified anxiety disorders: Secondary | ICD-10-CM | POA: Diagnosis not present

## 2015-08-30 DIAGNOSIS — R0789 Other chest pain: Secondary | ICD-10-CM | POA: Diagnosis not present

## 2015-08-30 DIAGNOSIS — Z6841 Body Mass Index (BMI) 40.0 and over, adult: Secondary | ICD-10-CM

## 2015-08-30 DIAGNOSIS — G4730 Sleep apnea, unspecified: Secondary | ICD-10-CM

## 2015-08-30 DIAGNOSIS — E118 Type 2 diabetes mellitus with unspecified complications: Secondary | ICD-10-CM

## 2015-08-30 LAB — NM MYOCAR MULTI W/SPECT W/WALL MOTION / EF
Estimated workload: 1 METS
Exercise duration (min): 0 min
Exercise duration (sec): 0 s
LV dias vol: 70 mL
LV sys vol: 19 mL
MPHR: 153 {beats}/min
Peak HR: 109 {beats}/min
Percent HR: 71 %
RATE: 0.78
RPE: 0
Rest HR: 72 {beats}/min
SDS: 9
SRS: 5
SSS: 14
TID: 1.03

## 2015-08-30 LAB — HEMOGLOBIN A1C
Hgb A1c MFr Bld: 7.8 % — ABNORMAL HIGH (ref 4.8–5.6)
Mean Plasma Glucose: 177 mg/dL

## 2015-08-30 LAB — GLUCOSE, CAPILLARY
Glucose-Capillary: 147 mg/dL — ABNORMAL HIGH (ref 65–99)
Glucose-Capillary: 150 mg/dL — ABNORMAL HIGH (ref 65–99)
Glucose-Capillary: 182 mg/dL — ABNORMAL HIGH (ref 65–99)

## 2015-08-30 MED ORDER — ALPRAZOLAM 0.25 MG PO TABS: 0.2500 mg | ORAL_TABLET | Freq: Two times a day (BID) | ORAL | Status: DC | PRN

## 2015-08-30 MED ORDER — INFLUENZA VAC SPLIT QUAD 0.5 ML IM SUSY
0.5000 mL | PREFILLED_SYRINGE | INTRAMUSCULAR | Status: AC
Start: 2015-08-31 — End: 2015-08-30
  Administered 2015-08-30: 0.5 mL via INTRAMUSCULAR
  Filled 2015-08-30: qty 0.5

## 2015-08-30 MED ORDER — TECHNETIUM TC 99M SESTAMIBI GENERIC - CARDIOLITE
10.0000 | Freq: Once | INTRAVENOUS | Status: AC | PRN
  Administered 2015-08-30: 10 via INTRAVENOUS

## 2015-08-30 MED ORDER — TECHNETIUM TC 99M SESTAMIBI GENERIC - CARDIOLITE
30.0000 | Freq: Once | INTRAVENOUS | Status: AC | PRN
Start: 2015-08-30 — End: 2015-08-30
  Administered 2015-08-30: 30 via INTRAVENOUS

## 2015-08-30 NOTE — Progress Notes (Signed)
Discharge Note:  Patient alert and oriented X 3 and in no apparent distress.  Education given on upcoming appointments,  New medications, flu vaccine, and activity.  Patient verbalized understanding of all instructions.  Patient confirmed that she has all of her personal belongings. Telemetry and peripheral IV's removed.  Patient transported out in a wheelchair by this RN.

## 2015-08-30 NOTE — Discharge Instructions (Signed)
Metformin and X-ray Contrast Studies For some X-ray exams, a contrast dye is used. Contrast dye is a type of medicine used to make the X-ray image clearer. The contrast dye is given to the patient through a vein (intravenously). If you need to have this type of X-ray exam and you take a medication called metformin, your caregiver may have you stop taking metformin before the exam.  LACTIC ACIDOSIS In rare cases, a serious medical condition called lactic acidosis can develop in people who take metformin and receive contrast dye. The following conditions can increase the risk of this complication:   Kidney failure.  Liver problems.  Certain types of heart problems such as:  Heart failure.  Heart attack.  Heart infection.  Heart valve problems.  Alcohol abuse. If left untreated, lactic acidosis can lead to coma.  SYMPTOMS OF LACTIC ACIDOSIS Symptoms of lactic acidosis can include:  Rapid breathing (hyperventilation).  Neurologic symptoms such as:  Headaches.  Confusion.  Dizziness.  Excessive sweating.  Feeling sick to your stomach (nauseous) or throwing up (vomiting). AFTER THE X-RAY EXAM  Stay well-hydrated. Drink fluids as instructed by your caregiver.  If you have a risk of developing lactic acidosis, blood tests may be done to make sure your kidney function is okay.  Metformin is usually stopped for 48 hours after the X-ray exam. Ask your caregiver when you can start taking metformin again. SEEK MEDICAL CARE IF:   You have shortness of breath or difficulty breathing.  You develop a headache that does not go away.  You have nausea or vomiting.  You urinate more than normal.  You develop a skin rash and have:  Redness.  Swelling.  Itching. Document Released: 11/18/2009 Document Revised: 02/22/2012 Document Reviewed: 11/18/2009 [facility] Patient Information 2015 [place], [place]. This information is not intended to replace advice given to you by your health  care provider. Make sure you discuss any questions you have with your health care provider.  Do not drive or operate heavy machinery if taking sedating anxiety or pain medications

## 2015-08-30 NOTE — Discharge Summary (Addendum)
Physician Discharge Summary  Valerie [name] [id number] DOB: 1946-02-28 DOA: 08/29/2015  PCP: [name], MD  Admit date: 08/29/2015 Discharge date: 08/30/2015  Time spent: 20 minutes  Recommendations for Outpatient Follow-up:  1. Follow up with PCP as scheduled 2. Consider addressing pt's stress/anxiety  Discharge Diagnoses:  Principal Problem:   Chest pain, atypical Active Problems:   Type II diabetes mellitus with manifestations   Hyperlipidemia with target LDL less than 100   Iron deficiency anemia   Depression with anxiety   Essential hypertension, benign   GERD   Hypothyroidism   Sleep apnea   Morbid obesity with BMI of 45.0-49.9, adult   Discharge Condition: Stable  Diet recommendation: Diabetic, heart healthy  Filed Weights   08/29/15 0431  Weight: 126.7 kg (279 lb 5.2 oz)    History of present illness:  Please see admit h and p. Briefly, pt presented with atypical chest pains in the setting of known risk factors. The patient was admitted for further work up.  Hospital Course:  1. Chest pain, atypical -Atypical in nature -initial EKG and troponin does not show any signs of acute ischemia. -thus far trop neg x 2 -her chest x-ray is pending. -D-dimer is mildly elevated, have ordered CTA chest with no evidence of PE -Cardiology was consulted - recommended 2 day stress which was normal, low risk study -Continued with aspirin, Coreg, losartan, hydrochlorothiazide -Of note, patient is visibly anxious and appears quite stressed. [name]  2.Type II diabetes mellitus with manifestations -Held oral hypoglycemic agent and continued the patient on sliding scale insulin.  3.Hyperlipidemia with target LDL less than 100 -Continued home medication.  4.Iron deficiency anemia- -hemoglobin is 10 which appears to be chronically low  -Iron is within normal limits.  5.Depression with anxiety -Continue home medication. -Pt reports feeling very stressed/anxious recently  as she has been caring for sick family members -Will d/c home with low dose xanax. Recommend close outpatient follow up with PCP. -Patient denies suicidal or homicidal ideations  6.Essential hypertension, benign - Blood pressure at present mild elevated. - Continue home medication.  7.GERD - Continue PPI.  8.Hypothyroidism - Continue Synthroid.  9.Sleep apnea - Continuing BiPAP daily at bedtime.  10.Morbid obesity with BMI of 45.0-49.9,  - cont heart healthy/cardiac diet  Procedures:  2d echo  2 day stress - normal  Consultations:  Cardiology  Discharge Exam: Filed Vitals:   08/29/15 2131 08/29/15 2248 08/30/15 0407 08/30/15 1415  BP: 136/70  128/58 137/64  Pulse: 75 76 79 78  Temp: 97.9 F (36.6 C)  97.9 F (36.6 C) 98 F (36.7 C)  TempSrc: Oral  Oral Oral  Resp: 18 18 18 18   Height:      Weight:      SpO2: 100% 98% 97% 100%    General: awake, in nad Cardiovascular: regular, s1, s2 Respiratory: normal resp effort, no wheezing  Discharge Instructions     Medication List    TAKE these medications        albuterol 108 (90 BASE) MCG/ACT inhaler  Commonly known as:  VENTOLIN HFA  Inhale 2 puffs into the lungs every 6 (six) hours as needed. For shortness of breath     ALPRAZolam 0.25 MG tablet  Commonly known as:  XANAX  Take 1 tablet (0.25 mg total) by mouth 2 (two) times daily as needed for anxiety.     amLODipine 10 MG tablet  Commonly known as:  NORVASC  Take 1 tablet (10 mg total) by mouth  daily.     aspirin 81 MG tablet  Take 81 mg by mouth daily.     carvedilol 25 MG tablet  Commonly known as:  COREG  TAKE ONE TABLET BY MOUTH TWICE DAILY WITH MEALS     FREESTYLE TEST STRIPS test strip  Generic drug:  glucose blood  TEST BLOOD SUGAR UP TO THREE TIMES DAILY.     gabapentin 300 MG capsule  Commonly known as:  NEURONTIN  TAKE ONE CAPSULE BY MOUTH THREE TIMES DAILY     Insulin Glargine 300 UNIT/ML Sopn  Commonly known as:  TOUJEO  SOLOSTAR  Inject 30 Units into the skin daily.     Insulin Pen Needle 31G X 5 MM Misc  Use once daily with insulin     latanoprost 0.005 % ophthalmic solution  Commonly known as:  XALATAN  Place 1 drop into both eyes at bedtime.     losartan-hydrochlorothiazide 100-12.5 MG per tablet  Commonly known as:  HYZAAR  Take 1 tablet by mouth daily.     omeprazole 40 MG capsule  Commonly known as:  PRILOSEC  Take 1 capsule (40 mg total) by mouth daily.     rosuvastatin 10 MG tablet  Commonly known as:  CRESTOR  Take 1 tablet (10 mg total) by mouth daily.     SitaGLIPtin-MetFORMIN HCl [id number] MG Tb24  Commonly known as:  JANUMET XR  Take 1 tablet by mouth daily.       Allergies  Allergen Reactions  . Food Swelling    bananas  . Penicillins Swelling and Rash       Follow-up Information    Follow up with [name], MD On 09/04/2015.   Specialty:  Internal Medicine   Why:  as scheduled for follow up   Contact information:   [address] [address] [phone]        The results of significant diagnostics from this hospitalization (including imaging, microbiology, ancillary and laboratory) are listed below for reference.    Significant Diagnostic Studies: Ct Angio Chest Pe W/cm &/or Wo Cm  08/29/2015   CLINICAL DATA:  Chest pain since last night  EXAM: CT ANGIOGRAPHY CHEST WITH CONTRAST  TECHNIQUE: Multidetector CT imaging of the chest was performed using the standard protocol during bolus administration of intravenous contrast. Multiplanar CT image reconstructions and MIPs were obtained to evaluate the vascular anatomy.  CONTRAST:  53mL OMNIPAQUE IOHEXOL 350 MG/ML SOLN  COMPARISON:  07/10/2008  FINDINGS: No evidence of acute pulmonary thromboembolism  No evidence of aortic dissection. Circumflex coronary artery distribution calcifications noted  No evidence of abnormal mediastinal adenopathy.  No pneumothorax.  No pleural effusion.  Lungs clear.   No fractures.  Small hiatal hernia.  Review of the MIP images confirms the above findings.  IMPRESSION: No evidence of pulmonary thromboembolism.   Electronically Signed   By: Marybelle Killings M.D.   On: 08/29/2015 13:35   Nm Myocar Multi W/spect W/wall Motion / Ef  08/30/2015    The study is normal.  This is a low risk study.  The left ventricular ejection fraction is hyperdynamic (>65%).   Normal resting and stress perfusion EF 69%    Dg Chest Port 1 View  08/29/2015   CLINICAL DATA:  Chest pain since last night.  EXAM: PORTABLE CHEST - 1 VIEW  COMPARISON:  02/27/2013  FINDINGS: Cardiomediastinal silhouette is enlarged given for portable technique. Mediastinal contours appear intact. Atherosclerotic calcifications of the aorta noted.  There is no evidence of pneumothorax. Left costophrenic angle is obscured Athena Masse which may be due to pleural effusion with associated left lower lobe atelectasis. Large skin fold however overlies the lower thorax, and these findings may be partially artifactual.  Osseous structures are without acute abnormality. Soft tissues are grossly normal.  IMPRESSION: Possible left pleural effusion with lower lobe atelectasis. These findings may be exaggerated by a large skin fold overlying the lower thorax. Repeated PA and lateral chest radiography may be considered.  Enlarged cardiac silhouette.   Electronically Signed   By: Fidela Salisbury M.D.   On: 08/29/2015 08:18    Microbiology: No results found for this or any previous visit (from the past 240 hour(s)).   Labs: Basic Metabolic Panel:  Recent Labs Lab 08/28/15 2245 08/29/15 0630  NA 139 140  K 4.3 3.8  CL 100* 102  CO2 29 28  GLUCOSE 217* 162*  BUN 11 10  CREATININE 0.95 0.85  CALCIUM 9.6 9.7   Liver Function Tests:  Recent Labs Lab 08/28/15 2245 08/29/15 0630  AST 35 33  ALT 25 27  ALKPHOS 79 78  BILITOT 0.5 0.5  PROT 7.4 7.5  ALBUMIN 3.9 4.0    Recent Labs Lab 08/28/15 2245  LIPASE 45    No results for input(s): AMMONIA in the last 168 hours. CBC:  Recent Labs Lab 08/28/15 2245 08/29/15 0630  WBC 12.7* 11.2*  NEUTROABS 7.5 6.2  HGB 10.4* 10.4*  HCT 33.2* 33.8*  MCV 86.9 87.8  PLT 367 360   Cardiac Enzymes: No results for input(s): CKTOTAL, CKMB, CKMBINDEX, TROPONINI in the last 168 hours. BNP: BNP (last 3 results)  Recent Labs  08/29/15 0650  BNP 10.8    ProBNP (last 3 results) No results for input(s): PROBNP in the last 8760 hours.  CBG:  Recent Labs Lab 08/29/15 1436 08/29/15 1653 08/29/15 2349 08/30/15 0629 08/30/15 1144  GLUCAP 152* 206* 147* 150* 182*   Signed:  Nox Talent K  [facility] 08/30/2015, 2:21 PM

## 2015-08-30 NOTE — Progress Notes (Signed)
PATIENT ID:  15F with DM (a1c 7.8%), HTN, HL, OSA, obesity, anxiety and depression here with CP.  INTERVAL HISTORY: Underwent day 1/2 for Myoview.  Will go for day 2 today.  SUBJECTIVE:  Feeling well.  Continues to have occasional sharp L sided chest pain.  Lasts for a second.  Also reports DOE that is new.   PHYSICAL EXAM Filed Vitals:   08/29/15 1500 08/29/15 2131 08/29/15 2248 08/30/15 0407  BP: 145/74 136/70  128/58  Pulse: 71 75 76 79  Temp: 97.7 F (36.5 C) 97.9 F (36.6 C)  97.9 F (36.6 C)  TempSrc: Oral Oral  Oral  Resp: 18 18 18 18   Height:      Weight:      SpO2: 98% 100% 98% 97%   General:  Well-appearing.  NAD. Neck: No JVD Lungs:  CTAB. Heart:  RRR.  No m/r/g Abdomen:  Obese.  Soft, NT, ND.  Extremities:  WWP. Trace edema.  2+ DP/PT and radial pulses bilaterally.  LABS: Lab Results  Component Value Date   TROPONINI <0.30 08/03/2012   Results for orders placed or performed during the hospital encounter of 08/29/15 (from the past 24 hour(s))  Glucose, capillary     Status: Abnormal   Collection Time: 08/29/15  2:36 PM  Result Value Ref Range   Glucose-Capillary 152 (H) 65 - 99 mg/dL  Glucose, capillary     Status: Abnormal   Collection Time: 08/29/15  4:53 PM  Result Value Ref Range   Glucose-Capillary 206 (H) 65 - 99 mg/dL  Glucose, capillary     Status: Abnormal   Collection Time: 08/29/15 11:49 PM  Result Value Ref Range   Glucose-Capillary 147 (H) 65 - 99 mg/dL  Glucose, capillary     Status: Abnormal   Collection Time: 08/30/15  6:29 AM  Result Value Ref Range   Glucose-Capillary 150 (H) 65 - 99 mg/dL    Intake/Output Summary (Last 24 hours) at 08/30/15 1001 Last data filed at 08/29/15 1700  Gross per 24 hour  Intake    360 ml  Output      0 ml  Net    360 ml    EKG:  No new ECGs  Telemetry: sinus rhythm.  No events.  TTE 08/29/15:   Study Conclusions  - Left ventricle: The cavity size was normal. Wall thickness  was increased in a pattern of mild LVH. Systolic function was normal. The estimated ejection fraction was in the range of 60% to 65%. Wall motion was normal; there were no regional wall motion abnormalities. Doppler parameters are consistent with abnormal left ventricular relaxation (grade 1 diastolic dysfunction). The E/e&' ratio is between 8-15, suggesting indeterminate LV filling pressure. - Left atrium: The atrium was normal in size. - Right atrium: The atrium was mildly dilated. - Inferior vena cava: The vessel was normal in size. The respirophasic diameter changes were in the normal range (>= 50%), consistent with normal central venous pressure.  ASSESSMENT AND PLAN:  # Atypical CP: She had a witnessed episode of CP today.  Very atypical with a quick jolt of sharp pain at rest.  - Complete nuclear stress today - Continue ASA 81mg  , carvedilol  # Elevated D-Dimer: CT- angio negative for PE.  # SOB: Ms. Pennie did seem quite short of breath with ambulation back into the room.  No PE as above.  Echo showed normal LVEF and grade 1 diastolic dysfunction.  Recommend aggressive BP control to prevent  progression of diastolic dysfunction.  # Hypertension: BP mostly well-controlled. - Continue carvedilol 25mg  daily  # Hyperlipidemia: - Continue rosuvastatin 10mg  daily  Principal Problem:   Chest pain, atypical Active Problems:   Type II diabetes mellitus with manifestations   Hyperlipidemia with target LDL less than 100   Iron deficiency anemia   Depression with anxiety   Essential hypertension, benign   GERD   Hypothyroidism   Sleep apnea   Morbid obesity with BMI of 45.0-49.9, adult    Sharol Harness, MD 08/30/2015 10:01 AM

## 2015-08-31 ENCOUNTER — Other Ambulatory Visit: Payer: Self-pay | Admitting: Internal Medicine

## 2015-09-04 ENCOUNTER — Other Ambulatory Visit (INDEPENDENT_AMBULATORY_CARE_PROVIDER_SITE_OTHER): Payer: Medicare Other

## 2015-09-04 ENCOUNTER — Ambulatory Visit (INDEPENDENT_AMBULATORY_CARE_PROVIDER_SITE_OTHER): Payer: Medicare Other | Admitting: Internal Medicine

## 2015-09-04 ENCOUNTER — Encounter: Payer: Self-pay | Admitting: Internal Medicine

## 2015-09-04 VITALS — BP 128/78 | HR 81 | Temp 98.7°F | Resp 16 | Ht 63.0 in | Wt 278.0 lb

## 2015-09-04 DIAGNOSIS — K2100 Gastro-esophageal reflux disease with esophagitis, without bleeding: Secondary | ICD-10-CM

## 2015-09-04 DIAGNOSIS — E038 Other specified hypothyroidism: Secondary | ICD-10-CM

## 2015-09-04 DIAGNOSIS — D509 Iron deficiency anemia, unspecified: Secondary | ICD-10-CM

## 2015-09-04 DIAGNOSIS — M48061 Spinal stenosis, lumbar region without neurogenic claudication: Secondary | ICD-10-CM

## 2015-09-04 DIAGNOSIS — M4806 Spinal stenosis, lumbar region: Secondary | ICD-10-CM

## 2015-09-04 DIAGNOSIS — I10 Essential (primary) hypertension: Secondary | ICD-10-CM

## 2015-09-04 DIAGNOSIS — E785 Hyperlipidemia, unspecified: Secondary | ICD-10-CM

## 2015-09-04 DIAGNOSIS — F418 Other specified anxiety disorders: Secondary | ICD-10-CM

## 2015-09-04 DIAGNOSIS — E118 Type 2 diabetes mellitus with unspecified complications: Secondary | ICD-10-CM | POA: Diagnosis not present

## 2015-09-04 LAB — IBC PANEL
Iron: 74 ug/dL (ref 42–145)
Saturation Ratios: 16.2 % — ABNORMAL LOW (ref 20.0–50.0)
Transferrin: 327 mg/dL (ref 212.0–360.0)

## 2015-09-04 LAB — CBC WITH DIFFERENTIAL/PLATELET
Basophils Absolute: 0.1 10*3/uL (ref 0.0–0.1)
Basophils Relative: 1.2 % (ref 0.0–3.0)
Eosinophils Absolute: 0.2 10*3/uL (ref 0.0–0.7)
Eosinophils Relative: 2.2 % (ref 0.0–5.0)
HCT: 32.4 % — ABNORMAL LOW (ref 36.0–46.0)
Hemoglobin: 10.8 g/dL — ABNORMAL LOW (ref 12.0–15.0)
Lymphocytes Relative: 25.8 % (ref 12.0–46.0)
Lymphs Abs: 2.7 10*3/uL (ref 0.7–4.0)
MCHC: 33.4 g/dL (ref 30.0–36.0)
MCV: 83.7 fl (ref 78.0–100.0)
Monocytes Absolute: 0.9 10*3/uL (ref 0.1–1.0)
Monocytes Relative: 8.6 % (ref 3.0–12.0)
Neutro Abs: 6.4 10*3/uL (ref 1.4–7.7)
Neutrophils Relative %: 62.2 % (ref 43.0–77.0)
Platelets: 361 10*3/uL (ref 150.0–400.0)
RBC: 3.87 Mil/uL (ref 3.87–5.11)
RDW: 18.3 % — ABNORMAL HIGH (ref 11.5–15.5)
WBC: 10.3 10*3/uL (ref 4.0–10.5)

## 2015-09-04 LAB — TSH: TSH: 1.89 u[IU]/mL (ref 0.35–4.50)

## 2015-09-04 LAB — FERRITIN: Ferritin: 23.2 ng/mL (ref 10.0–291.0)

## 2015-09-04 MED ORDER — ATORVASTATIN CALCIUM 20 MG PO TABS: 20.0000 mg | ORAL_TABLET | Freq: Every day | ORAL | Status: DC

## 2015-09-04 MED ORDER — OMEPRAZOLE 40 MG PO CPDR: 40.0000 mg | DELAYED_RELEASE_CAPSULE | Freq: Every day | ORAL | Status: DC

## 2015-09-04 MED ORDER — ALPRAZOLAM 0.25 MG PO TABS: 0.2500 mg | ORAL_TABLET | Freq: Two times a day (BID) | ORAL | Status: DC | PRN

## 2015-09-04 MED ORDER — GLUCOSE BLOOD VI STRP: ORAL_STRIP | Status: DC

## 2015-09-04 MED ORDER — OXYCODONE-ACETAMINOPHEN 7.5-325 MG PO TABS: 1.0000 | ORAL_TABLET | Freq: Three times a day (TID) | ORAL | Status: DC | PRN

## 2015-09-04 NOTE — Patient Instructions (Signed)
Iron Deficiency Anemia Anemia is a condition in which there are less red blood cells or hemoglobin in the blood than normal. Hemoglobin is the part of red blood cells that carries oxygen. Iron deficiency anemia is anemia caused by too little iron. It is the most common type of anemia. It may leave you tired and short of breath. CAUSES   Lack of iron in the diet.  Poor absorption of iron, as seen with intestinal disorders.  Intestinal bleeding.  Heavy periods. SIGNS AND SYMPTOMS  Mild anemia may not be noticeable. Symptoms may include:  Fatigue.  Headache.  Pale skin.  Weakness.  Tiredness.  Shortness of breath.  Dizziness.  Cold hands and feet.  Fast or irregular heartbeat. DIAGNOSIS  Diagnosis requires a thorough evaluation and physical exam by your health care provider. Blood tests are generally used to confirm iron deficiency anemia. Additional tests may be done to find the underlying cause of your anemia. These may include:  Testing for blood in the stool (fecal occult blood test).  A procedure to see inside the colon and rectum (colonoscopy).  A procedure to see inside the esophagus and stomach (endoscopy). TREATMENT  Iron deficiency anemia is treated by correcting the cause of the deficiency. Treatment may involve:  Adding iron-rich foods to your diet.  Taking iron supplements. Pregnant or breastfeeding women need to take extra iron because their normal diet usually does not provide the required amount.  Taking vitamins. Vitamin C improves the absorption of iron. Your health care provider may recommend that you take your iron tablets with a glass of orange juice or vitamin C supplement.  Medicines to make heavy menstrual flow lighter.  Surgery. HOME CARE INSTRUCTIONS   Take iron as directed by your health care provider.  If you cannot tolerate taking iron supplements by mouth, talk to your health care provider about taking them through a vein  (intravenously) or an injection into a muscle.  For the best iron absorption, iron supplements should be taken on an empty stomach. If you cannot tolerate them on an empty stomach, you may need to take them with food.  Do not drink milk or take antacids at the same time as your iron supplements. Milk and antacids may interfere with the absorption of iron.  Iron supplements can cause constipation. Make sure to include fiber in your diet to prevent constipation. A stool softener may also be recommended.  Take vitamins as directed by your health care provider.  Eat a diet rich in iron. Foods high in iron include liver, lean beef, whole-grain bread, eggs, dried fruit, and dark [place]our leafy vegetables. SEEK IMMEDIATE MEDICAL CARE IF:   You faint. If this happens, do not drive. Call your local emergency services (911 in [place] if no other help is available.  You have chest pain.  You feel nauseous or vomit.  You have severe or increased shortness of breath with activity.  You feel weak.  You have a rapid heartbeat.  You have unexplained sweating.  You become light-headed when getting up from a chair or bed. MAKE SURE YOU:   Understand these instructions.  Will watch your condition.  Will get help right away if you are not doing well or get worse. Document Released: 11/27/2000 Document Revised: 12/05/2013 Document Reviewed: 08/07/2013 [employer] Patient Information 2015 [employer]. This information is not intended to replace advice given to you by your health care provider. Make sure you discuss any questions you have with your health care provider.  

## 2015-09-04 NOTE — Progress Notes (Signed)
Subjective:  Patient ID: Valerie [name], female    DOB: 1945/12/27  Age: 69 y.o. MRN: [id number]  CC: Hypertension; Anemia; Back Pain; Hypothyroidism; Hyperlipidemia; Depression; and Diabetes   HPI Valerie [name] presents for hospital follow-up after recently being seen for stabbing chest pains. She ruled out for myocardial infarction. She is having a lot of stress and anxiety and she saw a cardiologist several months ago who placed her on Xanax. That has helped. She asks for a refill on Xanax. She has complaints about the cost of her medications. 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 She has had no recurrences of the chest pain over the last 3-4 days.  Outpatient Prescriptions Prior to Visit  Medication Sig Dispense Refill  . albuterol (VENTOLIN HFA) 108 (90 BASE) MCG/ACT inhaler Inhale 2 puffs into the lungs every 6 (six) hours as needed. For shortness of breath 3 Inhaler 4  . amLODipine (NORVASC) 10 MG tablet TAKE ONE TABLET BY MOUTH ONCE DAILY 90 tablet 1  . aspirin 81 MG tablet Take 81 mg by mouth daily.    . carvedilol (COREG) 25 MG tablet TAKE ONE TABLET BY MOUTH TWICE DAILY WITH MEALS 180 tablet 3  . gabapentin (NEURONTIN) 300 MG capsule TAKE ONE CAPSULE BY MOUTH THREE TIMES DAILY 90 capsule 11  . Insulin Glargine (TOUJEO SOLOSTAR) 300 UNIT/ML SOPN Inject 30 Units into the skin daily. 1.5 mL 11  . Insulin Pen Needle 31G X 5 MM MISC Use once daily with insulin 100 each 3  . latanoprost (XALATAN) 0.005 % ophthalmic solution Place 1 drop into both eyes at bedtime.    [name] losartan-hydrochlorothiazide (HYZAAR) 100-12.5 MG per tablet Take 1 tablet by mouth daily. 90 tablet 3  . SitaGLIPtin-MetFORMIN HCl (JANUMET XR) [id number] MG TB24 Take 1 tablet by mouth daily. 90 tablet 3  . ALPRAZolam (XANAX) 0.25 MG tablet Take 1 tablet (0.25 mg total) by mouth 2 (two) times daily as needed for anxiety. 15 tablet 0  . FREESTYLE TEST STRIPS test strip TEST  BLOOD SUGAR UP TO THREE TIMES DAILY. 100 each 11  . omeprazole (PRILOSEC) 40 MG capsule Take 1 capsule (40 mg total) by mouth daily. 90 capsule 3  . rosuvastatin (CRESTOR) 10 MG tablet Take 1 tablet (10 mg total) by mouth daily. 90 tablet 3   No facility-administered medications prior to visit.    ROS Review of Systems  Constitutional: Negative.  Negative for fever, chills, diaphoresis, appetite change, fatigue and unexpected weight change.  HENT: Negative.  Negative for trouble swallowing and voice change.   Eyes: Negative.   Respiratory: Negative.  Negative for cough, choking, chest tightness, shortness of breath and stridor.   Cardiovascular: Negative.  Negative for chest pain, palpitations and leg swelling.  Gastrointestinal: Negative.  Negative for nausea, vomiting, abdominal pain, diarrhea, constipation and blood in stool.  Endocrine: Negative.  Negative for polydipsia, polyphagia and polyuria.  Genitourinary: Negative.  Negative for dysuria, frequency and difficulty urinating.  Musculoskeletal: Positive for back pain. Negative for myalgias, joint swelling, arthralgias and neck pain.  Skin: Negative.  Negative for rash.  Allergic/Immunologic: Negative.   Neurological: Negative.  Negative for dizziness, weakness, light-headedness and numbness.  Hematological: Negative.  Negative for adenopathy. Does not bruise/bleed easily.  Psychiatric/Behavioral: Positive for sleep disturbance and dysphoric mood. Negative for suicidal ideas, confusion, self-injury and decreased concentration. The patient is nervous/anxious. The patient is not hyperactive.     Objective:  BP  128/78 mmHg  Pulse 81  Temp(Src) 98.7 F (37.1 C) (Oral)  Resp 16  Ht 5\' 3"  (1.6 m)  Wt 278 lb (126.1 kg)  BMI 49.26 kg/m2  SpO2 98%  BP Readings from Last 3 Encounters:  09/04/15 128/78  08/30/15 137/64  04/29/15 132/70    Wt Readings from Last 3 Encounters:  09/04/15 278 lb (126.1 kg)  08/29/15 279 lb 5.2 oz  (126.7 kg)  04/29/15 280 lb 8 oz (127.234 kg)    Physical Exam  Constitutional: She is oriented to person, place, and time. No distress.  HENT:  Mouth/Throat: Oropharynx is clear and moist. No oropharyngeal exudate.  Eyes: Conjunctivae are normal. Right eye exhibits no discharge. Left eye exhibits no discharge. No scleral icterus.  Neck: Normal range of motion. Neck supple. No JVD present. No tracheal deviation present. No thyromegaly present.  Cardiovascular: Normal rate, regular rhythm, normal heart sounds and intact distal pulses.  Exam reveals no gallop and no friction rub.   No murmur heard. Pulmonary/Chest: Effort normal and breath sounds normal. No stridor. No respiratory distress. She has no wheezes. She has no rales. She exhibits no tenderness.  Abdominal: Soft. Bowel sounds are normal. She exhibits no distension and no mass. There is no tenderness. There is no rebound and no guarding.  Musculoskeletal: Normal range of motion. She exhibits no edema or tenderness.  Lymphadenopathy:    She has no cervical adenopathy.  Neurological: She is oriented to person, place, and time.  Skin: Skin is warm and dry. No rash noted. She is not diaphoretic. No erythema. No pallor.  Psychiatric: She has a normal mood and affect. Her behavior is normal. Judgment and thought content normal.    Lab Results  Component Value Date   WBC 10.3 09/04/2015   HGB 10.8* 09/04/2015   HCT 32.4* 09/04/2015   PLT 361.0 09/04/2015   GLUCOSE 162* 08/29/2015   CHOL 155 11/29/2014   TRIG 261.0* 11/29/2014   HDL 40.30 11/29/2014   LDLDIRECT 71.2 11/29/2014   LDLCALC 43 03/28/2014   ALT 27 08/29/2015   AST 33 08/29/2015   NA 140 08/29/2015   K 3.8 08/29/2015   CL 102 08/29/2015   CREATININE 0.85 08/29/2015   BUN 10 08/29/2015   CO2 28 08/29/2015   TSH 1.89 09/04/2015   INR 1.06 08/29/2015   HGBA1C 7.8* 08/29/2015   MICROALBUR 2.7* 11/29/2014    Ct Angio Chest Pe W/cm &/or Wo Cm  08/29/2015    CLINICAL DATA:  Chest pain since last night  EXAM: CT ANGIOGRAPHY CHEST WITH CONTRAST  TECHNIQUE: Multidetector CT imaging of the chest was performed using the standard protocol during bolus administration of intravenous contrast. Multiplanar CT image reconstructions and MIPs were obtained to evaluate the vascular anatomy.  CONTRAST:  50mL OMNIPAQUE IOHEXOL 350 MG/ML SOLN  COMPARISON:  07/10/2008  FINDINGS: No evidence of acute pulmonary thromboembolism  No evidence of aortic dissection. Circumflex coronary artery distribution calcifications noted  No evidence of abnormal mediastinal adenopathy.  No pneumothorax.  No pleural effusion.  Lungs clear.  No fractures.  Small hiatal hernia.  Review of the MIP images confirms the above findings.  IMPRESSION: No evidence of pulmonary thromboembolism.   Electronically Signed   By: Marybelle Killings M.D.   On: 08/29/2015 13:35   Nm Myocar Multi W/spect W/wall Motion / Ef  08/30/2015    The study is normal.  This is a low risk study.  The left ventricular ejection fraction is hyperdynamic (>65%).  Normal resting and stress perfusion EF 69%    Dg Chest Port 1 View  08/29/2015   CLINICAL DATA:  Chest pain since last night.  EXAM: PORTABLE CHEST - 1 VIEW  COMPARISON:  02/27/2013  FINDINGS: Cardiomediastinal silhouette is enlarged given for portable technique. Mediastinal contours appear intact. Atherosclerotic calcifications of the aorta noted.  There is no evidence of pneumothorax. Left costophrenic angle is obscured Athena Masse which may be due to pleural effusion with associated left lower lobe atelectasis. Large skin fold however overlies the lower thorax, and these findings may be partially artifactual.  Osseous structures are without acute abnormality. Soft tissues are grossly normal.  IMPRESSION: Possible left pleural effusion with lower lobe atelectasis. These findings may be exaggerated by a large skin fold overlying the lower thorax. Repeated PA and lateral chest  radiography may be considered.  Enlarged cardiac silhouette.   Electronically Signed   By: Fidela Salisbury M.D.   On: 08/29/2015 08:18    Assessment & Plan:   Aaradhya was seen today for hypertension, anemia, back pain, hypothyroidism, hyperlipidemia, depression and diabetes.  Diagnoses and all orders for this visit:  Essential hypertension, benign- her blood pressure is adequately well controlled, elected lites and renal function are stable.  Other specified hypothyroidism- her TSH is in the normal range, she will stay on the current dose of levothyroxine. -     TSH; Future  Type II diabetes mellitus with manifestations- her blood sugars have not been well controlled, she has not been able to afford her diabetic management medication so I gave her samples today. She'll also pursue the patient assistance program. -     glucose blood (FREESTYLE TEST STRIPS) test strip; TEST BLOOD SUGAR UP TO THREE TIMES DAILY. -     atorvastatin (LIPITOR) 20 MG tablet; Take 1 tablet (20 mg total) by mouth daily.  Hyperlipidemia with target LDL less than 100  Depression with anxiety- will continue Xanax as needed since she has been on it for several months -     ALPRAZolam (XANAX) 0.25 MG tablet; Take 1 tablet (0.25 mg total) by mouth 2 (two) times daily as needed for anxiety.  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 -     oxyCODONE-acetaminophen (PERCOCET) 7.5-325 MG per tablet; Take 1 tablet by mouth every 8 (eight) hours as needed.  Gastroesophageal reflux disease with esophagitis -     omeprazole (PRILOSEC) 40 MG capsule; Take 1 capsule (40 mg total) by mouth daily.  Iron deficiency anemia- her iron level is normal however her anemia has not improved much. This appears to be the anemia of chronic disease. -     CBC with Differential/Platelet; Future -     IBC panel; Future -     Ferritin; Future   I have discontinued Ms. Ahlgren's  rosuvastatin. I have also changed her FREESTYLE TEST STRIPS to glucose blood. Additionally, I am having her start on atorvastatin and oxyCODONE-acetaminophen. Lastly, I am having her maintain her latanoprost, albuterol, aspirin, carvedilol, SitaGLIPtin-MetFORMIN HCl, Insulin Glargine, Insulin Pen Needle, gabapentin, losartan-hydrochlorothiazide, amLODipine, omeprazole, and ALPRAZolam.  Meds ordered this encounter  Medications  . glucose blood (FREESTYLE TEST STRIPS) test strip    Sig: TEST BLOOD SUGAR UP TO THREE TIMES DAILY.    Dispense:  100 each    Refill:  11  . omeprazole (PRILOSEC) 40 MG capsule    Sig: Take 1 capsule (40 mg total) by mouth  daily.    Dispense:  90 capsule    Refill:  3  . atorvastatin (LIPITOR) 20 MG tablet    Sig: Take 1 tablet (20 mg total) by mouth daily.    Dispense:  90 tablet    Refill:  3  . ALPRAZolam (XANAX) 0.25 MG tablet    Sig: Take 1 tablet (0.25 mg total) by mouth 2 (two) times daily as needed for anxiety.    Dispense:  75 tablet    Refill:  5  . oxyCODONE-acetaminophen (PERCOCET) 7.5-325 MG per tablet    Sig: Take 1 tablet by mouth every 8 (eight) hours as needed.    Dispense:  75 tablet    Refill:  0     Follow-up: Return in about 4 months (around 01/04/2016).  [name], MD

## 2015-09-04 NOTE — Progress Notes (Signed)
Pre visit review using our clinic review tool, if applicable. No additional management support is needed unless otherwise documented below in the visit note. 

## 2015-10-29 ENCOUNTER — Other Ambulatory Visit: Payer: Self-pay | Admitting: Internal Medicine

## 2015-12-25 ENCOUNTER — Encounter: Payer: Self-pay | Admitting: Internal Medicine

## 2015-12-25 ENCOUNTER — Other Ambulatory Visit (INDEPENDENT_AMBULATORY_CARE_PROVIDER_SITE_OTHER): Payer: PPO

## 2015-12-25 ENCOUNTER — Ambulatory Visit (INDEPENDENT_AMBULATORY_CARE_PROVIDER_SITE_OTHER): Payer: PPO | Admitting: Internal Medicine

## 2015-12-25 VITALS — BP 130/80 | HR 84 | Temp 98.1°F | Resp 16 | Ht 62.0 in | Wt 274.0 lb

## 2015-12-25 DIAGNOSIS — E785 Hyperlipidemia, unspecified: Secondary | ICD-10-CM

## 2015-12-25 DIAGNOSIS — E118 Type 2 diabetes mellitus with unspecified complications: Secondary | ICD-10-CM | POA: Diagnosis not present

## 2015-12-25 DIAGNOSIS — I10 Essential (primary) hypertension: Secondary | ICD-10-CM | POA: Diagnosis not present

## 2015-12-25 DIAGNOSIS — M4806 Spinal stenosis, lumbar region: Secondary | ICD-10-CM

## 2015-12-25 DIAGNOSIS — E038 Other specified hypothyroidism: Secondary | ICD-10-CM | POA: Diagnosis not present

## 2015-12-25 DIAGNOSIS — Z794 Long term (current) use of insulin: Secondary | ICD-10-CM | POA: Diagnosis not present

## 2015-12-25 DIAGNOSIS — M48061 Spinal stenosis, lumbar region without neurogenic claudication: Secondary | ICD-10-CM

## 2015-12-25 LAB — BASIC METABOLIC PANEL
BUN: 12 mg/dL (ref 6–23)
CO2: 30 mEq/L (ref 19–32)
Calcium: 9.8 mg/dL (ref 8.4–10.5)
Chloride: 102 mEq/L (ref 96–112)
Creatinine, Ser: 0.73 mg/dL (ref 0.40–1.20)
GFR: 102.02 mL/min (ref >60.00)
Glucose, Bld: 145 mg/dL — ABNORMAL HIGH (ref 70–99)
Potassium: 3.8 mEq/L (ref 3.5–5.1)
Sodium: 141 mEq/L (ref 135–145)

## 2015-12-25 LAB — LIPID PANEL
Cholesterol: 137 mg/dL (ref 0–200)
HDL: 48.4 mg/dL (ref >39.00)
NonHDL: 89.08
Total CHOL/HDL Ratio: 3
Triglycerides: 261 mg/dL — ABNORMAL HIGH (ref 0.0–149.0)
VLDL: 52.2 mg/dL — ABNORMAL HIGH (ref 0.0–40.0)

## 2015-12-25 LAB — CBC WITH DIFFERENTIAL/PLATELET
Basophils Absolute: 0.1 10*3/uL (ref 0.0–0.1)
Basophils Relative: 0.6 % (ref 0.0–3.0)
Eosinophils Absolute: 0.3 10*3/uL (ref 0.0–0.7)
Eosinophils Relative: 2.4 % (ref 0.0–5.0)
HCT: 36.7 % (ref 36.0–46.0)
Hemoglobin: 12 g/dL (ref 12.0–15.0)
Lymphocytes Relative: 20.2 % (ref 12.0–46.0)
Lymphs Abs: 2.8 10*3/uL (ref 0.7–4.0)
MCHC: 32.6 g/dL (ref 30.0–36.0)
MCV: 88 fl (ref 78.0–100.0)
Monocytes Absolute: 1.1 10*3/uL — ABNORMAL HIGH (ref 0.1–1.0)
Monocytes Relative: 8.1 % (ref 3.0–12.0)
Neutro Abs: 9.4 10*3/uL — ABNORMAL HIGH (ref 1.4–7.7)
Neutrophils Relative %: 68.7 % (ref 43.0–77.0)
Platelets: 326 10*3/uL (ref 150.0–400.0)
RBC: 4.18 Mil/uL (ref 3.87–5.11)
RDW: 15.6 % — ABNORMAL HIGH (ref 11.5–15.5)
WBC: 13.6 10*3/uL — ABNORMAL HIGH (ref 4.0–10.5)

## 2015-12-25 LAB — TSH: TSH: 1.77 u[IU]/mL (ref 0.35–4.50)

## 2015-12-25 LAB — URINALYSIS, ROUTINE W REFLEX MICROSCOPIC
Bilirubin Urine: NEGATIVE
Hgb urine dipstick: NEGATIVE
Ketones, ur: NEGATIVE
Leukocytes, UA: NEGATIVE
Nitrite: NEGATIVE
RBC / HPF: NONE SEEN (ref 0–?)
Specific Gravity, Urine: 1.02 (ref 1.000–1.030)
Total Protein, Urine: NEGATIVE
Urine Glucose: NEGATIVE
Urobilinogen, UA: 0.2 (ref 0.0–1.0)
WBC, UA: NONE SEEN (ref 0–?)
pH: 6 (ref 5.0–8.0)

## 2015-12-25 LAB — MICROALBUMIN / CREATININE URINE RATIO
Creatinine,U: 118.9 mg/dL
Microalb Creat Ratio: 4.9 mg/g (ref 0.0–30.0)
Microalb, Ur: 5.8 mg/dL — ABNORMAL HIGH (ref 0.0–1.9)

## 2015-12-25 LAB — HEMOGLOBIN A1C: Hgb A1c MFr Bld: 7.9 % — ABNORMAL HIGH (ref 4.6–6.5)

## 2015-12-25 LAB — LDL CHOLESTEROL, DIRECT: Direct LDL: 53 mg/dL

## 2015-12-25 MED ORDER — OXYCODONE-ACETAMINOPHEN 7.5-325 MG PO TABS: 1.0000 | ORAL_TABLET | Freq: Three times a day (TID) | ORAL | Status: DC | PRN

## 2015-12-25 MED ORDER — METHYLPREDNISOLONE ACETATE 80 MG/ML IJ SUSP
120.0000 mg | Freq: Once | INTRAMUSCULAR | Status: AC
  Administered 2015-12-25: 120 mg via INTRAMUSCULAR

## 2015-12-25 MED ORDER — SITAGLIP PHOS-METFORMIN HCL ER 100-1000 MG PO TB24: 1.0000 | ORAL_TABLET | Freq: Every day | ORAL | Status: DC

## 2015-12-25 NOTE — Progress Notes (Signed)
Pre visit review using our clinic review tool, if applicable. No additional management support is needed unless otherwise documented below in the visit note. 

## 2015-12-25 NOTE — Progress Notes (Signed)
Subjective:  Patient ID: Valerie [name], female    DOB: 12-31-1945  Age: 70 y.o. MRN: [id number]  CC: Back Pain; Hypertension; Hyperlipidemia; and Diabetes   HPI Valerie [name] presents for a 2 week history of right lower back pain. She tells me that she was moving a mattress in her house and felt like she strained her lower back. She has since had a stabbing pain that radiates into her right thigh. She is not experiencing any numbness, weakness,or tingling in her legs. She has taken a few doses of Percocet and it helps the pain but the pain has persisted. She also complains that her blood sugars have been high because she has not been able to afford the price of Janumet.  Outpatient Prescriptions Prior to Visit  Medication Sig Dispense Refill  . albuterol (VENTOLIN HFA) 108 (90 BASE) MCG/ACT inhaler Inhale 2 puffs into the lungs every 6 (six) hours as needed. For shortness of breath 3 Inhaler 4  . ALPRAZolam (XANAX) 0.25 MG tablet Take 1 tablet (0.25 mg total) by mouth 2 (two) times daily as needed for anxiety. 75 tablet 5  . amLODipine (NORVASC) 10 MG tablet TAKE ONE TABLET BY MOUTH ONCE DAILY 90 tablet 1  . aspirin 81 MG tablet Take 81 mg by mouth daily.    [name] atorvastatin (LIPITOR) 20 MG tablet Take 1 tablet (20 mg total) by mouth daily. 90 tablet 3  . carvedilol (COREG) 25 MG tablet TAKE ONE TABLET BY MOUTH TWICE DAILY WITH MEALS 180 tablet 3  . gabapentin (NEURONTIN) 300 MG capsule TAKE ONE CAPSULE BY MOUTH THREE TIMES DAILY 90 capsule 11  . glucose blood (FREESTYLE TEST STRIPS) test strip TEST BLOOD SUGAR UP TO THREE TIMES DAILY. 100 each 11  . Insulin Glargine (TOUJEO SOLOSTAR) 300 UNIT/ML SOPN Inject 30 Units into the skin daily. 1.5 mL 11  . Insulin Pen Needle 31G X 5 MM MISC Use once daily with insulin 100 each 3  . latanoprost (XALATAN) 0.005 % ophthalmic solution Place 1 drop into both eyes at bedtime.    [name] losartan-hydrochlorothiazide (HYZAAR) 100-12.5 MG per tablet Take 1  tablet by mouth daily. 90 tablet 3  . omeprazole (PRILOSEC) 40 MG capsule Take 1 capsule (40 mg total) by mouth daily. 90 capsule 3  . oxyCODONE-acetaminophen (PERCOCET) 7.5-325 MG per tablet Take 1 tablet by mouth every 8 (eight) hours as needed. 75 tablet 0  . SitaGLIPtin-MetFORMIN HCl (JANUMET XR) [phone] MG TB24 Take 1 tablet by mouth daily. (Patient not taking: Reported on 12/25/2015) 90 tablet 3   No facility-administered medications prior to visit.    ROS Review of Systems  Constitutional: Negative.  Negative for fever, chills, diaphoresis, appetite change and fatigue.  HENT: Negative.   Eyes: Negative.  Negative for visual disturbance.  Respiratory: Negative.  Negative for cough, choking, chest tightness, shortness of breath, wheezing and stridor.   Cardiovascular: Negative.  Negative for chest pain, palpitations and leg swelling.  Gastrointestinal: Negative.  Negative for nausea, vomiting, abdominal pain, diarrhea, constipation and blood in stool.  Endocrine: Negative.  Negative for polydipsia, polyphagia and polyuria.  Genitourinary: Negative.  Negative for dysuria, urgency, hematuria, flank pain and difficulty urinating.  Musculoskeletal: Positive for back pain. Negative for myalgias, joint swelling, arthralgias, gait problem, neck pain and neck stiffness.  Skin: Negative.  Negative for color change and rash.  Allergic/Immunologic: Negative.   Neurological: Negative.  Negative for dizziness, tremors, speech difficulty, weakness, light-headedness and headaches.  Hematological: Negative.  Negative for adenopathy. Does not bruise/bleed easily.  Psychiatric/Behavioral: Negative.     Objective:  BP 130/80 mmHg  Pulse 84  Temp(Src) 98.1 F (36.7 C) (Oral)  Resp 16  Ht 5\' 2"  (1.575 m)  Wt 274 lb (124.286 kg)  BMI 50.10 kg/m2  SpO2 98%  BP Readings from Last 3 Encounters:  12/25/15 130/80  09/04/15 128/78  08/30/15 137/64    Wt Readings from Last 3 Encounters:  12/25/15  274 lb (124.286 kg)  09/04/15 278 lb (126.1 kg)  08/29/15 279 lb 5.2 oz (126.7 kg)    Physical Exam  Constitutional: She is oriented to person, place, and time. She appears well-developed and well-nourished. No distress.  HENT:  Nose: Nose normal.  Mouth/Throat: Oropharynx is clear and moist.  Eyes: Conjunctivae are normal. Right eye exhibits no discharge. Left eye exhibits no discharge. No scleral icterus.  Neck: Normal range of motion. Neck supple. No JVD present. No tracheal deviation present. No thyromegaly present.  Cardiovascular: Normal rate, regular rhythm, normal heart sounds and intact distal pulses.  Exam reveals no gallop and no friction rub.   No murmur heard. Pulmonary/Chest: Effort normal and breath sounds normal. No stridor. No respiratory distress. She has no wheezes. She has no rales. She exhibits no tenderness.  Abdominal: Soft. Bowel sounds are normal. She exhibits no distension. There is no tenderness. There is no rebound and no guarding.  Musculoskeletal: Normal range of motion. She exhibits no edema or tenderness.  Lymphadenopathy:    She has no cervical adenopathy.  Neurological: She is oriented to person, place, and time. She has normal strength. She displays no atrophy, no tremor and normal reflexes. No cranial nerve deficit or sensory deficit. She exhibits normal muscle tone. She displays a negative Romberg sign. She displays no seizure activity. Coordination and gait normal.  Reflex Scores:      Tricep reflexes are 1+ on the right side and 1+ on the left side.      Bicep reflexes are 1+ on the right side and 1+ on the left side.      Brachioradialis reflexes are 1+ on the right side and 1+ on the left side.      Patellar reflexes are 1+ on the right side and 1+ on the left side.      Achilles reflexes are 1+ on the right side and 1+ on the left side. Neg SLR in BLE  Skin: Skin is warm and dry. No rash noted. She is not diaphoretic. No erythema. No pallor.    Vitals reviewed.   Lab Results  Component Value Date   WBC 13.6* 12/25/2015   HGB 12.0 12/25/2015   HCT 36.7 12/25/2015   PLT 326.0 12/25/2015   GLUCOSE 145* 12/25/2015   CHOL 137 12/25/2015   TRIG 261.0* 12/25/2015   HDL 48.40 12/25/2015   LDLDIRECT 53.0 12/25/2015   LDLCALC 43 03/28/2014   ALT 27 08/29/2015   AST 33 08/29/2015   NA 141 12/25/2015   K 3.8 12/25/2015   CL 102 12/25/2015   CREATININE 0.73 12/25/2015   BUN 12 12/25/2015   CO2 30 12/25/2015   TSH 1.77 12/25/2015   INR 1.06 08/29/2015   HGBA1C 7.9* 12/25/2015   MICROALBUR 5.8* 12/25/2015    Ct Angio Chest Pe W/cm &/or Wo Cm  08/29/2015  CLINICAL DATA:  Chest pain since last night EXAM: CT ANGIOGRAPHY CHEST WITH CONTRAST TECHNIQUE: Multidetector CT imaging of the chest was performed using the standard protocol during bolus administration  of intravenous contrast. Multiplanar CT image reconstructions and MIPs were obtained to evaluate the vascular anatomy. CONTRAST:  35mL OMNIPAQUE IOHEXOL 350 MG/ML SOLN COMPARISON:  07/10/2008 FINDINGS: No evidence of acute pulmonary thromboembolism No evidence of aortic dissection. Circumflex coronary artery distribution calcifications noted No evidence of abnormal mediastinal adenopathy. No pneumothorax.  No pleural effusion. Lungs clear. No fractures. Small hiatal hernia. Review of the MIP images confirms the above findings. IMPRESSION: No evidence of pulmonary thromboembolism. Electronically Signed   By: Marybelle Killings M.D.   On: 08/29/2015 13:35   Nm Myocar Multi W/spect W/wall Motion / Ef  08/30/2015   The study is normal.  This is a low risk study.  The left ventricular ejection fraction is hyperdynamic (>65%).  Normal resting and stress perfusion EF 69%   Dg Chest Port 1 View  08/29/2015  CLINICAL DATA:  Chest pain since last night. EXAM: PORTABLE CHEST - 1 VIEW COMPARISON:  02/27/2013 FINDINGS: Cardiomediastinal silhouette is enlarged given for portable technique.  Mediastinal contours appear intact. Atherosclerotic calcifications of the aorta noted. There is no evidence of pneumothorax. Left costophrenic angle is obscured Athena Masse which may be due to pleural effusion with associated left lower lobe atelectasis. Large skin fold however overlies the lower thorax, and these findings may be partially artifactual. Osseous structures are without acute abnormality. Soft tissues are grossly normal. IMPRESSION: Possible left pleural effusion with lower lobe atelectasis. These findings may be exaggerated by a large skin fold overlying the lower thorax. Repeated PA and lateral chest radiography may be considered. Enlarged cardiac silhouette. Electronically Signed   By: Fidela Salisbury M.D.   On: 08/29/2015 08:18    Assessment & Plan:   Eliese was seen today for back pain, hypertension, hyperlipidemia and diabetes.  Diagnoses and all orders for this visit:  Spinal stenosis of lumbar 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- she has a 2 to three-week history of right lower back pain with a history of spinal stenosis, I think she is currently dealing with a disc herniation but there is no evidence of radiculitis. The reduce her pain I have given her an injection of Depo-Medrol. She'll also continue Percocet as needed for pain. -     Discontinue: oxyCODONE-acetaminophen (PERCOCET) 7.5-325 MG tablet; Take 1 tablet by mouth every 8 (eight) hours as needed. -     Discontinue: oxyCODONE-acetaminophen (PERCOCET) 7.5-325 MG tablet; Take 1 tablet by mouth every 8 (eight) hours as needed. -     oxyCODONE-acetaminophen (PERCOCET) 7.5-325 MG tablet; Take 1 tablet by mouth every 8 (eight) hours as needed. -     methylPREDNISolone acetate (DEPO-MEDROL) injection 120 mg; Inject 1.5 mLs (120 mg total) into the muscle once.  Type 2 diabetes mellitus with complication, with long-term current use of insulin ([place])- her blood sugars are not well-controlled, I gave her samples of Janumet to help her get  restarted. She is also due for her annual eye exam. -     SitaGLIPtin-MetFORMIN HCl (JANUMET XR) [phone] MG TB24; Take 1 tablet by mouth daily. -     Hemoglobin A1c; Future -     Microalbumin / creatinine urine ratio; Future -     Ambulatory referral to Ophthalmology  Essential hypertension, benign- her blood pressure is well-controlled, electrolytes and renal function are stable. -     Basic metabolic panel; Future -     Urinalysis, Routine w reflex microscopic (not at [facility]); Future  Other specified hypothyroidism- her TSH is in the normal range, she will remain on  the current dose of Synthroid. -     CBC with Differential/Platelet; Future -     TSH; Future  Hyperlipidemia with target LDL less than 100- she has achieved her LDL goal is doing well on the statin. -     Lipid panel; Future  Other orders -     Cancel: DME Other see comment   I am having Ms. Quattrone maintain her latanoprost, albuterol, aspirin, Insulin Glargine, Insulin Pen Needle, gabapentin, losartan-hydrochlorothiazide, amLODipine, glucose blood, omeprazole, atorvastatin, ALPRAZolam, carvedilol, SitaGLIPtin-MetFORMIN HCl, and oxyCODONE-acetaminophen. We administered methylPREDNISolone acetate.  Meds ordered this encounter  Medications  . SitaGLIPtin-MetFORMIN HCl (JANUMET XR) [phone] MG TB24    Sig: Take 1 tablet by mouth daily.    Dispense:  90 tablet    Refill:  3  . DISCONTD: oxyCODONE-acetaminophen (PERCOCET) 7.5-325 MG tablet    Sig: Take 1 tablet by mouth every 8 (eight) hours as needed.    Dispense:  75 tablet    Refill:  0  . DISCONTD: oxyCODONE-acetaminophen (PERCOCET) 7.5-325 MG tablet    Sig: Take 1 tablet by mouth every 8 (eight) hours as needed.    Dispense:  75 tablet    Refill:  0  . oxyCODONE-acetaminophen (PERCOCET) 7.5-325 MG tablet    Sig: Take 1 tablet by mouth every 8 (eight) hours as needed.    Dispense:  75 tablet    Refill:  0  . methylPREDNISolone acetate (DEPO-MEDROL) injection 120 mg     Sig:      Follow-up: Return in about 4 months (around 04/23/2016).  [name], MD

## 2015-12-25 NOTE — Patient Instructions (Signed)

## 2016-01-01 DIAGNOSIS — G4733 Obstructive sleep apnea (adult) (pediatric): Secondary | ICD-10-CM | POA: Diagnosis not present

## 2016-01-14 ENCOUNTER — Other Ambulatory Visit: Payer: Self-pay | Admitting: Internal Medicine

## 2016-01-22 DIAGNOSIS — E119 Type 2 diabetes mellitus without complications: Secondary | ICD-10-CM | POA: Diagnosis not present

## 2016-01-22 DIAGNOSIS — H04123 Dry eye syndrome of bilateral lacrimal glands: Secondary | ICD-10-CM | POA: Diagnosis not present

## 2016-01-22 DIAGNOSIS — Z961 Presence of intraocular lens: Secondary | ICD-10-CM | POA: Diagnosis not present

## 2016-01-22 DIAGNOSIS — H25811 Combined forms of age-related cataract, right eye: Secondary | ICD-10-CM | POA: Diagnosis not present

## 2016-01-22 DIAGNOSIS — H40013 Open angle with borderline findings, low risk, bilateral: Secondary | ICD-10-CM | POA: Diagnosis not present

## 2016-01-22 LAB — HM DIABETES EYE EXAM

## 2016-01-31 DIAGNOSIS — G4733 Obstructive sleep apnea (adult) (pediatric): Secondary | ICD-10-CM | POA: Diagnosis not present

## 2016-02-28 ENCOUNTER — Telehealth: Payer: Self-pay | Admitting: Internal Medicine

## 2016-02-28 NOTE — Telephone Encounter (Signed)
[facility]  Patient Name: Valerie [name]  DOB: November 05, 1946    Initial Comment Caller states, wants an appt, has not felt good a few days, sweating, chest hurts    Nurse Assessment  Nurse: Wayne Sever, RN, Tillie Rung Date/Time (Eastern Time): 02/28/2016 12:53:56 PM  Confirm and document reason for call. If symptomatic, describe symptoms. You must click the next button to save text entered. ---Caller states she has had chest pain for a few days. She states she is flushed and fatigued. She is sweating on and off  Has the patient traveled out of the country within the last 30 days? ---Not Applicable  Does the patient have any new or worsening symptoms? ---Yes  Will a triage be completed? ---Yes  Related visit to physician within the last 2 weeks? ---No  Does the PT have any chronic conditions? (i.e. diabetes, asthma, etc.) ---Yes  List chronic conditions. ---Obese, Diabetic, Asthma  Is this a behavioral health or substance abuse call? ---No     Guidelines    Guideline Title Affirmed Question Affirmed Notes  Chest Pain [1] Intermittent chest pain or "angina" AND [2] increasing in severity or frequency (Exception: pains lasting a few seconds)    Final Disposition User   Go to ED Now Wayne Sever, RN, Tillie Rung    Comments  Caller refused ED outcome. Stated she was not going to sit in ER all day. Told her I would notify office that she was not going to ED. She wanted appointment next week, but I told her that I could not do that as she needed to go to ED. She still refused and then she hung up on me  Spoke with Colletta Maryland in office about patient refusing ED outcome. She will notify the MD in office   Referrals  [facility] REFUSED   Disagree/Comply: Disagree  Disagree/Comply Reason: Disagree with instructions

## 2016-02-28 NOTE — Telephone Encounter (Signed)
Ok to schedule this Saturday or early next week

## 2016-02-28 NOTE — Telephone Encounter (Signed)
Please advise. PCP out of office

## 2016-02-28 NOTE — Telephone Encounter (Signed)
Spoke with pt. Stated she feels better that she did earler. Denied sweats or chills. Offered Saturday clinc appt denied due to husband dialysis appt offered another apt with different provider on Monday Pt refused. She will see Dr. Ronnald Ramp on thrursday 3/22 at 2:30

## 2016-03-02 ENCOUNTER — Other Ambulatory Visit: Payer: Self-pay | Admitting: Internal Medicine

## 2016-03-02 DIAGNOSIS — G4733 Obstructive sleep apnea (adult) (pediatric): Secondary | ICD-10-CM | POA: Diagnosis not present

## 2016-03-05 ENCOUNTER — Ambulatory Visit: Payer: PPO | Admitting: Internal Medicine

## 2016-03-09 ENCOUNTER — Encounter: Payer: Self-pay | Admitting: Internal Medicine

## 2016-03-09 ENCOUNTER — Ambulatory Visit (INDEPENDENT_AMBULATORY_CARE_PROVIDER_SITE_OTHER): Payer: PPO | Admitting: Internal Medicine

## 2016-03-09 VITALS — BP 118/80 | HR 88 | Temp 97.8°F | Resp 16 | Ht 62.0 in | Wt 275.0 lb

## 2016-03-09 DIAGNOSIS — F418 Other specified anxiety disorders: Secondary | ICD-10-CM

## 2016-03-09 DIAGNOSIS — R079 Chest pain, unspecified: Secondary | ICD-10-CM | POA: Diagnosis not present

## 2016-03-09 MED ORDER — SERTRALINE HCL 50 MG PO TABS: 50.0000 mg | ORAL_TABLET | Freq: Every day | ORAL | Status: DC

## 2016-03-09 NOTE — Patient Instructions (Signed)

## 2016-03-09 NOTE — Progress Notes (Signed)
Pre visit review using our clinic review tool, if applicable. No additional management support is needed unless otherwise documented below in the visit note. 

## 2016-03-09 NOTE — Progress Notes (Signed)
Subjective:  Patient ID: Valerie [name], female    DOB: 1946/12/02  Age: 70 y.o. MRN: [id number]  CC: Hypertension   HPI Valerie [name] presents for a 3 week history of feeling overwhelmed. She tells me her husband is very sick and is requiring a lot of attention from her. She complains of worsening anxiety with panic attacks. She has had a few episodes of laryngitis and feels like she is having trouble swallowing. A few days ago she had one episode of nausea and vomiting but none since then. She complains of shortness of breath and diaphoresis at rest when she feels anxious. She denies palpitations, syncope, or edema. She complains of fatigue and a few episodes of chest pain at rest that she describes as a jolting sensation in her chest and a few sensations of feeling like someone has punched her in the sternum.  Outpatient Prescriptions Prior to Visit  Medication Sig Dispense Refill  . albuterol (VENTOLIN HFA) 108 (90 BASE) MCG/ACT inhaler Inhale 2 puffs into the lungs every 6 (six) hours as needed. For shortness of breath 3 Inhaler 4  . ALPRAZolam (XANAX) 0.25 MG tablet Take 1 tablet (0.25 mg total) by mouth 2 (two) times daily as needed for anxiety. 75 tablet 5  . amLODipine (NORVASC) 10 MG tablet TAKE ONE TABLET BY MOUTH ONCE DAILY 90 tablet 1  . aspirin 81 MG tablet Take 81 mg by mouth daily.    [name] atorvastatin (LIPITOR) 20 MG tablet Take 1 tablet (20 mg total) by mouth daily. 90 tablet 3  . carvedilol (COREG) 25 MG tablet TAKE ONE TABLET BY MOUTH TWICE DAILY WITH MEALS 180 tablet 3  . gabapentin (NEURONTIN) 300 MG capsule TAKE ONE CAPSULE BY MOUTH THREE TIMES DAILY 90 capsule 3  . glucose blood (FREESTYLE TEST STRIPS) test strip TEST BLOOD SUGAR UP TO THREE TIMES DAILY. 100 each 11  . Insulin Glargine (TOUJEO SOLOSTAR) 300 UNIT/ML SOPN Inject 30 Units into the skin daily. 1.5 mL 11  . Insulin Pen Needle 31G X 5 MM MISC Use once daily with insulin 100 each 3  . latanoprost (XALATAN)  0.005 % ophthalmic solution Place 1 drop into both eyes at bedtime.    [name] losartan-hydrochlorothiazide (HYZAAR) 100-12.5 MG tablet TAKE ONE TABLET BY MOUTH ONCE DAILY 90 tablet 1  . omeprazole (PRILOSEC) 40 MG capsule Take 1 capsule (40 mg total) by mouth daily. 90 capsule 3  . SitaGLIPtin-MetFORMIN HCl (JANUMET XR) [phone] MG TB24 Take 1 tablet by mouth daily. 90 tablet 3  . oxyCODONE-acetaminophen (PERCOCET) 7.5-325 MG tablet Take 1 tablet by mouth every 8 (eight) hours as needed. 75 tablet 0   No facility-administered medications prior to visit.    ROS Review of Systems  Constitutional: Positive for diaphoresis and fatigue. Negative for fever, chills, activity change, appetite change and unexpected weight change.  HENT: Positive for voice change. Negative for trouble swallowing.   Eyes: Negative.   Respiratory: Positive for choking and shortness of breath. Negative for apnea, cough, chest tightness, wheezing and stridor.   Cardiovascular: Positive for chest pain. Negative for palpitations and leg swelling.  Gastrointestinal: Negative.  Negative for nausea, vomiting, abdominal pain, diarrhea, constipation and blood in stool.  Endocrine: Negative.   Genitourinary: Negative.   Musculoskeletal: Negative.  Negative for myalgias, back pain, arthralgias and neck pain.  Skin: Negative.  Negative for color change and rash.  Allergic/Immunologic: Negative.   Neurological: Negative.  Negative for dizziness, syncope, speech difficulty, weakness, light-headedness, numbness  and headaches.  Hematological: Negative.  Negative for adenopathy. Does not bruise/bleed easily.  Psychiatric/Behavioral: Positive for dysphoric mood. Negative for suicidal ideas, hallucinations, behavioral problems, confusion, sleep disturbance, self-injury, decreased concentration and agitation. The patient is nervous/anxious. The patient is not hyperactive.     Objective:  BP 118/80 mmHg  Pulse 88  Temp(Src) 97.8 F (36.6  C) (Oral)  Resp 16  Ht 5\' 2"  (1.575 m)  Wt 275 lb (124.739 kg)  BMI 50.29 kg/m2  SpO2 98%  BP Readings from Last 3 Encounters:  03/09/16 118/80  12/25/15 130/80  09/04/15 128/78    Wt Readings from Last 3 Encounters:  03/09/16 275 lb (124.739 kg)  12/25/15 274 lb (124.286 kg)  09/04/15 278 lb (126.1 kg)    Physical Exam  Constitutional: She is oriented to person, place, and time. She appears well-developed and well-nourished. No distress.  HENT:  Head: Normocephalic and atraumatic.  Mouth/Throat: Oropharynx is clear and moist. No oropharyngeal exudate.  Eyes: Conjunctivae are normal. Right eye exhibits no discharge. Left eye exhibits no discharge. No scleral icterus.  Neck: Normal range of motion. Neck supple. No JVD present. No tracheal deviation present. No thyromegaly present.  Cardiovascular: Normal rate, regular rhythm, normal heart sounds and intact distal pulses.  Exam reveals no gallop and no friction rub.   No murmur heard. EKG ---  Sinus  Rhythm  WITHIN NORMAL LIMITS  Pulmonary/Chest: Effort normal and breath sounds normal. No stridor. No respiratory distress. She has no wheezes. She has no rales. She exhibits no tenderness.  Abdominal: Soft. Bowel sounds are normal. She exhibits no distension and no mass. There is no tenderness. There is no rebound and no guarding.  Musculoskeletal: Normal range of motion. She exhibits no edema or tenderness.  Lymphadenopathy:    She has no cervical adenopathy.  Neurological: She is oriented to person, place, and time.  Skin: Skin is warm and dry. No rash noted. She is not diaphoretic. No erythema. No pallor.  Psychiatric: Her speech is normal and behavior is normal. Judgment and thought content normal. Her mood appears anxious. Her affect is not angry, not blunt, not labile and not inappropriate. Cognition and memory are normal. She does not exhibit a depressed mood.  Vitals reviewed.   Lab Results  Component Value Date   WBC  13.6* 12/25/2015   HGB 12.0 12/25/2015   HCT 36.7 12/25/2015   PLT 326.0 12/25/2015   GLUCOSE 145* 12/25/2015   CHOL 137 12/25/2015   TRIG 261.0* 12/25/2015   HDL 48.40 12/25/2015   LDLDIRECT 53.0 12/25/2015   LDLCALC 43 03/28/2014   ALT 27 08/29/2015   AST 33 08/29/2015   NA 141 12/25/2015   K 3.8 12/25/2015   CL 102 12/25/2015   CREATININE 0.73 12/25/2015   BUN 12 12/25/2015   CO2 30 12/25/2015   TSH 1.77 12/25/2015   INR 1.06 08/29/2015   HGBA1C 7.9* 12/25/2015   MICROALBUR 5.8* 12/25/2015    Ct Angio Chest Pe W/cm &/or Wo Cm  08/29/2015  CLINICAL DATA:  Chest pain since last night EXAM: CT ANGIOGRAPHY CHEST WITH CONTRAST TECHNIQUE: Multidetector CT imaging of the chest was performed using the standard protocol during bolus administration of intravenous contrast. Multiplanar CT image reconstructions and MIPs were obtained to evaluate the vascular anatomy. CONTRAST:  46mL OMNIPAQUE IOHEXOL 350 MG/ML SOLN COMPARISON:  07/10/2008 FINDINGS: No evidence of acute pulmonary thromboembolism No evidence of aortic dissection. Circumflex coronary artery distribution calcifications noted No evidence of abnormal mediastinal adenopathy.  No pneumothorax.  No pleural effusion. Lungs clear. No fractures. Small hiatal hernia. Review of the MIP images confirms the above findings. IMPRESSION: No evidence of pulmonary thromboembolism. Electronically Signed   By: Marybelle Killings M.D.   On: 08/29/2015 13:35   Nm Myocar Multi W/spect W/wall Motion / Ef  08/30/2015   The study is normal.  This is a low risk study.  The left ventricular ejection fraction is hyperdynamic (>65%).  Normal resting and stress perfusion EF 69%   Dg Chest Port 1 View  08/29/2015  CLINICAL DATA:  Chest pain since last night. EXAM: PORTABLE CHEST - 1 VIEW COMPARISON:  02/27/2013 FINDINGS: Cardiomediastinal silhouette is enlarged given for portable technique. Mediastinal contours appear intact. Atherosclerotic calcifications of the  aorta noted. There is no evidence of pneumothorax. Left costophrenic angle is obscured Valerie [name] which may be due to pleural effusion with associated left lower lobe atelectasis. Large skin fold however overlies the lower thorax, and these findings may be partially artifactual. Osseous structures are without acute abnormality. Soft tissues are grossly normal. IMPRESSION: Possible left pleural effusion with lower lobe atelectasis. These findings may be exaggerated by a large skin fold overlying the lower thorax. Repeated PA and lateral chest radiography may be considered. Enlarged cardiac silhouette. Electronically Signed   By: Fidela Salisbury M.D.   On: 08/29/2015 08:18    Assessment & Plan:   Valerie [name] was seen today for hypertension.  Diagnoses and all orders for this visit:  Chest pain, unspecified chest pain type- her chest pain does not sound anginal, her description is very atypical. In addition, she has had an extensive cardiovascular evaluation over the last few years with a cardiologist. Her EKG remains normal. I think her symptoms are related to panic and anxiety. She will continue Xanax and needed as needed and I will also add an SSRI -     EKG 12-Lead  Depression with anxiety- she will continue Xanax as needed and I have also asked her to start sertraline at 50 mg a day, I anticipate increasing this dose over the next few weeks and months. -     sertraline (ZOLOFT) 50 MG tablet; Take 1 tablet (50 mg total) by mouth daily.   I have discontinued Valerie [name]'s oxyCODONE-acetaminophen. I am also having her start on sertraline. Additionally, I am having her maintain her latanoprost, albuterol, aspirin, Insulin Glargine, Insulin Pen Needle, amLODipine, glucose blood, omeprazole, atorvastatin, ALPRAZolam, carvedilol, SitaGLIPtin-MetFORMIN HCl, losartan-hydrochlorothiazide, and gabapentin.  Meds ordered this encounter  Medications  . sertraline (ZOLOFT) 50 MG tablet    Sig: Take 1 tablet (50  mg total) by mouth daily.    Dispense:  30 tablet    Refill:  3     Follow-up: Return in about 3 months (around 06/09/2016).  [name], MD

## 2016-03-30 ENCOUNTER — Other Ambulatory Visit: Payer: Self-pay | Admitting: Internal Medicine

## 2016-03-30 NOTE — Telephone Encounter (Signed)
Pt called to check up on this, she is out of this medication. Please help

## 2016-03-30 NOTE — Telephone Encounter (Signed)
Pt following up on this request. She hasn't had insulin since Saturday

## 2016-04-02 DIAGNOSIS — G4733 Obstructive sleep apnea (adult) (pediatric): Secondary | ICD-10-CM | POA: Diagnosis not present

## 2016-04-07 DIAGNOSIS — J45902 Unspecified asthma with status asthmaticus: Secondary | ICD-10-CM | POA: Diagnosis not present

## 2016-04-09 ENCOUNTER — Ambulatory Visit: Payer: PPO | Admitting: Internal Medicine

## 2016-04-16 ENCOUNTER — Ambulatory Visit (INDEPENDENT_AMBULATORY_CARE_PROVIDER_SITE_OTHER): Payer: PPO | Admitting: Internal Medicine

## 2016-04-16 ENCOUNTER — Encounter: Payer: Self-pay | Admitting: Internal Medicine

## 2016-04-16 ENCOUNTER — Ambulatory Visit (INDEPENDENT_AMBULATORY_CARE_PROVIDER_SITE_OTHER)
Admission: RE | Admit: 2016-04-16 | Discharge: 2016-04-16 | Disposition: A | Discharge status: Home / Self Care | Payer: PPO | Source: Ambulatory Visit | Attending: Internal Medicine | Admitting: Internal Medicine

## 2016-04-16 VITALS — BP 124/80 | HR 89 | Temp 98.7°F | Resp 16 | Ht 62.0 in | Wt 273.0 lb

## 2016-04-16 DIAGNOSIS — R05 Cough: Secondary | ICD-10-CM | POA: Diagnosis not present

## 2016-04-16 DIAGNOSIS — J4521 Mild intermittent asthma with (acute) exacerbation: Secondary | ICD-10-CM | POA: Diagnosis not present

## 2016-04-16 DIAGNOSIS — J189 Pneumonia, unspecified organism: Secondary | ICD-10-CM | POA: Insufficient documentation

## 2016-04-16 MED ORDER — HYDROCODONE-HOMATROPINE 5-1.5 MG/5ML PO SYRP: 5.0000 mL | ORAL_SOLUTION | Freq: Three times a day (TID) | ORAL | Status: DC | PRN

## 2016-04-16 MED ORDER — FLUTICASONE FUROATE-VILANTEROL 200-25 MCG/INH IN AEPB: 1.0000 | INHALATION_SPRAY | Freq: Every day | RESPIRATORY_TRACT | Status: DC

## 2016-04-16 NOTE — Patient Instructions (Signed)

## 2016-04-16 NOTE — Progress Notes (Signed)
Pre visit review using our clinic review tool, if applicable. No additional management support is needed unless otherwise documented below in the visit note. 

## 2016-04-17 NOTE — Progress Notes (Signed)
Subjective:  Patient ID: Valerie [name], female    DOB: 09-26-1946  Age: 70 y.o. MRN: [id number]  CC: Cough and Asthma   HPI Valerie [name] presents for follow-up on cough. She states that she developed a cough about 2 weeks ago that was productive of thick yellow phlegm. She was seen at an outside urgent care center and was told  that she had pneumonia on the right side based on a CXR. She has completed a course of Levaquin. She tells me the cough is improving and is now nonproductive but she complains of shortness of breath, wheezing, chills, and night sweats. She has been using an albuterol inhaler for the wheezing is gotten some symptom relief. She is not currently using a steroid inhaler. She is also not been given a cough suppressant and says the cough is interfering with her activities and ability to sleep.  Outpatient Prescriptions Prior to Visit  Medication Sig Dispense Refill  . albuterol (VENTOLIN HFA) 108 (90 BASE) MCG/ACT inhaler Inhale 2 puffs into the lungs every 6 (six) hours as needed. For shortness of breath 3 Inhaler 4  . ALPRAZolam (XANAX) 0.25 MG tablet Take 1 tablet (0.25 mg total) by mouth 2 (two) times daily as needed for anxiety. 75 tablet 5  . amLODipine (NORVASC) 10 MG tablet TAKE ONE TABLET BY MOUTH ONCE DAILY 90 tablet 1  . aspirin 81 MG tablet Take 81 mg by mouth daily.    [name] atorvastatin (LIPITOR) 20 MG tablet Take 1 tablet (20 mg total) by mouth daily. 90 tablet 3  . carvedilol (COREG) 25 MG tablet TAKE ONE TABLET BY MOUTH TWICE DAILY WITH MEALS 180 tablet 3  . gabapentin (NEURONTIN) 300 MG capsule TAKE ONE CAPSULE BY MOUTH THREE TIMES DAILY 90 capsule 3  . glucose blood (FREESTYLE TEST STRIPS) test strip TEST BLOOD SUGAR UP TO THREE TIMES DAILY. 100 each 11  . Insulin Pen Needle 31G X 5 MM MISC Use once daily with insulin 100 each 3  . latanoprost (XALATAN) 0.005 % ophthalmic solution Place 1 drop into both eyes at bedtime.    [name] losartan-hydrochlorothiazide  (HYZAAR) 100-12.5 MG tablet TAKE ONE TABLET BY MOUTH ONCE DAILY 90 tablet 1  . omeprazole (PRILOSEC) 40 MG capsule Take 1 capsule (40 mg total) by mouth daily. 90 capsule 3  . sertraline (ZOLOFT) 50 MG tablet Take 1 tablet (50 mg total) by mouth daily. 30 tablet 3  . SitaGLIPtin-MetFORMIN HCl (JANUMET XR) [phone] MG TB24 Take 1 tablet by mouth daily. 90 tablet 3  . TOUJEO SOLOSTAR 300 UNIT/ML SOPN INJECT 30 UNITS SUBCUTANEOUSLY ONCE DAILY 6 pen 11   No facility-administered medications prior to visit.    ROS Review of Systems  Constitutional: Positive for chills. Negative for fever, diaphoresis, appetite change and fatigue.  HENT: Negative.  Negative for congestion, facial swelling, sinus pressure, sore throat and tinnitus.   Eyes: Negative.   Respiratory: Positive for cough, shortness of breath and wheezing. Negative for apnea, choking, chest tightness and stridor.   Cardiovascular: Negative.  Negative for chest pain, palpitations and leg swelling.  Gastrointestinal: Negative.  Negative for nausea, vomiting, abdominal pain, diarrhea and constipation.  Endocrine: Negative.   Genitourinary: Negative.   Musculoskeletal: Negative.   Skin: Negative.  Negative for color change and rash.  Allergic/Immunologic: Negative.   Neurological: Negative.   Hematological: Negative.  Negative for adenopathy. Does not bruise/bleed easily.  Psychiatric/Behavioral: Negative.     Objective:  BP 124/80 mmHg  Pulse  89  Temp(Src) 98.7 F (37.1 C) (Oral)  Resp 16  Ht 5\' 2"  (1.575 m)  Wt 273 lb (123.832 kg)  BMI 49.92 kg/m2  SpO2 98%  BP Readings from Last 3 Encounters:  04/16/16 124/80  03/09/16 118/80  12/25/15 130/80    Wt Readings from Last 3 Encounters:  04/16/16 273 lb (123.832 kg)  03/09/16 275 lb (124.739 kg)  12/25/15 274 lb (124.286 kg)    Physical Exam  Constitutional: She is oriented to person, place, and time.  Non-toxic appearance. She does not have a sickly appearance. She  appears ill. No distress.  Intermittent coughing and audible wheezing  HENT:  Mouth/Throat: Oropharynx is clear and moist. No oropharyngeal exudate.  Eyes: Conjunctivae are normal. Right eye exhibits no discharge. Left eye exhibits no discharge. No scleral icterus.  Neck: Normal range of motion. Neck supple. No JVD present. No tracheal deviation present. No thyromegaly present.  Cardiovascular: Normal rate, regular rhythm, normal heart sounds and intact distal pulses.  Exam reveals no gallop and no friction rub.   No murmur heard. Pulmonary/Chest: Effort normal. No accessory muscle usage or stridor. No respiratory distress. She has no decreased breath sounds. She has wheezes in the right upper field and the left upper field. She has no rhonchi. She has no rales. She exhibits no tenderness.  There is late expiratory wheezing over both lobes anteriorly  Abdominal: Soft. Bowel sounds are normal. She exhibits no distension and no mass. There is no tenderness. There is no rebound and no guarding.  Musculoskeletal: Normal range of motion. She exhibits no edema or tenderness.  Lymphadenopathy:    She has no cervical adenopathy.  Neurological: She is oriented to person, place, and time.  Skin: Skin is warm and dry. No rash noted. She is not diaphoretic. No erythema. No pallor.  Psychiatric: She has a normal mood and affect. Her behavior is normal. Judgment and thought content normal.  Vitals reviewed.   Lab Results  Component Value Date   WBC 13.6* 12/25/2015   HGB 12.0 12/25/2015   HCT 36.7 12/25/2015   PLT 326.0 12/25/2015   GLUCOSE 145* 12/25/2015   CHOL 137 12/25/2015   TRIG 261.0* 12/25/2015   HDL 48.40 12/25/2015   LDLDIRECT 53.0 12/25/2015   LDLCALC 43 03/28/2014   ALT 27 08/29/2015   AST 33 08/29/2015   NA 141 12/25/2015   K 3.8 12/25/2015   CL 102 12/25/2015   CREATININE 0.73 12/25/2015   BUN 12 12/25/2015   CO2 30 12/25/2015   TSH 1.77 12/25/2015   INR 1.06 08/29/2015    HGBA1C 7.9* 12/25/2015   MICROALBUR 5.8* 12/25/2015    Dg Chest 2 View  04/16/2016  CLINICAL DATA:  Recent diagnosis of pneumonia. Coughing and wheezing and shortness of breath for 2 weeks. History of hypertension. EXAM: CHEST  2 VIEW COMPARISON:  08/29/2015 FINDINGS: Cardiac silhouette is top-normal in size. No mediastinal or hilar masses or evidence of adenopathy. Clear lungs.  No pleural effusion or pneumothorax. Bony thorax is intact. IMPRESSION: No active cardiopulmonary disease. Electronically Signed   By: Lajean Manes M.D.   On: 04/16/2016 15:36    Assessment & Plan:   Valerie [name] was seen today for cough and asthma.  Diagnoses and all orders for this visit:  Mild intermittent asthma, with acute exacerbation- she will continue using the albuterol inhaler as needed but I think she should also add an inhaled corticosteroid so I hace asked her start using Breo, 1 puff once a  day. I gave her a sample and showed her how to use it, she illustrated proficiency with its use. -     fluticasone furoate-vilanterol (BREO ELLIPTA) 200-25 MCG/INH AEPB; Inhale 1 puff into the lungs daily.  CAP (community acquired pneumonia)- her chest x-ray shows the pneumonia has resolved, she has completed the course of Levaquin, will offer Hycodan as needed for the persistent cough. -     DG Chest 2 View; Future -     HYDROcodone-homatropine (HYCODAN) 5-1.5 MG/5ML syrup; Take 5 mLs by mouth every 8 (eight) hours as needed for cough.   I am having Valerie [name] start on fluticasone furoate-vilanterol and HYDROcodone-homatropine. I am also having her maintain her latanoprost, albuterol, aspirin, Insulin Pen Needle, amLODipine, glucose blood, omeprazole, atorvastatin, ALPRAZolam, carvedilol, SitaGLIPtin-MetFORMIN HCl, losartan-hydrochlorothiazide, gabapentin, sertraline, TOUJEO SOLOSTAR, levofloxacin, and albuterol.  Meds ordered this encounter  Medications  . levofloxacin (LEVAQUIN) 750 MG tablet    Sig:   . albuterol  (PROVENTIL) (2.5 MG/3ML) 0.083% nebulizer solution    Sig:   . fluticasone furoate-vilanterol (BREO ELLIPTA) 200-25 MCG/INH AEPB    Sig: Inhale 1 puff into the lungs daily.    Dispense:  30 each    Refill:  11  . HYDROcodone-homatropine (HYCODAN) 5-1.5 MG/5ML syrup    Sig: Take 5 mLs by mouth every 8 (eight) hours as needed for cough.    Dispense:  120 mL    Refill:  0     Follow-up: Return in about 3 weeks (around 05/07/2016).  [name], MD

## 2016-04-29 ENCOUNTER — Other Ambulatory Visit (INDEPENDENT_AMBULATORY_CARE_PROVIDER_SITE_OTHER): Payer: PPO

## 2016-04-29 ENCOUNTER — Ambulatory Visit (INDEPENDENT_AMBULATORY_CARE_PROVIDER_SITE_OTHER): Payer: PPO | Admitting: Internal Medicine

## 2016-04-29 ENCOUNTER — Encounter: Payer: Self-pay | Admitting: Internal Medicine

## 2016-04-29 VITALS — BP 136/76 | HR 81 | Temp 98.1°F | Resp 16 | Ht 66.0 in | Wt 276.5 lb

## 2016-04-29 DIAGNOSIS — Z1231 Encounter for screening mammogram for malignant neoplasm of breast: Secondary | ICD-10-CM

## 2016-04-29 DIAGNOSIS — E038 Other specified hypothyroidism: Secondary | ICD-10-CM

## 2016-04-29 DIAGNOSIS — E118 Type 2 diabetes mellitus with unspecified complications: Secondary | ICD-10-CM

## 2016-04-29 DIAGNOSIS — Z0000 Encounter for general adult medical examination without abnormal findings: Secondary | ICD-10-CM | POA: Diagnosis not present

## 2016-04-29 DIAGNOSIS — D509 Iron deficiency anemia, unspecified: Secondary | ICD-10-CM

## 2016-04-29 DIAGNOSIS — I10 Essential (primary) hypertension: Secondary | ICD-10-CM

## 2016-04-29 DIAGNOSIS — F418 Other specified anxiety disorders: Secondary | ICD-10-CM

## 2016-04-29 LAB — CBC WITH DIFFERENTIAL/PLATELET
Basophils Absolute: 0.1 10*3/uL (ref 0.0–0.1)
Basophils Relative: 0.4 % (ref 0.0–3.0)
Eosinophils Absolute: 0.3 10*3/uL (ref 0.0–0.7)
Eosinophils Relative: 2.5 % (ref 0.0–5.0)
HCT: 32 % — ABNORMAL LOW (ref 36.0–46.0)
Hemoglobin: 10.7 g/dL — ABNORMAL LOW (ref 12.0–15.0)
Lymphocytes Relative: 22.5 % (ref 12.0–46.0)
Lymphs Abs: 3 10*3/uL (ref 0.7–4.0)
MCHC: 33.5 g/dL (ref 30.0–36.0)
MCV: 87.4 fl (ref 78.0–100.0)
Monocytes Absolute: 1.2 10*3/uL — ABNORMAL HIGH (ref 0.1–1.0)
Monocytes Relative: 9.1 % (ref 3.0–12.0)
Neutro Abs: 8.7 10*3/uL — ABNORMAL HIGH (ref 1.4–7.7)
Neutrophils Relative %: 65.5 % (ref 43.0–77.0)
Platelets: 377 10*3/uL (ref 150.0–400.0)
RBC: 3.67 Mil/uL — ABNORMAL LOW (ref 3.87–5.11)
RDW: 17.1 % — ABNORMAL HIGH (ref 11.5–15.5)
WBC: 13.2 10*3/uL — ABNORMAL HIGH (ref 4.0–10.5)

## 2016-04-29 LAB — HEPATIC FUNCTION PANEL
ALT: 19 U/L (ref 0–35)
AST: 19 U/L (ref 0–37)
Albumin: 4.5 g/dL (ref 3.5–5.2)
Alkaline Phosphatase: 76 U/L (ref 39–117)
Bilirubin, Direct: 0.1 mg/dL (ref 0.0–0.3)
Total Bilirubin: 0.4 mg/dL (ref 0.2–1.2)
Total Protein: 7.6 g/dL (ref 6.0–8.3)

## 2016-04-29 LAB — BASIC METABOLIC PANEL
BUN: 8 mg/dL (ref 6–23)
CO2: 30 mEq/L (ref 19–32)
Calcium: 9.6 mg/dL (ref 8.4–10.5)
Chloride: 102 mEq/L (ref 96–112)
Creatinine, Ser: 0.7 mg/dL (ref 0.40–1.20)
GFR: 106.97 mL/min (ref >60.00)
Glucose, Bld: 103 mg/dL — ABNORMAL HIGH (ref 70–99)
Potassium: 3.7 mEq/L (ref 3.5–5.1)
Sodium: 140 mEq/L (ref 135–145)

## 2016-04-29 LAB — HEMOGLOBIN A1C: Hgb A1c MFr Bld: 6.6 % — ABNORMAL HIGH (ref 4.6–6.5)

## 2016-04-29 LAB — TSH: TSH: 2.22 u[IU]/mL (ref 0.35–4.50)

## 2016-04-29 MED ORDER — ALPRAZOLAM 0.25 MG PO TABS: 0.2500 mg | ORAL_TABLET | Freq: Two times a day (BID) | ORAL | Status: DC | PRN

## 2016-04-29 NOTE — Patient Instructions (Addendum)
  Valerie [name] , Thank you for taking time to come for your Medicare Wellness Visit. I appreciate your ongoing commitment to your health goals. Please review the following plan we discussed and let me know if I can assist you in the future.   Will fup on Psych physician to get a name participating   Needs caregiver support group [email or web address] Address: [address], [place], [place] [address]  Phone: [phone]  New Goals;  To fup on exercise and fup on Psych;  Will put "time out" on calander SAY NO Have your mammogram   These are the goals we discussed: Goals    . Attending meditation / other stress management classes     [facility]; will try the pool  Active Prayer life; States she can work on this/ Will go to prayer service at [employer] to sister Would like to get back into walking/ may try pool   Apt with Dr. Ronnald Ramp to discuss more affordable meds for depression if she continues to feel bad or mood gets more depressed Is functioning at present  Wellness nurse to put together some resources for disabled son    . Exercise 150 minutes per week (moderate activity)     Will check [employer] and start back exercising if she can;         This is a list of the screening recommended for you and due dates:  Health Maintenance  Topic Date Due  . Mammogram  04/18/2016  . Hemoglobin A1C  06/23/2016  . Flu Shot  07/14/2016  . Complete foot exam   12/24/2016  . Eye exam for diabetics  01/21/2017  . Colon Cancer Screening  02/22/2017  . Pneumonia vaccines (2 of 2 - PPSV23) 01/20/2018  . Tetanus Vaccine  02/06/2020  . DEXA scan (bone density measurement)  Completed  . Shingles Vaccine  Addressed  .  Hepatitis C: One time screening is recommended by Center for Disease Control  (CDC) for  adults born from [birth year] through 1963.   Completed

## 2016-04-29 NOTE — Progress Notes (Signed)
Subjective:  Patient ID: Valerie [name], female    DOB: 05-12-1946  Age: 70 y.o. MRN: [id number]  CC: Medicare Wellness; Hypertension; Hypothyroidism; and Diabetes   HPI Valerie [name] presents for follow-up on hypertension, diabetes, and hypothyroidism. She continues to have the episodes of mild dizziness and weakness. She has recurrent episodes of chest pain at rest that she describes as feeling like there is a pressure or a fist in her chest. She has had a thorough cardiac workup and there is been no evidence of coronary artery disease or congestive heart failure. The pain always occurs at rest and is associated with the overwhelming stress of taking care of a very ill husband and a son who has disabilities.  Outpatient Prescriptions Prior to Visit  Medication Sig Dispense Refill  . albuterol (PROVENTIL) (2.5 MG/3ML) 0.083% nebulizer solution     . albuterol (VENTOLIN HFA) 108 (90 BASE) MCG/ACT inhaler Inhale 2 puffs into the lungs every 6 (six) hours as needed. For shortness of breath 3 Inhaler 4  . amLODipine (NORVASC) 10 MG tablet TAKE ONE TABLET BY MOUTH ONCE DAILY 90 tablet 1  . aspirin 81 MG tablet Take 81 mg by mouth daily.    [name] atorvastatin (LIPITOR) 20 MG tablet Take 1 tablet (20 mg total) by mouth daily. 90 tablet 3  . carvedilol (COREG) 25 MG tablet TAKE ONE TABLET BY MOUTH TWICE DAILY WITH MEALS 180 tablet 3  . fluticasone furoate-vilanterol (BREO ELLIPTA) 200-25 MCG/INH AEPB Inhale 1 puff into the lungs daily. 30 each 11  . gabapentin (NEURONTIN) 300 MG capsule TAKE ONE CAPSULE BY MOUTH THREE TIMES DAILY 90 capsule 3  . glucose blood (FREESTYLE TEST STRIPS) test strip TEST BLOOD SUGAR UP TO THREE TIMES DAILY. 100 each 11  . Insulin Pen Needle 31G X 5 MM MISC Use once daily with insulin 100 each 3  . latanoprost (XALATAN) 0.005 % ophthalmic solution Place 1 drop into both eyes at bedtime.    [name] losartan-hydrochlorothiazide (HYZAAR) 100-12.5 MG tablet TAKE ONE TABLET BY MOUTH  ONCE DAILY 90 tablet 1  . omeprazole (PRILOSEC) 40 MG capsule Take 1 capsule (40 mg total) by mouth daily. 90 capsule 3  . sertraline (ZOLOFT) 50 MG tablet Take 1 tablet (50 mg total) by mouth daily. 30 tablet 3  . SitaGLIPtin-MetFORMIN HCl (JANUMET XR) [phone] MG TB24 Take 1 tablet by mouth daily. 90 tablet 3  . TOUJEO SOLOSTAR 300 UNIT/ML SOPN INJECT 30 UNITS SUBCUTANEOUSLY ONCE DAILY 6 pen 11  . ALPRAZolam (XANAX) 0.25 MG tablet Take 1 tablet (0.25 mg total) by mouth 2 (two) times daily as needed for anxiety. 75 tablet 5  . HYDROcodone-homatropine (HYCODAN) 5-1.5 MG/5ML syrup Take 5 mLs by mouth every 8 (eight) hours as needed for cough. 120 mL 0  . levofloxacin (LEVAQUIN) 750 MG tablet      No facility-administered medications prior to visit.    ROS Review of Systems  Constitutional: Negative for fever, chills, diaphoresis, appetite change and fatigue.  HENT: Negative.   Eyes: Negative.  Negative for visual disturbance.  Respiratory: Negative.  Negative for cough, choking, chest tightness, shortness of breath and stridor.   Cardiovascular: Positive for chest pain. Negative for palpitations and leg swelling.  Gastrointestinal: Negative.  Negative for nausea, vomiting, abdominal pain, diarrhea and constipation.  Endocrine: Negative.   Genitourinary: Negative.   Musculoskeletal: Negative.  Negative for myalgias, back pain, arthralgias and neck pain.  Skin: Negative.  Negative for color change and rash.  Allergic/Immunologic: Negative.   Neurological: Positive for dizziness and weakness.  Hematological: Negative.  Negative for adenopathy. Does not bruise/bleed easily.  Psychiatric/Behavioral: Negative.     Objective:  BP 136/76 mmHg  Pulse 81  Temp(Src) 98.1 F (36.7 C) (Oral)  Resp 16  Ht '5\' 6"'$  (1.676 m)  Wt 276 lb 8 oz (125.42 kg)  BMI 44.65 kg/m2  SpO2 98%  BP Readings from Last 3 Encounters:  04/29/16 136/76  04/16/16 124/80  03/09/16 118/80    Wt Readings from  Last 3 Encounters:  04/29/16 276 lb 8 oz (125.42 kg)  04/16/16 273 lb (123.832 kg)  03/09/16 275 lb (124.739 kg)    Physical Exam  Constitutional: She is oriented to person, place, and time. No distress.  HENT:  Mouth/Throat: Oropharynx is clear and moist. No oropharyngeal exudate.  Eyes: Conjunctivae are normal. Right eye exhibits no discharge. Left eye exhibits no discharge. No scleral icterus.  Neck: Normal range of motion. Neck supple. No JVD present. No tracheal deviation present. No thyromegaly present.  Cardiovascular: Normal rate, regular rhythm, normal heart sounds and intact distal pulses.  Exam reveals no gallop and no friction rub.   No murmur heard. Pulmonary/Chest: Effort normal and breath sounds normal. No stridor. No respiratory distress. She has no wheezes. She has no rales. She exhibits no tenderness.  Abdominal: Soft. Bowel sounds are normal. She exhibits no distension and no mass. There is no tenderness. There is no rebound and no guarding.  Musculoskeletal: Normal range of motion. She exhibits no edema or tenderness.  Lymphadenopathy:    She has no cervical adenopathy.  Neurological: She is oriented to person, place, and time.  Skin: Skin is warm and dry. No rash noted. She is not diaphoretic. No erythema. No pallor.  Vitals reviewed.   Lab Results  Component Value Date   WBC 13.2* 04/29/2016   HGB 10.7* 04/29/2016   HCT 32.0* 04/29/2016   PLT 377.0 04/29/2016   GLUCOSE 103* 04/29/2016   CHOL 137 12/25/2015   TRIG 261.0* 12/25/2015   HDL 48.40 12/25/2015   LDLDIRECT 53.0 12/25/2015   LDLCALC 43 03/28/2014   ALT 19 04/29/2016   AST 19 04/29/2016   NA 140 04/29/2016   K 3.7 04/29/2016   CL 102 04/29/2016   CREATININE 0.70 04/29/2016   BUN 8 04/29/2016   CO2 30 04/29/2016   TSH 2.22 04/29/2016   INR 1.06 08/29/2015   HGBA1C 6.6* 04/29/2016   MICROALBUR 5.8* 12/25/2015    Dg Chest 2 View  04/16/2016  CLINICAL DATA:  Recent diagnosis of pneumonia.  Coughing and wheezing and shortness of breath for 2 weeks. History of hypertension. EXAM: CHEST  2 VIEW COMPARISON:  08/29/2015 FINDINGS: Cardiac silhouette is top-normal in size. No mediastinal or hilar masses or evidence of adenopathy. Clear lungs.  No pleural effusion or pneumothorax. Bony thorax is intact. IMPRESSION: No active cardiopulmonary disease. Electronically Signed   By: Lajean Manes M.D.   On: 04/16/2016 15:36    Assessment & Plan:   Aquasia was seen today for medicare wellness, hypertension, hypothyroidism and diabetes.  Diagnoses and all orders for this visit:  Type 2 diabetes mellitus with complication, without long-term current use of insulin ([place])- her blood sugars are adequately well controlled, will continue the current regimen for blood sugar control. -     Basic metabolic panel; Future -     Hemoglobin A1c; Future -     Hepatic function panel; Future  Other specified hypothyroidism- TSH is  in the normal range, she will remain on the current dose of Synthroid. -     CBC with Differential/Platelet; Future -     TSH; Future  Depression with anxiety -     ALPRAZolam (XANAX) 0.25 MG tablet; Take 1 tablet (0.25 mg total) by mouth 2 (two) times daily as needed for anxiety.  Essential hypertension, benign- her blood pressure is well controlled, electrolytes and renal function are stable.  Iron deficiency anemia- her hemoglobin and hematocrit have gone down, I have asked her to restart an iron supplement. -     ferrous sulfate 325 (65 FE) MG tablet; Take 1 tablet (325 mg total) by mouth 2 (two) times daily with a meal.  Visit for screening mammogram -     MM DIGITAL SCREENING BILATERAL; Future   I have discontinued Ms. Tetro's levofloxacin and HYDROcodone-homatropine. I am also having her start on ferrous sulfate. Additionally, I am having her maintain her latanoprost, albuterol, aspirin, Insulin Pen Needle, amLODipine, glucose blood, omeprazole, atorvastatin, carvedilol,  SitaGLIPtin-MetFORMIN HCl, losartan-hydrochlorothiazide, gabapentin, sertraline, TOUJEO SOLOSTAR, albuterol, fluticasone furoate-vilanterol, and ALPRAZolam.  Meds ordered this encounter  Medications  . ALPRAZolam (XANAX) 0.25 MG tablet    Sig: Take 1 tablet (0.25 mg total) by mouth 2 (two) times daily as needed for anxiety.    Dispense:  75 tablet    Refill:  5  . ferrous sulfate 325 (65 FE) MG tablet    Sig: Take 1 tablet (325 mg total) by mouth 2 (two) times daily with a meal.    Dispense:  60 tablet    Refill:  11     Follow-up: Return in about 4 months (around 08/30/2016).  [name], MD

## 2016-04-29 NOTE — Progress Notes (Signed)
Subjective:   Valerie [name] is a 70 y.o. female who presents for Medicare Annual (Subsequent) preventive examination.  Review of Systems:   HRA assessment completed during visit; [name], [name]   The Patient was informed that this wellness visit is to identify risk and educate on how to reduce risk for increase disease through lifestyle changes.   ROS deferred to CPE exam with physician completed recently Family and medical hx given below;  HTN; ETOH abuse; MI mother; Father MI   HTN; medically managed  Lipids chol 137; Trig 261; HDL 48; LDL 53 DM2 - 12/2015 7.9 OSA is wearingOsteopenia Obesity/ would like to lose weight but is very stressed  Lifestyle review:   Barriers; husband disabled / on dialysis; 3 x per week; he now has prostate cancer;  Needs 40 tx of radiation which has not concluded Presents very stressed; talks about all she has to do for spouse and sister etc. Discussed saying no and taking time for herself;  Goal is to place time on the calendar and learn to say NO and the patient agreed and calmed down.   Meds started Breo; taking prn; educated to take daily States she chest pain last pm cooking dinner; lasted 15 to 20 minutes and stopped when she laid down; Location; mid chest ( 1-5) and states it was a 4; describes as sharp and acute;  Coughs some but better; describes as fairly frequently but not daily;   Psychosocial Lives with spouse;  Carries spouse back and forth for treatment; last week was the end of his 16 days of treatment for new prostate cancer dx Dialysis x 3 days a week; she carried him to the hospital every day Spouse had right knee in nov 2017 and needs left so he is very disabled. Also cares for disabled son;   Tobacco: never smoked ETOH never drank  Medication review/ Adherent; Taking Breo  prn / educated to take daily   BMI: 44.5  Diet;  defered due to time   Exercise;  [employer] and pool exercise x 2 q week Did this early spouse's knee  surgery ended the exercise;  Plans to call and start going    No falls;   Warehouse manager; YES Smoke detectors YES Firearms safety reviewed and will keep in a safe place if these exist. Driving accidents; no;   Advised to use sun protection or large brim hat  Depression: States she is depressed but understands she is overextending in her caregiver role and agrees to start balancing her life;  Goal; To say no to family   Cognitive; deferred due to stress; some forgetfulness;  Manages herself; spouse and disabled son  Fall assessment no Gait assessment appears normal;    Counseling Health Maintenance Colonoscopy; 02/2007  EKG: 02/2016  Mammogram: DUE ; 04/2014/ Noted to have this done sometime this year and will try to do so Dexa/ 04/2014 normal  PAP: educated regarding the need for GYN exam;  Hearing  '[id number]'$  both ears Ophthalmology exam 01/2016 no retinopathy  Immunizations Due: none due   Advanced Directive; will defer but discuss at a later time/ had not completed   Health Recommendations and Referrals To get out and rest; schedule in rest periods on her calendar. Will so no to outside demands placed on her time so that she will have time for herself   Barriers to Success No support in home / Caregiver resources given     Current Care Team reviewed and updated  Objective:     Vitals: BP 136/76 mmHg  Pulse 81  Temp(Src) 98.1 F (36.7 C)  Ht '5\' 6"'$  (1.676 m)  Wt 276 lb 8 oz (125.42 kg)  BMI 44.65 kg/m2  SpO2 98%  Body mass index is 44.65 kg/(m^2).   Tobacco History  Smoking status  . Never Smoker   Smokeless tobacco  . Never Used     Counseling given: Not Answered   Past Medical History  Diagnosis Date  . Anemia   . Depression   . Diabetes mellitus, type 2 ([place])   . GERD (gastroesophageal reflux disease)   . Hyperlipidemia   . Hypertension   . Sleep apnea   . Asthma   . Hemorrhoids   . Fatigue   . Snoring disorder   . Memory  deficit 10/04/2013  . Obesity   . Anxiety and depression   . Glaucoma   . Degenerative arthritis   . Hypothyroid   . Hypoxemia 11/24/2013   Past Surgical History  Procedure Laterality Date  . Benign tumors resected    . Tubal ligation    . Rotator cuff repair    . Cataract extraction Left   . Refractive surgery      Glaucoma  . Foot surgery Left     bone spur   Family History  Problem Relation Age of Onset  . Hypertension      family history  . Alcohol abuse    . Alcohol abuse Mother   . Heart attack Mother   . Coronary artery disease Brother   . Heart attack Father   . Heart disease Sister   . Atrial fibrillation Sister   . Hypertension Sister    History  Sexual Activity  . Sexual Activity: Not Currently    Outpatient Encounter Prescriptions as of 04/29/2016  Medication Sig  . albuterol (PROVENTIL) (2.5 MG/3ML) 0.083% nebulizer solution   . albuterol (VENTOLIN HFA) 108 (90 BASE) MCG/ACT inhaler Inhale 2 puffs into the lungs every 6 (six) hours as needed. For shortness of breath  . ALPRAZolam (XANAX) 0.25 MG tablet Take 1 tablet (0.25 mg total) by mouth 2 (two) times daily as needed for anxiety.  [name] amLODipine (NORVASC) 10 MG tablet TAKE ONE TABLET BY MOUTH ONCE DAILY  . aspirin 81 MG tablet Take 81 mg by mouth daily.  [name] atorvastatin (LIPITOR) 20 MG tablet Take 1 tablet (20 mg total) by mouth daily.  . carvedilol (COREG) 25 MG tablet TAKE ONE TABLET BY MOUTH TWICE DAILY WITH MEALS  . fluticasone furoate-vilanterol (BREO ELLIPTA) 200-25 MCG/INH AEPB Inhale 1 puff into the lungs daily.  [name] gabapentin (NEURONTIN) 300 MG capsule TAKE ONE CAPSULE BY MOUTH THREE TIMES DAILY  . glucose blood (FREESTYLE TEST STRIPS) test strip TEST BLOOD SUGAR UP TO THREE TIMES DAILY.  [name] Insulin Pen Needle 31G X 5 MM MISC Use once daily with insulin  . latanoprost (XALATAN) 0.005 % ophthalmic solution Place 1 drop into both eyes at bedtime.  [name] losartan-hydrochlorothiazide (HYZAAR) 100-12.5 MG  tablet TAKE ONE TABLET BY MOUTH ONCE DAILY  . omeprazole (PRILOSEC) 40 MG capsule Take 1 capsule (40 mg total) by mouth daily.  . sertraline (ZOLOFT) 50 MG tablet Take 1 tablet (50 mg total) by mouth daily.  . SitaGLIPtin-MetFORMIN HCl (JANUMET XR) [phone] MG TB24 Take 1 tablet by mouth daily.  . TOUJEO SOLOSTAR 300 UNIT/ML SOPN INJECT 30 UNITS SUBCUTANEOUSLY ONCE DAILY  . [DISCONTINUED] HYDROcodone-homatropine (HYCODAN) 5-1.5 MG/5ML syrup Take 5 mLs by mouth every  8 (eight) hours as needed for cough.  . [DISCONTINUED] levofloxacin (LEVAQUIN) 750 MG tablet    No facility-administered encounter medications on file as of 04/29/2016.    Activities of Daily Living In your present state of health, do you have any difficulty performing the following activities: 04/29/2016 08/29/2015  Hearing? N N  Vision? N N  Difficulty concentrating or making decisions? (No Data) N  Walking or climbing stairs? Y N  Dressing or bathing? N N  Doing errands, shopping? N N  Preparing Food and eating ? N -  Using the Toilet? N -  In the past six months, have you accidently leaked urine? N -  Do you have problems with loss of bowel control? N -  Managing your Medications? N -  Managing your Finances? N -  Housekeeping or managing your Housekeeping? N -    Patient Care Team: Janith Lima, MD as PCP - General    Assessment:     Exercise Activities and Dietary recommendations    Goals    . Attending meditation / other stress management classes     [facility] a [facility]; will try the pool  Active Prayer life; States she can work on this/ Will go to prayer service at [employer] to sister Would like to get back into walking/ may try pool   Apt with Dr. Ronnald Ramp to discuss more affordable meds for depression if she continues to feel bad or mood gets more depressed Is functioning at present  Wellness nurse to put together some resources for disabled son    . Exercise 150 minutes per week  (moderate activity)     Will check [employer] and start back exercising if she can;      . reducing stress      1. Tell her sister now 2. Tell grand dtr no as well 3. Will work out time for [employer]!!      Fall Risk Fall Risk  04/29/2016 04/29/2015 02/06/2015 01/30/2015 09/01/2013  Falls in the past year? No No No No No   Depression Screen PHQ 2/9 Scores 04/29/2016 04/29/2015 02/06/2015 01/30/2015  PHQ - 2 Score 2 3 0 0  PHQ- 9 Score - 9 - -    Admits to stress but admits she is the only one that can manage her stress; Caregiver resources given;  Will also try to take time to relax;   Cognitive Testing MMSE - Mini Mental State Exam 04/29/2016 04/29/2015 04/29/2015  Not completed: (No Data) (No Data) Unable to complete   AD8 score 0 no failures at this time   Immunization History  Administered Date(s) Administered  . Influenza Split 09/21/2011, 08/19/2012  . Influenza Whole 09/25/2009, 09/04/2010  . Influenza,inj,Quad PF,36+ Mos 09/01/2013, 11/29/2014, 08/30/2015  . Pneumococcal Conjugate-13 07/13/2014  . Pneumococcal Polysaccharide-23 01/20/2013  . Td 02/05/2010   Screening Tests Health Maintenance  Topic Date Due  . MAMMOGRAM  04/18/2016  . HEMOGLOBIN A1C  06/23/2016  . INFLUENZA VACCINE  07/14/2016  . FOOT EXAM  12/24/2016  . OPHTHALMOLOGY EXAM  01/21/2017  . COLONOSCOPY  02/22/2017  . PNA vac Low Risk Adult (2 of 2 - PPSV23) 01/20/2018  . TETANUS/TDAP  02/06/2020  . DEXA SCAN  Completed  . ZOSTAVAX  Addressed  . Hepatitis C Screening  Completed      Plan:   More scheduled time for rest;   During the course of the visit the patient was educated and counseled about the  following appropriate screening and preventive services:   Vaccines to include Pneumoccal, Influenza, Hepatitis B, Td, Zostavax, HCV up to date  Electrocardiogram/ 02/2016  Cardiovascular Disease; medically managed   Would like to lose weight; discussed exercise more   Colorectal cancer  screening/ states she doesn't need anymore  Bone density screening- normal  Diabetes screening/ in good control  Glaucoma screening/ 01/2016 no issues   Mammography/due and will schedule  Nutrition counseling   Patient Instructions (the written plan) was given to the patient.   Wynetta Fines, RN  04/29/2016

## 2016-04-30 ENCOUNTER — Encounter: Payer: Self-pay | Admitting: Internal Medicine

## 2016-05-03 MED ORDER — FERROUS SULFATE 325 (65 FE) MG PO TABS: 325.0000 mg | ORAL_TABLET | Freq: Two times a day (BID) | ORAL | Status: DC

## 2016-05-04 ENCOUNTER — Other Ambulatory Visit: Payer: Self-pay | Admitting: Internal Medicine

## 2016-05-04 DIAGNOSIS — Z1231 Encounter for screening mammogram for malignant neoplasm of breast: Secondary | ICD-10-CM

## 2016-05-13 ENCOUNTER — Other Ambulatory Visit (INDEPENDENT_AMBULATORY_CARE_PROVIDER_SITE_OTHER): Payer: PPO

## 2016-05-13 ENCOUNTER — Ambulatory Visit (INDEPENDENT_AMBULATORY_CARE_PROVIDER_SITE_OTHER): Payer: PPO | Admitting: Internal Medicine

## 2016-05-13 ENCOUNTER — Encounter: Payer: Self-pay | Admitting: Internal Medicine

## 2016-05-13 VITALS — BP 128/78 | HR 84 | Temp 98.4°F | Resp 16 | Ht 66.0 in | Wt 276.0 lb

## 2016-05-13 DIAGNOSIS — D72829 Elevated white blood cell count, unspecified: Secondary | ICD-10-CM

## 2016-05-13 DIAGNOSIS — D509 Iron deficiency anemia, unspecified: Secondary | ICD-10-CM

## 2016-05-13 DIAGNOSIS — F418 Other specified anxiety disorders: Secondary | ICD-10-CM

## 2016-05-13 LAB — IBC PANEL
Iron: 64 ug/dL (ref 42–145)
Saturation Ratios: 15.8 % — ABNORMAL LOW (ref 20.0–50.0)
Transferrin: 289 mg/dL (ref 212.0–360.0)

## 2016-05-13 LAB — CBC WITH DIFFERENTIAL/PLATELET
Basophils Absolute: 0 10*3/uL (ref 0.0–0.1)
Basophils Relative: 0.2 % (ref 0.0–3.0)
Eosinophils Absolute: 0.3 10*3/uL (ref 0.0–0.7)
Eosinophils Relative: 2.4 % (ref 0.0–5.0)
HCT: 31.6 % — ABNORMAL LOW (ref 36.0–46.0)
Hemoglobin: 10.5 g/dL — ABNORMAL LOW (ref 12.0–15.0)
Lymphocytes Relative: 24.2 % (ref 12.0–46.0)
Lymphs Abs: 2.9 10*3/uL (ref 0.7–4.0)
MCHC: 33.2 g/dL (ref 30.0–36.0)
MCV: 85.8 fl (ref 78.0–100.0)
Monocytes Absolute: 1.1 10*3/uL — ABNORMAL HIGH (ref 0.1–1.0)
Monocytes Relative: 9.3 % (ref 3.0–12.0)
Neutro Abs: 7.7 10*3/uL (ref 1.4–7.7)
Neutrophils Relative %: 63.9 % (ref 43.0–77.0)
Platelets: 368 10*3/uL (ref 150.0–400.0)
RBC: 3.69 Mil/uL — ABNORMAL LOW (ref 3.87–5.11)
RDW: 16 % — ABNORMAL HIGH (ref 11.5–15.5)
WBC: 12.1 10*3/uL — ABNORMAL HIGH (ref 4.0–10.5)

## 2016-05-13 LAB — FERRITIN: Ferritin: 43.1 ng/mL (ref 10.0–291.0)

## 2016-05-13 MED ORDER — SERTRALINE HCL 100 MG PO TABS: 100.0000 mg | ORAL_TABLET | Freq: Every day | ORAL | Status: DC

## 2016-05-13 NOTE — Progress Notes (Signed)
Pre visit review using our clinic review tool, if applicable. No additional management support is needed unless otherwise documented below in the visit note. 

## 2016-05-13 NOTE — Patient Instructions (Signed)
Iron Deficiency Anemia, Adult Anemia is a condition in which there are less red blood cells or hemoglobin in the blood than normal. Hemoglobin is the part of red blood cells that carries oxygen. Iron deficiency anemia is anemia caused by too little iron. It is the most common type of anemia. It may leave you tired and short of breath. CAUSES   Lack of iron in the diet.  Poor absorption of iron, as seen with intestinal disorders.  Intestinal bleeding.  Heavy periods. SIGNS AND SYMPTOMS  Mild anemia may not be noticeable. Symptoms may include:  Fatigue.  Headache.  Pale skin.  Weakness.  Tiredness.  Shortness of breath.  Dizziness.  Cold hands and feet.  Fast or irregular heartbeat. DIAGNOSIS  Diagnosis requires a thorough evaluation and physical exam by your health care provider. Blood tests are generally used to confirm iron deficiency anemia. Additional tests may be done to find the underlying cause of your anemia. These may include:  Testing for blood in the stool (fecal occult blood test).  A procedure to see inside the colon and rectum (colonoscopy).  A procedure to see inside the esophagus and stomach (endoscopy). TREATMENT  Iron deficiency anemia is treated by correcting the cause of the deficiency. Treatment may involve:  Adding iron-rich foods to your diet.  Taking iron supplements. Pregnant or breastfeeding women need to take extra iron because their normal diet usually does not provide the required amount.  Taking vitamins. Vitamin C improves the absorption of iron. Your health care provider may recommend that you take your iron tablets with a glass of orange juice or vitamin C supplement.  Medicines to make heavy menstrual flow lighter.  Surgery. HOME CARE INSTRUCTIONS   Take iron as directed by your health care provider.  If you cannot tolerate taking iron supplements by mouth, talk to your health care provider about taking them through a vein  (intravenously) or an injection into a muscle.  For the best iron absorption, iron supplements should be taken on an empty stomach. If you cannot tolerate them on an empty stomach, you may need to take them with food.  Do not drink milk or take antacids at the same time as your iron supplements. Milk and antacids may interfere with the absorption of iron.  Iron supplements can cause constipation. Make sure to include fiber in your diet to prevent constipation. A stool softener may also be recommended.  Take vitamins as directed by your health care provider.  Eat a diet rich in iron. Foods high in iron include liver, lean beef, whole-grain bread, eggs, dried fruit, and dark [place] leafy vegetables. SEEK IMMEDIATE MEDICAL CARE IF:   You faint. If this happens, do not drive. Call your local emergency services (911 in [place] if no other help is available.  You have chest pain.  You feel nauseous or vomit.  You have severe or increased shortness of breath with activity.  You feel weak.  You have a rapid heartbeat.  You have unexplained sweating.  You become light-headed when getting up from a chair or bed. MAKE SURE YOU:   Understand these instructions.  Will watch your condition.  Will get help right away if you are not doing well or get worse.   This information is not intended to replace advice given to you by your health care provider. Make sure you discuss any questions you have with your health care provider.   Document Released: 11/27/2000 Document Revised: 12/21/2014 Document Reviewed: 08/07/2013 Elsevier   Interactive Patient Education 2016 Elsevier Inc.  

## 2016-05-13 NOTE — Progress Notes (Signed)
Subjective:  Patient ID: Valerie [name], female    DOB: 1946-06-29  Age: 70 y.o. MRN: [id number]  CC: Anemia; Diabetes; and Depression   HPI Valerie [name] presents for follow-up on the above medical problems and monitoring an elevated white blood cell count. She tells me that her blood sugars been relatively well controlled. The lowest blood sugar she has had in the last month or 2 has been 69, usually it's in the 70-130 range. She denies polyuria, polydipsia, or polyphagia. She is unable to lose weight because she feels stressed about caring for her ill husband and disabled son.  Outpatient Prescriptions Prior to Visit  Medication Sig Dispense Refill  . albuterol (PROVENTIL) (2.5 MG/3ML) 0.083% nebulizer solution     . albuterol (VENTOLIN HFA) 108 (90 BASE) MCG/ACT inhaler Inhale 2 puffs into the lungs every 6 (six) hours as needed. For shortness of breath 3 Inhaler 4  . ALPRAZolam (XANAX) 0.25 MG tablet Take 1 tablet (0.25 mg total) by mouth 2 (two) times daily as needed for anxiety. 75 tablet 5  . aspirin 81 MG tablet Take 81 mg by mouth daily.    [name] atorvastatin (LIPITOR) 20 MG tablet Take 1 tablet (20 mg total) by mouth daily. 90 tablet 3  . carvedilol (COREG) 25 MG tablet TAKE ONE TABLET BY MOUTH TWICE DAILY WITH MEALS 180 tablet 3  . ferrous sulfate 325 (65 FE) MG tablet Take 1 tablet (325 mg total) by mouth 2 (two) times daily with a meal. 60 tablet 11  . fluticasone furoate-vilanterol (BREO ELLIPTA) 200-25 MCG/INH AEPB Inhale 1 puff into the lungs daily. 30 each 11  . gabapentin (NEURONTIN) 300 MG capsule TAKE ONE CAPSULE BY MOUTH THREE TIMES DAILY 90 capsule 3  . glucose blood (FREESTYLE TEST STRIPS) test strip TEST BLOOD SUGAR UP TO THREE TIMES DAILY. 100 each 11  . Insulin Pen Needle 31G X 5 MM MISC Use once daily with insulin 100 each 3  . latanoprost (XALATAN) 0.005 % ophthalmic solution Place 1 drop into both eyes at bedtime.    [name] losartan-hydrochlorothiazide (HYZAAR)  100-12.5 MG tablet TAKE ONE TABLET BY MOUTH ONCE DAILY 90 tablet 1  . omeprazole (PRILOSEC) 40 MG capsule Take 1 capsule (40 mg total) by mouth daily. 90 capsule 3  . SitaGLIPtin-MetFORMIN HCl (JANUMET XR) [phone] MG TB24 Take 1 tablet by mouth daily. 90 tablet 3  . TOUJEO SOLOSTAR 300 UNIT/ML SOPN INJECT 30 UNITS SUBCUTANEOUSLY ONCE DAILY 6 pen 11  . amLODipine (NORVASC) 10 MG tablet TAKE ONE TABLET BY MOUTH ONCE DAILY 90 tablet 1  . sertraline (ZOLOFT) 50 MG tablet Take 1 tablet (50 mg total) by mouth daily. 30 tablet 3   No facility-administered medications prior to visit.    ROS Review of Systems  Constitutional: Negative for fever, chills, diaphoresis, appetite change and fatigue.  HENT: Negative.   Eyes: Negative.   Respiratory: Negative.  Negative for cough, choking, chest tightness, shortness of breath and stridor.   Cardiovascular: Negative.  Negative for chest pain, palpitations and leg swelling.  Gastrointestinal: Negative.  Negative for nausea, vomiting, abdominal pain, diarrhea, constipation and blood in stool.  Endocrine: Negative.  Negative for polydipsia, polyphagia and polyuria.  Genitourinary: Negative.   Musculoskeletal: Negative.  Negative for myalgias, back pain, joint swelling and arthralgias.  Skin: Negative.  Negative for color change, pallor and rash.  Allergic/Immunologic: Negative.   Neurological: Negative.  Negative for dizziness, tremors, syncope, light-headedness, numbness and headaches.  Hematological: Negative.  Negative for adenopathy. Does not bruise/bleed easily.  Psychiatric/Behavioral: Positive for sleep disturbance and dysphoric mood. Negative for suicidal ideas, behavioral problems, confusion, self-injury, decreased concentration and agitation. The patient is nervous/anxious.        She complains of persistent anhedonia, anxiety, feelings of helplessness, and occasional insomnia.    Objective:  BP 128/78 mmHg  Pulse 84  Temp(Src) 98.4 F (36.9  C) (Oral)  Ht 5\' 6"  (1.676 m)  Wt 276 lb (125.193 kg)  BMI 44.57 kg/m2  SpO2 96%  BP Readings from Last 3 Encounters:  05/13/16 128/78  04/29/16 136/76  04/16/16 124/80    Wt Readings from Last 3 Encounters:  05/13/16 276 lb (125.193 kg)  04/29/16 276 lb 8 oz (125.42 kg)  04/16/16 273 lb (123.832 kg)    Physical Exam  Constitutional: She is oriented to person, place, and time. No distress.  HENT:  Mouth/Throat: Oropharynx is clear and moist. No oropharyngeal exudate.  Eyes: Conjunctivae are normal. Right eye exhibits no discharge. Left eye exhibits no discharge. No scleral icterus.  Neck: Normal range of motion. Neck supple. No JVD present. No tracheal deviation present. No thyromegaly present.  Cardiovascular: Normal rate, regular rhythm, normal heart sounds and intact distal pulses.  Exam reveals no gallop and no friction rub.   No murmur heard. Pulmonary/Chest: Effort normal and breath sounds normal. No stridor. No respiratory distress. She has no wheezes. She has no rales. She exhibits no tenderness.  Abdominal: Soft. Bowel sounds are normal. She exhibits no distension and no mass. There is no tenderness. There is no rebound and no guarding.  Musculoskeletal: Normal range of motion. She exhibits no edema or tenderness.  Lymphadenopathy:    She has no cervical adenopathy.  Neurological: She is oriented to person, place, and time.  Skin: Skin is warm and dry. No rash noted. She is not diaphoretic. No erythema. No pallor.  Vitals reviewed.   Lab Results  Component Value Date   WBC 12.1* 05/13/2016   HGB 10.5* 05/13/2016   HCT 31.6* 05/13/2016   PLT 368.0 05/13/2016   GLUCOSE 103* 04/29/2016   CHOL 137 12/25/2015   TRIG 261.0* 12/25/2015   HDL 48.40 12/25/2015   LDLDIRECT 53.0 12/25/2015   LDLCALC 43 03/28/2014   ALT 19 04/29/2016   AST 19 04/29/2016   NA 140 04/29/2016   K 3.7 04/29/2016   CL 102 04/29/2016   CREATININE 0.70 04/29/2016   BUN 8 04/29/2016   CO2  30 04/29/2016   TSH 2.22 04/29/2016   INR 1.06 08/29/2015   HGBA1C 6.6* 04/29/2016   MICROALBUR 5.8* 12/25/2015    Dg Chest 2 View  04/16/2016  CLINICAL DATA:  Recent diagnosis of pneumonia. Coughing and wheezing and shortness of breath for 2 weeks. History of hypertension. EXAM: CHEST  2 VIEW COMPARISON:  08/29/2015 FINDINGS: Cardiac silhouette is top-normal in size. No mediastinal or hilar masses or evidence of adenopathy. Clear lungs.  No pleural effusion or pneumothorax. Bony thorax is intact. IMPRESSION: No active cardiopulmonary disease. Electronically Signed   By: Lajean Manes M.D.   On: 04/16/2016 15:36    Assessment & Plan:   Valerie [name] was seen today for anemia, diabetes and depression.  Diagnoses and all orders for this visit:  Iron deficiency anemia- her anemia has not improved despite iron replacement therapy, will refer to hematology for further evaluation. -     IBC panel; Future -     Ferritin; Future -     CBC with Differential/Platelet;  Future  Depression with anxiety- I think she would benefit from an increase in the sertraline dose, will increase from 50 mg to 100 mg and over the next month or 2 anticipate increasing to 150 or 200 as needed. -     sertraline (ZOLOFT) 100 MG tablet; Take 1 tablet (100 mg total) by mouth daily.  Leukocytosis- She has a consistently elevated white blood cell count and a refractory anemia, her differential only shows a slight increase in monocytes. Flow cytometry is unremarkable but her SPEP shows a slightly abnormal band, I have therefore asked her to see hematology for further evaluation and monitoring to see if there is concern for lymphoproliferative disease. -     CBC with Differential/Platelet; Future -     Immunophenotyping by Flow Cytometry; Future -     Protein electrophoresis, serum; Future -     Ambulatory referral to Hematology   I have discontinued Valerie [name] sertraline. I am also having her start on sertraline.  Additionally, I am having her maintain her latanoprost, albuterol, aspirin, Insulin Pen Needle, glucose blood, omeprazole, atorvastatin, carvedilol, SitaGLIPtin-MetFORMIN HCl, losartan-hydrochlorothiazide, gabapentin, TOUJEO SOLOSTAR, albuterol, fluticasone furoate-vilanterol, ALPRAZolam, ferrous sulfate, and oxyCODONE-acetaminophen.  Meds ordered this encounter  Medications  . oxyCODONE-acetaminophen (PERCOCET) 7.5-325 MG tablet    Sig:   . sertraline (ZOLOFT) 100 MG tablet    Sig: Take 1 tablet (100 mg total) by mouth daily.    Dispense:  90 tablet    Refill:  3     Follow-up: Return in about 3 months (around 08/13/2016).  [name], MD

## 2016-05-14 LAB — IMMUNOPHENOTYPING BY FLOW CYTOMETRY: Viability: 93 %

## 2016-05-14 LAB — REFLEX BLAST SCREEN

## 2016-05-15 ENCOUNTER — Other Ambulatory Visit: Payer: Self-pay | Admitting: Internal Medicine

## 2016-05-15 LAB — PROTEIN ELECTROPHORESIS, SERUM
Albumin ELP: 3.8 g/dL (ref 3.8–4.8)
Alpha-1-Globulin: 0.4 g/dL — ABNORMAL HIGH (ref 0.2–0.3)
Alpha-2-Globulin: 1 g/dL — ABNORMAL HIGH (ref 0.5–0.9)
Beta 2: 0.5 g/dL (ref 0.2–0.5)
Beta Globulin: 0.5 g/dL (ref 0.4–0.6)
Gamma Globulin: 1 g/dL (ref 0.8–1.7)
Total Protein, Serum Electrophoresis: 7.2 g/dL (ref 6.1–8.1)

## 2016-05-20 ENCOUNTER — Ambulatory Visit: Payer: PPO

## 2016-05-21 ENCOUNTER — Telehealth: Payer: Self-pay | Admitting: Internal Medicine

## 2016-05-21 NOTE — Telephone Encounter (Signed)
LVM for pt to call back as soon as possible.   

## 2016-05-21 NOTE — Telephone Encounter (Signed)
Please follow back up with patient in regards to labs.

## 2016-05-27 DIAGNOSIS — Z1231 Encounter for screening mammogram for malignant neoplasm of breast: Secondary | ICD-10-CM | POA: Diagnosis not present

## 2016-05-27 LAB — HM MAMMOGRAPHY

## 2016-06-01 ENCOUNTER — Telehealth: Payer: Self-pay | Admitting: Hematology and Oncology

## 2016-06-01 ENCOUNTER — Encounter: Payer: Self-pay | Admitting: Hematology and Oncology

## 2016-06-01 NOTE — Telephone Encounter (Signed)
Telephone call to patient to schedule appointment. Agreed to appointment on 6/26 with [facility] at [facility]. Demographics verified. Letter to the referring

## 2016-06-08 ENCOUNTER — Ambulatory Visit: Payer: PPO | Admitting: Hematology and Oncology

## 2016-06-08 ENCOUNTER — Encounter: Payer: Self-pay | Admitting: Hematology and Oncology

## 2016-06-08 ENCOUNTER — Ambulatory Visit (HOSPITAL_BASED_OUTPATIENT_CLINIC_OR_DEPARTMENT_OTHER): Payer: PPO | Admitting: Hematology and Oncology

## 2016-06-08 VITALS — BP 135/58 | HR 76 | Temp 98.3°F | Resp 18 | Ht 66.0 in | Wt 271.8 lb

## 2016-06-08 DIAGNOSIS — Z6841 Body Mass Index (BMI) 40.0 and over, adult: Secondary | ICD-10-CM | POA: Diagnosis not present

## 2016-06-08 DIAGNOSIS — D638 Anemia in other chronic diseases classified elsewhere: Secondary | ICD-10-CM

## 2016-06-08 DIAGNOSIS — D72829 Elevated white blood cell count, unspecified: Secondary | ICD-10-CM

## 2016-06-08 DIAGNOSIS — D649 Anemia, unspecified: Secondary | ICD-10-CM

## 2016-06-09 DIAGNOSIS — D638 Anemia in other chronic diseases classified elsewhere: Secondary | ICD-10-CM | POA: Insufficient documentation

## 2016-06-09 NOTE — Assessment & Plan Note (Signed)
The cause of her chronic, intermittent leukocytosis is likely reactive in nature, related to stress, infection, intermittent asthma exacerbation or steroid treatment. I reassured the patient that the pattern of leukocytosis is reactive. She does not need further workup.

## 2016-06-09 NOTE — Progress Notes (Signed)
[facility] NOTE  Patient Care Team: Janith Lima, MD as PCP - General  CHIEF COMPLAINTS/PURPOSE OF CONSULTATION:  Chronic leukocytosis  HISTORY OF PRESENTING ILLNESS:  Valerie [name] 70 y.o. female is here because of elevated WBC.  She was found to have abnormal CBC from routine blood work monitoring. I have the opportunity to review her blood test results dated back to 2010. She has chronic intermittent leukocytosis with normal white blood cell count in between. She is also noted to have intermittent anemia. The patient had background history of asthma. She has intermittent, recurrent acute exacerbation of asthmatic attack. Her last pneumonia treatment was around March or April of this year. She also received intermittent corticosteroid injections and treatment for bronchitis. She also has occasional sinus drainage.  There is not reported symptoms of urinary frequency/urgency or dysuria, diarrhea, joint swelling/pain or abnormal skin rash.  She had no prior history or diagnosis of cancer. Her age appropriate screening programs are up-to-date. The patient has no prior diagnosis of autoimmune disease. She uses chronic corticosteroid inhaler The patient also complained of intermittent epigastric discomfort. In September 2016, she had extensive workup to exclude pulmonary emboli and cardiac disease. The patient denies any recent signs or symptoms of bleeding such as spontaneous epistaxis, hematuria or hematochezia. She takes oral iron supplement for occasional iron deficiency anemia  She uses chronic NSAID for joint pain   MEDICAL HISTORY:  Past Medical History  Diagnosis Date  . Anemia   . Depression   . Diabetes mellitus, type 2 ([place])   . GERD (gastroesophageal reflux disease)   . Hyperlipidemia   . Hypertension   . Sleep apnea   . Asthma   . Hemorrhoids   . Fatigue   . Snoring disorder   . Memory deficit 10/04/2013  . Obesity   . Anxiety and  depression   . Glaucoma   . Degenerative arthritis   . Hypothyroid   . Hypoxemia 11/24/2013    SURGICAL HISTORY: Past Surgical History  Procedure Laterality Date  . Benign tumors resected    . Tubal ligation    . Rotator cuff repair    . Cataract extraction Left   . Refractive surgery      Glaucoma  . Foot surgery Left     bone spur    SOCIAL HISTORY: Social History   Social History  . Marital Status: Married    Spouse Name: N/A  . Number of Children: 3  . Years of Education: 11th   Occupational History  . disabled    Social History Main Topics  . Smoking status: Never Smoker   . Smokeless tobacco: Never Used  . Alcohol Use: No  . Drug Use: No  . Sexual Activity: Not Currently     Comment: not working, lives with husband, 3 sons   Other Topics Concern  . 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    FAMILY HISTORY: Family History  Problem Relation Age of Onset  . Hypertension      family history  . Alcohol abuse    . Alcohol abuse Mother   . Heart attack Mother   . Coronary artery disease Brother   . Heart attack Father   . Heart disease Sister   . Atrial fibrillation Sister   . Hypertension Sister     ALLERGIES:  is allergic  to food and penicillins.  MEDICATIONS:  Current Outpatient Prescriptions  Medication Sig Dispense Refill  . albuterol (PROVENTIL) (2.5 MG/3ML) 0.083% nebulizer solution     . albuterol (VENTOLIN HFA) 108 (90 BASE) MCG/ACT inhaler Inhale 2 puffs into the lungs every 6 (six) hours as needed. For shortness of breath 3 Inhaler 4  . ALPRAZolam (XANAX) 0.25 MG tablet Take 1 tablet (0.25 mg total) by mouth 2 (two) times daily as needed for anxiety. 75 tablet 5  . amLODipine (NORVASC) 10 MG tablet TAKE ONE TABLET BY MOUTH ONCE DAILY. 90 tablet 0  . aspirin 81 MG tablet Take 81 mg by mouth daily.    [name] atorvastatin (LIPITOR) 20 MG tablet  Take 1 tablet (20 mg total) by mouth daily. 90 tablet 3  . carvedilol (COREG) 25 MG tablet TAKE ONE TABLET BY MOUTH TWICE DAILY WITH MEALS 180 tablet 3  . ferrous sulfate 325 (65 FE) MG tablet Take 1 tablet (325 mg total) by mouth 2 (two) times daily with a meal. 60 tablet 11  . fluticasone furoate-vilanterol (BREO ELLIPTA) 200-25 MCG/INH AEPB Inhale 1 puff into the lungs daily. 30 each 11  . gabapentin (NEURONTIN) 300 MG capsule TAKE ONE CAPSULE BY MOUTH THREE TIMES DAILY 90 capsule 3  . glucose blood (FREESTYLE TEST STRIPS) test strip TEST BLOOD SUGAR UP TO THREE TIMES DAILY. 100 each 11  . Insulin Pen Needle 31G X 5 MM MISC Use once daily with insulin 100 each 3  . latanoprost (XALATAN) 0.005 % ophthalmic solution Place 1 drop into both eyes at bedtime.    [name] losartan-hydrochlorothiazide (HYZAAR) 100-12.5 MG tablet TAKE ONE TABLET BY MOUTH ONCE DAILY 90 tablet 1  . omeprazole (PRILOSEC) 40 MG capsule Take 1 capsule (40 mg total) by mouth daily. 90 capsule 3  . oxyCODONE-acetaminophen (PERCOCET) 7.5-325 MG tablet     . sertraline (ZOLOFT) 100 MG tablet Take 1 tablet (100 mg total) by mouth daily. 90 tablet 3  . SitaGLIPtin-MetFORMIN HCl (JANUMET XR) 671-723-0025 MG TB24 Take 1 tablet by mouth daily. 90 tablet 3  . TOUJEO SOLOSTAR 300 UNIT/ML SOPN INJECT 30 UNITS SUBCUTANEOUSLY ONCE DAILY 6 pen 11   No current facility-administered medications for this visit.    REVIEW OF SYSTEMS:   Constitutional: Denies fevers, chills or abnormal night sweats Eyes: Denies blurriness of vision, double vision or watery eyes Ears, nose, mouth, throat, and face: Denies mucositis or sore throat Respiratory: Denies cough, dyspnea or wheezes Cardiovascular: Denies palpitation, chest discomfort or lower extremity swelling Gastrointestinal:  Denies nausea, heartburn or change in bowel habits Skin: Denies abnormal skin rashes Lymphatics: Denies new lymphadenopathy or easy bruising Neurological:Denies numbness,  tingling or new weaknesses Behavioral/Psych: Mood is stable, no new changes  All other systems were reviewed with the patient and are negative.  PHYSICAL EXAMINATION: ECOG PERFORMANCE STATUS: 1 - Symptomatic but completely ambulatory  Filed Vitals:   06/08/16 1352  BP: 135/58  Pulse: 76  Temp: 98.3 F (36.8 C)  Resp: 18   Filed Weights   06/08/16 1352  Weight: 271 lb 12.8 oz (123.288 kg)    GENERAL:alert, no distress and comfortable. She is morbidly obese SKIN: skin color, texture, turgor are normal, no rashes or significant lesions EYES: normal, conjunctiva are pink and non-injected, sclera clear OROPHARYNX:no exudate, no erythema and lips, buccal mucosa, and tongue normal  NECK: supple, thyroid normal size, non-tender, without nodularity LYMPH:  no palpable lymphadenopathy in the cervical, axillary or inguinal LUNGS: clear to auscultation and percussion  with normal breathing effort HEART: regular rate & rhythm and no murmurs and no lower extremity edema ABDOMEN:abdomen soft, non-tender and normal bowel sounds Musculoskeletal:no cyanosis of digits and no clubbing  PSYCH: alert & oriented x 3 with fluent speech NEURO: no focal motor/sensory deficits  LABORATORY DATA:  I have reviewed the data as listed Recent Results (from the past 2160 hour(s))  CBC with Differential/Platelet     Status: Abnormal   Collection Time: 04/29/16  2:59 PM  Result Value Ref Range   WBC 13.2 (H) 4.0 - 10.5 K/uL   RBC 3.67 (L) 3.87 - 5.11 Mil/uL   Hemoglobin 10.7 (L) 12.0 - 15.0 g/dL   HCT 32.0 (L) 36.0 - 46.0 %   MCV 87.4 78.0 - 100.0 fl   MCHC 33.5 30.0 - 36.0 g/dL   RDW 17.1 (H) 11.5 - 15.5 %   Platelets 377.0 150.0 - 400.0 K/uL   Neutrophils Relative % 65.5 43.0 - 77.0 %   Lymphocytes Relative 22.5 12.0 - 46.0 %   Monocytes Relative 9.1 3.0 - 12.0 %   Eosinophils Relative 2.5 0.0 - 5.0 %   Basophils Relative 0.4 0.0 - 3.0 %   Neutro Abs 8.7 (H) 1.4 - 7.7 K/uL   Lymphs Abs 3.0 0.7 -  4.0 K/uL   Monocytes Absolute 1.2 (H) 0.1 - 1.0 K/uL   Eosinophils Absolute 0.3 0.0 - 0.7 K/uL   Basophils Absolute 0.1 0.0 - 0.1 K/uL  Basic metabolic panel     Status: Abnormal   Collection Time: 04/29/16  2:59 PM  Result Value Ref Range   Sodium 140 135 - 145 mEq/L   Potassium 3.7 3.5 - 5.1 mEq/L   Chloride 102 96 - 112 mEq/L   CO2 30 19 - 32 mEq/L   Glucose, Bld 103 (H) 70 - 99 mg/dL   BUN 8 6 - 23 mg/dL   Creatinine, Ser 0.70 0.40 - 1.20 mg/dL   Calcium 9.6 8.4 - 10.5 mg/dL   GFR 106.97 >60.00 mL/min  Hemoglobin A1c     Status: Abnormal   Collection Time: 04/29/16  2:59 PM  Result Value Ref Range   Hgb A1c MFr Bld 6.6 (H) 4.6 - 6.5 %    Comment: Glycemic Control Guidelines for People with Diabetes:Non Diabetic:  <6%Goal of Therapy: <7%Additional Action Suggested:  >8%   TSH     Status: None   Collection Time: 04/29/16  2:59 PM  Result Value Ref Range   TSH 2.22 0.35 - 4.50 uIU/mL  Hepatic function panel     Status: None   Collection Time: 04/29/16  2:59 PM  Result Value Ref Range   Total Bilirubin 0.4 0.2 - 1.2 mg/dL   Bilirubin, Direct 0.1 0.0 - 0.3 mg/dL   Alkaline Phosphatase 76 39 - 117 U/L   AST 19 0 - 37 U/L   ALT 19 0 - 35 U/L   Total Protein 7.6 6.0 - 8.3 g/dL   Albumin 4.5 3.5 - 5.2 g/dL  IBC panel     Status: Abnormal   Collection Time: 05/13/16  3:25 PM  Result Value Ref Range   Iron 64 42 - 145 ug/dL   Transferrin 289.0 212.0 - 360.0 mg/dL   Saturation Ratios 15.8 (L) 20.0 - 50.0 %  Ferritin     Status: None   Collection Time: 05/13/16  3:25 PM  Result Value Ref Range   Ferritin 43.1 10.0 - 291.0 ng/mL  CBC with Differential/Platelet     Status:  Abnormal   Collection Time: 05/13/16  3:25 PM  Result Value Ref Range   WBC 12.1 (H) 4.0 - 10.5 K/uL   RBC 3.69 (L) 3.87 - 5.11 Mil/uL   Hemoglobin 10.5 (L) 12.0 - 15.0 g/dL   HCT 31.6 (L) 36.0 - 46.0 %   MCV 85.8 78.0 - 100.0 fl   MCHC 33.2 30.0 - 36.0 g/dL   RDW 16.0 (H) 11.5 - 15.5 %   Platelets 368.0  150.0 - 400.0 K/uL   Neutrophils Relative % 63.9 43.0 - 77.0 %   Lymphocytes Relative 24.2 12.0 - 46.0 %   Monocytes Relative 9.3 3.0 - 12.0 %   Eosinophils Relative 2.4 0.0 - 5.0 %   Basophils Relative 0.2 0.0 - 3.0 %   Neutro Abs 7.7 1.4 - 7.7 K/uL   Lymphs Abs 2.9 0.7 - 4.0 K/uL   Monocytes Absolute 1.1 (H) 0.1 - 1.0 K/uL   Eosinophils Absolute 0.3 0.0 - 0.7 K/uL   Basophils Absolute 0.0 0.0 - 0.1 K/uL  Immunophenotyping by Flow Cytometry     Status: None   Collection Time: 05/13/16  3:25 PM  Result Value Ref Range   Viability 93 %    Comment:     Phenotype Not Indicated    Interpretation: SEE NOTE     Comment: A monoclonal B cell population was not detected.   There is no loss of or aberrant expression of the pan T cell antigens to suggest a neoplastic T cell process.   A blast phenotype was not detected. Immunophenotyping showed approximately 74% myeloid cells, 21% lymphocytes and 5% monocytes.   Lymphocyte subsets include 9% B cells, 2% NK cells and 81% T cells of which 66% are CD4-positive T helper cells, and 15% are CD8-positive T suppressor cells.     Antigens tested in the Lymphoid Panel include: CD2,CD3,CD4,CD5,CD7,CD8,CD10(alb1),CD11c,CD19,CD20,CD22,CD23,CD38, CD45,CD56,FMC7,KAPPA light chains and LAMBDA light chains. Reviewed by [name] Gaines Mammarappallil MD (Electronic Signature on File) This test was developed and its analytical performance characteristics have been determined by [employer].  It has not been cleared or approved by the U.S. Food and Drug Administration.  The FDA has determined that such clearance or approval is not necessary. This assay has  been validated pursuant to the CLIA regulations and is used for clinical purposes.    Protein electrophoresis, serum     Status: Abnormal   Collection Time: 05/13/16  3:25 PM  Result Value Ref Range   Total Protein, Serum Electrophoresis 7.2 6.1 - 8.1 g/dL   Albumin ELP 3.8 3.8 - 4.8 g/dL    Alpha-1-Globulin 0.4 (H) 0.2 - 0.3 g/dL   Alpha-2-Globulin 1.0 (H) 0.5 - 0.9 g/dL   Beta Globulin 0.5 0.4 - 0.6 g/dL   Beta 2 0.5 0.2 - 0.5 g/dL   Gamma Globulin 1.0 0.8 - 1.7 g/dL   Abnormal Protein Band1 NOT DET g/dL   SPE Interp. SEE NOTE     Comment: A poorly defined band of restricted protein mobility is detected in the gamma globulins. It is unlikely this may represent a monoclonal protein, however, immunofixation analysis is available if clinically indicated. Reviewed by Odis Hollingshead, MD, PhD, FCAP (Electronic Signature on File)    Abnormal Protein Band2 NOT DET g/dL   Abnormal Protein Band3 NOT DET g/dL  Reflex Blast Screen     Status: None   Collection Time: 05/13/16  3:25 PM  Result Value Ref Range   Interpretation: SEE NOTE     Comment:  See Immunophenotyping Interpretation above.   Antigens tested in the blast panel include: CD33,CD34,CD117,and HLADR.   This test was developed and its analytical performance characteristics have been determined by [facility].  It has not been cleared or approved by the U.S. Food and Drug Administration.  The FDA has determined that such clearance or approval is not necessary. This assay has been validated pursuant to the CLIA regulations and is used for clinical purposes.    HM MAMMOGRAPHY     Status: None   Collection Time: 05/27/16 12:00 AM  Result Value Ref Range   HM Mammogram 0-4 Bi-Rad 0-4 Bi-Rad, Self Reported Normal    RADIOGRAPHIC STUDIES:I review CT imaging from February 15, 2015 with the patient I have personally reviewed the radiological images as listed and agreed with the findings in the report.   ASSESSMENT & PLAN Leukocytosis The cause of her chronic, intermittent leukocytosis is likely reactive in nature, related to stress, infection, intermittent asthma exacerbation or steroid treatment. I reassured the patient that the pattern of leukocytosis is reactive. She does not need further workup.  Anemia in  chronic illness This is likely anemia of chronic disease. The patient denies recent history of bleeding such as epistaxis, hematuria or hematochezia. She is asymptomatic from the anemia. We will observe for now.  She does not require transfusion now.    Morbid obesity with BMI of 45.0-49.9, adult [facility]) The patient is morbidly obese with intermittent epigastric discomfort. She had extensive workup in September 2016 which excluded pulmonary emboli or cardiac related issue. She is noted to have hiatal hernia and I wonder whether her intermittent epigastric discomfort could be related to reflux disease. I recommend dietary modification and avoid NSAID. If her symptoms continue to persist, I recommend she contact her primary care doctor for further workup and management.    All questions were answered. The patient knows to call the clinic with any problems, questions or concerns. I spent 30 minutes counseling the patient face to face. The total time spent in the appointment was 45 minutes and more than 50% was on counseling.  Verania Salberg, MD

## 2016-06-09 NOTE — Assessment & Plan Note (Signed)
This is likely anemia of chronic disease. The patient denies recent history of bleeding such as epistaxis, hematuria or hematochezia. She is asymptomatic from the anemia. We will observe for now.  She does not require transfusion now.   

## 2016-06-09 NOTE — Assessment & Plan Note (Signed)
The patient is morbidly obese with intermittent epigastric discomfort. She had extensive workup in September 2016 which excluded pulmonary emboli or cardiac related issue. She is noted to have hiatal hernia and I wonder whether her intermittent epigastric discomfort could be related to reflux disease. I recommend dietary modification and avoid NSAID. If her symptoms continue to persist, I recommend she contact her primary care doctor for further workup and management.

## 2016-06-17 DIAGNOSIS — G4733 Obstructive sleep apnea (adult) (pediatric): Secondary | ICD-10-CM | POA: Diagnosis not present

## 2016-07-18 ENCOUNTER — Other Ambulatory Visit: Payer: Self-pay | Admitting: Internal Medicine

## 2016-07-29 DIAGNOSIS — G4733 Obstructive sleep apnea (adult) (pediatric): Secondary | ICD-10-CM | POA: Diagnosis not present

## 2016-09-16 DIAGNOSIS — Z961 Presence of intraocular lens: Secondary | ICD-10-CM | POA: Diagnosis not present

## 2016-09-16 DIAGNOSIS — H40013 Open angle with borderline findings, low risk, bilateral: Secondary | ICD-10-CM | POA: Diagnosis not present

## 2016-09-16 DIAGNOSIS — H25811 Combined forms of age-related cataract, right eye: Secondary | ICD-10-CM | POA: Diagnosis not present

## 2016-09-17 ENCOUNTER — Other Ambulatory Visit: Payer: Self-pay | Admitting: Internal Medicine

## 2016-09-17 LAB — HM DIABETES EYE EXAM

## 2016-09-23 ENCOUNTER — Other Ambulatory Visit: Payer: PPO

## 2016-09-23 ENCOUNTER — Ambulatory Visit (INDEPENDENT_AMBULATORY_CARE_PROVIDER_SITE_OTHER): Payer: PPO | Admitting: Internal Medicine

## 2016-09-23 ENCOUNTER — Encounter: Payer: Self-pay | Admitting: Internal Medicine

## 2016-09-23 ENCOUNTER — Other Ambulatory Visit (INDEPENDENT_AMBULATORY_CARE_PROVIDER_SITE_OTHER): Payer: PPO

## 2016-09-23 VITALS — BP 120/70 | HR 80 | Temp 98.5°F | Resp 16 | Ht 66.0 in | Wt 270.0 lb

## 2016-09-23 DIAGNOSIS — D638 Anemia in other chronic diseases classified elsewhere: Secondary | ICD-10-CM

## 2016-09-23 DIAGNOSIS — F418 Other specified anxiety disorders: Secondary | ICD-10-CM

## 2016-09-23 DIAGNOSIS — Z23 Encounter for immunization: Secondary | ICD-10-CM | POA: Diagnosis not present

## 2016-09-23 DIAGNOSIS — D508 Other iron deficiency anemias: Secondary | ICD-10-CM | POA: Diagnosis not present

## 2016-09-23 DIAGNOSIS — M48061 Spinal stenosis, lumbar region without neurogenic claudication: Secondary | ICD-10-CM

## 2016-09-23 DIAGNOSIS — E890 Postprocedural hypothyroidism: Secondary | ICD-10-CM

## 2016-09-23 DIAGNOSIS — I10 Essential (primary) hypertension: Secondary | ICD-10-CM

## 2016-09-23 DIAGNOSIS — E118 Type 2 diabetes mellitus with unspecified complications: Secondary | ICD-10-CM

## 2016-09-23 DIAGNOSIS — Z6841 Body Mass Index (BMI) 40.0 and over, adult: Secondary | ICD-10-CM

## 2016-09-23 LAB — TSH: TSH: 1.79 u[IU]/mL (ref 0.35–4.50)

## 2016-09-23 LAB — COMPREHENSIVE METABOLIC PANEL
ALT: 22 U/L (ref 0–35)
AST: 20 U/L (ref 0–37)
Albumin: 4 g/dL (ref 3.5–5.2)
Alkaline Phosphatase: 76 U/L (ref 39–117)
BUN: 10 mg/dL (ref 6–23)
CO2: 29 mEq/L (ref 19–32)
Calcium: 9.5 mg/dL (ref 8.4–10.5)
Chloride: 102 mEq/L (ref 96–112)
Creatinine, Ser: 0.76 mg/dL (ref 0.40–1.20)
GFR: 97.17 mL/min (ref >60.00)
Glucose, Bld: 120 mg/dL — ABNORMAL HIGH (ref 70–99)
Potassium: 4 mEq/L (ref 3.5–5.1)
Sodium: 142 mEq/L (ref 135–145)
Total Bilirubin: 0.4 mg/dL (ref 0.2–1.2)
Total Protein: 7.5 g/dL (ref 6.0–8.3)

## 2016-09-23 LAB — HEMOGLOBIN A1C: Hgb A1c MFr Bld: 7.5 % — ABNORMAL HIGH (ref 4.6–6.5)

## 2016-09-23 LAB — CBC WITH DIFFERENTIAL/PLATELET
Basophils Absolute: 0.1 K/uL (ref 0.0–0.1)
Basophils Relative: 0.7 % (ref 0.0–3.0)
Eosinophils Absolute: 0.3 K/uL (ref 0.0–0.7)
Eosinophils Relative: 2.4 % (ref 0.0–5.0)
HCT: 33 % — ABNORMAL LOW (ref 36.0–46.0)
Hemoglobin: 11 g/dL — ABNORMAL LOW (ref 12.0–15.0)
Lymphocytes Relative: 22 % (ref 12.0–46.0)
Lymphs Abs: 2.9 K/uL (ref 0.7–4.0)
MCHC: 33.3 g/dL (ref 30.0–36.0)
MCV: 86.3 fl (ref 78.0–100.0)
Monocytes Absolute: 0.9 K/uL (ref 0.1–1.0)
Monocytes Relative: 6.8 % (ref 3.0–12.0)
Neutro Abs: 8.9 K/uL — ABNORMAL HIGH (ref 1.4–7.7)
Neutrophils Relative %: 68.1 % (ref 43.0–77.0)
Platelets: 356 K/uL (ref 150.0–400.0)
RBC: 3.83 Mil/uL — ABNORMAL LOW (ref 3.87–5.11)
RDW: 15.3 % (ref 11.5–15.5)
WBC: 13 K/uL — ABNORMAL HIGH (ref 4.0–10.5)

## 2016-09-23 LAB — FERRITIN: Ferritin: 58.1 ng/mL (ref 10.0–291.0)

## 2016-09-23 LAB — IBC PANEL
Iron: 56 ug/dL (ref 42–145)
Saturation Ratios: 14.9 % — ABNORMAL LOW (ref 20.0–50.0)
Transferrin: 268 mg/dL (ref 212.0–360.0)

## 2016-09-23 MED ORDER — ALPRAZOLAM 0.25 MG PO TABS: 0.2500 mg | ORAL_TABLET | Freq: Two times a day (BID) | ORAL | 5 refills | Status: DC | PRN

## 2016-09-23 MED ORDER — ZOSTER VACCINE LIVE 19400 UNT/0.65ML ~~LOC~~ SUSR: 0.6500 mL | Freq: Once | SUBCUTANEOUS | 0 refills | Status: AC

## 2016-09-23 MED ORDER — OXYCODONE-ACETAMINOPHEN 7.5-325 MG PO TABS: 1.0000 | ORAL_TABLET | Freq: Four times a day (QID) | ORAL | 0 refills | Status: DC | PRN

## 2016-09-23 MED ORDER — AMLODIPINE BESYLATE 10 MG PO TABS: 10.0000 mg | ORAL_TABLET | Freq: Every day | ORAL | 0 refills | Status: DC

## 2016-09-23 NOTE — Progress Notes (Signed)
Pre visit review using our clinic review tool, if applicable. No additional management support is needed unless otherwise documented below in the visit note. 

## 2016-09-23 NOTE — Patient Instructions (Signed)

## 2016-09-23 NOTE — Progress Notes (Signed)
Subjective:  Patient ID: Valerie [name], female    DOB: 08/17/1946  Age: 70 y.o. MRN: [id number]  CC: Hypertension; Diabetes; and Anemia   HPI [name] presents for fllow-up, she complains of persistent episodes of stress/anxiety/and panic. Sometimes her panic is so severe that she develops diffuse chest pain. She has been thoroughly evaluated by cardiology and does not have cardiovascular disease. She otherwise feels well and offers no new or different complaints today.  Outpatient Medications Prior to Visit  Medication Sig Dispense Refill  . albuterol (PROVENTIL) (2.5 MG/3ML) 0.083% nebulizer solution     . albuterol (VENTOLIN HFA) 108 (90 BASE) MCG/ACT inhaler Inhale 2 puffs into the lungs every 6 (six) hours as needed. For shortness of breath 3 Inhaler 4  . aspirin 81 MG tablet Take 81 mg by mouth daily.    [name] atorvastatin (LIPITOR) 20 MG tablet Take 1 tablet (20 mg total) by mouth daily. 90 tablet 3  . carvedilol (COREG) 25 MG tablet TAKE ONE TABLET BY MOUTH TWICE DAILY WITH MEALS 180 tablet 3  . ferrous sulfate 325 (65 FE) MG tablet Take 1 tablet (325 mg total) by mouth 2 (two) times daily with a meal. 60 tablet 11  . fluticasone furoate-vilanterol (BREO ELLIPTA) 200-25 MCG/INH AEPB Inhale 1 puff into the lungs daily. 30 each 11  . gabapentin (NEURONTIN) 300 MG capsule TAKE ONE CAPSULE BY MOUTH THREE TIMES DAILY 90 capsule 3  . glucose blood (FREESTYLE TEST STRIPS) test strip TEST BLOOD SUGAR UP TO THREE TIMES DAILY. 100 each 11  . Insulin Pen Needle 31G X 5 MM MISC Use once daily with insulin 100 each 3  . latanoprost (XALATAN) 0.005 % ophthalmic solution Place 1 drop into both eyes at bedtime.    [name] losartan-hydrochlorothiazide (HYZAAR) 100-12.5 MG tablet TAKE ONE TABLET BY MOUTH ONCE DAILY 90 tablet 3  . omeprazole (PRILOSEC) 40 MG capsule Take 1 capsule (40 mg total) by mouth daily. 90 capsule 3  . sertraline (ZOLOFT) 100 MG tablet Take 1 tablet (100 mg total) by mouth  daily. 90 tablet 3  . SitaGLIPtin-MetFORMIN HCl (JANUMET XR) [phone] MG TB24 Take 1 tablet by mouth daily. 90 tablet 3  . TOUJEO SOLOSTAR 300 UNIT/ML SOPN INJECT 30 UNITS SUBCUTANEOUSLY ONCE DAILY 6 pen 11  . ALPRAZolam (XANAX) 0.25 MG tablet Take 1 tablet (0.25 mg total) by mouth 2 (two) times daily as needed for anxiety. 75 tablet 5  . amLODipine (NORVASC) 10 MG tablet TAKE ONE TABLET BY MOUTH ONCE DAILY 90 tablet 0  . oxyCODONE-acetaminophen (PERCOCET) 7.5-325 MG tablet      No facility-administered medications prior to visit.     ROS Review of Systems  Constitutional: Negative.  Negative for activity change, appetite change, diaphoresis, fatigue and fever.  HENT: Negative.   Eyes: Negative.   Respiratory: Negative.  Negative for cough, choking, chest tightness, shortness of breath and stridor.   Cardiovascular: Positive for chest pain. Negative for palpitations and leg swelling.  Gastrointestinal: Negative.  Negative for abdominal pain, blood in stool, constipation, diarrhea, nausea and vomiting.  Endocrine: Negative.  Negative for cold intolerance, heat intolerance, polyphagia and polyuria.  Genitourinary: Negative.  Negative for difficulty urinating.  Musculoskeletal: Positive for back pain. Negative for arthralgias, joint swelling and neck pain.  Skin: Negative.   Allergic/Immunologic: Negative.   Neurological: Negative.   Hematological: Negative.  Negative for adenopathy. Does not bruise/bleed easily.  Psychiatric/Behavioral: Positive for dysphoric mood. Negative for behavioral problems, confusion, decreased concentration,  hallucinations, self-injury and suicidal ideas. The patient is nervous/anxious. The patient is not hyperactive.        She complains of anxiety and panic that causes chest pressure. This was so severe year ago that she was admitted and had a thorough cardiac evaluation which was normal.    Objective:  BP 120/70 (BP Location: Left Arm, Patient Position:  Sitting, Cuff Size: Normal)   Pulse 80   Temp 98.5 F (36.9 C) (Oral)   Resp 16   Ht 5\' 6"  (1.676 m)   Wt 270 lb (122.5 kg)   SpO2 97%   BMI 43.58 kg/m   BP Readings from Last 3 Encounters:  09/23/16 120/70  06/08/16 (!) 135/58  05/13/16 128/78    Wt Readings from Last 3 Encounters:  09/23/16 270 lb (122.5 kg)  06/08/16 271 lb 12.8 oz (123.3 kg)  05/13/16 276 lb (125.2 kg)    Physical Exam  Constitutional: She is oriented to person, place, and time. No distress.  HENT:  Mouth/Throat: Oropharynx is clear and moist. No oropharyngeal exudate.  Eyes: Conjunctivae are normal. Right eye exhibits no discharge. Left eye exhibits no discharge. No scleral icterus.  Neck: Normal range of motion. Neck supple. No JVD present. No tracheal deviation present. No thyromegaly present.  Cardiovascular: Normal rate, regular rhythm and intact distal pulses.  Exam reveals no gallop and no friction rub.   No murmur heard. Pulmonary/Chest: Effort normal and breath sounds normal. No stridor. No respiratory distress. She has no wheezes. She has no rales. She exhibits no tenderness.  Abdominal: Soft. Bowel sounds are normal. She exhibits no distension and no mass. There is no tenderness. There is no rebound and no guarding.  Musculoskeletal: Normal range of motion. She exhibits no edema, tenderness or deformity.  Lymphadenopathy:    She has no cervical adenopathy.  Neurological: She is oriented to person, place, and time.  Skin: Skin is warm and dry. No rash noted. She is not diaphoretic. No erythema. No pallor.  Psychiatric: She has a normal mood and affect. Her behavior is normal. Judgment and thought content normal.  Vitals reviewed.   Lab Results  Component Value Date   WBC 13.0 (H) 09/23/2016   HGB 11.0 (L) 09/23/2016   HCT 33.0 (L) 09/23/2016   PLT 356.0 09/23/2016   GLUCOSE 120 (H) 09/23/2016   CHOL 137 12/25/2015   TRIG 261.0 (H) 12/25/2015   HDL 48.40 12/25/2015   LDLDIRECT 53.0  12/25/2015   LDLCALC 43 03/28/2014   ALT 22 09/23/2016   AST 20 09/23/2016   NA 142 09/23/2016   K 4.0 09/23/2016   CL 102 09/23/2016   CREATININE 0.76 09/23/2016   BUN 10 09/23/2016   CO2 29 09/23/2016   TSH 1.79 09/23/2016   INR 1.06 08/29/2015   HGBA1C 7.5 (H) 09/23/2016   MICROALBUR 5.8 (H) 12/25/2015    Dg Chest 2 View  Result Date: 04/16/2016 CLINICAL DATA:  Recent diagnosis of pneumonia. Coughing and wheezing and shortness of breath for 2 weeks. History of hypertension. EXAM: CHEST  2 VIEW COMPARISON:  08/29/2015 FINDINGS: Cardiac silhouette is top-normal in size. No mediastinal or hilar masses or evidence of adenopathy. Clear lungs.  No pleural effusion or pneumothorax. Bony thorax is intact. IMPRESSION: No active cardiopulmonary disease. Electronically Signed   By: Lajean Manes M.D.   On: 04/16/2016 15:36    Assessment & Plan:   Genesi was seen today for hypertension, diabetes and anemia.  Diagnoses and all orders for this  visit:  Essential hypertension, benign- her blood pressure is well-controlled, electrolytes and renal function are stable. -     Comprehensive metabolic panel; Future  Postoperative hypothyroidism- her TSH is in the normal range, she will remain on the current dose of levothyroxine. -     TSH; Future  Anemia in chronic illness - She has a persistently elevated white blood cell count, chronic anemia and now low platelets, I've asked her to see hematology to see if there is any concern for an infiltrating bone marrow process. -     CBC with Differential/Platelet; Future  Other iron deficiency anemia- as above -     CBC with Differential/Platelet; Future -     Ferritin; Future -     IBC panel; Future  Morbid obesity with BMI of 45.0-49.9, adult [facility])- she is working on her lifestyle modifications to lose weight.  Type 2 diabetes mellitus with complication, without long-term current use of insulin ([place])- her A1c is down to 7.5%, this is improvement  for her, will continue current medications for blood sugar control. -     Hemoglobin A1c; Future -     Cancel: Varicella zoster antibody, IgG; Future  Spinal stenosis of lumbar 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- she will continue Percocet as needed for control of the back pain. -     oxyCODONE-acetaminophen (PERCOCET) 7.5-325 MG tablet; Take 1 tablet by mouth every 6 (six) hours as needed for severe pain.  Depression with anxiety -     ALPRAZolam (XANAX) 0.25 MG tablet; Take 1 tablet (0.25 mg total) by mouth 2 (two) times daily as needed for anxiety.  Need for prophylactic vaccination and inoculation against influenza -     Flu vaccine HIGH DOSE PF (Fluzone High dose)  Other orders -     amLODipine (NORVASC) 10 MG tablet; Take 1 tablet (10 mg total) by mouth daily. -     Zoster Vaccine Live, PF, (ZOSTAVAX) [id number] UNT/0.65ML injection; Inject 19,400 Units into the skin once.   I have changed Ms. Mccroskey's amLODipine and oxyCODONE-acetaminophen. I am also having her start on Zoster Vaccine Live (PF). Additionally, I am having her maintain her latanoprost, albuterol, aspirin, Insulin Pen Needle, glucose blood, omeprazole, atorvastatin, carvedilol, SitaGLIPtin-MetFORMIN HCl, gabapentin, TOUJEO SOLOSTAR, albuterol, fluticasone furoate-vilanterol, ferrous sulfate, sertraline, losartan-hydrochlorothiazide, and ALPRAZolam.  Meds ordered this encounter  Medications  . amLODipine (NORVASC) 10 MG tablet    Sig: Take 1 tablet (10 mg total) by mouth daily.    Dispense:  90 tablet    Refill:  0  . oxyCODONE-acetaminophen (PERCOCET) 7.5-325 MG tablet    Sig: Take 1 tablet by mouth every 6 (six) hours as needed for severe pain.    Dispense:  90 tablet    Refill:  0  . ALPRAZolam (XANAX) 0.25 MG tablet    Sig: Take 1 tablet (0.25 mg total) by mouth 2 (two) times daily as needed for anxiety.    Dispense:  75 tablet    Refill:  5  . Zoster Vaccine Live, PF, (ZOSTAVAX) [id number] UNT/0.65ML injection    Sig: Inject  19,400 Units into the skin once.    Dispense:  1 each    Refill:  0     Follow-up: Return in about 6 months (around 03/24/2017).  [name], MD

## 2016-09-24 LAB — VARICELLA ZOSTER ANTIBODY, IGG: Varicella IgG: 1273 Index — ABNORMAL HIGH (ref <135.00)

## 2016-10-07 ENCOUNTER — Telehealth: Payer: Self-pay | Admitting: *Deleted

## 2016-10-07 NOTE — Telephone Encounter (Signed)
This pt has not been seen by Dr. Brett Fairy since 11/2013 and she wants another appt.(Being seen for OSA). She wants to be seen sooner than December of 2017 so that she will avoid having to get another referral. I do believe I can work this pt in sooner that December, if Dr. Brett Fairy is agreeable.

## 2016-10-08 NOTE — Telephone Encounter (Signed)
She has seen Dr Jannifer Franklin in April 2015 and is therfore an active patient from there on out for 3 years.   I believe her primary neurologist is Dr Jannifer Franklin, does she need to be seen for sleep or other complaints.  If sleep, put her in where you can find a slot. Thank You.

## 2016-10-08 NOTE — Telephone Encounter (Signed)
I spoke to pt. I offered her an appt with Dr. Brett Fairy on 11/14 at 8:00am but she declined. I offered her an appt on 11/20 at 11:30 and pt accepted that one. I asked her to bring her cpap. Pt verbalized understanding of appt date and time.

## 2016-10-10 DIAGNOSIS — G4733 Obstructive sleep apnea (adult) (pediatric): Secondary | ICD-10-CM | POA: Diagnosis not present

## 2016-10-14 ENCOUNTER — Other Ambulatory Visit: Payer: Self-pay | Admitting: Internal Medicine

## 2016-10-14 DIAGNOSIS — E118 Type 2 diabetes mellitus with unspecified complications: Secondary | ICD-10-CM

## 2016-10-16 ENCOUNTER — Telehealth: Payer: Self-pay | Admitting: *Deleted

## 2016-10-16 ENCOUNTER — Telehealth: Payer: Self-pay | Admitting: Internal Medicine

## 2016-10-16 ENCOUNTER — Other Ambulatory Visit: Payer: Self-pay | Admitting: Internal Medicine

## 2016-10-16 NOTE — Telephone Encounter (Signed)
Patient would like to know why she was referred to hematologist again.

## 2016-10-16 NOTE — Telephone Encounter (Signed)
-----   Message from Heath Lark, MD sent at 10/16/2016  9:03 AM EDT ----- I explained to her she can just follow with PCP for labs monitoring She does not need any Rx for anemia and her elevated WBC is benign.  ----- Message ----- From: Patton Salles, RN Sent: 10/16/2016   8:55 AM To: Heath Lark, MD  Pt is wondering if she needs the appt with you in November, thought she was no further appts. Please advise Armen Waring

## 2016-10-16 NOTE — Telephone Encounter (Signed)
She was referred for a persistently elevated white blood cell count.  The Janumet samples will be here Monday morning

## 2016-10-16 NOTE — Telephone Encounter (Signed)
Pt notified of Dr Alvy Bimler message. Will follow up with PCP.

## 2016-10-16 NOTE — Telephone Encounter (Signed)
Patient also would like to know if Dr. Ronnald Ramp has any samples of Janumet.

## 2016-10-20 NOTE — Telephone Encounter (Signed)
Pt informed that Janumet is up front for her to pick up. Pt stated that hemotology did not need her to return for another appt. Forwarding to [place] as an [place].

## 2016-11-01 ENCOUNTER — Encounter: Payer: Self-pay | Admitting: Neurology

## 2016-11-02 ENCOUNTER — Encounter: Payer: Self-pay | Admitting: Neurology

## 2016-11-02 ENCOUNTER — Ambulatory Visit (INDEPENDENT_AMBULATORY_CARE_PROVIDER_SITE_OTHER): Payer: PPO | Admitting: Neurology

## 2016-11-02 VITALS — BP 139/79 | HR 79 | Resp 20 | Ht 66.0 in | Wt 269.0 lb

## 2016-11-02 DIAGNOSIS — G4731 Primary central sleep apnea: Principal | ICD-10-CM

## 2016-11-02 DIAGNOSIS — G4733 Obstructive sleep apnea (adult) (pediatric): Secondary | ICD-10-CM

## 2016-11-02 DIAGNOSIS — G4739 Other sleep apnea: Secondary | ICD-10-CM | POA: Diagnosis not present

## 2016-11-02 NOTE — Progress Notes (Signed)
[facility]  Provider:  Dr Jerame Hedding Referring Provider: Janith Lima, MD Primary Care Physician:  [name], MD  Chief Complaint  Patient presents with  . Follow-up    cpap, uses [facility]    HPI:  Valerie [name] is a 70 y.o. female here as a referral from Dr. Ronnald Ramp / Dr Jannifer Franklin.  I have the pleasure of seeing Valerie [name] today, who was last seen 35 months ago. The patient brought her compliance data for her BiPAP machine with her and they're excellent she has used the machine 100% of the last 30 days on average 8 hours and 42 minutes, residual AHI is 0.3. The machine is set at 13/9 cm water with breeze function.   The patient is followed by [name].  She feels that her machine makes a significant difference, her Epworth sleepiness score is 3 points while  using BiPAP, she's not excessively daytime fatigue, she endorsed the geriatric depression score at 6/15 points, basically related to her caregiver burden.   Husband had cardiac complaints , suffered from post dialysis chest pain. 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 Emergency stent placed, but now he is unable from the CAD stand point to undergo necessary knee replacement.  Her husband is dependent on hemodialysis, and she feels that it is difficult to take care of herself , when her husband and son need her so much. Her son is a 47 year old quadriparetic.        History:  09-14-2013 [name] is a 70 year old right-handed black female with a history of obesity, and obstructive sleep apnea on CPAP. The patient has report some excessive daytime drowsiness and some problems with memory and concentration. The patient has had no improvement of her memory, but she indicates that she has become intolerant of the CPAP mask, and she has not been able to use her CPAP machine for about one month. The patient has recently had left foot surgery. MRI done recently showed some  minimal small vessel disease. The patient reports that in March of 2014, she had sudden onset of left-sided sensory changes with numbness, and she is also had some discomfort in the same distribution. The patient went to the emergency room, and MRI evaluation of the brain did not show an acute stroke. The patient is not aspirin or Plavix. The patient has some burning sensations of the left side of the face at times.  The patient comes to this office for an evaluation. Sleep Consult note : Valerie [name] is a mean of 70 year old African American right-handed lady patient of Dr. Floyde Parkins and Dr. Regis Bill,  [name]  The patient has a past medical history of morbid obesity and sleep apnea for which Dr. Jannifer Franklin referred her.  She reported increasing fatigue and excessive daytime sleepiness .  Dr. Jannifer Franklin had evaluated her memory complaints and found small vessel disease to a mild extent on a CAT scan.  The patient was supposed to see Dr. Jannifer Franklin in February  2014. I have seen Valerie [name] in that MD December 2013, for a sleep consult proceeding the study. The following PSG revealed a very high AHI of 76.1 she owns a BiPAP that was set at 17/11 cm water for a sleep study also short severe oxygen desaturations at night. At worst is today points on a, so she is no longer excessively daytime sleepy but if her fatigue severity is and/or step  63 points, the maximum. The patient is titration showed that she had at the very best response to 13 cm water over 8 cm in a BiPAP function the patient had failed a nasal mask before , thus  preferred to the full face mask. At pulse oximetry done on BiPAP revealed hypoxemia, which was  not corrected. The patient's revisit was scheduled for December 2014 for a compliance visit .  The patient reports that her husband complains of her ongoing snoring while on BiPAP. Valerie [name] underwent BiPAP - titration on 11-15-13 upon referral of Dr. Jannifer Franklin. We suspected that the patient may  suffer from obesity hypoventilation . She brought her previous used BiPAP to the sleep laboratory, which was found to be set at 17 /11 cm water at the time the patient on this machine it had been reset to 13/8 cm water after her sleep study in 2013.  Her current  BiPAP machine is non functional, and is too old to have downloadable capacity, and also has begun to make noises and is unreliable at night seemingly switching itself off. This makes the order for a new machine essential . The patient BMI was 44.4. Her  fatigue scale was 63 points,  the PRQR  inventory was not endorsed , the Epworth Sleepiness Scale was 2  points. Next conference measured 70.5 inches. The optimal pressure after this titration seemed to be 13/9 cm. There was still mild snoring altered on 12/8 cm water which lead to further increase in a small increment. The patient slept 89 minutes at this pressure and reached 51 minutes in rem sleep. Her oxygen nadir increased to 94%. The night however and it shortly at 3 hours 20 minutes in AM - the patient could not re-initiate  sleep throughout the night. Fragmentation of sleep had significantly improved during titration prior to that point. REM rebound was noted. We will order a new BiPAP machine for the patient;  to be set at 13 cm water over 9 cm and a new  fullface mask ( if she still desired to uses this model in medium-size). He did humidity as ordered. The backup rate is not needed, compliance will be defined as 4 hours or more of nightly use,  the BiPAP machine is to be brought to all sleep clinic appointments.  Valerie [name] is an 71 y.o. Female disabled patient , since 2004 no longer gainfully employed. 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 Her husband is on Dialysis, which dominates her life, too.    Review of Systems: Out of a complete 14 system review, the patient complains of only the following symptoms, and all other reviewed systems are negative.   Depression. Car giver burden.    Social History   Social History  . Marital status: Married    Spouse name: N/A  . Number of children: 3  . Years of education: 11th   Occupational History  . disabled    Social History Main Topics  . Smoking status: Never Smoker  . Smokeless tobacco: Never Used  . Alcohol use No  . Drug use: No  . Sexual activity: Not Currently     Comment: not working, lives with husband, 3 sons   Other Topics Concern  . Not on file   Social History Narrative   Lives with spouse; spouse disabled with knees; also caregiver for son who is 69 yo and is disabled.    Family History  Problem Relation Age of Onset  . Hypertension  family history  . Alcohol abuse    . Alcohol abuse Mother   . Heart attack Mother   . Coronary artery disease Brother   . Heart attack Father   . Heart disease Sister   . Atrial fibrillation Sister   . Hypertension Sister     Past Medical History:  Diagnosis Date  . Anemia   . Anxiety and depression   . Asthma   . Degenerative arthritis   . Depression   . Diabetes mellitus, type 2 ([place])   . Fatigue   . GERD (gastroesophageal reflux disease)   . Glaucoma   . Hemorrhoids   . Hyperlipidemia   . Hypertension   . Hypothyroid   . Hypoxemia 11/24/2013  . Memory deficit 10/04/2013  . Obesity   . Sleep apnea   . Snoring disorder     Past Surgical History:  Procedure Laterality Date  . benign tumors resected    . CATARACT EXTRACTION Left   . FOOT SURGERY Left    bone spur  . REFRACTIVE SURGERY     Glaucoma  . ROTATOR CUFF REPAIR    . TUBAL LIGATION      Current Outpatient Prescriptions  Medication Sig Dispense Refill  . albuterol (PROVENTIL) (2.5 MG/3ML) 0.083% nebulizer solution     . albuterol (VENTOLIN HFA) 108 (90 BASE) MCG/ACT inhaler Inhale 2 puffs into the lungs every 6 (six) hours as needed. For shortness of breath 3 Inhaler 4  . ALPRAZolam (XANAX) 0.25 MG tablet Take 1 tablet (0.25 mg total) by mouth 2 (two) times daily as  needed for anxiety. 75 tablet 5  . amLODipine (NORVASC) 10 MG tablet Take 1 tablet (10 mg total) by mouth daily. 90 tablet 0  . aspirin 81 MG tablet Take 81 mg by mouth daily.    [name] atorvastatin (LIPITOR) 20 MG tablet Take 1 tablet (20 mg total) by mouth daily. 90 tablet 3  . carvedilol (COREG) 25 MG tablet TAKE ONE TABLET BY MOUTH TWICE DAILY WITH MEALS 180 tablet 3  . ferrous sulfate 325 (65 FE) MG tablet Take 1 tablet (325 mg total) by mouth 2 (two) times daily with a meal. 60 tablet 11  . fluticasone furoate-vilanterol (BREO ELLIPTA) 200-25 MCG/INH AEPB Inhale 1 puff into the lungs daily. 30 each 11  . FREESTYLE TEST STRIPS test strip USE TO TEST BLOOD SUGAR UP TO THREE TIMES DAILY 100 each 11  . gabapentin (NEURONTIN) 300 MG capsule TAKE ONE CAPSULE BY MOUTH THREE TIMES DAILY 90 capsule 3  . Insulin Pen Needle 31G X 5 MM MISC Use once daily with insulin 100 each 3  . latanoprost (XALATAN) 0.005 % ophthalmic solution Place 1 drop into both eyes at bedtime.    [name] losartan-hydrochlorothiazide (HYZAAR) 100-12.5 MG tablet TAKE ONE TABLET BY MOUTH ONCE DAILY 90 tablet 3  . omeprazole (PRILOSEC) 40 MG capsule Take 1 capsule (40 mg total) by mouth daily. 90 capsule 3  . oxyCODONE-acetaminophen (PERCOCET) 7.5-325 MG tablet Take 1 tablet by mouth every 6 (six) hours as needed for severe pain. 90 tablet 0  . sertraline (ZOLOFT) 100 MG tablet Take 1 tablet (100 mg total) by mouth daily. 90 tablet 3  . SitaGLIPtin-MetFORMIN HCl (JANUMET XR) [phone] MG TB24 Take 1 tablet by mouth daily. 90 tablet 3  . TOUJEO SOLOSTAR 300 UNIT/ML SOPN INJECT 30 UNITS SUBCUTANEOUSLY ONCE DAILY 6 pen 11   No current facility-administered medications for this visit.     Allergies as of 11/02/2016 -  Review Complete 11/02/2016  Allergen Reaction Noted  . Food Swelling 05/04/2012  . Penicillins Swelling and Rash     Vitals: BP 139/79   Pulse 79   Resp 20   Ht 5\' 6"  (1.676 m)   Wt 269 lb (122 kg)   BMI 43.42 kg/m   Last Weight:  Wt Readings from Last 1 Encounters:  11/02/16 269 lb (122 kg)   Last Height:   Ht Readings from Last 1 Encounters:  11/02/16 5\' 6"  (1.676 m)     Physical exam:  General: The patient is awake, alert and appears not in acute distress. The patient is well groomed. Head: Normocephalic, atraumatic. Neck is supple. Mallampati 3, neck circumference:17,5. Cardiovascular:  Regular rate and rhythm, without  murmurs or carotid bruit, and without distended neck veins. Respiratory: Lungs are clear to auscultation. Skin:  Without evidence of edema, or rash Trunk: BMI  Elevated -, morbidly obese. Mental Status: Alert, oriented, thought content appropriate.  Speech fluent without evidence of aphasia. Able to follow 3 step commands without difficulty. Cranial Nerves:  Right eye cataract, Glaucoma in the left. Left eye status post cataract.  Preserved taste and smell sense, equal pupils, facial symmetry and  facial sensory ,  full visual field for the left, she is in the process of being further worked up by [employer]	[place] and ophthalmology.    Past Medical History:  Diagnosis Date  . Anemia   . Anxiety and depression   . Asthma   . Degenerative arthritis   . Depression   . Diabetes mellitus, type 2 ([place])   . Fatigue   . GERD (gastroesophageal reflux disease)   . Glaucoma   . Hemorrhoids   . Hyperlipidemia   . Hypertension   . Hypothyroid   . Hypoxemia 11/24/2013  . Memory deficit 10/04/2013  . Obesity   . Sleep apnea   . Snoring disorder     Past Surgical History:  Procedure Laterality Date  . benign tumors resected    . CATARACT EXTRACTION Left   . FOOT SURGERY Left    bone spur  . REFRACTIVE SURGERY     Glaucoma  . ROTATOR CUFF REPAIR    . TUBAL LIGATION      Family History  Problem Relation Age of Onset  . Hypertension      family history  . Alcohol abuse    . Alcohol abuse Mother   . Heart attack Mother   . Coronary artery disease Brother   . Heart  attack Father   . Heart disease Sister   . Atrial fibrillation Sister   . Hypertension Sister     Social history:  reports that she has never smoked. She has never used smokeless tobacco. She reports that she does not drink alcohol or use drugs.    Allergies  Allergen Reactions  . Food Swelling    bananas  . Penicillins Swelling and Rash    Medications:  Current Outpatient Prescriptions on File Prior to Visit  Medication Sig Dispense Refill  . albuterol (PROVENTIL) (2.5 MG/3ML) 0.083% nebulizer solution     . albuterol (VENTOLIN HFA) 108 (90 BASE) MCG/ACT inhaler Inhale 2 puffs into the lungs every 6 (six) hours as needed. For shortness of breath 3 Inhaler 4  . ALPRAZolam (XANAX) 0.25 MG tablet Take 1 tablet (0.25 mg total) by mouth 2 (two) times daily as needed for anxiety. 75 tablet 5  . amLODipine (NORVASC) 10 MG tablet Take 1 tablet (10 mg total) by  mouth daily. 90 tablet 0  . aspirin 81 MG tablet Take 81 mg by mouth daily.    [name] atorvastatin (LIPITOR) 20 MG tablet Take 1 tablet (20 mg total) by mouth daily. 90 tablet 3  . carvedilol (COREG) 25 MG tablet TAKE ONE TABLET BY MOUTH TWICE DAILY WITH MEALS 180 tablet 3  . ferrous sulfate 325 (65 FE) MG tablet Take 1 tablet (325 mg total) by mouth 2 (two) times daily with a meal. 60 tablet 11  . fluticasone furoate-vilanterol (BREO ELLIPTA) 200-25 MCG/INH AEPB Inhale 1 puff into the lungs daily. 30 each 11  . FREESTYLE TEST STRIPS test strip USE TO TEST BLOOD SUGAR UP TO THREE TIMES DAILY 100 each 11  . gabapentin (NEURONTIN) 300 MG capsule TAKE ONE CAPSULE BY MOUTH THREE TIMES DAILY 90 capsule 3  . Insulin Pen Needle 31G X 5 MM MISC Use once daily with insulin 100 each 3  . latanoprost (XALATAN) 0.005 % ophthalmic solution Place 1 drop into both eyes at bedtime.    [name] losartan-hydrochlorothiazide (HYZAAR) 100-12.5 MG tablet TAKE ONE TABLET BY MOUTH ONCE DAILY 90 tablet 3  . omeprazole (PRILOSEC) 40 MG capsule Take 1 capsule (40 mg  total) by mouth daily. 90 capsule 3  . oxyCODONE-acetaminophen (PERCOCET) 7.5-325 MG tablet Take 1 tablet by mouth every 6 (six) hours as needed for severe pain. 90 tablet 0  . sertraline (ZOLOFT) 100 MG tablet Take 1 tablet (100 mg total) by mouth daily. 90 tablet 3  . SitaGLIPtin-MetFORMIN HCl (JANUMET XR) [phone] MG TB24 Take 1 tablet by mouth daily. 90 tablet 3  . TOUJEO SOLOSTAR 300 UNIT/ML SOPN INJECT 30 UNITS SUBCUTANEOUSLY ONCE DAILY 6 pen 11   No current facility-administered medications on file prior to visit.    Valerie [name] is a compliant user of her BiPAP machine, she had a treatment emergent central apnea diagnosis, her baseline study had shown obstructive sleep apnea mainly. She can only tolerate BiPAP therapy and likes the current settings. She's 100% compliant I will see her from now on once a year. [facility]   Patient uses a new [place] full face mask. Medium size,  Refill - send order for new supplies.  .   The BiPAP machine is ordered at 13 over 9 cm water , has WIFi and downloadable capacity , is used with  humdifier        Primary Neurologist is Dr Floyde Parkins, MD , who follows for memory disorder.   [facility] [address] [place], [place] [address]  Phone [phone] Fax [phone]

## 2016-11-04 ENCOUNTER — Telehealth: Payer: Self-pay | Admitting: Neurology

## 2016-11-04 NOTE — Telephone Encounter (Signed)
Valerie [name] says they rec'd an order for CPAP supplies. She talked with pt and she got CPAP from [facility]. Please send order to [facility]

## 2016-11-04 NOTE — Telephone Encounter (Signed)
Patient received a call from [facility] regarding an order from our office for medical supplies for her CPAP but the patient states she uses [facility]. Please send order to [place].

## 2016-11-04 NOTE — Telephone Encounter (Signed)
Order sent to [facility]

## 2016-11-10 ENCOUNTER — Other Ambulatory Visit: Payer: PPO

## 2016-11-10 ENCOUNTER — Ambulatory Visit: Payer: PPO | Admitting: Hematology and Oncology

## 2016-11-12 ENCOUNTER — Other Ambulatory Visit: Payer: Self-pay | Admitting: Internal Medicine

## 2016-11-19 DIAGNOSIS — G4733 Obstructive sleep apnea (adult) (pediatric): Secondary | ICD-10-CM | POA: Diagnosis not present

## 2016-12-25 ENCOUNTER — Other Ambulatory Visit: Payer: Self-pay | Admitting: Internal Medicine

## 2016-12-25 DIAGNOSIS — Z794 Long term (current) use of insulin: Secondary | ICD-10-CM

## 2016-12-25 DIAGNOSIS — E118 Type 2 diabetes mellitus with unspecified complications: Secondary | ICD-10-CM

## 2016-12-25 NOTE — Telephone Encounter (Signed)
Pt is needs her SitaGLIPtin-MetFORMIN HCl (JANUMET XR) [phone] MG TB24 [id number]    Pt is completely out of this med and needs a refill called in today   [facility] on file

## 2017-01-28 DIAGNOSIS — G4733 Obstructive sleep apnea (adult) (pediatric): Secondary | ICD-10-CM | POA: Diagnosis not present

## 2017-03-10 DIAGNOSIS — G4733 Obstructive sleep apnea (adult) (pediatric): Secondary | ICD-10-CM | POA: Diagnosis not present

## 2017-03-24 ENCOUNTER — Ambulatory Visit (INDEPENDENT_AMBULATORY_CARE_PROVIDER_SITE_OTHER): Payer: PPO | Admitting: Internal Medicine

## 2017-03-24 ENCOUNTER — Encounter: Payer: Self-pay | Admitting: Internal Medicine

## 2017-03-24 ENCOUNTER — Other Ambulatory Visit (INDEPENDENT_AMBULATORY_CARE_PROVIDER_SITE_OTHER): Payer: PPO

## 2017-03-24 ENCOUNTER — Ambulatory Visit: Payer: PPO | Admitting: Internal Medicine

## 2017-03-24 VITALS — BP 168/82 | HR 91 | Temp 98.1°F | Resp 16 | Ht 66.0 in | Wt 268.0 lb

## 2017-03-24 DIAGNOSIS — E785 Hyperlipidemia, unspecified: Secondary | ICD-10-CM

## 2017-03-24 DIAGNOSIS — D72829 Elevated white blood cell count, unspecified: Secondary | ICD-10-CM

## 2017-03-24 DIAGNOSIS — G4733 Obstructive sleep apnea (adult) (pediatric): Secondary | ICD-10-CM | POA: Diagnosis not present

## 2017-03-24 DIAGNOSIS — M48061 Spinal stenosis, lumbar region without neurogenic claudication: Secondary | ICD-10-CM | POA: Diagnosis not present

## 2017-03-24 DIAGNOSIS — K2100 Gastro-esophageal reflux disease with esophagitis, without bleeding: Secondary | ICD-10-CM

## 2017-03-24 DIAGNOSIS — I10 Essential (primary) hypertension: Secondary | ICD-10-CM | POA: Diagnosis not present

## 2017-03-24 DIAGNOSIS — D508 Other iron deficiency anemias: Secondary | ICD-10-CM | POA: Diagnosis not present

## 2017-03-24 DIAGNOSIS — E118 Type 2 diabetes mellitus with unspecified complications: Secondary | ICD-10-CM

## 2017-03-24 DIAGNOSIS — E038 Other specified hypothyroidism: Secondary | ICD-10-CM | POA: Diagnosis not present

## 2017-03-24 DIAGNOSIS — F418 Other specified anxiety disorders: Secondary | ICD-10-CM | POA: Diagnosis not present

## 2017-03-24 LAB — URINALYSIS, ROUTINE W REFLEX MICROSCOPIC
Bilirubin Urine: NEGATIVE
Hgb urine dipstick: NEGATIVE
Ketones, ur: NEGATIVE
Leukocytes, UA: NEGATIVE
Nitrite: NEGATIVE
Specific Gravity, Urine: <=1.005 — AB (ref 1.000–1.030)
Total Protein, Urine: NEGATIVE
Urine Glucose: NEGATIVE
Urobilinogen, UA: 0.2 (ref 0.0–1.0)
pH: 6 (ref 5.0–8.0)

## 2017-03-24 LAB — TSH: TSH: 2.78 u[IU]/mL (ref 0.35–4.50)

## 2017-03-24 LAB — LIPID PANEL
Cholesterol: 245 mg/dL — ABNORMAL HIGH (ref 0–200)
HDL: 42.8 mg/dL (ref >39.00)
NonHDL: 201.75
Total CHOL/HDL Ratio: 6
Triglycerides: 368 mg/dL — ABNORMAL HIGH (ref 0.0–149.0)
VLDL: 73.6 mg/dL — ABNORMAL HIGH (ref 0.0–40.0)

## 2017-03-24 LAB — CBC WITH DIFFERENTIAL/PLATELET
Basophils Absolute: 0.2 10*3/uL — ABNORMAL HIGH (ref 0.0–0.1)
Basophils Relative: 1.5 % (ref 0.0–3.0)
Eosinophils Absolute: 0.2 10*3/uL (ref 0.0–0.7)
Eosinophils Relative: 1.6 % (ref 0.0–5.0)
HCT: 32 % — ABNORMAL LOW (ref 36.0–46.0)
Hemoglobin: 10.6 g/dL — ABNORMAL LOW (ref 12.0–15.0)
Lymphocytes Relative: 28.1 % (ref 12.0–46.0)
Lymphs Abs: 3.2 10*3/uL (ref 0.7–4.0)
MCHC: 33.1 g/dL (ref 30.0–36.0)
MCV: 87.6 fl (ref 78.0–100.0)
Monocytes Absolute: 1 10*3/uL (ref 0.1–1.0)
Monocytes Relative: 9.1 % (ref 3.0–12.0)
Neutro Abs: 6.7 10*3/uL (ref 1.4–7.7)
Neutrophils Relative %: 59.7 % (ref 43.0–77.0)
Platelets: 332 10*3/uL (ref 150.0–400.0)
RBC: 3.65 Mil/uL — ABNORMAL LOW (ref 3.87–5.11)
RDW: 15.9 % — ABNORMAL HIGH (ref 11.5–15.5)
WBC: 11.3 10*3/uL — ABNORMAL HIGH (ref 4.0–10.5)

## 2017-03-24 LAB — COMPREHENSIVE METABOLIC PANEL
ALT: 30 U/L (ref 0–35)
AST: 31 U/L (ref 0–37)
Albumin: 4.2 g/dL (ref 3.5–5.2)
Alkaline Phosphatase: 72 U/L (ref 39–117)
BUN: 14 mg/dL (ref 6–23)
CO2: 27 mEq/L (ref 19–32)
Calcium: 10.4 mg/dL (ref 8.4–10.5)
Chloride: 99 mEq/L (ref 96–112)
Creatinine, Ser: 1.01 mg/dL (ref 0.40–1.20)
GFR: 69.88 mL/min (ref >60.00)
Glucose, Bld: 156 mg/dL — ABNORMAL HIGH (ref 70–99)
Potassium: 3.3 mEq/L — ABNORMAL LOW (ref 3.5–5.1)
Sodium: 139 mEq/L (ref 135–145)
Total Bilirubin: 0.4 mg/dL (ref 0.2–1.2)
Total Protein: 7.3 g/dL (ref 6.0–8.3)

## 2017-03-24 LAB — FERRITIN: Ferritin: 49.5 ng/mL (ref 10.0–291.0)

## 2017-03-24 LAB — IBC PANEL
Iron: 106 ug/dL (ref 42–145)
Saturation Ratios: 26.8 % (ref 20.0–50.0)
Transferrin: 282 mg/dL (ref 212.0–360.0)

## 2017-03-24 LAB — POCT GLYCOSYLATED HEMOGLOBIN (HGB A1C): Hemoglobin A1C: 6.6

## 2017-03-24 LAB — LDL CHOLESTEROL, DIRECT: Direct LDL: 129 mg/dL

## 2017-03-24 LAB — MICROALBUMIN / CREATININE URINE RATIO
Creatinine,U: 47.4 mg/dL
Microalb Creat Ratio: 1.5 mg/g (ref 0.0–30.0)
Microalb, Ur: <0.7 mg/dL (ref 0.0–1.9)

## 2017-03-24 MED ORDER — AMLODIPINE BESYLATE 10 MG PO TABS: 10.0000 mg | ORAL_TABLET | Freq: Every day | ORAL | 1 refills | Status: DC

## 2017-03-24 MED ORDER — VALSARTAN 320 MG PO TABS: 320.0000 mg | ORAL_TABLET | Freq: Every day | ORAL | 1 refills | Status: DC

## 2017-03-24 MED ORDER — OXYCODONE-ACETAMINOPHEN 7.5-325 MG PO TABS: 1.0000 | ORAL_TABLET | Freq: Four times a day (QID) | ORAL | 0 refills | Status: DC | PRN

## 2017-03-24 MED ORDER — CHLORTHALIDONE 25 MG PO TABS: 25.0000 mg | ORAL_TABLET | Freq: Every day | ORAL | 1 refills | Status: DC

## 2017-03-24 MED ORDER — OMEPRAZOLE 40 MG PO CPDR: 40.0000 mg | DELAYED_RELEASE_CAPSULE | Freq: Every day | ORAL | 3 refills | Status: DC

## 2017-03-24 MED ORDER — ALPRAZOLAM 0.25 MG PO TABS: 0.2500 mg | ORAL_TABLET | Freq: Two times a day (BID) | ORAL | 5 refills | Status: DC | PRN

## 2017-03-24 NOTE — Progress Notes (Signed)
Subjective:  Patient ID: Valerie [name], female    DOB: 1946-01-29  Age: 71 y.o. MRN: [id number]  CC: Anemia; Hypertension; Hyperlipidemia; Hypothyroidism; and Diabetes   HPI [name] presents for follow-up on multiple medical problems. She complains that her blood pressure has not well controlled and she has had a few episodes of dizziness and lightheadedness. She denies headache or blurred vision. She also complains that she has not been able to lose weight.  Outpatient Medications Prior to Visit  Medication Sig Dispense Refill  . albuterol (PROVENTIL) (2.5 MG/3ML) 0.083% nebulizer solution     . albuterol (VENTOLIN HFA) 108 (90 BASE) MCG/ACT inhaler Inhale 2 puffs into the lungs every 6 (six) hours as needed. For shortness of breath 3 Inhaler 4  . aspirin 81 MG tablet Take 81 mg by mouth daily.    . carvedilol (COREG) 25 MG tablet TAKE ONE TABLET BY MOUTH TWICE DAILY WITH MEALS 180 tablet 1  . ferrous sulfate 325 (65 FE) MG tablet Take 1 tablet (325 mg total) by mouth 2 (two) times daily with a meal. 60 tablet 11  . fluticasone furoate-vilanterol (BREO ELLIPTA) 200-25 MCG/INH AEPB Inhale 1 puff into the lungs daily. 30 each 11  . FREESTYLE TEST STRIPS test strip USE TO TEST BLOOD SUGAR UP TO THREE TIMES DAILY 100 each 11  . gabapentin (NEURONTIN) 300 MG capsule TAKE ONE CAPSULE BY MOUTH THREE TIMES DAILY 90 capsule 3  . Insulin Pen Needle 31G X 5 MM MISC Use once daily with insulin 100 each 3  . JANUMET XR [id number] MG TB24 TAKE ONE TABLET BY MOUTH ONCE DAILY 30 tablet 5  . latanoprost (XALATAN) 0.005 % ophthalmic solution Place 1 drop into both eyes at bedtime.    . sertraline (ZOLOFT) 100 MG tablet Take 1 tablet (100 mg total) by mouth daily. 90 tablet 3  . TOUJEO SOLOSTAR 300 UNIT/ML SOPN INJECT 30 UNITS SUBCUTANEOUSLY ONCE DAILY 6 pen 11  . ALPRAZolam (XANAX) 0.25 MG tablet Take 1 tablet (0.25 mg total) by mouth 2 (two) times daily as needed for anxiety. 75 tablet 5  .  amLODipine (NORVASC) 10 MG tablet Take 1 tablet (10 mg total) by mouth daily. 90 tablet 0  . atorvastatin (LIPITOR) 20 MG tablet Take 1 tablet (20 mg total) by mouth daily. 90 tablet 3  . losartan-hydrochlorothiazide (HYZAAR) 100-12.5 MG tablet TAKE ONE TABLET BY MOUTH ONCE DAILY 90 tablet 3  . omeprazole (PRILOSEC) 40 MG capsule Take 1 capsule (40 mg total) by mouth daily. 90 capsule 3  . oxyCODONE-acetaminophen (PERCOCET) 7.5-325 MG tablet Take 1 tablet by mouth every 6 (six) hours as needed for severe pain. 90 tablet 0   No facility-administered medications prior to visit.     ROS Review of Systems  Constitutional: Negative.  Negative for chills, diaphoresis and fatigue.  HENT: Negative.   Eyes: Negative for visual disturbance.  Respiratory: Negative for cough, chest tightness, shortness of breath and wheezing.   Cardiovascular: Negative for chest pain, palpitations and leg swelling.  Gastrointestinal: Negative for abdominal pain, blood in stool, constipation, diarrhea, nausea and vomiting.  Endocrine: Negative.  Negative for cold intolerance, heat intolerance, polydipsia, polyphagia and polyuria.  Genitourinary: Negative.   Musculoskeletal: Positive for back pain. Negative for joint swelling, myalgias and neck pain.  Skin: Negative.  Negative for color change and rash.  Neurological: Positive for dizziness and light-headedness. Negative for syncope, weakness and headaches.  Hematological: Negative for adenopathy. Does not bruise/bleed easily.  Psychiatric/Behavioral: Negative for behavioral problems, confusion, decreased concentration, dysphoric mood, sleep disturbance and suicidal ideas. The patient is nervous/anxious.     Objective:  BP (!) 168/82   Pulse 91   Temp 98.1 F (36.7 C) (Oral)   Resp 16   Ht 5\' 6"  (1.676 m)   Wt 268 lb (121.6 kg)   SpO2 99%   BMI 43.26 kg/m   BP Readings from Last 3 Encounters:  03/24/17 (!) 168/82  11/02/16 139/79  09/23/16 120/70    Wt  Readings from Last 3 Encounters:  03/24/17 268 lb (121.6 kg)  11/02/16 269 lb (122 kg)  09/23/16 270 lb (122.5 kg)    Physical Exam  Constitutional: She is oriented to person, place, and time. No distress.  HENT:  Mouth/Throat: Oropharynx is clear and moist. No oropharyngeal exudate.  Eyes: Conjunctivae are normal. Right eye exhibits no discharge. Left eye exhibits no discharge. No scleral icterus.  Neck: Normal range of motion. Neck supple. No JVD present. No tracheal deviation present. No thyromegaly present.  Cardiovascular: Normal rate, regular rhythm, normal heart sounds and intact distal pulses.  Exam reveals no gallop and no friction rub.   No murmur heard. Pulmonary/Chest: Effort normal and breath sounds normal. No stridor. No respiratory distress. She has no wheezes. She has no rales. She exhibits no tenderness.  Abdominal: Soft. Bowel sounds are normal. She exhibits no distension and no mass. There is no tenderness. There is no rebound and no guarding.  Musculoskeletal: Normal range of motion. She exhibits no edema, tenderness or deformity.  Lymphadenopathy:    She has no cervical adenopathy.  Neurological: She is oriented to person, place, and time.  Skin: Skin is dry. No rash noted. She is not diaphoretic. No erythema. No pallor.  Psychiatric: She has a normal mood and affect. Her behavior is normal. Judgment and thought content normal.  Vitals reviewed.   Lab Results  Component Value Date   WBC 11.3 (H) 03/24/2017   HGB 10.6 (L) 03/24/2017   HCT 32.0 (L) 03/24/2017   PLT 332.0 03/24/2017   GLUCOSE 156 (H) 03/24/2017   CHOL 245 (H) 03/24/2017   TRIG 368.0 (H) 03/24/2017   HDL 42.80 03/24/2017   LDLDIRECT 129.0 03/24/2017   LDLCALC 43 03/28/2014   ALT 30 03/24/2017   AST 31 03/24/2017   NA 139 03/24/2017   K 3.3 (L) 03/24/2017   CL 99 03/24/2017   CREATININE 1.01 03/24/2017   BUN 14 03/24/2017   CO2 27 03/24/2017   TSH 2.78 03/24/2017   INR 1.06 08/29/2015    HGBA1C 6.6 03/24/2017   MICROALBUR <0.7 03/24/2017    Dg Chest 2 View  Result Date: 04/16/2016 CLINICAL DATA:  Recent diagnosis of pneumonia. Coughing and wheezing and shortness of breath for 2 weeks. History of hypertension. EXAM: CHEST  2 VIEW COMPARISON:  08/29/2015 FINDINGS: Cardiac silhouette is top-normal in size. No mediastinal or hilar masses or evidence of adenopathy. Clear lungs.  No pleural effusion or pneumothorax. Bony thorax is intact. IMPRESSION: No active cardiopulmonary disease. Electronically Signed   By: Lajean Manes M.D.   On: 04/16/2016 15:36    Assessment & Plan:   Naylene was seen today for anemia, hypertension, hyperlipidemia, hypothyroidism and diabetes.  Diagnoses and all orders for this visit:  Other specified hypothyroidism- Her TSH is in the normal range, she will remain on the current dose of levothyroxine. -     TSH; Future  Hyperlipidemia with target LDL less than 100- he has  achieved her LDL goal is doing well on the statin. -     Lipid panel; Future  Other iron deficiency anemia- her H&H are still low but her iron level is normal, this appears to be more consistent with the anemia of chronic disease. -     CBC with Differential/Platelet; Future -     IBC panel; Future -     Ferritin; Future  Leukocytosis, unspecified type- her white cell count is lower than it was before, she is mildly anemic but her other cell lines are normal, this is a benign clinical finding. -     CBC with Differential/Platelet; Future  Type 2 diabetes mellitus with complication, without long-term current use of insulin ([place])- with an A1c is 6.6%, her blood sugars are adequately well controlled. -     Comprehensive metabolic panel; Future -     Cancel: Hemoglobin A1c; Future -     Microalbumin / creatinine urine ratio; Future -     valsartan (DIOVAN) 320 MG tablet; Take 1 tablet (320 mg total) by mouth daily. -     chlorthalidone (HYGROTON) 25 MG tablet; Take 1 tablet (25 mg  total) by mouth daily. -     POCT glycosylated hemoglobin (Hb A1C)  Essential hypertension, benign- her blood pressure is not adequately well controlled, will upgrade her to a more potent ARB and thiazide diuretic, will continue carvedilol and amlodipine. -     Comprehensive metabolic panel; Future -     Urinalysis, Routine w reflex microscopic; Future -     chlorthalidone (HYGROTON) 25 MG tablet; Take 1 tablet (25 mg total) by mouth daily. -     amLODipine (NORVASC) 10 MG tablet; Take 1 tablet (10 mg total) by mouth daily.  Depression with anxiety -     ALPRAZolam (XANAX) 0.25 MG tablet; Take 1 tablet (0.25 mg total) by mouth 2 (two) times daily as needed for anxiety.  Spinal stenosis of lumbar 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 -     oxyCODONE-acetaminophen (PERCOCET) 7.5-325 MG tablet; Take 1 tablet by mouth every 6 (six) hours as needed for severe pain.  Gastroesophageal reflux disease with esophagitis -     omeprazole (PRILOSEC) 40 MG capsule; Take 1 capsule (40 mg total) by mouth daily.   I have discontinued Ms. Soucy's losartan-hydrochlorothiazide. I am also having her start on valsartan and chlorthalidone. Additionally, I am having her maintain her latanoprost, albuterol, aspirin, Insulin Pen Needle, TOUJEO SOLOSTAR, albuterol, fluticasone furoate-vilanterol, ferrous sulfate, sertraline, gabapentin, FREESTYLE TEST STRIPS, carvedilol, JANUMET XR, ALPRAZolam, oxyCODONE-acetaminophen, omeprazole, and amLODipine.  Meds ordered this encounter  Medications  . valsartan (DIOVAN) 320 MG tablet    Sig: Take 1 tablet (320 mg total) by mouth daily.    Dispense:  90 tablet    Refill:  1  . chlorthalidone (HYGROTON) 25 MG tablet    Sig: Take 1 tablet (25 mg total) by mouth daily.    Dispense:  90 tablet    Refill:  1  . ALPRAZolam (XANAX) 0.25 MG tablet    Sig: Take 1 tablet (0.25 mg total) by mouth 2 (two) times daily as needed for anxiety.    Dispense:  75 tablet    Refill:  5  .  oxyCODONE-acetaminophen (PERCOCET) 7.5-325 MG tablet    Sig: Take 1 tablet by mouth every 6 (six) hours as needed for severe pain.    Dispense:  90 tablet    Refill:  0  . omeprazole (PRILOSEC) 40 MG capsule  Sig: Take 1 capsule (40 mg total) by mouth daily.    Dispense:  90 capsule    Refill:  3  . amLODipine (NORVASC) 10 MG tablet    Sig: Take 1 tablet (10 mg total) by mouth daily.    Dispense:  90 tablet    Refill:  1     Follow-up: Return in about 2 months (around 05/24/2017).  [name], MD

## 2017-03-24 NOTE — Progress Notes (Signed)
Pre visit review using our clinic review tool, if applicable. No additional management support is needed unless otherwise documented below in the visit note. 

## 2017-03-24 NOTE — Patient Instructions (Signed)

## 2017-03-25 ENCOUNTER — Encounter: Payer: Self-pay | Admitting: Internal Medicine

## 2017-03-26 ENCOUNTER — Telehealth: Payer: Self-pay | Admitting: Internal Medicine

## 2017-03-26 DIAGNOSIS — E118 Type 2 diabetes mellitus with unspecified complications: Secondary | ICD-10-CM

## 2017-03-26 MED ORDER — ATORVASTATIN CALCIUM 20 MG PO TABS: 20.0000 mg | ORAL_TABLET | Freq: Every day | ORAL | 3 refills | Status: DC

## 2017-03-26 NOTE — Telephone Encounter (Signed)
Please advise in PCP absence. Pt started the valsartan chlorthalidone. DC'ed the HCTZ and losartan at the 03/24/2017 office visit.

## 2017-03-26 NOTE — Telephone Encounter (Signed)
atorvastatin (LIPITOR) 20 MG tablet   Patient is requesting a refill on this medication.   Patient also states her bp has been dropping since the other bp medication was added. It was 100/48 and she felt weak and dizzy. Please advise. Thank you.

## 2017-03-26 NOTE — Telephone Encounter (Signed)
Called pt and informed of same.  

## 2017-03-26 NOTE — Telephone Encounter (Signed)
Advise to take 1/2 pill chlorthalidone daily instead of 1 pill daily. Otherwise the valsartan should be equivalent to the losartan. Can continue valsartan as ordered.

## 2017-04-28 DIAGNOSIS — G4733 Obstructive sleep apnea (adult) (pediatric): Secondary | ICD-10-CM | POA: Diagnosis not present

## 2017-05-14 ENCOUNTER — Other Ambulatory Visit: Payer: Self-pay | Admitting: Internal Medicine

## 2017-05-14 NOTE — Telephone Encounter (Signed)
Refill done.  

## 2017-05-20 ENCOUNTER — Other Ambulatory Visit: Payer: Self-pay | Admitting: Internal Medicine

## 2017-05-28 DIAGNOSIS — G4733 Obstructive sleep apnea (adult) (pediatric): Secondary | ICD-10-CM | POA: Diagnosis not present

## 2017-06-02 ENCOUNTER — Encounter: Payer: Self-pay | Admitting: Internal Medicine

## 2017-06-02 ENCOUNTER — Other Ambulatory Visit (INDEPENDENT_AMBULATORY_CARE_PROVIDER_SITE_OTHER): Payer: PPO

## 2017-06-02 ENCOUNTER — Ambulatory Visit (INDEPENDENT_AMBULATORY_CARE_PROVIDER_SITE_OTHER): Payer: PPO | Admitting: Internal Medicine

## 2017-06-02 VITALS — BP 146/80 | HR 82 | Temp 98.3°F | Resp 16 | Ht 66.0 in | Wt 267.2 lb

## 2017-06-02 DIAGNOSIS — D508 Other iron deficiency anemias: Secondary | ICD-10-CM

## 2017-06-02 DIAGNOSIS — Z23 Encounter for immunization: Secondary | ICD-10-CM | POA: Diagnosis not present

## 2017-06-02 DIAGNOSIS — E876 Hypokalemia: Secondary | ICD-10-CM

## 2017-06-02 DIAGNOSIS — I10 Essential (primary) hypertension: Secondary | ICD-10-CM

## 2017-06-02 DIAGNOSIS — M48061 Spinal stenosis, lumbar region without neurogenic claudication: Secondary | ICD-10-CM

## 2017-06-02 LAB — MAGNESIUM: Magnesium: 1.5 mg/dL (ref 1.5–2.5)

## 2017-06-02 LAB — BASIC METABOLIC PANEL
BUN: 9 mg/dL (ref 6–23)
CO2: 30 mEq/L (ref 19–32)
Calcium: 9.5 mg/dL (ref 8.4–10.5)
Chloride: 100 mEq/L (ref 96–112)
Creatinine, Ser: 0.76 mg/dL (ref 0.40–1.20)
GFR: 96.97 mL/min (ref >60.00)
Glucose, Bld: 105 mg/dL — ABNORMAL HIGH (ref 70–99)
Potassium: 3.2 mEq/L — ABNORMAL LOW (ref 3.5–5.1)
Sodium: 139 mEq/L (ref 135–145)

## 2017-06-02 LAB — CBC WITH DIFFERENTIAL/PLATELET
Basophils Absolute: 0.1 10*3/uL (ref 0.0–0.1)
Basophils Relative: 0.5 % (ref 0.0–3.0)
Eosinophils Absolute: 0.2 10*3/uL (ref 0.0–0.7)
Eosinophils Relative: 2.2 % (ref 0.0–5.0)
HCT: 33.1 % — ABNORMAL LOW (ref 36.0–46.0)
Hemoglobin: 10.9 g/dL — ABNORMAL LOW (ref 12.0–15.0)
Lymphocytes Relative: 33.5 % (ref 12.0–46.0)
Lymphs Abs: 3.6 10*3/uL (ref 0.7–4.0)
MCHC: 33 g/dL (ref 30.0–36.0)
MCV: 86.8 fl (ref 78.0–100.0)
Monocytes Absolute: 1 10*3/uL (ref 0.1–1.0)
Monocytes Relative: 9.3 % (ref 3.0–12.0)
Neutro Abs: 5.8 10*3/uL (ref 1.4–7.7)
Neutrophils Relative %: 54.5 % (ref 43.0–77.0)
Platelets: 348 10*3/uL (ref 150.0–400.0)
RBC: 3.82 Mil/uL — ABNORMAL LOW (ref 3.87–5.11)
RDW: 15.6 % — ABNORMAL HIGH (ref 11.5–15.5)
WBC: 10.7 10*3/uL — ABNORMAL HIGH (ref 4.0–10.5)

## 2017-06-02 MED ORDER — OXYCODONE-ACETAMINOPHEN 7.5-325 MG PO TABS: 1.0000 | ORAL_TABLET | Freq: Four times a day (QID) | ORAL | 0 refills | Status: DC | PRN

## 2017-06-02 MED ORDER — POTASSIUM CHLORIDE CRYS ER 20 MEQ PO TBCR: 20.0000 meq | EXTENDED_RELEASE_TABLET | Freq: Two times a day (BID) | ORAL | 1 refills | Status: DC

## 2017-06-02 NOTE — Progress Notes (Signed)
Subjective:  Patient ID: Valerie [name], female    DOB: 1946/06/06  Age: 71 y.o. MRN: [id number]  CC: Anemia; Hypertension; Hypothyroidism; and Diabetes   HPI Valerie [name] presents for f/up - She has her usual baseline symptoms of chronic chest wall pain, fatigue and weight gain. She continues to have intermittent episodes of rectal bleeding and tells me she has an appointment with her gastroenterologist within the next few weeks. She otherwise offers no new or different complaints today.  Outpatient Medications Prior to Visit  Medication Sig Dispense Refill  . albuterol (PROVENTIL) (2.5 MG/3ML) 0.083% nebulizer solution     . albuterol (VENTOLIN HFA) 108 (90 BASE) MCG/ACT inhaler Inhale 2 puffs into the lungs every 6 (six) hours as needed. For shortness of breath 3 Inhaler 4  . ALPRAZolam (XANAX) 0.25 MG tablet Take 1 tablet (0.25 mg total) by mouth 2 (two) times daily as needed for anxiety. 75 tablet 5  . amLODipine (NORVASC) 10 MG tablet TAKE ONE TABLET BY MOUTH ONCE DAILY 90 tablet 0  . aspirin 81 MG tablet Take 81 mg by mouth daily.    [name] atorvastatin (LIPITOR) 20 MG tablet Take 1 tablet (20 mg total) by mouth daily. 90 tablet 3  . carvedilol (COREG) 25 MG tablet TAKE ONE TABLET BY MOUTH TWICE DAILY WITH MEALS 180 tablet 1  . chlorthalidone (HYGROTON) 25 MG tablet Take 1 tablet (25 mg total) by mouth daily. 90 tablet 1  . ferrous sulfate 325 (65 FE) MG tablet Take 1 tablet (325 mg total) by mouth 2 (two) times daily with a meal. 60 tablet 11  . fluticasone furoate-vilanterol (BREO ELLIPTA) 200-25 MCG/INH AEPB Inhale 1 puff into the lungs daily. 30 each 11  . FREESTYLE TEST STRIPS test strip USE TO TEST BLOOD SUGAR UP TO THREE TIMES DAILY 100 each 11  . gabapentin (NEURONTIN) 300 MG capsule TAKE ONE CAPSULE BY MOUTH THREE TIMES DAILY 90 capsule 1  . Insulin Pen Needle 31G X 5 MM MISC Use once daily with insulin 100 each 3  . JANUMET XR [phone] MG TB24 TAKE ONE TABLET BY MOUTH ONCE  DAILY 30 tablet 5  . latanoprost (XALATAN) 0.005 % ophthalmic solution Place 1 drop into both eyes at bedtime.    [name] omeprazole (PRILOSEC) 40 MG capsule Take 1 capsule (40 mg total) by mouth daily. 90 capsule 3  . sertraline (ZOLOFT) 100 MG tablet Take 1 tablet (100 mg total) by mouth daily. 90 tablet 3  . TOUJEO SOLOSTAR 300 UNIT/ML SOPN INJECT 30 UNITS SUBCUTANEOUSLY ONCE DAILY 6 pen 11  . valsartan (DIOVAN) 320 MG tablet Take 1 tablet (320 mg total) by mouth daily. 90 tablet 1  . amLODipine (NORVASC) 10 MG tablet Take 1 tablet (10 mg total) by mouth daily. 90 tablet 1  . oxyCODONE-acetaminophen (PERCOCET) 7.5-325 MG tablet Take 1 tablet by mouth every 6 (six) hours as needed for severe pain. 90 tablet 0   No facility-administered medications prior to visit.     ROS Review of Systems  Constitutional: Positive for fatigue and unexpected weight change. Negative for appetite change, chills and diaphoresis.  HENT: Negative.  Negative for trouble swallowing.   Endocrine: Negative.   Genitourinary: Negative.  Negative for difficulty urinating, dysuria, frequency and hematuria.  Musculoskeletal: Positive for back pain. Negative for arthralgias, joint swelling, myalgias and neck pain.  Neurological: Negative.  Negative for dizziness, weakness and headaches.  Hematological: Negative for adenopathy. Does not bruise/bleed easily.  Psychiatric/Behavioral: Negative.  Objective:  BP (!) 146/80 (BP Location: Left Arm, Patient Position: Sitting, Cuff Size: Normal)   Pulse 82   Temp 98.3 F (36.8 C) (Oral)   Resp 16   Ht 5' 6"$  (1.676 m)   Wt 267 lb 4 oz (121.2 kg)   SpO2 99%   BMI 43.14 kg/m   BP Readings from Last 3 Encounters:  06/02/17 (!) 146/80  03/24/17 (!) 168/82  11/02/16 139/79    Wt Readings from Last 3 Encounters:  06/02/17 267 lb 4 oz (121.2 kg)  03/24/17 268 lb (121.6 kg)  11/02/16 269 lb (122 kg)    Physical Exam  Constitutional: She is oriented to person, place, and  time. No distress.  HENT:  Nose: Nose normal.  Mouth/Throat: Oropharynx is clear and moist. No oropharyngeal exudate.  Eyes: Conjunctivae are normal. Right eye exhibits no discharge. Left eye exhibits no discharge. No scleral icterus.  Neck: Normal range of motion. Neck supple. No JVD present. No thyromegaly present.  Cardiovascular: Normal rate, regular rhythm and normal heart sounds.  Exam reveals no gallop.   No murmur heard. Pulmonary/Chest: Effort normal and breath sounds normal. No respiratory distress. She has no wheezes. She has no rales. She exhibits no tenderness.  Abdominal: Soft. Bowel sounds are normal. She exhibits no distension and no mass. There is no tenderness. There is no rebound and no guarding.  Musculoskeletal: Normal range of motion. She exhibits no edema or tenderness.  Lymphadenopathy:    She has no cervical adenopathy.  Neurological: She is alert and oriented to person, place, and time.  Skin: Skin is warm and dry. No rash noted. She is not diaphoretic. No erythema. No pallor.  Vitals reviewed.   Lab Results  Component Value Date   WBC 10.7 (H) 06/02/2017   HGB 10.9 (L) 06/02/2017   HCT 33.1 (L) 06/02/2017   PLT 348.0 06/02/2017   GLUCOSE 105 (H) 06/02/2017   CHOL 245 (H) 03/24/2017   TRIG 368.0 (H) 03/24/2017   HDL 42.80 03/24/2017   LDLDIRECT 129.0 03/24/2017   LDLCALC 43 03/28/2014   ALT 30 03/24/2017   AST 31 03/24/2017   NA 139 06/02/2017   K 3.2 (L) 06/02/2017   CL 100 06/02/2017   CREATININE 0.76 06/02/2017   BUN 9 06/02/2017   CO2 30 06/02/2017   TSH 2.78 03/24/2017   INR 1.06 08/29/2015   HGBA1C 6.6 03/24/2017   MICROALBUR <0.7 03/24/2017    Dg Chest 2 View  Result Date: 04/16/2016 CLINICAL DATA:  Recent diagnosis of pneumonia. Coughing and wheezing and shortness of breath for 2 weeks. History of hypertension. EXAM: CHEST  2 VIEW COMPARISON:  08/29/2015 FINDINGS: Cardiac silhouette is top-normal in size. No mediastinal or hilar masses or  evidence of adenopathy. Clear lungs.  No pleural effusion or pneumothorax. Bony thorax is intact. IMPRESSION: No active cardiopulmonary disease. Electronically Signed   By: Lajean Manes M.D.   On: 04/16/2016 15:36    Assessment & Plan:   Kitzie was seen today for anemia, hypertension, hypothyroidism and diabetes.  Diagnoses and all orders for this visit:  Other iron deficiency anemia- her H&H have improved slightly, I have encouraged her to proceed with a GI evaluation. -     CBC with Differential/Platelet; Future  Hypokalemia- this is caused by the thiazide diuretic, I've asked her to start potassium replacement therapy. -     Basic metabolic panel; Future -     Magnesium; Future -     potassium chloride SA (  K-DUR,KLOR-CON) 20 MEQ tablet; Take 1 tablet (20 mEq total) by mouth 2 (two) times daily.  Essential hypertension, benign- her blood pressure is adequately well controlled, her renal function is normal, will treat the hypokalemia. -     Basic metabolic panel; Future -     Magnesium; Future -     potassium chloride SA (K-DUR,KLOR-CON) 20 MEQ tablet; Take 1 tablet (20 mEq total) by mouth 2 (two) times daily.  Need for Tdap vaccination -     Tdap vaccine greater than or equal to 7yo IM  Spinal stenosis of lumbar 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 -     oxyCODONE-acetaminophen (PERCOCET) 7.5-325 MG tablet; Take 1 tablet by mouth every 6 (six) hours as needed for severe pain.   I am having Ms. Rust start on potassium chloride SA. I am also having her maintain her latanoprost, albuterol, aspirin, Insulin Pen Needle, TOUJEO SOLOSTAR, albuterol, fluticasone furoate-vilanterol, ferrous sulfate, sertraline, FREESTYLE TEST STRIPS, JANUMET XR, valsartan, chlorthalidone, ALPRAZolam, omeprazole, atorvastatin, amLODipine, gabapentin, carvedilol, and oxyCODONE-acetaminophen.  Meds ordered this encounter  Medications  . oxyCODONE-acetaminophen (PERCOCET) 7.5-325 MG tablet    Sig: Take 1 tablet by  mouth every 6 (six) hours as needed for severe pain.    Dispense:  90 tablet    Refill:  0  . potassium chloride SA (K-DUR,KLOR-CON) 20 MEQ tablet    Sig: Take 1 tablet (20 mEq total) by mouth 2 (two) times daily.    Dispense:  180 tablet    Refill:  1     Follow-up: Return in about 4 months (around 10/02/2017).  [name], MD

## 2017-06-02 NOTE — Patient Instructions (Signed)
Iron Deficiency Anemia, Adult Iron deficiency anemia is a condition in which the concentration of red blood cells or hemoglobin in the blood is below normal because of too little iron. Hemoglobin is a substance in red blood cells that carries oxygen to the body's tissues. When the concentration of red blood cells or hemoglobin is too low, not enough oxygen reaches these tissues. Iron deficiency anemia is usually long-lasting (chronic) and it develops over time. It may or may not cause symptoms. It is a common type of anemia. What are the causes? This condition may be caused by:  Not enough iron in the diet.  Blood loss caused by bleeding in the intestine.  Blood loss from a gastrointestinal condition like Crohn disease.  Frequent blood draws, such as from blood donation.  Abnormal absorption in the gut.  Heavy menstrual periods in women.  Cancers of the gastrointestinal system, such as colon cancer.  What are the signs or symptoms? Symptoms of this condition may include:  Fatigue.  Headache.  Pale skin, lips, and nail beds.  Poor appetite.  Weakness.  Shortness of breath.  Dizziness.  Cold hands and feet.  Fast or irregular heartbeat.  Irritability. This is more common in severe anemia.  Rapid breathing. This is more common in severe anemia.  Mild anemia may not cause any symptoms. How is this diagnosed? This condition is diagnosed based on:  Your medical history.  A physical exam.  Blood tests.  You may have additional tests to find the underlying cause of your anemia, such as:  Testing for blood in the stool (fecal occult blood test).  A procedure to see inside your colon and rectum (colonoscopy).  A procedure to see inside your esophagus and stomach (endoscopy).  A test in which cells are removed from bone marrow (bone marrow aspiration) or fluid is removed from the bone marrow to be examined (biopsy). This is rarely needed.  How is this  treated? This condition is treated by correcting the cause of your iron deficiency. Treatment may involve:  Adding iron-rich foods to your diet.  Taking iron supplements. If you are pregnant or breastfeeding, you may need to take extra iron because your normal diet usually does not provide the amount of iron that you need.  Increasing vitamin C intake. Vitamin C helps your body absorb iron. Your health care provider may recommend that you take iron supplements along with a glass of orange juice or a vitamin C supplement.  Medicines to make heavy menstrual flow lighter.  Surgery.  You may need repeat blood tests to determine whether treatment is working. Depending on the underlying cause, the anemia should be corrected within 2 months of starting treatment. If the treatment does not seem to be working, you may need more testing. Follow these instructions at home: Medicines  Take over-the-counter and prescription medicines only as told by your health care provider. This includes iron supplements and vitamins.  If you cannot tolerate taking iron supplements by mouth, talk with your health care provider about taking them through a vein (intravenously) or an injection into a muscle.  For the best iron absorption, you should take iron supplements when your stomach is empty. If you cannot tolerate them on an empty stomach, you may need to take them with food.  Do not drink milk or take antacids at the same time as your iron supplements. Milk and antacids may interfere with iron absorption.  Iron supplements can cause constipation. To prevent constipation, include fiber   in your diet as told by your health care provider. A stool softener may also be recommended. Eating and drinking  Talk with your health care provider before changing your diet. He or she may recommend that you eat foods that contain a lot of iron, such as: ? Liver. ? Low-fat (lean) beef. ? Breads and cereals that have iron  added to them (are fortified). ? Eggs. ? Dried fruit. ? Dark [place], leafy vegetables.  To help your body use the iron from iron-rich foods, eat those foods at the same time as fresh fruits and vegetables that are high in vitamin C. Foods that are high in vitamin C include: ? Oranges. ? Peppers. ? Tomatoes. ? Mangoes.  Drinkenoughfluid to keep your urine clear or pale yellow. General instructions  Return to your normal activities as told by your health care provider. Ask your health care provider what activities are safe for you.  Practice good hygiene. Anemia can make you more prone to illness and infection.  Keep all follow-up visits as told by your health care provider. This is important. Contact a health care provider if:  You feel nauseous or you vomit.  You feel weak.  You have unexplained sweating.  You develop symptoms of constipation, such as: ? Having fewer than three bowel movements a week. ? Straining to have a bowel movement. ? Having stools that are hard, dry, or larger than normal. ? Feeling full or bloated. ? Pain in the lower abdomen. ? Not feeling relief after having a bowel movement. Get help right away if:  You faint. If this happens, do not drive yourself to the hospital. Call your local emergency services (911 in the [place]  You have chest pain.  You have shortness of breath that: ? Is severe. ? Gets worse with physical activity.  You have a rapid heartbeat.  You become light-headed when getting up from a sitting or lying down position. This information is not intended to replace advice given to you by your health care provider. Make sure you discuss any questions you have with your health care provider. Document Released: 11/27/2000 Document Revised: 08/19/2016 Document Reviewed: 08/19/2016 Elsevier Interactive Patient Education  2018 Elsevier Inc.  

## 2017-06-03 ENCOUNTER — Encounter: Payer: Self-pay | Admitting: Internal Medicine

## 2017-06-11 DIAGNOSIS — R1012 Left upper quadrant pain: Secondary | ICD-10-CM | POA: Diagnosis not present

## 2017-06-11 DIAGNOSIS — K921 Melena: Secondary | ICD-10-CM | POA: Diagnosis not present

## 2017-06-11 DIAGNOSIS — Z800 Family history of malignant neoplasm of digestive organs: Secondary | ICD-10-CM | POA: Diagnosis not present

## 2017-06-11 DIAGNOSIS — K219 Gastro-esophageal reflux disease without esophagitis: Secondary | ICD-10-CM | POA: Diagnosis not present

## 2017-06-20 ENCOUNTER — Other Ambulatory Visit: Payer: Self-pay | Admitting: Internal Medicine

## 2017-06-20 DIAGNOSIS — E118 Type 2 diabetes mellitus with unspecified complications: Secondary | ICD-10-CM

## 2017-06-20 DIAGNOSIS — Z794 Long term (current) use of insulin: Secondary | ICD-10-CM

## 2017-07-12 ENCOUNTER — Ambulatory Visit: Payer: PPO

## 2017-07-12 NOTE — Progress Notes (Deleted)
Subjective:   Valerie [name] is a 71 y.o. female who presents for Medicare Annual (Subsequent) preventive examination.  Review of Systems:  No ROS.  Medicare Wellness Visit. Additional risk factors are reflected in the social history.    Sleep patterns: {SX; SLEEP PATTERNS:18802::"feels rested on waking","does not get up to void","gets up *** times nightly to void","sleeps *** hours nightly"}.    Home Safety/Smoke Alarms: Feels safe in home. Smoke alarms in place.  Living environment; residence and Firearm Safety: {Rehab home environment / accessibility:30080::"no firearms","firearms stored safely"}. Seat Belt Safety/Bike Helmet: Wears seat belt.   Counseling:   Eye Exam-  Dental-  Female:   Pap- N/A     Mammo- Last 05/27/16,        Dexa scan- Last 04/16/14,     CCS- Last 02/23/07, recall 5 years     Objective:     Vitals: There were no vitals taken for this visit.  There is no height or weight on file to calculate BMI.   Tobacco History  Smoking Status  . Never Smoker  Smokeless Tobacco  . Never Used     Counseling given: Not Answered   Past Medical History:  Diagnosis Date  . Anemia   . Anxiety and depression   . Asthma   . Degenerative arthritis   . Depression   . Diabetes mellitus, type 2 ([place])   . Fatigue   . GERD (gastroesophageal reflux disease)   . Glaucoma   . Hemorrhoids   . Hyperlipidemia   . Hypertension   . Hypothyroid   . Hypoxemia 11/24/2013  . Memory deficit 10/04/2013  . Obesity   . Sleep apnea   . Snoring disorder    Past Surgical History:  Procedure Laterality Date  . benign tumors resected    . CATARACT EXTRACTION Left   . FOOT SURGERY Left    bone spur  . REFRACTIVE SURGERY     Glaucoma  . ROTATOR CUFF REPAIR    . TUBAL LIGATION     Family History  Problem Relation Age of Onset  . Hypertension Unknown        family history  . Alcohol abuse Unknown   . Alcohol abuse Mother   . Heart attack Mother   . Coronary artery  disease Brother   . Heart attack Father   . Heart disease Sister   . Atrial fibrillation Sister   . Hypertension Sister    History  Sexual Activity  . Sexual activity: Not Currently    Comment: not working, lives with husband, 3 sons    Outpatient Encounter Prescriptions as of 07/12/2017  Medication Sig  . albuterol (PROVENTIL) (2.5 MG/3ML) 0.083% nebulizer solution   . albuterol (VENTOLIN HFA) 108 (90 BASE) MCG/ACT inhaler Inhale 2 puffs into the lungs every 6 (six) hours as needed. For shortness of breath  . ALPRAZolam (XANAX) 0.25 MG tablet Take 1 tablet (0.25 mg total) by mouth 2 (two) times daily as needed for anxiety.  [name] amLODipine (NORVASC) 10 MG tablet TAKE 1 TABLET BY MOUTH ONCE DAILY  . aspirin 81 MG tablet Take 81 mg by mouth daily.  [name] atorvastatin (LIPITOR) 20 MG tablet Take 1 tablet (20 mg total) by mouth daily.  . carvedilol (COREG) 25 MG tablet TAKE ONE TABLET BY MOUTH TWICE DAILY WITH MEALS  . chlorthalidone (HYGROTON) 25 MG tablet Take 1 tablet (25 mg total) by mouth daily.  . ferrous sulfate 325 (65 FE) MG tablet Take 1 tablet (325  mg total) by mouth 2 (two) times daily with a meal.  . fluticasone furoate-vilanterol (BREO ELLIPTA) 200-25 MCG/INH AEPB Inhale 1 puff into the lungs daily.  [name] FREESTYLE TEST STRIPS test strip USE TO TEST BLOOD SUGAR UP TO THREE TIMES DAILY  . gabapentin (NEURONTIN) 300 MG capsule TAKE ONE CAPSULE BY MOUTH THREE TIMES DAILY  . Insulin Pen Needle 31G X 5 MM MISC Use once daily with insulin  . JANUMET XR [phone] MG TB24 TAKE ONE TABLET BY MOUTH ONCE DAILY  . latanoprost (XALATAN) 0.005 % ophthalmic solution Place 1 drop into both eyes at bedtime.  [name] omeprazole (PRILOSEC) 40 MG capsule Take 1 capsule (40 mg total) by mouth daily.  [name] oxyCODONE-acetaminophen (PERCOCET) 7.5-325 MG tablet Take 1 tablet by mouth every 6 (six) hours as needed for severe pain.  . potassium chloride SA (K-DUR,KLOR-CON) 20 MEQ tablet Take 1 tablet (20 mEq total) by  mouth 2 (two) times daily.  . sertraline (ZOLOFT) 100 MG tablet Take 1 tablet (100 mg total) by mouth daily.  . TOUJEO SOLOSTAR 300 UNIT/ML SOPN INJECT 30 UNITS SUBCUTANEOUSLY ONCE DAILY  . valsartan (DIOVAN) 320 MG tablet Take 1 tablet (320 mg total) by mouth daily.   No facility-administered encounter medications on file as of 07/12/2017.     Activities of Daily Living No flowsheet data found.  Patient Care Team: Janith Lima, MD as PCP - General    Assessment:    Physical assessment deferred to PCP.  Exercise Activities and Dietary recommendations   Diet (meal preparation, eat out, water intake, caffeinated beverages, dairy products, fruits and vegetables): {Desc; diets:16563} Breakfast: Lunch:  Dinner:      Goals    . Attending meditation / other stress management classes          [facility]; will try the pool  Active Prayer life; States she can work on this/ Will go to prayer service at [employer] to sister Would like to get back into walking/ may try pool   Apt with Dr. Ronnald Ramp to discuss more affordable meds for depression if she continues to feel bad or mood gets more depressed Is functioning at present  Wellness nurse to put together some resources for disabled son    . Exercise 150 minutes per week (moderate activity)          Will check [employer] and start back exercising if she can;      . reducing stress           1. Tell her sister now 2. Tell grand dtr no as well 3. Will work out time for [employer]!!      Fall Risk Fall Risk  04/29/2016 04/29/2015 02/06/2015 01/30/2015 09/01/2013  Falls in the past year? No No No No No   Depression Screen PHQ 2/9 Scores 06/02/2017 04/29/2016 04/29/2015 02/06/2015  PHQ - 2 Score - 2 3 0  PHQ- 9 Score - - 9 -  Exception Documentation Patient refusal - - -     Cognitive Function MMSE - Mini Mental State Exam 04/29/2016 04/29/2015 04/29/2015  Not completed: (No Data) (No Data) Unable to complete         Immunization History  Administered Date(s) Administered  . Influenza Split 09/21/2011, 08/19/2012  . Influenza Whole 09/25/2009, 09/04/2010  . Influenza, High Dose Seasonal PF 09/23/2016  . Influenza,inj,Quad PF,36+ Mos 09/01/2013, 11/29/2014, 08/30/2015  . Pneumococcal Conjugate-13 07/13/2014  . Pneumococcal Polysaccharide-23 01/20/2013  . Td  02/05/2010  . Tdap 06/02/2017   Screening Tests Health Maintenance  Topic Date Due  . COLONOSCOPY  02/22/2017  . INFLUENZA VACCINE  07/14/2017  . OPHTHALMOLOGY EXAM  09/17/2017  . HEMOGLOBIN A1C  09/23/2017  . PNA vac Low Risk Adult (2 of 2 - PPSV23) 01/20/2018  . FOOT EXAM  03/24/2018  . MAMMOGRAM  05/27/2018  . TETANUS/TDAP  06/03/2027  . DEXA SCAN  Completed  . Hepatitis C Screening  Completed      Plan:    I have personally reviewed and noted the following in the patient's chart:   . Medical and social history . Use of alcohol, tobacco or illicit drugs  . Current medications and supplements . Functional ability and status . Nutritional status . Physical activity . Advanced directives . List of other physicians . Vitals . Screenings to include cognitive, depression, and falls . Referrals and appointments  In addition, I have reviewed and discussed with patient certain preventive protocols, quality metrics, and best practice recommendations. A written personalized care plan for preventive services as well as general preventive health recommendations were provided to patient.     Michiel Cowboy, RN  07/12/2017

## 2017-07-12 NOTE — Progress Notes (Deleted)
Pre visit review using our clinic review tool, if applicable. No additional management support is needed unless otherwise documented below in the visit note. 

## 2017-07-26 DIAGNOSIS — Z1231 Encounter for screening mammogram for malignant neoplasm of breast: Secondary | ICD-10-CM | POA: Diagnosis not present

## 2017-07-26 LAB — HM MAMMOGRAPHY

## 2017-07-28 ENCOUNTER — Telehealth: Payer: Self-pay | Admitting: Internal Medicine

## 2017-07-28 NOTE — Telephone Encounter (Signed)
Pt called requesting an appointment with Dr Ronnald Ramp to discuss some of her medications on Monday. She said that the Janumet XR [phone] mg is very expensive. She wanted to know if we have any samples of this that should could pick up?

## 2017-07-29 ENCOUNTER — Encounter: Payer: Self-pay | Admitting: Internal Medicine

## 2017-07-29 NOTE — Telephone Encounter (Signed)
I do have samples of this. Will you let her know?

## 2017-07-30 NOTE — Telephone Encounter (Signed)
LM informing pt.

## 2017-07-30 NOTE — Telephone Encounter (Signed)
Pt will be coming today to pick up samples

## 2017-08-02 ENCOUNTER — Ambulatory Visit (INDEPENDENT_AMBULATORY_CARE_PROVIDER_SITE_OTHER): Payer: PPO | Admitting: Internal Medicine

## 2017-08-02 ENCOUNTER — Encounter: Payer: Self-pay | Admitting: Internal Medicine

## 2017-08-02 VITALS — BP 124/80 | HR 84 | Temp 98.4°F | Resp 16 | Ht 66.0 in | Wt 268.0 lb

## 2017-08-02 DIAGNOSIS — E118 Type 2 diabetes mellitus with unspecified complications: Secondary | ICD-10-CM

## 2017-08-02 LAB — POCT GLUCOSE (DEVICE FOR HOME USE): Glucose Fasting, POC: 166 mg/dL — AB (ref 70–99)

## 2017-08-02 LAB — POCT GLYCOSYLATED HEMOGLOBIN (HGB A1C): Hemoglobin A1C: 7.6

## 2017-08-02 NOTE — Progress Notes (Signed)
Subjective:  Patient ID: Valerie [name], female    DOB: [birth year]  Age: 71 y.o. MRN: [id number]  CC: Diabetes   HPI Valerie [name] presents for f/up - She is in the "doughnut hole", and has not been able to afford Janumet or Toujeo recently. Subsequently, she's become concerned that her blood sugars are not well-controlled.  Outpatient Medications Prior to Visit  Medication Sig Dispense Refill  . albuterol (PROVENTIL) (2.5 MG/3ML) 0.083% nebulizer solution     . albuterol (VENTOLIN HFA) 108 (90 BASE) MCG/ACT inhaler Inhale 2 puffs into the lungs every 6 (six) hours as needed. For shortness of breath 3 Inhaler 4  . ALPRAZolam (XANAX) 0.25 MG tablet Take 1 tablet (0.25 mg total) by mouth 2 (two) times daily as needed for anxiety. 75 tablet 5  . amLODipine (NORVASC) 10 MG tablet TAKE 1 TABLET BY MOUTH ONCE DAILY 90 tablet 1  . aspirin 81 MG tablet Take 81 mg by mouth daily.    [name] atorvastatin (LIPITOR) 20 MG tablet Take 1 tablet (20 mg total) by mouth daily. 90 tablet 3  . carvedilol (COREG) 25 MG tablet TAKE ONE TABLET BY MOUTH TWICE DAILY WITH MEALS 180 tablet 1  . chlorthalidone (HYGROTON) 25 MG tablet Take 1 tablet (25 mg total) by mouth daily. 90 tablet 1  . ferrous sulfate 325 (65 FE) MG tablet Take 1 tablet (325 mg total) by mouth 2 (two) times daily with a meal. 60 tablet 11  . fluticasone furoate-vilanterol (BREO ELLIPTA) 200-25 MCG/INH AEPB Inhale 1 puff into the lungs daily. 30 each 11  . FREESTYLE TEST STRIPS test strip USE TO TEST BLOOD SUGAR UP TO THREE TIMES DAILY 100 each 11  . gabapentin (NEURONTIN) 300 MG capsule TAKE ONE CAPSULE BY MOUTH THREE TIMES DAILY 90 capsule 1  . Insulin Pen Needle 31G X 5 MM MISC Use once daily with insulin 100 each 3  . JANUMET XR [phone] MG TB24 TAKE ONE TABLET BY MOUTH ONCE DAILY 90 tablet 1  . latanoprost (XALATAN) 0.005 % ophthalmic solution Place 1 drop into both eyes at bedtime.    [name] omeprazole (PRILOSEC) 40 MG capsule Take 1 capsule (40  mg total) by mouth daily. 90 capsule 3  . oxyCODONE-acetaminophen (PERCOCET) 7.5-325 MG tablet Take 1 tablet by mouth every 6 (six) hours as needed for severe pain. 90 tablet 0  . potassium chloride SA (K-DUR,KLOR-CON) 20 MEQ tablet Take 1 tablet (20 mEq total) by mouth 2 (two) times daily. 180 tablet 1  . sertraline (ZOLOFT) 100 MG tablet Take 1 tablet (100 mg total) by mouth daily. 90 tablet 3  . TOUJEO SOLOSTAR 300 UNIT/ML SOPN INJECT 30 UNITS SUBCUTANEOUSLY ONCE DAILY 6 pen 11  . valsartan (DIOVAN) 320 MG tablet Take 1 tablet (320 mg total) by mouth daily. 90 tablet 1   No facility-administered medications prior to visit.     ROS Review of Systems  Constitutional: Negative.  Negative for appetite change, diaphoresis, fatigue and unexpected weight change.  HENT: Negative.   Eyes: Negative for visual disturbance.  Respiratory: Negative for cough, chest tightness, shortness of breath and wheezing.   Cardiovascular: Negative for chest pain, palpitations and leg swelling.  Gastrointestinal: Negative for abdominal pain, constipation, diarrhea, nausea and vomiting.  Endocrine: Negative for polydipsia, polyphagia and polyuria.  Genitourinary: Negative.  Negative for difficulty urinating, dysuria and frequency.  Musculoskeletal: Negative.  Negative for back pain and myalgias.  Skin: Negative.  Negative for color change.  Allergic/Immunologic: Negative.  Neurological: Negative.   Hematological: Negative.  Negative for adenopathy. Does not bruise/bleed easily.  Psychiatric/Behavioral: Negative.     Objective:  BP 124/80 (BP Location: Left Arm, Patient Position: Sitting, Cuff Size: Large)   Pulse 84   Temp 98.4 F (36.9 C) (Oral)   Resp 16   Ht 5\' 6"  (1.676 m)   Wt 268 lb (121.6 kg)   SpO2 98%   BMI 43.26 kg/m   BP Readings from Last 3 Encounters:  08/02/17 124/80  06/02/17 (!) 146/80  03/24/17 (!) 168/82    Wt Readings from Last 3 Encounters:  08/02/17 268 lb (121.6 kg)    06/02/17 267 lb 4 oz (121.2 kg)  03/24/17 268 lb (121.6 kg)    Physical Exam  Constitutional: She is oriented to person, place, and time. No distress.  HENT:  Mouth/Throat: Oropharynx is clear and moist. No oropharyngeal exudate.  Eyes: Conjunctivae are normal. Right eye exhibits no discharge. Left eye exhibits no discharge. No scleral icterus.  Neck: Normal range of motion. Neck supple. No JVD present. No thyromegaly present.  Cardiovascular: Normal rate, regular rhythm and intact distal pulses.  Exam reveals no gallop and no friction rub.   No murmur heard. Pulmonary/Chest: Effort normal and breath sounds normal. No respiratory distress. She has no wheezes. She has no rales. She exhibits no tenderness.  Abdominal: Soft. Bowel sounds are normal. She exhibits no distension and no mass. There is no tenderness. There is no rebound and no guarding.  Musculoskeletal: Normal range of motion. She exhibits no edema, tenderness or deformity.  Lymphadenopathy:    She has no cervical adenopathy.  Neurological: She is alert and oriented to person, place, and time.  Skin: Skin is warm and dry. No rash noted. She is not diaphoretic. No erythema. No pallor.  Vitals reviewed.   Lab Results  Component Value Date   WBC 10.7 (H) 06/02/2017   HGB 10.9 (L) 06/02/2017   HCT 33.1 (L) 06/02/2017   PLT 348.0 06/02/2017   GLUCOSE 105 (H) 06/02/2017   CHOL 245 (H) 03/24/2017   TRIG 368.0 (H) 03/24/2017   HDL 42.80 03/24/2017   LDLDIRECT 129.0 03/24/2017   LDLCALC 43 03/28/2014   ALT 30 03/24/2017   AST 31 03/24/2017   NA 139 06/02/2017   K 3.2 (L) 06/02/2017   CL 100 06/02/2017   CREATININE 0.76 06/02/2017   BUN 9 06/02/2017   CO2 30 06/02/2017   TSH 2.78 03/24/2017   INR 1.06 08/29/2015   HGBA1C 7.6 08/02/2017   MICROALBUR <0.7 03/24/2017    Dg Chest 2 View  Result Date: 04/16/2016 CLINICAL DATA:  Recent diagnosis of pneumonia. Coughing and wheezing and shortness of breath for 2 weeks.  History of hypertension. EXAM: CHEST  2 VIEW COMPARISON:  08/29/2015 FINDINGS: Cardiac silhouette is top-normal in size. No mediastinal or hilar masses or evidence of adenopathy. Clear lungs.  No pleural effusion or pneumothorax. Bony thorax is intact. IMPRESSION: No active cardiopulmonary disease. Electronically Signed   By: Lajean Manes M.D.   On: 04/16/2016 15:36    Assessment & Plan:   Kensi was seen today for diabetes.  Diagnoses and all orders for this visit:  Type 2 diabetes mellitus with complication, without long-term current use of insulin ([place])- her blood sugars have been well controlled but now her A1c is up to 7.6% due to noncompliance. I gave her samples of both Janumet and Toujeo. She will continue to work on her lifestyle modifications as well. -  POCT glycosylated hemoglobin (Hb A1C) -     POCT Glucose (Device for Home Use)   I am having Ms. Fessel maintain her latanoprost, albuterol, aspirin, Insulin Pen Needle, TOUJEO SOLOSTAR, albuterol, fluticasone furoate-vilanterol, ferrous sulfate, sertraline, FREESTYLE TEST STRIPS, valsartan, chlorthalidone, ALPRAZolam, omeprazole, atorvastatin, gabapentin, carvedilol, oxyCODONE-acetaminophen, potassium chloride SA, JANUMET XR, and amLODipine.  No orders of the defined types were placed in this encounter.    Follow-up: Return in about 3 months (around 11/02/2017).  [name], MD

## 2017-08-02 NOTE — Patient Instructions (Signed)

## 2017-08-11 ENCOUNTER — Other Ambulatory Visit: Payer: Self-pay | Admitting: Internal Medicine

## 2017-08-11 DIAGNOSIS — F418 Other specified anxiety disorders: Secondary | ICD-10-CM

## 2017-08-11 NOTE — Telephone Encounter (Signed)
Faxed script back to pharmacy.../lmb 

## 2017-08-18 ENCOUNTER — Telehealth: Payer: Self-pay | Admitting: Internal Medicine

## 2017-08-18 NOTE — Telephone Encounter (Addendum)
Pt called regarding her valsartan (DIOVAN) 320 MG tablet  She would like something else called in since it was recalled.  Please advise  [place] ([place]), [place]

## 2017-08-19 ENCOUNTER — Other Ambulatory Visit: Payer: Self-pay | Admitting: Internal Medicine

## 2017-08-19 DIAGNOSIS — E118 Type 2 diabetes mellitus with unspecified complications: Secondary | ICD-10-CM

## 2017-08-19 DIAGNOSIS — I10 Essential (primary) hypertension: Secondary | ICD-10-CM

## 2017-08-19 MED ORDER — IRBESARTAN 300 MG PO TABS: 300.0000 mg | ORAL_TABLET | Freq: Every day | ORAL | 1 refills | Status: DC

## 2017-08-19 NOTE — Telephone Encounter (Signed)
RX changed 

## 2017-08-19 NOTE — Telephone Encounter (Signed)
Notified pt w/new BP med rx sent to [place]...[facility]y [facility]

## 2017-08-23 DIAGNOSIS — G4733 Obstructive sleep apnea (adult) (pediatric): Secondary | ICD-10-CM | POA: Diagnosis not present

## 2017-08-23 IMAGING — DX DG CHEST 2V
2 series · 2 of 2 positions shown · non-contrast
Comparison: 08/29/2015

CLINICAL DATA: Recent diagnosis of pneumonia. Coughing and wheezing
and shortness of breath for 2 weeks. History of hypertension.

EXAM:
CHEST  2 VIEW

[chest pa]
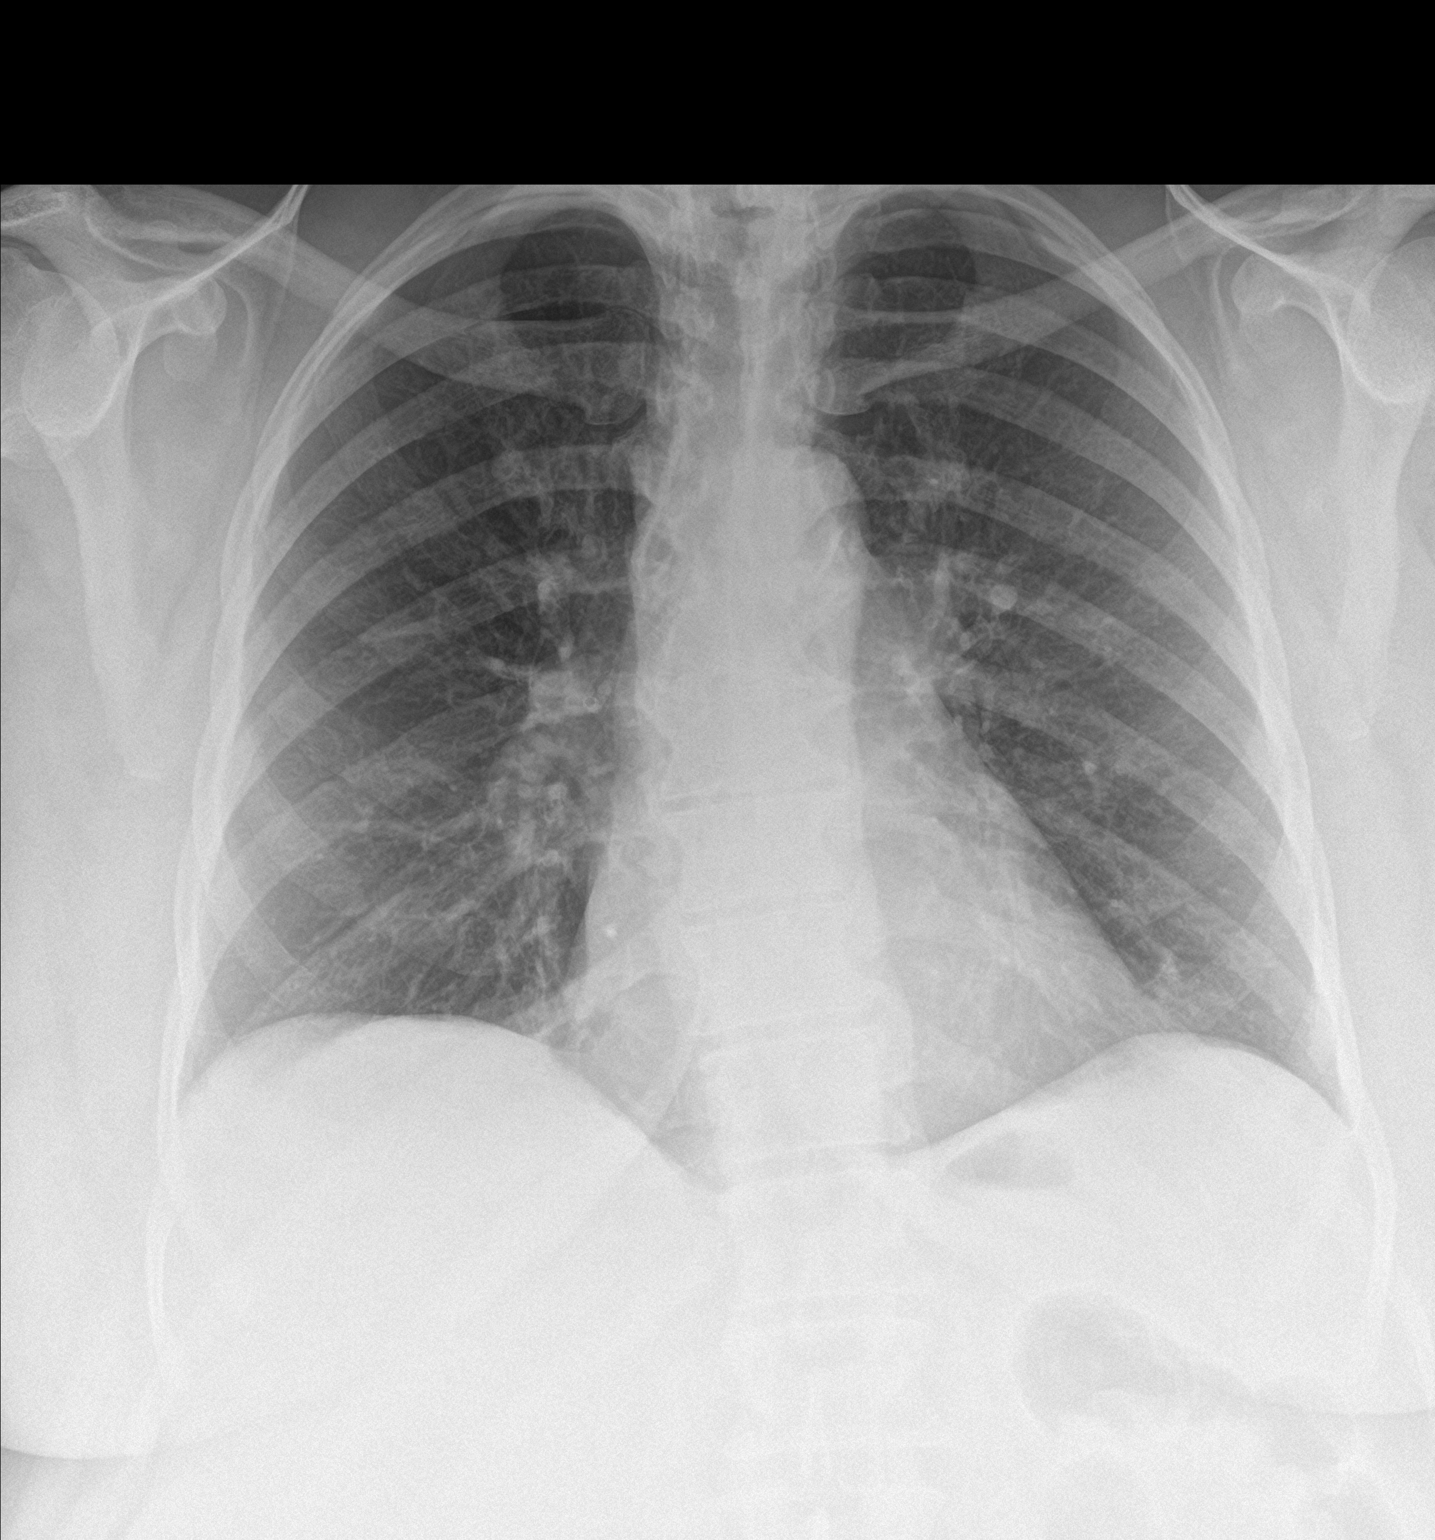

[chest lat]
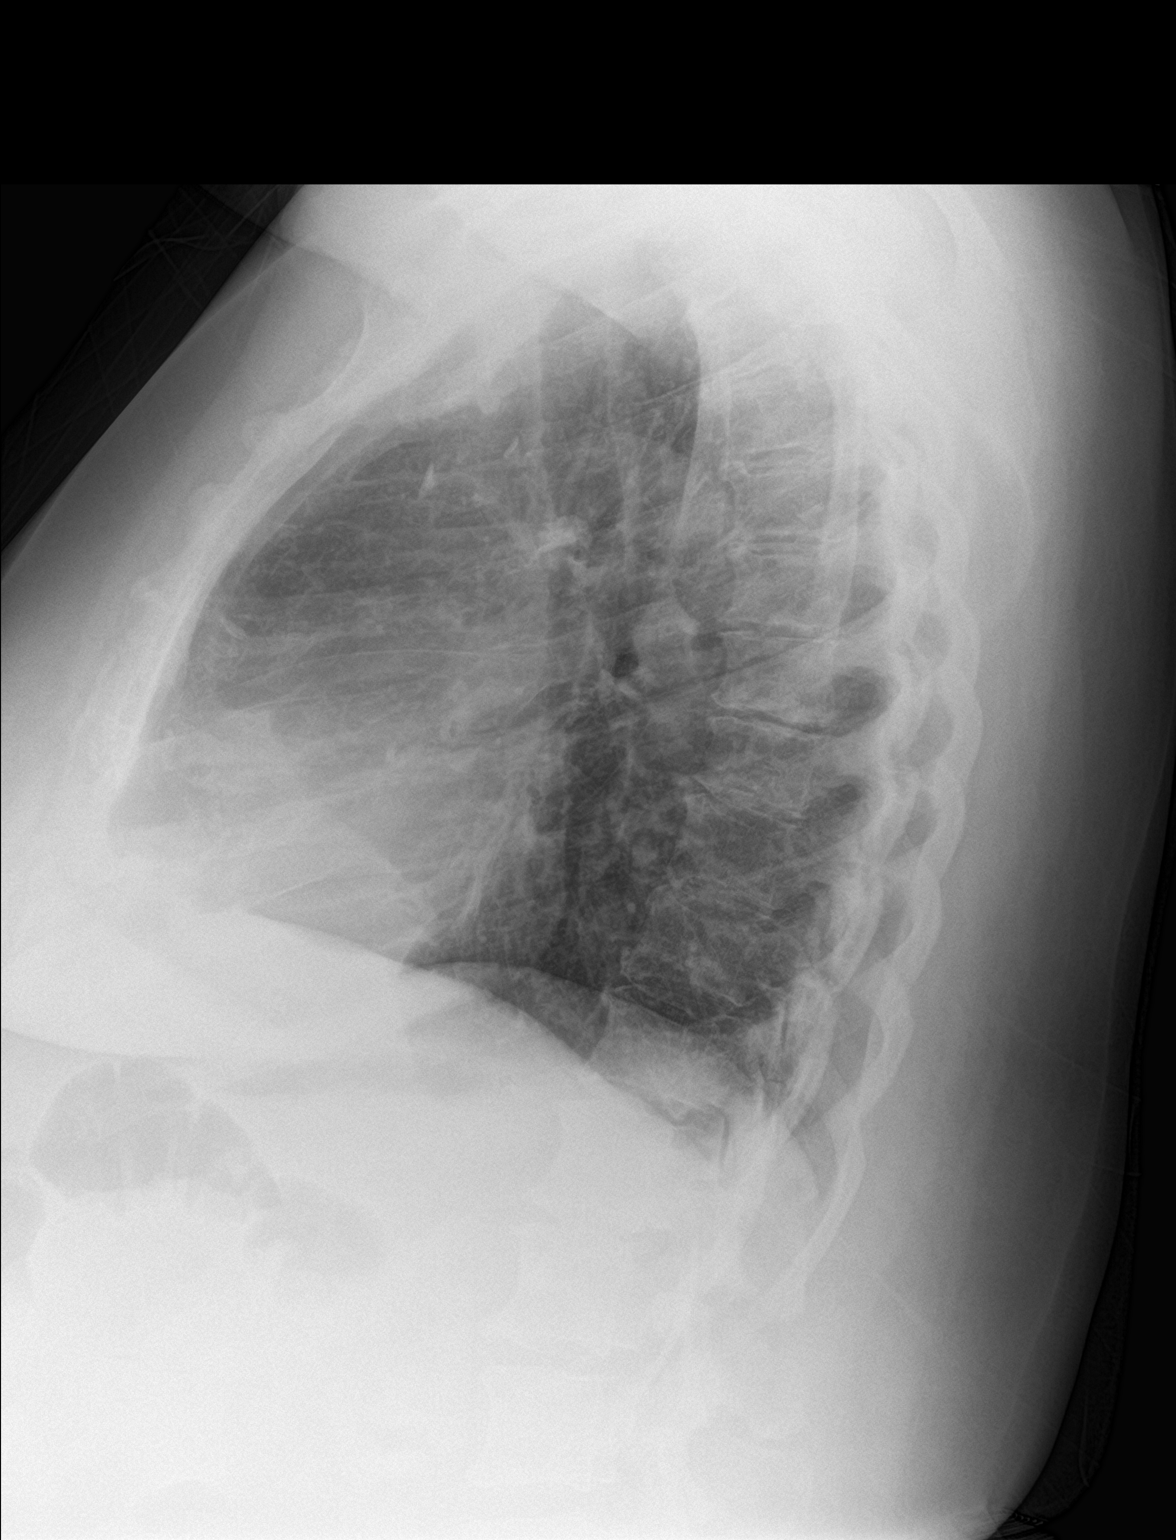

[2 of 2 positions shown; findings below may reference images not displayed]

FINDINGS: Cardiac silhouette is top-normal in size. No mediastinal or hilar
masses or evidence of adenopathy.

Clear lungs.  No pleural effusion or pneumothorax.

Bony thorax is intact.
IMPRESSION: No active cardiopulmonary disease.

## 2017-09-23 DIAGNOSIS — G4733 Obstructive sleep apnea (adult) (pediatric): Secondary | ICD-10-CM | POA: Diagnosis not present

## 2017-10-06 ENCOUNTER — Encounter: Payer: Self-pay | Admitting: Internal Medicine

## 2017-10-06 ENCOUNTER — Ambulatory Visit (INDEPENDENT_AMBULATORY_CARE_PROVIDER_SITE_OTHER): Payer: PPO | Admitting: Internal Medicine

## 2017-10-06 ENCOUNTER — Other Ambulatory Visit (INDEPENDENT_AMBULATORY_CARE_PROVIDER_SITE_OTHER): Payer: PPO

## 2017-10-06 VITALS — BP 138/64 | HR 83 | Temp 98.2°F | Resp 16 | Ht 66.0 in | Wt 263.0 lb

## 2017-10-06 DIAGNOSIS — E118 Type 2 diabetes mellitus with unspecified complications: Secondary | ICD-10-CM | POA: Diagnosis not present

## 2017-10-06 DIAGNOSIS — D508 Other iron deficiency anemias: Secondary | ICD-10-CM

## 2017-10-06 DIAGNOSIS — E785 Hyperlipidemia, unspecified: Secondary | ICD-10-CM

## 2017-10-06 DIAGNOSIS — E781 Pure hyperglyceridemia: Secondary | ICD-10-CM | POA: Diagnosis not present

## 2017-10-06 DIAGNOSIS — K921 Melena: Secondary | ICD-10-CM

## 2017-10-06 DIAGNOSIS — Z23 Encounter for immunization: Secondary | ICD-10-CM | POA: Diagnosis not present

## 2017-10-06 DIAGNOSIS — E032 Hypothyroidism due to medicaments and other exogenous substances: Secondary | ICD-10-CM

## 2017-10-06 DIAGNOSIS — I10 Essential (primary) hypertension: Secondary | ICD-10-CM

## 2017-10-06 LAB — CBC WITH DIFFERENTIAL/PLATELET
Basophils Absolute: 0.1 10*3/uL (ref 0.0–0.1)
Basophils Relative: 1 % (ref 0.0–3.0)
Eosinophils Absolute: 0.3 10*3/uL (ref 0.0–0.7)
Eosinophils Relative: 2.2 % (ref 0.0–5.0)
HCT: 36.2 % (ref 36.0–46.0)
Hemoglobin: 11.6 g/dL — ABNORMAL LOW (ref 12.0–15.0)
Lymphocytes Relative: 31.8 % (ref 12.0–46.0)
Lymphs Abs: 3.9 10*3/uL (ref 0.7–4.0)
MCHC: 32 g/dL (ref 30.0–36.0)
MCV: 88.6 fl (ref 78.0–100.0)
Monocytes Absolute: 1.1 10*3/uL — ABNORMAL HIGH (ref 0.1–1.0)
Monocytes Relative: 9.3 % (ref 3.0–12.0)
Neutro Abs: 6.8 10*3/uL (ref 1.4–7.7)
Neutrophils Relative %: 55.7 % (ref 43.0–77.0)
Platelets: 388 10*3/uL (ref 150.0–400.0)
RBC: 4.09 Mil/uL (ref 3.87–5.11)
RDW: 15.2 % (ref 11.5–15.5)
WBC: 12.3 10*3/uL — ABNORMAL HIGH (ref 4.0–10.5)

## 2017-10-06 LAB — BASIC METABOLIC PANEL
BUN: 16 mg/dL (ref 6–23)
CO2: 29 mEq/L (ref 19–32)
Calcium: 10 mg/dL (ref 8.4–10.5)
Chloride: 96 mEq/L (ref 96–112)
Creatinine, Ser: 0.91 mg/dL (ref 0.40–1.20)
GFR: 78.69 mL/min (ref >60.00)
Glucose, Bld: 122 mg/dL — ABNORMAL HIGH (ref 70–99)
Potassium: 3.9 mEq/L (ref 3.5–5.1)
Sodium: 136 mEq/L (ref 135–145)

## 2017-10-06 LAB — LIPID PANEL
Cholesterol: 135 mg/dL (ref 0–200)
HDL: 46.3 mg/dL (ref >39.00)
NonHDL: 88.28
Total CHOL/HDL Ratio: 3
Triglycerides: 261 mg/dL — ABNORMAL HIGH (ref 0.0–149.0)
VLDL: 52.2 mg/dL — ABNORMAL HIGH (ref 0.0–40.0)

## 2017-10-06 LAB — TSH: TSH: 3.62 u[IU]/mL (ref 0.35–4.50)

## 2017-10-06 LAB — LDL CHOLESTEROL, DIRECT: Direct LDL: 55 mg/dL

## 2017-10-06 NOTE — Progress Notes (Signed)
Subjective:  Patient ID: Valerie [name], female    DOB: 07-May-1946  Age: 71 y.o. MRN: [id number]  CC: Anemia; Hypothyroidism; Hyperlipidemia; Hypertension; and Diabetes   HPI Valerie [name] presents for f/up -she complains of fatigue and persistent, intermittent rectal bleeding.  She was previously referred to GI for a colonoscopy but she has failed to follow thru with that.  She offers no other complaints today.  Outpatient Medications Prior to Visit  Medication Sig Dispense Refill  . albuterol (PROVENTIL) (2.5 MG/3ML) 0.083% nebulizer solution     . albuterol (VENTOLIN HFA) 108 (90 BASE) MCG/ACT inhaler Inhale 2 puffs into the lungs every 6 (six) hours as needed. For shortness of breath 3 Inhaler 4  . ALPRAZolam (XANAX) 0.25 MG tablet TAKE ONE TABLET BY MOUTH TWICE DAILY AS NEEDED FOR ANXIETY 75 tablet 5  . amLODipine (NORVASC) 10 MG tablet TAKE 1 TABLET BY MOUTH ONCE DAILY 90 tablet 1  . aspirin 81 MG tablet Take 81 mg by mouth daily.    [name] atorvastatin (LIPITOR) 20 MG tablet Take 1 tablet (20 mg total) by mouth daily. 90 tablet 3  . carvedilol (COREG) 25 MG tablet TAKE ONE TABLET BY MOUTH TWICE DAILY WITH MEALS 180 tablet 1  . chlorthalidone (HYGROTON) 25 MG tablet Take 1 tablet (25 mg total) by mouth daily. 90 tablet 1  . ferrous sulfate 325 (65 FE) MG tablet Take 1 tablet (325 mg total) by mouth 2 (two) times daily with a meal. 60 tablet 11  . fluticasone furoate-vilanterol (BREO ELLIPTA) 200-25 MCG/INH AEPB Inhale 1 puff into the lungs daily. 30 each 11  . FREESTYLE TEST STRIPS test strip USE TO TEST BLOOD SUGAR UP TO THREE TIMES DAILY 100 each 11  . gabapentin (NEURONTIN) 300 MG capsule TAKE 1 CAPSULE BY MOUTH THREE TIMES DAILY 90 capsule 1  . Insulin Pen Needle 31G X 5 MM MISC Use once daily with insulin 100 each 3  . irbesartan (AVAPRO) 300 MG tablet Take 1 tablet (300 mg total) by mouth daily. 90 tablet 1  . JANUMET XR [phone] MG TB24 TAKE ONE TABLET BY MOUTH ONCE DAILY 90  tablet 1  . latanoprost (XALATAN) 0.005 % ophthalmic solution Place 1 drop into both eyes at bedtime.    [name] omeprazole (PRILOSEC) 40 MG capsule Take 1 capsule (40 mg total) by mouth daily. 90 capsule 3  . oxyCODONE-acetaminophen (PERCOCET) 7.5-325 MG tablet Take 1 tablet by mouth every 6 (six) hours as needed for severe pain. 90 tablet 0  . potassium chloride SA (K-DUR,KLOR-CON) 20 MEQ tablet Take 1 tablet (20 mEq total) by mouth 2 (two) times daily. 180 tablet 1  . sertraline (ZOLOFT) 100 MG tablet Take 1 tablet (100 mg total) by mouth daily. 90 tablet 3  . TOUJEO SOLOSTAR 300 UNIT/ML SOPN INJECT 30 UNITS SUBCUTANEOUSLY ONCE DAILY 6 pen 11   No facility-administered medications prior to visit.     ROS Review of Systems  Constitutional: Positive for fatigue. Negative for appetite change, diaphoresis and unexpected weight change.  HENT: Negative.  Negative for trouble swallowing.   Eyes: Negative.  Negative for visual disturbance.  Respiratory: Negative.  Negative for cough, chest tightness, shortness of breath and wheezing.   Cardiovascular: Negative.  Negative for chest pain, palpitations and leg swelling.  Gastrointestinal: Negative for abdominal pain, constipation, diarrhea, nausea and vomiting.  Endocrine: Negative.  Negative for cold intolerance and heat intolerance.  Genitourinary: Negative.  Negative for decreased urine volume and difficulty urinating.  Musculoskeletal: Negative.   Skin: Negative.   Allergic/Immunologic: Negative.   Neurological: Negative.  Negative for dizziness, weakness and light-headedness.  Hematological: Negative for adenopathy. Does not bruise/bleed easily.  Psychiatric/Behavioral: Negative.     Objective:  BP 138/64 (BP Location: Left Arm, Patient Position: Sitting, Cuff Size: Normal)   Pulse 83   Temp 98.2 F (36.8 C) (Oral)   Resp 16   Ht 5\' 6"  (1.676 m)   Wt 263 lb (119.3 kg)   SpO2 98%   BMI 42.45 kg/m   BP Readings from Last 3 Encounters:    10/06/17 138/64  08/02/17 124/80  06/02/17 (!) 146/80    Wt Readings from Last 3 Encounters:  10/06/17 263 lb (119.3 kg)  08/02/17 268 lb (121.6 kg)  06/02/17 267 lb 4 oz (121.2 kg)    Physical Exam  Constitutional: She is oriented to person, place, and time. No distress.  HENT:  Mouth/Throat: Oropharynx is clear and moist. No oropharyngeal exudate.  Eyes: Conjunctivae are normal. Right eye exhibits no discharge. Left eye exhibits no discharge. No scleral icterus.  Neck: Normal range of motion. Neck supple. No JVD present. No thyromegaly present.  Cardiovascular: Normal rate and regular rhythm.  Exam reveals no gallop.   No murmur heard. Pulmonary/Chest: Effort normal and breath sounds normal. No respiratory distress. She has no wheezes. She has no rales. She exhibits no tenderness.  Abdominal: Soft. Bowel sounds are normal. She exhibits no distension and no mass. There is no tenderness. There is no rebound and no guarding.  Musculoskeletal: Normal range of motion. She exhibits no edema, tenderness or deformity.  Neurological: She is alert and oriented to person, place, and time.  Skin: Skin is warm and dry. No rash noted. She is not diaphoretic. No erythema. No pallor.  Vitals reviewed.   Lab Results  Component Value Date   WBC 12.3 (H) 10/06/2017   HGB 11.6 (L) 10/06/2017   HCT 36.2 10/06/2017   PLT 388.0 10/06/2017   GLUCOSE 122 (H) 10/06/2017   CHOL 135 10/06/2017   TRIG 261.0 (H) 10/06/2017   HDL 46.30 10/06/2017   LDLDIRECT 55.0 10/06/2017   LDLCALC 43 03/28/2014   ALT 30 03/24/2017   AST 31 03/24/2017   NA 136 10/06/2017   K 3.9 10/06/2017   CL 96 10/06/2017   CREATININE 0.91 10/06/2017   BUN 16 10/06/2017   CO2 29 10/06/2017   TSH 3.62 10/06/2017   INR 1.06 08/29/2015   HGBA1C 7.6 08/02/2017   MICROALBUR <0.7 03/24/2017    Dg Chest 2 View  Result Date: 04/16/2016 CLINICAL DATA:  Recent diagnosis of pneumonia. Coughing and wheezing and shortness of  breath for 2 weeks. History of hypertension. EXAM: CHEST  2 VIEW COMPARISON:  08/29/2015 FINDINGS: Cardiac silhouette is top-normal in size. No mediastinal or hilar masses or evidence of adenopathy. Clear lungs.  No pleural effusion or pneumothorax. Bony thorax is intact. IMPRESSION: No active cardiopulmonary disease. Electronically Signed   By: Lajean Manes M.D.   On: 04/16/2016 15:36    Assessment & Plan:   Samanvi was seen today for anemia, hypothyroidism, hyperlipidemia, hypertension and diabetes.  Diagnoses and all orders for this visit:  Essential hypertension, benign- her blood pressure is well controlled.  Electrolytes and renal function are normal. -     Basic metabolic panel; Future  Hypothyroidism due to medication- Her TSH is in the normal range.  She will remain on the current dose of levothyroxine. -     TSH;  Future  Type 2 diabetes mellitus with complication, without long-term current use of insulin (HCC) -     Basic metabolic panel; Future  Other iron deficiency anemia-her H&H have improved.  We will continue iron replacement therapy. -     CBC with Differential/Platelet; Future -     Ambulatory referral to Gastroenterology  Hyperlipidemia with target LDL less than 100- she has achieved her LDL goal is doing well on the statin. -     Lipid panel; Future  Pure hyperglyceridemia- her triglycerides remain mildly elevated.  I have encouraged her to work on her lifestyle modifications. -     Lipid panel; Future  Need for influenza vaccination -     Flu vaccine HIGH DOSE PF (Fluzone High dose)  Blood in stool, frank -     Ambulatory referral to Gastroenterology   I am having Ms. Copado maintain her latanoprost, albuterol, aspirin, Insulin Pen Needle, TOUJEO SOLOSTAR, albuterol, fluticasone furoate-vilanterol, ferrous sulfate, sertraline, FREESTYLE TEST STRIPS, chlorthalidone, omeprazole, atorvastatin, carvedilol, oxyCODONE-acetaminophen, potassium chloride SA, JANUMET XR,  amLODipine, gabapentin, ALPRAZolam, and irbesartan.  No orders of the defined types were placed in this encounter.    Follow-up: Return in about 6 months (around 04/06/2018).  [name], MD

## 2017-10-06 NOTE — Patient Instructions (Signed)
Hypothyroidism Hypothyroidism is a disorder of the thyroid. The thyroid is a large gland that is located in the lower front of the neck. The thyroid releases hormones that control how the body works. With hypothyroidism, the thyroid does not make enough of these hormones. What are the causes? Causes of hypothyroidism may include:  Viral infections.  Pregnancy.  Your own defense system (immune system) attacking your thyroid.  Certain medicines.  Birth defects.  Past radiation treatments to your head or neck.  Past treatment with radioactive iodine.  Past surgical removal of part or all of your thyroid.  Problems with the gland that is located in the center of your brain (pituitary).  What are the signs or symptoms? Signs and symptoms of hypothyroidism may include:  Feeling as though you have no energy (lethargy).  Inability to tolerate cold.  Weight gain that is not explained by a change in diet or exercise habits.  Dry skin.  Coarse hair.  Menstrual irregularity.  Slowing of thought processes.  Constipation.  Sadness or depression.  How is this diagnosed? Your health care provider may diagnose hypothyroidism with blood tests and ultrasound tests. How is this treated? Hypothyroidism is treated with medicine that replaces the hormones that your body does not make. After you begin treatment, it may take several weeks for symptoms to go away. Follow these instructions at home:  Take medicines only as directed by your health care provider.  If you start taking any new medicines, tell your health care provider.  Keep all follow-up visits as directed by your health care provider. This is important. As your condition improves, your dosage needs may change. You will need to have blood tests regularly so that your health care provider can watch your condition. Contact a health care provider if:  Your symptoms do not get better with treatment.  You are taking thyroid  replacement medicine and: ? You sweat excessively. ? You have tremors. ? You feel anxious. ? You lose weight rapidly. ? You cannot tolerate heat. ? You have emotional swings. ? You have diarrhea. ? You feel weak. Get help right away if:  You develop chest pain.  You develop an irregular heartbeat.  You develop a rapid heartbeat. This information is not intended to replace advice given to you by your health care provider. Make sure you discuss any questions you have with your health care provider. Document Released: 11/30/2005 Document Revised: 05/07/2016 Document Reviewed: 04/17/2014 Elsevier Interactive Patient Education  2017 [employer]  

## 2017-10-07 ENCOUNTER — Encounter: Payer: Self-pay | Admitting: Internal Medicine

## 2017-10-07 DIAGNOSIS — M79671 Pain in right foot: Secondary | ICD-10-CM | POA: Diagnosis not present

## 2017-10-07 DIAGNOSIS — K921 Melena: Secondary | ICD-10-CM | POA: Insufficient documentation

## 2017-10-07 DIAGNOSIS — E1351 Other specified diabetes mellitus with diabetic peripheral angiopathy without gangrene: Secondary | ICD-10-CM | POA: Diagnosis not present

## 2017-10-13 ENCOUNTER — Ambulatory Visit: Payer: PPO | Admitting: Internal Medicine

## 2017-10-14 DIAGNOSIS — L603 Nail dystrophy: Secondary | ICD-10-CM | POA: Diagnosis not present

## 2017-10-14 DIAGNOSIS — L259 Unspecified contact dermatitis, unspecified cause: Secondary | ICD-10-CM | POA: Diagnosis not present

## 2017-10-27 NOTE — Progress Notes (Addendum)
Subjective:   Valerie [name] is a 71 y.o. female who presents for Medicare Annual (Subsequent) preventive examination.  Review of Systems:  No ROS.  Medicare Wellness Visit. Additional risk factors are reflected in the social history.  Cardiac Risk Factors include: advanced age (>73mn, >>32women);diabetes mellitus;dyslipidemia;hypertension;obesity (BMI >30kg/m2) Sleep patterns: has frequent nighttime awakenings, gets up 1-2 times nightly to void and sleeps 4-5 hours nightly. Patient reports insomnia issues, discussed recommended sleep tips and stress reduction tips, education was attached to patient's AVS.  Home Safety/Smoke Alarms: Feels safe in home. Smoke alarms in place.  Living environment; residence and Firearm Safety: 1-story house/ trailer, no firearms. Lives with family no needs for DME, limited support system Seat Belt Safety/Bike Helmet: Wears seat belt.     Objective:     Vitals: BP 118/60   Pulse 75   Resp 20   Ht '5\' 6"'$  (1.676 m)   Wt 269 lb (122 kg)   SpO2 98%   BMI 43.42 kg/m   Body mass index is 43.42 kg/m.   Tobacco Social History   Tobacco Use  Smoking Status Never Smoker  Smokeless Tobacco Never Used     Counseling given: Not Answered   Past Medical History:  Diagnosis Date  . Anemia   . Anxiety and depression   . Asthma   . Degenerative arthritis   . Depression   . Diabetes mellitus, type 2 (HOrient   . Fatigue   . GERD (gastroesophageal reflux disease)   . Glaucoma   . Hemorrhoids   . Hyperlipidemia   . Hypertension   . Hypothyroid   . Hypoxemia 11/24/2013  . Memory deficit 10/04/2013  . Obesity   . Sleep apnea   . Snoring disorder    Past Surgical History:  Procedure Laterality Date  . benign tumors resected    . CATARACT EXTRACTION Left   . FOOT SURGERY Left    bone spur  . REFRACTIVE SURGERY     Glaucoma  . ROTATOR CUFF REPAIR    . TUBAL LIGATION     Family History  Problem Relation Age of Onset  . Alcohol abuse  Mother   . Heart attack Mother   . Coronary artery disease Brother   . Heart attack Father   . Heart disease Sister   . Atrial fibrillation Sister   . Hypertension Sister   . Hypertension Unknown        family history  . Alcohol abuse Unknown    Social History   Substance and Sexual Activity  Sexual Activity Not Currently   Comment: not working, lives with husband, 3 sons    Outpatient Encounter Medications as of 10/28/2017  Medication Sig  . albuterol (PROVENTIL) (2.5 MG/3ML) 0.083% nebulizer solution   . albuterol (VENTOLIN HFA) 108 (90 BASE) MCG/ACT inhaler Inhale 2 puffs into the lungs every 6 (six) hours as needed. For shortness of breath  . ALPRAZolam (XANAX) 0.25 MG tablet TAKE ONE TABLET BY MOUTH TWICE DAILY AS NEEDED FOR ANXIETY  . amLODipine (NORVASC) 10 MG tablet TAKE 1 TABLET BY MOUTH ONCE DAILY  . aspirin 81 MG tablet Take 81 mg by mouth daily.  .Marland Kitchenatorvastatin (LIPITOR) 20 MG tablet Take 1 tablet (20 mg total) by mouth daily.  . carvedilol (COREG) 25 MG tablet TAKE ONE TABLET BY MOUTH TWICE DAILY WITH MEALS  . chlorthalidone (HYGROTON) 25 MG tablet Take 1 tablet (25 mg total) by mouth daily.  . ferrous sulfate 325 (65 FE) MG  tablet Take 1 tablet (325 mg total) by mouth 2 (two) times daily with a meal.  . fluticasone furoate-vilanterol (BREO ELLIPTA) 200-25 MCG/INH AEPB Inhale 1 puff into the lungs daily.  [name] FREESTYLE TEST STRIPS test strip USE TO TEST BLOOD SUGAR UP TO THREE TIMES DAILY  . gabapentin (NEURONTIN) 300 MG capsule TAKE 1 CAPSULE BY MOUTH THREE TIMES DAILY  . Insulin Pen Needle 31G X 5 MM MISC Use once daily with insulin  . irbesartan (AVAPRO) 300 MG tablet Take 1 tablet (300 mg total) by mouth daily.  [name] JANUMET XR [phone] MG TB24 TAKE ONE TABLET BY MOUTH ONCE DAILY  . latanoprost (XALATAN) 0.005 % ophthalmic solution Place 1 drop into both eyes at bedtime.  [name] omeprazole (PRILOSEC) 40 MG capsule Take 1 capsule (40 mg total) by mouth daily.  [name]  oxyCODONE-acetaminophen (PERCOCET) 7.5-325 MG tablet Take 1 tablet by mouth every 6 (six) hours as needed for severe pain.  . potassium chloride SA (K-DUR,KLOR-CON) 20 MEQ tablet Take 1 tablet (20 mEq total) by mouth 2 (two) times daily.  . TOUJEO SOLOSTAR 300 UNIT/ML SOPN INJECT 30 UNITS SUBCUTANEOUSLY ONCE DAILY  . sertraline (ZOLOFT) 100 MG tablet Take 1 tablet (100 mg total) by mouth daily. (Patient not taking: Reported on 10/28/2017)   No facility-administered encounter medications on file as of 10/28/2017.     Activities of Daily Living In your present state of health, do you have any difficulty performing the following activities: 10/28/2017  Hearing? N  Vision? N  Difficulty concentrating or making decisions? N  Walking or climbing stairs? Y  Dressing or bathing? N  Doing errands, shopping? N  Preparing Food and eating ? N  Using the Toilet? N  In the past six months, have you accidently leaked urine? N  Do you have problems with loss of bowel control? N  Managing your Medications? N  Managing your Finances? N  Housekeeping or managing your Housekeeping? N  Some recent data might be hidden    Patient Care Team: Janith Lima, MD as PCP - General Dohmeier, Asencion Partridge, MD as Consulting Physician (Neurology) Heath Lark, MD as Consulting Physician (Hematology and Oncology)    Assessment:    Physical assessment deferred to PCP.  Exercise Activities and Dietary recommendations Current Exercise Habits: The patient does not participate in regular exercise at present(chair exercise pamphlets provided), Exercise limited by: orthopedic condition(s) Diet (meal preparation, eat out, water intake, caffeinated beverages, dairy products, fruits and vegetables): in general, an "unhealthy" diet, on average, 2 meals per day   Reviewed heart healthy and diabetic diet, discussed weight loss tips, encouraged patient to increase daily water intake., Diet education was attached to patient's  AVS.  Goals    . Attending meditation / other stress management classes     [facility]; will try the pool  Active Prayer life; States she can work on this/ Will go to prayer service at [employer] to sister Would like to get back into walking/ may try pool   Apt with Dr. Ronnald Ramp to discuss more affordable meds for depression if she continues to feel bad or mood gets more depressed Is functioning at present  Wellness nurse to put together some resources for disabled son      Fall Risk Fall Risk  10/28/2017 10/06/2017 10/06/2017 04/29/2016 04/29/2015  Falls in the past year? No No No No No   Depression Screen PHQ 2/9 Scores 10/28/2017 06/02/2017 04/29/2016 04/29/2015  PHQ -  2 Score 2 - 2 3  PHQ- 9 Score 11 - - 9  Exception Documentation - Patient refusal - -     Cognitive Function MMSE - Mini Mental State Exam 04/29/2016 04/29/2015 04/29/2015  Not completed: (No Data) (No Data) Unable to complete       Ad8 score reviewed for issues:  Issues making decisions: no  Less interest in hobbies / activities: no  Repeats questions, stories (family complaining): no  Trouble using ordinary gadgets (microwave, computer, phone):no  Forgets the month or year: no  Mismanaging finances: no  Remembering appts: no  Daily problems with thinking and/or memory: no Ad8 score is= 0  Immunization History  Administered Date(s) Administered  . Influenza Split 09/21/2011, 08/19/2012  . Influenza Whole 09/25/2009, 09/04/2010  . Influenza, High Dose Seasonal PF 09/23/2016, 10/06/2017  . Influenza,inj,Quad PF,6+ Mos 09/01/2013, 11/29/2014, 08/30/2015  . Pneumococcal Conjugate-13 07/13/2014  . Pneumococcal Polysaccharide-23 01/20/2013  . Td 02/05/2010  . Tdap 06/02/2017   Screening Tests Health Maintenance  Topic Date Due  . COLONOSCOPY  02/22/2017  . OPHTHALMOLOGY EXAM  09/17/2017  . PNA vac Low Risk Adult (2 of 2 - PPSV23) 01/20/2018  . HEMOGLOBIN A1C  02/02/2018  .  FOOT EXAM  03/24/2018  . MAMMOGRAM  07/27/2019  . TETANUS/TDAP  06/03/2027  . INFLUENZA VACCINE  Completed  . DEXA SCAN  Completed  . Hepatitis C Screening  Completed      Plan:     Support resources provided to patient for respite care, support groups, counseling resources.  Start doing brain stimulating activities (puzzles, reading, adult coloring books, staying active) to keep memory sharp.   Start to eat heart healthy diet (full of fruits, vegetables, whole grains, lean protein, water--limit salt, fat, and sugar intake) and increase physical activity as tolerated.  I have personally reviewed and noted the following in the patient's chart:   . Medical and social history . Use of alcohol, tobacco or illicit drugs  . Current medications and supplements . Functional ability and status . Nutritional status . Physical activity . Advanced directives . List of other physicians . Vitals . Screenings to include cognitive, depression, and falls . Referrals and appointments  In addition, I have reviewed and discussed with patient certain preventive protocols, quality metrics, and best practice recommendations. A written personalized care plan for preventive services as well as general preventive health recommendations were provided to patient.     Michiel Cowboy, RN  10/28/2017  Medical screening examination/treatment/procedure(s) were performed by non-physician practitioner and as supervising physician I was immediately available for consultation/collaboration. I agree with above. [name], MD

## 2017-10-28 ENCOUNTER — Ambulatory Visit: Payer: PPO | Admitting: *Deleted

## 2017-10-28 VITALS — BP 118/60 | HR 75 | Resp 20 | Ht 66.0 in | Wt 269.0 lb

## 2017-10-28 DIAGNOSIS — Z1211 Encounter for screening for malignant neoplasm of colon: Secondary | ICD-10-CM

## 2017-10-28 DIAGNOSIS — Z0000 Encounter for general adult medical examination without abnormal findings: Secondary | ICD-10-CM

## 2017-10-28 NOTE — Patient Instructions (Addendum)
Start doing brain stimulating activities (puzzles, reading, adult coloring books, staying active) to keep memory sharp.   Start to eat heart healthy diet (full of fruits, vegetables, whole grains, lean protein, water--limit salt, fat, and sugar intake) and increase physical activity as tolerated.  Down load IHeart[employer] or pandora to listen to music.  Make an eye appointment as soon as you can.   Valerie [name] , Thank you for taking time to come for your Medicare Wellness Visit. I appreciate your ongoing commitment to your health goals. Please review the following plan we discussed and let me know if I can assist you in the future.   These are the goals we discussed: Start to monitor my diet more closely. Increase water intake. I am going to find time for myself. Listen to gospel music. Goals    . Attending meditation / other stress management classes     [facility] 2 [facility] a [facility]; will try the pool  Active Prayer life; States she can work on this/ Will go to prayer service at [employer] to sister Would like to get back into walking/ may try pool   Apt with Dr. Ronnald Ramp to discuss more affordable meds for depression if she continues to feel bad or mood gets more depressed Is functioning at present  Wellness nurse to put together some resources for disabled son       This is a list of the screening recommended for you and due dates:  Health Maintenance  Topic Date Due  . Colon Cancer Screening  02/22/2017  . Eye exam for diabetics  09/17/2017  . Pneumonia vaccines (2 of 2 - PPSV23) 01/20/2018  . Hemoglobin A1C  02/02/2018  . Complete foot exam   03/24/2018  . Mammogram  07/27/2019  . Tetanus Vaccine  06/03/2027  . Flu Shot  Completed  . DEXA scan (bone density measurement)  Completed  .  Hepatitis C: One time screening is recommended by Center for Disease Control  (CDC) for  adults born from [birth year] through 1963.   Completed      AARPMedicareComplete Insured - Through  [employer]. [email or web address] [phone] Find Medicare Insurance Plans That Fit Your Needs. Enroll Online Today. Medicare Advantage Plans. Call from 8 AM to 8 PM. [place]. Call 7 Days a Week. Rx Drug Coverage. Includes Silver Sneakers.  [facility] Dennard Nip, MD Specialties and/or Subspecialties Medical Weight Loss Management  [phone] No Review - Why Not? 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 She specializes in the medical treatment of obesity and related conditions. She believes in treating the whole patient, managing obesity as a chronic disease, and improving long-term health outcomes.  Empathetic and dedicated to improving her patients' lives, her approach is based on proven treatments and the latest research. She is sensitive to her patients' health and weight issues, and fosters a judgment-free environment.  In her free time, she enjoys crocheting and adding to her shoe collection.  "I get to know my patients in-depth since so many aspects of their lives affect their health and weight. I am sensitive to the prejudice the majority of overweight people have faced, and I foster a blame free, shame free environment. Patients can feel comfortable and safe being honest with me about the challenges they face on their journey to become healthier." - Dennard Nip, MD   Heart-Healthy Eating Plan Heart-healthy meal planning includes:  Limiting unhealthy fats.  Increasing  healthy fats.  Making other small dietary changes.  You may need to talk with your doctor or a diet specialist (dietitian) to create an eating plan that is right for you. What types of fat should I choose?  Choose healthy fats. These include olive oil and canola oil, flaxseeds, walnuts, almonds, and seeds.  Eat more omega-3 fats. These include salmon, mackerel, sardines, tuna, flaxseed oil,  and ground flaxseeds. Try to eat fish at least twice each week.  Limit saturated fats. ? Saturated fats are often found in animal products, such as meats, butter, and cream. ? Plant sources of saturated fats include palm oil, palm kernel oil, and coconut oil.  Avoid foods with partially hydrogenated oils in them. These include stick margarine, some tub margarines, cookies, crackers, and other baked goods. These contain trans fats. What general guidelines do I need to follow?  Check food labels carefully. Identify foods with trans fats or high amounts of saturated fat.  Fill one half of your plate with vegetables and [place] salads. Eat 4-5 servings of vegetables per day. A serving of vegetables is: ? 1 cup of raw leafy vegetables. ?  cup of raw or cooked cut-up vegetables. ?  cup of vegetable juice.  Fill one fourth of your plate with whole grains. Look for the word "whole" as the first word in the ingredient list.  Fill one fourth of your plate with lean protein foods.  Eat 4-5 servings of fruit per day. A serving of fruit is: ? One medium whole fruit. ?  cup of dried fruit. ?  cup of fresh, frozen, or canned fruit. ?  cup of 100% fruit juice.  Eat more foods that contain soluble fiber. These include apples, broccoli, carrots, beans, peas, and barley. Try to get 20-30 g of fiber per day.  Eat more home-cooked food. Eat less restaurant, buffet, and fast food.  Limit or avoid alcohol.  Limit foods high in starch and sugar.  Avoid fried foods.  Avoid frying your food. Try baking, boiling, grilling, or broiling it instead. You can also reduce fat by: ? Removing the skin from poultry. ? Removing all visible fats from meats. ? Skimming the fat off of stews, soups, and gravies before serving them. ? Steaming vegetables in water or broth.  Lose weight if you are overweight.  Eat 4-5 servings of nuts, legumes, and seeds per week: ? One serving of dried beans or legumes equals   cup after being cooked. ? One serving of nuts equals 1 ounces. ? One serving of seeds equals  ounce or one tablespoon.  You may need to keep track of how much salt or sodium you eat. This is especially true if you have high blood pressure. Talk with your doctor or dietitian to get more information. What foods can I eat? Grains Breads, including [place], white, pita, wheat, raisin, rye, oatmeal, and [place]. Tortillas that are neither fried nor made with lard or trans fat. Low-fat rolls, including hotdog and hamburger buns and English muffins. Biscuits. Muffins. Waffles. Pancakes. Light popcorn. Whole-grain cereals. Flatbread. Melba toast. Pretzels. Breadsticks. Rusks. Low-fat snacks. Low-fat crackers, including oyster, saltine, matzo, graham, animal, and rye. Rice and pasta, including brown rice and pastas that are made with whole wheat. Vegetables All vegetables. Fruits All fruits, but limit coconut. Meats and Other Protein Sources Lean, well-trimmed beef, veal, pork, and lamb. Chicken and [place] without skin. All fish and shellfish. Wild duck, rabbit, pheasant, and venison. Egg whites or low-cholesterol egg substitutes.  Dried beans, peas, lentils, and tofu. Seeds and most nuts. Dairy Low-fat or nonfat cheeses, including ricotta, string, and mozzarella. Skim or 1% milk that is liquid, powdered, or evaporated. Buttermilk that is made with low-fat milk. Nonfat or low-fat yogurt. Beverages Mineral water. Diet carbonated beverages. Sweets and Desserts Sherbets and fruit ices. Honey, jam, marmalade, jelly, and syrups. Meringues and gelatins. Pure sugar candy, such as hard candy, jelly beans, gumdrops, mints, marshmallows, and small amounts of dark chocolate. [employer] Eat all sweets and desserts in moderation. Fats and Oils Nonhydrogenated (trans-free) margarines. Vegetable oils, including soybean, sesame, sunflower, olive, peanut, safflower, corn, canola, and cottonseed. Salad dressings  or mayonnaise made with a vegetable oil. Limit added fats and oils that you use for cooking, baking, salads, and as spreads. Other Cocoa powder. Coffee and tea. All seasonings and condiments. The items listed above may not be a complete list of recommended foods or beverages. Contact your dietitian for more options. What foods are not recommended? Grains Breads that are made with saturated or trans fats, oils, or whole milk. Croissants. Butter rolls. Cheese breads. Sweet rolls. Donuts. Buttered popcorn. Chow mein noodles. High-fat crackers, such as cheese or butter crackers. Meats and Other Protein Sources Fatty meats, such as hotdogs, short ribs, sausage, spareribs, bacon, rib eye roast or steak, and mutton. High-fat deli meats, such as salami and bologna. Caviar. Domestic duck and goose. Organ meats, such as kidney, liver, sweetbreads, and heart. Dairy Cream, sour cream, cream cheese, and creamed cottage cheese. Whole-milk cheeses, including blue (bleu), [place] Jack, [place], [place], American, [place], Swiss, cheddar, [place], and [place]. Whole or 2% milk that is liquid, evaporated, or condensed. Whole buttermilk. Cream sauce or high-fat cheese sauce. Yogurt that is made from whole milk. Beverages Regular sodas and juice drinks with added sugar. Sweets and Desserts Frosting. Pudding. Cookies. Cakes other than angel food cake. Candy that has milk chocolate or white chocolate, hydrogenated fat, butter, coconut, or unknown ingredients. Buttered syrups. Full-fat ice cream or ice cream drinks. Fats and Oils Gravy that has suet, meat fat, or shortening. Cocoa butter, hydrogenated oils, palm oil, coconut oil, palm kernel oil. These can often be found in baked products, candy, fried foods, nondairy creamers, and whipped toppings. Solid fats and shortenings, including bacon fat, salt pork, lard, and butter. Nondairy cream substitutes, such as coffee creamers and sour cream substitutes. Salad dressings  that are made of unknown oils, cheese, or sour cream. The items listed above may not be a complete list of foods and beverages to avoid. Contact your dietitian for more information. This information is not intended to replace advice given to you by your health care provider. Make sure you discuss any questions you have with your health care provider. Document Released: 05/31/2012 Document Revised: 05/07/2016 Document Reviewed: 05/24/2014 Elsevier Interactive Patient Education  2018 [employer].  Carbohydrate Counting for Diabetes Mellitus, Adult Carbohydrate counting is a method for keeping track of how many carbohydrates you eat. Eating carbohydrates naturally increases the amount of sugar (glucose) in the blood. Counting how many carbohydrates you eat helps keep your blood glucose within normal limits, which helps you manage your diabetes (diabetes mellitus). It is important to know how many carbohydrates you can safely have in each meal. This is different for every person. A diet and nutrition specialist (registered dietitian) can help you make a meal plan and calculate how many carbohydrates you should have at each meal and snack. Carbohydrates are found in the following foods:  Grains,  such as breads and cereals.  Dried beans and soy products.  Starchy vegetables, such as potatoes, peas, and corn.  Fruit and fruit juices.  Milk and yogurt.  Sweets and snack foods, such as cake, cookies, candy, chips, and soft drinks.  How do I count carbohydrates? There are two ways to count carbohydrates in food. You can use either of the methods or a combination of both. Reading "Nutrition Facts" on packaged food The "Nutrition Facts" list is included on the labels of almost all packaged foods and beverages in the [place] It includes:  The serving size.  Information about nutrients in each serving, including the grams (g) of carbohydrate per serving.  To use the "Nutrition Facts":  Decide how  many servings you will have.  Multiply the number of servings by the number of carbohydrates per serving.  The resulting number is the total amount of carbohydrates that you will be having.  Learning standard serving sizes of other foods When you eat foods containing carbohydrates that are not packaged or do not include "Nutrition Facts" on the label, you need to measure the servings in order to count the amount of carbohydrates:  Measure the foods that you will eat with a food scale or measuring cup, if needed.  Decide how many standard-size servings you will eat.  Multiply the number of servings by 15. Most carbohydrate-rich foods have about 15 g of carbohydrates per serving. ? For example, if you eat 8 oz (170 g) of strawberries, you will have eaten 2 servings and 30 g of carbohydrates (2 servings x 15 g = 30 g).  For foods that have more than one food mixed, such as soups and casseroles, you must count the carbohydrates in each food that is included.  The following list contains standard serving sizes of common carbohydrate-rich foods. Each of these servings has about 15 g of carbohydrates:   hamburger bun or  English muffin.   oz (15 mL) syrup.   oz (14 g) jelly.  1 slice of bread.  1 six-inch tortilla.  3 oz (85 g) cooked rice or pasta.  4 oz (113 g) cooked dried beans.  4 oz (113 g) starchy vegetable, such as peas, corn, or potatoes.  4 oz (113 g) hot cereal.  4 oz (113 g) mashed potatoes or  of a large baked potato.  4 oz (113 g) canned or frozen fruit.  4 oz (120 mL) fruit juice.  4-6 crackers.  6 chicken nuggets.  6 oz (170 g) unsweetened dry cereal.  6 oz (170 g) plain fat-free yogurt or yogurt sweetened with artificial sweeteners.  8 oz (240 mL) milk.  8 oz (170 g) fresh fruit or one small piece of fruit.  24 oz (680 g) popped popcorn.  Example of carbohydrate counting Sample meal  3 oz (85 g) chicken breast.  6 oz (170 g) brown  rice.  4 oz (113 g) corn.  8 oz (240 mL) milk.  8 oz (170 g) strawberries with sugar-free whipped topping. Carbohydrate calculation 1. Identify the foods that contain carbohydrates: ? Rice. ? Corn. ? Milk. ? Strawberries. 2. Calculate how many servings you have of each food: ? 2 servings rice. ? 1 serving corn. ? 1 serving milk. ? 1 serving strawberries. 3. Multiply each number of servings by 15 g: ? 2 servings rice x 15 g = 30 g. ? 1 serving corn x 15 g = 15 g. ? 1 serving milk x 15 g = 15  g. ? 1 serving strawberries x 15 g = 15 g. 4. Add together all of the amounts to find the total grams of carbohydrates eaten: ? 30 g + 15 g + 15 g + 15 g = 75 g of carbohydrates total. This information is not intended to replace advice given to you by your health care provider. Make sure you discuss any questions you have with your health care provider. Document Released: 11/30/2005 Document Revised: 06/19/2016 Document Reviewed: 05/13/2016 Elsevier Interactive Patient Education  2018 [employer]. Insomnia Insomnia is a sleep disorder that makes it difficult to fall asleep or to stay asleep. Insomnia can cause tiredness (fatigue), low energy, difficulty concentrating, mood swings, and poor performance at work or school. There are three different ways to classify insomnia:  Difficulty falling asleep.  Difficulty staying asleep.  Waking up too early in the morning.  Any type of insomnia can be long-term (chronic) or short-term (acute). Both are common. Short-term insomnia usually lasts for three months or less. Chronic insomnia occurs at least three times a week for longer than three months. What are the causes? Insomnia may be caused by another condition, situation, or substance, such as:  Anxiety.  Certain medicines.  Gastroesophageal reflux disease (GERD) or other gastrointestinal conditions.  Asthma or other breathing conditions.  Restless legs syndrome, sleep apnea, or other  sleep disorders.  Chronic pain.  Menopause. This may include hot flashes.  Stroke.  Abuse of alcohol, tobacco, or illegal drugs.  Depression.  Caffeine.  Neurological disorders, such as Alzheimer disease.  An overactive thyroid (hyperthyroidism).  The cause of insomnia may not be known. What increases the risk? Risk factors for insomnia include:  Gender. Women are more commonly affected than men.  Age. Insomnia is more common as you get older.  Stress. This may involve your professional or personal life.  Income. Insomnia is more common in people with lower income.  Lack of exercise.  Irregular work schedule or night shifts.  Traveling between different time zones.  What are the signs or symptoms? If you have insomnia, trouble falling asleep or trouble staying asleep is the main symptom. This may lead to other symptoms, such as:  Feeling fatigued.  Feeling nervous about going to sleep.  Not feeling rested in the morning.  Having trouble concentrating.  Feeling irritable, anxious, or depressed.  How is this treated? Treatment for insomnia depends on the cause. If your insomnia is caused by an underlying condition, treatment will focus on addressing the condition. Treatment may also include:  Medicines to help you sleep.  Counseling or therapy.  Lifestyle adjustments.  Follow these instructions at home:  Take medicines only as directed by your health care provider.  Keep regular sleeping and waking hours. Avoid naps.  Keep a sleep diary to help you and your health care provider figure out what could be causing your insomnia. Include: ? When you sleep. ? When you wake up during the night. ? How well you sleep. ? How rested you feel the next day. ? Any side effects of medicines you are taking. ? What you eat and drink.  Make your bedroom a comfortable place where it is easy to fall asleep: ? Put up shades or special blackout curtains to block light  from outside. ? Use a white noise machine to block noise. ? Keep the temperature cool.  Exercise regularly as directed by your health care provider. Avoid exercising right before bedtime.  Use relaxation techniques to manage stress. Ask  your health care provider to suggest some techniques that may work well for you. These may include: ? Breathing exercises. ? Routines to release muscle tension. ? Visualizing peaceful scenes.  Cut back on alcohol, caffeinated beverages, and cigarettes, especially close to bedtime. These can disrupt your sleep.  Do not overeat or eat spicy foods right before bedtime. This can lead to digestive discomfort that can make it hard for you to sleep.  Limit screen use before bedtime. This includes: ? Watching TV. ? Using your smartphone, tablet, and computer.  Stick to a routine. This can help you fall asleep faster. Try to do a quiet activity, brush your teeth, and go to bed at the same time each night.  Get out of bed if you are still awake after 15 minutes of trying to sleep. Keep the lights down, but try reading or doing a quiet activity. When you feel sleepy, go back to bed.  Make sure that you drive carefully. Avoid driving if you feel very sleepy.  Keep all follow-up appointments as directed by your health care provider. This is important. Contact a health care provider if:  You are tired throughout the day or have trouble in your daily routine due to sleepiness.  You continue to have sleep problems or your sleep problems get worse. Get help right away if:  You have serious thoughts about hurting yourself or someone else. This information is not intended to replace advice given to you by your health care provider. Make sure you discuss any questions you have with your health care provider. Document Released: 11/27/2000 Document Revised: 05/01/2016 Document Reviewed: 08/31/2014 Elsevier Interactive Patient Education  2018 [employer]. Diphenhydramine capsules or tablets [Insomnia] What is this medicine? DIPHENHYDRAMINE (dye fen HYE dra meen) is an antihistamine. This medicine is used to treat occasional sleeplesness. This medicine may be used for other purposes; ask your health care provider or pharmacist if you have questions. COMMON BRAND NAME(S): Aid to Sleep, Compoz Nighttime Sleep Aid, Nighttime Sleep Aid, Nytol, Simply Sleep, Sleep Tabs, Sominex, Unisom, Vicks Qlearquil Nighttime Allergy Relief, Vicks ZzzQuil Nightime Sleep-Aid What should I tell my health care provider before I take this medicine? They need to know if you have any of these conditions: -glaucoma -high blood pressure or heart disease -liver disease -lung or breathing disease, like asthma -pain or trouble passing urine -prostate trouble -ulcers or other stomach problems -an unusual or allergic reaction to diphenhydramine, other medicines foods, dyes, or preservatives such as sulfites -pregnant or trying to get pregnant -breast-feeding How should I use this medicine? Take this medicine by mouth with a full glass of water. Follow the directions on the prescription label. Do not take your medicine more often than directed. Talk to your pediatrician regarding the use of this medicine in children. While this drug may be prescribed for children as young as 70 years old for selected conditions, precautions do apply. Patients over 23 years old may have a stronger reaction and need a smaller dose. Overdosage: If you think you have taken too much of this medicine contact a poison control center or emergency room at once. NOTE: This medicine is only for you. Do not share this medicine with others. What if I miss a dose? If you miss a dose, take it as soon as you can. If it is almost time for your next dose, take only that dose. Do not take double or extra doses. What may interact with this medicine? Do not take this  medicine with any of the following  medications: -MAOIs like Carbex, Eldepryl, Marplan, Nardil, and Parnate This medicine may also interact with the following medications: -alcohol -barbiturates like phenobarbital -medicines for bladder spasm like oxybutynin, tolterodine -medicines for blood pressure -medicines for depression, anxiety, or psychotic disturbances -medicines for movement abnormalities or Parkinson's disease -medicines for sleep -other medicines for cold, cough, or allergy -some medicines for the stomach like chlordiazepoxide, dicyclomine This list may not describe all possible interactions. Give your health care provider a list of all the medicines, herbs, non-prescription drugs, or dietary supplements you use. Also tell them if you smoke, drink alcohol, or use illegal drugs. Some items may interact with your medicine. What should I watch for while using this medicine? Visit your doctor or health care professional for regular check ups. Tell your doctor or health care professional if your symptoms do not start to get better or if they get worse. Your mouth may get dry. Chewing sugarless gum or sucking hard candy, and drinking plenty of water may help. Contact your doctor if the problem does not go away or is severe. This medicine may cause dry eyes and blurred vision. If you wear contact lenses you may feel some discomfort. Lubricating drops may help. See your eye doctor if the problem does not go away or is severe. You may get drowsy or dizzy. Do not drive, use machinery, or do anything that needs mental alertness until you know how this medicine affects you. Do not stand or sit up quickly, especially if you are an older patient. This reduces the risk of dizzy or fainting spells. Alcohol may interfere with the effect of this medicine. Avoid alcoholic drinks. What side effects may I notice from receiving this medicine? Side effects that you should report to your doctor or health care professional as soon as  possible: -allergic reactions like skin rash, itching or hives, swelling of the face, lips, or tongue -changes in vision -confused, agitated, or nervous -fast, irregular heartbeat -tremor -trouble passing urine or change in the amount of urine -unusual bleeding or bruising -unusually weak or tired Side effects that usually do not require medical attention (report to your doctor or health care professional if they continue or are bothersome): -constipation, diarrhea -drowsy -headache -loss of appetite -stomach upset, vomiting -thick mucus This list may not describe all possible side effects. Call your doctor for medical advice about side effects. You may report side effects to FDA at 1-800-FDA-1088. Where should I keep my medicine? Keep out of the reach of children. Store at room temperature between 20 and 25 degrees C (68 and 77 degrees F). Keep container closed tightly. Throw away any unused medicine after the expiration date. NOTE: This sheet is a summary. It may not cover all possible information. If you have questions about this medicine, talk to your doctor, pharmacist, or health care provider.  2018 Elsevier/Gold Standard (2008-03-30 16:14:00)

## 2017-11-01 ENCOUNTER — Encounter: Payer: Self-pay | Admitting: Nurse Practitioner

## 2017-11-02 ENCOUNTER — Other Ambulatory Visit: Payer: Self-pay | Admitting: Internal Medicine

## 2017-11-02 DIAGNOSIS — E118 Type 2 diabetes mellitus with unspecified complications: Secondary | ICD-10-CM

## 2017-11-02 DIAGNOSIS — I10 Essential (primary) hypertension: Secondary | ICD-10-CM

## 2017-11-02 NOTE — Progress Notes (Signed)
[facility]  PATIENT: Valerie [name] DOB: 1946-03-12   REASON FOR VISIT: Follow-up for compliance BiPAP  HISTORY FROM: Patient    HISTORY OF PRESENT ILLNESS:UPDATE 11/21/2018CM [name], 71 year old female returns for follow-up with history of obstructive sleep apnea with BiPAP.  Compliance data reviewed 10/03/2017 to 11/01/2017 with 100% compliance greater than 4 hours for 30 days.  Average usage 10 hours 27 minutes.  Set pressure of 9-13 cm AHI 0.2.  ESS 3.  She returns for reevaluation.  Her equipment company is [employer].  11/02/16 CDMrs. Rosamaria Donn today, who was last seen 35 months ago. The patient brought her compliance data for her BiPAP machine with her and they're excellent she has used the machine 100% of the last 30 days on average 8 hours and 42 minutes, residual AHI is 0.3. The machine is set at 13/9 cm water with breeze function.   The patient is followed by [name].  She feels that her machine makes a significant difference, her Epworth sleepiness score is 3 points while  using BiPAP, she's not excessively daytime fatigue, she endorsed the geriatric depression score at 6/15 points, basically related to her caregiver burden.   Husband had cardiac complaints , suffered from post dialysis chest pain. 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 Emergency stent placed, but now he is unable from the CAD stand point to undergo necessary knee replacement.  Her husband is dependent on hemodialysis, and she feels that it is difficult to take care of herself , when her husband and son need her so much. Her son is a 14 year old quadriparetic.   History:  09-14-2013 [name] is a 71 year old right-handed black female with a history of obesity, and obstructive sleep apnea on CPAP. The patient has report some excessive daytime drowsiness and some problems with memory and concentration. The patient has had no improvement of her memory, but she indicates that  she has become intolerant of the CPAP mask, and she has not been able to use her CPAP machine for about one month. The patient has recently had left foot surgery. MRI done recently showed some minimal small vessel disease. The patient reports that in March of 2014, she had sudden onset of left-sided sensory changes with numbness, and she is also had some discomfort in the same distribution. The patient went to the emergency room, and MRI evaluation of the brain did not show an acute stroke. The patient is not aspirin or Plavix. The patient has some burning sensations of the left side of the face at times.  The patient comes to this office for an evaluation. Sleep Consult note : [name] is a mean of 71 year old African American right-handed lady patient of Dr. Floyde Parkins and Dr. Regis Bill,  [name]  The patient has a past medical history of morbid obesity and sleep apnea for which Dr. Jannifer Franklin referred her.  She reported increasing fatigue and excessive daytime sleepiness .    REVIEW OF SYSTEMS: Full 14 system review of systems performed and notable only for those listed, all others are neg:  Constitutional: neg  Cardiovascular: neg Ear/Nose/Throat: neg  Skin: neg Eyes: neg Respiratory: neg Gastroitestinal: neg  Hematology/Lymphatic: neg  Endocrine: neg Musculoskeletal:neg Allergy/Immunology: neg Neurological: neg Psychiatric: neg Sleep : Obstructive sleep apnea with BiPAP   ALLERGIES: Allergies  Allergen Reactions  . Food Swelling    bananas  . Penicillins Swelling and Rash    HOME MEDICATIONS: Outpatient Medications Prior  to Visit  Medication Sig Dispense Refill  . albuterol (PROVENTIL) (2.5 MG/3ML) 0.083% nebulizer solution     . albuterol (VENTOLIN HFA) 108 (90 BASE) MCG/ACT inhaler Inhale 2 puffs into the lungs every 6 (six) hours as needed. For shortness of breath 3 Inhaler 4  . ALPRAZolam (XANAX) 0.25 MG tablet TAKE ONE TABLET BY MOUTH TWICE DAILY AS NEEDED FOR  ANXIETY 75 tablet 5  . amLODipine (NORVASC) 10 MG tablet TAKE 1 TABLET BY MOUTH ONCE DAILY 90 tablet 1  . aspirin 81 MG tablet Take 81 mg by mouth daily.    [name] atorvastatin (LIPITOR) 20 MG tablet Take 1 tablet (20 mg total) by mouth daily. 90 tablet 3  . carvedilol (COREG) 25 MG tablet TAKE 1 TABLET BY MOUTH TWICE DAILY WITH MEALS 180 tablet 1  . chlorthalidone (HYGROTON) 25 MG tablet TAKE 1 TABLET BY MOUTH ONCE DAILY 90 tablet 1  . ferrous sulfate 325 (65 FE) MG tablet Take 1 tablet (325 mg total) by mouth 2 (two) times daily with a meal. 60 tablet 11  . fluticasone furoate-vilanterol (BREO ELLIPTA) 200-25 MCG/INH AEPB Inhale 1 puff into the lungs daily. 30 each 11  . FREESTYLE TEST STRIPS test strip USE TO TEST BLOOD SUGAR UP TO THREE TIMES DAILY 100 each 11  . gabapentin (NEURONTIN) 300 MG capsule TAKE 1 CAPSULE BY MOUTH THREE TIMES DAILY 270 capsule 1  . Insulin Pen Needle 31G X 5 MM MISC Use once daily with insulin 100 each 3  . irbesartan (AVAPRO) 300 MG tablet Take 1 tablet (300 mg total) by mouth daily. 90 tablet 1  . JANUMET XR [phone] MG TB24 TAKE ONE TABLET BY MOUTH ONCE DAILY 90 tablet 1  . latanoprost (XALATAN) 0.005 % ophthalmic solution Place 1 drop into both eyes at bedtime.    [name] omeprazole (PRILOSEC) 40 MG capsule Take 1 capsule (40 mg total) by mouth daily. 90 capsule 3  . oxyCODONE-acetaminophen (PERCOCET) 7.5-325 MG tablet Take 1 tablet by mouth every 6 (six) hours as needed for severe pain. 90 tablet 0  . potassium chloride SA (K-DUR,KLOR-CON) 20 MEQ tablet Take 1 tablet (20 mEq total) by mouth 2 (two) times daily. 180 tablet 1  . sertraline (ZOLOFT) 100 MG tablet Take 1 tablet (100 mg total) by mouth daily. 90 tablet 3  . TOUJEO SOLOSTAR 300 UNIT/ML SOPN INJECT 30 UNITS SUBCUTANEOUSLY ONCE DAILY 6 pen 11   No facility-administered medications prior to visit.     PAST MEDICAL HISTORY: Past Medical History:  Diagnosis Date  . Anemia   . Anxiety and depression   .  Asthma   . Degenerative arthritis   . Depression   . Diabetes mellitus, type 2 ([place])   . Fatigue   . GERD (gastroesophageal reflux disease)   . Glaucoma   . Hemorrhoids   . Hyperlipidemia   . Hypertension   . Hypothyroid   . Hypoxemia 11/24/2013  . Memory deficit 10/04/2013  . Obesity   . Sleep apnea   . Snoring disorder     PAST SURGICAL HISTORY: Past Surgical History:  Procedure Laterality Date  . benign tumors resected    . CATARACT EXTRACTION Left   . FOOT SURGERY Left    bone spur  . REFRACTIVE SURGERY     Glaucoma  . ROTATOR CUFF REPAIR    . TUBAL LIGATION      FAMILY HISTORY: Family History  Problem Relation Age of Onset  . Alcohol abuse Mother   .  Heart attack Mother   . Coronary artery disease Brother   . Heart attack Father   . Heart disease Sister   . Atrial fibrillation Sister   . Hypertension Sister   . Hypertension Unknown        family history  . Alcohol abuse Unknown     SOCIAL HISTORY: Social History   Socioeconomic History  . Marital status: Married    Spouse name: Not on file  . Number of children: 3  . Years of education: 11th  . Highest education level: Not on file  Social Needs  . Financial resource strain: Not hard at all  . Food insecurity - worry: Never true  . Food insecurity - inability: Never true  . Transportation needs - medical: No  . Transportation needs - non-medical: No  Occupational History  . Occupation: disabled  Tobacco Use  . Smoking status: Never Smoker  . Smokeless tobacco: Never Used  Substance and Sexual Activity  . Alcohol use: No    Alcohol/week: 0.0 oz  . Drug use: No  . Sexual activity: Not Currently    Comment: not working, lives with husband, 3 sons  Other Topics Concern  . Not on file  Social History Narrative   Lives with spouse; spouse disabled and is on dialysis ; also caregiver for son who is 72 yo and is disabled.     PHYSICAL EXAM  Vitals:   11/03/17 1352  BP: 122/71  Pulse: 83    Weight: 267 lb (121.1 kg)  Height: 5\' 6"  (1.676 m)   Body mass index is 43.09 kg/m.  Generalized: Well developed, morbidly obese female in no acute distress  Head: normocephalic and atraumatic,. Oropharynx benign  Neck: Supple, Musculoskeletal: No deformity   Neurological examination   Mentation: Alert oriented to time, place, history taking. Attention span and concentration appropriate. Recent and remote memory intact.  Follows all commands speech and language fluent.   Cranial nerve II-XII: .Pupils were equal round reactive to light extraocular movements were full, visual field were full on confrontational test. Facial sensation and strength were normal. hearing was intact to finger rubbing bilaterally. Uvula tongue midline. head turning and shoulder shrug were normal and symmetric.Tongue protrusion into cheek strength was normal. Motor: normal bulk and tone, full strength in the BUE, BLE,  Sensory: normal and symmetric to light touch,   Coordination: finger-nose-finger, heel-to-shin bilaterally, no dysmetria Reflexes: Depressed upper and lower but symmetric plantar responses were flexor bilaterally. Gait and Station: Rising up from seated position without assistance, normal stance,  moderate stride, good arm swing, smooth turning, able to perform tiptoe, and heel walking without difficulty. Tandem gait is steady.  No assistive device  DIAGNOSTIC DATA (LABS, IMAGING, TESTING) - I reviewed patient records, labs, notes, testing and imaging myself where available.  Lab Results  Component Value Date   WBC 12.3 (H) 10/06/2017   HGB 11.6 (L) 10/06/2017   HCT 36.2 10/06/2017   MCV 88.6 10/06/2017   PLT 388.0 10/06/2017      Component Value Date/Time   NA 136 10/06/2017 1718   K 3.9 10/06/2017 1718   CL 96 10/06/2017 1718   CO2 29 10/06/2017 1718   GLUCOSE 122 (H) 10/06/2017 1718   BUN 16 10/06/2017 1718   CREATININE 0.91 10/06/2017 1718   CALCIUM 10.0 10/06/2017 1718   PROT  7.3 03/24/2017 1624   ALBUMIN 4.2 03/24/2017 1624   AST 31 03/24/2017 1624   ALT 30 03/24/2017 1624   ALKPHOS 72  03/24/2017 1624   BILITOT 0.4 03/24/2017 1624   GFRNONAA >60 08/29/2015 0630   GFRAA >60 08/29/2015 0630   Lab Results  Component Value Date   CHOL 135 10/06/2017   HDL 46.30 10/06/2017   LDLCALC 43 03/28/2014   LDLDIRECT 55.0 10/06/2017   TRIG 261.0 (H) 10/06/2017   CHOLHDL 3 10/06/2017   Lab Results  Component Value Date   HGBA1C 7.6 08/02/2017    Lab Results  Component Value Date   TSH 3.62 10/06/2017      ASSESSMENT AND PLAN  71 y.o. year old female  has a past medical history of Obesity, Sleep apnea, and Snoring disorder. here to follow-up for her sleep apnea.Compliance data reviewed 10/03/2017 to 11/01/2017 with 100% compliance greater than 4 hours for 30 days.  Average usage 10 hours 27 minutes.  Set pressure of 9-13 cm AHI 0.2.  ESS 3.    Patient is 100% compliant with her BiPAP reviewed results with patient Continue same settings Follow-up yearly and as needed Dennie Bible, [facility], [facility], APRN  [facility] [address] [place], [place] [address] [phone]

## 2017-11-03 ENCOUNTER — Ambulatory Visit: Payer: PPO | Admitting: Nurse Practitioner

## 2017-11-03 ENCOUNTER — Encounter: Payer: Self-pay | Admitting: Nurse Practitioner

## 2017-11-03 DIAGNOSIS — G4733 Obstructive sleep apnea (adult) (pediatric): Secondary | ICD-10-CM | POA: Diagnosis not present

## 2017-11-03 NOTE — Patient Instructions (Signed)
Patient is 100% compliant with her BiPAP Follow-up yearly and as needed

## 2017-11-22 DIAGNOSIS — G4733 Obstructive sleep apnea (adult) (pediatric): Secondary | ICD-10-CM | POA: Diagnosis not present

## 2017-11-24 DIAGNOSIS — M25561 Pain in right knee: Secondary | ICD-10-CM | POA: Diagnosis not present

## 2017-11-24 DIAGNOSIS — M545 Low back pain: Secondary | ICD-10-CM | POA: Diagnosis not present

## 2017-11-30 ENCOUNTER — Encounter: Payer: Self-pay | Admitting: Internal Medicine

## 2017-12-01 ENCOUNTER — Ambulatory Visit: Payer: Self-pay | Admitting: *Deleted

## 2017-12-01 NOTE — Telephone Encounter (Signed)
Pt rechecked B/P, currently 112/51; Pt denies SOB, difficulty breathing, no nausea, dizziness resolving (sitting). Eating and drinking ok without issues. Reason for Disposition . Brief (now gone) dizziness or lightheadedness after standing up or eating  Answer Assessment - Initial Assessment Questions 1. BLOOD PRESSURE: "What is the blood pressure?" "Did you take at least two measurements 5 minutes apart?"     90/49 2. ONSET: "When did you take your blood pressure?"    This morning 3. HOW: "How did you obtain the blood pressure?" (e.g., visiting nurse, automatic home BP monitor)     Home monitor 4. HISTORY: "Do you have a history of low blood pressure?" "What is your blood pressure normally?"     no 5. MEDICATIONS: "Are you taking any medications for blood pressure?" If yes: "Have they been changed recently?"     Yes  6. PULSE RATE: "Do you know what your pulse rate is?"      *No Answer* 7. OTHER SYMPTOMS: "Have you been sick recently?" "Have you had a recent injury?"     No  Protocols used: LOW BLOOD PRESSURE-A-AH

## 2017-12-03 ENCOUNTER — Ambulatory Visit: Payer: PPO | Admitting: Family

## 2017-12-05 ENCOUNTER — Other Ambulatory Visit: Payer: Self-pay | Admitting: Internal Medicine

## 2017-12-05 DIAGNOSIS — E118 Type 2 diabetes mellitus with unspecified complications: Secondary | ICD-10-CM

## 2017-12-15 ENCOUNTER — Ambulatory Visit (INDEPENDENT_AMBULATORY_CARE_PROVIDER_SITE_OTHER): Payer: PPO | Admitting: Internal Medicine

## 2017-12-15 ENCOUNTER — Encounter: Payer: Self-pay | Admitting: Internal Medicine

## 2017-12-15 ENCOUNTER — Other Ambulatory Visit (INDEPENDENT_AMBULATORY_CARE_PROVIDER_SITE_OTHER): Payer: PPO

## 2017-12-15 DIAGNOSIS — K921 Melena: Secondary | ICD-10-CM | POA: Diagnosis not present

## 2017-12-15 DIAGNOSIS — E039 Hypothyroidism, unspecified: Secondary | ICD-10-CM

## 2017-12-15 DIAGNOSIS — I10 Essential (primary) hypertension: Secondary | ICD-10-CM

## 2017-12-15 DIAGNOSIS — D500 Iron deficiency anemia secondary to blood loss (chronic): Secondary | ICD-10-CM | POA: Diagnosis not present

## 2017-12-15 DIAGNOSIS — Z794 Long term (current) use of insulin: Secondary | ICD-10-CM | POA: Diagnosis not present

## 2017-12-15 DIAGNOSIS — E118 Type 2 diabetes mellitus with unspecified complications: Secondary | ICD-10-CM

## 2017-12-15 DIAGNOSIS — R079 Chest pain, unspecified: Secondary | ICD-10-CM

## 2017-12-15 DIAGNOSIS — M48061 Spinal stenosis, lumbar region without neurogenic claudication: Secondary | ICD-10-CM | POA: Diagnosis not present

## 2017-12-15 LAB — TSH: TSH: 1.95 u[IU]/mL (ref 0.35–4.50)

## 2017-12-15 LAB — BASIC METABOLIC PANEL
BUN: 20 mg/dL (ref 6–23)
CO2: 26 mEq/L (ref 19–32)
Calcium: 9.6 mg/dL (ref 8.4–10.5)
Chloride: 97 mEq/L (ref 96–112)
Creatinine, Ser: 0.94 mg/dL (ref 0.40–1.20)
GFR: 75.76 mL/min (ref >60.00)
Glucose, Bld: 116 mg/dL — ABNORMAL HIGH (ref 70–99)
Potassium: 3.6 mEq/L (ref 3.5–5.1)
Sodium: 137 mEq/L (ref 135–145)

## 2017-12-15 LAB — CBC WITH DIFFERENTIAL/PLATELET
Basophils Absolute: 0.1 10*3/uL (ref 0.0–0.1)
Basophils Relative: 1 % (ref 0.0–3.0)
Eosinophils Absolute: 0.2 10*3/uL (ref 0.0–0.7)
Eosinophils Relative: 1.8 % (ref 0.0–5.0)
HCT: 33.4 % — ABNORMAL LOW (ref 36.0–46.0)
Hemoglobin: 10.5 g/dL — ABNORMAL LOW (ref 12.0–15.0)
Lymphocytes Relative: 26.7 % (ref 12.0–46.0)
Lymphs Abs: 3.3 10*3/uL (ref 0.7–4.0)
MCHC: 31.3 g/dL (ref 30.0–36.0)
MCV: 91.2 fl (ref 78.0–100.0)
Monocytes Absolute: 1 10*3/uL (ref 0.1–1.0)
Monocytes Relative: 8.2 % (ref 3.0–12.0)
Neutro Abs: 7.6 10*3/uL (ref 1.4–7.7)
Neutrophils Relative %: 62.3 % (ref 43.0–77.0)
Platelets: 375 10*3/uL (ref 150.0–400.0)
RBC: 3.66 Mil/uL — ABNORMAL LOW (ref 3.87–5.11)
RDW: 15.9 % — ABNORMAL HIGH (ref 11.5–15.5)
WBC: 12.2 10*3/uL — ABNORMAL HIGH (ref 4.0–10.5)

## 2017-12-15 LAB — BRAIN NATRIURETIC PEPTIDE: Pro B Natriuretic peptide (BNP): 11 pg/mL (ref 0.0–100.0)

## 2017-12-15 MED ORDER — OXYCODONE-ACETAMINOPHEN 7.5-325 MG PO TABS: 1.0000 | ORAL_TABLET | Freq: Four times a day (QID) | ORAL | 0 refills | Status: DC | PRN

## 2017-12-15 MED ORDER — SITAGLIP PHOS-METFORMIN HCL ER 100-1000 MG PO TB24: 1.0000 | ORAL_TABLET | Freq: Every day | ORAL | 1 refills | Status: DC

## 2017-12-15 NOTE — Progress Notes (Signed)
Subjective:  Patient ID: Valerie [name], female    DOB: January 14, 1946  Age: 72 y.o. MRN: [id number]  CC: Hypertension; Back Pain; and Diabetes   HPI Valerie [name] presents for f/up -she continues to complain of intermittent stabbing pain under her sternum that occurs at rest.  She is not very active but when she does exert herself she does not experience DOE, SOB, diaphoresis, lightheadedness, or fatigue.  The chest pain is not worsened with activity.  She also complains of chronic, nonradiating low back pain and wants a refill on Percocet.  Outpatient Medications Prior to Visit  Medication Sig Dispense Refill  . albuterol (PROVENTIL) (2.5 MG/3ML) 0.083% nebulizer solution     . albuterol (VENTOLIN HFA) 108 (90 BASE) MCG/ACT inhaler Inhale 2 puffs into the lungs every 6 (six) hours as needed. For shortness of breath 3 Inhaler 4  . ALPRAZolam (XANAX) 0.25 MG tablet TAKE ONE TABLET BY MOUTH TWICE DAILY AS NEEDED FOR ANXIETY 75 tablet 5  . amLODipine (NORVASC) 10 MG tablet TAKE 1 TABLET BY MOUTH ONCE DAILY 90 tablet 1  . aspirin 81 MG tablet Take 81 mg by mouth daily.    [name] atorvastatin (LIPITOR) 20 MG tablet Take 1 tablet (20 mg total) by mouth daily. 90 tablet 3  . carvedilol (COREG) 25 MG tablet TAKE 1 TABLET BY MOUTH TWICE DAILY WITH MEALS 180 tablet 1  . chlorthalidone (HYGROTON) 25 MG tablet TAKE 1 TABLET BY MOUTH ONCE DAILY 90 tablet 1  . ferrous sulfate 325 (65 FE) MG tablet Take 1 tablet (325 mg total) by mouth 2 (two) times daily with a meal. 60 tablet 11  . fluticasone furoate-vilanterol (BREO ELLIPTA) 200-25 MCG/INH AEPB Inhale 1 puff into the lungs daily. 30 each 11  . FREESTYLE TEST STRIPS test strip USE TO TEST BLOOD SUGAR UP TO 3 TIMES A DAY 100 each 11  . gabapentin (NEURONTIN) 300 MG capsule TAKE 1 CAPSULE BY MOUTH THREE TIMES DAILY 270 capsule 1  . Insulin Pen Needle 31G X 5 MM MISC Use once daily with insulin 100 each 3  . irbesartan (AVAPRO) 300 MG tablet Take 1 tablet  (300 mg total) by mouth daily. 90 tablet 1  . latanoprost (XALATAN) 0.005 % ophthalmic solution Place 1 drop into both eyes at bedtime.    [name] omeprazole (PRILOSEC) 40 MG capsule Take 1 capsule (40 mg total) by mouth daily. 90 capsule 3  . potassium chloride SA (K-DUR,KLOR-CON) 20 MEQ tablet Take 1 tablet (20 mEq total) by mouth 2 (two) times daily. 180 tablet 1  . sertraline (ZOLOFT) 100 MG tablet Take 1 tablet (100 mg total) by mouth daily. 90 tablet 3  . TOUJEO SOLOSTAR 300 UNIT/ML SOPN INJECT 30 UNITS SUBCUTANEOUSLY ONCE DAILY 6 pen 11  . JANUMET XR [phone] MG TB24 TAKE ONE TABLET BY MOUTH ONCE DAILY 90 tablet 1  . oxyCODONE-acetaminophen (PERCOCET) 7.5-325 MG tablet Take 1 tablet by mouth every 6 (six) hours as needed for severe pain. 90 tablet 0   No facility-administered medications prior to visit.     ROS Review of Systems  Constitutional: Negative for diaphoresis and fatigue.  HENT: Negative.   Eyes: Negative for visual disturbance.  Respiratory: Positive for apnea. Negative for chest tightness, shortness of breath and wheezing.   Cardiovascular: Positive for chest pain. Negative for palpitations and leg swelling.  Gastrointestinal: Negative for abdominal pain, constipation, diarrhea, nausea and vomiting.  Endocrine: Negative.  Negative for polydipsia, polyphagia and polyuria.  Genitourinary:  Negative.   Musculoskeletal: Positive for back pain. Negative for myalgias and neck pain.  Skin: Negative for color change and rash.  Allergic/Immunologic: Negative.   Neurological: Negative.  Negative for dizziness, weakness, light-headedness, numbness and headaches.  Hematological: Negative for adenopathy. Does not bruise/bleed easily.  Psychiatric/Behavioral: Positive for dysphoric mood and sleep disturbance. Negative for decreased concentration, self-injury and suicidal ideas. The patient is nervous/anxious.     Objective:  There were no vitals taken for this visit.  BP Readings from  Last 3 Encounters:  11/03/17 122/71  10/28/17 118/60  10/06/17 138/64    Wt Readings from Last 3 Encounters:  11/03/17 267 lb (121.1 kg)  10/28/17 269 lb (122 kg)  10/06/17 263 lb (119.3 kg)    Physical Exam  Constitutional: She is oriented to person, place, and time. No distress.  HENT:  Mouth/Throat: Oropharynx is clear and moist. No oropharyngeal exudate.  Eyes: Conjunctivae are normal. Left eye exhibits no discharge. No scleral icterus.  Neck: Normal range of motion. Neck supple. No JVD present. No thyromegaly present.  Cardiovascular: Normal rate, regular rhythm and normal heart sounds.  No murmur heard. EKG---  Sinus  Rhythm  WITHIN NORMAL LIMITS - no change from the prior EKG  Pulmonary/Chest: Effort normal and breath sounds normal. No respiratory distress. She has no wheezes. She has no rales.  Abdominal: Soft. Bowel sounds are normal. She exhibits no distension and no mass. There is no rebound and no guarding.  Musculoskeletal: Normal range of motion. She exhibits no edema or tenderness.  Lymphadenopathy:    She has no cervical adenopathy.  Neurological: She is alert and oriented to person, place, and time.  Neg SLR in BLE  Skin: Skin is warm and dry. No rash noted. She is not diaphoretic. No erythema. No pallor.  Vitals reviewed.   Lab Results  Component Value Date   WBC 12.2 (H) 12/15/2017   HGB 10.5 (L) 12/15/2017   HCT 33.4 (L) 12/15/2017   PLT 375.0 12/15/2017   GLUCOSE 116 (H) 12/15/2017   CHOL 135 10/06/2017   TRIG 261.0 (H) 10/06/2017   HDL 46.30 10/06/2017   LDLDIRECT 55.0 10/06/2017   LDLCALC 43 03/28/2014   ALT 30 03/24/2017   AST 31 03/24/2017   NA 137 12/15/2017   K 3.6 12/15/2017   CL 97 12/15/2017   CREATININE 0.94 12/15/2017   BUN 20 12/15/2017   CO2 26 12/15/2017   TSH 1.95 12/15/2017   INR 1.06 08/29/2015   HGBA1C 7.6 08/02/2017   MICROALBUR <0.7 03/24/2017    Dg Chest 2 View  Result Date: 04/16/2016 CLINICAL DATA:  Recent  diagnosis of pneumonia. Coughing and wheezing and shortness of breath for 2 weeks. History of hypertension. EXAM: CHEST  2 VIEW COMPARISON:  08/29/2015 FINDINGS: Cardiac silhouette is top-normal in size. No mediastinal or hilar masses or evidence of adenopathy. Clear lungs.  No pleural effusion or pneumothorax. Bony thorax is intact. IMPRESSION: No active cardiopulmonary disease. Electronically Signed   By: Lajean Manes M.D.   On: 04/16/2016 15:36    Assessment & Plan:   Klaryssa was seen today for hypertension, back pain and diabetes.  Diagnoses and all orders for this visit:  Chest pain, unspecified type- She has previously seen cardiology for an ischemia workup which was unremarkable.  Her chest pain is chronic and recurrent.  Her symptoms today do not sound like ischemia.  Her EKG remains normal.  Screening for fluid overload and pulmonary embolus is negative.  I offered  her reassurance. -     EKG 12-Lead -     Brain natriuretic peptide; Future -     D-dimer, quantitative (not at [facility]); Future  Iron deficiency anemia due to chronic blood loss- Her anemia is a little worse than before.  I have asked her to be compliant with the iron replacement therapy. Will refer to GI to consider endoscopies to look for sources of blood loss. -     CBC with Differential/Platelet; Future  Type 2 diabetes mellitus with complication, without long-term current use of insulin ([place])- Her A1c is at 7.6%.  Will continue the current combination of metformin and a DPP4 inhibitor. -     Basic metabolic panel; Future -     SitaGLIPtin-MetFORMIN HCl (JANUMET XR) [phone] MG TB24; Take 1 tablet by mouth daily.  Essential hypertension, benign- Her blood pressure is well controlled.  Electrolytes and renal function are normal. -     Basic metabolic panel; Future  Acquired hypothyroidism-Her TSH is in the normal range.  She will continue the current dose of levothyroxine. 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  Type 2 diabetes mellitus with complication, with long-term current use of insulin ([place])   I have changed Kilah A. Puzio's JANUMET XR to SitaGLIPtin-MetFORMIN HCl. I am also having her maintain her latanoprost, albuterol, aspirin, Insulin Pen Needle, TOUJEO SOLOSTAR, albuterol, fluticasone furoate-vilanterol, ferrous sulfate, sertraline, omeprazole, atorvastatin, potassium chloride SA, amLODipine, ALPRAZolam, irbesartan, carvedilol, chlorthalidone, gabapentin, FREESTYLE TEST STRIPS, and oxyCODONE-acetaminophen.  Meds ordered this encounter  Medications  . oxyCODONE-acetaminophen (PERCOCET) 7.5-325 MG tablet    Sig: Take 1 tablet by mouth every 6 (six) hours as needed for severe pain.    Dispense:  90 tablet    Refill:  0  . SitaGLIPtin-MetFORMIN HCl (JANUMET XR) [phone] MG TB24    Sig: Take 1 tablet by mouth daily.    Dispense:  90 tablet    Refill:  1     Follow-up: Return in about 4 months (around 04/14/2018).  [name], MD

## 2017-12-15 NOTE — Patient Instructions (Addendum)

## 2017-12-16 ENCOUNTER — Encounter: Payer: Self-pay | Admitting: Internal Medicine

## 2017-12-16 LAB — D-DIMER, QUANTITATIVE: D-Dimer, Quant: 0.53 mcg/mL FEU — ABNORMAL HIGH (ref <0.50)

## 2017-12-20 NOTE — Addendum Note (Signed)
Addended by: Janith Lima on: 12/20/2017 07:40 AM   Modules accepted: Orders

## 2018-01-05 DIAGNOSIS — Z800 Family history of malignant neoplasm of digestive organs: Secondary | ICD-10-CM | POA: Diagnosis not present

## 2018-01-05 DIAGNOSIS — K219 Gastro-esophageal reflux disease without esophagitis: Secondary | ICD-10-CM | POA: Diagnosis not present

## 2018-01-05 DIAGNOSIS — K625 Hemorrhage of anus and rectum: Secondary | ICD-10-CM | POA: Diagnosis not present

## 2018-01-05 DIAGNOSIS — D509 Iron deficiency anemia, unspecified: Secondary | ICD-10-CM | POA: Diagnosis not present

## 2018-02-08 ENCOUNTER — Other Ambulatory Visit: Payer: Self-pay | Admitting: Internal Medicine

## 2018-02-08 DIAGNOSIS — E118 Type 2 diabetes mellitus with unspecified complications: Secondary | ICD-10-CM

## 2018-02-11 DIAGNOSIS — K921 Melena: Secondary | ICD-10-CM | POA: Diagnosis not present

## 2018-02-11 DIAGNOSIS — D509 Iron deficiency anemia, unspecified: Secondary | ICD-10-CM | POA: Diagnosis not present

## 2018-02-11 DIAGNOSIS — K625 Hemorrhage of anus and rectum: Secondary | ICD-10-CM | POA: Diagnosis not present

## 2018-02-11 DIAGNOSIS — D123 Benign neoplasm of transverse colon: Secondary | ICD-10-CM | POA: Diagnosis not present

## 2018-02-11 DIAGNOSIS — K635 Polyp of colon: Secondary | ICD-10-CM | POA: Diagnosis not present

## 2018-02-11 DIAGNOSIS — K5730 Diverticulosis of large intestine without perforation or abscess without bleeding: Secondary | ICD-10-CM | POA: Diagnosis not present

## 2018-02-11 LAB — HM COLONOSCOPY

## 2018-02-22 ENCOUNTER — Encounter: Payer: Self-pay | Admitting: Internal Medicine

## 2018-02-22 NOTE — Progress Notes (Signed)
Result abstracted and sent to scan ° °

## 2018-03-04 DIAGNOSIS — R490 Dysphonia: Secondary | ICD-10-CM | POA: Diagnosis not present

## 2018-03-04 DIAGNOSIS — J029 Acute pharyngitis, unspecified: Secondary | ICD-10-CM | POA: Diagnosis not present

## 2018-03-14 DIAGNOSIS — K625 Hemorrhage of anus and rectum: Secondary | ICD-10-CM | POA: Diagnosis not present

## 2018-03-14 DIAGNOSIS — D509 Iron deficiency anemia, unspecified: Secondary | ICD-10-CM | POA: Diagnosis not present

## 2018-03-18 ENCOUNTER — Other Ambulatory Visit: Payer: Self-pay | Admitting: Internal Medicine

## 2018-03-23 ENCOUNTER — Ambulatory Visit: Payer: Self-pay | Admitting: *Deleted

## 2018-03-23 NOTE — Telephone Encounter (Signed)
Changing protocol to systolic<90 and dizzy Patient called with low b/p and dizziness. 90/48 and 85/42 bpm 83. She also stated she felt like her heart was fluttering earlier. I noted SOB during our conversation. She denies chest pain/chest pressure.  She will have her sister drive her to [facility] ED. Reason for Disposition . [2] Systolic BP < 90 AND [4] dizzy, lightheaded, or weak  Answer Assessment - Initial Assessment Questions 1. BLOOD PRESSURE: "What is the blood pressure?" "Did you take at least two measurements 5 minutes apart?"     90/48 and 85/42 2. ONSET: "When did you take your blood pressure?"     Around 3:00 and 4:ish. 3. HOW: "How did you obtain the blood pressure?" (e.g., visiting nurse, automatic home BP monitor)    Home b/p 4. HISTORY: "Do you have a history of low blood pressure?" "What is your blood pressure normally?"    Yes 5. MEDICATIONS: "Are you taking any medications for blood pressure?" If yes: "Have they been changed recently?" Yes, taking medication and it hasn't been changed recently 6. PULSE RATE: "Do you know what your pulse rate is?"   83 bpm 7. OTHER SYMPTOMS: "Have you been sick recently?" "Have you had a recent injury?"    no 8. PREGNANCY: "Is there any chance you are pregnant?" "When was your last menstrual period?"   no  Protocols used: LOW BLOOD PRESSURE-A-AH

## 2018-03-24 ENCOUNTER — Other Ambulatory Visit (INDEPENDENT_AMBULATORY_CARE_PROVIDER_SITE_OTHER): Payer: PPO

## 2018-03-24 ENCOUNTER — Encounter: Payer: Self-pay | Admitting: Internal Medicine

## 2018-03-24 ENCOUNTER — Ambulatory Visit: Payer: PPO | Admitting: Internal Medicine

## 2018-03-24 ENCOUNTER — Ambulatory Visit (INDEPENDENT_AMBULATORY_CARE_PROVIDER_SITE_OTHER): Payer: PPO | Admitting: Internal Medicine

## 2018-03-24 VITALS — BP 134/68 | HR 95 | Temp 98.5°F | Ht 66.0 in | Wt 265.1 lb

## 2018-03-24 DIAGNOSIS — Z794 Long term (current) use of insulin: Secondary | ICD-10-CM | POA: Diagnosis not present

## 2018-03-24 DIAGNOSIS — D72829 Elevated white blood cell count, unspecified: Secondary | ICD-10-CM

## 2018-03-24 DIAGNOSIS — K644 Residual hemorrhoidal skin tags: Secondary | ICD-10-CM | POA: Diagnosis not present

## 2018-03-24 DIAGNOSIS — K760 Fatty (change of) liver, not elsewhere classified: Secondary | ICD-10-CM

## 2018-03-24 DIAGNOSIS — E039 Hypothyroidism, unspecified: Secondary | ICD-10-CM

## 2018-03-24 DIAGNOSIS — D500 Iron deficiency anemia secondary to blood loss (chronic): Secondary | ICD-10-CM

## 2018-03-24 DIAGNOSIS — D539 Nutritional anemia, unspecified: Secondary | ICD-10-CM | POA: Insufficient documentation

## 2018-03-24 DIAGNOSIS — I10 Essential (primary) hypertension: Secondary | ICD-10-CM | POA: Diagnosis not present

## 2018-03-24 DIAGNOSIS — I889 Nonspecific lymphadenitis, unspecified: Secondary | ICD-10-CM | POA: Diagnosis not present

## 2018-03-24 DIAGNOSIS — D509 Iron deficiency anemia, unspecified: Secondary | ICD-10-CM

## 2018-03-24 DIAGNOSIS — E876 Hypokalemia: Secondary | ICD-10-CM

## 2018-03-24 DIAGNOSIS — E118 Type 2 diabetes mellitus with unspecified complications: Secondary | ICD-10-CM

## 2018-03-24 LAB — CBC WITH DIFFERENTIAL/PLATELET
Basophils Absolute: 0.1 10*3/uL (ref 0.0–0.1)
Basophils Relative: 0.5 % (ref 0.0–3.0)
Eosinophils Absolute: 0.4 10*3/uL (ref 0.0–0.7)
Eosinophils Relative: 3 % (ref 0.0–5.0)
HCT: 30.9 % — ABNORMAL LOW (ref 36.0–46.0)
Hemoglobin: 10.2 g/dL — ABNORMAL LOW (ref 12.0–15.0)
Lymphocytes Relative: 19.1 % (ref 12.0–46.0)
Lymphs Abs: 2.3 10*3/uL (ref 0.7–4.0)
MCHC: 32.8 g/dL (ref 30.0–36.0)
MCV: 86.7 fl (ref 78.0–100.0)
Monocytes Absolute: 1 10*3/uL (ref 0.1–1.0)
Monocytes Relative: 8.4 % (ref 3.0–12.0)
Neutro Abs: 8.2 10*3/uL — ABNORMAL HIGH (ref 1.4–7.7)
Neutrophils Relative %: 69 % (ref 43.0–77.0)
Platelets: 336 10*3/uL (ref 150.0–400.0)
RBC: 3.57 Mil/uL — ABNORMAL LOW (ref 3.87–5.11)
RDW: 15.9 % — ABNORMAL HIGH (ref 11.5–15.5)
WBC: 11.8 10*3/uL — ABNORMAL HIGH (ref 4.0–10.5)

## 2018-03-24 LAB — COMPREHENSIVE METABOLIC PANEL
ALT: 22 U/L (ref 0–35)
AST: 19 U/L (ref 0–37)
Albumin: 4.1 g/dL (ref 3.5–5.2)
Alkaline Phosphatase: 89 U/L (ref 39–117)
BUN: 19 mg/dL (ref 6–23)
CO2: 31 mEq/L (ref 19–32)
Calcium: 9.7 mg/dL (ref 8.4–10.5)
Chloride: 97 mEq/L (ref 96–112)
Creatinine, Ser: 0.97 mg/dL (ref 0.40–1.20)
GFR: 73 mL/min (ref >60.00)
Glucose, Bld: 239 mg/dL — ABNORMAL HIGH (ref 70–99)
Potassium: 3.2 mEq/L — ABNORMAL LOW (ref 3.5–5.1)
Sodium: 137 mEq/L (ref 135–145)
Total Bilirubin: 0.4 mg/dL (ref 0.2–1.2)
Total Protein: 7.4 g/dL (ref 6.0–8.3)

## 2018-03-24 LAB — FERRITIN: Ferritin: 33 ng/mL (ref 10.0–291.0)

## 2018-03-24 LAB — IBC PANEL
Iron: 35 ug/dL — ABNORMAL LOW (ref 42–145)
Saturation Ratios: 8.8 % — ABNORMAL LOW (ref 20.0–50.0)
Transferrin: 284 mg/dL (ref 212.0–360.0)

## 2018-03-24 LAB — HEMOGLOBIN A1C: Hgb A1c MFr Bld: 7.7 % — ABNORMAL HIGH (ref 4.6–6.5)

## 2018-03-24 MED ORDER — CEFDINIR 300 MG PO CAPS: 300.0000 mg | ORAL_CAPSULE | Freq: Two times a day (BID) | ORAL | 0 refills | Status: AC

## 2018-03-24 NOTE — Progress Notes (Signed)
Subjective:  Patient ID: Valerie [name], female    DOB: 1946/09/30  Age: 72 y.o. MRN: [id number]  CC: Anemia   HPI HENDEL GATLIFF presents for f/up - she continues to intermittently pass blood through her rectum.  She recently saw a gastroenterologist and had a lower endoscopy performed which she tells me revealed bleeding external anal hemorrhoids.  She tells me that she was told to see a general surgeon about this but she has not done that yet.  She also has intermittent episodes of epistaxis and tells me she was recently seen by an ENT doctor and nothing serious was found.  She is not currently taking her iron supplement.  She also complains of a one-week history of a painful area in front of her right ear.  She has not noticed a rash, fever, chills, or headache.  Outpatient Medications Prior to Visit  Medication Sig Dispense Refill  . albuterol (PROVENTIL) (2.5 MG/3ML) 0.083% nebulizer solution     . albuterol (VENTOLIN HFA) 108 (90 BASE) MCG/ACT inhaler Inhale 2 puffs into the lungs every 6 (six) hours as needed. For shortness of breath 3 Inhaler 4  . ALPRAZolam (XANAX) 0.25 MG tablet TAKE ONE TABLET BY MOUTH TWICE DAILY AS NEEDED FOR ANXIETY 75 tablet 5  . amLODipine (NORVASC) 10 MG tablet TAKE 1 TABLET BY MOUTH ONCE DAILY 90 tablet 1  . aspirin 81 MG tablet Take 81 mg by mouth daily.    [name] atorvastatin (LIPITOR) 20 MG tablet Take 1 tablet (20 mg total) by mouth daily. 90 tablet 3  . carvedilol (COREG) 25 MG tablet TAKE 1 TABLET BY MOUTH TWICE DAILY WITH MEALS 180 tablet 1  . chlorthalidone (HYGROTON) 25 MG tablet TAKE 1 TABLET BY MOUTH ONCE DAILY 90 tablet 1  . fluticasone furoate-vilanterol (BREO ELLIPTA) 200-25 MCG/INH AEPB Inhale 1 puff into the lungs daily. 30 each 11  . FREESTYLE TEST STRIPS test strip USE TO TEST BLOOD SUGAR UP TO 3 TIMES A DAY 100 each 11  . FREESTYLE TEST STRIPS test strip USE TO TEST BLOOD SUGAR UP TO 3 TIMES A DAY 100 each 11  . gabapentin (NEURONTIN) 300  MG capsule TAKE 1 CAPSULE BY MOUTH THREE TIMES DAILY 270 capsule 1  . Insulin Pen Needle 31G X 5 MM MISC Use once daily with insulin 100 each 3  . irbesartan (AVAPRO) 300 MG tablet Take 1 tablet (300 mg total) by mouth daily. 90 tablet 1  . latanoprost (XALATAN) 0.005 % ophthalmic solution Place 1 drop into both eyes at bedtime.    [name] omeprazole (PRILOSEC) 40 MG capsule Take 1 capsule (40 mg total) by mouth daily. 90 capsule 3  . oxyCODONE-acetaminophen (PERCOCET) 7.5-325 MG tablet Take 1 tablet by mouth every 6 (six) hours as needed for severe pain. 90 tablet 0  . potassium chloride SA (K-DUR,KLOR-CON) 20 MEQ tablet Take 1 tablet (20 mEq total) by mouth 2 (two) times daily. 180 tablet 1  . sertraline (ZOLOFT) 100 MG tablet Take 1 tablet (100 mg total) by mouth daily. 90 tablet 3  . SitaGLIPtin-MetFORMIN HCl (JANUMET XR) [phone] MG TB24 Take 1 tablet by mouth daily. 90 tablet 1  . TOUJEO SOLOSTAR 300 UNIT/ML SOPN INJECT 30 UNITS SUBCUTANEOUSLY ONCE DAILY 6 mL 5  . ferrous sulfate 325 (65 FE) MG tablet Take 1 tablet (325 mg total) by mouth 2 (two) times daily with a meal. 60 tablet 11   No facility-administered medications prior to visit.  ROS Review of Systems  Constitutional: Positive for fatigue. Negative for appetite change and diaphoresis.  HENT: Positive for nosebleeds. Negative for congestion, facial swelling, postnasal drip, sinus pressure, sore throat and voice change.   Eyes: Negative for visual disturbance.  Respiratory: Negative.  Negative for cough, chest tightness, shortness of breath and wheezing.   Cardiovascular: Negative for chest pain, palpitations and leg swelling.  Gastrointestinal: Positive for anal bleeding and blood in stool. Negative for abdominal distention, abdominal pain, constipation, diarrhea, nausea and vomiting.  Endocrine: Negative.   Genitourinary: Negative.  Negative for difficulty urinating, vaginal bleeding and vaginal discharge.  Musculoskeletal:  Negative.  Negative for back pain and myalgias.  Skin: Negative.  Negative for color change and pallor.  Allergic/Immunologic: Negative.   Neurological: Negative.  Negative for dizziness, weakness, light-headedness and headaches.  Hematological: Negative for adenopathy. Does not bruise/bleed easily.  Psychiatric/Behavioral: Negative.     Objective:  BP 134/68 (BP Location: Left Arm, Patient Position: Sitting, Cuff Size: Normal)   Pulse 95   Temp 98.5 F (36.9 C)   Ht 5\' 6"  (1.676 m)   Wt 265 lb 1.9 oz (120.3 kg)   SpO2 98%   BMI 42.79 kg/m   BP Readings from Last 3 Encounters:  03/24/18 134/68  11/03/17 122/71  10/28/17 118/60    Wt Readings from Last 3 Encounters:  03/24/18 265 lb 1.9 oz (120.3 kg)  11/03/17 267 lb (121.1 kg)  10/28/17 269 lb (122 kg)    Physical Exam  Constitutional: She is oriented to person, place, and time. No distress.  HENT:  Head:    Mouth/Throat: Oropharynx is clear and moist. No oropharyngeal exudate.  Eyes: Conjunctivae are normal. No scleral icterus.  Neck: Normal range of motion. Neck supple. No JVD present. No thyromegaly present.  Cardiovascular: Normal rate, regular rhythm, normal heart sounds and intact distal pulses. Exam reveals no gallop and no friction rub.  No murmur heard. Pulmonary/Chest: Effort normal and breath sounds normal. No stridor. No respiratory distress. She has no wheezes. She has no rales. She exhibits no tenderness.  Abdominal: Soft. Bowel sounds are normal. She exhibits no distension and no mass. There is no tenderness. There is no rebound and no guarding. No hernia.  Musculoskeletal: Normal range of motion. She exhibits no edema, tenderness or deformity.  Lymphadenopathy:    She has no cervical adenopathy.  Neurological: She is alert and oriented to person, place, and time.  Skin: Skin is warm and dry. She is not diaphoretic.  Psychiatric: She has a normal mood and affect. Her behavior is normal. Judgment and  thought content normal.  Vitals reviewed.   Lab Results  Component Value Date   WBC 11.8 (H) 03/24/2018   HGB 10.2 (L) 03/24/2018   HCT 30.9 (L) 03/24/2018   PLT 336.0 03/24/2018   GLUCOSE 239 (H) 03/24/2018   CHOL 135 10/06/2017   TRIG 261.0 (H) 10/06/2017   HDL 46.30 10/06/2017   LDLDIRECT 55.0 10/06/2017   LDLCALC 43 03/28/2014   ALT 22 03/24/2018   AST 19 03/24/2018   NA 137 03/24/2018   K 3.2 (L) 03/24/2018   CL 97 03/24/2018   CREATININE 0.97 03/24/2018   BUN 19 03/24/2018   CO2 31 03/24/2018   TSH 2.45 03/24/2018   INR 1.06 08/29/2015   HGBA1C 7.7 (H) 03/24/2018   MICROALBUR <0.7 03/24/2017    Dg Chest 2 View  Result Date: 04/16/2016 CLINICAL DATA:  Recent diagnosis of pneumonia. Coughing and wheezing and shortness of  breath for 2 weeks. History of hypertension. EXAM: CHEST  2 VIEW COMPARISON:  08/29/2015 FINDINGS: Cardiac silhouette is top-normal in size. No mediastinal or hilar masses or evidence of adenopathy. Clear lungs.  No pleural effusion or pneumothorax. Bony thorax is intact. IMPRESSION: No active cardiopulmonary disease. Electronically Signed   By: Lajean Manes M.D.   On: 04/16/2016 15:36    Assessment & Plan:   Leiloni was seen today for anemia.  Diagnoses and all orders for this visit:  Essential hypertension, benign- Her blood pressure is well controlled.  She has mild hypokalemia.  I have asked her to restart the potassium supplement.  Acquired hypothyroidism- Her TSH is in the normal range.  Thyroid replacement therapy is not indicated. -     TSH; Future  Iron deficiency anemia due to chronic blood loss- Her anemia has worsened some and her iron level is low.  I have asked her to restart an iron supplement. -     CBC with Differential/Platelet; Future -     Ferritin; Future -     IBC panel; Future  Leukocytosis, unspecified type- This is stable and chronic, consistent with a benign etiology. -     CBC with Differential/Platelet;  Future  Type 2 diabetes mellitus with complication, with long-term current use of insulin ([place])- Her A1c is up to 7.7%.  I have asked her to add an SGLT-2 inhibitor to the basal insulin, metformin, and DPP 4 inhibitor. -     Comprehensive metabolic panel; Future -     Hemoglobin A1c; Future -     canagliflozin (INVOKANA) 100 MG TABS tablet; Take 1 tablet (100 mg total) by mouth daily before breakfast.  Fatty liver disease, nonalcoholic- Her liver enzymes are normal.  I have asked her to improve her lifestyle modifications. -     Comprehensive metabolic panel; Future  Deficiency anemia -     CBC with Differential/Platelet; Future -     Vitamin B1; Future -     Folate; Future -     Vitamin B12; Future -     Reticulocytes; Future  Preauricular lymphadenitis -     cefdinir (OMNICEF) 300 MG capsule; Take 1 capsule (300 mg total) by mouth 2 (two) times daily for 10 days.  External bleeding hemorrhoids -     Ambulatory referral to General Surgery  Iron deficiency anemia -     ferrous sulfate 325 (65 FE) MG tablet; Take 1 tablet (325 mg total) by mouth 2 (two) times daily with a meal.   I am having Maretta A. Egelston start on cefdinir and canagliflozin. I am also having her maintain her latanoprost, albuterol, aspirin, Insulin Pen Needle, albuterol, fluticasone furoate-vilanterol, sertraline, omeprazole, atorvastatin, potassium chloride SA, amLODipine, ALPRAZolam, irbesartan, carvedilol, chlorthalidone, gabapentin, FREESTYLE TEST STRIPS, oxyCODONE-acetaminophen, SitaGLIPtin-MetFORMIN HCl, FREESTYLE TEST STRIPS, TOUJEO SOLOSTAR, and ferrous sulfate.  Meds ordered this encounter  Medications  . cefdinir (OMNICEF) 300 MG capsule    Sig: Take 1 capsule (300 mg total) by mouth 2 (two) times daily for 10 days.    Dispense:  20 capsule    Refill:  0  . ferrous sulfate 325 (65 FE) MG tablet    Sig: Take 1 tablet (325 mg total) by mouth 2 (two) times daily with a meal.    Dispense:  180 tablet     Refill:  1  . canagliflozin (INVOKANA) 100 MG TABS tablet    Sig: Take 1 tablet (100 mg total) by mouth daily before breakfast.  Dispense:  90 tablet    Refill:  1     Follow-up: Return in about 3 weeks (around 04/14/2018).  [name], MD

## 2018-03-24 NOTE — Telephone Encounter (Signed)
Pt in on the schedule to see PCP.

## 2018-03-24 NOTE — Patient Instructions (Signed)
Lymphadenopathy Lymphadenopathy refers to swollen or enlarged lymph glands, also called lymph nodes. Lymph glands are part of your body's defense (immune) system, which protects the body from infections, germs, and diseases. Lymph glands are found in many locations in your body, including the neck, underarm, and groin. Many things can cause lymph glands to become enlarged. When your immune system responds to germs, such as viruses or bacteria, infection-fighting cells and fluid build up. This causes the glands to grow in size. Usually, this is not something to worry about. The swelling and any soreness often go away without treatment. However, swollen lymph glands can also be caused by a number of diseases. Your health care provider may do various tests to help determine the cause. If the cause of your swollen lymph glands cannot be found, it is important to monitor your condition to make sure the swelling goes away. Follow these instructions at home: Watch your condition for any changes. The following actions may help to lessen any discomfort you are feeling:  Get plenty of rest.  Take medicines only as directed by your health care provider. Your health care provider may recommend over-the-counter medicines for pain.  Apply moist heat compresses to the site of swollen lymph nodes as directed by your health care provider. This can help reduce any pain.  Check your lymph nodes daily for any changes.  Keep all follow-up visits as directed by your health care provider. This is important.  Contact a health care provider if:  Your lymph nodes are still swollen after 2 weeks.  Your swelling increases or spreads to other areas.  Your lymph nodes are hard, seem fixed to the skin, or are growing rapidly.  Your skin over the lymph nodes is red and inflamed.  You have a fever.  You have chills.  You have fatigue.  You develop a sore throat.  You have abdominal pain.  You have weight  loss.  You have night sweats. Get help right away if:  You notice fluid leaking from the area of the enlarged lymph node.  You have severe pain in any area of your body.  You have chest pain.  You have shortness of breath. This information is not intended to replace advice given to you by your health care provider. Make sure you discuss any questions you have with your health care provider. Document Released: 09/08/2008 Document Revised: 05/07/2016 Document Reviewed: 07/05/2014 Elsevier Interactive Patient Education  2018 [employer] Inc.  

## 2018-03-25 LAB — TSH: TSH: 2.45 u[IU]/mL (ref 0.35–4.50)

## 2018-03-25 LAB — VITAMIN B12: Vitamin B-12: 298 pg/mL (ref 211–911)

## 2018-03-25 LAB — FOLATE: Folate: 12.8 ng/mL (ref >5.9)

## 2018-03-27 ENCOUNTER — Encounter: Payer: Self-pay | Admitting: Internal Medicine

## 2018-03-27 LAB — RETICULOCYTES
ABS Retic: 86250 cells/uL — ABNORMAL HIGH (ref 20000–8000)
Retic Ct Pct: 2.5 %

## 2018-03-27 LAB — VITAMIN B1: Vitamin B1 (Thiamine): 9 nmol/L (ref 8–30)

## 2018-03-27 MED ORDER — FERROUS SULFATE 325 (65 FE) MG PO TABS: 325.0000 mg | ORAL_TABLET | Freq: Two times a day (BID) | ORAL | 1 refills | Status: DC

## 2018-03-28 MED ORDER — POTASSIUM CHLORIDE CRYS ER 20 MEQ PO TBCR: 20.0000 meq | EXTENDED_RELEASE_TABLET | Freq: Two times a day (BID) | ORAL | 1 refills | Status: DC

## 2018-03-28 MED ORDER — CANAGLIFLOZIN 100 MG PO TABS: 100.0000 mg | ORAL_TABLET | Freq: Every day | ORAL | 1 refills | Status: DC

## 2018-03-30 ENCOUNTER — Telehealth: Payer: Self-pay | Admitting: Internal Medicine

## 2018-03-30 DIAGNOSIS — E118 Type 2 diabetes mellitus with unspecified complications: Secondary | ICD-10-CM

## 2018-03-30 DIAGNOSIS — Z794 Long term (current) use of insulin: Secondary | ICD-10-CM

## 2018-03-30 NOTE — Telephone Encounter (Signed)
Copied from [place] [id number]. Topic: Quick Communication - See Telephone Encounter >> Mar 30, 2018  3:37 PM Percell Belt A wrote: CRM for notification. See Telephone encounter for: 03/30/18.   Pt called in and would like to know why the canagliflozin (INVOKANA) 100 MG TABS tablet [[id number]]  was called in.  She had a reaction to that last time she took it.   She also would like nurse to call her about her lab results    Best number [phone]

## 2018-03-31 NOTE — Telephone Encounter (Signed)
Pt stated that the invokana made her feel bad - pt wants to know if she is to take janumet, toujeo and the invokana? Please advise.    Pt informed of results per letter that was written.   Pt requested for a referral to DM dietician, referral has been entered.

## 2018-04-07 DIAGNOSIS — G4733 Obstructive sleep apnea (adult) (pediatric): Secondary | ICD-10-CM | POA: Diagnosis not present

## 2018-04-07 NOTE — Telephone Encounter (Signed)
Yes, I want her to take all of those

## 2018-04-11 NOTE — Telephone Encounter (Signed)
Pt contacted and informed of same.  

## 2018-04-12 ENCOUNTER — Other Ambulatory Visit: Payer: Self-pay

## 2018-04-12 NOTE — Patient Outreach (Signed)
[facility] [facility]) Care Management  04/12/2018  Valerie [name] June 21, 1946 [id number]  Nurse call line Nurse call line referral: 04/12/18 REFERRAL REASON;  Low blood pressure/ symptomatic Nurse call line recommendation: see physician within 4 hours Attempt #1  Telephone call to patient regarding nurse call line referral. Unable to reach patient. HIPAA compliant voice message left with call back phone number.   PLAN: RNCM will attempt 2nd telephone call within 4 business days.  RNCM will send outreach letter to attempt contact.  Shawnte Winton RN,BSN,CCM [facility] Telephonic  [phone]

## 2018-04-13 DIAGNOSIS — H40013 Open angle with borderline findings, low risk, bilateral: Secondary | ICD-10-CM | POA: Diagnosis not present

## 2018-04-13 DIAGNOSIS — Z961 Presence of intraocular lens: Secondary | ICD-10-CM | POA: Diagnosis not present

## 2018-04-13 DIAGNOSIS — E119 Type 2 diabetes mellitus without complications: Secondary | ICD-10-CM | POA: Diagnosis not present

## 2018-04-13 DIAGNOSIS — H25811 Combined forms of age-related cataract, right eye: Secondary | ICD-10-CM | POA: Diagnosis not present

## 2018-04-13 LAB — HM DIABETES EYE EXAM

## 2018-04-15 ENCOUNTER — Other Ambulatory Visit: Payer: Self-pay

## 2018-04-15 NOTE — Patient Outreach (Signed)
[place] [facility]) Care Management  04/15/2018  Valerie [name] [birth year] [id number]  .Nurse call line Nurse call line referral: 04/12/18 REFERRAL REASON;  Low blood pressure/ symptomatic Nurse call line recommendation: see physician within 4 hours Insurance: [employer]  Telephone call to patient regarding nurse call line referral. HIPAA verified with patient. Explained reason for call. Patient states she did call her doctor as advised by the nurse. States she has an appointment scheduled to see her doctor on 04/18/18.  Patient denies having any further dizziness.  States her last blood pressure reading was 118/69. Patient states she is diabetic and currently taking 2 diabetic medications.  Patient states she is taking an oral medication in the morning and using an insulin pen at night. Patient states her most recent A1c was 7.7. Patient reports her blood sugars range from the 60's to 300.  Patient states she knows what to do if her blood sugar gets low but is unsure what she should do if it gets high.  Patient states she is scheduled to see a diabetic dietician on 04/20/18.  Patient states her doctor would like to restart her on invocana.  Patient states she did not like the way it made her feel the last time she took it.   Patient states she really doesn't want to be on 3 medications.  States, " I don't feel like my A1c is that high that it would warrant 3 medications." patient states she does not feel comfortable taking the Invocana.  Patient states her currently diabetic medications, Toujeo and Janumit are expensive and difficult for her to afford.  Patient states she is running up her credit card bills to pay for them.  Patient states she is concerned about her weight. States she is currently weighs 260 lbs and her height is 5 ft 6 in. Patient states she does not understand why she has such a hard time losing weight. States she does not eat  A lot.  Patient reports she does not  sleep well and she is on the go a lot so she does make poor food choices at times.  Patient states she feels overwhelmed at times. She reports being on xanax. 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  Patient states she spends most of her time taking care of him.   RNCM offered [facility] care management services. Patient verbally agreed to services with RN Health coach and pharmacy.  Patient denies need for social worker at this time related to anxiety/depression symptoms. RNCM advised patient to talk with her doctor regarding feeling overwhelmed. Patient verbalized understanding.  Patient request advance directive packet.  RNCM provided contact name and number: [phone] or main office number [phone] and 24 hour nurse advise line [phone].    ASSESSMENT: PHQ2 - 2,  PHQ9 - 12  PLAN; RNCM will refer patient to health coach and pharmacy  [name] RN,BSN,CCM [facility] Telephonic  [phone]

## 2018-04-18 ENCOUNTER — Other Ambulatory Visit (INDEPENDENT_AMBULATORY_CARE_PROVIDER_SITE_OTHER): Payer: PPO

## 2018-04-18 ENCOUNTER — Ambulatory Visit (INDEPENDENT_AMBULATORY_CARE_PROVIDER_SITE_OTHER): Payer: PPO | Admitting: Internal Medicine

## 2018-04-18 ENCOUNTER — Other Ambulatory Visit: Payer: Self-pay | Admitting: Pharmacist

## 2018-04-18 ENCOUNTER — Encounter: Payer: Self-pay | Admitting: Internal Medicine

## 2018-04-18 VITALS — BP 150/60 | HR 94 | Temp 98.6°F | Resp 16 | Ht 66.0 in | Wt 268.0 lb

## 2018-04-18 DIAGNOSIS — D500 Iron deficiency anemia secondary to blood loss (chronic): Secondary | ICD-10-CM

## 2018-04-18 DIAGNOSIS — E118 Type 2 diabetes mellitus with unspecified complications: Secondary | ICD-10-CM | POA: Diagnosis not present

## 2018-04-18 DIAGNOSIS — Z23 Encounter for immunization: Secondary | ICD-10-CM

## 2018-04-18 DIAGNOSIS — I10 Essential (primary) hypertension: Secondary | ICD-10-CM | POA: Diagnosis not present

## 2018-04-18 DIAGNOSIS — Z794 Long term (current) use of insulin: Secondary | ICD-10-CM | POA: Diagnosis not present

## 2018-04-18 DIAGNOSIS — K641 Second degree hemorrhoids: Secondary | ICD-10-CM | POA: Diagnosis not present

## 2018-04-18 DIAGNOSIS — J4520 Mild intermittent asthma, uncomplicated: Secondary | ICD-10-CM

## 2018-04-18 LAB — CBC WITH DIFFERENTIAL/PLATELET
Basophils Absolute: 0.1 10*3/uL (ref 0.0–0.1)
Basophils Relative: 0.8 % (ref 0.0–3.0)
Eosinophils Absolute: 0.6 10*3/uL (ref 0.0–0.7)
Eosinophils Relative: 5.3 % — ABNORMAL HIGH (ref 0.0–5.0)
HCT: 31.4 % — ABNORMAL LOW (ref 36.0–46.0)
Hemoglobin: 10.3 g/dL — ABNORMAL LOW (ref 12.0–15.0)
Lymphocytes Relative: 27.5 % (ref 12.0–46.0)
Lymphs Abs: 3.1 10*3/uL (ref 0.7–4.0)
MCHC: 32.9 g/dL (ref 30.0–36.0)
MCV: 88.1 fl (ref 78.0–100.0)
Monocytes Absolute: 1 10*3/uL (ref 0.1–1.0)
Monocytes Relative: 8.7 % (ref 3.0–12.0)
Neutro Abs: 6.5 10*3/uL (ref 1.4–7.7)
Neutrophils Relative %: 57.7 % (ref 43.0–77.0)
Platelets: 357 10*3/uL (ref 150.0–400.0)
RBC: 3.57 Mil/uL — ABNORMAL LOW (ref 3.87–5.11)
RDW: 16 % — ABNORMAL HIGH (ref 11.5–15.5)
WBC: 11.2 10*3/uL — ABNORMAL HIGH (ref 4.0–10.5)

## 2018-04-18 MED ORDER — DAPAGLIFLOZIN PROPANEDIOL 5 MG PO TABS: 5.0000 mg | ORAL_TABLET | Freq: Every day | ORAL | 1 refills | Status: DC

## 2018-04-18 MED ORDER — ALBUTEROL SULFATE HFA 108 (90 BASE) MCG/ACT IN AERS: 2.0000 | INHALATION_SPRAY | Freq: Four times a day (QID) | RESPIRATORY_TRACT | 4 refills | Status: DC | PRN

## 2018-04-18 NOTE — Patient Instructions (Signed)

## 2018-04-18 NOTE — Progress Notes (Signed)
Subjective:  Patient ID: Valerie [name], female    DOB: December 23, 1945  Age: 72 y.o. MRN: [id number]  CC: Anemia; Hypertension; and Diabetes   HPI Valerie [name] presents for f/up - She has a history of rectal bleeding.  She saw a surgeon earlier today who treated a bleeding hemorrhoid.  She tells me she is compliant with the iron replacement.  She is concerned that her blood sugars are not well controlled.  She is not taking Invokana.  She is not sure why but thinks she thinks she had side effects with it previously.  She tells me she is compliant with her other meds for diabetes.  She also complains of chronic symptoms including fatigue, weight gain, and chronic unchanged shortness of breath and intermittent stabbing chest pains.  Outpatient Medications Prior to Visit  Medication Sig Dispense Refill  . albuterol (PROVENTIL) (2.5 MG/3ML) 0.083% nebulizer solution     . ALPRAZolam (XANAX) 0.25 MG tablet TAKE ONE TABLET BY MOUTH TWICE DAILY AS NEEDED FOR ANXIETY 75 tablet 5  . amLODipine (NORVASC) 10 MG tablet TAKE 1 TABLET BY MOUTH ONCE DAILY 90 tablet 1  . aspirin 81 MG tablet Take 81 mg by mouth daily.    [name] atorvastatin (LIPITOR) 20 MG tablet Take 1 tablet (20 mg total) by mouth daily. 90 tablet 3  . carvedilol (COREG) 25 MG tablet TAKE 1 TABLET BY MOUTH TWICE DAILY WITH MEALS 180 tablet 1  . chlorthalidone (HYGROTON) 25 MG tablet TAKE 1 TABLET BY MOUTH ONCE DAILY 90 tablet 1  . ferrous sulfate 325 (65 FE) MG tablet Take 1 tablet (325 mg total) by mouth 2 (two) times daily with a meal. 180 tablet 1  . fluticasone furoate-vilanterol (BREO ELLIPTA) 200-25 MCG/INH AEPB Inhale 1 puff into the lungs daily. 30 each 11  . FREESTYLE TEST STRIPS test strip USE TO TEST BLOOD SUGAR UP TO 3 TIMES A DAY 100 each 11  . FREESTYLE TEST STRIPS test strip USE TO TEST BLOOD SUGAR UP TO 3 TIMES A DAY 100 each 11  . gabapentin (NEURONTIN) 300 MG capsule TAKE 1 CAPSULE BY MOUTH THREE TIMES DAILY 270 capsule 1    . Insulin Pen Needle 31G X 5 MM MISC Use once daily with insulin 100 each 3  . irbesartan (AVAPRO) 300 MG tablet Take 1 tablet (300 mg total) by mouth daily. 90 tablet 1  . latanoprost (XALATAN) 0.005 % ophthalmic solution Place 1 drop into both eyes at bedtime.    [name] omeprazole (PRILOSEC) 40 MG capsule Take 1 capsule (40 mg total) by mouth daily. 90 capsule 3  . oxyCODONE-acetaminophen (PERCOCET) 7.5-325 MG tablet Take 1 tablet by mouth every 6 (six) hours as needed for severe pain. 90 tablet 0  . potassium chloride SA (K-DUR,KLOR-CON) 20 MEQ tablet Take 1 tablet (20 mEq total) by mouth 2 (two) times daily. 180 tablet 1  . sertraline (ZOLOFT) 100 MG tablet Take 1 tablet (100 mg total) by mouth daily. 90 tablet 3  . SitaGLIPtin-MetFORMIN HCl (JANUMET XR) [phone] MG TB24 Take 1 tablet by mouth daily. 90 tablet 1  . TOUJEO SOLOSTAR 300 UNIT/ML SOPN INJECT 30 UNITS SUBCUTANEOUSLY ONCE DAILY 6 mL 5  . albuterol (VENTOLIN HFA) 108 (90 BASE) MCG/ACT inhaler Inhale 2 puffs into the lungs every 6 (six) hours as needed. For shortness of breath 3 Inhaler 4  . canagliflozin (INVOKANA) 100 MG TABS tablet Take 1 tablet (100 mg total) by mouth daily before breakfast. 90 tablet 1  No facility-administered medications prior to visit.     ROS Review of Systems  Constitutional: Positive for fatigue and unexpected weight change. Negative for appetite change, chills and diaphoresis.  HENT: Negative.  Negative for trouble swallowing.   Eyes: Negative for visual disturbance.  Respiratory: Positive for shortness of breath and wheezing. Negative for cough and chest tightness.        She has rare wheezing and wants a refill of albuterol  Cardiovascular: Positive for chest pain. Negative for palpitations and leg swelling.  Gastrointestinal: Positive for anal bleeding and blood in stool. Negative for abdominal pain, constipation, diarrhea, nausea and vomiting.  Endocrine: Negative.  Negative for polydipsia,  polyphagia and polyuria.  Genitourinary: Negative.  Negative for difficulty urinating, dysuria, hematuria and vaginal bleeding.  Musculoskeletal: Negative.  Negative for arthralgias and myalgias.  Skin: Negative.  Negative for color change and pallor.  Neurological: Negative.  Negative for dizziness, weakness, light-headedness and numbness.  Hematological: Negative for adenopathy. Does not bruise/bleed easily.  Psychiatric/Behavioral: Negative.     Objective:  BP (!) 150/60 (BP Location: Left Arm, Patient Position: Sitting, Cuff Size: Large)   Pulse 94   Temp 98.6 F (37 C) (Oral)   Resp 16   Ht 5\' 6"  (1.676 m)   Wt 268 lb (121.6 kg)   SpO2 97%   BMI 43.26 kg/m   BP Readings from Last 3 Encounters:  04/18/18 (!) 150/60  03/24/18 134/68  11/03/17 122/71    Wt Readings from Last 3 Encounters:  04/18/18 268 lb (121.6 kg)  03/24/18 265 lb 1.9 oz (120.3 kg)  11/03/17 267 lb (121.1 kg)    Physical Exam  Constitutional: She is oriented to person, place, and time. No distress.  HENT:  Mouth/Throat: Oropharynx is clear and moist. No oropharyngeal exudate.  Eyes: Conjunctivae are normal. No scleral icterus.  Neck: Normal range of motion. Neck supple. No JVD present. No thyromegaly present.  Cardiovascular: Normal rate, regular rhythm and normal heart sounds. Exam reveals no gallop and no friction rub.  No murmur heard. Pulmonary/Chest: Effort normal and breath sounds normal. No stridor. No respiratory distress. She has no wheezes. She has no rales.  Abdominal: Soft. Normal appearance and bowel sounds are normal. She exhibits no distension and no mass. There is no hepatosplenomegaly. There is no tenderness.  Musculoskeletal: Normal range of motion. She exhibits no edema, tenderness or deformity.  Lymphadenopathy:    She has no cervical adenopathy.  Neurological: She is alert and oriented to person, place, and time.  Skin: Skin is warm and dry. She is not diaphoretic.    Psychiatric: She has a normal mood and affect. Her behavior is normal. Judgment and thought content normal.  Vitals reviewed.   Lab Results  Component Value Date   WBC 11.2 (H) 04/18/2018   HGB 10.3 (L) 04/18/2018   HCT 31.4 (L) 04/18/2018   PLT 357.0 04/18/2018   GLUCOSE 239 (H) 03/24/2018   CHOL 135 10/06/2017   TRIG 261.0 (H) 10/06/2017   HDL 46.30 10/06/2017   LDLDIRECT 55.0 10/06/2017   LDLCALC 43 03/28/2014   ALT 22 03/24/2018   AST 19 03/24/2018   NA 137 03/24/2018   K 3.2 (L) 03/24/2018   CL 97 03/24/2018   CREATININE 0.97 03/24/2018   BUN 19 03/24/2018   CO2 31 03/24/2018   TSH 2.45 03/24/2018   INR 1.06 08/29/2015   HGBA1C 7.7 (H) 03/24/2018   MICROALBUR <0.7 03/24/2017    Dg Chest 2 View  Result  Date: 04/16/2016 CLINICAL DATA:  Recent diagnosis of pneumonia. Coughing and wheezing and shortness of breath for 2 weeks. History of hypertension. EXAM: CHEST  2 VIEW COMPARISON:  08/29/2015 FINDINGS: Cardiac silhouette is top-normal in size. No mediastinal or hilar masses or evidence of adenopathy. Clear lungs.  No pleural effusion or pneumothorax. Bony thorax is intact. IMPRESSION: No active cardiopulmonary disease. Electronically Signed   By: Lajean Manes M.D.   On: 04/16/2016 15:36    Assessment & Plan:   Jaleesa was seen today for anemia, hypertension and diabetes.  Diagnoses and all orders for this visit:  Iron deficiency anemia due to chronic blood loss- Her H&H have improved slightly.  I have encouraged her to be compliant with the iron replacement therapy.  Now that the hemorrhoid has been treated she should not lose any more blood.  There may also be a component of anemia of chronic disease. -     CBC with Differential/Platelet; Future  Essential hypertension, benign- Her blood pressure is adequately well controlled.  Type 2 diabetes mellitus with complication, with long-term current use of insulin ([place])- Her recent A1c was 7.7%.  Her blood sugars are not  adequately well controlled.  Will restart the SGLT2 inhibitor but will try a different one.  I have asked her to continue with the DPP 4 inhibitor and metformin. -     dapagliflozin propanediol (FARXIGA) 5 MG TABS tablet; Take 5 mg by mouth daily.  Mild intermittent asthma without complication -     albuterol (VENTOLIN HFA) 108 (90 Base) MCG/ACT inhaler; Inhale 2 puffs into the lungs every 6 (six) hours as needed. For shortness of breath  Need for pneumococcal vaccination -     Pneumococcal polysaccharide vaccine 23-valent greater than or equal to 2yo subcutaneous/IM   I have discontinued Pamala Hurry A. Ducat's canagliflozin. I am also having her start on dapagliflozin propanediol. Additionally, I am having her maintain her latanoprost, aspirin, Insulin Pen Needle, albuterol, fluticasone furoate-vilanterol, sertraline, omeprazole, atorvastatin, amLODipine, ALPRAZolam, irbesartan, carvedilol, chlorthalidone, gabapentin, FREESTYLE TEST STRIPS, oxyCODONE-acetaminophen, SitaGLIPtin-MetFORMIN HCl, FREESTYLE TEST STRIPS, TOUJEO SOLOSTAR, ferrous sulfate, potassium chloride SA, and albuterol.  Meds ordered this encounter  Medications  . dapagliflozin propanediol (FARXIGA) 5 MG TABS tablet    Sig: Take 5 mg by mouth daily.    Dispense:  90 tablet    Refill:  1  . albuterol (VENTOLIN HFA) 108 (90 Base) MCG/ACT inhaler    Sig: Inhale 2 puffs into the lungs every 6 (six) hours as needed. For shortness of breath    Dispense:  3 Inhaler    Refill:  4     Follow-up: Return in about 3 months (around 07/19/2018).  [name], MD

## 2018-04-18 NOTE — Patient Outreach (Signed)
[facility]) Care Management  04/18/2018  Valerie [name] Feb 02, 1946 [id number]   72 year old female referred to [place] for medication assistance and education with diabetes medications.    Unsuccessful call attempts to Valerie [name] today.  I left a HIPAA compliant voicemail on the home phone.  Valerie [name] answered the mobile phone but stated she was unable to talk today.   Plan: I will await call back from Valerie [name] and will reach out to her again later this week if I have not heard back yet.   Ralene Bathe, PharmD, [place] [phone]

## 2018-04-20 ENCOUNTER — Encounter: Payer: PPO | Attending: Internal Medicine | Admitting: *Deleted

## 2018-04-20 ENCOUNTER — Encounter: Payer: Self-pay | Admitting: *Deleted

## 2018-04-20 DIAGNOSIS — Z794 Long term (current) use of insulin: Secondary | ICD-10-CM | POA: Insufficient documentation

## 2018-04-20 DIAGNOSIS — E118 Type 2 diabetes mellitus with unspecified complications: Secondary | ICD-10-CM

## 2018-04-20 NOTE — Progress Notes (Signed)
Diabetes Self-Management Education  Visit Type:  Follow-up  Appt. Start Time: 1530 Appt. End Time: 1700  04/20/2018  [name], identified by name and date of birth, is a 72 y.o. female with a diagnosis of Diabetes:  .  She is here with her husband who participated in the visit. She has had diabetes for a long time and gets frustrated with not being able to control her blood sugars as well as she would like to. Diet history indicates some excess in portion sizes and intake of some concentrated sweets and beverages. Activity is limited but she does state she cleans her own home. She states she only gets about 3-4 hours of sleep a night, and between Dr appointments and errands, she eats on the run a lot.   ASSESSMENT  Height 5\' 6"  (1.676 m), weight 263 lb (119.3 kg). Body mass index is 42.45 kg/m.   Diabetes Self-Management Education - 04/20/18 1529      Health Coping   How would you rate your overall health?  Fair      Psychosocial Assessment   Patient Belief/Attitude about Diabetes  Afraid    Patient Concerns  Nutrition/Meal planning;Weight Control;Glycemic Control      Complications   Last HgB A1C per patient/outside source  -- 7.7    Have you had a dilated eye exam in the past 12 months?  Yes    Have you had a dental exam in the past 12 months?  Yes    Are you checking your feet?  Yes    How many days per week are you checking your feet?  7      Exercise   Exercise Type  ADL's      Patient Education   Previous Diabetes Education  Yes (please comment)    Disease state   Definition of diabetes, type 1 and 2, and the diagnosis of diabetes    Nutrition management   Role of diet in the treatment of diabetes and the relationship between the three main macronutrients and blood glucose level;Food label reading, portion sizes and measuring food.;Carbohydrate counting;Reviewed blood glucose goals for pre and post meals and how to evaluate the patients' food intake on their blood  glucose level.    Physical activity and exercise   Role of exercise on diabetes management, blood pressure control and cardiac health.;Helped patient identify appropriate exercises in relation to his/her diabetes, diabetes complications and other health issue.    Monitoring  Taught/discussed recording of test results and interpretation of SMBG.;Identified appropriate SMBG and/or A1C goals.    Chronic complications  Relationship between chronic complications and blood glucose control    Psychosocial adjustment  Worked with patient to identify barriers to care and solutions;Role of stress on diabetes;Identified and addressed patients feelings and concerns about diabetes      Individualized Goals (developed by patient)   Nutrition  Follow meal plan discussed    Physical Activity  15 minutes per day    Medications  take my medication as prescribed    Monitoring   test blood glucose pre and post meals as discussed      Outcomes   Program Status  Not Completed      Subsequent Visit   Since your last visit have you continued or begun to take your medications as prescribed?  Yes    Since your last visit, are you checking your blood glucose at least once a day?  Yes       Learning  Objective:  Patient will have a greater understanding of diabetes self-management. Patient education plan is to attend individual and/or group sessions per assessed needs and concerns.  Plan:   Patient Instructions  Plan:   Aim for 3 Carb Choices per meal (45 grams) +/- 1 either way   Aim for 0-2 Carbs per snack if hungry   Include protein in moderation with your meals and snacks  Consider reading food labels for Total Carbohydrate of foods  Consider  increasing your activity level by Arm Chair exercises or dancing for 5 minutes several times each day as tolerated AND consider checking your blood after exercise occasionally so you can see the benefit.  Consider checking BG at alternate times per day    Continue taking medication as directed by MD  Expected Outcomes:  Demonstrated interest in learning. Expect positive outcomes  Education material provided: Living Well with Diabetes, Food label handouts, A1C conversion sheet, Meal plan card, Snack sheet and Carbohydrate counting sheet  If problems or questions, patient to contact team via:  Phone  Future DSME appointment: - 4-6 wks

## 2018-04-20 NOTE — Patient Instructions (Signed)
Plan:   Aim for 3 Carb Choices per meal (45 grams) +/- 1 either way   Aim for 0-2 Carbs per snack if hungry   Include protein in moderation with your meals and snacks  Consider reading food labels for Total Carbohydrate of foods  Consider  increasing your activity level by Arm Chair exercises or dancing for 5 minutes several times each day as tolerated AND consider checking your blood after exercise occasionally so you can see the benefit.  Consider checking BG at alternate times per day   Continue taking medication as directed by MD

## 2018-04-21 ENCOUNTER — Other Ambulatory Visit: Payer: Self-pay | Admitting: Pharmacist

## 2018-04-21 ENCOUNTER — Other Ambulatory Visit: Payer: Self-pay | Admitting: Pharmacy Technician

## 2018-04-21 ENCOUNTER — Other Ambulatory Visit: Payer: Self-pay | Admitting: *Deleted

## 2018-04-21 ENCOUNTER — Ambulatory Visit: Payer: Self-pay | Admitting: Pharmacist

## 2018-04-21 NOTE — Patient Outreach (Addendum)
[facility]) Care Management  04/21/2018  Valerie [name] 11/20/1946 [id number]  72 year old female referred to [place] for medication assistance and education.  PMHx includes type 2 diabetes, HTN, HLD, iron deficiency anemia, depression with anxiety, mild intermittent asthma, GERD, hypothyroidism, nonalcoholic fatty liver disease, spinal stenosis, hemorrhoids, and obesity.   Unsuccessful call outreach attempt #2 to Valerie [name].  I left a HIPAA compliant voicemail on her home and mobile phone as well as with her son, Valerie [name].    Plan: I will mail patient a letter describing [facility] services and follow-up with her again next week.   Ralene Bathe, PharmD, [place] [phone]  Subjective:  Incoming call received from Valerie [name].  HIPAA identifiers verified. Ms. Frede is agreeable to [facility] services.  She reports that she has trouble paying for her diabetes medications, including Toujeo, Janumet, and a newly prescribed medication, Farxiga.  Patient reports she is currently using samples of Farxiga from Dr. Ronnald Ramp.  She reports she went to a diabetes class yesterday which was very helpful and that she "learned a lot."  She states she has been feeling weak and dizzy intermittently for several weeks and that her blood pressure "is up and down."  She reports she is stressed by taking care of her spouse and that she recently had a hemorrhoid banding procedure this past Monday.    Objective:  Hemoglobin A1C = 7.7 (03/24/18) SCr = 0.97 (03/24/18) Iron = 35 (03/24/18)  Medications Reviewed Today    Reviewed by Valerie [name], RPH (Pharmacist) on 04/21/18 at [place] List Status: <None>  Medication Order Taking? Sig Documenting Provider Last Dose Status Informant  albuterol (PROVENTIL) (2.5 MG/3ML) 0.083% nebulizer solution [id number] Yes  [provider] Taking Active            Med Note [facility] [place] Apr 16, 2016   1:10 PM) Received from: External Pharmacy  albuterol (VENTOLIN HFA) 108 (90 Base) MCG/ACT inhaler [id number] Yes Inhale 2 puffs into the lungs every 6 (six) hours as needed. For shortness of breath Janith Lima, MD Taking Active   ALPRAZolam Duanne Moron) 0.25 MG tablet [id number] Yes TAKE ONE TABLET BY MOUTH TWICE DAILY AS NEEDED FOR ANXIETY Janith Lima, MD Taking Active   amLODipine (NORVASC) 10 MG tablet [id number] Yes TAKE 1 TABLET BY MOUTH ONCE DAILY Janith Lima, MD Taking Active   aspirin 81 MG tablet [id number] Yes Take 81 mg by mouth daily. [provider] Taking Active Self  atorvastatin (LIPITOR) 20 MG tablet [id number] Yes Take 1 tablet (20 mg total) by mouth daily. Janith Lima, MD Taking Active   carvedilol (COREG) 25 MG tablet [id number] Yes TAKE 1 TABLET BY MOUTH TWICE DAILY WITH MEALS Janith Lima, MD Taking Active   chlorthalidone (HYGROTON) 25 MG tablet [id number] Yes TAKE 1 TABLET BY MOUTH ONCE DAILY Janith Lima, MD Taking Active   dapagliflozin propanediol (FARXIGA) 5 MG TABS tablet [id number] Yes Take 5 mg by mouth daily. Janith Lima, MD Taking Active   ferrous sulfate 325 (65 FE) MG tablet [id number] Yes Take 1 tablet (325 mg total) by mouth 2 (two) times daily with a meal. Janith Lima, MD Taking Active   FREESTYLE TEST STRIPS test strip [id number] Yes USE TO TEST BLOOD SUGAR UP TO 3 TIMES A DAY Janith Lima, MD Taking Active   gabapentin (NEURONTIN) 300 MG capsule [id number] Yes TAKE 1  CAPSULE BY MOUTH THREE TIMES DAILY Janith Lima, MD Taking Active   Insulin Pen Needle 31G X 5 MM MISC [id number] Yes Use once daily with insulin Janith Lima, MD Taking Active Self  irbesartan (AVAPRO) 300 MG tablet [id number] Yes Take 1 tablet (300 mg total) by mouth daily. Janith Lima, MD Taking Active   latanoprost (XALATAN) 0.005 % ophthalmic solution [id number] Yes Place 1 drop into both eyes at bedtime. [provider] Taking Active Self  omeprazole  (PRILOSEC) 40 MG capsule [id number] Yes Take 1 capsule (40 mg total) by mouth daily. Janith Lima, MD Taking Active   oxyCODONE-acetaminophen (PERCOCET) 7.5-325 MG tablet [id number] Yes Take 1 tablet by mouth every 6 (six) hours as needed for severe pain. Janith Lima, MD Taking Active   potassium chloride SA (K-DUR,KLOR-CON) 20 MEQ tablet [id number] Yes Take 1 tablet (20 mEq total) by mouth 2 (two) times daily. Janith Lima, MD Taking Active   Psyllium [facility] FIBER PO) [id number] Yes Take 1 Dose by mouth 2 (two) times daily. [provider] Taking Active   sertraline (ZOLOFT) 100 MG tablet [id number] No Take 1 tablet (100 mg total) by mouth daily.  Patient not taking:  Reported on 04/21/2018   Janith Lima, MD Not Taking Active   SitaGLIPtin-MetFORMIN HCl (JANUMET XR) [phone] MG TB24 [id number] Yes Take 1 tablet by mouth daily. Janith Lima, MD Taking Active   TOUJEO SOLOSTAR 300 UNIT/ML [place] [facility] [id number] Yes INJECT 25 UNITS SUBCUTANEOUSLY ONCE DAILY Janith Lima, MD Taking Active          Assessment:  Drugs sorted by system:  Neurologic/Psychologic: alprazolam, gabapentin, sertraline  Cardiovascular: amlodipine, aspirin 22m, atorvastatin, carvedilol, chlorthalidone, irbesartan  Pulmonary/Allergy: Albuterol nebulizer + inhaler  Gastrointestinal: omeprazole, psyllium  Endocrine:dapagliflozin, sitagliptin-metformin, insulin glargine  Renal: none  Topical: latanoprost  Pain: oxycodone-acetaminophen  Vitamins/Minerals: potassium  Infectious Diseases: none  Miscellaneous:ferrous sulfate  Duplications in therapy: none Gaps in therapy: none Medications to avoid in the elderly:  Alprazolam:  This drug is identified in the [place] Criteria as a potentially inappropriate medication to be avoided in patients 65 years and older (independent of diagnosis or condition) due to increased risk of impaired cognition, delirium, falls, fractures, and motor vehicle  accidents with benzodiazepine use. Consider changing to a shorter acting benzodiazepine (temazepam, oxazepam, lorazepam) if therapy indicated.   Drug interactions: none Other issues noted:  Sertraline: Not currently taking. Patient reports she will discuss this with provider.   Medication Assistance:  Patient currently has [employer] insurance.  She reports she has high co-pays for Toujeo insulin and Janumet and does not think she can afford a 3rd diabetes medication, FWilder Gladealthough her blood sugars are not at goal.    Extra Help: Patient does not believe she is eligible for Extra Help based on combined income from her and her spouse  Patient Assistance Programs: -Toujeo: [employer] requires 5% TROOP which patient does not believe she has met yet.   -Janumet: Patient eligible to apply based on income and no TROOP requirement.  I reviewed with patient that this takes several weeks for paperwork but once approved, she would receive medication at no charge through the end of the calendar year.  Patient is agreeable to apply  -Farxiga: [place] requires 3% TROOP which patient is unsure if she has met yet.    If she has not met TROOP, could consider changing to [facility]agliflozin ([facility] which does not have TROOP requirement.   Plan:  I will reach out to [employer] for patient's TROOP to confirm if she is eligible to apply for Touejo or [place] / [place]. I will route patient assistance letter to pharmacy technician, Etter Sjogren, who will coordinate application process for Janumet through [employer].  She will assist with obtaining all pertinent documents from both patient and Dr. Ronnald Ramp and submit application once completed.     I will follow-up with Ms. Coombes next week regarding medication assistance.  Patient will look for financial documents to confirm income.    Ralene Bathe, PharmD, [place] [phone]

## 2018-04-21 NOTE — Patient Outreach (Signed)
[facility]) Care Management  04/21/2018  BRITISH MOYD Feb 18, 1946 [id number]   [place] Introduction Call  Referral Date:  04/15/2018 Referral Source:  Nurse Call Line Screening Reason for Referral:  Disease Management Education Insurance:  [employer]   Outreach Attempt:  Outreach attempt #1 to patient for introductory call. No answer. RN Health Coach left HIPAA compliant voicemail message along with contact information.  Plan:  Unsuccessful outreach letter already sent to patient from [place] will make another outreach attempt to patient within 3-4 business days if no return call back from patient.   [place] [phone] [email or web address]

## 2018-04-21 NOTE — Addendum Note (Signed)
Addended by: Ralene Bathe E on: 04/21/2018 03:26 PM   Modules accepted: Orders

## 2018-04-21 NOTE — Patient Outreach (Signed)
[facility]) Care Management  04/21/2018  Valerie [name] [birth year] [id number]   Received [employer] patient assistance referral from [place] for patients Janumet XR and Proventil HFA. Will mail out patient portion and provider portion ([name]) on 04/22/18.  Will follow up with patient in 7-10 business days to verify receipt of application.  Valerie [name] [place], [place] Management [phone]

## 2018-04-25 ENCOUNTER — Ambulatory Visit: Payer: Self-pay | Admitting: Pharmacist

## 2018-04-27 ENCOUNTER — Other Ambulatory Visit: Payer: Self-pay | Admitting: *Deleted

## 2018-04-27 NOTE — Patient Outreach (Signed)
[place] [facility]) Care Management  04/27/2018  MONTSERRATH MADDING Mar 12, 1946 [id number]   [place] Introduction Call  Referral Date:  04/15/2018 Referral Source:  Nurse Call Line Screening Reason for Referral:  Disease Management Education Insurance:  Health Team Advantage   Outreach Attempt:  Successful telephone outreach to patient for introduction call.  HIPAA verified with patient.  RN Health Coach introduced self and role.  Patient verbally agrees to monthly telephone outreaches.  Patient stating she is unable to complete initial assessment at this time and requesting a call back at a later date and time.  Plan:  RN Health Coach will attempt outreach to complete initial telephone assessment within the next 10 business days.  [place] [phone] [email or web address]

## 2018-04-28 ENCOUNTER — Ambulatory Visit: Payer: Self-pay | Admitting: Pharmacist

## 2018-04-28 ENCOUNTER — Other Ambulatory Visit: Payer: Self-pay | Admitting: Pharmacist

## 2018-04-28 NOTE — Patient Outreach (Signed)
[facility]) Care Management  04/28/2018  Valerie [name] Aug 29, 1946 [id number]  Successful outreach call to Valerie [name].  HIPAA identifiers verified.   Medication assistance:  Valerie [name] reports she has not had time to find her financial information but states she will look this weekend.  She is still interested in applying for PAPs for her diabetes medications and albuterol inhaler.  We reviewed TROOP requirements for [place] and [place] and possibly changing to [place] if needed to avoid TROOP through [employer].  Patient previously on Invokana which was discontinued due to ADEs however she reports she is tolerating [place] without any issues so far.  She has received the [employer] application for [employer] and Proventil inhaler and will attempt to complete and return this weekend.  She requests that I call next Tuesday during the day when spouse is at dialysis.    Plan: I will follow-up with Valerie [name] next Tuesday at 1:00 PM.   Ralene Bathe, PharmD, [place] [phone]

## 2018-05-02 ENCOUNTER — Other Ambulatory Visit: Payer: Self-pay | Admitting: Pharmacy Technician

## 2018-05-02 NOTE — Patient Outreach (Signed)
[place]) Care Management  05/02/2018  MALARIE TAPPEN 12-10-1946 [id number]   Successful outreach call to patient, HIPAA identifiers verified. Mrs. Tall verified that she has received the Merck patient application and would try to mail it out in the next couple of days.   Will follow up with patient in the 2 weeks once completed application is mailed out.  [name] [place], [place] Management [phone]

## 2018-05-03 ENCOUNTER — Other Ambulatory Visit: Payer: Self-pay | Admitting: Pharmacist

## 2018-05-03 ENCOUNTER — Other Ambulatory Visit: Payer: Self-pay | Admitting: *Deleted

## 2018-05-03 ENCOUNTER — Ambulatory Visit: Payer: Self-pay | Admitting: Pharmacist

## 2018-05-03 ENCOUNTER — Other Ambulatory Visit: Payer: Self-pay | Admitting: Pharmacy Technician

## 2018-05-03 ENCOUNTER — Encounter: Payer: Self-pay | Admitting: *Deleted

## 2018-05-03 NOTE — Patient Outreach (Signed)
[facility]) Care Management  [place]  05/03/2018   Valerie [name] Feb 05, 1946 [id number]   RN Health Coach Initial Assessment  Referral Date:  04/15/2018 Referral Source:  Nurse Call Line Screening Reason for Referral:  Disease Management Education Insurance:  Health Team Advantage   Outreach Attempt:  Successful telephone outreach to patient for initial telephone assessment.  HIPAA verified with patient.  Patient completed initial telephone assessment.  Social:  Patient lives at home with her husband.  States she is independent with her ADLs and IADLs.  Verbalizes she is the primary caregiver for her husband, whom is on Hemodialysis and the caregiver for her son with Cerebral Palsy.  Verbalizes she has limited resources for a caregiver for herself.  Drives herself to her medical appointments.  Reports she had a fall last week at her husband's doctors appointment.  Unsure of how she fell and did not injure anything.  Ambulates independently.  DME in the home include:  Straight cane, rolling walker, eyeglasses, CBG meter, blood pressure machine, BIPAP, shower chair with back, nebulizer, and bedside commode.  Conditions:  Per chart review and discussion with patient, PMH include but not limited to: diabetes, anemia, hypertension, anxiety, depression, asthma, arthritis, GERD, glaucoma, cataracts, left cataract removal, hyperlipidemia, acquired hypothyroidism, memory deficit, and sleep apnea with BIPAP at bedtime.  Patient reports she has been having lightheadedness and dizziness.  States the physician is aware and she monitors her blood pressures when she feels this way.  Reports her blood pressures have been 110-120/60's.  Fall last week was without injury and patient states she did not lose consciousness.  Patient stating she has attended Nutrition and Diabetes class the beginning of this month and has learned a lot of dietary adjustments she has began to make.  Reports  her blood sugar ranges has been much better with change/addition to her medication earlier in the month.  This mornings fasting blood sugar was 148 with ranges of 130-150's.  Reports monitoring blood sugars about 2-3 times a day.  Patient states she received her flu shot Fall 2018 and received her pneumonia shot 04/13/2018.  Medications: Patient reports taking about 13 medications.  [facility] Pharmacist is following for medication assistance.  Patient states she manages her own medications and fills a weekly pill box.  Denies any questions or concerns with her medications other than affordability that [facility] Pharmacist is assisting with.  Encounter Medications:  Outpatient Encounter Medications as of 05/03/2018  Medication Sig Note  . albuterol (PROVENTIL) (2.5 MG/3ML) 0.083% nebulizer solution  04/16/2016: Received from: External Pharmacy  . albuterol (VENTOLIN HFA) 108 (90 Base) MCG/ACT inhaler Inhale 2 puffs into the lungs every 6 (six) hours as needed. For shortness of breath   . ALPRAZolam (XANAX) 0.25 MG tablet TAKE ONE TABLET BY MOUTH TWICE DAILY AS NEEDED FOR ANXIETY   . amLODipine (NORVASC) 10 MG tablet TAKE 1 TABLET BY MOUTH ONCE DAILY   . aspirin 81 MG tablet Take 81 mg by mouth daily.   [name] atorvastatin (LIPITOR) 20 MG tablet Take 1 tablet (20 mg total) by mouth daily.   . carvedilol (COREG) 25 MG tablet TAKE 1 TABLET BY MOUTH TWICE DAILY WITH MEALS   . chlorthalidone (HYGROTON) 25 MG tablet TAKE 1 TABLET BY MOUTH ONCE DAILY   . dapagliflozin propanediol (FARXIGA) 5 MG TABS tablet Take 5 mg by mouth daily.   . ferrous sulfate 325 (65 FE) MG tablet Take 1 tablet (325 mg total) by mouth 2 (  two) times daily with a meal.   . FREESTYLE TEST STRIPS test strip USE TO TEST BLOOD SUGAR UP TO 3 TIMES A DAY   . gabapentin (NEURONTIN) 300 MG capsule TAKE 1 CAPSULE BY MOUTH THREE TIMES DAILY   . Insulin Pen Needle 31G X 5 MM MISC Use once daily with insulin   . irbesartan (AVAPRO) 300 MG tablet Take 1 tablet (300  mg total) by mouth daily.   [name] latanoprost (XALATAN) 0.005 % ophthalmic solution Place 1 drop into both eyes at bedtime.   [name] omeprazole (PRILOSEC) 40 MG capsule Take 1 capsule (40 mg total) by mouth daily.   [name] oxyCODONE-acetaminophen (PERCOCET) 7.5-325 MG tablet Take 1 tablet by mouth every 6 (six) hours as needed for severe pain.   . potassium chloride SA (K-DUR,KLOR-CON) 20 MEQ tablet Take 1 tablet (20 mEq total) by mouth 2 (two) times daily.   . Psyllium (METAMUCIL FIBER PO) Take 1 Dose by mouth 2 (two) times daily.   . SitaGLIPtin-MetFORMIN HCl (JANUMET XR) [phone] MG TB24 Take 1 tablet by mouth daily.   . TOUJEO SOLOSTAR 300 UNIT/ML SOPN INJECT 30 UNITS SUBCUTANEOUSLY ONCE DAILY   . sertraline (ZOLOFT) 100 MG tablet Take 1 tablet (100 mg total) by mouth daily. (Patient not taking: Reported on 04/21/2018) 05/03/2018: Reports not taking   No facility-administered encounter medications on file as of 05/03/2018.     Functional Status:  In your present state of health, do you have any difficulty performing the following activities: 05/03/2018 10/28/2017  Hearing? N N  Vision? N N  Difficulty concentrating or making decisions? Y N  Comment trouble remembering things -  Walking or climbing stairs? N Y  Dressing or bathing? N N  Doing errands, shopping? N N  Preparing Food and eating ? N N  Using the Toilet? N N  In the past six months, have you accidently leaked urine? N N  Do you have problems with loss of bowel control? N N  Managing your Medications? N N  Managing your Finances? N N  Housekeeping or managing your Housekeeping? N N  Some recent data might be hidden    Fall/Depression Screening: Fall Risk  05/03/2018 04/20/2018 12/15/2017  Falls in the past year? Yes Yes Yes  Number falls in past yr: 2 or more 1 1  Comment - shoveling snow -  Injury with Fall? No - Yes  Risk Factor Category  High Fall Risk - -  Risk for fall due to : History of fall(s) - -  Follow up Education  provided;Falls prevention discussed - Falls prevention discussed   PHQ 2/9 Scores 05/03/2018 04/20/2018 04/15/2018 12/15/2017 10/28/2017 06/02/2017 04/29/2016  PHQ - 2 Score 0 0 2 4 2 $ - 2  PHQ- 9 Score - - 12 9 11 $ - -  Exception Documentation - - - - - Patient refusal -    THN CM Care Plan Problem One     Most Recent Value  Care Plan Problem One  Knowledge defiecit related to self care management of diabetes  Role Documenting the Problem One  [place] for Problem One  Active  THN Long Term Goal   Patient will reduce Hgb A1C by 0.3 points in the next 90 days.  THN Long Term Goal Start Date  05/03/18  Interventions for Problem One Long Term Goal  Reviewed and discussed care plan and goals with patient, encouraged to keep and attend medical apointments, sending patient Living Well with Diabetes  Booklet, discussed goal Hgb A1C with patient, discussed goal fasting blood sugars with patient and encouraged her to discuss goal ranges with physician, reviewed medications with patient and encouraged medication compliance, [facility] Pharmacist and Pharmacist Tech working with patient for medication assistance, encouraged patient to continue to monitor blood sugars, sending 2019 Calendar Booklet to assist with documentation of appointments/blood sugars/blood pressures/weights  THN CM Short Term Goal #1   Patient will report a weight loss of 2 pounds in the next 30 days.  THN CM Short Term Goal #1 Start Date  05/03/18  Interventions for Short Term Goal #1  Congratulated patient on attending Diabetes and Nutrition class and her interest in diet changes, encouraged patient to increase activity level, encouraged patient to join gym, discussed need for patient to care for hersef and get rest and sleep, reviewed carbohydrate counting with patient, discussed ways not to get discouraged with weight loss goals  THN CM Short Term Goal #2   Patient will report no falls in the next 30 days.  THN CM Short Term Goal #2  Start Date  05/03/18  Interventions for Short Term Goal #2  Falls precautions and preventions discussed and encouraged with patient, patient encouraged to change positions very slowly, encouraged to continue to monitor blood pressures when experiencing lightheadiness and dizziness, encouraged to discuss lightheadiness and dizziness with physician, sending EMMI on Fall Prevention, sending EMMI on Getting Up from Fall     Appointments:  Patient reports attending medical appointment on 04/18/2018 and should have a follow up appointment sometime in August 2019.  Voices she still needs to arrange this appointment.  [facility].  Patient encouraged to keep and attend medical appointments.  Advanced Directives:  Patient denies having an Advanced Directive in place.  Advanced Directive Packet mailed to patient by Telephonic Coordinator few weeks ago.   Consent:  [facility] services reviewed and discussed with patient.  Patient verbally agrees to monthly telephone outreaches.  Plan: RN Health Coach will send primary MD barriers letter. RN Health Coach will route initial telephone assessment note to primary MD. [place] will send patient [place]. RN Health Coach will send patient 2019 Calendar Booklet. RN Health Coach will send patient Living Well with Diabetes Educational Package. RN Health Coach will send patient EMMI Getting Up from a Fall. RN Health Coach will send patient EMMI Preventing Falls. RN Health Coach will make next monthly outreach to patient in the month of June.   [place] [phone] Valerie [name]

## 2018-05-03 NOTE — Patient Outreach (Signed)
[facility]) Care Management  05/03/2018  Valerie [name] April 01, 1946 [id number]   Received [employer] patient assistance referral from [place] for patients Jardiance. Prepared patient portion to be mailed out and faxed provider portion to Dr. Ronnald Ramp.  Will follow up with patient in the next 7-10 business days to verify receipt of application.  [name] [place], [place] Management [phone]

## 2018-05-03 NOTE — Patient Outreach (Addendum)
[facility]) Care Management  05/03/2018  JASON HAUGE 11-20-1946 [id number]   Successful outreach call to [name] today.  HIPAA identifiers verified.   Medication assistance:  TROOP = $465 as of 04/25/2018 per [facility].    Patient reports that she has located all financial information for herself and spouse.  Based on stated income, patient is not eligible for Extra Help and has not spent enough TROOP to qualify yet for [place] or Farxixa.    She may be eligible for [employer] with Jardiance (empagliflozin) if letter of financial hardship and appeal sent in to company.  Patient agreeable to try to apply for this medication (in addition to albuterol and janumet via [employer]).    Plan: I will route patient assistance letter to pharmacy technician, Etter Sjogren, who will coordinate application process for Jardiance through [facility].  She will assist with obtaining all pertinent documents from both patient and Dr. Ronnald Ramp and submit application once completed.    Ralene Bathe, PharmD, [place] [phone]

## 2018-05-10 ENCOUNTER — Other Ambulatory Visit: Payer: Self-pay | Admitting: Pharmacy Technician

## 2018-05-10 NOTE — Patient Outreach (Signed)
[facility]) Care Management  05/10/2018  SARAHI BORLAND [birth year] [id number]  Received provider portion of Merck application for patients Janumet XR and Proventil HFA from Dr. Ronnald Ramp. Prepared completed applications to be mailed out.  Will check application status with [employer] in 10-14 business days.  [name] [place], [place] Management [phone]

## 2018-05-16 ENCOUNTER — Other Ambulatory Visit: Payer: Self-pay | Admitting: Internal Medicine

## 2018-05-16 DIAGNOSIS — E118 Type 2 diabetes mellitus with unspecified complications: Secondary | ICD-10-CM

## 2018-05-16 DIAGNOSIS — I10 Essential (primary) hypertension: Secondary | ICD-10-CM

## 2018-05-16 DIAGNOSIS — K2100 Gastro-esophageal reflux disease with esophagitis, without bleeding: Secondary | ICD-10-CM

## 2018-05-16 DIAGNOSIS — J019 Acute sinusitis, unspecified: Secondary | ICD-10-CM | POA: Diagnosis not present

## 2018-05-16 DIAGNOSIS — L03211 Cellulitis of face: Secondary | ICD-10-CM | POA: Diagnosis not present

## 2018-05-18 ENCOUNTER — Encounter: Payer: Self-pay | Admitting: Internal Medicine

## 2018-05-18 ENCOUNTER — Telehealth: Payer: Self-pay

## 2018-05-18 ENCOUNTER — Ambulatory Visit (INDEPENDENT_AMBULATORY_CARE_PROVIDER_SITE_OTHER): Payer: PPO | Admitting: Internal Medicine

## 2018-05-18 VITALS — BP 126/88 | HR 82 | Temp 98.3°F | Ht 66.0 in | Wt 255.0 lb

## 2018-05-18 DIAGNOSIS — L03211 Cellulitis of face: Secondary | ICD-10-CM

## 2018-05-18 DIAGNOSIS — J019 Acute sinusitis, unspecified: Secondary | ICD-10-CM | POA: Diagnosis not present

## 2018-05-18 DIAGNOSIS — Z794 Long term (current) use of insulin: Secondary | ICD-10-CM | POA: Diagnosis not present

## 2018-05-18 DIAGNOSIS — J4520 Mild intermittent asthma, uncomplicated: Secondary | ICD-10-CM

## 2018-05-18 DIAGNOSIS — E118 Type 2 diabetes mellitus with unspecified complications: Secondary | ICD-10-CM

## 2018-05-18 DIAGNOSIS — I10 Essential (primary) hypertension: Secondary | ICD-10-CM

## 2018-05-18 MED ORDER — PREDNISONE 10 MG PO TABS: ORAL_TABLET | ORAL | 0 refills | Status: DC

## 2018-05-18 MED ORDER — AMOXICILLIN-POT CLAVULANATE 875-125 MG PO TABS: 1.0000 | ORAL_TABLET | Freq: Two times a day (BID) | ORAL | 0 refills | Status: DC

## 2018-05-18 MED ORDER — METHYLPREDNISOLONE ACETATE 80 MG/ML IJ SUSP
80.0000 mg | Freq: Once | INTRAMUSCULAR | Status: AC
  Administered 2018-05-18: 80 mg via INTRAMUSCULAR

## 2018-05-18 NOTE — Assessment & Plan Note (Signed)
Mild to mod, for change antibx to augmentin,  to f/u any worsening symptoms or concerns

## 2018-05-18 NOTE — Progress Notes (Signed)
Subjective:    Patient ID: Valerie [name], female    DOB: 04/04/1946, 72 y.o.   MRN: [id number]  HPI   Here with 5 days acute onset fever, facial pain with nasal erythema/swelling/tender, pressure, headache, general weakness and malaise, and greenish d/c, with mild ST and cough, but pt denies chest pain, wheezing, increased sob or doe, orthopnea, PND, increased LE swelling, palpitations, dizziness or syncope.  Seen and tx at [facility] June 3 and tx with cleocin and improved nasal redness but sinus pain remains quite mod to severe, despite also using flonase and mucinex D.   Pt denies polydipsia, polyuria.   Past Medical History:  Diagnosis Date  . Anemia   . Anxiety and depression   . Asthma   . Degenerative arthritis   . Depression   . Diabetes mellitus, type 2 ([place])   . Fatigue   . GERD (gastroesophageal reflux disease)   . Glaucoma   . Hemorrhoids   . Hyperlipidemia   . Hypertension   . Hypothyroid   . Hypoxemia 11/24/2013  . Memory deficit 10/04/2013  . Obesity   . Sleep apnea   . Snoring disorder    Past Surgical History:  Procedure Laterality Date  . benign tumors resected    . CATARACT EXTRACTION Left   . FOOT SURGERY Left    bone spur  . REFRACTIVE SURGERY     Glaucoma  . ROTATOR CUFF REPAIR    . TUBAL LIGATION      reports that she has never smoked. She has never used smokeless tobacco. She reports that she does not drink alcohol or use drugs. family history includes Alcohol abuse in her mother and unknown relative; Atrial fibrillation in her sister; Coronary artery disease in her brother; Heart attack in her father and mother; Heart disease in her sister; Hypertension in her sister and unknown relative. Allergies  Allergen Reactions  . Food Swelling    bananas  . Penicillins Swelling and Rash   Current Outpatient Medications on File Prior to Visit  Medication Sig Dispense Refill  . albuterol (PROVENTIL) (2.5 MG/3ML) 0.083% nebulizer solution     . albuterol  (VENTOLIN HFA) 108 (90 Base) MCG/ACT inhaler Inhale 2 puffs into the lungs every 6 (six) hours as needed. For shortness of breath 3 Inhaler 4  . ALPRAZolam (XANAX) 0.25 MG tablet TAKE ONE TABLET BY MOUTH TWICE DAILY AS NEEDED FOR ANXIETY 75 tablet 5  . amLODipine (NORVASC) 10 MG tablet TAKE 1 TABLET BY MOUTH ONCE DAILY 90 tablet 1  . amLODipine (NORVASC) 10 MG tablet TAKE 1 TABLET BY MOUTH ONCE DAILY 90 tablet 1  . aspirin 81 MG tablet Take 81 mg by mouth daily.    [name] atorvastatin (LIPITOR) 20 MG tablet TAKE 1 TABLET BY MOUTH ONCE DAILY 90 tablet 1  . carvedilol (COREG) 25 MG tablet TAKE 1 TABLET BY MOUTH TWICE DAILY WITH MEALS 180 tablet 1  . chlorthalidone (HYGROTON) 25 MG tablet TAKE 1 TABLET BY MOUTH ONCE DAILY 90 tablet 1  . dapagliflozin propanediol (FARXIGA) 5 MG TABS tablet Take 5 mg by mouth daily. 90 tablet 1  . ferrous sulfate 325 (65 FE) MG tablet Take 1 tablet (325 mg total) by mouth 2 (two) times daily with a meal. 180 tablet 1  . FREESTYLE TEST STRIPS test strip USE TO TEST BLOOD SUGAR UP TO 3 TIMES A DAY 100 each 11  . gabapentin (NEURONTIN) 300 MG capsule TAKE 1 CAPSULE BY MOUTH THREE TIMES DAILY  270 capsule 1  . Insulin Pen Needle 31G X 5 MM MISC Use once daily with insulin 100 each 3  . irbesartan (AVAPRO) 300 MG tablet TAKE 1 TABLET BY MOUTH ONCE DAILY 90 tablet 1  . latanoprost (XALATAN) 0.005 % ophthalmic solution Place 1 drop into both eyes at bedtime.    [name] omeprazole (PRILOSEC) 40 MG capsule TAKE 1 CAPSULE BY MOUTH ONCE DAILY 90 capsule 1  . oxyCODONE-acetaminophen (PERCOCET) 7.5-325 MG tablet Take 1 tablet by mouth every 6 (six) hours as needed for severe pain. 90 tablet 0  . potassium chloride SA (K-DUR,KLOR-CON) 20 MEQ tablet Take 1 tablet (20 mEq total) by mouth 2 (two) times daily. 180 tablet 1  . Psyllium (METAMUCIL FIBER PO) Take 1 Dose by mouth 2 (two) times daily.    . sertraline (ZOLOFT) 100 MG tablet Take 1 tablet (100 mg total) by mouth daily. 90 tablet 3  .  SitaGLIPtin-MetFORMIN HCl (JANUMET XR) [phone] MG TB24 Take 1 tablet by mouth daily. 90 tablet 1  . TOUJEO SOLOSTAR 300 UNIT/ML SOPN INJECT 30 UNITS SUBCUTANEOUSLY ONCE DAILY 6 mL 5   No current facility-administered medications on file prior to visit.    Review of Systems  Constitutional: Negative for other unusual diaphoresis or sweats HENT: Negative for ear discharge or swelling Eyes: Negative for other worsening visual disturbances Respiratory: Negative for stridor or other swelling  Gastrointestinal: Negative for worsening distension or other blood Genitourinary: Negative for retention or other urinary change Musculoskeletal: Negative for other MSK pain or swelling Skin: Negative for color change or other new lesions Neurological: Negative for worsening tremors and other numbness  Psychiatric/Behavioral: Negative for worsening agitation or other fatigue All other system neg per pt    Objective:   Physical Exam BP 126/88   Pulse 82   Temp 98.3 F (36.8 C) (Oral)   Ht 5\' 6"  (1.676 m)   Wt 255 lb (115.7 kg)   SpO2 96%   BMI 41.16 kg/m  VS noted,  Constitutional: Pt appears in NAD HENT: Head: NCAT.  Right Ear: External ear normal.  Left Ear: External ear normal.  Eyes: . Pupils are equal, round, and reactive to light. Conjunctivae and EOM are normal Bilat tm's with mild erythema.  Max sinus areas mild tender.  Pharynx with mild erythema, no exudate Nose: without d/c or deformity or erythema but persistent tender slight swelling Neck: Neck supple. Gross normal ROM Cardiovascular: Normal rate and regular rhythm.   Pulmonary/Chest: Effort normal and breath sounds without rales or wheezing.  Abd:  Soft, NT, ND, + BS, no organomegaly Neurological: Pt is alert. At baseline orientation, motor grossly intact Skin: Skin is warm. No rashes, other new lesions, no LE edema Psychiatric: Pt behavior is normal without agitation , mild nervous No other exam findings  Lab Results    Component Value Date   WBC 11.2 (H) 04/18/2018   HGB 10.3 (L) 04/18/2018   HCT 31.4 (L) 04/18/2018   PLT 357.0 04/18/2018   GLUCOSE 239 (H) 03/24/2018   CHOL 135 10/06/2017   TRIG 261.0 (H) 10/06/2017   HDL 46.30 10/06/2017   LDLDIRECT 55.0 10/06/2017   LDLCALC 43 03/28/2014   ALT 22 03/24/2018   AST 19 03/24/2018   NA 137 03/24/2018   K 3.2 (L) 03/24/2018   CL 97 03/24/2018   CREATININE 0.97 03/24/2018   BUN 19 03/24/2018   CO2 31 03/24/2018   TSH 2.45 03/24/2018   INR 1.06 08/29/2015   HGBA1C 7.7 (  H) 03/24/2018   MICROALBUR <0.7 03/24/2017        Assessment & Plan:

## 2018-05-18 NOTE — Telephone Encounter (Signed)
Fax received stating that Irbesartan is not available.  Sent request asking what is available that their location.

## 2018-05-18 NOTE — Assessment & Plan Note (Signed)
stable overall by history and exam, and pt to continue medical treatment as before,  to f/u any worsening symptoms or concerns 

## 2018-05-18 NOTE — Assessment & Plan Note (Signed)
Lab Results  Component Value Date   HGBA1C 7.7 (H) 03/24/2018  stable overall by history and exam, recent data reviewed with pt, and pt to continue medical treatment as before,  to f/u any worsening symptoms or concerns

## 2018-05-18 NOTE — Assessment & Plan Note (Signed)
Improved, cont antibx as above

## 2018-05-18 NOTE — Patient Instructions (Addendum)
You had the steroid shot today  Ok to stop the cleocin  Please take all new medication as prescribed - the antibiotic, and prednisone  Please continue all other medications as before, including the flonase and mucinex D  Please have the pharmacy call with any other refills you may need.  Please keep your appointments with your specialists as you may have planned

## 2018-05-18 NOTE — Assessment & Plan Note (Signed)
stable overall by history and exam, recent data reviewed with pt, and pt to continue medical treatment as before,  to f/u any worsening symptoms or concerns BP Readings from Last 3 Encounters:  05/18/18 126/88  04/18/18 (!) 150/60  03/24/18 134/68

## 2018-05-19 ENCOUNTER — Other Ambulatory Visit: Payer: Self-pay | Admitting: Internal Medicine

## 2018-05-19 ENCOUNTER — Other Ambulatory Visit: Payer: Self-pay | Admitting: Pharmacy Technician

## 2018-05-19 MED ORDER — LEVOFLOXACIN 500 MG PO TABS: 500.0000 mg | ORAL_TABLET | Freq: Every day | ORAL | 0 refills | Status: DC

## 2018-05-19 NOTE — Patient Outreach (Signed)
[place] [facility]) Care Management  05/19/2018  Valerie [name] June 25, 1946 [id number]   Received call from [facility] RPh Ralene Bathe that patient had questions about [employer] application that was mailed to her.  Successful outreach call to patient, HIPAA identifiers verified. [name] and I talked through filling out required questions on application and I also informed her that I would need her to send in her and her spouses proof of income. [name] stated that she is incredibly under the weather and does not know when she will be able to go and make copies of their proof on income. I informed her that if she wanted to mail her originals in, I have no problem making copies and sending her originals back to her.  Once I receive patient portion of application, I will submit completed application and required documents to [place].   [name] [place], [place] Management [phone]

## 2018-05-20 DIAGNOSIS — G4733 Obstructive sleep apnea (adult) (pediatric): Secondary | ICD-10-CM | POA: Diagnosis not present

## 2018-05-23 ENCOUNTER — Other Ambulatory Visit: Payer: Self-pay | Admitting: Internal Medicine

## 2018-05-23 ENCOUNTER — Other Ambulatory Visit: Payer: Self-pay | Admitting: Pharmacy Technician

## 2018-05-23 DIAGNOSIS — I10 Essential (primary) hypertension: Secondary | ICD-10-CM

## 2018-05-23 DIAGNOSIS — E118 Type 2 diabetes mellitus with unspecified complications: Secondary | ICD-10-CM

## 2018-05-23 DIAGNOSIS — Z794 Long term (current) use of insulin: Secondary | ICD-10-CM

## 2018-05-23 DIAGNOSIS — K641 Second degree hemorrhoids: Secondary | ICD-10-CM | POA: Diagnosis not present

## 2018-05-23 MED ORDER — OLMESARTAN MEDOXOMIL 40 MG PO TABS: 40.0000 mg | ORAL_TABLET | Freq: Every day | ORAL | 1 refills | Status: DC

## 2018-05-23 NOTE — Telephone Encounter (Signed)
Left detailed message that olmesartan has been sent in to replace the irbesartan that is on back order.

## 2018-05-23 NOTE — Patient Outreach (Signed)
[place] [facility]) Care Management  05/23/2018  Valerie [name] 04-22-1946 [id number]   Contacted [employer] patient assistance to follow up on patients application for Janumet and Proventil. Spoke to [place] who stated application had been received and attestation letter and original application was mailed out to patient on 06/06.  Unsuccessful outreach call attempt #1 to patient in regards to [employer]. HIPAA compliant voicemail left.  Will make 2nd call attempt in 2-3 business days if call not returned.  [name] [place], [place] Management [phone]

## 2018-05-23 NOTE — Telephone Encounter (Signed)
changed

## 2018-05-23 NOTE — Telephone Encounter (Signed)
Irbesartan is on back order: I received a fax that the alternatives they have in stock is the: Losartan, Olmesartan, Telmisartan, Valsartan and Candesartan.   Please advise

## 2018-05-26 ENCOUNTER — Ambulatory Visit: Payer: Self-pay | Admitting: *Deleted

## 2018-05-27 ENCOUNTER — Other Ambulatory Visit: Payer: Self-pay | Admitting: *Deleted

## 2018-05-27 ENCOUNTER — Encounter: Payer: Self-pay | Admitting: *Deleted

## 2018-05-27 NOTE — Patient Outreach (Addendum)
[facility]) Care Management  [place]  05/27/2018   Valerie [name] 09-13-1946 [id number]   [place] Monthly Outreach  Referral Date:  04/15/2018 Referral Source:  Nurse Call Line Screening Reason for Referral:  Disease Management Education Insurance:  [employer]   Outreach Attempt:  Successful telephone outreach to patient for monthly follow up.  HIPAA verified with patient.  Patient verbalizing she has been ill recently with sinus infection and cellulitis of the face.  Had an Urgent Care Visit and visit with Physician at her primary care office.  Has completed her last round of antibiotics and steroids.  States she feels a lot better, but not 100%.  Denies any current fever, chills, or coughing currently.  Blood sugar this morning was 145 with fasting ranges of 130-160's.  Denies any hypo or hyperglycemic episodes.  Patient reports she underwent lysis of bleeding hemorrhoids on 6/0/2019.  Denies any bleeding in stool currently.  Encounter Medications:  Outpatient Encounter Medications as of 05/27/2018  Medication Sig Note  . albuterol (PROVENTIL) (2.5 MG/3ML) 0.083% nebulizer solution  04/16/2016: Received from: External Pharmacy  . albuterol (VENTOLIN HFA) 108 (90 Base) MCG/ACT inhaler Inhale 2 puffs into the lungs every 6 (six) hours as needed. For shortness of breath   . ALPRAZolam (XANAX) 0.25 MG tablet TAKE ONE TABLET BY MOUTH TWICE DAILY AS NEEDED FOR ANXIETY   . amLODipine (NORVASC) 10 MG tablet TAKE 1 TABLET BY MOUTH ONCE DAILY   . aspirin 81 MG tablet Take 81 mg by mouth daily.   [name] atorvastatin (LIPITOR) 20 MG tablet TAKE 1 TABLET BY MOUTH ONCE DAILY   . carvedilol (COREG) 25 MG tablet TAKE 1 TABLET BY MOUTH TWICE DAILY WITH MEALS   . chlorthalidone (HYGROTON) 25 MG tablet TAKE 1 TABLET BY MOUTH ONCE DAILY   . dapagliflozin propanediol (FARXIGA) 5 MG TABS tablet Take 5 mg by mouth daily.   . ferrous sulfate 325 (65 FE) MG tablet Take 1  tablet (325 mg total) by mouth 2 (two) times daily with a meal.   . FREESTYLE TEST STRIPS test strip USE TO TEST BLOOD SUGAR UP TO 3 TIMES A DAY   . gabapentin (NEURONTIN) 300 MG capsule TAKE 1 CAPSULE BY MOUTH THREE TIMES DAILY   . Insulin Pen Needle 31G X 5 MM MISC Use once daily with insulin   . latanoprost (XALATAN) 0.005 % ophthalmic solution Place 1 drop into both eyes at bedtime.   [name] olmesartan (BENICAR) 40 MG tablet Take 1 tablet (40 mg total) by mouth daily.   [name] omeprazole (PRILOSEC) 40 MG capsule TAKE 1 CAPSULE BY MOUTH ONCE DAILY   . oxyCODONE-acetaminophen (PERCOCET) 7.5-325 MG tablet Take 1 tablet by mouth every 6 (six) hours as needed for severe pain.   . potassium chloride SA (K-DUR,KLOR-CON) 20 MEQ tablet Take 1 tablet (20 mEq total) by mouth 2 (two) times daily.   . Psyllium (METAMUCIL FIBER PO) Take 1 Dose by mouth 2 (two) times daily.   . SitaGLIPtin-MetFORMIN HCl (JANUMET XR) [phone] MG TB24 Take 1 tablet by mouth daily.   . TOUJEO SOLOSTAR 300 UNIT/ML SOPN INJECT 30 UNITS SUBCUTANEOUSLY ONCE DAILY   . amLODipine (NORVASC) 10 MG tablet TAKE 1 TABLET BY MOUTH ONCE DAILY 6/75/4492: Duplicate  . sertraline (ZOLOFT) 100 MG tablet Take 1 tablet (100 mg total) by mouth daily. (Patient not taking: Reported on 05/27/2018) 05/03/2018: Reports not taking   No facility-administered encounter medications on file as of  05/27/2018.     Functional Status:  In your present state of health, do you have any difficulty performing the following activities: 05/03/2018 10/28/2017  Hearing? N N  Vision? N N  Difficulty concentrating or making decisions? Y N  Comment trouble remembering things -  Walking or climbing stairs? N Y  Dressing or bathing? N N  Doing errands, shopping? N N  Preparing Food and eating ? N N  Using the Toilet? N N  In the past six months, have you accidently leaked urine? N N  Do you have problems with loss of bowel control? N N  Managing your Medications? N N   Managing your Finances? N N  Housekeeping or managing your Housekeeping? N N  Some recent data might be hidden    Fall/Depression Screening: Fall Risk  05/03/2018 04/20/2018 12/15/2017  Falls in the past year? Yes Yes Yes  Number falls in past yr: 2 or more 1 1  Comment - shoveling snow -  Injury with Fall? No - Yes  Risk Factor Category  High Fall Risk - -  Risk for fall due to : History of fall(s) - -  Follow up Education provided;Falls prevention discussed - Falls prevention discussed   PHQ 2/9 Scores 05/03/2018 04/20/2018 04/15/2018 12/15/2017 10/28/2017 06/02/2017 04/29/2016  PHQ - 2 Score 0 0 _0 - 2  PHQ- 9 Score - - _1 - -  Exception Documentation - - - - - Patient refusal -    THN CM Care Plan Problem One     Most Recent Value  Care Plan Problem One  Knowledge defiecit related to self care management of diabetes  Role Documenting the Problem One  [place] for Problem One  Active  THN Long Term Goal   Patient will reduce Hgb A1C by 0.3 points in the next 90 days.  THN Long Term Goal Start Date  05/03/18  Interventions for Problem One Long Term Goal  Current care plan and goals reviewed and discussed with patient, encouraged to make medical follow up appointment, encouraged to keep and attend appointment with diabetes and nutrition class, encouraged patient to discuss goal fasting blood sugar ranges with primary care physician, Resending Living Well with Diabetes education packet and 2019 Calendar booklet to help with recording of blood sugars  THN CM Short Term Goal #1   Patient will report recieiving Diabetes Educational material in the next 30 days.  THN CM Short Term Goal #1 Start Date  05/27/18  [facility] CM Short Term Goal #1 Met Date  05/27/18  Interventions for Short Term Goal #1  Resending 2019 Calendar booklet and Living Well with Diabetes Education Packet for patient to review, encouraged patient to contact [place] if she does not recieve packet in the  next 2 weeks, encouraged patient to review educational material and utilize Calendar to keep medical appointments and record weights and blood sugars  THN CM Short Term Goal #2   Patient will report no more than a 5 pound weight gain in the next 30 days.  THN CM Short Term Goal #2 Start Date  05/27/18  [facility] CM Short Term Goal #2 Met Date  05/27/18  Interventions for Short Term Goal #2  Congratulated patient on weight loss, discussed weight loss related to being sick and decrease in appetite, encouraged patient not to get discouraged if she gains some of the weight back once her appetite gets back to normal, encouraged patient to  increase activity slowly, discussed healthier meal options      Appointments:  Patient last saw provider at primary care on 05/18/2018.  States she does not have a follow up appointment scheduled yet, but will schedule for follow up to be in August 2019.  [facility] on 06/08/2018.  Plan: RN Health Coach will resend patient [facility] RN Health Coach will resend patient 2019 Calendar Booklet. RN Health Coach will resend patient Living Well with Diabetes Educational Package. RN Health Coach will send primary care provider Quarterly Update.  [place] [phone] [email or web address]

## 2018-06-08 ENCOUNTER — Other Ambulatory Visit: Payer: Self-pay | Admitting: Pharmacy Technician

## 2018-06-08 ENCOUNTER — Ambulatory Visit: Payer: PPO | Admitting: *Deleted

## 2018-06-08 NOTE — Patient Outreach (Addendum)
[facility]) Care Management  06/08/2018  SURI TAFOLLA 12-20-1945 [id number]   Follow up call to [employer] patient assistance to confirm attestation letter was received. Marisol stated that attestation letter was received and that patient is approved for Janumet XR and Proventil as of 06/19 to 12/13/18. She also stated that the pharmacy has started processing the shipment as of 06/24.  Will follow up with pharmacy in the next 3-4 business days  [name]. [place], [facility] Management [phone]

## 2018-06-18 DIAGNOSIS — B029 Zoster without complications: Secondary | ICD-10-CM | POA: Diagnosis not present

## 2018-06-20 ENCOUNTER — Other Ambulatory Visit: Payer: Self-pay | Admitting: Pharmacy Technician

## 2018-06-20 ENCOUNTER — Encounter: Payer: Self-pay | Admitting: Internal Medicine

## 2018-06-20 ENCOUNTER — Other Ambulatory Visit (INDEPENDENT_AMBULATORY_CARE_PROVIDER_SITE_OTHER): Payer: PPO

## 2018-06-20 ENCOUNTER — Ambulatory Visit (INDEPENDENT_AMBULATORY_CARE_PROVIDER_SITE_OTHER): Payer: PPO | Admitting: Internal Medicine

## 2018-06-20 ENCOUNTER — Telehealth: Payer: Self-pay | Admitting: Internal Medicine

## 2018-06-20 VITALS — BP 130/70 | HR 80 | Temp 97.8°F | Ht 66.0 in | Wt 258.0 lb

## 2018-06-20 DIAGNOSIS — B029 Zoster without complications: Secondary | ICD-10-CM | POA: Diagnosis not present

## 2018-06-20 DIAGNOSIS — D72829 Elevated white blood cell count, unspecified: Secondary | ICD-10-CM

## 2018-06-20 DIAGNOSIS — Z794 Long term (current) use of insulin: Secondary | ICD-10-CM

## 2018-06-20 DIAGNOSIS — Z6841 Body Mass Index (BMI) 40.0 and over, adult: Secondary | ICD-10-CM

## 2018-06-20 DIAGNOSIS — E118 Type 2 diabetes mellitus with unspecified complications: Secondary | ICD-10-CM

## 2018-06-20 DIAGNOSIS — D508 Other iron deficiency anemias: Secondary | ICD-10-CM

## 2018-06-20 DIAGNOSIS — M48061 Spinal stenosis, lumbar region without neurogenic claudication: Secondary | ICD-10-CM

## 2018-06-20 DIAGNOSIS — Z79891 Long term (current) use of opiate analgesic: Secondary | ICD-10-CM

## 2018-06-20 DIAGNOSIS — I10 Essential (primary) hypertension: Secondary | ICD-10-CM

## 2018-06-20 LAB — MICROALBUMIN / CREATININE URINE RATIO
Creatinine,U: 55.2 mg/dL
Microalb Creat Ratio: 1.3 mg/g (ref 0.0–30.0)
Microalb, Ur: <0.7 mg/dL (ref 0.0–1.9)

## 2018-06-20 LAB — CBC WITH DIFFERENTIAL/PLATELET
Basophils Absolute: 0.1 10*3/uL (ref 0.0–0.1)
Basophils Relative: 0.7 % (ref 0.0–3.0)
Eosinophils Absolute: 0.5 10*3/uL (ref 0.0–0.7)
Eosinophils Relative: 3.4 % (ref 0.0–5.0)
HCT: 31.6 % — ABNORMAL LOW (ref 36.0–46.0)
Hemoglobin: 10.4 g/dL — ABNORMAL LOW (ref 12.0–15.0)
Lymphocytes Relative: 22.7 % (ref 12.0–46.0)
Lymphs Abs: 3 10*3/uL (ref 0.7–4.0)
MCHC: 32.8 g/dL (ref 30.0–36.0)
MCV: 86.4 fl (ref 78.0–100.0)
Monocytes Absolute: 1 10*3/uL (ref 0.1–1.0)
Monocytes Relative: 7.7 % (ref 3.0–12.0)
Neutro Abs: 8.8 10*3/uL — ABNORMAL HIGH (ref 1.4–7.7)
Neutrophils Relative %: 65.5 % (ref 43.0–77.0)
Platelets: 325 10*3/uL (ref 150.0–400.0)
RBC: 3.65 Mil/uL — ABNORMAL LOW (ref 3.87–5.11)
RDW: 16.4 % — ABNORMAL HIGH (ref 11.5–15.5)
WBC: 13.4 10*3/uL — ABNORMAL HIGH (ref 4.0–10.5)

## 2018-06-20 LAB — URINALYSIS, ROUTINE W REFLEX MICROSCOPIC
Bilirubin Urine: NEGATIVE
Hgb urine dipstick: NEGATIVE
Ketones, ur: NEGATIVE
Leukocytes, UA: NEGATIVE
Nitrite: NEGATIVE
RBC / HPF: NONE SEEN (ref 0–?)
Specific Gravity, Urine: 1.01 (ref 1.000–1.030)
Total Protein, Urine: NEGATIVE
Urine Glucose: NEGATIVE
Urobilinogen, UA: 0.2 (ref 0.0–1.0)
pH: 6 (ref 5.0–8.0)

## 2018-06-20 LAB — BASIC METABOLIC PANEL
BUN: 14 mg/dL (ref 6–23)
CO2: 28 mEq/L (ref 19–32)
Calcium: 9.5 mg/dL (ref 8.4–10.5)
Chloride: 100 mEq/L (ref 96–112)
Creatinine, Ser: 0.85 mg/dL (ref 0.40–1.20)
GFR: 84.96 mL/min (ref >60.00)
Glucose, Bld: 118 mg/dL — ABNORMAL HIGH (ref 70–99)
Potassium: 4 mEq/L (ref 3.5–5.1)
Sodium: 138 mEq/L (ref 135–145)

## 2018-06-20 LAB — TSH: TSH: 2.33 u[IU]/mL (ref 0.35–4.50)

## 2018-06-20 LAB — HM DIABETES FOOT EXAM

## 2018-06-20 LAB — HEMOGLOBIN A1C: Hgb A1c MFr Bld: 7.2 % — ABNORMAL HIGH (ref 4.6–6.5)

## 2018-06-20 MED ORDER — NALOXONE HCL 4 MG/0.1ML NA LIQD: 1.0000 | Freq: Once | NASAL | 2 refills | Status: AC

## 2018-06-20 MED ORDER — OXYCODONE-ACETAMINOPHEN 7.5-325 MG PO TABS: 1.0000 | ORAL_TABLET | Freq: Four times a day (QID) | ORAL | 0 refills | Status: DC | PRN

## 2018-06-20 NOTE — Progress Notes (Signed)
Subjective:  Patient ID: Valerie [name], female    DOB: Feb 18, 1946  Age: 72 y.o. MRN: [id number]  CC: Rash; Back Pain; and Diabetes   HPI Valerie [name] presents for f/up - She developed a painful rash on her right lower back about 5 days ago.  She subsequently saw another provider and was placed on Valtrex and is getting much better.  She is controlling the pain with Percocet.  She complains of mild itching at the site but no redness, swelling, or drainage.  She is using topical Benadryl for the itching.  Outpatient Medications Prior to Visit  Medication Sig Dispense Refill  . albuterol (PROVENTIL) (2.5 MG/3ML) 0.083% nebulizer solution     . albuterol (VENTOLIN HFA) 108 (90 Base) MCG/ACT inhaler Inhale 2 puffs into the lungs every 6 (six) hours as needed. For shortness of breath 3 Inhaler 4  . ALPRAZolam (XANAX) 0.25 MG tablet TAKE ONE TABLET BY MOUTH TWICE DAILY AS NEEDED FOR ANXIETY 75 tablet 5  . amLODipine (NORVASC) 10 MG tablet TAKE 1 TABLET BY MOUTH ONCE DAILY 90 tablet 1  . aspirin 81 MG tablet Take 81 mg by mouth daily.    [name] atorvastatin (LIPITOR) 20 MG tablet TAKE 1 TABLET BY MOUTH ONCE DAILY 90 tablet 1  . carvedilol (COREG) 25 MG tablet TAKE 1 TABLET BY MOUTH TWICE DAILY WITH MEALS 180 tablet 1  . chlorthalidone (HYGROTON) 25 MG tablet TAKE 1 TABLET BY MOUTH ONCE DAILY 90 tablet 1  . dapagliflozin propanediol (FARXIGA) 5 MG TABS tablet Take 5 mg by mouth daily. 90 tablet 1  . ferrous sulfate 325 (65 FE) MG tablet Take 1 tablet (325 mg total) by mouth 2 (two) times daily with a meal. 180 tablet 1  . FREESTYLE TEST STRIPS test strip USE TO TEST BLOOD SUGAR UP TO 3 TIMES A DAY 100 each 11  . gabapentin (NEURONTIN) 300 MG capsule TAKE 1 CAPSULE BY MOUTH THREE TIMES DAILY 270 capsule 1  . Insulin Pen Needle 31G X 5 MM MISC Use once daily with insulin 100 each 3  . latanoprost (XALATAN) 0.005 % ophthalmic solution Place 1 drop into both eyes at bedtime.    [name] olmesartan (BENICAR)  40 MG tablet Take 1 tablet (40 mg total) by mouth daily. 90 tablet 1  . omeprazole (PRILOSEC) 40 MG capsule TAKE 1 CAPSULE BY MOUTH ONCE DAILY 90 capsule 1  . potassium chloride SA (K-DUR,KLOR-CON) 20 MEQ tablet Take 1 tablet (20 mEq total) by mouth 2 (two) times daily. 180 tablet 1  . Psyllium (METAMUCIL FIBER PO) Take 1 Dose by mouth 2 (two) times daily.    . sertraline (ZOLOFT) 100 MG tablet Take 1 tablet (100 mg total) by mouth daily. 90 tablet 3  . SitaGLIPtin-MetFORMIN HCl (JANUMET XR) [phone] MG TB24 Take 1 tablet by mouth daily. 90 tablet 1  . TOUJEO SOLOSTAR 300 UNIT/ML SOPN INJECT 30 UNITS SUBCUTANEOUSLY ONCE DAILY 6 mL 5  . valACYclovir (VALTREX) 1000 MG tablet Take 1,000 mg by mouth 3 (three) times daily with meals.  0  . oxyCODONE-acetaminophen (PERCOCET) 7.5-325 MG tablet Take 1 tablet by mouth every 6 (six) hours as needed for severe pain. 90 tablet 0  . amLODipine (NORVASC) 10 MG tablet TAKE 1 TABLET BY MOUTH ONCE DAILY 90 tablet 1   No facility-administered medications prior to visit.     ROS Review of Systems  Constitutional: Negative.  Negative for appetite change, diaphoresis, fatigue and unexpected weight change.  HENT:  Negative.   Eyes: Negative.   Respiratory: Negative.  Negative for cough, chest tightness, shortness of breath and wheezing.   Cardiovascular: Negative.  Negative for chest pain, palpitations and leg swelling.  Gastrointestinal: Negative for abdominal pain, constipation, diarrhea, nausea and vomiting.  Endocrine: Negative for cold intolerance and heat intolerance.  Genitourinary: Negative.  Negative for difficulty urinating, dysuria, frequency and hematuria.  Musculoskeletal: Positive for arthralgias and back pain.  Skin: Positive for rash. Negative for color change.  Allergic/Immunologic: Negative.   Neurological: Negative.  Negative for dizziness and weakness.  Hematological: Negative for adenopathy. Does not bruise/bleed easily.    Psychiatric/Behavioral: Negative.     Objective:  BP 130/70 (BP Location: Left Arm, Patient Position: Sitting, Cuff Size: Large)   Pulse 80   Temp 97.8 F (36.6 C) (Oral)   Ht _0  (1.676 m)   Wt 258 lb (117 kg)   SpO2 98%   BMI 41.64 kg/m   BP Readings from Last 3 Encounters:  06/20/18 130/70  05/18/18 126/88  04/18/18 (!) 150/60    Wt Readings from Last 3 Encounters:  06/20/18 258 lb (117 kg)  05/18/18 255 lb (115.7 kg)  04/20/18 263 lb (119.3 kg)    Physical Exam  Constitutional: She is oriented to person, place, and time. No distress.  HENT:  Mouth/Throat: Oropharynx is clear and moist. No oropharyngeal exudate.  Eyes: Conjunctivae are normal. No scleral icterus.  Neck: Normal range of motion. Neck supple. No JVD present. No thyromegaly present.  Cardiovascular: Normal rate and regular rhythm. Exam reveals no friction rub.  No murmur heard. Pulmonary/Chest: Effort normal and breath sounds normal. She has no wheezes. She has no rhonchi. She has no rales.  Abdominal: Soft. Bowel sounds are normal. She exhibits no mass. There is no hepatosplenomegaly. There is no tenderness. No hernia.  Musculoskeletal: Normal range of motion. She exhibits no edema, tenderness or deformity.  Lymphadenopathy:    She has no cervical adenopathy.  Neurological: She is alert and oriented to person, place, and time.  Skin: Skin is warm and dry. Rash noted. Rash is vesicular. Rash is not papular, not nodular and not pustular. She is not diaphoretic. No pallor.     Vitals reviewed.   Lab Results  Component Value Date   WBC 13.4 (H) 06/20/2018   HGB 10.4 (L) 06/20/2018   HCT 31.6 (L) 06/20/2018   PLT 325.0 06/20/2018   GLUCOSE 118 (H) 06/20/2018   CHOL 135 10/06/2017   TRIG 261.0 (H) 10/06/2017   HDL 46.30 10/06/2017   LDLDIRECT 55.0 10/06/2017   LDLCALC 43 03/28/2014   ALT 22 03/24/2018   AST 19 03/24/2018   NA 138 06/20/2018   K 4.0 06/20/2018   CL 100 06/20/2018    CREATININE 0.85 06/20/2018   BUN 14 06/20/2018   CO2 28 06/20/2018   TSH 2.33 06/20/2018   INR 1.06 08/29/2015   HGBA1C 7.2 (H) 06/20/2018   MICROALBUR <0.7 06/20/2018    Dg Chest 2 View  Result Date: 04/16/2016 CLINICAL DATA:  Recent diagnosis of pneumonia. Coughing and wheezing and shortness of breath for 2 weeks. History of hypertension. EXAM: CHEST  2 VIEW COMPARISON:  08/29/2015 FINDINGS: Cardiac silhouette is top-normal in size. No mediastinal or hilar masses or evidence of adenopathy. Clear lungs.  No pleural effusion or pneumothorax. Bony thorax is intact. IMPRESSION: No active cardiopulmonary disease. Electronically Signed   By: Lajean Manes M.D.   On: 04/16/2016 15:36    Assessment & Plan:  Valerie [name] was seen today for rash, back pain and diabetes.  Diagnoses and all orders for this visit:  Type 2 diabetes mellitus with complication, with long-term current use of insulin ([place])- Her A1c is at 7.2%.  Her blood sugars are adequately well controlled. -     Basic metabolic panel; Future -     Hemoglobin A1c; Future -     Microalbumin / creatinine urine ratio; Future  Leukocytosis, unspecified type- Her white cell count remains mildly elevated.  She is not a smoker.  She is mildly anemic.  The rest of her cell lines are normal.  At this time it appears to be a benign entity but will monitor her for lymphoproliferative disease. -     CBC with Differential/Platelet; Future  Other iron deficiency anemia- Her H&H remain mildly low.  She will continue iron replacement therapy.  There is also likely a component of anemia of chronic disease. -     CBC with Differential/Platelet; Future  Spinal stenosis of lumbar 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 -     oxyCODONE-acetaminophen (PERCOCET) 7.5-325 MG tablet; Take 1 tablet by mouth every 6 (six) hours as needed for severe pain.  Essential hypertension, benign- Her blood pressure is adequately well controlled.  Her lab work is negative for secondary  causes or endorgan damage. -     Urinalysis, Routine w reflex microscopic; Future -     TSH; Future -     Basic metabolic panel; Future  Morbid obesity with BMI of 45.0-49.9, adult ([place])- She is working on her lifestyle modifications to lose weight.  Long-term current use of opiate analgesic -     Pain Mgmt, Profile 8 w/Conf, U; Future -     naloxone (NARCAN) nasal spray 4 mg/0.1 mL; Place 1 spray into the nose once for 1 dose.   I am having Valerie [name] start on naloxone. I am also having her maintain her latanoprost, aspirin, Insulin Pen Needle, albuterol, sertraline, amLODipine, ALPRAZolam, chlorthalidone, gabapentin, FREESTYLE TEST STRIPS, SitaGLIPtin-MetFORMIN HCl, TOUJEO SOLOSTAR, ferrous sulfate, potassium chloride SA, dapagliflozin propanediol, albuterol, Psyllium (METAMUCIL FIBER PO), omeprazole, atorvastatin, carvedilol, olmesartan, valACYclovir, and oxyCODONE-acetaminophen.  Meds ordered this encounter  Medications  . oxyCODONE-acetaminophen (PERCOCET) 7.5-325 MG tablet    Sig: Take 1 tablet by mouth every 6 (six) hours as needed for severe pain.    Dispense:  90 tablet    Refill:  0  . naloxone (NARCAN) nasal spray 4 mg/0.1 mL    Sig: Place 1 spray into the nose once for 1 dose.    Dispense:  2 kit    Refill:  2     Follow-up: Return in about 4 months (around 10/21/2018).  [name], MD

## 2018-06-20 NOTE — Telephone Encounter (Signed)
Copied from [place] [phone]. Topic: General - Other >> Jun 20, 2018 11:59 AM Valla Leaver wrote: Reason for CRM: Caryl Pina [facility] rep, calling stating she will fax application for patient assistance program that will help her get her medications so the patient can fill it out at her appt today at 1:30pm with [place]. Will note -ATTN: Urgent Stephanie, cma

## 2018-06-20 NOTE — Patient Outreach (Signed)
[facility]) Care Management  06/20/2018  Valerie [name] 09-01-1946 [id number]   Successful call attempt, HIPAA identifiers verified. [name] confirmed that she received her Janumet XR and Proventil HFA in the mail on Saturday. I informed her that I had not received the application for Jardiance as of yet and she stated that she did mail it out and that it hadn't been returned to her.  Will follow up with patient with an update on next steps for this issue that has been encountered.  [name] [place], [place] Management [phone]

## 2018-06-20 NOTE — Patient Instructions (Signed)
Shingles Shingles, which is also known as herpes zoster, is an infection that causes a painful skin rash and fluid-filled blisters. Shingles is not related to genital herpes, which is a sexually transmitted infection. Shingles only develops in people who:  Have had chickenpox.  Have received the chickenpox vaccine. (This is rare.)  What are the causes? Shingles is caused by varicella-zoster virus (VZV). This is the same virus that causes chickenpox. After exposure to VZV, the virus stays in the body in an inactive (dormant) state. Shingles develops if the virus reactivates. This can happen many years after the initial exposure to VZV. It is not known what causes this virus to reactivate. What increases the risk? People who have had chickenpox or received the chickenpox vaccine are at risk for shingles. Infection is more common in people who:  Are older than age 50.  Have a weakened defense (immune) system, such as those with HIV, AIDS, or cancer.  Are taking medicines that weaken the immune system, such as transplant medicines.  Are under great stress.  What are the signs or symptoms? Early symptoms of this condition include itching, tingling, and pain in an area on your skin. Pain may be described as burning, stabbing, or throbbing. A few days or weeks after symptoms start, a painful red rash appears, usually on one side of the body in a bandlike or beltlike pattern. The rash eventually turns into fluid-filled blisters that break open, scab over, and dry up in about 2-3 weeks. At any time during the infection, you may also develop:  A fever.  Chills.  A headache.  An upset stomach.  How is this diagnosed? This condition is diagnosed with a skin exam. Sometimes, skin or fluid samples are taken from the blisters before a diagnosis is made. These samples are examined under a microscope or sent to a lab for testing. How is this treated? There is no specific cure for this condition.  Your health care provider will probably prescribe medicines to help you manage pain, recover more quickly, and avoid long-term problems. Medicines may include:  Antiviral drugs.  Anti-inflammatory drugs.  Pain medicines.  If the area involved is on your face, you may be referred to a specialist, such as an eye doctor (ophthalmologist) or an ear, nose, and throat (ENT) doctor to help you avoid eye problems, chronic pain, or disability. Follow these instructions at home: Medicines  Take medicines only as directed by your health care provider.  Apply an anti-itch or numbing cream to the affected area as directed by your health care provider. Blister and Rash Care  Take a cool bath or apply cool compresses to the area of the rash or blisters as directed by your health care provider. This may help with pain and itching.  Keep your rash covered with a loose bandage (dressing). Wear loose-fitting clothing to help ease the pain of material rubbing against the rash.  Keep your rash and blisters clean with mild soap and cool water or as directed by your health care provider.  Check your rash every day for signs of infection. These include redness, swelling, and pain that lasts or increases.  Do not pick your blisters.  Do not scratch your rash. General instructions  Rest as directed by your health care provider.  Keep all follow-up visits as directed by your health care provider. This is important.  Until your blisters scab over, your infection can cause chickenpox in people who have never had it or been vaccinated   against it. To prevent this from happening, avoid contact with other people, especially: ? Babies. ? Pregnant women. ? Children who have eczema. ? Elderly people who have transplants. ? People who have chronic illnesses, such as leukemia or AIDS. Contact a health care provider if:  Your pain is not relieved with prescribed medicines.  Your pain does not get better after  the rash heals.  Your rash looks infected. Signs of infection include redness, swelling, and pain that lasts or increases. Get help right away if:  The rash is on your face or nose.  You have facial pain, pain around your eye area, or loss of feeling on one side of your face.  You have ear pain or you have ringing in your ear.  You have loss of taste.  Your condition gets worse. This information is not intended to replace advice given to you by your health care provider. Make sure you discuss any questions you have with your health care provider. Document Released: 11/30/2005 Document Revised: 07/26/2016 Document Reviewed: 10/11/2014 Elsevier Interactive Patient Education  2018 Elsevier Inc.  

## 2018-06-21 ENCOUNTER — Other Ambulatory Visit: Payer: Self-pay | Admitting: Pharmacy Technician

## 2018-06-21 DIAGNOSIS — Z79891 Long term (current) use of opiate analgesic: Secondary | ICD-10-CM | POA: Insufficient documentation

## 2018-06-21 DIAGNOSIS — B029 Zoster without complications: Secondary | ICD-10-CM | POA: Insufficient documentation

## 2018-06-21 NOTE — Patient Outreach (Signed)
[facility]) Care Management  06/21/2018  KARINDA CABRIALES 12-May-1946 [id number]   Received patient portion of patient assistance application for Jardiance via fax from Dr. Ronnald Ramp office. Faxed completed application into [employer].  Will follow up with B-I in the next 10-14 business days.  [name] [place], [place] Management [phone]

## 2018-06-21 NOTE — Assessment & Plan Note (Signed)
She will complete the course of Valtrex to treat this infection.

## 2018-06-22 LAB — PAIN MGMT, PROFILE 8 W/CONF, U
6 Acetylmorphine: NEGATIVE ng/mL (ref <10)
Alcohol Metabolites: NEGATIVE ng/mL (ref <500)
Amphetamines: NEGATIVE ng/mL (ref <500)
Benzodiazepines: NEGATIVE ng/mL (ref <100)
Buprenorphine, Urine: NEGATIVE ng/mL (ref <5)
Cocaine Metabolite: NEGATIVE ng/mL (ref <150)
Creatinine: 55.3 mg/dL
MDMA: NEGATIVE ng/mL (ref <500)
Marijuana Metabolite: NEGATIVE ng/mL (ref <20)
Noroxycodone: 259 ng/mL — ABNORMAL HIGH (ref <50)
Opiates: NEGATIVE ng/mL (ref <100)
Oxidant: NEGATIVE ug/mL (ref <200)
Oxycodone: 180 ng/mL — ABNORMAL HIGH (ref <50)
Oxycodone: POSITIVE ng/mL — AB (ref <100)
Oxymorphone: 99 ng/mL — ABNORMAL HIGH (ref <50)
pH: 6.38 (ref 4.5–9.0)

## 2018-06-23 ENCOUNTER — Other Ambulatory Visit: Payer: Self-pay | Admitting: Internal Medicine

## 2018-07-04 DIAGNOSIS — B359 Dermatophytosis, unspecified: Secondary | ICD-10-CM | POA: Diagnosis not present

## 2018-07-05 ENCOUNTER — Ambulatory Visit: Payer: Self-pay | Admitting: Pharmacy Technician

## 2018-07-05 ENCOUNTER — Encounter

## 2018-07-05 ENCOUNTER — Ambulatory Visit: Payer: PPO | Admitting: Internal Medicine

## 2018-07-06 ENCOUNTER — Other Ambulatory Visit: Payer: Self-pay | Admitting: Pharmacy Technician

## 2018-07-06 ENCOUNTER — Ambulatory Visit: Payer: PPO | Admitting: *Deleted

## 2018-07-06 ENCOUNTER — Ambulatory Visit: Payer: Self-pay | Admitting: *Deleted

## 2018-07-06 ENCOUNTER — Encounter: Payer: Self-pay | Admitting: Family

## 2018-07-06 ENCOUNTER — Ambulatory Visit (INDEPENDENT_AMBULATORY_CARE_PROVIDER_SITE_OTHER): Payer: PPO | Admitting: Family

## 2018-07-06 ENCOUNTER — Other Ambulatory Visit (INDEPENDENT_AMBULATORY_CARE_PROVIDER_SITE_OTHER): Payer: PPO

## 2018-07-06 VITALS — BP 128/70 | HR 80 | Temp 98.1°F | Ht 66.0 in | Wt 256.6 lb

## 2018-07-06 DIAGNOSIS — E118 Type 2 diabetes mellitus with unspecified complications: Secondary | ICD-10-CM | POA: Diagnosis not present

## 2018-07-06 DIAGNOSIS — Z794 Long term (current) use of insulin: Secondary | ICD-10-CM | POA: Diagnosis not present

## 2018-07-06 DIAGNOSIS — R413 Other amnesia: Secondary | ICD-10-CM

## 2018-07-06 DIAGNOSIS — B354 Tinea corporis: Secondary | ICD-10-CM

## 2018-07-06 LAB — URINALYSIS
Bilirubin Urine: NEGATIVE
Hgb urine dipstick: NEGATIVE
Ketones, ur: NEGATIVE
Leukocytes, UA: NEGATIVE
Nitrite: NEGATIVE
Specific Gravity, Urine: 1.01 (ref 1.000–1.030)
Total Protein, Urine: NEGATIVE
Urine Glucose: NEGATIVE
Urobilinogen, UA: 0.2 (ref 0.0–1.0)
pH: 7 (ref 5.0–8.0)

## 2018-07-06 LAB — VITAMIN B12: Vitamin B-12: 285 pg/mL (ref 211–911)

## 2018-07-06 NOTE — Telephone Encounter (Signed)
  Pt states she thinks she has ringworm on her arms and on her rt knee. She had an appt yesterday with Dr Ronnald Ramp, but thought it was today and missed it. I attempted to reschedule the appt, but pt would only like to see Dr Ronnald Ramp and his schedule is full. She is requesting that a rx be called in for the ringworm. Please call pt to advise.   Pt: [phone]      Patient states she started with symptom of ring worm last week- [facility] with [facility].She was treated with ketoconazole 2% cream. Patient states she is not getting better and was told to follow up for oral treatment if she did not get better. Appointment made. Reason for Disposition . [1] On treatment > 1 week AND [2] rash continues to spread  Answer Assessment - Initial Assessment Questions 1. APPEARANCE of RASH: "What does the rash look like?"      On R and L arm  2. LOCATION: "Where is the rash located?"      arms 3. SIZE: "How large are the spots?"      Size of nickel- R arm, silver dollar size- L arm 4. NUMBER: "How many spots are there?"      3 spots 5. ONSET: "When did the ringworm start?"     Since last week 6. OTHER SYMPTOMS: "Do you have any other symptoms?" (e.g., fever, headache, etc.)     no 7. PREGNANCY: "Is there any chance you are pregnant?" "When was your last menstrual period?"     n/a  Protocols used: [facility]

## 2018-07-06 NOTE — Progress Notes (Signed)
Valerie [name] is a 72 y.o. female with the following history as recorded in EpicCare:  Patient Active Problem List   Diagnosis Date Noted  . Herpes zoster without complication 15/17/6160  . Long-term current use of opiate analgesic 06/21/2018  . External bleeding hemorrhoids 03/24/2018  . Obstructive sleep apnea treated with BiPAP 11/03/2017  . Leukocytosis 05/13/2016  . Osteopenia 03/28/2014  . Insomnia 03/28/2014  . Routine general medical examination at a health care facility 07/17/2013  . Morbid obesity with BMI of 45.0-49.9, adult ([place]) 04/14/2013  . Visit for screening mammogram 01/20/2013  . Spinal stenosis of lumbar 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 10/05/2012  . Hypothyroidism 02/20/2011  . Fatty liver disease, nonalcoholic 73/71/0626  . Mild intermittent asthma 10/25/2009  . Type II diabetes mellitus with manifestations ([place]) 05/06/2009  . Hyperlipidemia with target LDL less than 100 05/06/2009  . Iron deficiency anemia 05/06/2009  . Depression with anxiety 05/06/2009  . Essential hypertension, benign 05/06/2009  . GERD 05/06/2009    Current Outpatient Medications  Medication Sig Dispense Refill  . albuterol (PROVENTIL) (2.5 MG/3ML) 0.083% nebulizer solution     . albuterol (VENTOLIN HFA) 108 (90 Base) MCG/ACT inhaler Inhale 2 puffs into the lungs every 6 (six) hours as needed. For shortness of breath 3 Inhaler 4  . amLODipine (NORVASC) 10 MG tablet TAKE 1 TABLET BY MOUTH ONCE DAILY 90 tablet 1  . aspirin 81 MG tablet Take 81 mg by mouth daily.    [name] atorvastatin (LIPITOR) 20 MG tablet TAKE 1 TABLET BY MOUTH ONCE DAILY 90 tablet 1  . carvedilol (COREG) 25 MG tablet TAKE 1 TABLET BY MOUTH TWICE DAILY WITH MEALS 180 tablet 1  . chlorthalidone (HYGROTON) 25 MG tablet TAKE 1 TABLET BY MOUTH ONCE DAILY 90 tablet 1  . empagliflozin (JARDIANCE) 10 MG TABS tablet Take 10 mg by mouth daily.    . ferrous sulfate 325 (65 FE) MG tablet Take 1 tablet (325 mg total) by mouth 2 (two) times  daily with a meal. 180 tablet 1  . FREESTYLE TEST STRIPS test strip USE TO TEST BLOOD SUGAR UP TO 3 TIMES A DAY 100 each 11  . gabapentin (NEURONTIN) 300 MG capsule TAKE 1 CAPSULE BY MOUTH THREE TIMES DAILY 270 capsule 1  . Insulin Pen Needle 31G X 5 MM MISC Use once daily with insulin 100 each 3  . irbesartan (AVAPRO) 300 MG tablet TAKE 1 TABLET BY MOUTH ONCE DAILY    . ketoconazole (NIZORAL) 2 % cream APPLY CREAM TOPICALLY TO AFFECTED AREA ONCE DAILY FOR 2 WEEKS  0  . latanoprost (XALATAN) 0.005 % ophthalmic solution Place 1 drop into both eyes at bedtime.    [name] olmesartan (BENICAR) 40 MG tablet Take 1 tablet (40 mg total) by mouth daily. 90 tablet 1  . omeprazole (PRILOSEC) 40 MG capsule TAKE 1 CAPSULE BY MOUTH ONCE DAILY 90 capsule 1  . oxyCODONE-acetaminophen (PERCOCET) 7.5-325 MG tablet Take 1 tablet by mouth every 6 (six) hours as needed for severe pain. 90 tablet 0  . potassium chloride SA (K-DUR,KLOR-CON) 20 MEQ tablet Take 1 tablet (20 mEq total) by mouth 2 (two) times daily. 180 tablet 1  . Psyllium (METAMUCIL FIBER PO) Take 1 Dose by mouth 2 (two) times daily.    . sertraline (ZOLOFT) 100 MG tablet Take 1 tablet (100 mg total) by mouth daily. 90 tablet 3  . SitaGLIPtin-MetFORMIN HCl (JANUMET XR) [phone] MG TB24 Take 1 tablet by mouth daily. 90 tablet 1  .  TOUJEO SOLOSTAR 300 UNIT/ML SOPN INJECT 30 UNITS SUBCUTANEOUSLY ONCE DAILY 6 mL 5  . valACYclovir (VALTREX) 1000 MG tablet Take 1,000 mg by mouth 3 (three) times daily with meals.  0   No current facility-administered medications for this visit.     Allergies: Food and Penicillins  Past Medical History:  Diagnosis Date  . Anemia   . Anxiety and depression   . Asthma   . Degenerative arthritis   . Depression   . Diabetes mellitus, type 2 ([place])   . Fatigue   . GERD (gastroesophageal reflux disease)   . Glaucoma   . Hemorrhoids   . Hyperlipidemia   . Hypertension   . Hypothyroid   . Hypoxemia 11/24/2013  . Memory deficit  10/04/2013  . Obesity   . Sleep apnea   . Snoring disorder     Past Surgical History:  Procedure Laterality Date  . benign tumors resected    . CATARACT EXTRACTION Left   . FOOT SURGERY Left    bone spur  . REFRACTIVE SURGERY     Glaucoma  . ROTATOR CUFF REPAIR    . TUBAL LIGATION      Family History  Problem Relation Age of Onset  . Alcohol abuse Mother   . Heart attack Mother   . Coronary artery disease Brother   . Heart attack Father   . Heart disease Sister   . Atrial fibrillation Sister   . Hypertension Sister   . Hypertension Unknown        family history  . Alcohol abuse Unknown     Social History   Tobacco Use  . Smoking status: Never Smoker  . Smokeless tobacco: Never Used  Substance Use Topics  . Alcohol use: No    Alcohol/week: 0.0 oz    Subjective:  Patient was seen on Monday (  Mid-day) at [facility]; diagnosed with ringworm and given Rx for Nizoral; patient is concerned that the area is not healing as quickly as it should be. Wonders if she needs to start an oral medication;  She also mentions concerns about rapid worsening of her memory; feels like she is not keeping up with anything; missed her appt with PCP yestserday- thought it was for today; admits stress level is very high as her husband has end stage renal disease; does have neurologist but has not seen them yet;  Also wonders about samples for her diabetes; in the process of being changed from [place] to [place] through a community funded program; she is in the donut hole and can't afford medications every month; would also like sample of Toujeo if available; hgba1c checked 2 weeks ago at 7.2;   Objective:  Vitals:   07/06/18 1132  BP: 128/70  Pulse: 80  Temp: 98.1 F (36.7 C)  TempSrc: Oral  SpO2: 98%  Weight: 256 lb 9.6 oz (116.4 kg)  Height: 5\' 6"  (1.676 m)    General: Well developed, well nourished, in no acute distress  Skin : Warm and dry. Suspect ring worm on right forearm/  left upper outer arm; appears to be drying; Head: Normocephalic and atraumatic  Eyes: Sclera and conjunctiva clear; pupils round and reactive to light; extraocular movements intact  Ears: External normal; canals clear; tympanic membranes normal  Oropharynx: Pink, supple. No suspicious lesions  Neck: Supple without thyromegaly, adenopathy  Lungs: Respirations unlabored; Vessels: Symmetric bilaterally  Neurologic: Alert and oriented; speech intact; face symmetrical; moves all extremities well; CNII-XII intact without focal deficit  Assessment:  1. Tinea corporis   2. Memory change   3. Type 2 diabetes mellitus with complication, with long-term current use of insulin ([place])     Plan:  1. Reassurance; patient has only been using her topical anti-fungal for 2 days; would like her to try using for 7-10 days before consider alternative treatments; 2. ? Source; rule out B12 deficiency or UTI; follow-up to be determined; 3. Sample of Toujeo as requested; will make medication change to Jardiance 10 mg from [place] due to cost; samples of Jardiance given.    No follow-ups on file.  Orders Placed This Encounter  Procedures  . Urine Culture    Standing Status:   Future    Number of Occurrences:   1    Standing Expiration Date:   07/06/2019  . B12    Standing Status:   Future    Number of Occurrences:   1    Standing Expiration Date:   07/06/2019  . Urinalysis    Standing Status:   Future    Number of Occurrences:   1    Standing Expiration Date:   07/06/2019    Requested Prescriptions    No prescriptions requested or ordered in this encounter

## 2018-07-06 NOTE — Telephone Encounter (Signed)
Can we get her in tomorrow at 10:45am?

## 2018-07-06 NOTE — Patient Outreach (Signed)
[facility]) Care Management  07/06/2018  Valerie [name] Aug 07, 1946 [id number]   Follow up call to [employer] to check status of patients application for Jardiance. Valerie [name] stated that there is a delay in companies processing time and that I should cal back to check status.  Will follow up with company in 2-3 business days.  [name] [place], [place] Management [phone]

## 2018-07-06 NOTE — Patient Instructions (Signed)

## 2018-07-07 LAB — URINE CULTURE
MICRO NUMBER:: 90875847
SPECIMEN QUALITY:: ADEQUATE

## 2018-07-08 ENCOUNTER — Ambulatory Visit: Payer: Self-pay | Admitting: *Deleted

## 2018-07-08 ENCOUNTER — Other Ambulatory Visit: Payer: Self-pay | Admitting: Pharmacy Technician

## 2018-07-08 NOTE — Patient Outreach (Signed)
[facility]) Care Management  07/08/2018  Valerie [name] 1946-03-16 [id number]  Follow up call to [employer] to check status of patients application for Jardiance. [name] stated that appliation still has not been processed on their end.  Will check application status in 5-7 business days.  [name] [place], [place] Management [phone]

## 2018-07-12 ENCOUNTER — Encounter: Payer: Self-pay | Admitting: *Deleted

## 2018-07-12 ENCOUNTER — Other Ambulatory Visit: Payer: Self-pay | Admitting: *Deleted

## 2018-07-12 NOTE — Patient Outreach (Signed)
[facility]) Care Management  07/12/2018  Valerie [name] Apr 16, 1946 [id number]   [place] Monthly Outreach  Referral Date:04/15/2018 Referral Source:Nurse Call Line Screening Reason for Referral:Disease Management Education/Chronic Care Improvement Program Insurance:[employer]   Outreach Attempt:  Successful telephone outreach to patient for monthly follow up.  HIPAA verified with patient.  Patient verbalizing she feels horrible.  Currently with ringworm, severe sore throat, ear aches, lightheadedness and dizziness.  Denies any fever or recent falls.  Has been to [facility] and primary care providers' office for treatment of ringworm; being prescribed cream for the last 8 days.  Areas are getting bigger and spreading.  Patient stating sore throat and ear aches started over the weekend after seeing Nurse Practitioner at primary office.  Encouraged patient to call and schedule follow up appointment to be seen to address worsening ringworm, sore throat, and ear aches.  Also discussed with patient need to stay hydrated while being ill.  Reports face is healed from cellulitis and areas of shingles on back/stomach/side is healed.  Reports feeling some shortness of breath, but states it does not feel like asthma exacerbation and she has not had to utilize her rescue nebulizer.  Reports fasting blood sugar this morning was 138.  Denies any hypo or hyperglycemic events.  Recent Hgb A1C check on 06/20/2018 was 7.2.  Appointments:  Patient last saw Nurse Practitioner at her primary care office on 07/06/2018.  Encouraged patient to contact primary care for appointment to be seen as soon as possible with continued sickness  Plan: RN Health Coach will resend patient [place]. RN Health Coach will resend patient 2019 Calendar Booklet. RN Health Coach will resend patient Living Well with Diabetes Educational Package. RN Health Coach will make next monthly  outreach to patient in the month of August.  Arelie Kuzel RN [place] [phone] [name]

## 2018-07-14 ENCOUNTER — Other Ambulatory Visit: Payer: Self-pay | Admitting: *Deleted

## 2018-07-14 DIAGNOSIS — J029 Acute pharyngitis, unspecified: Secondary | ICD-10-CM | POA: Diagnosis not present

## 2018-07-14 NOTE — Patient Outreach (Signed)
[facility]) Care Management  07/14/2018  Valerie [name] 10-Nov-1946 [id number]   RN Health Coach Generalized Question  Referral Date:04/15/2018 Referral Source:Nurse Call Line Screening Reason for Referral:Disease Management Education/Chronic Care Improvement Program Insurance:[employer]   Outreach Attempt:  Received incoming telephone call from patient.  HIPAA verified with patient.  Patient stating she continues to have sore throat and hoarseness.  Denies any fever.  States she has just returned from Urgent Care and was diagnosed with acute pharyngitis and is doing to pick up a prescription for lidocaine.  Asked patient if she has spoken with primary care provider, as directed to call them last week after our conversation; states she has not spoken with primary care.  Instructed and encouraged patient to contact primary care provider to request appointment as soon as possible.  Also reiterated to patient the need to stay hydated while being sick.  Patient stated her understanding and stated she would contact primary care provider.  Plan:  RN Health Coach will make follow up telephone outreach to patient within the next 10 business days.  [place] [phone] [email or web address]

## 2018-07-15 ENCOUNTER — Ambulatory Visit (INDEPENDENT_AMBULATORY_CARE_PROVIDER_SITE_OTHER): Payer: PPO | Admitting: Family

## 2018-07-15 ENCOUNTER — Encounter: Payer: Self-pay | Admitting: Family

## 2018-07-15 VITALS — BP 122/70 | HR 86 | Temp 98.1°F | Ht 66.0 in | Wt 254.1 lb

## 2018-07-15 DIAGNOSIS — J029 Acute pharyngitis, unspecified: Secondary | ICD-10-CM

## 2018-07-15 DIAGNOSIS — J040 Acute laryngitis: Secondary | ICD-10-CM

## 2018-07-15 MED ORDER — AZITHROMYCIN 250 MG PO TABS: ORAL_TABLET | ORAL | 0 refills | Status: DC

## 2018-07-15 NOTE — Patient Instructions (Signed)
Laryngitis Laryngitis is swelling (inflammation) of your vocal cords. This causes hoarseness, coughing, loss of voice, sore throat, or a dry throat. When your vocal cords are inflamed, your voice sounds different. Laryngitis can be temporary (acute) or long-term (chronic). Most cases of acute laryngitis improve with time. Chronic laryngitis is laryngitis that lasts for more than three weeks. Follow these instructions at home:  Drink enough fluid to keep your pee (urine) clear or pale yellow.  Breathe in moist air. Use a humidifier if you live in a dry climate.  Take medicines only as told by your doctor.  Do not smoke cigarettes or electronic cigarettes. If you need help quitting, ask your doctor.  Talk as little as possible. Also avoid whispering, which can cause vocal strain.  Write instead of talking. Do this until your voice is back to normal. Contact a doctor if:  You have a fever.  Your pain is worse.  You have trouble swallowing. Get help right away if:  You cough up blood.  You have trouble breathing. This information is not intended to replace advice given to you by your health care provider. Make sure you discuss any questions you have with your health care provider. Document Released: 11/19/2011 Document Revised: 05/07/2016 Document Reviewed: 05/15/2014 Elsevier Interactive Patient Education  2018 [place]. Pharyngitis Pharyngitis is a sore throat (pharynx). There is redness, pain, and swelling of your throat. Follow these instructions at home:  Drink enough fluids to keep your pee (urine) clear or pale yellow.  Only take medicine as told by your doctor. ? You may get sick again if you do not take medicine as told. Finish your medicines, even if you start to feel better. ? Do not take aspirin.  Rest.  Rinse your mouth (gargle) with salt water ( tsp of salt per 1 qt of water) every 1-2 hours. This will help the pain.  If you are not at risk for choking,  you can suck on hard candy or sore throat lozenges. Contact a doctor if:  You have large, tender lumps on your neck.  You have a rash.  You cough up [place], yellow-brown, or bloody spit. Get help right away if:  You have a stiff neck.  You drool or cannot swallow liquids.  You throw up (vomit) or are not able to keep medicine or liquids down.  You have very bad pain that does not go away with medicine.  You have problems breathing (not from a stuffy nose). This information is not intended to replace advice given to you by your health care provider. Make sure you discuss any questions you have with your health care provider. Document Released: 05/18/2008 Document Revised: 05/07/2016 Document Reviewed: 08/07/2013 Elsevier Interactive Patient Education  2017 [employer].

## 2018-07-15 NOTE — Progress Notes (Signed)
Valerie [name] is a 72 y.o. female with the following history as recorded in EpicCare:  Patient Active Problem List   Diagnosis Date Noted  . Herpes zoster without complication 81/12/7508  . Long-term current use of opiate analgesic 06/21/2018  . External bleeding hemorrhoids 03/24/2018  . Obstructive sleep apnea treated with BiPAP 11/03/2017  . Leukocytosis 05/13/2016  . Osteopenia 03/28/2014  . Insomnia 03/28/2014  . Routine general medical examination at a health care facility 07/17/2013  . Morbid obesity with BMI of 45.0-49.9, adult ([place]) 04/14/2013  . Visit for screening mammogram 01/20/2013  . Spinal stenosis of lumbar 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 10/05/2012  . Hypothyroidism 02/20/2011  . Fatty liver disease, nonalcoholic 25/85/2778  . Mild intermittent asthma 10/25/2009  . Type II diabetes mellitus with manifestations ([place]) 05/06/2009  . Hyperlipidemia with target LDL less than 100 05/06/2009  . Iron deficiency anemia 05/06/2009  . Depression with anxiety 05/06/2009  . Essential hypertension, benign 05/06/2009  . GERD 05/06/2009    Current Outpatient Medications  Medication Sig Dispense Refill  . albuterol (PROVENTIL) (2.5 MG/3ML) 0.083% nebulizer solution     . albuterol (VENTOLIN HFA) 108 (90 Base) MCG/ACT inhaler Inhale 2 puffs into the lungs every 6 (six) hours as needed. For shortness of breath 3 Inhaler 4  . amLODipine (NORVASC) 10 MG tablet TAKE 1 TABLET BY MOUTH ONCE DAILY 90 tablet 1  . aspirin 81 MG tablet Take 81 mg by mouth daily.    [name] atorvastatin (LIPITOR) 20 MG tablet TAKE 1 TABLET BY MOUTH ONCE DAILY 90 tablet 1  . carvedilol (COREG) 25 MG tablet TAKE 1 TABLET BY MOUTH TWICE DAILY WITH MEALS 180 tablet 1  . chlorthalidone (HYGROTON) 25 MG tablet TAKE 1 TABLET BY MOUTH ONCE DAILY 90 tablet 1  . empagliflozin (JARDIANCE) 10 MG TABS tablet Take 10 mg by mouth daily.    . ferrous sulfate 325 (65 FE) MG tablet Take 1 tablet (325 mg total) by mouth 2 (two) times  daily with a meal. 180 tablet 1  . FREESTYLE TEST STRIPS test strip USE TO TEST BLOOD SUGAR UP TO 3 TIMES A DAY 100 each 11  . gabapentin (NEURONTIN) 300 MG capsule TAKE 1 CAPSULE BY MOUTH THREE TIMES DAILY 270 capsule 1  . Insulin Pen Needle 31G X 5 MM MISC Use once daily with insulin 100 each 3  . irbesartan (AVAPRO) 300 MG tablet TAKE 1 TABLET BY MOUTH ONCE DAILY    . ketoconazole (NIZORAL) 2 % cream APPLY CREAM TOPICALLY TO AFFECTED AREA ONCE DAILY FOR 2 WEEKS  0  . latanoprost (XALATAN) 0.005 % ophthalmic solution Place 1 drop into both eyes at bedtime.    . lidocaine (XYLOCAINE) 2 % solution     . olmesartan (BENICAR) 40 MG tablet Take 1 tablet (40 mg total) by mouth daily. 90 tablet 1  . omeprazole (PRILOSEC) 40 MG capsule TAKE 1 CAPSULE BY MOUTH ONCE DAILY 90 capsule 1  . oxyCODONE-acetaminophen (PERCOCET) 7.5-325 MG tablet Take 1 tablet by mouth every 6 (six) hours as needed for severe pain. 90 tablet 0  . potassium chloride SA (K-DUR,KLOR-CON) 20 MEQ tablet Take 1 tablet (20 mEq total) by mouth 2 (two) times daily. 180 tablet 1  . Psyllium (METAMUCIL FIBER PO) Take 1 Dose by mouth 2 (two) times daily.    . sertraline (ZOLOFT) 100 MG tablet Take 1 tablet (100 mg total) by mouth daily. 90 tablet 3  . SitaGLIPtin-MetFORMIN HCl (JANUMET XR) [phone] MG TB24 Take  1 tablet by mouth daily. 90 tablet 1  . TOUJEO SOLOSTAR 300 UNIT/ML SOPN INJECT 30 UNITS SUBCUTANEOUSLY ONCE DAILY 6 mL 5  . valACYclovir (VALTREX) 1000 MG tablet Take 1,000 mg by mouth 3 (three) times daily with meals.  0  . azithromycin (ZITHROMAX) 250 MG tablet 2 tabs po qd x 1 day; 1 tablet per day x 4 days; 6 tablet 0   No current facility-administered medications for this visit.     Allergies: Food and Penicillins  Past Medical History:  Diagnosis Date  . Anemia   . Anxiety and depression   . Asthma   . Degenerative arthritis   . Depression   . Diabetes mellitus, type 2 ([place])   . Fatigue   . GERD (gastroesophageal  reflux disease)   . Glaucoma   . Hemorrhoids   . Hyperlipidemia   . Hypertension   . Hypothyroid   . Hypoxemia 11/24/2013  . Memory deficit 10/04/2013  . Obesity   . Sleep apnea   . Snoring disorder     Past Surgical History:  Procedure Laterality Date  . benign tumors resected    . CATARACT EXTRACTION Left   . FOOT SURGERY Left    bone spur  . REFRACTIVE SURGERY     Glaucoma  . ROTATOR CUFF REPAIR    . TUBAL LIGATION      Family History  Problem Relation Age of Onset  . Alcohol abuse Mother   . Heart attack Mother   . Coronary artery disease Brother   . Heart attack Father   . Heart disease Sister   . Atrial fibrillation Sister   . Hypertension Sister   . Hypertension Unknown        family history  . Alcohol abuse Unknown     Social History   Tobacco Use  . Smoking status: Never Smoker  . Smokeless tobacco: Never Used  Substance Use Topics  . Alcohol use: No    Alcohol/week: 0.0 oz    Subjective:  Patient presents with concerns for 1 week of sore throat/ laryngitis; hurts to swallow/ hoarse; no known fever; went to [facility] yesterday and had normal strep test/ throat culture is pending; was given bottle of viscous Lidocaine to take 10 ml twice a day; she is frustrated and notes she doesn't understand how she is supposed to use the medication- no syringe or instructions given at the pharmacy;   Objective:  Vitals:   07/15/18 1035  BP: 122/70  Pulse: 86  Temp: 98.1 F (36.7 C)  TempSrc: Oral  SpO2: 98%  Weight: 254 lb 1.3 oz (115.2 kg)  Height: 5\' 6"  (1.676 m)    General: Well developed, well nourished, in no acute distress  Skin : Warm and dry.  Head: Normocephalic and atraumatic  Oropharynx: Pink, supple. No suspicious lesions  Neck: Supple without thyromegaly, adenopathy  Lungs: Respirations unlabored; clear to auscultation bilaterally without wheeze, rales, rhonchi  CVS exam: normal rate and regular rhythm.  Neurologic: Alert and oriented; speech  intact; face symmetrical; moves all extremities well; CNII-XII intact without focal deficit   Assessment:  1. Pharyngitis, unspecified etiology   2. Acute laryngitis     Plan:  Rapid strep at [facility] was negative yesterday and throat culture is pending; due to length of time symptoms present and severity of symptoms, will go ahead and treat with Z-pak; she is instructed on use of Viscous Lidocaine and given a dose in the office; increase fluids, rest and follow-up worse,  no better.   No follow-ups on file.  No orders of the defined types were placed in this encounter.   Requested Prescriptions   Signed Prescriptions Disp Refills  . azithromycin (ZITHROMAX) 250 MG tablet 6 tablet 0    Sig: 2 tabs po qd x 1 day; 1 tablet per day x 4 days;

## 2018-07-18 DIAGNOSIS — G4733 Obstructive sleep apnea (adult) (pediatric): Secondary | ICD-10-CM | POA: Diagnosis not present

## 2018-07-22 ENCOUNTER — Encounter: Payer: Self-pay | Admitting: *Deleted

## 2018-07-22 ENCOUNTER — Other Ambulatory Visit: Payer: Self-pay | Admitting: *Deleted

## 2018-07-22 NOTE — Patient Outreach (Signed)
[facility]) Care Management  [place]  07/22/2018   Valerie [name] 12-21-1945 [id number]   [place] Monthly Outreach   Referral Date:  04/15/2018 Referral Source:  Nurse Call Line Screening Reason for Referral:  Disease Management Education/Chronic Care Improvement Program Insurance:  [employer]   Outreach Attempt:  Successful telephone outreach to patient for monthly follow up.  HIPAA verified with patient.  Patient stating she is feeling some better.  Not 100% but her throat feels better and she sounds better.  Reports completing the antibiotics prescribed last week.  Does state she has not gotten much rest with caring for her husband, taking him to appointments, and other church engagements.  Continues to report feeling weak, tired, lightheaded, and dizziness.  Discussed with patient importance of staying hydrated while sick and encouraged patient to increase her water intake.  Also discussed with patient the importance of getting rest to let her body heal in this process.  Reviewed with patient the troubles of stress in the process of healing.  Encouraged patient to limit social activities and get as much rest as she could.  Reports fasting blood sugars have ranged 120-140's and this mornings blood sugar was 138.  Patient continues to report areas of ringworm.  Verbalizes she is using the cream twice a day as instructed by physician.  Encouraged to continue with cream as instructed, rest, stay hydrated, and to notify primary care provider if areas do not heal or get worse.  Encounter Medications:  Outpatient Encounter Medications as of 07/22/2018  Medication Sig Note  . albuterol (PROVENTIL) (2.5 MG/3ML) 0.083% nebulizer solution  04/16/2016: Received from: External Pharmacy  . albuterol (VENTOLIN HFA) 108 (90 Base) MCG/ACT inhaler Inhale 2 puffs into the lungs every 6 (six) hours as needed. For shortness of breath   . amLODipine (NORVASC) 10 MG  tablet TAKE 1 TABLET BY MOUTH ONCE DAILY   . aspirin 81 MG tablet Take 81 mg by mouth daily.   [name] atorvastatin (LIPITOR) 20 MG tablet TAKE 1 TABLET BY MOUTH ONCE DAILY   . carvedilol (COREG) 25 MG tablet TAKE 1 TABLET BY MOUTH TWICE DAILY WITH MEALS   . chlorthalidone (HYGROTON) 25 MG tablet TAKE 1 TABLET BY MOUTH ONCE DAILY   . empagliflozin (JARDIANCE) 10 MG TABS tablet Take 10 mg by mouth daily.   . ferrous sulfate 325 (65 FE) MG tablet Take 1 tablet (325 mg total) by mouth 2 (two) times daily with a meal.   . FREESTYLE TEST STRIPS test strip USE TO TEST BLOOD SUGAR UP TO 3 TIMES A DAY   . gabapentin (NEURONTIN) 300 MG capsule TAKE 1 CAPSULE BY MOUTH THREE TIMES DAILY   . Insulin Pen Needle 31G X 5 MM MISC Use once daily with insulin   . irbesartan (AVAPRO) 300 MG tablet TAKE 1 TABLET BY MOUTH ONCE DAILY   . ketoconazole (NIZORAL) 2 % cream APPLY CREAM TOPICALLY TO AFFECTED AREA ONCE DAILY FOR 2 WEEKS   . latanoprost (XALATAN) 0.005 % ophthalmic solution Place 1 drop into both eyes at bedtime.   . lidocaine (XYLOCAINE) 2 % solution    . olmesartan (BENICAR) 40 MG tablet Take 1 tablet (40 mg total) by mouth daily.   [name] omeprazole (PRILOSEC) 40 MG capsule TAKE 1 CAPSULE BY MOUTH ONCE DAILY   . oxyCODONE-acetaminophen (PERCOCET) 7.5-325 MG tablet Take 1 tablet by mouth every 6 (six) hours as needed for severe pain.   . potassium chloride  SA (K-DUR,KLOR-CON) 20 MEQ tablet Take 1 tablet (20 mEq total) by mouth 2 (two) times daily.   . Psyllium (METAMUCIL FIBER PO) Take 1 Dose by mouth 2 (two) times daily.   . SitaGLIPtin-MetFORMIN HCl (JANUMET XR) [phone] MG TB24 Take 1 tablet by mouth daily.   . TOUJEO SOLOSTAR 300 UNIT/ML SOPN INJECT 30 UNITS SUBCUTANEOUSLY ONCE DAILY   . valACYclovir (VALTREX) 1000 MG tablet Take 1,000 mg by mouth 3 (three) times daily with meals.   [name] azithromycin (ZITHROMAX) 250 MG tablet 2 tabs po qd x 1 day; 1 tablet per day x 4 days; 07/22/2018: Patient states she has  completed  . sertraline (ZOLOFT) 100 MG tablet Take 1 tablet (100 mg total) by mouth daily. (Patient not taking: Reported on 07/22/2018) 05/03/2018: Reports not taking   No facility-administered encounter medications on file as of 07/22/2018.     Functional Status:  In your present state of health, do you have any difficulty performing the following activities: 05/03/2018 10/28/2017  Hearing? N N  Vision? N N  Difficulty concentrating or making decisions? Y N  Comment trouble remembering things -  Walking or climbing stairs? N Y  Dressing or bathing? N N  Doing errands, shopping? N N  Preparing Food and eating ? N N  Using the Toilet? N N  In the past six months, have you accidently leaked urine? N N  Do you have problems with loss of bowel control? N N  Managing your Medications? N N  Managing your Finances? N N  Housekeeping or managing your Housekeeping? N N  Some recent data might be hidden    Fall/Depression Screening: Fall Risk  07/12/2018 05/03/2018 04/20/2018  Falls in the past year? Yes Yes Yes  Comment no falls in the last month - -  Number falls in past yr: 2 or more 2 or more 1  Comment - - shoveling snow  Injury with Fall? No No -  Risk Factor Category  High Fall Risk High Fall Risk -  Risk for fall due to : Mental status change;History of fall(s);Impaired balance/gait History of fall(s) -  Follow up Falls prevention discussed;Education provided Education provided;Falls prevention discussed -   PHQ 2/9 Scores 05/03/2018 04/20/2018 04/15/2018 12/15/2017 10/28/2017 06/02/2017 04/29/2016  PHQ - 2 Score 0 0 '2 4 2 ' - 2  PHQ- 9 Score - - '12 9 11 ' - -  Exception Documentation - - - - - Patient refusal -    THN CM Care Plan Problem One     Most Recent Value  Care Plan Problem One  Knowledge defiecit related to self care management of diabetes  Role Documenting the Problem One  [place] for Problem One  Active  THN Long Term Goal   Patient will report no hospitalizations  in the next 90 days.  THN Long Term Goal Start Date  07/12/18  Interventions for Problem One Long Term Goal  Current care plan and goals reviewed and discussed with patient, patient encouraged to keep and attend medical appointments, discussed importance of staying hydrated while being sick and encouraged patient to increase her fluid intake, fall precautions and preventins discussed, encouraged patient to decrease her volunteer chores/activities and to get as much rest as she can, reviewed medications and encouraged compliance, confirmed patient has completed zpack  THN CM Short Term Goal #1   Patient will report recieiving Diabetes Educational material in the next 30 days.  THN CM Short Term Goal #1 Start  Date  07/12/18  Interventions for Short Term Goal #1  Confirmed patient's address with patient, confirmed with Secretary mailed packet did not return to office unopened, resent [place], Living Well with Diabetes [employer], 2019 [place] Booklet, encouraged patient to review material once she recieves packet, instructed patient to notify [place] if she does not recieve packet soon  [facility] CM Short Term Goal #2   Patient will report healing of ringworm in the next 30 days.  THN CM Short Term Goal #2 Start Date  07/22/18  [facility] CM Short Term Goal #2 Met Date  07/22/18  Interventions for Short Term Goal #2  Encouraged patient to continue to use cream prescribed by provider as directed and to notify the physician if areas of infection gets worse or start to spread more, encouraged patient to get some rest, discussed with patient how stress makes it difficult for a person to heal     Appointments:  Patient attended primary care provider's appointment on 07/15/2018.  Encouraged to schedule follow up appointment.  Plan: RN Health Coach will make next monthly outreach to patient in the month of September. RN Health Coach will send primary care provider Quarterly Update.  [place] [phone] [email or web address]

## 2018-07-26 ENCOUNTER — Other Ambulatory Visit: Payer: Self-pay | Admitting: Pharmacy Technician

## 2018-07-26 NOTE — Patient Outreach (Signed)
[facility]) Care Management  07/26/2018  Valerie [name] 10-26-1946 [id number]   Follow up call to check patients application status for Jardiance. Spoke to [name] who stated patient was approved as of 07/19/2018 until 12/13/18 and that medication was mailed out today.  Will follow up with patient in 7-10 business days to confirm medication has been received.  [name] [place], [place] Management [phone]

## 2018-07-27 ENCOUNTER — Other Ambulatory Visit: Payer: Self-pay | Admitting: Internal Medicine

## 2018-08-06 ENCOUNTER — Other Ambulatory Visit: Payer: Self-pay | Admitting: Internal Medicine

## 2018-08-06 DIAGNOSIS — E118 Type 2 diabetes mellitus with unspecified complications: Secondary | ICD-10-CM

## 2018-08-06 MED ORDER — EMPAGLIFLOZIN 25 MG PO TABS: 25.0000 mg | ORAL_TABLET | Freq: Every day | ORAL | 1 refills | Status: DC

## 2018-08-08 ENCOUNTER — Other Ambulatory Visit: Payer: Self-pay | Admitting: Pharmacist

## 2018-08-08 NOTE — Patient Outreach (Signed)
[place] [facility]) Care Management  08/08/2018  SUPRINA MANDEVILLE 01-09-1946 [id number]  Communication received from [place] that patient has now received all medications (Jardiance, Janumet, and Proventil) from patient assistance programs that [facility] has been assisting with.   Call placed to Ms. Kruser to follow-up on medications.  Patient would like clarification on dose of Jardiance.  Inbasket message sent to PCP who verified that Jardiance dose should be 25mg  once daily.  Patient updated with MD response.  She has appointment with PCP this Thursday and will follow-up during visit with further questions arise regarding dose.    Patient denies any other medication related questions or concerns at this time.  She has my phone number if she needs to reach out to me in the future.   Plan: I will close [place] medication assistance case per case closure workflow.   Ralene Bathe, PharmD, [place] [phone]

## 2018-08-11 ENCOUNTER — Ambulatory Visit (INDEPENDENT_AMBULATORY_CARE_PROVIDER_SITE_OTHER): Payer: PPO | Admitting: Internal Medicine

## 2018-08-11 ENCOUNTER — Other Ambulatory Visit (INDEPENDENT_AMBULATORY_CARE_PROVIDER_SITE_OTHER): Payer: PPO

## 2018-08-11 ENCOUNTER — Encounter: Payer: Self-pay | Admitting: Internal Medicine

## 2018-08-11 VITALS — BP 148/94 | HR 80 | Temp 97.9°F | Resp 18 | Wt 258.0 lb

## 2018-08-11 DIAGNOSIS — D539 Nutritional anemia, unspecified: Secondary | ICD-10-CM

## 2018-08-11 DIAGNOSIS — I10 Essential (primary) hypertension: Secondary | ICD-10-CM

## 2018-08-11 DIAGNOSIS — R21 Rash and other nonspecific skin eruption: Secondary | ICD-10-CM | POA: Insufficient documentation

## 2018-08-11 DIAGNOSIS — D508 Other iron deficiency anemias: Secondary | ICD-10-CM

## 2018-08-11 DIAGNOSIS — L308 Other specified dermatitis: Secondary | ICD-10-CM

## 2018-08-11 DIAGNOSIS — E559 Vitamin D deficiency, unspecified: Secondary | ICD-10-CM | POA: Diagnosis not present

## 2018-08-11 DIAGNOSIS — D518 Other vitamin B12 deficiency anemias: Secondary | ICD-10-CM

## 2018-08-11 LAB — FOLATE: Folate: 11.1 ng/mL (ref >5.9)

## 2018-08-11 LAB — COMPREHENSIVE METABOLIC PANEL
ALT: 18 U/L (ref 0–35)
AST: 17 U/L (ref 0–37)
Albumin: 4.2 g/dL (ref 3.5–5.2)
Alkaline Phosphatase: 113 U/L (ref 39–117)
BUN: 11 mg/dL (ref 6–23)
CO2: 30 mEq/L (ref 19–32)
Calcium: 9.4 mg/dL (ref 8.4–10.5)
Chloride: 104 mEq/L (ref 96–112)
Creatinine, Ser: 0.97 mg/dL (ref 0.40–1.20)
GFR: 72.92 mL/min (ref >60.00)
Glucose, Bld: 157 mg/dL — ABNORMAL HIGH (ref 70–99)
Potassium: 3.7 mEq/L (ref 3.5–5.1)
Sodium: 142 mEq/L (ref 135–145)
Total Bilirubin: 0.4 mg/dL (ref 0.2–1.2)
Total Protein: 7.8 g/dL (ref 6.0–8.3)

## 2018-08-11 LAB — CBC WITH DIFFERENTIAL/PLATELET
Basophils Absolute: 0.1 10*3/uL (ref 0.0–0.1)
Basophils Relative: 0.7 % (ref 0.0–3.0)
Eosinophils Absolute: 0.3 10*3/uL (ref 0.0–0.7)
Eosinophils Relative: 2.8 % (ref 0.0–5.0)
HCT: 32.6 % — ABNORMAL LOW (ref 36.0–46.0)
Hemoglobin: 10.6 g/dL — ABNORMAL LOW (ref 12.0–15.0)
Lymphocytes Relative: 25 % (ref 12.0–46.0)
Lymphs Abs: 2.9 10*3/uL (ref 0.7–4.0)
MCHC: 32.4 g/dL (ref 30.0–36.0)
MCV: 88.4 fl (ref 78.0–100.0)
Monocytes Absolute: 1.1 10*3/uL — ABNORMAL HIGH (ref 0.1–1.0)
Monocytes Relative: 9.6 % (ref 3.0–12.0)
Neutro Abs: 7 10*3/uL (ref 1.4–7.7)
Neutrophils Relative %: 61.9 % (ref 43.0–77.0)
Platelets: 380 10*3/uL (ref 150.0–400.0)
RBC: 3.69 Mil/uL — ABNORMAL LOW (ref 3.87–5.11)
RDW: 17.1 % — ABNORMAL HIGH (ref 11.5–15.5)
WBC: 11.4 10*3/uL — ABNORMAL HIGH (ref 4.0–10.5)

## 2018-08-11 LAB — FERRITIN: Ferritin: 53.1 ng/mL (ref 10.0–291.0)

## 2018-08-11 LAB — IBC PANEL
Iron: 77 ug/dL (ref 42–145)
Saturation Ratios: 20.4 % (ref 20.0–50.0)
Transferrin: 269 mg/dL (ref 212.0–360.0)

## 2018-08-11 LAB — SEDIMENTATION RATE: Sed Rate: 76 mm/hr — ABNORMAL HIGH (ref 0–30)

## 2018-08-11 LAB — VITAMIN D 25 HYDROXY (VIT D DEFICIENCY, FRACTURES): VITD: 12.24 ng/mL — ABNORMAL LOW (ref 30.00–100.00)

## 2018-08-11 LAB — VITAMIN B12: Vitamin B-12: 266 pg/mL (ref 211–911)

## 2018-08-11 MED ORDER — CHOLECALCIFEROL 1.25 MG (50000 UT) PO CAPS: 50000.0000 [IU] | ORAL_CAPSULE | ORAL | 1 refills | Status: DC

## 2018-08-11 NOTE — Progress Notes (Signed)
Subjective:  Patient ID: Valerie [name], female    DOB: 30-Sep-1946  Age: 72 y.o. MRN: [id number]  CC: Rash   HPI [name] presents for concerns about a rash on both upper arms.  She has noticed it for about 2 to 3 months.  There is mild itching associated.  She saw another provider and has been treating it with topical ketoconazole but not only is the rash not resolving but she says it is spreading.  Outpatient Medications Prior to Visit  Medication Sig Dispense Refill  . albuterol (PROVENTIL) (2.5 MG/3ML) 0.083% nebulizer solution     . albuterol (VENTOLIN HFA) 108 (90 Base) MCG/ACT inhaler Inhale 2 puffs into the lungs every 6 (six) hours as needed. For shortness of breath 3 Inhaler 4  . amLODipine (NORVASC) 10 MG tablet TAKE 1 TABLET BY MOUTH ONCE DAILY 90 tablet 1  . aspirin 81 MG tablet Take 81 mg by mouth daily.    [name] atorvastatin (LIPITOR) 20 MG tablet TAKE 1 TABLET BY MOUTH ONCE DAILY 90 tablet 1  . carvedilol (COREG) 25 MG tablet TAKE 1 TABLET BY MOUTH TWICE DAILY WITH MEALS 180 tablet 1  . chlorthalidone (HYGROTON) 25 MG tablet TAKE 1 TABLET BY MOUTH ONCE DAILY 90 tablet 1  . empagliflozin (JARDIANCE) 25 MG TABS tablet Take 25 mg by mouth daily. 90 tablet 1  . ferrous sulfate 325 (65 FE) MG tablet Take 1 tablet (325 mg total) by mouth 2 (two) times daily with a meal. 180 tablet 1  . FREESTYLE TEST STRIPS test strip USE TO TEST BLOOD SUGAR UP TO 3 TIMES A DAY 100 each 11  . gabapentin (NEURONTIN) 300 MG capsule TAKE 1 CAPSULE BY MOUTH THREE TIMES DAILY 270 capsule 1  . Insulin Pen Needle 31G X 5 MM MISC Use once daily with insulin 100 each 3  . irbesartan (AVAPRO) 300 MG tablet TAKE 1 TABLET BY MOUTH ONCE DAILY    . latanoprost (XALATAN) 0.005 % ophthalmic solution Place 1 drop into both eyes at bedtime.    . lidocaine (XYLOCAINE) 2 % solution     . olmesartan (BENICAR) 40 MG tablet Take 1 tablet (40 mg total) by mouth daily. 90 tablet 1  . omeprazole (PRILOSEC) 40 MG  capsule TAKE 1 CAPSULE BY MOUTH ONCE DAILY 90 capsule 1  . oxyCODONE-acetaminophen (PERCOCET) 7.5-325 MG tablet Take 1 tablet by mouth every 6 (six) hours as needed for severe pain. 90 tablet 0  . potassium chloride SA (K-DUR,KLOR-CON) 20 MEQ tablet Take 1 tablet (20 mEq total) by mouth 2 (two) times daily. 180 tablet 1  . sertraline (ZOLOFT) 100 MG tablet Take 1 tablet (100 mg total) by mouth daily. 90 tablet 3  . SitaGLIPtin-MetFORMIN HCl (JANUMET XR) [phone] MG TB24 Take 1 tablet by mouth daily. 90 tablet 1  . TOUJEO SOLOSTAR 300 UNIT/ML SOPN INJECT 30 UNITS SUBCUTANEOUSLY ONCE DAILY 6 mL 5  . ketoconazole (NIZORAL) 2 % cream APPLY CREAM TOPICALLY TO AFFECTED AREA ONCE DAILY FOR 2 WEEKS  0  . Psyllium (METAMUCIL FIBER PO) Take 1 Dose by mouth 2 (two) times daily.    . valACYclovir (VALTREX) 1000 MG tablet Take 1,000 mg by mouth 3 (three) times daily with meals.  0   No facility-administered medications prior to visit.     ROS Review of Systems  Constitutional: Positive for fatigue. Negative for appetite change and diaphoresis.  HENT: Negative for trouble swallowing and voice change.   Eyes: Negative for  visual disturbance.  Respiratory: Negative.  Negative for cough, chest tightness, shortness of breath and wheezing.   Cardiovascular: Negative for chest pain, palpitations and leg swelling.  Gastrointestinal: Negative for abdominal pain, blood in stool, constipation, diarrhea, nausea and vomiting.  Genitourinary: Negative.  Negative for difficulty urinating and dysuria.  Musculoskeletal: Negative.  Negative for arthralgias, back pain, myalgias and neck pain.  Skin: Positive for rash. Negative for color change.  Neurological: Negative.  Negative for dizziness, weakness and light-headedness.  Hematological: Negative for adenopathy. Does not bruise/bleed easily.  Psychiatric/Behavioral: Negative.     Objective:  BP (!) 148/94   Pulse 80   Temp 97.9 F (36.6 C) (Oral)   Resp 18   Wt  258 lb (117 kg)   SpO2 98%   BMI 41.64 kg/m   BP Readings from Last 3 Encounters:  08/11/18 (!) 148/94  07/15/18 122/70  07/06/18 128/70    Wt Readings from Last 3 Encounters:  08/11/18 258 lb (117 kg)  07/15/18 254 lb 1.3 oz (115.2 kg)  07/06/18 256 lb 9.6 oz (116.4 kg)    Physical Exam  Constitutional: She is oriented to person, place, and time. No distress.  HENT:  Mouth/Throat: Oropharynx is clear and moist. No oropharyngeal exudate.  Eyes: Conjunctivae are normal. No scleral icterus.  Neck: Normal range of motion. Neck supple. No JVD present. No thyromegaly present.  Cardiovascular: Normal rate, regular rhythm and normal heart sounds. Exam reveals no gallop.  Pulmonary/Chest: Effort normal and breath sounds normal.  Musculoskeletal: Normal range of motion. She exhibits no edema, tenderness or deformity.       Arms: Lesion on the left mid humerus is biopsied.  The area was cleaned with Betadine.  Anesthesia was obtained with the instillation of 1% lidocaine with epi.  A 4 mm punch incision was made.  Specimen collected and sent for pathology.  One simple interrupted suture using 4-0 nylon on the PC 3 was used to close the wound.  There was no blood loss.  She tolerated this well.  There was hemostasis after the procedure was completed.  Lymphadenopathy:    She has no cervical adenopathy.  Neurological: She is alert and oriented to person, place, and time.  Skin: Skin is warm and dry. Rash noted. She is not diaphoretic. No pallor.  There are annular macules on both upper extremities, about 2 or 3 on each side.  They measure anywhere from 2 to 5 cm.  They are mildly and diffusely hyperpigmented with central clearing.  The edges are not raised but they are flaky.  Vitals reviewed.   Lab Results  Component Value Date   WBC 11.4 (H) 08/11/2018   HGB 10.6 (L) 08/11/2018   HCT 32.6 (L) 08/11/2018   PLT 380.0 08/11/2018   GLUCOSE 157 (H) 08/11/2018   CHOL 135 10/06/2017   TRIG  261.0 (H) 10/06/2017   HDL 46.30 10/06/2017   LDLDIRECT 55.0 10/06/2017   LDLCALC 43 03/28/2014   ALT 18 08/11/2018   AST 17 08/11/2018   NA 142 08/11/2018   K 3.7 08/11/2018   CL 104 08/11/2018   CREATININE 0.97 08/11/2018   BUN 11 08/11/2018   CO2 30 08/11/2018   TSH 2.33 06/20/2018   INR 1.06 08/29/2015   HGBA1C 7.2 (H) 06/20/2018   MICROALBUR <0.7 06/20/2018    Dg Chest 2 View  Result Date: 04/16/2016 CLINICAL DATA:  Recent diagnosis of pneumonia. Coughing and wheezing and shortness of breath for 2 weeks. History of hypertension.  EXAM: CHEST  2 VIEW COMPARISON:  08/29/2015 FINDINGS: Cardiac silhouette is top-normal in size. No mediastinal or hilar masses or evidence of adenopathy. Clear lungs.  No pleural effusion or pneumothorax. Bony thorax is intact. IMPRESSION: No active cardiopulmonary disease. Electronically Signed   By: Lajean Manes M.D.   On: 04/16/2016 15:36    Assessment & Plan:   Leland was seen today for rash.  Diagnoses and all orders for this visit:  Other iron deficiency anemia- She remains anemic but her iron level is normal. -     CBC with Differential/Platelet; Future -     IBC panel; Future -     Ferritin; Future  Rash- Her sed rate is moderately elevated.  This does not look like urticaria or vasculitis.  I will screen for Lyme disease.  I also await the results of the biopsy specimen to better identify the diagnosis and treatment options. -     Sedimentation rate; Future -     B. burgdorfi antibodies by WB; Future -     Comprehensive metabolic panel; Future -     Dermatology pathology; Future  Deficiency anemia- She remains mildly anemic with a borderline low B12 level.  I have asked her to start taking high-dose oral B12 supplementation. -     CBC with Differential/Platelet; Future -     Vitamin B12; Future -     IBC panel; Future -     Ferritin; Future -     Folate; Future  Essential hypertension, benign- Her blood pressure is not adequately  well controlled.  I will treat the vitamin D deficiency. -     VITAMIN D 25 Hydroxy (Vit-D Deficiency, Fractures); Future -     Comprehensive metabolic panel; Future  Vitamin D deficiency disease -     Cholecalciferol 50000 units capsule; Take 1 capsule (50,000 Units total) by mouth once a week.  Other vitamin B12 deficiency anemia -     cyanocobalamin 2000 MCG tablet; Take 1 tablet (2,000 mcg total) by mouth daily.   I have discontinued Pamala Hurry A. Munch's Psyllium (METAMUCIL FIBER PO), valACYclovir, and ketoconazole. I am also having her start on Cholecalciferol and cyanocobalamin. Additionally, I am having her maintain her latanoprost, aspirin, Insulin Pen Needle, albuterol, sertraline, amLODipine, chlorthalidone, FREESTYLE TEST STRIPS, SitaGLIPtin-MetFORMIN HCl, TOUJEO SOLOSTAR, ferrous sulfate, potassium chloride SA, albuterol, omeprazole, atorvastatin, carvedilol, olmesartan, oxyCODONE-acetaminophen, irbesartan, lidocaine, gabapentin, and empagliflozin.  Meds ordered this encounter  Medications  . Cholecalciferol 50000 units capsule    Sig: Take 1 capsule (50,000 Units total) by mouth once a week.    Dispense:  12 capsule    Refill:  1  . cyanocobalamin 2000 MCG tablet    Sig: Take 1 tablet (2,000 mcg total) by mouth daily.    Dispense:  90 tablet    Refill:  1     Follow-up: Return in about 1 week (around 08/18/2018).  [name], MD

## 2018-08-11 NOTE — Patient Instructions (Signed)
Skin Biopsy, Care After Refer to this sheet in the next few weeks. These instructions provide you with information about caring for yourself after your procedure. Your health care provider may also give you more specific instructions. Your treatment has been planned according to current medical practices, but problems sometimes occur. Call your health care provider if you have any problems or questions after your procedure. What can I expect after the procedure? After the procedure, it is common to have:  Soreness.  Bruising.  Itching.  Follow these instructions at home:  Rest and then return to your normal activities as told by your health care provider.  Take over-the-counter and prescription medicines only as told by your health care provider.  Follow instructions from your health care provider about how to take care of your biopsy site.Make sure you: ? Wash your hands with soap and water before you change your bandage (dressing). If soap and water are not available, use hand sanitizer. ? Change your dressing as told by your health care provider. ? Leave stitches (sutures), skin glue, or adhesive strips in place. These skin closures may need to stay in place for 2 weeks or longer. If adhesive strip edges start to loosen and curl up, you may trim the loose edges. Do not remove adhesive strips completely unless your health care provider tells you to do that. If the biopsy area bleeds, apply gentle pressure for 10 minutes.  Check your biopsy site every day for signs of infection. Check for: ? More redness, swelling, or pain. ? More fluid or blood. ? Warmth. ? Pus or a bad smell.  Keep all follow-up visits as told by your health care provider. This is important. Contact a health care provider if:  You have more redness, swelling, or pain around your biopsy site.  You have more fluid or blood coming from your biopsy site.  Your biopsy site feels warm to the touch.  You have pus or  a bad smell coming from your biopsy site.  You have a fever. Get help right away if:  You have bleeding that does not stop with pressure or a dressing. This information is not intended to replace advice given to you by your health care provider. Make sure you discuss any questions you have with your health care provider. Document Released: 12/27/2015 Document Revised: 07/26/2016 Document Reviewed: 02/27/2015 Elsevier Interactive Patient Education  2018 Elsevier Inc.  

## 2018-08-12 MED ORDER — CYANOCOBALAMIN 2000 MCG PO TABS: 2000.0000 ug | ORAL_TABLET | Freq: Every day | ORAL | 1 refills | Status: DC

## 2018-08-15 LAB — NO SPECIMEN RECEIVED: Tests (Ordered): 8593

## 2018-08-17 ENCOUNTER — Other Ambulatory Visit: Payer: PPO

## 2018-08-17 DIAGNOSIS — R21 Rash and other nonspecific skin eruption: Secondary | ICD-10-CM | POA: Diagnosis not present

## 2018-08-17 LAB — EXTRA LAV TOP TUBE

## 2018-08-17 LAB — B. BURGDORFI ANTIBODIES BY WB

## 2018-08-18 ENCOUNTER — Encounter: Payer: Self-pay | Admitting: *Deleted

## 2018-08-18 ENCOUNTER — Ambulatory Visit (INDEPENDENT_AMBULATORY_CARE_PROVIDER_SITE_OTHER): Payer: PPO | Admitting: Internal Medicine

## 2018-08-18 ENCOUNTER — Encounter: Payer: Self-pay | Admitting: Internal Medicine

## 2018-08-18 ENCOUNTER — Other Ambulatory Visit: Payer: Self-pay | Admitting: *Deleted

## 2018-08-18 VITALS — BP 134/80 | HR 80 | Temp 97.9°F | Resp 16 | Ht 66.0 in | Wt 256.5 lb

## 2018-08-18 DIAGNOSIS — R002 Palpitations: Secondary | ICD-10-CM

## 2018-08-18 DIAGNOSIS — L309 Dermatitis, unspecified: Secondary | ICD-10-CM | POA: Diagnosis not present

## 2018-08-18 DIAGNOSIS — I10 Essential (primary) hypertension: Secondary | ICD-10-CM

## 2018-08-18 DIAGNOSIS — F418 Other specified anxiety disorders: Secondary | ICD-10-CM

## 2018-08-18 DIAGNOSIS — Z23 Encounter for immunization: Secondary | ICD-10-CM | POA: Diagnosis not present

## 2018-08-18 MED ORDER — ALPRAZOLAM 0.25 MG PO TABS: 0.2500 mg | ORAL_TABLET | Freq: Two times a day (BID) | ORAL | 1 refills | Status: DC | PRN

## 2018-08-18 MED ORDER — CRISABOROLE 2 % EX OINT: 1.0000 | TOPICAL_OINTMENT | Freq: Two times a day (BID) | CUTANEOUS | 3 refills | Status: DC

## 2018-08-18 MED ORDER — ZOSTER VAC RECOMB ADJUVANTED 50 MCG/0.5ML IM SUSR: 0.5000 mL | Freq: Once | INTRAMUSCULAR | 1 refills | Status: AC

## 2018-08-18 NOTE — Patient Instructions (Signed)
Palpitations A palpitation is the feeling that your heartbeat is irregular or is faster than normal. It may feel like your heart is fluttering or skipping a beat. Palpitations are usually not a serious problem. They may be caused by many things, including smoking, caffeine, alcohol, stress, and certain medicines. Although most causes of palpitations are not serious, palpitations can be a sign of a serious medical problem. In some cases, you may need further medical evaluation. Follow these instructions at home: Pay attention to any changes in your symptoms. Take these actions to help with your condition:  Avoid the following: ? Caffeinated coffee, tea, soft drinks, diet pills, and energy drinks. ? Chocolate. ? Alcohol.  Do not use any tobacco products, such as cigarettes, chewing tobacco, and e-cigarettes. If you need help quitting, ask your health care provider.  Try to reduce your stress and anxiety. Things that can help you relax include: ? Yoga. ? Meditation. ? Physical activity, such as swimming, jogging, or walking. ? Biofeedback. This is a method that helps you learn to use your mind to control things in your body, such as your heartbeats.  Get plenty of rest and sleep.  Take over-the-counter and prescription medicines only as told by your health care provider.  Keep all follow-up visits as told by your health care provider. This is important.  Contact a health care provider if:  You continue to have a fast or irregular heartbeat after 24 hours.  Your palpitations occur more often. Get help right away if:  You have chest pain or shortness of breath.  You have a severe headache.  You feel dizzy or you faint. This information is not intended to replace advice given to you by your health care provider. Make sure you discuss any questions you have with your health care provider. Document Released: 11/27/2000 Document Revised: 05/04/2016 Document Reviewed: 08/15/2015 Elsevier  Interactive Patient Education  2018 Elsevier Inc.  

## 2018-08-18 NOTE — Progress Notes (Addendum)
Subjective:  Patient ID: Valerie [name], female    DOB: 07-04-1946  Age: 72 y.o. MRN: [id number]  CC: Rash and Palpitations   HPI Valerie [name] presents for f/up- the rash persists without change. The biopsy was c/w eczema. She is not treating this. She continues to c/o the sensation that her heart is fluttering and she has chronic SOB and CP. She has had an extensive CV work-up previous;y.  Outpatient Medications Prior to Visit  Medication Sig Dispense Refill  . albuterol (PROVENTIL) (2.5 MG/3ML) 0.083% nebulizer solution     . albuterol (VENTOLIN HFA) 108 (90 Base) MCG/ACT inhaler Inhale 2 puffs into the lungs every 6 (six) hours as needed. For shortness of breath 3 Inhaler 4  . amLODipine (NORVASC) 10 MG tablet TAKE 1 TABLET BY MOUTH ONCE DAILY 90 tablet 1  . aspirin 81 MG tablet Take 81 mg by mouth daily.    [name] atorvastatin (LIPITOR) 20 MG tablet TAKE 1 TABLET BY MOUTH ONCE DAILY 90 tablet 1  . carvedilol (COREG) 25 MG tablet TAKE 1 TABLET BY MOUTH TWICE DAILY WITH MEALS 180 tablet 1  . chlorthalidone (HYGROTON) 25 MG tablet TAKE 1 TABLET BY MOUTH ONCE DAILY 90 tablet 1  . Cholecalciferol 50000 units capsule Take 1 capsule (50,000 Units total) by mouth once a week. 12 capsule 1  . cyanocobalamin 2000 MCG tablet Take 1 tablet (2,000 mcg total) by mouth daily. 90 tablet 1  . empagliflozin (JARDIANCE) 25 MG TABS tablet Take 25 mg by mouth daily. 90 tablet 1  . ferrous sulfate 325 (65 FE) MG tablet Take 1 tablet (325 mg total) by mouth 2 (two) times daily with a meal. 180 tablet 1  . FREESTYLE TEST STRIPS test strip USE TO TEST BLOOD SUGAR UP TO 3 TIMES A DAY 100 each 11  . gabapentin (NEURONTIN) 300 MG capsule TAKE 1 CAPSULE BY MOUTH THREE TIMES DAILY 270 capsule 1  . Insulin Pen Needle 31G X 5 MM MISC Use once daily with insulin 100 each 3  . irbesartan (AVAPRO) 300 MG tablet TAKE 1 TABLET BY MOUTH ONCE DAILY    . latanoprost (XALATAN) 0.005 % ophthalmic solution Place 1 drop into  both eyes at bedtime.    . lidocaine (XYLOCAINE) 2 % solution     . olmesartan (BENICAR) 40 MG tablet Take 1 tablet (40 mg total) by mouth daily. 90 tablet 1  . omeprazole (PRILOSEC) 40 MG capsule TAKE 1 CAPSULE BY MOUTH ONCE DAILY 90 capsule 1  . oxyCODONE-acetaminophen (PERCOCET) 7.5-325 MG tablet Take 1 tablet by mouth every 6 (six) hours as needed for severe pain. 90 tablet 0  . potassium chloride SA (K-DUR,KLOR-CON) 20 MEQ tablet Take 1 tablet (20 mEq total) by mouth 2 (two) times daily. 180 tablet 1  . sertraline (ZOLOFT) 100 MG tablet Take 1 tablet (100 mg total) by mouth daily. (Patient not taking: Reported on 08/18/2018) 90 tablet 3  . SitaGLIPtin-MetFORMIN HCl (JANUMET XR) [phone] MG TB24 Take 1 tablet by mouth daily. 90 tablet 1  . TOUJEO SOLOSTAR 300 UNIT/ML SOPN INJECT 30 UNITS SUBCUTANEOUSLY ONCE DAILY 6 mL 5   No facility-administered medications prior to visit.     ROS Review of Systems  Constitutional: Negative for diaphoresis and fatigue.  HENT: Negative.  Negative for sore throat and trouble swallowing.   Eyes: Negative for visual disturbance.  Respiratory: Positive for shortness of breath. Negative for cough, chest tightness and wheezing.   Cardiovascular: Positive for chest pain  and palpitations. Negative for leg swelling.       Cp = "dull ache"  Gastrointestinal: Negative for abdominal pain, constipation, diarrhea, nausea and vomiting.  Endocrine: Negative.  Negative for polydipsia, polyphagia and polyuria.  Genitourinary: Negative.  Negative for difficulty urinating.  Musculoskeletal: Negative.  Negative for arthralgias, joint swelling and myalgias.  Skin: Positive for rash.  Neurological: Negative.  Negative for dizziness.  Hematological: Negative for adenopathy. Does not bruise/bleed easily.  Psychiatric/Behavioral: Positive for sleep disturbance. Negative for behavioral problems, confusion, decreased concentration, dysphoric mood and suicidal ideas. The patient is  nervous/anxious.     Objective:  BP 134/80 (BP Location: Left Arm, Patient Position: Sitting, Cuff Size: Large)   Pulse 80   Temp 97.9 F (36.6 C) (Oral)   Resp 16   Ht 5\' 6"  (1.676 m)   Wt 256 lb 8 oz (116.3 kg)   SpO2 98%   BMI 41.40 kg/m   BP Readings from Last 3 Encounters:  08/18/18 134/80  08/11/18 (!) 148/94  07/15/18 122/70    Wt Readings from Last 3 Encounters:  08/18/18 256 lb 8 oz (116.3 kg)  08/11/18 258 lb (117 kg)  07/15/18 254 lb 1.3 oz (115.2 kg)    Physical Exam  Constitutional: She is oriented to person, place, and time. No distress.  HENT:  Mouth/Throat: Oropharynx is clear and moist. No oropharyngeal exudate.  Eyes: Conjunctivae are normal. No scleral icterus.  Neck: Normal range of motion. Neck supple. No JVD present. No thyromegaly present.  Cardiovascular: Normal rate, regular rhythm and normal heart sounds. Exam reveals no gallop and no friction rub.  No murmur heard. EKG ---  Sinus  Rhythm  -  Nonspecific T-abnormality.   ABNORMAL - no change compared to the prior EKG  Pulmonary/Chest: Effort normal and breath sounds normal. No respiratory distress. She has no wheezes. She has no rales.  Abdominal: Soft. Bowel sounds are normal. She exhibits no mass. There is no hepatosplenomegaly. There is no tenderness.  Musculoskeletal: Normal range of motion. She exhibits no edema, tenderness or deformity.  Lymphadenopathy:    She has no cervical adenopathy.  Neurological: She is alert and oriented to person, place, and time.  Skin: Skin is warm and dry. Rash noted. She is not diaphoretic. No pallor.  Psychiatric: Her speech is normal and behavior is normal. Judgment and thought content normal. Her mood appears anxious. Cognition and memory are normal. She does not exhibit a depressed mood.  Vitals reviewed.   Lab Results  Component Value Date   WBC 11.4 (H) 08/11/2018   HGB 10.6 (L) 08/11/2018   HCT 32.6 (L) 08/11/2018   PLT 380.0 08/11/2018    GLUCOSE 157 (H) 08/11/2018   CHOL 135 10/06/2017   TRIG 261.0 (H) 10/06/2017   HDL 46.30 10/06/2017   LDLDIRECT 55.0 10/06/2017   LDLCALC 43 03/28/2014   ALT 18 08/11/2018   AST 17 08/11/2018   NA 142 08/11/2018   K 3.7 08/11/2018   CL 104 08/11/2018   CREATININE 0.97 08/11/2018   BUN 11 08/11/2018   CO2 30 08/11/2018   TSH 2.33 06/20/2018   INR 1.06 08/29/2015   HGBA1C 7.2 (H) 06/20/2018   MICROALBUR <0.7 06/20/2018    Dg Chest 2 View  Result Date: 04/16/2016 CLINICAL DATA:  Recent diagnosis of pneumonia. Coughing and wheezing and shortness of breath for 2 weeks. History of hypertension. EXAM: CHEST  2 VIEW COMPARISON:  08/29/2015 FINDINGS: Cardiac silhouette is top-normal in size. No mediastinal or hilar  masses or evidence of adenopathy. Clear lungs.  No pleural effusion or pneumothorax. Bony thorax is intact. IMPRESSION: No active cardiopulmonary disease. Electronically Signed   By: Lajean Manes M.D.   On: 04/16/2016 15:36    BX results: There is acanthosis with foci of slight spongiosis and parakeratosis. An infiltrate composed predominantly of lymphocytes is present around the superficial vascular plexus. A PAS stain is negative for fungi. The findings are most consistent with a chronic eczematous dermatitis such as contact, nummular or atopic dermatitis. Multiple sections have been examined    Assessment & Plan:   Valerie [name] was seen today for rash and palpitations.  Diagnoses and all orders for this visit:  Eczema, unspecified type- Will treat with Eucrisa -     Crisaborole (EUCRISA) 2 % OINT; Apply 1 Act topically 2 (two) times daily.  Need for influenza vaccination -     Flu vaccine HIGH DOSE PF (Fluzone High dose)  Essential hypertension, benign - her BP is well controlled  Rapid palpitations- This sounds benign to me. I have asked her to f/up with her cardiologist. -     Ambulatory referral to Cardiology  Depression with anxiety -     ALPRAZolam (XANAX) 0.25  MG tablet; Take 1 tablet (0.25 mg total) by mouth 2 (two) times daily as needed for anxiety.  Palpitations -     EKG 12-Lead  Need for shingles vaccine -     Zoster Vaccine Adjuvanted [facility]) injection; Inject 0.5 mLs into the muscle once for 1 dose.   I am having Valerie [name] start on Crisaborole, ALPRAZolam, and Zoster Vaccine Adjuvanted. I am also having her maintain her latanoprost, aspirin, Insulin Pen Needle, albuterol, sertraline, amLODipine, chlorthalidone, FREESTYLE TEST STRIPS, SitaGLIPtin-MetFORMIN HCl, TOUJEO SOLOSTAR, ferrous sulfate, potassium chloride SA, albuterol, omeprazole, atorvastatin, carvedilol, olmesartan, oxyCODONE-acetaminophen, irbesartan, lidocaine, gabapentin, empagliflozin, Cholecalciferol, and cyanocobalamin.  Meds ordered this encounter  Medications  . Crisaborole (EUCRISA) 2 % OINT    Sig: Apply 1 Act topically 2 (two) times daily.    Dispense:  100 g    Refill:  3  . ALPRAZolam (XANAX) 0.25 MG tablet    Sig: Take 1 tablet (0.25 mg total) by mouth 2 (two) times daily as needed for anxiety.    Dispense:  180 tablet    Refill:  1  . Zoster Vaccine Adjuvanted [facility]) injection    Sig: Inject 0.5 mLs into the muscle once for 1 dose.    Dispense:  0.5 mL    Refill:  1     Follow-up: Return in about 4 months (around 12/18/2018).  [name], MD

## 2018-08-18 NOTE — Patient Outreach (Signed)
[facility]) Care Management  08/18/2018  Valerie [name] 05-26-1946 [id number]   [place] Monthly Outreach  Referral Date:04/15/2018 Referral Source:Nurse Call Line Screening Reason for Referral:Disease Management Education/Chronic Care Improvement Program Insurance:[employer]   Outreach Attempt:  Successful telephone outreach to patient for monthly follow up.  HIPAA verified with patient.  Patient reporting continued not feeling well with light headiness and dizziness, now with palpitations.  Is awaiting Cardiologist referral.  Patient reporting she had skin biopsy done last week, with sutures removed today and was told her rash was Eczema.  Ointment prescribed patient stating she is unable to afford the co pay but does have sample from MD office.  [place] updated on need for medication assistance.  Discussed with patient decreasing life stress and possibility of husband using [employer] for transportation to hemodialysis appointments to reduce her stress.  Also discussed with patient the importance of stress reduction as it relates to eczema and palpitations.  Continues to monitor blood sugars.  Fasting blood sugar this morning was 139 with ranges of 120-130's.  Denies any falls, but reports multiple stumbles.  Fall precautions and preventions reviewed and discussed; and patient encouraged to utilize cane or walker to help with balance.  Appointments:  Patient attended medical appointment today with Dr. Ronnald Ramp and needs to schedule follow up appointment.  Encouraged to schedule appointment with primary care provider.  Awaiting Cardiology consultation.  Plan: RN Health Coach will make next monthly outreach to patient in the month of October. RN Health Coach will send patient EMMI on Eczema (Atopic Dermatitis). RN Health Coach will send patient EMMI on Vitamin D Deficiency. RN Health Coach will send patient EMMI on Vitamin B12 Deficiency and  Folic Acid Deficiency. RN Health Coach will send [place] message to update patient needing assistance with co pay for new medication.   [place] [phone] [email or web address]

## 2018-08-19 LAB — B. BURGDORFI ANTIBODIES BY WB
B burgdorferi IgG Abs (IB): NEGATIVE
B burgdorferi IgM Abs (IB): NEGATIVE
Lyme Disease 18 kD IgG: REACTIVE — AB
Lyme Disease 23 kD IgG: NONREACTIVE
Lyme Disease 23 kD IgM: NONREACTIVE
Lyme Disease 28 kD IgG: NONREACTIVE
Lyme Disease 30 kD IgG: NONREACTIVE
Lyme Disease 39 kD IgG: NONREACTIVE
Lyme Disease 39 kD IgM: NONREACTIVE
Lyme Disease 41 kD IgG: NONREACTIVE
Lyme Disease 41 kD IgM: NONREACTIVE
Lyme Disease 45 kD IgG: NONREACTIVE
Lyme Disease 58 kD IgG: NONREACTIVE
Lyme Disease 66 kD IgG: NONREACTIVE
Lyme Disease 93 kD IgG: REACTIVE — AB

## 2018-08-24 ENCOUNTER — Other Ambulatory Visit: Payer: Self-pay | Admitting: Pharmacy Technician

## 2018-08-24 ENCOUNTER — Other Ambulatory Visit: Payer: Self-pay | Admitting: Pharmacist

## 2018-08-24 NOTE — Patient Outreach (Signed)
[name]) Care Management  08/24/2018  Valerie [name] 09/28/1946 [id number]   Received [employer] patient assistance referral from [place] for [employer]. Prepared patient portion of application to be mailed and faxed provider portion to Dr. Ronnald Ramp.  Will follow up with patient in 7-10 business days to confirm application has been received.  [name], [place] [facility] [phone]

## 2018-08-24 NOTE — Patient Outreach (Addendum)
[facility]) Care Management  08/24/2018  Valerie [name] Aug 19, 1946 [id number]   72 year old female referred to [place] for medication assistance for Eucrisa.  Patient well known to pharmacy for previous medication assistance May - August 2019.    Successful call placed to Valerie [name] today.  HIPAA identifiers verified. Patient reports she is not doing great as her husband, Valerie [name], is currently hospitalized.  She states she received 5 small sample tubes of Eucrisa from PCP for eczema rash and has used about 1 1/2 tubes in the last week.  She reports her rash has gotten much better and is almost gone.  She was told the medication would cost >$100 due to being in the coverage gap.  Medication assistance:  Per review of patient's insurance: Eucrisa 2% topical ointment = Tier 4 HTA Tier 4 medications = $90 co-pay (mail order $180 co-pay) Patient currently in coverage gap  Medication assistance options: 1)Tier exception form to reduce medication Tier 4 --> Tier 3 however patient currently using patient assistance programs for other Tier 3 medications, therefore even if Tier lowered, patient likely cannot still afford.   2)Patient assistance program: No program exists for patients with Medicare at this time.  3)PAN foundation: Disease program not offered 4)HealthWell foundation: Medication not offered  5)Extra Help: Does not qualify based on reported income  I reviewed with patient all options as listed above and recommended that she either continue to use samples when needed or request an alternative from provider.  Patient voiced understanding.   Patient requests medication assistance with Toujeo which is a medication we have previously discussed. I reviewed the 5% TROOP requirement and patient would like to start application process as she feels she may be close to this amount.  Per HTA records, patient has spent ~$750 thus far.   Plan: I will route patient  assistance letter to pharmacy technician, Valerie [name], who will coordinate application process for Toujeo through [place].  She will assist with obtaining all pertinent documents from both patient and Dr. Ronnald [name] and submit application once completed.    Ralene Bathe, PharmD, [place] [phone]

## 2018-08-31 ENCOUNTER — Telehealth: Payer: Self-pay

## 2018-08-31 DIAGNOSIS — G4733 Obstructive sleep apnea (adult) (pediatric): Secondary | ICD-10-CM | POA: Diagnosis not present

## 2018-08-31 NOTE — Telephone Encounter (Signed)
PA completed for [place]. I was not able to reduce the price in a tier exception. Pt informed and is requesting alternative medication.

## 2018-09-05 ENCOUNTER — Other Ambulatory Visit: Payer: Self-pay | Admitting: Internal Medicine

## 2018-09-05 DIAGNOSIS — L309 Dermatitis, unspecified: Secondary | ICD-10-CM

## 2018-09-05 MED ORDER — FLUOCINONIDE-E 0.05 % EX CREA: 1.0000 "application " | TOPICAL_CREAM | Freq: Two times a day (BID) | CUTANEOUS | 1 refills | Status: DC

## 2018-09-12 DIAGNOSIS — M542 Cervicalgia: Secondary | ICD-10-CM | POA: Diagnosis not present

## 2018-09-12 DIAGNOSIS — M25511 Pain in right shoulder: Secondary | ICD-10-CM | POA: Diagnosis not present

## 2018-09-14 ENCOUNTER — Other Ambulatory Visit: Payer: Self-pay | Admitting: Internal Medicine

## 2018-09-14 DIAGNOSIS — I10 Essential (primary) hypertension: Secondary | ICD-10-CM

## 2018-09-14 DIAGNOSIS — E118 Type 2 diabetes mellitus with unspecified complications: Secondary | ICD-10-CM

## 2018-09-15 ENCOUNTER — Ambulatory Visit: Payer: PPO | Admitting: *Deleted

## 2018-09-19 ENCOUNTER — Other Ambulatory Visit: Payer: Self-pay | Admitting: Pharmacist

## 2018-09-19 NOTE — Patient Outreach (Signed)
[facility]) Care Management  09/19/2018  Valerie [name] 1946-12-07 [id number]   Reason for referral: medication assistance  Unsuccessful telephone call attempt #1 to patient.   -Unable to leave message.    Plan:    I will make another outreach attempt to patient within 3-4 business days.     Ralene Bathe, PharmD, [place] [phone]

## 2018-09-21 ENCOUNTER — Encounter: Payer: Self-pay | Admitting: Cardiovascular Disease

## 2018-09-21 ENCOUNTER — Other Ambulatory Visit: Payer: Self-pay | Admitting: *Deleted

## 2018-09-21 ENCOUNTER — Ambulatory Visit: Payer: PPO | Admitting: Cardiovascular Disease

## 2018-09-21 VITALS — BP 136/78 | HR 79 | Ht 66.0 in | Wt 252.4 lb

## 2018-09-21 DIAGNOSIS — I200 Unstable angina: Secondary | ICD-10-CM

## 2018-09-21 DIAGNOSIS — R0789 Other chest pain: Secondary | ICD-10-CM

## 2018-09-21 DIAGNOSIS — R002 Palpitations: Secondary | ICD-10-CM | POA: Diagnosis not present

## 2018-09-21 DIAGNOSIS — R0602 Shortness of breath: Secondary | ICD-10-CM

## 2018-09-21 NOTE — Assessment & Plan Note (Signed)
History of atypical chest pain with a -2-day protocol Myoview 3 years ago when she was evaluated by Dr. Oval Linsey.  The pain however recently is more left-sided.  Given her strong family history and other risk factors I am going to repeat a functional study.

## 2018-09-21 NOTE — Patient Instructions (Signed)
Medication Instructions:  Your physician recommends that you continue on your current medications as directed. Please refer to the Current Medication list given to you today.  If you need a refill on your cardiac medications before your next appointment, please call your pharmacy.   Lab work: none If you have labs (blood work) drawn today and your tests are completely normal, you will receive your results only by: [name] MyChart Message (if you have MyChart) OR . A paper copy in the mail If you have any lab test that is abnormal or we need to change your treatment, we will call you to review the results.  Testing/Procedures: Your physician has requested that you have a lexiscan myoview. For further information please visit [email or web address]. Please follow instruction sheet, as given.  Your physician has requested that you have an echocardiogram. Echocardiography is a painless test that uses sound waves to create images of your heart. It provides your doctor with information about the size and shape of your heart and how well your heart's chambers and valves are working. This procedure takes approximately one hour. There are no restrictions for this procedure.  Your physician has recommended that you wear an 30 day event monitor. Event monitors are medical devices that record the heart's electrical activity. Doctors most often [place] these monitors to diagnose arrhythmias. Arrhythmias are problems with the speed or rhythm of the heartbeat. The monitor is a small, portable device. You can wear one while you do your normal daily activities. This is usually used to diagnose what is causing palpitations/syncope (passing out).    Follow-Up: At [facility], you and your health needs are our priority.  As part of our continuing mission to provide you with exceptional heart care, we have created designated Provider Care Teams.  These Care Teams include your primary Cardiologist (physician) and Advanced  Practice Providers (APPs -  Physician Assistants and Nurse Practitioners) who all work together to provide you with the care you need, when you need it. You will need a follow up appointment in 8- 12 weeks.  Please call our office 2 months in advance to schedule this appointment.  You may see Dr. Gwenlyn Found or one of the following Advanced Practice Providers on your designated Care Team:   Kerin Ransom, PA-C Roby Lofts, [place] . Sande Rives, PA-C  Any Other Special Instructions Will Be Listed Below (If Applicable).

## 2018-09-21 NOTE — Assessment & Plan Note (Signed)
History of essential hypertension her blood pressure measured at 136/78.  She is on amlodipine, carvedilol, chlorthalidone and Avapro.

## 2018-09-21 NOTE — Progress Notes (Signed)
09/21/2018 Valerie [name]   Jul 15, 1946  [id number]  Primary Physician Janith Lima, MD Primary Cardiologist: Lorretta Harp MD Valerie [name], [place]  HPI:  ZYAIR RHEIN is a 72 y.o. morbidly overweight married African-American female mother 23, grandmother of 5 grandchildren who was in the textile industry and was referred by [name] for cardiovascular evaluation because of palpitations.  She has been evaluated by Drs. Hochrein and Oval Linsey in the past.  She saw Dr. Oval Linsey a little over 3 years ago which time she had an echo that was essentially normal and a 2-day protocol Myoview that was normal as well.  She does have a history of treated hypertension, diabetes and hyperlipidemia.  She wears BiPAP for obstructive sleep apnea and his family history for heart disease with both father and brother who died of myocardial infarctions.  He is never had a heart attack or stroke.  She does get chronic chest pain every several days and is chronically short of breath.  She also has reactive airways disease.  He started having palpitations several months ago and these occur several times a week lasting seconds to minutes at a time.   Current Meds  Medication Sig  . albuterol (PROVENTIL) (2.5 MG/3ML) 0.083% nebulizer solution   . albuterol (VENTOLIN HFA) 108 (90 Base) MCG/ACT inhaler Inhale 2 puffs into the lungs every 6 (six) hours as needed. For shortness of breath  . ALPRAZolam (XANAX) 0.25 MG tablet Take 1 tablet (0.25 mg total) by mouth 2 (two) times daily as needed for anxiety.  [name] amLODipine (NORVASC) 10 MG tablet TAKE 1 TABLET BY MOUTH ONCE DAILY  . aspirin 81 MG tablet Take 81 mg by mouth daily.  [name] atorvastatin (LIPITOR) 20 MG tablet TAKE 1 TABLET BY MOUTH ONCE DAILY  . carvedilol (COREG) 25 MG tablet TAKE 1 TABLET BY MOUTH TWICE DAILY WITH MEALS  . chlorthalidone (HYGROTON) 25 MG tablet Take 1 tablet (25 mg total) by mouth daily.  . Cholecalciferol 50000 units capsule  Take 1 capsule (50,000 Units total) by mouth once a week.  . cyanocobalamin 2000 MCG tablet Take 1 tablet (2,000 mcg total) by mouth daily.  . empagliflozin (JARDIANCE) 25 MG TABS tablet Take 25 mg by mouth daily.  . ferrous sulfate 325 (65 FE) MG tablet Take 1 tablet (325 mg total) by mouth 2 (two) times daily with a meal.  . fluocinonide-emollient (LIDEX-E) 0.05 % cream Apply 1 application topically 2 (two) times daily.  [name] FREESTYLE TEST STRIPS test strip USE TO TEST BLOOD SUGAR UP TO 3 TIMES A DAY  . gabapentin (NEURONTIN) 300 MG capsule TAKE 1 CAPSULE BY MOUTH THREE TIMES DAILY  . Insulin Pen Needle 31G X 5 MM MISC Use once daily with insulin  . irbesartan (AVAPRO) 300 MG tablet TAKE 1 TABLET BY MOUTH ONCE DAILY  . latanoprost (XALATAN) 0.005 % ophthalmic solution Place 1 drop into both eyes at bedtime.  . lidocaine (XYLOCAINE) 2 % solution   . olmesartan (BENICAR) 40 MG tablet Take 1 tablet (40 mg total) by mouth daily.  [name] omeprazole (PRILOSEC) 40 MG capsule TAKE 1 CAPSULE BY MOUTH ONCE DAILY  . oxyCODONE-acetaminophen (PERCOCET) 7.5-325 MG tablet Take 1 tablet by mouth every 6 (six) hours as needed for severe pain.  . potassium chloride SA (K-DUR,KLOR-CON) 20 MEQ tablet Take 1 tablet (20 mEq total) by mouth 2 (two) times daily.  . SitaGLIPtin-MetFORMIN HCl (JANUMET XR) [phone] MG TB24 Take 1 tablet by  mouth daily.  . TOUJEO SOLOSTAR 300 UNIT/ML SOPN INJECT 30 UNITS SUBCUTANEOUSLY ONCE DAILY     Allergies  Allergen Reactions  . Food Swelling    bananas  . Penicillins Swelling and Rash    Social History   Socioeconomic History  . Marital status: Married    Spouse name: Not on file  . Number of children: 3  . Years of education: 11th  . Highest education level: Not on file  Occupational History  . Occupation: disabled  Social Needs  . Financial resource strain: Not hard at all  . Food insecurity:    Worry: Never true    Inability: Never true  . Transportation needs:     Medical: No    Non-medical: No  Tobacco Use  . Smoking status: Never Smoker  . Smokeless tobacco: Never Used  Substance and Sexual Activity  . Alcohol use: No    Alcohol/week: 0.0 standard drinks  . Drug use: No  . Sexual activity: Not Currently    Comment: not working, lives with husband, 3 sons  Lifestyle  . Physical activity:    Days per week: 0 days    Minutes per session: 0 min  . Stress: Very much  Relationships  . Social connections:    Talks on phone: Not on file    Gets together: Not on file    Attends religious service: Not on file    Active member of club or organization: Not on file    Attends meetings of clubs or organizations: Not on file    Relationship status: Married  . Intimate partner violence:    Fear of current or ex partner: Not on file    Emotionally abused: Not on file    Physically abused: Not on file    Forced sexual activity: Not on file  Other Topics Concern  . Not on file  Social History Narrative   Lives with spouse; spouse disabled and is on dialysis ; also caregiver for son who is 16 yo and is disabled.     Review of Systems: General: negative for chills, fever, night sweats or weight changes.  Cardiovascular: negative for chest pain, dyspnea on exertion, edema, orthopnea, palpitations, paroxysmal nocturnal dyspnea or shortness of breath Dermatological: negative for rash Respiratory: negative for cough or wheezing Urologic: negative for hematuria Abdominal: negative for nausea, vomiting, diarrhea, bright red blood per rectum, melena, or hematemesis Neurologic: negative for visual changes, syncope, or dizziness All other systems reviewed and are otherwise negative except as noted above.    Blood pressure 136/78, pulse 79, height 5\' 6"  (1.676 m), weight 252 lb 6.4 oz (114.5 kg).  General appearance: alert and no distress Neck: no adenopathy, no carotid bruit, no JVD, supple, symmetrical, trachea midline and thyroid not enlarged,  symmetric, no tenderness/mass/nodules Lungs: clear to auscultation bilaterally Heart: regular rate and rhythm, S1, S2 normal, no murmur, click, rub or gallop Extremities: extremities normal, atraumatic, no cyanosis or edema Pulses: 2+ and symmetric Skin: Skin color, texture, turgor normal. No rashes or lesions Neurologic: Alert and oriented X 3, normal strength and tone. Normal symmetric reflexes. Normal coordination and gait  EKG sinus rhythm at 79 ST or T wave changes.  Personally reviewed this EKG.  ASSESSMENT AND PLAN:   Hyperlipidemia with target LDL less than 100 History of hyperlipidemia on statin therapy with lipid profile performed 10/06/2017 revealing total cholesterol 135, LDL 55 HDL 46.  Essential hypertension, benign History of essential hypertension her blood pressure measured at  136/78.  She is on amlodipine, carvedilol, chlorthalidone and Avapro.  Palpitations She palpitations for last several months occurring several times a week lasting minutes at a time sometimes associated with dizziness.  We will obtain a 30-day event monitor to further evaluate.  Atypical chest pain History of atypical chest pain with a -2-day protocol Myoview 3 years ago when she was evaluated by Dr. Oval Linsey.  The pain however recently is more left-sided.  Given her strong family history and other risk factors I am going to repeat a functional study.      Lorretta Harp MD FACP,FACC,FAHA, [facility] 09/21/2018 12:08 PM

## 2018-09-21 NOTE — Patient Outreach (Signed)
[facility]) Care Management  09/21/2018  [name] 11/26/1946 [id number]   [place] Monthly Outreach  Referral Date:04/15/2018 Referral Source:Nurse Call Line Screening Reason for Referral:Disease Management Education/Chronic Care Improvement Program Insurance:[employer]   Outreach Attempt:  Outreach attempt #1 to patient for monthly follow up. No answer. RN Health Coach left HIPAA compliant voicemail message along with contact information.  Plan:  RN Health Coach will make another outreach attempt within the month of October.  [place] [phone] [email or web address] [name]

## 2018-09-21 NOTE — Assessment & Plan Note (Signed)
History of hyperlipidemia on statin therapy with lipid profile performed 10/06/2017 revealing total cholesterol 135, LDL 55 HDL 46.

## 2018-09-21 NOTE — Assessment & Plan Note (Signed)
She palpitations for last several months occurring several times a week lasting minutes at a time sometimes associated with dizziness.  We will obtain a 30-day event monitor to further evaluate.

## 2018-09-28 ENCOUNTER — Other Ambulatory Visit: Payer: Self-pay | Admitting: Pharmacist

## 2018-09-28 NOTE — Patient Outreach (Signed)
[facility]) Care Management  [place] 09/28/2018  Valerie [name] 1946/05/26 [id number]  Reason for referral: medication assistance  Follow-up call to Valerie [name] regarding Toujeo PAP.  Documents have not been returned to [facility] yet.  Patient reports she is no longer interested in applying for Toujeo in 2019.  She requests that [facility] assist her with re-applying for patient assistance for Janumet, Jardiance, and Albuterol inhaler.  She denies any other medication needs at this time.   Discussed application process for [employer] and Nitro with [facility].  Companies have not provided exact date after which patients may submit applications.    Plan: [facility] will follow-up with companies and with Valerie [name] in November / December 2019 to assist with re-applying for PAPs in 2020. I will close Valerie [name]'s pharmacy case at this time.

## 2018-10-03 ENCOUNTER — Ambulatory Visit (INDEPENDENT_AMBULATORY_CARE_PROVIDER_SITE_OTHER): Payer: PPO

## 2018-10-03 ENCOUNTER — Ambulatory Visit (HOSPITAL_COMMUNITY): Payer: PPO | Attending: Cardiovascular Disease

## 2018-10-03 ENCOUNTER — Other Ambulatory Visit: Payer: Self-pay

## 2018-10-03 DIAGNOSIS — R0602 Shortness of breath: Secondary | ICD-10-CM | POA: Diagnosis not present

## 2018-10-03 DIAGNOSIS — R002 Palpitations: Secondary | ICD-10-CM | POA: Insufficient documentation

## 2018-10-03 DIAGNOSIS — R0789 Other chest pain: Secondary | ICD-10-CM | POA: Insufficient documentation

## 2018-10-04 ENCOUNTER — Encounter: Payer: Self-pay | Admitting: *Deleted

## 2018-10-04 ENCOUNTER — Other Ambulatory Visit: Payer: Self-pay | Admitting: *Deleted

## 2018-10-04 ENCOUNTER — Telehealth (HOSPITAL_COMMUNITY): Payer: Self-pay

## 2018-10-04 DIAGNOSIS — G4733 Obstructive sleep apnea (adult) (pediatric): Secondary | ICD-10-CM | POA: Diagnosis not present

## 2018-10-04 NOTE — Telephone Encounter (Signed)
Encounter complete. 

## 2018-10-04 NOTE — Patient Outreach (Signed)
[facility]) Care Management  10/04/2018  Valerie [name] November 04, 1946 [id number]   [place] Monthly Outreach  Referral Date:04/15/2018 Referral Source:Nurse Call Line Screening Reason for Referral:Disease Management Education/Chronic Care Improvement Program Insurance:[employer]   Outreach Attempt:  Successful telephone outreach to patient for monthly follow up.  HIPAA confirmed with patient.  Patient reporting complete healing of rash/eczema.  Stating she continues to have dizziness and feels tired.  Continues to report stress caring for husband and other family and church duties.  States husband is currently hospitalized.  Has seen Cardiologist and obtained echocardiogram yesterday.  Also has Holter monitor placed on yesterday.  Reports she is to have stress test completed later this week also.  Fasting blood sugar this morning 145 with ranges of 120-140's.  Denies any hypoglycemic episodes.  Appointments:  Reports last medical appointment with primary care provider was 08/18/2018 and should see primary care provider next in January 2020.  Attended Cardiology appointment on 09/21/2018.  Plan: RN Health Coach will make next telephone outreach to patient in the month of December.  [place] [phone] [name]

## 2018-10-05 ENCOUNTER — Inpatient Hospital Stay (HOSPITAL_COMMUNITY): Admission: RE | Admit: 2018-10-05 | Payer: PPO | Source: Ambulatory Visit

## 2018-10-06 ENCOUNTER — Ambulatory Visit (HOSPITAL_COMMUNITY)
Admission: RE | Admit: 2018-10-06 | Discharge: 2018-10-06 | Disposition: A | Discharge status: Home / Self Care | Payer: PPO | Source: Ambulatory Visit | Attending: Cardiovascular Disease | Admitting: Cardiovascular Disease

## 2018-10-06 ENCOUNTER — Ambulatory Visit (HOSPITAL_COMMUNITY): Payer: PPO

## 2018-10-06 DIAGNOSIS — R002 Palpitations: Secondary | ICD-10-CM | POA: Insufficient documentation

## 2018-10-06 DIAGNOSIS — R0789 Other chest pain: Secondary | ICD-10-CM | POA: Diagnosis not present

## 2018-10-06 DIAGNOSIS — R0602 Shortness of breath: Secondary | ICD-10-CM

## 2018-10-06 MED ORDER — TECHNETIUM TC 99M TETROFOSMIN IV KIT
30.8000 | PACK | Freq: Once | INTRAVENOUS | Status: DC | PRN
  Filled 2018-10-06: qty 31

## 2018-10-06 MED ORDER — REGADENOSON 0.4 MG/5ML IV SOLN: 0.4000 mg | Freq: Once | INTRAVENOUS | Status: DC

## 2018-10-07 ENCOUNTER — Ambulatory Visit (HOSPITAL_COMMUNITY): Payer: PPO

## 2018-10-07 ENCOUNTER — Ambulatory Visit (HOSPITAL_COMMUNITY)
Admission: RE | Admit: 2018-10-07 | Discharge: 2018-10-07 | Disposition: A | Discharge status: Home / Self Care | Payer: PPO | Source: Ambulatory Visit | Attending: Cardiology | Admitting: Cardiology

## 2018-10-07 LAB — MYOCARDIAL PERFUSION IMAGING
LV dias vol: 69 mL (ref 46–106)
LV sys vol: 17 mL
Peak HR: 91 {beats}/min
Rest HR: 74 {beats}/min
SDS: 0
SRS: 0
SSS: 0
TID: 1.28

## 2018-10-07 MED ORDER — TECHNETIUM TC 99M TETROFOSMIN IV KIT
29.2000 | PACK | Freq: Once | INTRAVENOUS | Status: AC | PRN
  Administered 2018-10-07: 29.2 via INTRAVENOUS

## 2018-10-21 ENCOUNTER — Other Ambulatory Visit: Payer: Self-pay | Admitting: Internal Medicine

## 2018-10-21 DIAGNOSIS — D509 Iron deficiency anemia, unspecified: Secondary | ICD-10-CM

## 2018-10-28 ENCOUNTER — Telehealth: Payer: Self-pay | Admitting: Cardiovascular Disease

## 2018-10-28 NOTE — Telephone Encounter (Signed)
New Message   Patient is calling because she was wearing a holter monitor and the strips are irritating her skin. So she stopped wearing it and she wanted the doctor to know. She has a rash and its peeling.

## 2018-10-28 NOTE — Telephone Encounter (Signed)
Returned call to patient who states that she was wearing a 30 day event monitor and the stickers started irritating her skin and causing her skin to peel. Patient was supposed to complete her monitor on Tuesday and did not want to order the sensitive skin stickers. Patient states that she did experience some of the irregular beats and Sx while wearing the monitor. Patient is going to go ahead and send her monitor back in.

## 2018-11-07 ENCOUNTER — Ambulatory Visit: Payer: PPO | Admitting: Nurse Practitioner

## 2018-11-09 ENCOUNTER — Other Ambulatory Visit: Payer: Self-pay | Admitting: Pharmacy Technician

## 2018-11-09 ENCOUNTER — Other Ambulatory Visit: Payer: Self-pay

## 2018-11-09 DIAGNOSIS — E118 Type 2 diabetes mellitus with unspecified complications: Secondary | ICD-10-CM

## 2018-11-09 MED ORDER — EMPAGLIFLOZIN 25 MG PO TABS: 25.0000 mg | ORAL_TABLET | Freq: Every day | ORAL | 3 refills | Status: DC

## 2018-11-09 NOTE — Patient Outreach (Addendum)
[facility] [facility]) Care Management  11/09/2018  Valerie [name] [birth year] [id number]   Successful call to patient in regards to reapplying to [place] for 2020. HIPAA identifiers verified. Patient is agreeable to applying for Jardiance, Janumet XR and Proventil HFA. Prepared patient portion of applications to be mailed and Provider portion of [employer] to be mailed to Dr. Ronnald Ramp and B-I app faxed to Dr. Ronnald Ramp.  Will follow up with patient in 5-7 business days to confirm applications have been received.  [name] Chana Bode [place] Certified Pharmacy Technician [place] Management Direct Dial:[phone]

## 2018-11-09 NOTE — Progress Notes (Signed)
[facility]  PATIENT: Valerie [name] DOB: 1946-08-08   REASON FOR VISIT: Follow-up for compliance BiPAP  HISTORY FROM: Patient    HISTORY OF PRESENT ILLNESS:UPDATE 12/2/2019CM Valerie [name], 72 year old female returns for follow-up with obstructive sleep apnea here for BiPAP compliance.  She awakens fresh in the mornings.  Compliance data dated 10/15/2018 to 11/13/2018 shows compliance greater than 4 hours at 100% pressure set 9 to 13 cm leak 95th percentile 4.6 AHI 0.2.  ESS 2 she returns for reevaluation   UPDATE 11/21/2018CM Valerie [name], 72 year old female returns for follow-up with history of obstructive sleep apnea with BiPAP.  Compliance data reviewed 10/03/2017 to 11/01/2017 with 100% compliance greater than 4 hours for 30 days.  Average usage 10 hours 27 minutes.  Set pressure of 9-13 cm AHI 0.2.  ESS 3.  She returns for reevaluation.  Her equipment company is [employer].  11/02/16 CDMrs. Valerie [name] [name], who was last seen 35 months ago. The patient brought her compliance data for her BiPAP machine with her and they're excellent she has used the machine 100% of the last 30 days on average 8 hours and 42 minutes, residual AHI is 0.3. The machine is set at 13/9 cm water with breeze function.   The patient is followed by [name].  She feels that her machine makes a significant difference, her Epworth sleepiness score is 3 points while  using BiPAP, she's not excessively daytime fatigue, she endorsed the geriatric depression score at 6/15 points, basically related to her caregiver burden.   Husband had cardiac complaints , suffered from post dialysis chest pain. 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 Emergency stent placed, but now he is unable from the CAD stand point to undergo necessary knee replacement.  Her husband is dependent on hemodialysis, and she feels that it is difficult to take care of herself , when her husband and son need her so much. Her son is  a 94 year old quadriparetic.   History:  09-14-2013 Valerie [name] is a 72 year old right-handed black female with a history of obesity, and obstructive sleep apnea on CPAP. The patient has report some excessive daytime drowsiness and some problems with memory and concentration. The patient has had no improvement of her memory, but she indicates that she has become intolerant of the CPAP mask, and she has not been able to use her CPAP machine for about one month. The patient has recently had left foot surgery. MRI done recently showed some minimal small vessel disease. The patient reports that in March of 2014, she had sudden onset of left-sided sensory changes with numbness, and she is also had some discomfort in the same distribution. The patient went to the emergency room, and MRI evaluation of the brain did not show an acute stroke. The patient is not aspirin or Plavix. The patient has some burning sensations of the left side of the face at times.  The patient comes to this office for an evaluation. Sleep Consult note : Valerie [name] is a mean of 72 year old African American right-handed lady patient of Dr. Floyde Parkins and Dr. Regis Bill,  [name]  The patient has a past medical history of morbid obesity and sleep apnea for which Dr. Jannifer Franklin referred her.  She reported increasing fatigue and excessive daytime sleepiness .    REVIEW OF SYSTEMS: Full 14 system review of systems performed and notable only for those listed, all others are neg:  Constitutional: neg  Cardiovascular: neg  Ear/Nose/Throat: neg  Skin: neg Eyes: neg Respiratory: neg Gastroitestinal: neg  Hematology/Lymphatic: neg  Endocrine: neg Musculoskeletal:neg Allergy/Immunology: neg Neurological: neg Psychiatric: neg Sleep : Obstructive sleep apnea with BiPAP   ALLERGIES: Allergies  Allergen Reactions  . Food Swelling    bananas  . Penicillins Swelling and Rash    HOME MEDICATIONS: Outpatient Medications Prior to  Visit  Medication Sig Dispense Refill  . albuterol (PROVENTIL) (2.5 MG/3ML) 0.083% nebulizer solution     . albuterol (VENTOLIN HFA) 108 (90 Base) MCG/ACT inhaler Inhale 2 puffs into the lungs every 6 (six) hours as needed. For shortness of breath 3 Inhaler 4  . ALPRAZolam (XANAX) 0.25 MG tablet Take 1 tablet (0.25 mg total) by mouth 2 (two) times daily as needed for anxiety. 180 tablet 1  . amLODipine (NORVASC) 10 MG tablet TAKE 1 TABLET BY MOUTH ONCE DAILY 90 tablet 1  . aspirin 81 MG tablet Take 81 mg by mouth daily.    [name] atorvastatin (LIPITOR) 20 MG tablet TAKE 1 TABLET BY MOUTH ONCE DAILY 90 tablet 1  . carvedilol (COREG) 25 MG tablet TAKE 1 TABLET BY MOUTH TWICE DAILY WITH MEALS 180 tablet 1  . chlorthalidone (HYGROTON) 25 MG tablet Take 1 tablet (25 mg total) by mouth daily. 90 tablet 1  . Cholecalciferol 50000 units capsule Take 1 capsule (50,000 Units total) by mouth once a week. 12 capsule 1  . cyanocobalamin 2000 MCG tablet Take 1 tablet (2,000 mcg total) by mouth daily. 90 tablet 1  . empagliflozin (JARDIANCE) 25 MG TABS tablet Take 25 mg by mouth daily. 90 tablet 3  . Ferrous Sulfate (IRON) 325 (65 Fe) MG TABS TAKE 1 TABLET BY MOUTH TWICE DAILY WITH A MEAL 180 tablet 1  . fluocinonide-emollient (LIDEX-E) 0.05 % cream Apply 1 application topically 2 (two) times daily. 120 g 1  . FREESTYLE TEST STRIPS test strip USE TO TEST BLOOD SUGAR UP TO 3 TIMES A DAY 100 each 11  . gabapentin (NEURONTIN) 300 MG capsule TAKE 1 CAPSULE BY MOUTH THREE TIMES DAILY 270 capsule 1  . Insulin Pen Needle 31G X 5 MM MISC Use once daily with insulin 100 each 3  . irbesartan (AVAPRO) 300 MG tablet TAKE 1 TABLET BY MOUTH ONCE DAILY    . latanoprost (XALATAN) 0.005 % ophthalmic solution Place 1 drop into both eyes at bedtime.    . lidocaine (XYLOCAINE) 2 % solution     . olmesartan (BENICAR) 40 MG tablet Take 1 tablet (40 mg total) by mouth daily. 90 tablet 1  . omeprazole (PRILOSEC) 40 MG capsule TAKE 1  CAPSULE BY MOUTH ONCE DAILY 90 capsule 1  . oxyCODONE-acetaminophen (PERCOCET) 7.5-325 MG tablet Take 1 tablet by mouth every 6 (six) hours as needed for severe pain. 90 tablet 0  . potassium chloride SA (K-DUR,KLOR-CON) 20 MEQ tablet Take 1 tablet (20 mEq total) by mouth 2 (two) times daily. 180 tablet 1  . SitaGLIPtin-MetFORMIN HCl (JANUMET XR) [phone] MG TB24 Take 1 tablet by mouth daily. 90 tablet 1  . TOUJEO SOLOSTAR 300 UNIT/ML SOPN INJECT 30 UNITS SUBCUTANEOUSLY ONCE DAILY 6 mL 5   No facility-administered medications prior to visit.     PAST MEDICAL HISTORY: Past Medical History:  Diagnosis Date  . Anemia   . Anxiety and depression   . Asthma   . Degenerative arthritis   . Depression   . Diabetes mellitus, type 2 ([place])   . Fatigue   . GERD (gastroesophageal reflux disease)   .  Glaucoma   . Hemorrhoids   . Hyperlipidemia   . Hypertension   . Hypothyroid   . Hypoxemia 11/24/2013  . Memory deficit 10/04/2013  . Obesity   . Sleep apnea   . Snoring disorder     PAST SURGICAL HISTORY: Past Surgical History:  Procedure Laterality Date  . benign tumors resected    . CATARACT EXTRACTION Left   . FOOT SURGERY Left    bone spur  . REFRACTIVE SURGERY     Glaucoma  . ROTATOR CUFF REPAIR    . TUBAL LIGATION      FAMILY HISTORY: Family History  Problem Relation Age of Onset  . Alcohol abuse Mother   . Heart attack Mother   . Coronary artery disease Brother   . Heart attack Father   . Heart disease Sister   . Atrial fibrillation Sister   . Hypertension Sister   . Hypertension Unknown        family history  . Alcohol abuse Unknown     SOCIAL HISTORY: Social History   Socioeconomic History  . Marital status: Married    Spouse name: Not on file  . Number of children: 3  . Years of education: 11th  . Highest education level: Not on file  Occupational History  . Occupation: disabled  Social Needs  . Financial resource strain: Not hard at all  . Food  insecurity:    Worry: Never true    Inability: Never true  . Transportation needs:    Medical: No    Non-medical: No  Tobacco Use  . Smoking status: Never Smoker  . Smokeless tobacco: Never Used  Substance and Sexual Activity  . Alcohol use: No    Alcohol/week: 0.0 standard drinks  . Drug use: No  . Sexual activity: Not Currently    Comment: not working, lives with husband, 3 sons  Lifestyle  . Physical activity:    Days per week: 0 days    Minutes per session: 0 min  . Stress: Very much  Relationships  . Social connections:    Talks on phone: Not on file    Gets together: Not on file    Attends religious service: Not on file    Active member of club or organization: Not on file    Attends meetings of clubs or organizations: Not on file    Relationship status: Married  . Intimate partner violence:    Fear of current or ex partner: Not on file    Emotionally abused: Not on file    Physically abused: Not on file    Forced sexual activity: Not on file  Other Topics Concern  . Not on file  Social History Narrative   Lives with spouse; spouse disabled and is on dialysis ; also caregiver for son who is 70 yo and is disabled.     PHYSICAL EXAM  Vitals:   11/14/18 1308  BP: 125/70  Pulse: 72  Weight: 250 lb 9.6 oz (113.7 kg)  Height: 5\' 6"  (1.676 m)   Body mass index is 40.45 kg/m.  Generalized: Well developed, morbidly obese female in no acute distress  Head: normocephalic and atraumatic,. Oropharynx benign [place] 3-4 Neck: Supple, circumference 17.5 Lungs clear Musculoskeletal: No deformity  Skin no rash or edema  Neurological examination   Mentation: Alert oriented to time, place, history taking. Attention span and concentration appropriate. Recent and remote memory intact.  Follows all commands speech and language fluent.   Cranial nerve II-XII: .Pupils  were equal round reactive to light extraocular movements were full, visual field were full on  confrontational test. Facial sensation and strength were normal. hearing was intact to finger rubbing bilaterally. Uvula tongue midline. head turning and shoulder shrug were normal and symmetric.Tongue protrusion into cheek strength was normal. Motor: normal bulk and tone, full strength in the BUE, BLE,  Sensory: normal and symmetric to light touch,   Coordination: finger-nose-finger, heel-to-shin bilaterally, no dysmetria Gait and Station: Rising up from seated position without assistance, normal stance,  moderate stride, good arm swing, smooth turning, able to perform tiptoe, and heel walking without difficulty. Tandem gait is steady.  No assistive device  DIAGNOSTIC DATA (LABS, IMAGING, TESTING) - I reviewed patient records, labs, notes, testing and imaging myself where available.  Lab Results  Component Value Date   WBC 11.4 (H) 08/11/2018   HGB 10.6 (L) 08/11/2018   HCT 32.6 (L) 08/11/2018   MCV 88.4 08/11/2018   PLT 380.0 08/11/2018      Component Value Date/Time   NA 142 08/11/2018 1441   K 3.7 08/11/2018 1441   CL 104 08/11/2018 1441   CO2 30 08/11/2018 1441   GLUCOSE 157 (H) 08/11/2018 1441   BUN 11 08/11/2018 1441   CREATININE 0.97 08/11/2018 1441   CALCIUM 9.4 08/11/2018 1441   PROT 7.8 08/11/2018 1441   ALBUMIN 4.2 08/11/2018 1441   AST 17 08/11/2018 1441   ALT 18 08/11/2018 1441   ALKPHOS 113 08/11/2018 1441   BILITOT 0.4 08/11/2018 1441   GFRNONAA >60 08/29/2015 0630   GFRAA >60 08/29/2015 0630   Lab Results  Component Value Date   CHOL 135 10/06/2017   HDL 46.30 10/06/2017   LDLCALC 43 03/28/2014   LDLDIRECT 55.0 10/06/2017   TRIG 261.0 (H) 10/06/2017   CHOLHDL 3 10/06/2017   Lab Results  Component Value Date   HGBA1C 7.2 (H) 06/20/2018    Lab Results  Component Value Date   TSH 2.33 06/20/2018      ASSESSMENT AND PLAN  72 y.o. year old female  has a past medical history of Obesity, Sleep apnea, and Snoring disorder. here to follow-up for her  sleep apnea.Compliance data dated 10/15/2018 to 11/13/2018 shows compliance greater than 4 hours at 100% pressure set 9 to 13 cm leak 95th percentile 4.6 AHI 0.2.  ESS 2    Patient is 100% compliant with her BiPAP  Continue same settings Follow-up yearly and as needed Dennie Bible, [facility], APRN  [facility] [address] [place], [place] [address] [phone]

## 2018-11-14 ENCOUNTER — Ambulatory Visit (INDEPENDENT_AMBULATORY_CARE_PROVIDER_SITE_OTHER): Payer: PPO | Admitting: Nurse Practitioner

## 2018-11-14 ENCOUNTER — Encounter: Payer: Self-pay | Admitting: Nurse Practitioner

## 2018-11-14 VITALS — BP 125/70 | HR 72 | Ht 66.0 in | Wt 250.6 lb

## 2018-11-14 DIAGNOSIS — G4733 Obstructive sleep apnea (adult) (pediatric): Secondary | ICD-10-CM | POA: Diagnosis not present

## 2018-11-14 NOTE — Patient Instructions (Signed)
Patient is 100% compliant with her BiPAP  Continue same settings Follow-up yearly and as needed

## 2018-11-22 ENCOUNTER — Ambulatory Visit: Payer: PPO | Admitting: Pharmacy Technician

## 2018-11-24 ENCOUNTER — Ambulatory Visit: Payer: Self-pay | Admitting: Pharmacy Technician

## 2018-11-24 ENCOUNTER — Other Ambulatory Visit: Payer: Self-pay | Admitting: Pharmacy Technician

## 2018-11-24 ENCOUNTER — Other Ambulatory Visit: Payer: Self-pay | Admitting: Internal Medicine

## 2018-11-24 DIAGNOSIS — K2100 Gastro-esophageal reflux disease with esophagitis, without bleeding: Secondary | ICD-10-CM

## 2018-11-24 DIAGNOSIS — E118 Type 2 diabetes mellitus with unspecified complications: Secondary | ICD-10-CM

## 2018-11-24 DIAGNOSIS — I10 Essential (primary) hypertension: Secondary | ICD-10-CM

## 2018-11-24 DIAGNOSIS — E876 Hypokalemia: Secondary | ICD-10-CM

## 2018-11-24 NOTE — Patient Outreach (Signed)
[place] [facility]) Care Management  11/24/2018  MARGORIE RENNER July 25, 1946 [id number]    Successful call placed to patient regarding patient assistance application, HIPAA identifiers verified. Mrs. Polanco was unable to confirm that patient assistance applications that were mailed to her were received. Patients husband has been in the hospital and she had family members checking the mail. Mrs. Cdebaca also stated that she wanted to wait to apply due to switching her insurance to the [employer] plan. I requested that she contact me if she runs into any issues meds that might not be covered under her new plan in 2020. She stated she would.  Will remove myself from Care Team since no longer proceeding with patient assistance at this time.   [name] Chana Bode [place] Certified Pharmacy Technician [place] Management Direct [phone]

## 2018-11-28 ENCOUNTER — Encounter: Payer: Self-pay | Admitting: *Deleted

## 2018-11-28 ENCOUNTER — Other Ambulatory Visit: Payer: Self-pay | Admitting: *Deleted

## 2018-11-28 NOTE — Patient Outreach (Signed)
[place] [facility]) Care Management  [place]  11/28/2018   CHONDRA BOYDE 1946/01/08 [id number]   [place] Quarterly Outreach   Referral Date:  04/15/2018 Referral Source:  Nurse Call Line Screening Reason for Referral:  Disease Management Education/Chronic Care Improvement Program Insurance:  [employer]   Outreach Attempt:  Successful telephone outreach to patient for quarterly follow up.  HIPAA verified with patient.  Patient reports she is feeling tired, caring for her ill husband.  States she has spoken with [place] concerning resources for caregiver burnout and depression, but feels she does not need any currently and will contact [facility] in the future if needed.  Reports she has not been taking her blood sugars regularly.  Yesterday's fasting blood sugar was 135.  Denies any hypo or hyperglycemic episodes.  Has worn Holter monitor.  States monitor stickers caused a rash that is clearing up, but leaving marks.  Encounter Medications:  Outpatient Encounter Medications as of 11/28/2018  Medication Sig Note  . albuterol (PROVENTIL) (2.5 MG/3ML) 0.083% nebulizer solution  04/16/2016: Received from: External Pharmacy  . albuterol (VENTOLIN HFA) 108 (90 Base) MCG/ACT inhaler Inhale 2 puffs into the lungs every 6 (six) hours as needed. For shortness of breath   . ALPRAZolam (XANAX) 0.25 MG tablet Take 1 tablet (0.25 mg total) by mouth 2 (two) times daily as needed for anxiety.   [name] amLODipine (NORVASC) 10 MG tablet TAKE 1 TABLET BY MOUTH ONCE DAILY   . aspirin 81 MG tablet Take 81 mg by mouth daily.   [name] atorvastatin (LIPITOR) 20 MG tablet TAKE 1 TABLET BY MOUTH ONCE DAILY   . carvedilol (COREG) 25 MG tablet TAKE 1 TABLET BY MOUTH TWICE DAILY WITH MEALS   . chlorthalidone (HYGROTON) 25 MG tablet Take 1 tablet (25 mg total) by mouth daily.   . Cholecalciferol 50000 units capsule Take 1 capsule (50,000 Units total) by mouth once a week.   .  cyanocobalamin 2000 MCG tablet Take 1 tablet (2,000 mcg total) by mouth daily.   . empagliflozin (JARDIANCE) 25 MG TABS tablet Take 25 mg by mouth daily.   . Ferrous Sulfate (IRON) 325 (65 Fe) MG TABS TAKE 1 TABLET BY MOUTH TWICE DAILY WITH A MEAL   . fluocinonide-emollient (LIDEX-E) 0.05 % cream Apply 1 application topically 2 (two) times daily.   [name] FREESTYLE TEST STRIPS test strip USE TO TEST BLOOD SUGAR UP TO 3 TIMES A DAY   . gabapentin (NEURONTIN) 300 MG capsule TAKE 1 CAPSULE BY MOUTH THREE TIMES DAILY   . Insulin Pen Needle 31G X 5 MM MISC Use once daily with insulin   . irbesartan (AVAPRO) 300 MG tablet TAKE 1 TABLET BY MOUTH ONCE DAILY   . latanoprost (XALATAN) 0.005 % ophthalmic solution Place 1 drop into both eyes at bedtime.   . lidocaine (XYLOCAINE) 2 % solution    . olmesartan (BENICAR) 40 MG tablet Take 1 tablet (40 mg total) by mouth daily.   [name] omeprazole (PRILOSEC) 40 MG capsule TAKE 1 CAPSULE BY MOUTH ONCE DAILY   . oxyCODONE-acetaminophen (PERCOCET) 7.5-325 MG tablet Take 1 tablet by mouth every 6 (six) hours as needed for severe pain.   . potassium chloride SA (K-DUR,KLOR-CON) 20 MEQ tablet TAKE 1 TABLET BY MOUTH TWICE DAILY   . SitaGLIPtin-MetFORMIN HCl (JANUMET XR) [phone] MG TB24 Take 1 tablet by mouth daily.   . TOUJEO SOLOSTAR 300 UNIT/ML SOPN INJECT 30 UNITS SUBCUTANEOUSLY ONCE DAILY  No facility-administered encounter medications on file as of 11/28/2018.     Functional Status:  In your present state of health, do you have any difficulty performing the following activities: 05/03/2018  Hearing? N  Vision? N  Difficulty concentrating or making decisions? Y  Comment trouble remembering things  Walking or climbing stairs? N  Dressing or bathing? N  Doing errands, shopping? N  Preparing Food and eating ? N  Using the Toilet? N  In the past six months, have you accidently leaked urine? N  Do you have problems with loss of bowel control? N  Managing your  Medications? N  Managing your Finances? N  Housekeeping or managing your Housekeeping? N  Some recent data might be hidden    Fall/Depression Screening: Fall Risk  10/04/2018 08/18/2018 07/12/2018  Falls in the past year? Yes No Yes  Comment no falls in the past few months no falls in the last month no falls in the last month  Number falls in past yr: 2 or more 2 or more 2 or more  Comment - - -  Injury with Fall? No No No  Risk Factor Category  High Fall Risk High Fall Risk High Fall Risk  Risk for fall due to : Impaired vision;History of fall(s);Impaired balance/gait;Medication side effect;Impaired mobility History of fall(s);Impaired balance/gait;Medication side effect;Impaired mobility Mental status change;History of fall(s);Impaired balance/gait  Follow up Falls prevention discussed;Education provided Falls prevention discussed;Falls evaluation completed;Education provided Falls prevention discussed;Education provided   [facility] 2/9 Scores 05/03/2018 04/20/2018 04/15/2018 12/15/2017 10/28/2017 06/02/2017 04/29/2016  PHQ - 2 Score 0 0 '2 4 2 ' - 2  PHQ- 9 Score - - '12 9 11 ' - -  Exception Documentation - - - - - Patient refusal -   [facility] CM Care Plan Problem One     Most Recent Value  Care Plan Problem One  Knowledge defiecit related to self care management of diabetes and stress reduction  Role Documenting the Problem One  [place] for Problem One  Active  [facility] Long Term Goal   Patient will maintain Hgb A1C below 7.5 in the next 90 days.  [facility] Long Term Goal Start Date  11/28/18  Interventions for Problem One Long Term Goal  Current care plan and goals reviewed and discussed with patient, reviewed medications and indications and encouraged medication compliance, discussed importance of monitoring blood sugars and encouraged patient to get back to her routine of checking her blood sugars at least dail, encouraged patient to reduce stress and seek assistance from other family members in caring  for husband, encouraged to seek assistance of [facility] social Worker for resources for depression of caregiver burnout in the future  [facility] CM Short Term Goal #1   Patient will schedule follow up appointment with primary care provider within the month of January 2020.  [facility] CM Short Term Goal #1 Start Date  11/28/18  [facility] CM Short Term Goal #1 Met Date  11/28/18  Interventions for Short Term Goal #1  Reviewed and discussed last note with primary care provider and discussed need for follow up in 4 months, encouraged patient to contact office to arrange 4 month follow up in the month of January, discussedimportance of medical follow up  [facility] CM Short Term Goal #2   Patient will report no emergency room visits or urgent care visits in the next 60 days.  [facility] CM Short Term Goal #2 Start Date  10/04/18  [facility] CM Short Term Goal #2 Met  Date  11/28/18      Appointments:  Patient last attended appointment with primary care provider on 08/18/2018 and needs to schedule 4 month follow up appointment.  Encouraged to schedule follow up appointment.  Plan: RN Health Coach will send patient 2020 Calendar Booklet. RN Health Coach will send primary care provider quarterly update. RN Health Coach will make next telephone outreach to patient within the month of March.  [place] Coach [phone] Manreet [email or web address]

## 2018-11-29 DIAGNOSIS — G4733 Obstructive sleep apnea (adult) (pediatric): Secondary | ICD-10-CM | POA: Diagnosis not present

## 2018-12-21 ENCOUNTER — Ambulatory Visit: Payer: PPO | Admitting: Cardiovascular Disease

## 2019-01-04 ENCOUNTER — Encounter: Payer: Self-pay | Admitting: Internal Medicine

## 2019-01-04 ENCOUNTER — Other Ambulatory Visit (INDEPENDENT_AMBULATORY_CARE_PROVIDER_SITE_OTHER): Payer: HMO

## 2019-01-04 ENCOUNTER — Ambulatory Visit (INDEPENDENT_AMBULATORY_CARE_PROVIDER_SITE_OTHER): Payer: HMO | Admitting: Internal Medicine

## 2019-01-04 VITALS — BP 118/70 | HR 81 | Temp 97.6°F | Ht 66.0 in | Wt 251.0 lb

## 2019-01-04 DIAGNOSIS — E118 Type 2 diabetes mellitus with unspecified complications: Secondary | ICD-10-CM | POA: Diagnosis not present

## 2019-01-04 DIAGNOSIS — D510 Vitamin B12 deficiency anemia due to intrinsic factor deficiency: Secondary | ICD-10-CM | POA: Diagnosis not present

## 2019-01-04 DIAGNOSIS — D539 Nutritional anemia, unspecified: Secondary | ICD-10-CM | POA: Diagnosis not present

## 2019-01-04 DIAGNOSIS — M48061 Spinal stenosis, lumbar region without neurogenic claudication: Secondary | ICD-10-CM | POA: Diagnosis not present

## 2019-01-04 DIAGNOSIS — I10 Essential (primary) hypertension: Secondary | ICD-10-CM

## 2019-01-04 DIAGNOSIS — E785 Hyperlipidemia, unspecified: Secondary | ICD-10-CM | POA: Diagnosis not present

## 2019-01-04 DIAGNOSIS — Z6841 Body Mass Index (BMI) 40.0 and over, adult: Secondary | ICD-10-CM | POA: Diagnosis not present

## 2019-01-04 DIAGNOSIS — E039 Hypothyroidism, unspecified: Secondary | ICD-10-CM

## 2019-01-04 LAB — CBC WITH DIFFERENTIAL/PLATELET
Basophils Absolute: 0.1 10*3/uL (ref 0.0–0.1)
Basophils Relative: 0.6 % (ref 0.0–3.0)
Eosinophils Absolute: 0.2 10*3/uL (ref 0.0–0.7)
Eosinophils Relative: 2.2 % (ref 0.0–5.0)
HCT: 34.1 % — ABNORMAL LOW (ref 36.0–46.0)
Hemoglobin: 11.4 g/dL — ABNORMAL LOW (ref 12.0–15.0)
Lymphocytes Relative: 28.4 % (ref 12.0–46.0)
Lymphs Abs: 3 10*3/uL (ref 0.7–4.0)
MCHC: 33.5 g/dL (ref 30.0–36.0)
MCV: 86.9 fl (ref 78.0–100.0)
Monocytes Absolute: 0.9 10*3/uL (ref 0.1–1.0)
Monocytes Relative: 8.9 % (ref 3.0–12.0)
Neutro Abs: 6.3 10*3/uL (ref 1.4–7.7)
Neutrophils Relative %: 59.9 % (ref 43.0–77.0)
Platelets: 381 10*3/uL (ref 150.0–400.0)
RBC: 3.93 Mil/uL (ref 3.87–5.11)
RDW: 15.8 % — ABNORMAL HIGH (ref 11.5–15.5)
WBC: 10.5 10*3/uL (ref 4.0–10.5)

## 2019-01-04 LAB — URINALYSIS, ROUTINE W REFLEX MICROSCOPIC
Bilirubin Urine: NEGATIVE
Hgb urine dipstick: NEGATIVE
Ketones, ur: NEGATIVE
Leukocytes, UA: NEGATIVE
Nitrite: NEGATIVE
RBC / HPF: NONE SEEN (ref 0–?)
Specific Gravity, Urine: <=1.005 — AB (ref 1.000–1.030)
Total Protein, Urine: NEGATIVE
Urine Glucose: >=1000 — AB
Urobilinogen, UA: 0.2 (ref 0.0–1.0)
WBC, UA: NONE SEEN (ref 0–?)
pH: 6 (ref 5.0–8.0)

## 2019-01-04 LAB — POCT GLYCOSYLATED HEMOGLOBIN (HGB A1C): Hemoglobin A1C: 6.3 % — AB (ref 4.0–5.6)

## 2019-01-04 LAB — COMPREHENSIVE METABOLIC PANEL
ALT: 17 U/L (ref 0–35)
AST: 17 U/L (ref 0–37)
Albumin: 4.3 g/dL (ref 3.5–5.2)
Alkaline Phosphatase: 93 U/L (ref 39–117)
BUN: 18 mg/dL (ref 6–23)
CO2: 29 mEq/L (ref 19–32)
Calcium: 10.1 mg/dL (ref 8.4–10.5)
Chloride: 98 mEq/L (ref 96–112)
Creatinine, Ser: 1.04 mg/dL (ref 0.40–1.20)
GFR: 63.24 mL/min (ref >60.00)
Glucose, Bld: 96 mg/dL (ref 70–99)
Potassium: 4.2 mEq/L (ref 3.5–5.1)
Sodium: 137 mEq/L (ref 135–145)
Total Bilirubin: 0.5 mg/dL (ref 0.2–1.2)
Total Protein: 7.8 g/dL (ref 6.0–8.3)

## 2019-01-04 LAB — IBC PANEL
Iron: 79 ug/dL (ref 42–145)
Saturation Ratios: 21 % (ref 20.0–50.0)
Transferrin: 269 mg/dL (ref 212.0–360.0)

## 2019-01-04 LAB — LDL CHOLESTEROL, DIRECT: Direct LDL: 57 mg/dL

## 2019-01-04 LAB — LIPID PANEL
Cholesterol: 140 mg/dL (ref 0–200)
HDL: 45.2 mg/dL (ref >39.00)
NonHDL: 95
Total CHOL/HDL Ratio: 3
Triglycerides: 236 mg/dL — ABNORMAL HIGH (ref 0.0–149.0)
VLDL: 47.2 mg/dL — ABNORMAL HIGH (ref 0.0–40.0)

## 2019-01-04 LAB — TSH: TSH: 2.98 u[IU]/mL (ref 0.35–4.50)

## 2019-01-04 LAB — FOLATE: Folate: 12.8 ng/mL (ref >5.9)

## 2019-01-04 LAB — FERRITIN: Ferritin: 67.3 ng/mL (ref 10.0–291.0)

## 2019-01-04 MED ORDER — INSULIN GLARGINE (1 UNIT DIAL) 300 UNIT/ML ~~LOC~~ SOPN: 30.0000 [IU] | PEN_INJECTOR | Freq: Every day | SUBCUTANEOUS | 3 refills | Status: DC

## 2019-01-04 MED ORDER — OXYCODONE-ACETAMINOPHEN 7.5-325 MG PO TABS: 1.0000 | ORAL_TABLET | Freq: Four times a day (QID) | ORAL | 0 refills | Status: DC | PRN

## 2019-01-04 NOTE — Progress Notes (Signed)
Subjective:  Patient ID: Valerie [name], female    DOB: 12-02-1946  Age: 73 y.o. MRN: [id number]  CC: Hypertension; Hyperlipidemia; Diabetes; and Anemia   HPI Valerie [name] presents for f/up - She complains of chronic, nonradiating low back pain.  She gets symptom relief with the occasional dose of Percocet and she requests a refill.  She tells me her blood sugars have been well controlled and she denies polys.  She has a persistent, chronic complaint of fatigue and weakness.  Outpatient Medications Prior to Visit  Medication Sig Dispense Refill  . albuterol (PROVENTIL) (2.5 MG/3ML) 0.083% nebulizer solution     . albuterol (VENTOLIN HFA) 108 (90 Base) MCG/ACT inhaler Inhale 2 puffs into the lungs every 6 (six) hours as needed. For shortness of breath 3 Inhaler 4  . ALPRAZolam (XANAX) 0.25 MG tablet Take 1 tablet (0.25 mg total) by mouth 2 (two) times daily as needed for anxiety. 180 tablet 1  . aspirin 81 MG tablet Take 81 mg by mouth daily.    [name] atorvastatin (LIPITOR) 20 MG tablet TAKE 1 TABLET BY MOUTH ONCE DAILY 90 tablet 1  . carvedilol (COREG) 25 MG tablet TAKE 1 TABLET BY MOUTH TWICE DAILY WITH MEALS 180 tablet 1  . chlorthalidone (HYGROTON) 25 MG tablet Take 1 tablet (25 mg total) by mouth daily. 90 tablet 1  . Cholecalciferol 50000 units capsule Take 1 capsule (50,000 Units total) by mouth once a week. 12 capsule 1  . cyanocobalamin 2000 MCG tablet Take 1 tablet (2,000 mcg total) by mouth daily. 90 tablet 1  . empagliflozin (JARDIANCE) 25 MG TABS tablet Take 25 mg by mouth daily. 90 tablet 3  . Ferrous Sulfate (IRON) 325 (65 Fe) MG TABS TAKE 1 TABLET BY MOUTH TWICE DAILY WITH A MEAL 180 tablet 1  . fluocinonide-emollient (LIDEX-E) 0.05 % cream Apply 1 application topically 2 (two) times daily. 120 g 1  . FREESTYLE TEST STRIPS test strip USE TO TEST BLOOD SUGAR UP TO 3 TIMES A DAY 100 each 11  . gabapentin (NEURONTIN) 300 MG capsule TAKE 1 CAPSULE BY MOUTH THREE TIMES DAILY 270  capsule 1  . Insulin Pen Needle 31G X 5 MM MISC Use once daily with insulin 100 each 3  . irbesartan (AVAPRO) 300 MG tablet TAKE 1 TABLET BY MOUTH ONCE DAILY    . latanoprost (XALATAN) 0.005 % ophthalmic solution Place 1 drop into both eyes at bedtime.    . lidocaine (XYLOCAINE) 2 % solution     . omeprazole (PRILOSEC) 40 MG capsule TAKE 1 CAPSULE BY MOUTH ONCE DAILY 90 capsule 1  . potassium chloride SA (K-DUR,KLOR-CON) 20 MEQ tablet TAKE 1 TABLET BY MOUTH TWICE DAILY 180 tablet 1  . SitaGLIPtin-MetFORMIN HCl (JANUMET XR) [phone] MG TB24 Take 1 tablet by mouth daily. 90 tablet 1  . amLODipine (NORVASC) 10 MG tablet TAKE 1 TABLET BY MOUTH ONCE DAILY 90 tablet 1  . olmesartan (BENICAR) 40 MG tablet Take 1 tablet (40 mg total) by mouth daily. 90 tablet 1  . oxyCODONE-acetaminophen (PERCOCET) 7.5-325 MG tablet Take 1 tablet by mouth every 6 (six) hours as needed for severe pain. 90 tablet 0  . TOUJEO SOLOSTAR 300 UNIT/ML SOPN INJECT 30 UNITS SUBCUTANEOUSLY ONCE DAILY 6 mL 5   No facility-administered medications prior to visit.     ROS Review of Systems  Constitutional: Positive for fatigue. Negative for appetite change, diaphoresis, fever and unexpected weight change.  HENT: Negative.   Eyes: Negative  for visual disturbance.  Respiratory: Negative.  Negative for cough, chest tightness, shortness of breath and wheezing.   Cardiovascular: Negative for chest pain, palpitations and leg swelling.  Gastrointestinal: Negative for abdominal pain, blood in stool, constipation, diarrhea, nausea and vomiting.  Endocrine: Negative for polydipsia, polyphagia and polyuria.  Genitourinary: Negative for decreased urine volume, difficulty urinating, dysuria, hematuria and urgency.  Musculoskeletal: Positive for back pain. Negative for myalgias and neck pain.  Skin: Negative.  Negative for color change and pallor.  Neurological: Positive for weakness ("I feel weak all over"). Negative for dizziness,  light-headedness, numbness and headaches.  Hematological: Negative.  Negative for adenopathy. Does not bruise/bleed easily.  Psychiatric/Behavioral: Negative for dysphoric mood, self-injury, sleep disturbance and suicidal ideas. The patient is nervous/anxious.     Objective:  BP 118/70 (BP Location: Left Arm, Patient Position: Sitting, Cuff Size: Large)   Pulse 81   Temp 97.6 F (36.4 C) (Oral)   Ht 5\' 6"  (1.676 m)   Wt 251 lb (113.9 kg)   SpO2 97%   BMI 40.51 kg/m   BP Readings from Last 3 Encounters:  01/04/19 118/70  11/14/18 125/70  09/21/18 136/78    Wt Readings from Last 3 Encounters:  01/04/19 251 lb (113.9 kg)  11/14/18 250 lb 9.6 oz (113.7 kg)  10/06/18 252 lb (114.3 kg)    Physical Exam Constitutional:      Appearance: She is obese. She is not ill-appearing or diaphoretic.  HENT:     Nose: Nose normal. No congestion or rhinorrhea.     Mouth/Throat:     Mouth: Mucous membranes are moist.     Pharynx: Oropharynx is clear. No oropharyngeal exudate or posterior oropharyngeal erythema.  Eyes:     General: No scleral icterus.    Conjunctiva/sclera: Conjunctivae normal.     Pupils: Pupils are equal, round, and reactive to light.  Neck:     Musculoskeletal: Normal range of motion and neck supple. No neck rigidity or muscular tenderness.     Vascular: No carotid bruit.  Cardiovascular:     Rate and Rhythm: Normal rate and regular rhythm.     Pulses: Normal pulses.     Heart sounds: No murmur. No gallop.   Pulmonary:     Effort: Pulmonary effort is normal.     Breath sounds: No stridor. No wheezing, rhonchi or rales.  Abdominal:     General: Bowel sounds are normal.     Palpations: There is no hepatomegaly, splenomegaly or mass.     Tenderness: There is no abdominal tenderness. There is no guarding.  Musculoskeletal: Normal range of motion.        General: No swelling.     Right lower leg: No edema.     Left lower leg: No edema.  Lymphadenopathy:      Cervical: No cervical adenopathy.  Skin:    General: Skin is warm.  Neurological:     General: No focal deficit present.     Mental Status: She is oriented to person, place, and time. Mental status is at baseline.     Motor: Motor function is intact. No weakness or tremor.     Coordination: Coordination is intact. Coordination normal.     Deep Tendon Reflexes: Reflexes normal.     Comments: Neg SLR in BLE  Psychiatric:        Mood and Affect: Mood normal.        Behavior: Behavior normal.        Thought Content:  Thought content normal.        Judgment: Judgment normal.     Lab Results  Component Value Date   WBC 10.5 01/04/2019   HGB 11.4 (L) 01/04/2019   HCT 34.1 (L) 01/04/2019   PLT 381.0 01/04/2019   GLUCOSE 96 01/04/2019   CHOL 140 01/04/2019   TRIG 236.0 (H) 01/04/2019   HDL 45.20 01/04/2019   LDLDIRECT 57.0 01/04/2019   LDLCALC 43 03/28/2014   ALT 17 01/04/2019   AST 17 01/04/2019   NA 137 01/04/2019   K 4.2 01/04/2019   CL 98 01/04/2019   CREATININE 1.04 01/04/2019   BUN 18 01/04/2019   CO2 29 01/04/2019   TSH 2.98 01/04/2019   INR 1.06 08/29/2015   HGBA1C 6.3 (A) 01/04/2019   MICROALBUR <0.7 06/20/2018    No results found.  Assessment & Plan:   Valerie [name] was seen today for hypertension, hyperlipidemia, diabetes and anemia.  Diagnoses and all orders for this visit:  Essential hypertension, benign- Her blood pressure is adequately well controlled.  Electrolytes and renal function are normal. -     Urinalysis, Routine w reflex microscopic; Future -     Comprehensive metabolic panel; Future  Hypothyroidism, unspecified type- Her TSH is in the normal range.  Thyroid replacement therapy is not indicated. -     TSH; Future  Type II diabetes mellitus with manifestations ([place])- Her A1c is at 6.3%.  Her blood sugars are adequately well controlled. -     Comprehensive metabolic panel; Future -     POCT glycosylated hemoglobin (Hb A1C) -     Insulin Glargine, 1  Unit Dial, (TOUJEO SOLOSTAR) 300 UNIT/ML SOPN; Inject 30 Units into the skin daily.  Vitamin B12 deficiency anemia due to intrinsic factor deficiency- Her anemia has improved and her B12 level is in the normal range.  Will continue the high-dose oral B12 replacement. -     CBC with Differential/Platelet; Future -     Vitamin B12; Future -     Folate; Future  Morbid obesity with BMI of 45.0-49.9, adult ([place])- She is working on her lifestyle modifications to lose weight.  Hyperlipidemia with target LDL less than 100- She has achieved her LDL goal and is doing well on the statin. -     Lipid panel; Future  Deficiency anemia- Her H&H have improved but she still mildly anemic.  I will screen her for vitamin deficiencies.  She also has a component of the anemia of chronic disease. -     IBC panel; Future -     Ferritin; Future -     Vitamin B1; Future -     Reticulocytes; Future  Spinal stenosis of lumbar 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 -     oxyCODONE-acetaminophen (PERCOCET) 7.5-325 MG tablet; Take 1 tablet by mouth every 6 (six) hours as needed for severe pain.   I have discontinued Pamala Hurry A. Sweatman's amLODipine and olmesartan. I have also changed her TOUJEO SOLOSTAR to Insulin Glargine (1 Unit Dial). Additionally, I am having her maintain her latanoprost, aspirin, Insulin Pen Needle, albuterol, FREESTYLE TEST STRIPS, SitaGLIPtin-MetFORMIN HCl, albuterol, irbesartan, lidocaine, gabapentin, Cholecalciferol, cyanocobalamin, ALPRAZolam, fluocinonide-emollient, chlorthalidone, Iron, empagliflozin, carvedilol, potassium chloride SA, atorvastatin, omeprazole, and oxyCODONE-acetaminophen.  Meds ordered this encounter  Medications  . oxyCODONE-acetaminophen (PERCOCET) 7.5-325 MG tablet    Sig: Take 1 tablet by mouth every 6 (six) hours as needed for severe pain.    Dispense:  90 tablet    Refill:  0  . Insulin  Glargine, 1 Unit Dial, (TOUJEO SOLOSTAR) 300 UNIT/ML SOPN    Sig: Inject 30 Units into the  skin daily.    Dispense:  9 mL    Refill:  3    Please consider 90 day supplies to promote better adherence     Follow-up: Return in about 4 months (around 05/05/2019).  [name], MD

## 2019-01-04 NOTE — Patient Instructions (Signed)

## 2019-01-05 LAB — VITAMIN B12: Vitamin B-12: 485 pg/mL (ref 211–911)

## 2019-01-06 MED ORDER — INSULIN PEN NEEDLE 31G X 5 MM MISC: 3 refills | Status: DC

## 2019-01-06 NOTE — Addendum Note (Signed)
Addended by: Aviva Signs M on: 01/06/2019 12:29 PM   Modules accepted: Orders

## 2019-01-07 LAB — RETICULOCYTES
ABS Retic: 68000 cells/uL (ref 20000–8000)
Retic Ct Pct: 1.7 %

## 2019-01-07 LAB — VITAMIN B1: Vitamin B1 (Thiamine): 7 nmol/L — ABNORMAL LOW (ref 8–30)

## 2019-01-09 ENCOUNTER — Encounter: Payer: Self-pay | Admitting: Internal Medicine

## 2019-01-09 ENCOUNTER — Other Ambulatory Visit: Payer: Self-pay | Admitting: Internal Medicine

## 2019-01-09 DIAGNOSIS — E519 Thiamine deficiency, unspecified: Secondary | ICD-10-CM | POA: Insufficient documentation

## 2019-01-09 MED ORDER — VITAMIN B-1 100 MG PO TABS: 100.0000 mg | ORAL_TABLET | Freq: Every day | ORAL | 0 refills | Status: DC

## 2019-01-11 ENCOUNTER — Ambulatory Visit: Payer: PPO | Admitting: Cardiovascular Disease

## 2019-01-31 ENCOUNTER — Other Ambulatory Visit: Payer: Self-pay

## 2019-01-31 DIAGNOSIS — E519 Thiamine deficiency, unspecified: Secondary | ICD-10-CM

## 2019-01-31 DIAGNOSIS — I10 Essential (primary) hypertension: Secondary | ICD-10-CM

## 2019-01-31 DIAGNOSIS — E118 Type 2 diabetes mellitus with unspecified complications: Secondary | ICD-10-CM

## 2019-01-31 MED ORDER — VITAMIN B-1 100 MG PO TABS: 100.0000 mg | ORAL_TABLET | Freq: Every day | ORAL | 1 refills | Status: DC

## 2019-01-31 MED ORDER — CHLORTHALIDONE 25 MG PO TABS: 25.0000 mg | ORAL_TABLET | Freq: Every day | ORAL | 1 refills | Status: DC

## 2019-01-31 MED ORDER — SITAGLIP PHOS-METFORMIN HCL ER 100-1000 MG PO TB24: 1.0000 | ORAL_TABLET | Freq: Every day | ORAL | 1 refills | Status: DC

## 2019-01-31 MED ORDER — EMPAGLIFLOZIN 25 MG PO TABS: 25.0000 mg | ORAL_TABLET | Freq: Every day | ORAL | 1 refills | Status: DC

## 2019-02-20 ENCOUNTER — Other Ambulatory Visit: Payer: Self-pay

## 2019-02-20 NOTE — Patient Outreach (Signed)
  [place] [facility]) Care Management Chronic Special Needs Program  02/20/2019  Name: Valerie [name] DOB: [birth year]  MRN: [id number]  Valerie [name] is enrolled in a Chronic Special Needs Plan. RNCM called to review Health Risk Assessment and to complete individualized Care plan. No answer. Unable to leave message.    Plan: Chronic care management coordinator will attempt outreach in 2-3 days.  [name], RN, MSN, [place] [phone]

## 2019-02-21 ENCOUNTER — Other Ambulatory Visit: Payer: Self-pay

## 2019-02-21 NOTE — Patient Outreach (Signed)
  [place] [facility]) Care Management Chronic Special Needs Program  02/21/2019  Name: Valerie [name] DOB: [birth year]  MRN: [id number]  Ms. Genecis Veley is enrolled in a Chronic Special Needs Plan. RNCM called to review Health Risk assessment and complete individualized care plan. No answer. HIPPA compliant message left.   Plan: Chronic care management coordinator will attempt outreach in 5 business days.   [name], RN, MSN, [place] [phone]

## 2019-02-23 ENCOUNTER — Other Ambulatory Visit: Payer: Self-pay | Admitting: *Deleted

## 2019-02-23 ENCOUNTER — Other Ambulatory Visit: Payer: Self-pay

## 2019-02-23 NOTE — Patient Outreach (Signed)
[place] [facility]) Care Management  02/23/2019  Valerie [name] 1946/04/21 [id number]   RN Health Coach Transfer to CSNP  Referral Date:04/15/2018 Referral Source:Nurse Call Line Screening Reason for Referral:Disease Management Education/Chronic Care Improvement Program Insurance:Health Team Advantage CSNP   Outreach Attempt:  Outreach attempt #1 to patient for follow up and transition to HTA CSNP program.  No answer. RN Health Coach left HIPAA compliant voicemail message along with contact information.  Plan:  RN Health Coach will make another outreach attempt within the month of March to make transfer to [place] Management Team.  Hubert Azure RN [place] [phone] [email or web address]

## 2019-02-23 NOTE — Patient Outreach (Signed)
  [place] [facility]) Care Management Chronic Special Needs Program  02/23/2019  Name: Valerie [name] DOB: September 10, 1946  MRN: [id number]  [name] is enrolled in a Chronic Special Needs Plan. RNCM called to review Health Risk assessment and complete individualized care plan. No answer. Unable to leave message. 3rd attempt.  RNCM will completed individualized care plan based on completed Health Risk assessment. Per Health Risk assessment, client sometimes goes without medications due to cost. Client request advanced directive information; overwhelmed/stress.  Plan: Refer to pharmacy for medication review and possible medication assistance, social work referral regarding overwhelmed/stress, send education material, send advanced directive packet. follow up outreach within 3 months.   Valerie Silversmith, RN, MSN, [name] [phone]

## 2019-02-24 ENCOUNTER — Ambulatory Visit: Payer: HMO

## 2019-02-24 ENCOUNTER — Other Ambulatory Visit: Payer: Self-pay | Admitting: *Deleted

## 2019-02-24 ENCOUNTER — Other Ambulatory Visit: Payer: Self-pay

## 2019-02-24 NOTE — Patient Outreach (Signed)
[facility] [facility]) Care Management  02/24/2019  ROSIELEE CORPORAN [birth year] [id number]   RN Health Coach Transfer to CSNP  Referral Date:04/15/2018 Referral Source:Nurse Call Line Screening Reason for Referral:Disease Management Education/Chronic Care Improvement Program Insurance:Health Team Advantage CSNP   Outreach Attempt:  Successful telephone outreach to patient to inform her of her transition to [place] Management Coordinator [name].  HIPAA verified with patient.  RN Health Coach verified patient updated her insurance to [place] and discussed with patient she will have a new specialized Care Nurse, children's.  Discussed with patient [place] has been trying to reach her and gave patient Juana's contact information and requested she give her a call.  RN Health Coach thanked patient for her participation with the Disease Management Program.  Patient appreciative of phone call and update.  Stated she would give [place] a call on Monday.  Plan: RN Health Coach will send primary care provider Discipline Closure Letter. RN Health Coach will update HTA CSNP Care Management Coordinator Denton Brick of patient being updated of transition. RN Health Coach will close Disease Management case.  [place] [phone] [email or web address]

## 2019-02-28 ENCOUNTER — Telehealth: Payer: Self-pay | Admitting: Pharmacist

## 2019-02-28 NOTE — Patient Outreach (Signed)
[place] [facility]) Care Management  02/28/2019  Valerie [name] February 22, 1946 [id number]   Patient was called for medication review since she is part of the CSNP program. Patient answer the phone but said she was on the phone with her son's provider's office and was trying to change his appointment. She asked that I call her back.  Plan: Mail patient an unsuccessful outreach letter Call her back in 7-10 days based on current back log of referrals and low severity of referral.  Elayne Guerin, PharmD, [place] Clinical Pharmacist [phone]

## 2019-03-01 ENCOUNTER — Ambulatory Visit: Payer: HMO | Admitting: Cardiovascular Disease

## 2019-03-01 ENCOUNTER — Other Ambulatory Visit: Payer: Self-pay

## 2019-03-01 NOTE — Patient Outreach (Addendum)
  [place] [facility]) Care Management Chronic Special Needs Program   03/01/2019  Name: Valerie [name], DOB: 1946/08/08  MRN: [id number]  The client was discussed in today's interdisciplinary care team meeting.  The following issues were discussed:  Client's needs, Key risk triggers/risk stratification, Care Plan, Coordination of care and Issues/barriers to care  Participants present:  Mahlon Gammon, RNCM; Peter Garter, RNCM; [name], RNCM; Dr. Maryella Shivers.  Recommendations/Plan: Continue to outreach to client. Follow up with referred disciplines. Engage previous health coach in attempts to reach for warm transfer if needed.  [name], RN, MSN, [place] [phone]

## 2019-03-14 ENCOUNTER — Other Ambulatory Visit: Payer: Self-pay | Admitting: Pharmacist

## 2019-03-14 ENCOUNTER — Telehealth: Payer: Self-pay | Admitting: *Deleted

## 2019-03-14 ENCOUNTER — Other Ambulatory Visit: Payer: Self-pay | Admitting: Pharmacy Technician

## 2019-03-14 ENCOUNTER — Other Ambulatory Visit: Payer: Self-pay

## 2019-03-14 NOTE — Patient Outreach (Deleted)
[facility]) Care Management  03/14/2019  Valerie [name] Apr 23, 1946 [id number]                            Medication Assistance Referral  Referral From: [place]  Medication/Company: [name] / BI Patient application portion:  Mailed Provider application portion: Faxed  to [name]  Medication/Company: Proventil / [employer] Patient application portion:  Mailed Provider application portion:  N/A *** to Already had on file from December 2019  Medication/Company: [name] / [employer] Patient application portion:  Mailed Provider application portion:  N/A *** to Already had on file from December 2019    Follow up:  Will follow up with patient in 5-10 business days to confirm application(s) have been received.  [name] P. Evamaria Detore, [place] Management [phone]

## 2019-03-14 NOTE — Patient Outreach (Addendum)
[facility]) Care Management Chronic Special Needs Program  03/14/2019  Name: Valerie [name] DOB: 1946/07/29  MRN: [id number]  [name] is enrolled in a chronic special needs plan for Diabetes. Chronic Care Management Coordinator telephoned client to review health risk assessment and to develop individualized care plan.  Introduced the chronic care management program, importance of client participation, and taking their care plan to all provider appointments and inpatient facilities.  Reviewed the transition of care process and possible referral to community care management.  Subjective: I have been going through so much with  My husband. Cleint reports feeling more overwhelmed today. She states, more anxious today-"I just feel overwhelmed". She reports xanax as needed and states she has just taken one and is going to lie down and rest following this call.  Goals Addressed            This Visit's Progress   .  Acknowledge receipt of Advanced Directive package   On track   .  Diabetes Patient stated goal-"Loose weight" (pt-stated)   On track    Continue previous goal to increase activity to approximately 150 minutes per week/ 20 minutes of activity per day.  At this point increase activity in and around your home due to the "stay at home order" and social distancing-keeping at least 6 feet away from other persons if you have to be out; frequent handwashing and hand sanitizer use.    . Advanced Care Planning complete as directed by client within the next 6-9 months.   On track   . COMPLETED: Attending meditation / other stress management classes   On track    [facility]; will try the pool  Active Prayer life; States she can work on this/ Will go to prayer service at [employer] to sister Would like to get back into walking/ may try pool   Apt with Dr. Ronnald Ramp to discuss more affordable meds for depression if she continues to feel bad or mood  gets more depressed Is functioning at present  Wellness nurse to put together some resources for disabled son: Will Speak with   Goal from 2016 - modified- client reports goals achieved but would like to continue exercise and prayer life.    . Client understands the importance of follow-up with providers by attending scheduled visits   On track   . Client will report improved coping within the next 6-9 months.   No change    Continue goals for increased activity. Continue prayer life.  Education Handout-Coping with a Health condition: what you can do.    . Client will use Assistive Devices as needed and verbalize understanding of device use   On track   . Client will verbalize knowledge of self management of Hypertension as evidences by BP reading of 140/90 or less; or as defined by provider   On track   . Exercise 150 minutes per week (moderate activity)   On track    Will check Healthte[employer] advantage and start back exercising if she can;  Continue to work on this goal within the area of her home, reminder of social distancing.     . Maintain timely refills of diabetic medication as prescribed within the year .   On track   . Obtain annual  Lipid Profile, LDL-C   On track   . Obtain Annual Eye (retinal)  Exam    On track   . Obtain Annual Foot  Exam   On track   . Obtain annual screen for micro albuminuria (urine) , nephropathy (kidney problems)   On track   . Obtain Hemoglobin A1C at least 2 times per year   On track   . COMPLETED: reducing stress        1. Tell her sister now 2. Tell grand dtr no as well 3. Will work out time for Valerie [name]!!    . Visit Primary Care Provider or Endocrinologist at least 2 times per year    On track     Coronavirus information discussed.  Plan: send updated care plan to client; Send educational material Chronic care management coordinator will outreach in:  3 Months. Care Coordination with social work.    Valerie Silversmith, RN, MSN, [place] [phone]

## 2019-03-14 NOTE — Patient Outreach (Signed)
[place] [facility]) Care Management  03/14/2019  Valerie [name] [birth year] [id number]  ADDENDUM  Duplication due to computer glitching out.  Received medication assistance referral from [place] for Janumet and Proventil HFA with [employer] patient assistance and Jardiance with BI patient assistance.  Mailed patient the patient's portion of the application.  Already had the [employer] provider portion on hand from December 2019. Faxed BI portion to [name].  Will followup with patient in 5-10 business days to inquire on status of application.  [name] P. Abram Sax, [place] Management [phone]

## 2019-03-14 NOTE — Patient Outreach (Signed)
[facility] [place] [facility]) Care Management  [place]   03/14/2019  Valerie [name] Aug 27, 1946 [id number]  Reason for referral: Medication Assistance, Medication Review  Referral source: [facility] RN Current insurance: Health Team Advantage C-SNP  PMHx includes but not limited to:  Type 2 diabetes, hypertension, iron deficiency anemia, B-12 deficiency, GERD, hypothyroidism, Insomnia, morbid obesity, osteopenia, type 2 diabetes, and hyperlipidemia.  Outreach:  Successful telephone call with patient.  HIPAA identifiers verified.   Subjective:  Patient reports using a pill box as an adherence strategy.  Does the patient ever forget to take medication?  no Does the patient have problems obtaining medications due to transportation?   no Does the patient have problems obtaining medications due to cost?  no  Does the patient feel that medications prescribed are effective?  yes Does the patient ever experience any side effects to the medications prescribed? no  Does the patient measure his/her own blood glucose at home?  Yes- Blood sugar 131 Does the patient measure his/her own blood pressure at home? Yes-   Objective: Lab Results  Component Value Date   CREATININE 1.04 01/04/2019   CREATININE 0.97 08/11/2018   CREATININE 0.85 06/20/2018    Lab Results  Component Value Date   HGBA1C 6.3 (A) 01/04/2019    Lipid Panel     Component Value Date/Time   CHOL 140 01/04/2019 1341   TRIG 236.0 (H) 01/04/2019 1341   HDL 45.20 01/04/2019 1341   CHOLHDL 3 01/04/2019 1341   VLDL 47.2 (H) 01/04/2019 1341   LDLCALC 43 03/28/2014 1511   LDLDIRECT 57.0 01/04/2019 1341    BP Readings from Last 3 Encounters:  01/04/19 118/70  11/14/18 125/70  09/21/18 136/78    Allergies  Allergen Reactions  . Food Swelling    bananas  . Penicillins Swelling and Rash    Medications Reviewed Today    Reviewed by Elayne Guerin, [facility] (Pharmacist) on 03/14/19 at 1146  Med List Status:  <None>  Medication Order Taking? Sig Documenting Provider Last Dose Status Informant  albuterol (PROVENTIL) (2.5 MG/3ML) 0.083% nebulizer solution [id number] Yes  [provider] Taking Active            Med Note Merceda Elks Apr 16, 2016  1:10 PM) Received from: External Pharmacy  albuterol (VENTOLIN HFA) 108 (90 Base) MCG/ACT inhaler [id number] Yes Inhale 2 puffs into the lungs every 6 (six) hours as needed. For shortness of breath Janith Lima, MD Taking Active   ALPRAZolam Duanne Moron) 0.25 MG tablet [id number] Yes Take 1 tablet (0.25 mg total) by mouth 2 (two) times daily as needed for anxiety. Janith Lima, MD Taking Active   aspirin 81 MG tablet [id number] Yes Take 81 mg by mouth daily. [provider] Taking Active Self  atorvastatin (LIPITOR) 20 MG tablet [id number] Yes TAKE 1 TABLET BY MOUTH ONCE DAILY Janith Lima, MD Taking Active   carvedilol (COREG) 25 MG tablet [id number] Yes TAKE 1 TABLET BY MOUTH TWICE DAILY WITH MEALS Janith Lima, MD Taking Active   chlorthalidone (HYGROTON) 25 MG tablet [id number] Yes Take 1 tablet (25 mg total) by mouth daily. Janith Lima, MD Taking Active   Cholecalciferol 50000 units capsule [id number] Yes Take 1 capsule (50,000 Units total) by mouth once a week. Janith Lima, MD Taking Active   cyanocobalamin 2000 MCG tablet [id number] Yes Take 1 tablet (2,000 mcg total) by mouth daily. Janith Lima, MD Taking Active   empagliflozin (  JARDIANCE) 25 MG TABS tablet [id number] Yes Take 25 mg by mouth daily. Janith Lima, MD Taking Active   Ferrous Sulfate (IRON) 325 (65 Fe) MG TABS [id number] Yes TAKE 1 TABLET BY MOUTH TWICE DAILY WITH A MEAL Janith Lima, MD Taking Active   fluocinonide-emollient (LIDEX-E) 0.05 % cream [id number] Yes Apply 1 application topically 2 (two) times daily. Janith Lima, MD Taking Active   FREESTYLE TEST STRIPS test strip [id number] Yes USE TO TEST BLOOD SUGAR UP TO 3 TIMES A DAY Janith Lima, MD Taking Active   gabapentin (NEURONTIN) 300 MG capsule [id number] Yes TAKE 1 CAPSULE BY MOUTH THREE TIMES DAILY Janith Lima, MD Taking Active   Insulin Glargine, 1 Unit Dial, (TOUJEO SOLOSTAR) 300 UNIT/ML SOPN [id number] Yes Inject 30 Units into the skin daily. Janith Lima, MD Taking Active   Insulin Pen Needle 31G X 5 MM MISC [id number] Yes Use once daily with insulin Janith Lima, MD Taking Active   irbesartan (AVAPRO) 300 MG tablet [id number] Yes TAKE 1 TABLET BY MOUTH ONCE DAILY [provider] Taking Active   latanoprost (XALATAN) 0.005 % ophthalmic solution [id number] Yes Place 1 drop into both eyes at bedtime. [provider] Taking Active Self  omeprazole (PRILOSEC) 40 MG capsule [id number] Yes TAKE 1 CAPSULE BY MOUTH ONCE DAILY Janith Lima, MD Taking Active   oxyCODONE-acetaminophen (PERCOCET) 7.5-325 MG tablet [id number] Yes Take 1 tablet by mouth every 6 (six) hours as needed for severe pain. Janith Lima, MD Taking Active   potassium chloride SA (K-DUR,KLOR-CON) 20 MEQ tablet [id number] Yes TAKE 1 TABLET BY MOUTH TWICE DAILY Janith Lima, MD Taking Active   SitaGLIPtin-MetFORMIN HCl (JANUMET XR) [phone] MG TB24 [id number] Yes Take 1 tablet by mouth daily. Janith Lima, MD Taking Active   thiamine (VITAMIN B-1) 100 MG tablet [id number] Yes Take 1 tablet (100 mg total) by mouth daily. Janith Lima, MD Taking Active           Assessment:  Drugs sorted by system:  Neurologic/Psychologic: Alprazolam, Gabapentin  Cardiovascular: Aspirin, Atorvastatin, Carvedilol, Chlorthalidone, Irbesartan, (Amlodipine-patient reported still taking although was discontinued in January by Dr. Ronnald Ramp)  Pulmonary/Allergy: Albuterol HFA, Albuterol nebulizer solution  Gastrointestinal: Omeprazole,  Endocrine: Clemetine Marker, Janumet  Topical: Fluocinonide-Emollient  Pain: Oxycodone-APAP  Vitamins/Minerals/Supplements: Cyanocobalamin,  Cholecalciferol, Potassium Chloride, Ferrous Sulfate, Thiamine  Miscellaneous: Latanoprost  Medication Review Findings:  Patient reported taking Amlodipine but it was not on her medication list. Review of her chart revealed Dr.Jones discontinued Amlodipine on 01/04/19.  Patient said she was unaware Amlodipine had been discontinued. In addition, she reported she had been having some low blood pressures. However, her blood pressure today was 158/90 Pulse 80.  She said she is currently out of refills of Amlodipine.  Medication Adherence Findings: Adherence Review  '[x]'  Excellent (no doses missed/week)     '[]'  Good (no more than 1 dose missed/week)     '[]'  Partial (2-3 doses missed/week) '[]'  Poor (>3 doses missed/week)  Patient with excellent understanding of regimen and good understanding of indications.    Potential of compliance: good  Medication Assistance Findings:  Medication assistance needs identified.   Extra Help:  Not eligible for Extra Help Low Income Subsidy based on reported income and assets  Patient Assistance Programs: Janumet made by [employer] o Income requirement met: Yes o Out-of-pocket prescription expenditure met:   Not Applicable - Patient has met application requirements to apply for this patient assistance program.  Jardiance made by [place] requirement met: Yes o Out-of-pocket prescription expenditure met:   Not Applicable - Patient has met application requirements to apply for this patient assistance program.    Toujeo  made by[employer]ofi o Income requirement met: Yes o Out-of-pocket prescription expenditure met:   No ( ) - Patient has not met application requirements to apply for this patient assistance program at this time.     Additional medication assistance options reviewed with patient as warranted:  No other options identified  Plan: . I will route patient assistance letter to [place] technician who will coordinate patient  assistance program application process for medications listed above.  [facility] pharmacy technician will assist with obtaining all required documents from both patient and provider(s) and submit application(s) once completed.  . Will contact Dr. Ronnald Ramp  regarding amlodipine.  . Will follow-up in 4 weeks.  Elayne Guerin, PharmD, [place] Clinical Pharmacist [phone]

## 2019-03-14 NOTE — Patient Outreach (Signed)
[place] [facility]) Care Management  03/14/2019  Valerie [name] 1946/02/10 [id number]   CSW had received referral from [place], [place] for anxiety. CSW attempted to speak with patient, but she asked that CSW call back another day as now was not a good time. CSW will try back again.    Raynaldo Opitz, LCSW [facility]  Clinical Social Worker cell #: [phone]

## 2019-03-16 ENCOUNTER — Other Ambulatory Visit: Payer: Self-pay | Admitting: Pharmacy Technician

## 2019-03-16 ENCOUNTER — Telehealth: Payer: Self-pay | Admitting: Neurology

## 2019-03-16 ENCOUNTER — Other Ambulatory Visit: Payer: Self-pay | Admitting: Neurology

## 2019-03-16 DIAGNOSIS — F329 Major depressive disorder, single episode, unspecified: Secondary | ICD-10-CM | POA: Diagnosis not present

## 2019-03-16 DIAGNOSIS — G4733 Obstructive sleep apnea (adult) (pediatric): Secondary | ICD-10-CM | POA: Diagnosis not present

## 2019-03-16 DIAGNOSIS — I272 Pulmonary hypertension, unspecified: Secondary | ICD-10-CM | POA: Diagnosis not present

## 2019-03-16 DIAGNOSIS — I252 Old myocardial infarction: Secondary | ICD-10-CM | POA: Diagnosis not present

## 2019-03-16 DIAGNOSIS — I509 Heart failure, unspecified: Secondary | ICD-10-CM | POA: Diagnosis not present

## 2019-03-16 DIAGNOSIS — E669 Obesity, unspecified: Secondary | ICD-10-CM | POA: Diagnosis not present

## 2019-03-16 DIAGNOSIS — J324 Chronic pansinusitis: Secondary | ICD-10-CM | POA: Diagnosis not present

## 2019-03-16 DIAGNOSIS — Z87891 Personal history of nicotine dependence: Secondary | ICD-10-CM | POA: Diagnosis not present

## 2019-03-16 DIAGNOSIS — I4819 Other persistent atrial fibrillation: Secondary | ICD-10-CM | POA: Diagnosis not present

## 2019-03-16 DIAGNOSIS — I4892 Unspecified atrial flutter: Secondary | ICD-10-CM | POA: Diagnosis not present

## 2019-03-16 DIAGNOSIS — I130 Hypertensive heart and chronic kidney disease with heart failure and stage 1 through stage 4 chronic kidney disease, or unspecified chronic kidney disease: Secondary | ICD-10-CM | POA: Diagnosis not present

## 2019-03-16 DIAGNOSIS — M1990 Unspecified osteoarthritis, unspecified site: Secondary | ICD-10-CM | POA: Diagnosis not present

## 2019-03-16 DIAGNOSIS — N189 Chronic kidney disease, unspecified: Secondary | ICD-10-CM | POA: Diagnosis not present

## 2019-03-16 DIAGNOSIS — Z955 Presence of coronary angioplasty implant and graft: Secondary | ICD-10-CM | POA: Diagnosis not present

## 2019-03-16 DIAGNOSIS — I2510 Atherosclerotic heart disease of native coronary artery without angina pectoris: Secondary | ICD-10-CM | POA: Diagnosis not present

## 2019-03-16 DIAGNOSIS — Z950 Presence of cardiac pacemaker: Secondary | ICD-10-CM | POA: Diagnosis not present

## 2019-03-16 DIAGNOSIS — E785 Hyperlipidemia, unspecified: Secondary | ICD-10-CM | POA: Diagnosis not present

## 2019-03-16 DIAGNOSIS — Z8673 Personal history of transient ischemic attack (TIA), and cerebral infarction without residual deficits: Secondary | ICD-10-CM | POA: Diagnosis not present

## 2019-03-16 DIAGNOSIS — F419 Anxiety disorder, unspecified: Secondary | ICD-10-CM | POA: Diagnosis not present

## 2019-03-16 DIAGNOSIS — Z79899 Other long term (current) drug therapy: Secondary | ICD-10-CM | POA: Diagnosis not present

## 2019-03-16 DIAGNOSIS — Z7901 Long term (current) use of anticoagulants: Secondary | ICD-10-CM | POA: Diagnosis not present

## 2019-03-16 NOTE — Telephone Encounter (Signed)
Called the patient to make sure she was aware that someone from insurance had called and informed me that she needed to get her supplies from a different DME company due to insurance coverage. No answer. LVM for the pt to call back to inform me if she is ok with this change and if so I will try and get her switched over.  IF Pt calls back, please ask if she is aware that [facility] is no longer covered under her insurance and that insurance is asking that we make a transfer to get her CPAP supplies from [facility]. If she is aware and ok with that change, please inform the pt I will send over the orders to transfer care to [facility] and make her aware their number is [phone]. If she has not heard from them within 1-2 wks please advise her to call. I will not send orders over until I hear back from the patient.

## 2019-03-16 NOTE — Telephone Encounter (Signed)
Order sent for transfer of care

## 2019-03-16 NOTE — Telephone Encounter (Signed)
Pt is aware and stated she agrees to switching care over

## 2019-03-16 NOTE — Telephone Encounter (Signed)
Randal from [facility] called informing us that the pt is needing to order her Cpap supplies from [facility] from now on and not [facility] due to the change in her insurance coverage. Please advise.

## 2019-03-16 NOTE — Patient Outreach (Signed)
[place] [facility]) Care Management  03/16/2019  Valerie [name] [birth year] [id number]   Incoming call from patient stating that [place] Denyse Amass contacted her about patient assistance. She wanted to inform me that she had thrown away applications that were sent out to her at the end of last year. Informed her that [place] mailed out new applications to her. Informed her that applications will need proof if income, and that they will be in a Health Team Advantage envelope.  [name] also states that she wants to apply for Mr. Greens Zetia thru [employer].  Provided [name] with [place] number.  Will create [facility] medication assessment referral for husband.  [name] Chana Bode [place] Certified Pharmacy Technician [place] Management Direct [phone]

## 2019-03-17 ENCOUNTER — Encounter: Payer: Self-pay | Admitting: *Deleted

## 2019-03-17 ENCOUNTER — Other Ambulatory Visit: Payer: Self-pay | Admitting: *Deleted

## 2019-03-17 NOTE — Patient Outreach (Signed)
[place] [facility]) Care Management  03/17/2019  Valerie [name] June 10, 1946 [id number]   CSW was able to make initial contact with patient today to perform phone assessment, as well as assess and assist with social work needs and services.  CSW introduced self, explained role and types of services provided through [place] [facility] ([place] Management).  CSW further explained to patient that CSW works with patient's C-SNP Chronic Care Management Coordinator, also with [place] Management, Valerie [name]. CSW then explained the reason for the call, indicating that [name] thought that patient would benefit from social work services and resources to assist with information and resources for symptoms of anxiety and depression.  CSW obtained two HIPAA compliant identifiers from patient, which included patient's name and date of birth.  Patient stated, "Thank you so much for calling me back, I apologize for not being able to talk with you when you called earlier this week, I was having a really rough day".  CSW explained to patient that she did not speak with CSW, but with CSW's social work Social worker, Valerie [name].  Patient voiced understanding, indicating that she was not trying to be rude to [place], she just simply did not feel like talking to anyone after the day she was having.  Patient went on to say that she gets overwhelmed at times, trying to care for herself, her husband, who currently receives dialysis treatments three days per week, and her disabled son, all living in the home with patient.  CSW allowed patient an opportunity to voice all her concerns, offering counseling and supportive services, where appropriate. Patient admitted to suffering from depression and anxiety in the past, but now believes that she is in a "good place".  Patient further reported that she used to receive counseling for symptoms of anxiety and depression, but feels that she is better  able to manage her symptoms.  Patient indicated that she does not take psychotropic medications, other than Xanax, and that she only takes the Xanax exactly as prescribed.  Patient did not wish for CSW to speak with her Primary Care Physician, Dr. Scarlette [name] about starting her back on an antidepressant medication, nor was patient interested in receiving counseling and supportive services at this time.  Patient was most appreciative of the call and of all services offered through [facility] thus far, reporting that she would like to "personally thank everyone from the bottom of her heart", feeling that everyone is true and sincere about their interest in trying to offer support and assistance.  Patient agreed to take down CSW's contact number and contact CSW directly if she changes her mind and wishes to receive counseling and supportive services in the near future.  CSW even offered to mail patient a list of counselors/therapists in [place], [place] that accept [employer], but patient was not interested.  Patient stated, "If I have another day like I did on Tuesday, I promise to give you a call".  Patient denies feeling homicidal or suicidal.  CSW will perform a case closure on patient, as all goals of treatment have been met from social work standpoint and no additional social work needs have been identified at this time.  CSW will notify patient's C-SNP Chronic Care Management Coordinator with [place] Management, Valerie [name] of CSW's plans to close patient's case.  CSW will fax an update to patient's Primary Care Physician, Dr. Scarlette [name] to ensure that they are aware of CSW's  involvement with patient's plan of care.    Valerie [name], BSW, MSW, LCSW  Licensed Education officer, [facility]  Mailing [place] [address] [place], [place] [address] Physical Address-[address], [place], [place]  [address] Toll Free Main # [phone] Fax # [phone] Cell # [phone]  Office # [phone] [name]

## 2019-03-17 NOTE — Patient Outreach (Deleted)
[place]) Care Management  03/14/2019  Valerie [name] 1946/06/14 [id number]   CSW had received referral from [place], [place] for anxiety. CSW attempted to speak with patient, but she asked that CSW call back another day as now was not a good time. CSW will try back again.    Raynaldo Opitz, LCSW [facility]  Clinical Social Worker cell #: [phone]

## 2019-03-27 ENCOUNTER — Other Ambulatory Visit: Payer: Self-pay | Admitting: Pharmacy Technician

## 2019-03-27 NOTE — Patient Outreach (Signed)
[place] [facility]) Care Management  03/27/2019  [name] 08-28-1946 [id number]   Successful outreach call placed to patient in regards to [place] patient assistance for Jardiance and Merck patient assistance for Janumet and Proventil.  Spoke to patient, HIPAA identifiers verified.  Patient informed that she thinks she has received the applications but was not 100% sure. Patient stated she was about to leave to go to the funeral home as she had a death in her family. Patient informed she would call me back. Confirmed patient had my phone number.  Will followup with patient in 3-5 business days if call is not returned.  [name], [place] Management [phone]

## 2019-03-31 ENCOUNTER — Other Ambulatory Visit: Payer: Self-pay | Admitting: Pharmacy Technician

## 2019-03-31 NOTE — Patient Outreach (Signed)
[place] [facility]) Care Management  03/31/2019  Valerie [name] 20-Dec-1945 [id number]   Incoming call received from patient in regards to Merck patient assistance for Janumet and Proventil HFA and BI patient assistance for [place].  Spoke to patient, HIPAA identifiers verified. Patient was returning a phone call from a few days ago. Patient informed she had searched her house over and could not find her patient assistance applications. Informed patient I would be back in the office on Tuesday April, 21, 2020 and would mail her another set of applications out on that day. Patient was agreeable and informed she would continue to look for them.  Will followup with patient in 7-10 business days to confirm if she has received the 2nd set of applications.  Santos Hardwick P. Patrizia Paule, [place] Management [phone]

## 2019-04-08 DIAGNOSIS — M654 Radial styloid tenosynovitis [de Quervain]: Secondary | ICD-10-CM | POA: Diagnosis not present

## 2019-04-10 ENCOUNTER — Other Ambulatory Visit: Payer: Self-pay | Admitting: Pharmacy Technician

## 2019-04-10 NOTE — Patient Outreach (Signed)
[place] [facility]) Care Management  04/10/2019  Valerie [name] [birth year] [id number]   Successful outreach call placed to patient in regards to Merck application for [place] and BI application for [place].  Spoke to patient, HIPAA identifiers verified.  Inquired if patient had received the 2nd set of applications that were mailed out on 04/04/2019. Patient informed she had not received them yet. Informed patient I would call her back in a few days as our mail has to over to the hospital to have postage paid before it is mailed out. Patient verbalized understanding.  Will outreach paitent again in 3-5 business days.  Valerie [name] P. Yvonne Stopher, [place] Management [phone]

## 2019-04-12 ENCOUNTER — Ambulatory Visit: Payer: HMO | Admitting: Cardiovascular Disease

## 2019-04-13 ENCOUNTER — Other Ambulatory Visit: Payer: Self-pay | Admitting: Internal Medicine

## 2019-04-14 ENCOUNTER — Other Ambulatory Visit: Payer: Self-pay | Admitting: Pharmacist

## 2019-04-14 ENCOUNTER — Telehealth: Payer: Self-pay

## 2019-04-14 ENCOUNTER — Other Ambulatory Visit: Payer: Self-pay | Admitting: Pharmacy Technician

## 2019-04-14 NOTE — Telephone Encounter (Signed)
Copied from [place] [id number]. Topic: Quick Communication - Rx Refill/Question >> Apr 14, 2019 10:51 AM V[place] A[place] D wrote: Medication: Insulin Glargine, 1 Unit Dial, (TOUJEO SOLOSTAR) 300 UNIT/ML SOPN/ Denyse Amass, a pharmacist with [facility] stated pt is now in doughnut hole. They would like to know if Dr. Ronnald Ramp would switch Toujeo to [place] or Levemir because the copay for Nelva Nay is $200 and they are able to give her a coupon for [place] today. Please advise. Contact for Al[place] Ren [phone]  Has the patient contacted their pharmacy? No. (Agent: If no, request that the patient contact the pharmacy for the refill.) (Agent: If yes, when and what did the pharmacy advise?)  Preferred Pharmacy (with phone number or street name): [facility] ([address] [place] [phone] (Phone) [phone] (Fax)    Agent: Please be advised that RX refills may take up to 3 business days. We ask that you follow-up with your pharmacy.   Routing to dr Ronnald Ramp, please advise, thanks

## 2019-04-14 NOTE — Patient Outreach (Signed)
[place] [facility]) Care Management  04/14/2019  TIYONA DESOUZA 30-Sep-1946 [id number]  Successful outreach call placed to patient in regards to [facility] application for Jardiance and Merck application for [employer] and Proventil HFA.  Spoke to patient, HIPAA identifiers verified.  Inquired if patient's application had ceom in the mail. Patient at first said no they had not. Informed patient they would have come in an 8x10 envelope with HealthTeam Advantage on the outside. Inquired if patient had received any mail from [employer]. After a search, Patient informed she found them. We discussed the applications together and patient informed she would place them in the mail today. She also informed that her Toujeo was over $100 when she called the pharmacy yesterday. She also informed the pharmacist told her the next time she refilled it that it would be near $400.   Will route note to [place] concerning patient's insulin costs and the possibility of doing a therapeutic substitution. Will followup with patient in 10-14 business days if applications are not received back.  Daysean Tinkham P. Rani Sisney, [place] Management [phone]

## 2019-04-14 NOTE — Patient Outreach (Signed)
[place] [facility]) Care Management  04/14/2019  Valerie [name] [birth year] [id number]   Called patient to follow up on applications. HIPAA identifiers were obtained. Patient confirmed she received the applications for Janumet and Proventil and will work on sending those back.  She shared that she went to pick up her Toujeo and was told the copay was >$150.  Patient said she does not have the money but has been in conversation with her son about helping her purchase the medication.  Patient was previously assessed for [employer]'s Patient Assistance Program for [facility] but did not meet their $1100 out of pocket expenditure on medications requirement.  According to [facility], the patient's TROOP is:  $145.76. TDS [phone]   Due to COVID, [employer] has an immediate supply coupon that is available and they have waived the medication expense requirement.    If deemed therapeutically appropriate, patient's provider could switch her to a basal insulin product made by [employer] Tyler Aas or Levemir).  If a new prescription is sent, the patient would be able to get up to 2 boxes of insulin at no cost.    If the switch is approved by her provider, we would also send the patient a new application for Novo Nordisk's program to help obtain her insulin at no cost for the rest of the year.  Per chart review, Dr. Ronnald Ramp' nurse sent a note to him however, he is not working today. Patient said she had enough to last her until next week.  Plan: Follow up on Monday.  Elayne Guerin, PharmD, [place] Clinical Pharmacist [phone]

## 2019-04-15 ENCOUNTER — Other Ambulatory Visit: Payer: Self-pay | Admitting: Internal Medicine

## 2019-04-15 DIAGNOSIS — E118 Type 2 diabetes mellitus with unspecified complications: Secondary | ICD-10-CM

## 2019-04-15 MED ORDER — INSULIN DEGLUDEC 200 UNIT/ML ~~LOC~~ SOPN: 30.0000 [IU] | PEN_INJECTOR | Freq: Every day | SUBCUTANEOUS | 1 refills | Status: DC

## 2019-04-15 NOTE — Telephone Encounter (Signed)
Changed to tresiba  TJ

## 2019-04-17 ENCOUNTER — Other Ambulatory Visit: Payer: Self-pay | Admitting: Pharmacist

## 2019-04-17 NOTE — Patient Outreach (Addendum)
[place] [facility]) Care Management  04/17/2019  Valerie [name] 14-Mar-1946 [id number]   Reviewed patient's chart. Dr. Ronnald Ramp' office sent in a new prescription for Tresiba for the patient.  A free coupon from [employer] was faxed to [employer] on the patient's behalf.  Patient will be sent a new patient assistance application for Tresiba since [employer] has relaxed their out of pocket expense requirement. (This is a therapeutic switch from [place]).  Called [facility]. They said they did not have a prescription for the patient. After review, the [facility]. I asked [facility] to transfer the prescription and they said they would and that they would bill the prescription to the coupon.   I will call [facility] back in 2 hours as they requested.  Patient was called. HIPAA identifiers were obtained. Patient was updated on where we are in the process.  Called [facility] and was able to walk the pharmacist through getting the coupon billed. Two boxes of Tyler Aas were filled with a $0 copay.  Patient was called back and she said she will go and pick up the prescription.  Call patient back in 1 month.  Elayne Guerin, PharmD, [place] Clinical Pharmacist [phone]

## 2019-04-18 ENCOUNTER — Other Ambulatory Visit: Payer: Self-pay | Admitting: Internal Medicine

## 2019-04-18 ENCOUNTER — Other Ambulatory Visit: Payer: Self-pay | Admitting: Pharmacy Technician

## 2019-04-18 DIAGNOSIS — E118 Type 2 diabetes mellitus with unspecified complications: Secondary | ICD-10-CM

## 2019-04-18 NOTE — Patient Outreach (Signed)
[facility] [facility]) Care Management  04/18/2019  Valerie [name] 1946/04/07 [id number]                         Medication Assistance Referral  Referral From: [place]  Medication/Company: Tyler Aas / [employer] Patient application portion:  Education officer, museum portion: Faxed  to [name]    Follow up:  Will follow up with patient in 5-10 business days to confirm application(s) have been received.  Baer Hinton P. Chyann Ambrocio, [place] Management [phone]

## 2019-04-27 ENCOUNTER — Other Ambulatory Visit: Payer: Self-pay | Admitting: Pharmacy Technician

## 2019-04-27 NOTE — Patient Outreach (Signed)
[facility] [facility]) Care Management  04/27/2019  Valerie [name] Nov 04, 1946 [id number]   Received all necessary documents and signatures for patient assistance applications with [employer] for Janumet and Proventil HFA and for BI for Jardiance.  Submitted completed Financial risk analyst.  Submitted completed BI application via fax.  Will followup with [employer] in 10-14 business days and with BI in 7-10 business days.  Valerie Rigdon P. Valerie [name], [place] Management [phone]

## 2019-05-01 ENCOUNTER — Other Ambulatory Visit: Payer: Self-pay | Admitting: Pharmacy Technician

## 2019-05-01 ENCOUNTER — Ambulatory Visit: Payer: Self-pay | Admitting: Pharmacist

## 2019-05-01 NOTE — Patient Outreach (Signed)
[facility]) Care Management  05/01/2019  NEDA WILLENBRING 12-Sep-1946 [id number]   Successful outreach call placed to patient in regards to the [employer] application for tresiba.  Spoke to patient, HIPAA identifiers verified.  Patient informed that she has not received the application in the mail. Informed patient that another could be sent out when I am back in the office on 05/02/2019.  Will followup with patient in 3-5 business days from 5/19.  Naylene Foell P. Azlaan Isidore, [place] Management [phone]

## 2019-05-04 ENCOUNTER — Encounter: Payer: Self-pay | Admitting: Internal Medicine

## 2019-05-04 ENCOUNTER — Other Ambulatory Visit (INDEPENDENT_AMBULATORY_CARE_PROVIDER_SITE_OTHER): Payer: HMO

## 2019-05-04 ENCOUNTER — Other Ambulatory Visit: Payer: Self-pay

## 2019-05-04 ENCOUNTER — Telehealth: Payer: Self-pay | Admitting: Emergency Medicine

## 2019-05-04 ENCOUNTER — Ambulatory Visit (INDEPENDENT_AMBULATORY_CARE_PROVIDER_SITE_OTHER): Payer: HMO | Admitting: Internal Medicine

## 2019-05-04 VITALS — BP 136/74 | HR 72 | Temp 97.7°F | Resp 16 | Ht 66.0 in | Wt 250.0 lb

## 2019-05-04 DIAGNOSIS — E785 Hyperlipidemia, unspecified: Secondary | ICD-10-CM

## 2019-05-04 DIAGNOSIS — M48061 Spinal stenosis, lumbar region without neurogenic claudication: Secondary | ICD-10-CM

## 2019-05-04 DIAGNOSIS — Z79891 Long term (current) use of opiate analgesic: Secondary | ICD-10-CM

## 2019-05-04 DIAGNOSIS — E039 Hypothyroidism, unspecified: Secondary | ICD-10-CM | POA: Diagnosis not present

## 2019-05-04 DIAGNOSIS — I10 Essential (primary) hypertension: Secondary | ICD-10-CM | POA: Diagnosis not present

## 2019-05-04 DIAGNOSIS — K760 Fatty (change of) liver, not elsewhere classified: Secondary | ICD-10-CM | POA: Diagnosis not present

## 2019-05-04 DIAGNOSIS — E519 Thiamine deficiency, unspecified: Secondary | ICD-10-CM

## 2019-05-04 DIAGNOSIS — E559 Vitamin D deficiency, unspecified: Secondary | ICD-10-CM

## 2019-05-04 DIAGNOSIS — E118 Type 2 diabetes mellitus with unspecified complications: Secondary | ICD-10-CM | POA: Diagnosis not present

## 2019-05-04 DIAGNOSIS — Z1231 Encounter for screening mammogram for malignant neoplasm of breast: Secondary | ICD-10-CM

## 2019-05-04 DIAGNOSIS — E1142 Type 2 diabetes mellitus with diabetic polyneuropathy: Secondary | ICD-10-CM

## 2019-05-04 DIAGNOSIS — D509 Iron deficiency anemia, unspecified: Secondary | ICD-10-CM

## 2019-05-04 DIAGNOSIS — D510 Vitamin B12 deficiency anemia due to intrinsic factor deficiency: Secondary | ICD-10-CM

## 2019-05-04 LAB — URINALYSIS, ROUTINE W REFLEX MICROSCOPIC
Bilirubin Urine: NEGATIVE
Hgb urine dipstick: NEGATIVE
Ketones, ur: NEGATIVE
Leukocytes,Ua: NEGATIVE
Nitrite: NEGATIVE
Specific Gravity, Urine: <=1.005 — AB (ref 1.000–1.030)
Total Protein, Urine: NEGATIVE
Urine Glucose: >=1000 — AB
Urobilinogen, UA: 0.2 (ref 0.0–1.0)
pH: 6 (ref 5.0–8.0)

## 2019-05-04 LAB — CBC WITH DIFFERENTIAL/PLATELET
Basophils Absolute: 0.1 10*3/uL (ref 0.0–0.1)
Basophils Relative: 0.8 % (ref 0.0–3.0)
Eosinophils Absolute: 0.2 10*3/uL (ref 0.0–0.7)
Eosinophils Relative: 1.4 % (ref 0.0–5.0)
HCT: 36.6 % (ref 36.0–46.0)
Hemoglobin: 12.3 g/dL (ref 12.0–15.0)
Lymphocytes Relative: 23.4 % (ref 12.0–46.0)
Lymphs Abs: 2.5 10*3/uL (ref 0.7–4.0)
MCHC: 33.6 g/dL (ref 30.0–36.0)
MCV: 86.4 fl (ref 78.0–100.0)
Monocytes Absolute: 0.9 10*3/uL (ref 0.1–1.0)
Monocytes Relative: 8.6 % (ref 3.0–12.0)
Neutro Abs: 6.9 10*3/uL (ref 1.4–7.7)
Neutrophils Relative %: 65.8 % (ref 43.0–77.0)
Platelets: 336 10*3/uL (ref 150.0–400.0)
RBC: 4.24 Mil/uL (ref 3.87–5.11)
RDW: 16.5 % — ABNORMAL HIGH (ref 11.5–15.5)
WBC: 10.5 10*3/uL (ref 4.0–10.5)

## 2019-05-04 LAB — TSH: TSH: 3.17 u[IU]/mL (ref 0.35–4.50)

## 2019-05-04 LAB — BASIC METABOLIC PANEL
BUN: 10 mg/dL (ref 6–23)
CO2: 30 mEq/L (ref 19–32)
Calcium: 9.7 mg/dL (ref 8.4–10.5)
Chloride: 97 mEq/L (ref 96–112)
Creatinine, Ser: 0.9 mg/dL (ref 0.40–1.20)
GFR: 74.65 mL/min (ref >60.00)
Glucose, Bld: 124 mg/dL — ABNORMAL HIGH (ref 70–99)
Potassium: 3.6 mEq/L (ref 3.5–5.1)
Sodium: 138 mEq/L (ref 135–145)

## 2019-05-04 LAB — MICROALBUMIN / CREATININE URINE RATIO
Creatinine,U: 29.9 mg/dL
Microalb Creat Ratio: 2.6 mg/g (ref 0.0–30.0)
Microalb, Ur: 0.8 mg/dL (ref 0.0–1.9)

## 2019-05-04 LAB — VITAMIN D 25 HYDROXY (VIT D DEFICIENCY, FRACTURES): VITD: 50.86 ng/mL (ref 30.00–100.00)

## 2019-05-04 LAB — HEMOGLOBIN A1C: Hgb A1c MFr Bld: 6.8 % — ABNORMAL HIGH (ref 4.6–6.5)

## 2019-05-04 MED ORDER — IRON 325 (65 FE) MG PO TABS: ORAL_TABLET | ORAL | 1 refills | Status: DC

## 2019-05-04 MED ORDER — GLUCOSE BLOOD VI STRP: ORAL_STRIP | 11 refills | Status: DC

## 2019-05-04 MED ORDER — ATORVASTATIN CALCIUM 20 MG PO TABS: 20.0000 mg | ORAL_TABLET | Freq: Every day | ORAL | 1 refills | Status: DC

## 2019-05-04 MED ORDER — OXYCODONE-ACETAMINOPHEN 7.5-325 MG PO TABS: 1.0000 | ORAL_TABLET | Freq: Four times a day (QID) | ORAL | 0 refills | Status: DC | PRN

## 2019-05-04 NOTE — Patient Instructions (Signed)
Type 2 Diabetes Mellitus, Diagnosis, Adult Type 2 diabetes (type 2 diabetes mellitus) is a long-term (chronic) disease. In type 2 diabetes, one or both of these problems may be present:  The pancreas does not make enough of a hormone called insulin.  Cells in the body do not respond properly to insulin that the body makes (insulin resistance). Normally, insulin allows blood sugar (glucose) to enter cells in the body. The cells use glucose for energy. Insulin resistance or lack of insulin causes excess glucose to build up in the blood instead of going into cells. As a result, high blood glucose (hyperglycemia) develops. What increases the risk? The following factors may make you more likely to develop type 2 diabetes:  Having a family member with type 2 diabetes.  Being overweight or obese.  Having an inactive (sedentary) lifestyle.  Having been diagnosed with insulin resistance.  Having a history of prediabetes, gestational diabetes, or polycystic ovary syndrome (PCOS).  Being of American-Indian, African-American, Hispanic/Latino, or Asian/Pacific Islander descent. What are the signs or symptoms? In the early stage of this condition, you may not have symptoms. Symptoms develop slowly and may include:  Increased thirst (polydipsia).  Increased hunger(polyphagia).  Increased urination (polyuria).  Increased urination during the night (nocturia).  Unexplained weight loss.  Frequent infections that keep coming back (recurring).  Fatigue.  Weakness.  Vision changes, such as blurry vision.  Cuts or bruises that are slow to heal.  Tingling or numbness in the hands or feet.  Dark patches on the skin (acanthosis nigricans). How is this diagnosed? This condition is diagnosed based on your symptoms, your medical history, a physical exam, and your blood glucose level. Your blood glucose may be checked with one or more of the following blood tests:  A fasting blood glucose (FBG)  test. You will not be allowed to eat (you will fast) for 8 hours or longer before a blood sample is taken.  A random blood glucose test. This test checks blood glucose at any time of day regardless of when you ate.  An A1c (hemoglobin A1c) blood test. This test provides information about blood glucose control over the previous 2-3 months.  An oral glucose tolerance test (OGTT). This test measures your blood glucose at two times: ? After fasting. This is your baseline blood glucose level. ? Two hours after drinking a beverage that contains glucose. You may be diagnosed with type 2 diabetes if:  Your FBG level is 126 mg/dL (7.0 mmol/L) or higher.  Your random blood glucose level is 200 mg/dL (11.1 mmol/L) or higher.  Your A1c level is 6.5% or higher.  Your OGTT result is higher than 200 mg/dL (11.1 mmol/L). These blood tests may be repeated to confirm your diagnosis. How is this treated? Your treatment may be managed by a specialist called an endocrinologist. Type 2 diabetes may be treated by following instructions from your health care provider about:  Making diet and lifestyle changes. This may include: ? Following an individualized nutrition plan that is developed by a diet and nutrition specialist (registered dietitian). ? Exercising regularly. ? Finding ways to manage stress.  Checking your blood glucose level as often as told.  Taking diabetes medicines or insulin daily. This helps to keep your blood glucose levels in the healthy range. ? If you use insulin, you may need to adjust the dosage depending on how physically active you are and what foods you eat. Your health care provider will tell you how to adjust your dosage.    Taking medicines to help prevent complications from diabetes, such as: ? Aspirin. ? Medicine to lower cholesterol. ? Medicine to control blood pressure. Your health care provider will set individualized treatment goals for you. Your goals will be based on  your age, other medical conditions you have, and how you respond to diabetes treatment. Generally, the goal of treatment is to maintain the following blood glucose levels:  Before meals (preprandial): 80-130 mg/dL (4.4-7.2 mmol/L).  After meals (postprandial): below 180 mg/dL (10 mmol/L).  A1c level: less than 7%. Follow these instructions at home: Questions to ask your health care provider  Consider asking the following questions: ? Do I need to meet with a diabetes educator? ? Where can I find a support group for people with diabetes? ? What equipment will I need to manage my diabetes at home? ? What diabetes medicines do I need, and when should I take them? ? How often do I need to check my blood glucose? ? What number can I call if I have questions? ? When is my next appointment? General instructions  Take over-the-counter and prescription medicines only as told by your health care provider.  Keep all follow-up visits as told by your health care provider. This is important.  For more information about diabetes, visit: ? American Diabetes Association (ADA): www.diabetes.org ? American Association of Diabetes Educators (AADE): www.diabeteseducator.org Contact a health care provider if:  Your blood glucose is at or above 240 mg/dL (13.3 mmol/L) for 2 days in a row.  You have been sick or have had a fever for 2 days or longer, and you are not getting better.  You have any of the following problems for more than 6 hours: ? You cannot eat or drink. ? You have nausea and vomiting. ? You have diarrhea. Get help right away if:  Your blood glucose is lower than 54 mg/dL (3.0 mmol/L).  You become confused or you have trouble thinking clearly.  You have difficulty breathing.  You have moderate or large ketone levels in your urine. Summary  Type 2 diabetes (type 2 diabetes mellitus) is a long-term (chronic) disease. In type 2 diabetes, the pancreas does not make enough of a  hormone called insulin, or cells in the body do not respond properly to insulin that the body makes (insulin resistance).  This condition is treated by making diet and lifestyle changes and taking diabetes medicines or insulin.  Your health care provider will set individualized treatment goals for you. Your goals will be based on your age, other medical conditions you have, and how you respond to diabetes treatment.  Keep all follow-up visits as told by your health care provider. This is important. This information is not intended to replace advice given to you by your health care provider. Make sure you discuss any questions you have with your health care provider. Document Released: 11/30/2005 Document Revised: 07/01/2017 Document Reviewed: 01/03/2016 Elsevier Interactive Patient Education  2019 Elsevier Inc.  

## 2019-05-04 NOTE — Telephone Encounter (Signed)
Pt requested a refill on her FREESTYLE TEST STRIPS test strip. Pharmacy is [facility] [address]. Thanks

## 2019-05-04 NOTE — Telephone Encounter (Signed)
erx has been sent.  

## 2019-05-04 NOTE — Progress Notes (Signed)
Subjective:  Patient ID: Valerie [name], female    DOB: [birth year]  Age: 73 y.o. MRN: [id number]  CC: Anemia; Hypertension; Hyperlipidemia; Diabetes; Back Pain; and Osteoarthritis   HPI ANEGLA BRIETZKE presents for f/up - She continues to complain of chronic pain and requests a refill of oxycodone.  She has intermittent low back pain that does not radiate into her lower extremities.  She denies paresthesias.  She also complains of stabbing pain in both feet, worse on the left than the right.  She tells me her blood pressure and blood sugars have been well controlled.  Outpatient Medications Prior to Visit  Medication Sig Dispense Refill  . albuterol (PROVENTIL) (2.5 MG/3ML) 0.083% nebulizer solution     . albuterol (VENTOLIN HFA) 108 (90 Base) MCG/ACT inhaler Inhale 2 puffs into the lungs every 6 (six) hours as needed. For shortness of breath 3 Inhaler 4  . aspirin 81 MG tablet Take 81 mg by mouth daily.    . carvedilol (COREG) 25 MG tablet TAKE 1 TABLET BY MOUTH TWICE DAILY WITH MEALS 180 tablet 1  . chlorthalidone (HYGROTON) 25 MG tablet Take 1 tablet (25 mg total) by mouth daily. 90 tablet 1  . Cholecalciferol 50000 units capsule Take 1 capsule (50,000 Units total) by mouth once a week. 12 capsule 1  . cyanocobalamin 2000 MCG tablet Take 1 tablet (2,000 mcg total) by mouth daily. 90 tablet 1  . empagliflozin (JARDIANCE) 25 MG TABS tablet Take 25 mg by mouth daily. 90 tablet 1  . fluocinonide-emollient (LIDEX-E) 0.05 % cream Apply 1 application topically 2 (two) times daily. 120 g 1  . gabapentin (NEURONTIN) 300 MG capsule TAKE 1 CAPSULE BY MOUTH THREE TIMES DAILY 270 capsule 1  . Insulin Degludec (TRESIBA FLEXTOUCH) 200 UNIT/ML SOPN Inject 30 Units into the skin daily. 9 mL 1  . Insulin Pen Needle 31G X 5 MM MISC Use once daily with insulin 100 each 3  . irbesartan (AVAPRO) 300 MG tablet TAKE 1 TABLET BY MOUTH ONCE DAILY    . latanoprost (XALATAN) 0.005 % ophthalmic solution Place 1  drop into both eyes at bedtime.    [name] omeprazole (PRILOSEC) 40 MG capsule TAKE 1 CAPSULE BY MOUTH ONCE DAILY 90 capsule 1  . potassium chloride SA (K-DUR,KLOR-CON) 20 MEQ tablet TAKE 1 TABLET BY MOUTH TWICE DAILY 180 tablet 1  . SitaGLIPtin-MetFORMIN HCl (JANUMET XR) [phone] MG TB24 Take 1 tablet by mouth daily. 90 tablet 1  . thiamine (VITAMIN B-1) 100 MG tablet Take 1 tablet (100 mg total) by mouth daily. 90 tablet 1  . ALPRAZolam (XANAX) 0.25 MG tablet Take 1 tablet (0.25 mg total) by mouth 2 (two) times daily as needed for anxiety. 180 tablet 1  . amLODipine (NORVASC) 10 MG tablet Take 10 mg by mouth daily.    [name] atorvastatin (LIPITOR) 20 MG tablet TAKE 1 TABLET BY MOUTH ONCE DAILY 90 tablet 1  . Ferrous Sulfate (IRON) 325 (65 Fe) MG TABS TAKE 1 TABLET BY MOUTH TWICE DAILY WITH A MEAL 180 tablet 1  . FREESTYLE TEST STRIPS test strip USE TO TEST BLOOD SUGAR UP TO 3 TIMES A DAY 100 each 11  . oxyCODONE-acetaminophen (PERCOCET) 7.5-325 MG tablet Take 1 tablet by mouth every 6 (six) hours as needed for severe pain. 90 tablet 0   No facility-administered medications prior to visit.     ROS Review of Systems  Constitutional: Negative.  Negative for diaphoresis, fatigue and unexpected weight change.  HENT:  Negative.   Eyes: Negative for visual disturbance.  Respiratory: Negative for cough, chest tightness, shortness of breath and wheezing.   Gastrointestinal: Negative for abdominal pain, constipation, diarrhea, nausea and vomiting.  Endocrine: Negative for cold intolerance, heat intolerance, polydipsia, polyphagia and polyuria.  Genitourinary: Negative.  Negative for difficulty urinating.  Musculoskeletal: Positive for arthralgias and back pain. Negative for myalgias and neck pain.  Skin: Negative.  Negative for color change, pallor and rash.  Neurological: Negative.  Negative for dizziness, weakness and light-headedness.  Hematological: Negative for adenopathy. Does not bruise/bleed easily.    Psychiatric/Behavioral: Negative.     Objective:  BP 136/74 (BP Location: Left Arm, Patient Position: Sitting, Cuff Size: Large)   Pulse 72   Temp 97.7 F (36.5 C) (Oral)   Resp 16   Ht 5' 6"$  (1.676 m)   Wt 250 lb (113.4 kg)   SpO2 98%   BMI 40.35 kg/m   BP Readings from Last 3 Encounters:  05/04/19 136/74  01/04/19 118/70  11/14/18 125/70    Wt Readings from Last 3 Encounters:  05/04/19 250 lb (113.4 kg)  01/04/19 251 lb (113.9 kg)  11/14/18 250 lb 9.6 oz (113.7 kg)    Physical Exam Vitals signs reviewed.  Constitutional:      Appearance: She is obese. She is not ill-appearing or diaphoretic.  HENT:     Nose: Nose normal. No congestion.     Mouth/Throat:     Mouth: Mucous membranes are moist.  Eyes:     General: No scleral icterus.    Conjunctiva/sclera: Conjunctivae normal.  Neck:     Musculoskeletal: Normal range of motion and neck supple. No neck rigidity.  Cardiovascular:     Rate and Rhythm: Normal rate and regular rhythm.     Heart sounds: No murmur. No gallop.   Pulmonary:     Effort: Pulmonary effort is normal. No respiratory distress.     Breath sounds: No stridor. No wheezing, rhonchi or rales.  Abdominal:     General: Abdomen is flat.     Palpations: There is no mass.     Tenderness: There is no abdominal tenderness. There is no guarding.  Musculoskeletal: Normal range of motion.        General: No swelling.     Right lower leg: No edema.     Left lower leg: No edema.  Lymphadenopathy:     Cervical: No cervical adenopathy.  Skin:    General: Skin is warm and dry.  Neurological:     General: No focal deficit present.  Psychiatric:        Mood and Affect: Mood normal.        Behavior: Behavior normal.     Lab Results  Component Value Date   WBC 10.5 05/04/2019   HGB 12.3 05/04/2019   HCT 36.6 05/04/2019   PLT 336.0 05/04/2019   GLUCOSE 124 (H) 05/04/2019   CHOL 140 01/04/2019   TRIG 236.0 (H) 01/04/2019   HDL 45.20 01/04/2019    LDLDIRECT 57.0 01/04/2019   LDLCALC 43 03/28/2014   ALT 17 01/04/2019   AST 17 01/04/2019   NA 138 05/04/2019   K 3.6 05/04/2019   CL 97 05/04/2019   CREATININE 0.90 05/04/2019   BUN 10 05/04/2019   CO2 30 05/04/2019   TSH 3.17 05/04/2019   INR 1.06 08/29/2015   HGBA1C 6.8 (H) 05/04/2019   MICROALBUR 0.8 05/04/2019    No results found.  Assessment & Plan:   Lorelei was  seen today for anemia, hypertension, hyperlipidemia, diabetes, back pain and osteoarthritis.  Diagnoses and all orders for this visit:   HTN - her blood pressure is well controlled.  Electrolytes and renal function are normal. -     Basic metabolic panel; Future -     Urinalysis, Routine w reflex microscopic; Future  Type II diabetes mellitus with manifestations ([place])- Her A1c is at 6.8%.  Her blood sugars are adequately well controlled. -     Basic metabolic panel; Future -     Hemoglobin A1c; Future -     Microalbumin / creatinine urine ratio; Future  Vitamin B12 deficiency anemia due to intrinsic factor deficiency- Will continue B12 replacement therapy. -     CBC with Differential/Platelet; Future  Thiamine deficiency, unspecified- Will continue thiamine replacement therapy. -     CBC with Differential/Platelet; Future -     Vitamin B1; Future  Fatty liver disease, nonalcoholic- Her LFTs are normal now.  Hypothyroidism, unspecified type- Her TSH is in the normal range.  She will remain on the current dose of levothyroxine. -     TSH; Future  Long-term current use of opiate analgesic- Her urine drug screen is negative for opiates because she ran out of oxycodone.  She also does not take Xanax anymore which is why the urine drug screen was negative for benzos. -     Pain Mgmt, Profile 8 w/Conf, U; Future  Vitamin D deficiency disease -     VITAMIN D 25 Hydroxy (Vit-D Deficiency, Fractures); Future  Spinal stenosis of lumbar 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 -     oxyCODONE-acetaminophen (PERCOCET) 7.5-325  MG tablet; Take 1 tablet by mouth every 6 (six) hours as needed for severe pain.  Iron deficiency anemia -     Ferrous Sulfate (IRON) 325 (65 Fe) MG TABS; TAKE 1 TABLET BY MOUTH TWICE DAILY WITH A MEAL  Type 2 diabetes mellitus with complication (HCC)  Diabetic polyneuropathy associated with type 2 diabetes mellitus ([place]) -     oxyCODONE-acetaminophen (PERCOCET) 7.5-325 MG tablet; Take 1 tablet by mouth every 6 (six) hours as needed for severe pain.  Hyperlipidemia with target LDL less than 100 -     atorvastatin (LIPITOR) 20 MG tablet; Take 1 tablet (20 mg total) by mouth daily.  Visit for screening mammogram -     MM DIGITAL SCREENING BILATERAL; Future   I have discontinued Pamala Hurry A. Halling's ALPRAZolam and amLODipine. I have also changed her atorvastatin. Additionally, I am having her maintain her latanoprost, aspirin, albuterol, albuterol, irbesartan, Cholecalciferol, cyanocobalamin, fluocinonide-emollient, carvedilol, potassium chloride SA, omeprazole, Insulin Pen Needle, thiamine, empagliflozin, SitaGLIPtin-MetFORMIN HCl, chlorthalidone, gabapentin, Insulin Degludec, oxyCODONE-acetaminophen, and Iron.  Meds ordered this encounter  Medications  . oxyCODONE-acetaminophen (PERCOCET) 7.5-325 MG tablet    Sig: Take 1 tablet by mouth every 6 (six) hours as needed for severe pain.    Dispense:  90 tablet    Refill:  0  . Ferrous Sulfate (IRON) 325 (65 Fe) MG TABS    Sig: TAKE 1 TABLET BY MOUTH TWICE DAILY WITH A MEAL    Dispense:  180 tablet    Refill:  1  . atorvastatin (LIPITOR) 20 MG tablet    Sig: Take 1 tablet (20 mg total) by mouth daily.    Dispense:  90 tablet    Refill:  1     Follow-up: Return in about 6 months (around 11/04/2019).  [name], MD

## 2019-05-09 ENCOUNTER — Other Ambulatory Visit: Payer: Self-pay | Admitting: Pharmacy Technician

## 2019-05-09 NOTE — Patient Outreach (Signed)
[facility]) Care Management  05/09/2019  Valerie [name] 1946/12/01 [id number]    Incoming call received from patient in regards to [employer] application for [place]. HIPAA identifiers verified.   Patient was calling to inform that she had received the application in the mail. Patient called with a few questions about the application. Informed patient the only thing she has to do is to sign it in the 3 places as we already have her proof of income and copy of her insurance cards. Patient verbalized understanding.  Will followup with patient in 10-14 business days if application not received.  [name] P. [name], [place] Management [phone]

## 2019-05-10 ENCOUNTER — Other Ambulatory Visit: Payer: Self-pay | Admitting: Pharmacy Technician

## 2019-05-10 NOTE — Patient Outreach (Signed)
[place] [facility]) Care Management  05/10/2019  Valerie [name] 1946-01-21 [id number]   Care coordination call placed to BI in regards to patient's Jardiance application.  Spoke to [place] who informed patient had been APPROVED 05/02/2019-12/14/2019 for 90 tabs/90 days supply. Erasmo Downer informed that the medication was shipped out on 05/04/2019 with an expected delivery of 05/11/2019 by 9pm. The tracking number is [id number].  Will followup with patient in 3-7 business days to confirm receipt of medication.  Tadhg Eskew P. Camrin Gearheart, [place] Management [phone]

## 2019-05-11 ENCOUNTER — Other Ambulatory Visit: Payer: Self-pay | Admitting: Pharmacy Technician

## 2019-05-11 NOTE — Patient Outreach (Signed)
[name]) Care Management  05/11/2019  Valerie [name] 28-Jun-1946 [id number]    Received all necessary documents and signatures from both patient and provider for [employer] patient assistance for Tresiba.  Submitted  Completed application via fax to [employer].  Will followup with [place] in 2-5 business days to inquire on status of application.  Rickayla Wieland P. Wenceslaus Gist, [place] Management [phone]

## 2019-05-12 ENCOUNTER — Other Ambulatory Visit: Payer: Self-pay | Admitting: Pharmacy Technician

## 2019-05-12 NOTE — Patient Outreach (Signed)
[place] [facility]) Care Management  05/12/2019  Valerie [name] August 19, 1946 [id number]   Care coordination call placed to [employer] patient assistance in regards to patient's application for Janumet and Proventil.  Spoke to [place] who informed they had received the application on 5/36/6440 and had mailed out the attestation letter. Once attestation letter is received back then the application process can resume. Patient should expect the attestation letter in 7-14 business days.  Successful outreach call placed to patient in regards to attestation letter. HIPAA identifiers verified.  Informed patient to be expecting the attestation letter and to call me when she has received it. Patient verbalized understanding. Patient informed she has not received the Jardiance yet but will be checking her mail box daily as it it due to be delivered any time within the net week.  Will followup with patient in 10-14 business days if call is not returned.  Khya Halls P. Patrece Tallie, [place] Management [phone]

## 2019-05-14 LAB — PAIN MGMT, PROFILE 8 W/CONF, U
6 Acetylmorphine: NEGATIVE ng/mL
Alcohol Metabolites: NEGATIVE ng/mL (ref <500)
Amphetamines: NEGATIVE ng/mL
Benzodiazepines: NEGATIVE ng/mL
Buprenorphine, Urine: NEGATIVE ng/mL
Cocaine Metabolite: NEGATIVE ng/mL
Creatinine: 30.4 mg/dL
MDMA: NEGATIVE ng/mL
Marijuana Metabolite: NEGATIVE ng/mL
Opiates: NEGATIVE ng/mL
Oxidant: NEGATIVE ug/mL
Oxycodone: NEGATIVE ng/mL
pH: 5.7 (ref 4.5–9.0)

## 2019-05-14 LAB — VITAMIN B1: Vitamin B1 (Thiamine): 22 nmol/L (ref 8–30)

## 2019-05-16 ENCOUNTER — Other Ambulatory Visit: Payer: Self-pay | Admitting: Pharmacy Technician

## 2019-05-16 NOTE — Patient Outreach (Signed)
[place] [facility]) Care Management  05/16/2019  Valerie [name] 1946/10/22 [id number]    Spoke to [place] at [facility] and [place] at [facility] in regards to patient's BI application for Jardiance.  Informed that the patient has not received the medication and that according to the tracking number the USPS has never received the package from [place] [employer] .ALeah transferred me to Bernette Mayers at [facility] informed it appears the package was lost and he had [place] set up a 2 day delivery of the medication. He informed the med would go out on 05/17/2019 and be delivered on 05/19/2019.  Will followup with patient in 5-7 days to inquire if she has received the medication.  Valerie [name] P. Necia Kamm, [place] Management [phone]

## 2019-05-17 ENCOUNTER — Other Ambulatory Visit: Payer: Self-pay | Admitting: Pharmacy Technician

## 2019-05-17 NOTE — Patient Outreach (Signed)
[place]) Care Management  05/17/2019  Valerie [name] [birth year] [id number]    Care coordination call placed to [place] in regards to patient's application for Tresiba.  Spoke to [place] who informed patient had been APPROVED for the program 05/12/2019-11/13/2019. [place] informed that the order id was [id number] and that the order was in processed and would be delivered to the provider's office in 7-10 business days.  Will followup with patient in 7-14 business days to confirm she has received the medication.  Coco Sharpnack P. Jaimere Feutz, [place] Management [phone]

## 2019-05-17 NOTE — Patient Outreach (Signed)
[facility]) Care Management  05/17/2019  Valerie [name] 21-Sep-1946 [id number]    Incoming call received from patient in regards to [employer] patient assistance application for Janumet and Proventil HFA.  Spoke to patient, HIPAA identifiers verified.  Patient was calling to inform that she received the attestation letter from [employer]. Discussed with patient the letter and the questions that needed to be answered. Patient verbalized was able to answer the questions and sign the form. Informed patient to place all the information that was sent to her including the form into the pre paid envelope that [employer] sent and mail it back to [employer]. Informed patient I would followup with [employer] to inquire if they have received it back and when the medications would be delivered. Patient verbalized understanding.  Will followup with [employer] in 10-14 business days to inquire if they have received the attestation letter and mailed out the medications.  Armani Gawlik P. Lonnell Chaput, [place] Management [phone]

## 2019-05-22 ENCOUNTER — Telehealth: Payer: Self-pay | Admitting: Neurology

## 2019-05-22 ENCOUNTER — Other Ambulatory Visit: Payer: Self-pay | Admitting: Pharmacy Technician

## 2019-05-22 ENCOUNTER — Other Ambulatory Visit: Payer: Self-pay | Admitting: Neurology

## 2019-05-22 DIAGNOSIS — G4733 Obstructive sleep apnea (adult) (pediatric): Secondary | ICD-10-CM

## 2019-05-22 NOTE — Telephone Encounter (Signed)
Order was already sent to [facility] but I have messaged them to make sure nothing else was needed from [place].

## 2019-05-22 NOTE — Telephone Encounter (Signed)
Pt states she called [facility] about her cpap going out on her and she said they informed her that the provider needs to call them and let them know that it's ok to use them. Please advise.

## 2019-05-22 NOTE — Telephone Encounter (Signed)
"  The TOC was never completed, so I reached out to the pt on 05/12/19 to see if she was still wanting to switch to [place]. Pt stated on that call that the motor on her BiPAP has stopped working, so she is needing a new machine. [employer] no longer accepts her insurance, and she got her last machine 12/29/13. Let the pt know that we can get her set up with a new machine, but we need orders for one. I think she got confused somewhere along the way, because I asked her on 05/29 to reach out to your office for a machine order.   Anyway, we will need an order for a new machine and recent notes. We already have her PSG and BiPAP titration on file. Let me know if you need anything else from me!"  I have sent the order over for a new machine for the patient.

## 2019-05-22 NOTE — Patient Outreach (Signed)
[place] [facility]) Care Management  05/22/2019  Valerie [name] Jan 25, 1946 [id number]   Incoming call received from patient.  Spoke to patient, HIPAA identifiers verified.  Patient called to inform me that she had received her 90 days supply of Jardiance through [facility]. She also informed she had received a letter from [employer] saying she had been approved for [place].  Informed patient that Dr. Ronnald Ramp' office should outreach her when that medication has arrived;hopefully sometime this week. Patient verbalized understanding.  Will followup with patient concerning her other patient assistance applications as previously noted.  Chaunice Obie P. Nathanie Ottley, [place] Management [phone]

## 2019-05-23 ENCOUNTER — Other Ambulatory Visit: Payer: Self-pay | Admitting: Internal Medicine

## 2019-05-23 DIAGNOSIS — E559 Vitamin D deficiency, unspecified: Secondary | ICD-10-CM

## 2019-05-25 ENCOUNTER — Telehealth: Payer: Self-pay

## 2019-05-25 NOTE — Telephone Encounter (Signed)
Patient advised that her patient assistance insulin and pens have arrived, will be placed on side A refrigerator, patient will pick up at her earliest convenience

## 2019-05-26 ENCOUNTER — Other Ambulatory Visit: Payer: Self-pay | Admitting: Pharmacy Technician

## 2019-05-26 NOTE — Patient Outreach (Signed)
[place] [facility] Care Management  05/26/2019  Valerie [name] [birth year] [id number]   Care coordination call placed to [employer] in regards to patient's application for Janumet XR and Proventil.  Spoke to [place] who informed patient had been APPROVED 05/22/2019-12/14/2019. [name] informed patient should be receiving the medication in 10-14 business days at her home.  Will followup with patient in 10-14 business days to inquire if she has received the medication.  Camerin Ladouceur P. Louie Flenner, [place] Management [phone]

## 2019-05-29 ENCOUNTER — Other Ambulatory Visit: Payer: Self-pay | Admitting: Pharmacy Technician

## 2019-05-29 NOTE — Patient Outreach (Signed)
[place] [facility]) Care Management  05/29/2019  Valerie [name] 06-05-1946 [id number]   Unsuccessful outreach call placed to patient in regards to [employer] application for [place].  Unfortunately patient did not answer the phone, HIPAA compliant voicemail left.  Was calling patient to inquire if she had picked up the [place] from HCP office.  Will followup with patient in 3-5 business days if call is not returned.  Domingos Riggi P. Urie Loughner, [place] Management [phone]

## 2019-06-01 ENCOUNTER — Other Ambulatory Visit: Payer: Self-pay | Admitting: Pharmacy Technician

## 2019-06-01 NOTE — Patient Outreach (Signed)
[place] [facility]) Care Management  06/01/2019  Valerie [name] 1946/10/22 [id number]    Successful outreach call placed to patient in regards to [employer] application for Tresiba.   Spoke to patient, HIPAA identifiers verified.  Patient informed she received 2 boxes of Tresiba and 2 boxes of needles. Informed patient the program ends on 11/13/2019 and that she would need to call her provider's office before that date to request a refill. The refill must be sent to [employer] by the provider's office in order for the patient to receive it free of charge. Patient verbalized understanding.  Patient inquired about her Janumet and Proventil with [employer] patient assistance and when it would be delivered. Informed patient that the company had said it could take anywhere from 10-14 business days to arrive at her home. Informed her that hopefully by the end of the month the medication should be there. Patient verbalized understanding.  Will followup towards the end of June to inquire if patient received the [employer] medications.  Kaitlyn Skowron P. Shahil Speegle, [place] Management [phone]

## 2019-06-02 DIAGNOSIS — G4733 Obstructive sleep apnea (adult) (pediatric): Secondary | ICD-10-CM | POA: Diagnosis not present

## 2019-06-05 ENCOUNTER — Telehealth: Payer: Self-pay | Admitting: Neurology

## 2019-06-05 NOTE — Telephone Encounter (Signed)
Called the pt. I received a notification that the patient needs to have an apt for her new CPAP within the dates of 07/03/19-08/31/19. I have LVM informing the pt pf this and asked the pt to call back and move her nov apt up to be within this time frame.

## 2019-06-05 NOTE — Telephone Encounter (Signed)
Patient called back and I scheduled her for July 27 th at 1:00 . Valerie [name] I hope this is ok.

## 2019-06-12 ENCOUNTER — Other Ambulatory Visit: Payer: Self-pay | Admitting: Pharmacy Technician

## 2019-06-12 ENCOUNTER — Other Ambulatory Visit: Payer: Self-pay | Admitting: Pharmacist

## 2019-06-12 NOTE — Patient Outreach (Signed)
[place] [facility]) Care Management  06/12/2019  [name] 1946/02/02 [id number]   Patient was called to follow up on medication assistance. HIPAA identifiers were obtained.  Patient said she had not received her Janumet and Proventil from [employer].  She was encouraged to call [facility] to get a delivery date estimate and to be sure they did not need additional information.  Patient received Tyler Aas and said she is able to use it without complication.  She did not have a lot of time to talk to me as she said her husband was currently hospitalized and she needed to speak with his provider because he had not had dialysis during his hospitalization.  Plan: Susy Frizzle will continue to follow up with the patient.   Elayne Guerin, PharmD, [place] Clinical Pharmacist [phone]

## 2019-06-12 NOTE — Patient Outreach (Signed)
[place] [facility]) Care Management  06/12/2019  Valerie [name] 1946/11/19 [id number]   Addendum Successful outreach call placed to patient in regards to [employer] application for Janumet and Proventil.  Spoke to patient, HIPAA identifiers verified.  Patient informed she has not received the medication. She informed she called [employer] and was provided with the tracking number. [employer] representative informed patient to followup with postal office.  Informed patient I had spoken to [employer] as well and was informed that the medication would be delivered this week and that the postal service was running behind schedule of the expected delivery dates. Patient informed if medication did not arrive today then she would either call or go by the postal office.  Will followup with patient in 5-7 business days about medication delivery.  Beckey Polkowski P. Kyros Salzwedel, [place] Management [phone]

## 2019-06-12 NOTE — Patient Outreach (Signed)
[place] [facility]) Care Management  06/12/2019  EDISON WOLLSCHLAGER [birth year] [id number]    Care coordination call placed to [employer] patient assistance in regards to patient's application for Janumet and Proventil HFA.  Spoke to [employer] at [employer] who informed the medication was shipped out on 05/30/2019 and had an expected arrival date of 06/06/2019. She informed she would have to transfer me to [facility] for additional information.  Spoke to [place] at [employer] who informed typical delivery takes 7-10 business days but they had been informed that the postal service is running 1-3 business days behind schedule. She informed the medication should arrive this week. She provided me with tracking number of [id number]. However at the time of my call the USPS tracking website was down.  Will followup with patient concerning delivery.  Delories Mauri P. Melitza Metheny, [place] Management [phone]

## 2019-06-12 NOTE — Patient Outreach (Signed)
[facility]) Care Management  06/12/2019  Valerie [name] 23-Oct-1946 [id number]   ADDENDUM  Incoming call received from patient, HIPAA identifiers verified.  Spoke to patient who informed the postal office did not have her package and informed her they have not received it from [employer]. They suggested she call [employer] back. Patient informed she called [employer] and they will overnight the package via UPS and should arrive to patient's home on 06/13/2019. Patient will outreach me if medication arrives.  Will followup with patient in 5-7 business days if call is not returned.  Ariadna Setter P. Ata Pecha, [place] Management [phone]

## 2019-06-13 ENCOUNTER — Other Ambulatory Visit: Payer: Self-pay

## 2019-06-13 ENCOUNTER — Other Ambulatory Visit: Payer: Self-pay | Admitting: Pharmacy Technician

## 2019-06-13 NOTE — Patient Outreach (Signed)
  [place] [facility]) Care Management Chronic Special Needs Program  06/13/2019  Name: Valerie [name] DOB: [birth year]  MRN: [id number]  Ms. Danai Gotto is enrolled in a chronic special needs plan for Diabetes. Reviewed and updated care plan.  Subjective: Client reports her husband is in the hospital and states she needs to follow up with the hospital about her husband. She request RNCM call her back later today. RNCM called back. Client received a call from the hospital before [facility] was able to complete the call.   Plan: RNCM will follow up with client tomorrow per client request.    [name], RN, MSN, [place] [phone]   .

## 2019-06-13 NOTE — Patient Outreach (Signed)
[place] [facility]) Care Management  06/13/2019  Valerie [name] 03-Nov-1946 [id number]    Incoming call received from patient, HIPAA identifiers verified.  Patient was calling to inform she received 90 days supply of Janumet and 3 Proventil HFA inhalers. Discussed refill procedure with the patient. Patient verbalized understanding.  Discussed and reviewed refill procedure for all of patient's medication assistance medications including the one mentioned above as well as Jardiance with [employer] and [place] with [employer]. Patient verbalized understanding.  Patient informed she had no other questions or concerns as it relates to patient assistance. Confirmed patient had name and number if needed.  Will route note to [place] that patient assistance has been completed and will remove myself from care team.  [name]. Rettie Laird, [place] Management [phone]

## 2019-06-14 ENCOUNTER — Other Ambulatory Visit: Payer: Self-pay

## 2019-06-14 ENCOUNTER — Telehealth: Payer: Self-pay | Admitting: Internal Medicine

## 2019-06-14 NOTE — Patient Outreach (Addendum)
[place] [facility]) Care Management Chronic Special Needs Program  06/14/2019  Name: Valerie [name] DOB: August 25, 1946  MRN: [id number]  [name] is enrolled in a chronic special needs plan for Diabetes. Reviewed and updated care plan.  Subjective: RNCM spoke with client who reports this is a good time to talk. She reports her husband is still in the hospital and is doing better. Currently she reports she is able to afford her medications. She reports that taking care of her husband and the un-expectant loss her dog has been difficult. PHQ2 score 3; PHQ9 score 11. Client declines social work referral. She reports she will call RNCM if she feels like she needs assistance.  Goals Addressed            This Visit's Progress   .  Acknowledge receipt of Advanced Directive package   On track   .  Diabetes Patient stated goal-"Loose weight" (pt-stated)   On track    Continue previous goal to increase activity to approximately 150 minutes per week/ 20 minutes of activity per day.      . Advanced Care Planning complete as directed by client within the next 6-9 months.   On track   . COMPLETED: Client understands the importance of follow-up with providers by attending scheduled visits       Voiced understanding.    . Client will report improved coping within the next 6-9 months.   No change    Continue goals for increased activity. Continue prayer life.  Education Handout-Coping with a Health condition: what you can do. Carve out time for yourself Expectations of others-Learn to say no when appropriate.  Reduce stress.    . Client will use Assistive Devices as needed and verbalize understanding of device use   On track    Denies any questions or issues with use of glucometer.    . COMPLETED: Client will verbalize knowledge of self management of Hypertension as evidences by BP reading of 140/90 or less; or as defined by provider       Follow up with providers as scheduled,  take medications as prescribed, monitor home blood pressure as recommended by provider and as needed, monitor sodium intake.    [name] Exercise 150 minutes per week (moderate activity)   Not on track    Will check [employer] and start back exercising if she can;  Continue to work on this goal within the area of her home, reminder of social distancing.     . Maintain timely refills of diabetic medication as prescribed within the year .   On track   . COMPLETED: Obtain annual  Lipid Profile, LDL-C       Completed 01/04/2019 per Epic.    . Obtain Annual Eye (retinal)  Exam    On track   . Obtain Annual Foot Exam   On track   . Obtain annual screen for micro albuminuria (urine) , nephropathy (kidney problems)   On track    Completed 05-04-19 per chart.    . COMPLETED: Obtain Hemoglobin A1C at least 2 times per year       Completed 01/04/19 Hemoglobin  Completed 05/04/19 Hemoglobin     . COMPLETED: Visit Primary Care Provider or Endocrinologist at least 2 times per year        Office visit 01/04/2019 Office visit 05/04/2019      RNCM encouraged client to call as needed. RNCM encouraged client to call the concierge for any benefits  questions. RNCM provided the contact number for the [place] Help line and encouraged to call as needed..  Primary care office called and notified of client's PHQ2/PHQ9 scores.  Plan: Chronic care management coordinator will outreach in:  3 Months. Send Neurosurgeon. Send another copy of advanced directive per client request.   [name], RN, MSN, [place] [phone]

## 2019-06-14 NOTE — Telephone Encounter (Signed)
Copied from [place] [phone]. Topic: General - Other >> Jun 14, 2019  4:21 PM Keene Breath wrote: Reason for CRM: Called to give the doctor a message for the patients - PHQ 2 score =3, PHQ 9 score = 11.  Stated she also sent a noted to the doctor.  If there are any questions, please call at [phone]

## 2019-06-26 DIAGNOSIS — H33193 Other retinoschisis and retinal cysts, bilateral: Secondary | ICD-10-CM | POA: Diagnosis not present

## 2019-06-26 DIAGNOSIS — E119 Type 2 diabetes mellitus without complications: Secondary | ICD-10-CM | POA: Diagnosis not present

## 2019-06-26 DIAGNOSIS — H40013 Open angle with borderline findings, low risk, bilateral: Secondary | ICD-10-CM | POA: Diagnosis not present

## 2019-06-26 DIAGNOSIS — Z961 Presence of intraocular lens: Secondary | ICD-10-CM | POA: Diagnosis not present

## 2019-06-26 DIAGNOSIS — H25811 Combined forms of age-related cataract, right eye: Secondary | ICD-10-CM | POA: Diagnosis not present

## 2019-06-26 LAB — HM DIABETES EYE EXAM

## 2019-06-29 ENCOUNTER — Encounter: Payer: Self-pay | Admitting: Internal Medicine

## 2019-06-29 NOTE — Progress Notes (Signed)
Abstracted and sent to scan  

## 2019-07-02 DIAGNOSIS — G4733 Obstructive sleep apnea (adult) (pediatric): Secondary | ICD-10-CM | POA: Diagnosis not present

## 2019-07-05 ENCOUNTER — Encounter: Payer: Self-pay | Admitting: Neurology

## 2019-07-06 ENCOUNTER — Other Ambulatory Visit: Payer: Self-pay | Admitting: Pharmacist

## 2019-07-06 NOTE — Patient Outreach (Signed)
[place] [facility]) Care Management  07/06/2019  RICK WARNICK [birth year] [id number]   Care coordination call placed [facility] to verify the last fill date of atorvastatin 20 mg as the patient was on the CSNP/SUPD list for having diabetes but not taking a statin.  [facility] data showed Atorvastatin 20 mg being filled on 05/04/2019 and this was verified by [facility].  Elayne Guerin, PharmD, [place] Clinical Pharmacist [phone]

## 2019-07-10 ENCOUNTER — Ambulatory Visit: Payer: HMO | Admitting: Neurology

## 2019-07-10 ENCOUNTER — Other Ambulatory Visit: Payer: Self-pay

## 2019-07-10 ENCOUNTER — Encounter: Payer: Self-pay | Admitting: Neurology

## 2019-07-10 VITALS — BP 128/79 | HR 78 | Temp 98.4°F | Ht 66.0 in | Wt 256.0 lb

## 2019-07-10 DIAGNOSIS — Z6841 Body Mass Index (BMI) 40.0 and over, adult: Secondary | ICD-10-CM | POA: Diagnosis not present

## 2019-07-10 DIAGNOSIS — G4733 Obstructive sleep apnea (adult) (pediatric): Secondary | ICD-10-CM | POA: Diagnosis not present

## 2019-07-10 NOTE — Progress Notes (Signed)
[facility]  Provider:  Dr Khaliel Morey Referring Provider: Janith Lima, MD Primary Care Physician:  Janith Lima, MD  Chief Complaint  Patient presents with  . Follow-up    pt alone, rm 10. pt states that her new machine is working well. DME [facility]    Interval history from 07-10-2019, I am seeing [name] , a meanwhile  73 y.o. female with a history of sleep disordered breathing. She has been followed by Cecille Rubin, NP for the last 3 visits. She is main caretaker of her very sick husband, who has become ' mean' . He has PT at the home, and declined any home health-- which leaves her to be the caretaker. He was hospitalized 6 times over the last 12 month.   Valerie [name] has been followed by [facility] since her last insurance changes. The patient's primary care physician is [name] at [facility].  Valerie [name] endorsed today the geriatric depression score at 7 out of 15 points and explained that her caretaker role has been very difficult.  Her fatigue is increased at 38/63 points, Epworth Sleepiness Scale however is endorsed at only 3 out of 24 points not indicating excessive daytime sleepiness.  She has been a exemplary compliant CPAP patient using the machine 100% of the last 30 days, no days under 4 hours, average daily use of time 9 hours and 8 minutes.  She is using BiPAP with an inspiratory pressure of 13 expiratory pressure of 9 and a residual AHI of 0.1/h which is an excellent resolution she does have some air leakage, but it does not seem to lead to erroneously calculated apneas.  Her median respiratory rate is 16 a minute.  Her minute ventilation on average 12.2.        11-02-2016 - reconsultation- Valerie [name] is a 73 y.o. female here as a referral from Dr. Ronnald Ramp / Dr Jannifer Franklin.  I have the pleasure of seeing [name] today, who was last seen 35 months ago. The patient brought her compliance data for  her BiPAP machine with her and they're excellent she has used the machine 100% of the last 30 days on average 8 hours and 42 minutes, residual AHI is 0.3. The machine is set at 13/9 cm water with breeze function.   The patient is followed by [name].  She feels that her machine makes a significant difference, her Epworth sleepiness score is 3 points while  using BiPAP, she's not excessively daytime fatigue, she endorsed the geriatric depression score at 6/15 points, basically related to her caregiver burden.   Husband had cardiac complaints , suffered from post dialysis chest pain. 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 Emergency stent placed, but now he is unable from the CAD stand point to undergo necessary knee replacement.  Her husband is dependent on hemodialysis, and she feels that it is difficult to take care of herself , when her husband and son need her so much. Her son is a 60 year old quadriparetic.        History:  09-14-2013 Valerie [name] is a 73 year old right-handed black female with a history of obesity, and obstructive sleep apnea on CPAP. The patient has report some excessive daytime drowsiness and some problems with memory and concentration. The patient has had no improvement of her memory, but she indicates that she has become intolerant of the CPAP mask, and she has not been able to  use her CPAP machine for about one month. The patient has recently had left foot surgery. MRI done recently showed some minimal small vessel disease. The patient reports that in March of 2014, she had sudden onset of left-sided sensory changes with numbness, and she is also had some discomfort in the same distribution. The patient went to the emergency room, and MRI evaluation of the brain did not show an acute stroke. The patient is not aspirin or Plavix. The patient has some burning sensations of the left side of the face at times.  The patient comes to this office for an evaluation. Sleep  Consult note : Valerie [name] is a mean of 73 year old African American right-handed lady patient of Dr. Floyde Parkins and Dr. Regis Bill,  [name]  The patient has a past medical history of morbid obesity and sleep apnea for which Dr. Jannifer Franklin referred her.  She reported increasing fatigue and excessive daytime sleepiness .  Dr. Jannifer Franklin had evaluated her memory complaints and found small vessel disease to a mild extent on a CAT scan.  The patient was supposed to see Dr. Jannifer Franklin in February  2014. I have seen Valerie [name] in that MD December 2013, for a sleep consult proceeding the study. The following PSG revealed a very high AHI of 76.1 she owns a BiPAP that was set at 17/11 cm water for a sleep study also short severe oxygen desaturations at night. At worst is today points on a, so she is no longer excessively daytime sleepy but if her fatigue severity is and/or step 63 points, the maximum. The patient is titration showed that she had at the very best response to 13 cm water over 8 cm in a BiPAP function the patient had failed a nasal mask before , thus  preferred to the full face mask. At pulse oximetry done on BiPAP revealed hypoxemia, which was  not corrected. The patient's revisit was scheduled for December 2014 for a compliance visit .  The patient reports that her husband complains of her ongoing snoring while on BiPAP. Valerie [name] underwent BiPAP - titration on 11-15-13 upon referral of Dr. Jannifer Franklin. We suspected that the patient may suffer from obesity hypoventilation . She brought her previous used BiPAP to the sleep laboratory, which was found to be set at 17 /11 cm water at the time the patient on this machine it had been reset to 13/8 cm water after her sleep study in 2013.  Her current  BiPAP machine is non functional, and is too old to have downloadable capacity, and also has begun to make noises and is unreliable at night seemingly switching itself off. This makes the order for a new machine  essential . The patient BMI was 44.4. Her  fatigue scale was 63 points,  the PRQR  inventory was not endorsed , the Epworth Sleepiness Scale was 2  points. Next conference measured 70.5 inches. The optimal pressure after this titration seemed to be 13/9 cm. There was still mild snoring altered on 12/8 cm water which lead to further increase in a small increment. The patient slept 89 minutes at this pressure and reached 51 minutes in rem sleep. Her oxygen nadir increased to 94%. The night however and it shortly at 3 hours 20 minutes in AM - the patient could not re-initiate  sleep throughout the night. Fragmentation of sleep had significantly improved during titration prior to that point. REM rebound was noted. We will order a new BiPAP machine for  the patient;  to be set at 13 cm water over 9 cm and a new  fullface mask ( if she still desired to uses this model in medium-size). He did humidity as ordered. The backup rate is not needed, compliance will be defined as 4 hours or more of nightly use,  the BiPAP machine is to be brought to all sleep clinic appointments.  Valerie [name] is an 73 y.o. Female disabled patient , since 2004 no longer gainfully employed. 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 Her husband is on Dialysis, which dominates her life, too.    Review of Systems: Out of a complete 14 system review, the patient complains of only the following symptoms, and all other reviewed systems are negative.  Valerie [name] endorsed today the geriatric depression score at 7 out of 15 points and explained that her caretaker role has been very difficult.  Her fatigue is increased at 38/63 points, Epworth Sleepiness Scale however is endorsed at only 3 out of 24 points not indicating excessive daytime sleepiness.  Depression. Caregiver burden.   She also takes care of her husband and a handicapped son- who can't live by himself, he is quadriplegic with seizures. .   Social History   Socioeconomic History  .  Marital status: Married    Spouse name: Not on file  . Number of children: 3  . Years of education: 11th  . Highest education level: Not on file  Occupational History  . Occupation: disabled  Social Needs  . Financial resource strain: Not hard at all  . Food insecurity    Worry: Never true    Inability: Never true  . Transportation needs    Medical: No    Non-medical: No  Tobacco Use  . Smoking status: Never Smoker  . Smokeless tobacco: Never Used  Substance and Sexual Activity  . Alcohol use: No    Alcohol/week: 0.0 standard drinks  . Drug use: No  . Sexual activity: Not Currently    Comment: not working, lives with husband, 3 sons  Lifestyle  . Physical activity    Days per week: 0 days    Minutes per session: 0 min  . Stress: Very much  Relationships  . Social Herbalist on phone: Not on file    Gets together: Not on file    Attends religious service: Not on file    Active member of club or organization: Not on file    Attends meetings of clubs or organizations: Not on file    Relationship status: Married  . Intimate partner violence    Fear of current or ex partner: Not on file    Emotionally abused: Not on file    Physically abused: Not on file    Forced sexual activity: Not on file  Other Topics Concern  . Not on file  Social History Narrative   Lives with spouse; spouse disabled and is on dialysis ; also caregiver for son who is 53 yo and is disabled.    Family History  Problem Relation Age of Onset  . Alcohol abuse Mother   . Heart attack Mother   . Coronary artery disease Brother   . Heart attack Father   . Heart disease Sister   . Atrial fibrillation Sister   . Hypertension Sister   . Hypertension Other        family history  . Alcohol abuse Other     Past Medical History:  Diagnosis  Date  . Anemia   . Anxiety and depression   . Asthma   . Degenerative arthritis   . Depression   . Diabetes mellitus, type 2 ([place])   . Fatigue    . GERD (gastroesophageal reflux disease)   . Glaucoma   . Hemorrhoids   . Hyperlipidemia   . Hypertension   . Hypothyroid   . Hypoxemia 11/24/2013  . Memory deficit 10/04/2013  . Obesity   . Sleep apnea   . Snoring disorder     Past Surgical History:  Procedure Laterality Date  . benign tumors resected    . CATARACT EXTRACTION Left   . FOOT SURGERY Left    bone spur  . REFRACTIVE SURGERY     Glaucoma  . ROTATOR CUFF REPAIR    . TUBAL LIGATION      Current Outpatient Medications  Medication Sig Dispense Refill  . albuterol (PROVENTIL) (2.5 MG/3ML) 0.083% nebulizer solution     . albuterol (VENTOLIN HFA) 108 (90 Base) MCG/ACT inhaler Inhale 2 puffs into the lungs every 6 (six) hours as needed. For shortness of breath 3 Inhaler 4  . aspirin 81 MG tablet Take 81 mg by mouth daily.    [name] atorvastatin (LIPITOR) 20 MG tablet Take 1 tablet (20 mg total) by mouth daily. 90 tablet 1  . carvedilol (COREG) 25 MG tablet TAKE 1 TABLET BY MOUTH TWICE DAILY WITH MEALS 180 tablet 0  . chlorthalidone (HYGROTON) 25 MG tablet Take 1 tablet (25 mg total) by mouth daily. 90 tablet 1  . cyanocobalamin 2000 MCG tablet Take 1 tablet (2,000 mcg total) by mouth daily. 90 tablet 1  . empagliflozin (JARDIANCE) 25 MG TABS tablet Take 25 mg by mouth daily. 90 tablet 1  . Ferrous Sulfate (IRON) 325 (65 Fe) MG TABS TAKE 1 TABLET BY MOUTH TWICE DAILY WITH A MEAL 180 tablet 1  . fluocinonide-emollient (LIDEX-E) 0.05 % cream Apply 1 application topically 2 (two) times daily. 120 g 1  . gabapentin (NEURONTIN) 300 MG capsule TAKE 1 CAPSULE BY MOUTH THREE TIMES DAILY 270 capsule 1  . glucose blood (FREESTYLE TEST STRIPS) test strip Use to test blood sugar 3 times a day. DX: E11.8 100 each 11  . Insulin Degludec (TRESIBA FLEXTOUCH) 200 UNIT/ML SOPN Inject 30 Units into the skin daily. 9 mL 1  . Insulin Pen Needle 31G X 5 MM MISC Use once daily with insulin 100 each 3  . irbesartan (AVAPRO) 300 MG tablet TAKE 1  TABLET BY MOUTH ONCE DAILY    . latanoprost (XALATAN) 0.005 % ophthalmic solution Place 1 drop into both eyes at bedtime.    [name] omeprazole (PRILOSEC) 40 MG capsule TAKE 1 CAPSULE BY MOUTH ONCE DAILY 90 capsule 1  . OPTIMAL-D 1.25 MG (50000 UT) capsule Take 1 capsule by mouth once a week 12 capsule 0  . oxyCODONE-acetaminophen (PERCOCET) 7.5-325 MG tablet Take 1 tablet by mouth every 6 (six) hours as needed for severe pain. 90 tablet 0  . potassium chloride SA (K-DUR,KLOR-CON) 20 MEQ tablet TAKE 1 TABLET BY MOUTH TWICE DAILY 180 tablet 1  . SitaGLIPtin-MetFORMIN HCl (JANUMET XR) [phone] MG TB24 Take 1 tablet by mouth daily. 90 tablet 1  . thiamine (VITAMIN B-1) 100 MG tablet Take 1 tablet (100 mg total) by mouth daily. 90 tablet 1   No current facility-administered medications for this visit.     Allergies as of 07/10/2019 - Review Complete 07/10/2019  Allergen Reaction Noted  . Food Swelling  05/04/2012  . Penicillins Swelling and Rash     Vitals: BP 128/79   Pulse 78   Temp 98.4 F (36.9 C)   Ht 5\' 6"  (1.676 m)   Wt 256 lb (116.1 kg)   BMI 41.32 kg/m  Last Weight:  Wt Readings from Last 1 Encounters:  07/10/19 256 lb (116.1 kg)   Last Height:   Ht Readings from Last 1 Encounters:  07/10/19 5\' 6"  (1.676 m)     Physical exam:  General: The patient is awake, alert and appears not in acute distress. The patient is well groomed. Head: Normocephalic, atraumatic. Neck is supple. Mallampati 3, neck circumference:17,5. Cardiovascular:  Regular rate and rhythm, without  murmurs or carotid bruit, and without distended neck veins. Respiratory: Lungs are clear to auscultation. Skin:  Without evidence of edema, or rash Trunk: BMI  Elevated -, morbidly obese. Mental Status: Alert, oriented, thought content appropriate.  Speech fluent without evidence of aphasia. Able to follow 3 step commands without difficulty. Cranial Nerves:  Right eye cataract, Glaucoma in the left. Left eye  status post cataract.  Preserved taste and smell sense, equal pupils, facial symmetry and  facial sensory ,  full visual field for the left, she is in the process of being further worked up by [employer]	[place] and ophthalmology.    Past Medical History:  Diagnosis Date  . Anemia   . Anxiety and depression   . Asthma   . Degenerative arthritis   . Depression   . Diabetes mellitus, type 2 ([place])   . Fatigue   . GERD (gastroesophageal reflux disease)   . Glaucoma   . Hemorrhoids   . Hyperlipidemia   . Hypertension   . Hypothyroid   . Hypoxemia 11/24/2013  . Memory deficit 10/04/2013  . Obesity   . Sleep apnea   . Snoring disorder     Past Surgical History:  Procedure Laterality Date  . benign tumors resected    . CATARACT EXTRACTION Left   . FOOT SURGERY Left    bone spur  . REFRACTIVE SURGERY     Glaucoma  . ROTATOR CUFF REPAIR    . TUBAL LIGATION      Family History  Problem Relation Age of Onset  . Alcohol abuse Mother   . Heart attack Mother   . Coronary artery disease Brother   . Heart attack Father   . Heart disease Sister   . Atrial fibrillation Sister   . Hypertension Sister   . Hypertension Other        family history  . Alcohol abuse Other     Social history:  reports that she has never smoked. She has never used smokeless tobacco. She reports that she does not drink alcohol or use drugs.    Allergies  Allergen Reactions  . Food Swelling    bananas  . Penicillins Swelling and Rash    Medications:  Current Outpatient Medications on File Prior to Visit  Medication Sig Dispense Refill  . albuterol (PROVENTIL) (2.5 MG/3ML) 0.083% nebulizer solution     . albuterol (VENTOLIN HFA) 108 (90 Base) MCG/ACT inhaler Inhale 2 puffs into the lungs every 6 (six) hours as needed. For shortness of breath 3 Inhaler 4  . aspirin 81 MG tablet Take 81 mg by mouth daily.    [name] atorvastatin (LIPITOR) 20 MG tablet Take 1 tablet (20 mg total) by mouth daily. 90 tablet 1  .  carvedilol (COREG) 25 MG tablet TAKE 1 TABLET BY MOUTH  TWICE DAILY WITH MEALS 180 tablet 0  . chlorthalidone (HYGROTON) 25 MG tablet Take 1 tablet (25 mg total) by mouth daily. 90 tablet 1  . cyanocobalamin 2000 MCG tablet Take 1 tablet (2,000 mcg total) by mouth daily. 90 tablet 1  . empagliflozin (JARDIANCE) 25 MG TABS tablet Take 25 mg by mouth daily. 90 tablet 1  . Ferrous Sulfate (IRON) 325 (65 Fe) MG TABS TAKE 1 TABLET BY MOUTH TWICE DAILY WITH A MEAL 180 tablet 1  . fluocinonide-emollient (LIDEX-E) 0.05 % cream Apply 1 application topically 2 (two) times daily. 120 g 1  . gabapentin (NEURONTIN) 300 MG capsule TAKE 1 CAPSULE BY MOUTH THREE TIMES DAILY 270 capsule 1  . glucose blood (FREESTYLE TEST STRIPS) test strip Use to test blood sugar 3 times a day. DX: E11.8 100 each 11  . Insulin Degludec (TRESIBA FLEXTOUCH) 200 UNIT/ML SOPN Inject 30 Units into the skin daily. 9 mL 1  . Insulin Pen Needle 31G X 5 MM MISC Use once daily with insulin 100 each 3  . irbesartan (AVAPRO) 300 MG tablet TAKE 1 TABLET BY MOUTH ONCE DAILY    . latanoprost (XALATAN) 0.005 % ophthalmic solution Place 1 drop into both eyes at bedtime.    [name] omeprazole (PRILOSEC) 40 MG capsule TAKE 1 CAPSULE BY MOUTH ONCE DAILY 90 capsule 1  . OPTIMAL-D 1.25 MG (50000 UT) capsule Take 1 capsule by mouth once a week 12 capsule 0  . oxyCODONE-acetaminophen (PERCOCET) 7.5-325 MG tablet Take 1 tablet by mouth every 6 (six) hours as needed for severe pain. 90 tablet 0  . potassium chloride SA (K-DUR,KLOR-CON) 20 MEQ tablet TAKE 1 TABLET BY MOUTH TWICE DAILY 180 tablet 1  . SitaGLIPtin-MetFORMIN HCl (JANUMET XR) [phone] MG TB24 Take 1 tablet by mouth daily. 90 tablet 1  . thiamine (VITAMIN B-1) 100 MG tablet Take 1 tablet (100 mg total) by mouth daily. 90 tablet 1   No current facility-administered medications on file prior to visit.    Valerie [name] is a compliant user of her BiPAP machine, she had a treatment emergent central apnea  diagnosis, her baseline study had shown obstructive sleep apnea mainly. She can only tolerate BiPAP therapy and likes the current settings. She's 100% compliant I will see her from now on once a year. Patient used a new [employer]age full face mask in medium size when I had seen her last time, now she is using a AIRFIT FFM 20 medium, with memory foam.    Will send order for new supplies now from [place], DME.  The BiPAP machine continues at 13 over 9 cm water , and will follow with NP once a year.  PS : Marian Sorrow, MD  to help with needed in home healthcare for the patient's husband. He is PCP.     Primary Neurologist is Dr Floyde Parkins, MD , who follows for memory disorder.   [facility] [address] [place], [place] [address]  Phone [phone] Fax [phone]

## 2019-07-14 ENCOUNTER — Telehealth: Payer: Self-pay | Admitting: *Deleted

## 2019-07-14 NOTE — Telephone Encounter (Signed)

## 2019-07-19 ENCOUNTER — Other Ambulatory Visit: Payer: Self-pay

## 2019-07-19 ENCOUNTER — Telehealth: Payer: Self-pay

## 2019-07-19 ENCOUNTER — Telehealth (INDEPENDENT_AMBULATORY_CARE_PROVIDER_SITE_OTHER): Payer: HMO | Admitting: Cardiovascular Disease

## 2019-07-19 ENCOUNTER — Encounter: Payer: Self-pay | Admitting: Cardiovascular Disease

## 2019-07-19 DIAGNOSIS — Z6841 Body Mass Index (BMI) 40.0 and over, adult: Secondary | ICD-10-CM

## 2019-07-19 DIAGNOSIS — E785 Hyperlipidemia, unspecified: Secondary | ICD-10-CM

## 2019-07-19 DIAGNOSIS — E782 Mixed hyperlipidemia: Secondary | ICD-10-CM

## 2019-07-19 DIAGNOSIS — I10 Essential (primary) hypertension: Secondary | ICD-10-CM

## 2019-07-19 DIAGNOSIS — G4733 Obstructive sleep apnea (adult) (pediatric): Secondary | ICD-10-CM

## 2019-07-19 NOTE — Telephone Encounter (Signed)
Patient and/or DPR-approved person aware of 8/5 AVS instructions and verbalized understanding.  Letter including After Visit Summary and any other necessary documents to be mailed to the patient's address on file.  

## 2019-07-19 NOTE — Patient Instructions (Signed)
Medication Instructions:  Your physician recommends that you continue on your current medications as directed. Please refer to the Current Medication list given to you today.  If you need a refill on your cardiac medications before your next appointment, please call your pharmacy.   Lab work: none If you have labs (blood work) drawn today and your tests are completely normal, you will receive your results only by: [name] MyChart Message (if you have MyChart) OR . A paper copy in the mail If you have any lab test that is abnormal or we need to change your treatment, we will call you to review the results.  Testing/Procedures: none  Follow-Up: At [facility], you and your health needs are our priority.  As part of our continuing mission to provide you with exceptional heart care, we have created designated Provider Care Teams.  These Care Teams include your primary Cardiologist (physician) and Advanced Practice Providers (APPs -  Physician Assistants and Nurse Practitioners) who all work together to provide you with the care you need, when you need it. You will need a follow up appointment in 12 months WITH DR. Gwenlyn Found.  Please call our office 2 months in advance to schedule this appointment.

## 2019-07-19 NOTE — Progress Notes (Signed)
Virtual Visit via Telephone Note   This visit type was conducted due to national recommendations for restrictions regarding the COVID-19 Pandemic (e.g. social distancing) in an effort to limit this patient's exposure and mitigate transmission in our community.  Due to her co-morbid illnesses, this patient is at least at moderate risk for complications without adequate follow up.  This format is felt to be most appropriate for this patient at this time.  The patient did not have access to video technology/had technical difficulties with video requiring transitioning to audio format only (telephone).  All issues noted in this document were discussed and addressed.  No physical exam could be performed with this format.  Please refer to the patient's chart for her  consent to telehealth for [facility]   Date:  07/19/2019   ID:  Valerie [name], DOB [birth year], MRN [id number]  Patient Location: Home Provider Location: Home  PCP:  Janith Lima, MD  Cardiologist: Dr. Quay Burow Electrophysiologist:  None   Evaluation Performed:  Follow-Up Visit  Chief Complaint: Follow-up hypertension, hyperlipidemia and palpitations  History of Present Illness:    Valerie [name] is a 73 y.o. morbidly overweight married African-American female mother 11, grandmother of 5 grandchildren who was in the [place] and was referred by [name] for cardiovascular evaluation because of palpitations.  I last saw her in the office 09/21/2018. She has been evaluated by Drs. Hochrein and Oval Linsey in the past.  She saw Dr. Oval Linsey a little over 3 years ago which time she had an echo that was essentially normal and a 2-day protocol Myoview that was normal as well.  She does have a history of treated hypertension, diabetes and hyperlipidemia.  She wears BiPAP for obstructive sleep apnea and his family history for heart disease with both father and brother who died of myocardial infarctions.  He is never  had a heart attack or stroke.  She does get chronic chest pain every several days and is chronically short of breath.  She also has reactive airways disease.  He started having palpitations several months ago and these occur several times a week lasting seconds to minutes at a time.  Since I saw her almost a year ago she did have a Myoview stress test and 2D echo all of which were normal.  She also had a monitor placed because of palpitations 10/03/2018 that showed sinus rhythm with occasional PVCs and PACs.  Her palpitations have resolved since I last saw her.  She denies chest pain or shortness of breath.  The patient does not have symptoms concerning for COVID-19 infection (fever, chills, cough, or new shortness of breath).    Past Medical History:  Diagnosis Date  . Anemia   . Anxiety and depression   . Asthma   . Degenerative arthritis   . Depression   . Diabetes mellitus, type 2 ([place])   . Fatigue   . GERD (gastroesophageal reflux disease)   . Glaucoma   . Hemorrhoids   . Hyperlipidemia   . Hypertension   . Hypothyroid   . Hypoxemia 11/24/2013  . Memory deficit 10/04/2013  . Obesity   . Sleep apnea   . Snoring disorder    Past Surgical History:  Procedure Laterality Date  . benign tumors resected    . CATARACT EXTRACTION Left   . FOOT SURGERY Left    bone spur  . REFRACTIVE SURGERY     Glaucoma  . ROTATOR CUFF REPAIR    .  TUBAL LIGATION       Current Meds  Medication Sig  . albuterol (PROVENTIL) (2.5 MG/3ML) 0.083% nebulizer solution   . albuterol (VENTOLIN HFA) 108 (90 Base) MCG/ACT inhaler Inhale 2 puffs into the lungs every 6 (six) hours as needed. For shortness of breath  . aspirin 81 MG tablet Take 81 mg by mouth daily.  [name] atorvastatin (LIPITOR) 20 MG tablet Take 1 tablet (20 mg total) by mouth daily.  . carvedilol (COREG) 25 MG tablet TAKE 1 TABLET BY MOUTH TWICE DAILY WITH MEALS  . chlorthalidone (HYGROTON) 25 MG tablet Take 1 tablet (25 mg total) by mouth  daily.  . cyanocobalamin 2000 MCG tablet Take 1 tablet (2,000 mcg total) by mouth daily.  . empagliflozin (JARDIANCE) 25 MG TABS tablet Take 25 mg by mouth daily.  . Ferrous Sulfate (IRON) 325 (65 Fe) MG TABS TAKE 1 TABLET BY MOUTH TWICE DAILY WITH A MEAL  . fluocinonide-emollient (LIDEX-E) 0.05 % cream Apply 1 application topically 2 (two) times daily.  [name] gabapentin (NEURONTIN) 300 MG capsule TAKE 1 CAPSULE BY MOUTH THREE TIMES DAILY  . glucose blood (FREESTYLE TEST STRIPS) test strip Use to test blood sugar 3 times a day. DX: E11.8  . Insulin Degludec (TRESIBA FLEXTOUCH) 200 UNIT/ML SOPN Inject 30 Units into the skin daily.  . Insulin Pen Needle 31G X 5 MM MISC Use once daily with insulin  . irbesartan (AVAPRO) 300 MG tablet TAKE 1 TABLET BY MOUTH ONCE DAILY  . latanoprost (XALATAN) 0.005 % ophthalmic solution Place 1 drop into both eyes at bedtime.  [name] omeprazole (PRILOSEC) 40 MG capsule TAKE 1 CAPSULE BY MOUTH ONCE DAILY  . OPTIMAL-D 1.25 MG (50000 UT) capsule Take 1 capsule by mouth once a week  . oxyCODONE-acetaminophen (PERCOCET) 7.5-325 MG tablet Take 1 tablet by mouth every 6 (six) hours as needed for severe pain.  . potassium chloride SA (K-DUR,KLOR-CON) 20 MEQ tablet TAKE 1 TABLET BY MOUTH TWICE DAILY  . SitaGLIPtin-MetFORMIN HCl (JANUMET XR) [phone] MG TB24 Take 1 tablet by mouth daily.  [name] thiamine (VITAMIN B-1) 100 MG tablet Take 1 tablet (100 mg total) by mouth daily.     Allergies:   Food and Penicillins   Social History   Tobacco Use  . Smoking status: Never Smoker  . Smokeless tobacco: Never Used  Substance Use Topics  . Alcohol use: No    Alcohol/week: 0.0 standard drinks  . Drug use: No     Family Hx: The patient's family history includes Alcohol abuse in her mother and another family member; Atrial fibrillation in her sister; Coronary artery disease in her brother; Heart attack in her father and mother; Heart disease in her sister; Hypertension in her sister and  another family member.  ROS:   Please see the history of present illness.     All other systems reviewed and are negative.   Prior CV studies:   The following studies were reviewed today:  Myoview stress test, 2D echo cardiogram and event monitor all of which were done in October and November 2019  Labs/Other Tests and Data Reviewed:    EKG:  No ECG reviewed.  Recent Labs: 01/04/2019: ALT 17 05/04/2019: BUN 10; Creatinine, Ser 0.90; Hemoglobin 12.3; Platelets 336.0; Potassium 3.6; Sodium 138; TSH 3.17   Recent Lipid Panel Lab Results  Component Value Date/Time   CHOL 140 01/04/2019 01:41 PM   TRIG 236.0 (H) 01/04/2019 01:41 PM   HDL 45.20 01/04/2019 01:41 PM   CHOLHDL 3  01/04/2019 01:41 PM   LDLCALC 43 03/28/2014 03:11 PM   LDLDIRECT 57.0 01/04/2019 01:41 PM    Wt Readings from Last 3 Encounters:  07/19/19 255 lb (115.7 kg)  07/10/19 256 lb (116.1 kg)  05/04/19 250 lb (113.4 kg)     Objective:    Vital Signs:  BP 136/77   Pulse 80   Temp 97.8 F (36.6 C)   Ht 5\' 6"  (1.676 m)   Wt 255 lb (115.7 kg)   BMI 41.16 kg/m    VITAL SIGNS:  reviewed a complete physical exam was not performed today since this was a virtual telemedicine phone visit  ASSESSMENT & PLAN:    1. Essential hypertension- history of essential hypertension with blood pressure measured by the patient at home today of 136/77 with a pulse of 80.  She is on carvedilol and chlorthalidone 2. Obstructive sleep apnea- history of obstructive sleep apnea on BiPAP 3. Hyperlipidemia- history of hyperlipidemia on atorvastatin with lipid profile performed 01/04/2019 revealing total cholesterol 140, LDL 57 and HDL 45 4. Palpitations- history of palpitations with event monitor performed 10/03/2018 revealing predominantly sinus rhythm with occasional PACs and PVCs.  The symptoms have since resolved.  COVID-19 Education: The signs and symptoms of COVID-19 were discussed with the patient and how to seek care for  testing (follow up with PCP or arrange E-visit).  The importance of social distancing was discussed today.  Time:   Today, I have spent 3 minutes with the patient with telehealth technology discussing the above problems.     Medication Adjustments/Labs and Tests Ordered: Current medicines are reviewed at length with the patient today.  Concerns regarding medicines are outlined above.   Tests Ordered: No orders of the defined types were placed in this encounter.   Medication Changes: No orders of the defined types were placed in this encounter.   Follow Up:  In Person in 1 year(s)  Signed, Quay Burow, MD  07/19/2019 11:34 AM    [place]

## 2019-07-26 ENCOUNTER — Other Ambulatory Visit: Payer: Self-pay | Admitting: Internal Medicine

## 2019-07-26 DIAGNOSIS — E118 Type 2 diabetes mellitus with unspecified complications: Secondary | ICD-10-CM

## 2019-07-26 DIAGNOSIS — I10 Essential (primary) hypertension: Secondary | ICD-10-CM

## 2019-08-01 ENCOUNTER — Other Ambulatory Visit: Payer: Self-pay | Admitting: Pharmacy Technician

## 2019-08-01 NOTE — Patient Outreach (Signed)
[place] [facility]) Care Management  08/01/2019  Valerie [name] [birth year] [id number]    Incoming call received from patient in regards to Medicare D Open Enrollment.  Spoke to patient, HIPAA identifiers verified.  Patient was calling to inquire about Open Enrollment and how to determine which plan to choose. Informed patient that she could go to [email or web address], [facility], [facility] or just about any pharmacy and she or they could put in her medication list and she would be provided different options  from which she could choose based on her medication list. Also provided patient with  the number for the [facility] agency through [employer]. Informed patient she could call them at [phone], ext [phone] and they could set her up an appointment and help her with this as well. Patient verbalized understanding.  Meyah Corle P. Halia Franey, [place] Management [phone]

## 2019-08-02 DIAGNOSIS — G4733 Obstructive sleep apnea (adult) (pediatric): Secondary | ICD-10-CM | POA: Diagnosis not present

## 2019-08-22 ENCOUNTER — Other Ambulatory Visit: Payer: Self-pay | Admitting: Internal Medicine

## 2019-08-22 DIAGNOSIS — E559 Vitamin D deficiency, unspecified: Secondary | ICD-10-CM

## 2019-08-29 ENCOUNTER — Other Ambulatory Visit: Payer: Self-pay | Admitting: Pharmacist

## 2019-08-29 NOTE — Patient Outreach (Addendum)
[place] [facility]) Care Management  08/29/2019  Valerie [name] [birth year] [id number]  Patient was called for CSNP follow up. Just after identifying HIPAA x2, patient requested that I call her back later in the day because she was on her way to take her husband to dialysis.  Plan: Call patient later in the day.   Elayne Guerin, PharmD, BCACP [facility] Clinical Pharmacist [phone]   ADDENDUM  Called patient back. HIPAA identifiers were obtained. Patient reported she felt "ok" today. She said she was getting ready to leave to go back out and pick her husband up from dialysis.  Patient said she has several members who are sick and she has been trying to take care of them.  She said her provider's office called her to come and pick up [place] from [employer]. Patient reported picking up 2 boxes of Tresiba.  The refill reorder process was reviewed with the patient.  HgA1c- 6.8% 5/20 On statin  Atorvastatin was last filled 05/04/2019 per fill history. Patient reported she had Atorvastatin filled last week.  [facility] and verified. Pharmacist confirmed atorvastatin was filled 08/24/2019.  No new medication changes:  Medications Reviewed Today    Reviewed by Elayne Guerin, [facility] (Pharmacist) on 08/29/19 at 1510  Med List Status: <None>  Medication Order Taking? Sig Documenting Provider Last Dose Status Informant  albuterol (PROVENTIL) (2.5 MG/3ML) 0.083% nebulizer solution [id number] Yes  [provider] Taking Active            Med Note [place] [place] Apr 16, 2016  1:10 PM) Received from: External Pharmacy  albuterol (VENTOLIN HFA) 108 (90 Base) MCG/ACT inhaler [id number] Yes Inhale 2 puffs into the lungs every 6 (six) hours as needed. For shortness of breath Janith Lima, MD Taking Active   aspirin 81 MG tablet [id number] Yes Take 81 mg by mouth daily. [provider] Taking Active Self  atorvastatin (LIPITOR) 20 MG tablet [id number]  Yes Take 1 tablet (20 mg total) by mouth daily. Janith Lima, MD Taking Active   carvedilol (COREG) 25 MG tablet [id number] Yes TAKE 1 TABLET BY MOUTH TWICE DAILY WITH MEALS Janith Lima, MD Taking Active   chlorthalidone (HYGROTON) 25 MG tablet [id number] Yes TAKE 1 TABLET BY MOUTH EVERY DAY Janith Lima, MD Taking Active   cyanocobalamin 2000 MCG tablet [id number] Yes Take 1 tablet (2,000 mcg total) by mouth daily. Janith Lima, MD Taking Active   empagliflozin (JARDIANCE) 25 MG TABS tablet [id number] Yes Take 25 mg by mouth daily. Janith Lima, MD Taking Active   Ferrous Sulfate (IRON) 325 (65 Fe) MG TABS [id number] Yes TAKE 1 TABLET BY MOUTH TWICE DAILY WITH A MEAL Janith Lima, MD Taking Active   fluocinonide-emollient (LIDEX-E) 0.05 % cream [id number] Yes Apply 1 application topically 2 (two) times daily. Janith Lima, MD Taking Active   gabapentin (NEURONTIN) 300 MG capsule [id number] Yes TAKE 1 CAPSULE BY MOUTH THREE TIMES DAILY Janith Lima, MD Taking Active   glucose blood (FREESTYLE TEST STRIPS) test strip [id number] Yes Use to test blood sugar 3 times a day. DX: E11.8 Janith Lima, MD Taking Active   Insulin Degludec [facility]) 200 UNIT/ML SOPN [id number] Yes Inject 30 Units into the skin daily. Janith Lima, MD Taking Active   Insulin Pen Needle 31G X 5 MM MISC [id number] Yes Use once daily with insulin Janith Lima, MD Taking Active   irbesartan (  AVAPRO) 300 MG tablet [id number] Yes TAKE 1 TABLET BY MOUTH ONCE DAILY [provider] Taking Active   latanoprost (XALATAN) 0.005 % ophthalmic solution [id number] Yes Place 1 drop into both eyes at bedtime. [provider] Taking Active Self  omeprazole (PRILOSEC) 40 MG capsule [id number] Yes TAKE 1 CAPSULE BY MOUTH ONCE DAILY Janith Lima, MD Taking Active   OPTIMAL-D 1.25 MG (50000 UT) capsule [id number] Yes Take 1 capsule by mouth once a week Janith Lima, MD Taking Active    oxyCODONE-acetaminophen (PERCOCET) 7.5-325 MG tablet [id number] Yes Take 1 tablet by mouth every 6 (six) hours as needed for severe pain. Janith Lima, MD Taking Active   potassium chloride SA (K-DUR,KLOR-CON) 20 MEQ tablet [id number] Yes TAKE 1 TABLET BY MOUTH TWICE DAILY Janith Lima, MD Taking Active   SitaGLIPtin-MetFORMIN HCl (JANUMET XR) [phone] MG TB24 [id number] Yes Take 1 tablet by mouth daily. Janith Lima, MD Taking Active   thiamine (VITAMIN B-1) 100 MG tablet [id number] Yes Take 1 tablet (100 mg total) by mouth daily. Janith Lima, MD Taking Active          Plan:  Follow up with patient in 3 months.   Elayne Guerin, PharmD, [place] Clinical Pharmacist [phone]

## 2019-08-31 ENCOUNTER — Other Ambulatory Visit: Payer: Self-pay | Admitting: Internal Medicine

## 2019-09-02 DIAGNOSIS — G4733 Obstructive sleep apnea (adult) (pediatric): Secondary | ICD-10-CM | POA: Diagnosis not present

## 2019-09-05 ENCOUNTER — Ambulatory Visit: Payer: Self-pay

## 2019-09-06 NOTE — Telephone Encounter (Signed)
This encounter was created in error - please disregard.

## 2019-09-07 DIAGNOSIS — G479 Sleep disorder, unspecified: Secondary | ICD-10-CM | POA: Diagnosis not present

## 2019-09-07 DIAGNOSIS — G4733 Obstructive sleep apnea (adult) (pediatric): Secondary | ICD-10-CM | POA: Diagnosis not present

## 2019-09-13 ENCOUNTER — Encounter: Payer: Self-pay | Admitting: Internal Medicine

## 2019-09-13 ENCOUNTER — Other Ambulatory Visit: Payer: Self-pay

## 2019-09-13 ENCOUNTER — Other Ambulatory Visit (INDEPENDENT_AMBULATORY_CARE_PROVIDER_SITE_OTHER): Payer: HMO

## 2019-09-13 ENCOUNTER — Ambulatory Visit (INDEPENDENT_AMBULATORY_CARE_PROVIDER_SITE_OTHER): Payer: HMO | Admitting: Internal Medicine

## 2019-09-13 VITALS — BP 160/88 | HR 80 | Temp 98.2°F | Resp 16 | Ht 66.0 in | Wt 259.0 lb

## 2019-09-13 DIAGNOSIS — Z23 Encounter for immunization: Secondary | ICD-10-CM | POA: Diagnosis not present

## 2019-09-13 DIAGNOSIS — I10 Essential (primary) hypertension: Secondary | ICD-10-CM

## 2019-09-13 DIAGNOSIS — E039 Hypothyroidism, unspecified: Secondary | ICD-10-CM

## 2019-09-13 DIAGNOSIS — E559 Vitamin D deficiency, unspecified: Secondary | ICD-10-CM

## 2019-09-13 DIAGNOSIS — K760 Fatty (change of) liver, not elsewhere classified: Secondary | ICD-10-CM | POA: Diagnosis not present

## 2019-09-13 DIAGNOSIS — E785 Hyperlipidemia, unspecified: Secondary | ICD-10-CM

## 2019-09-13 DIAGNOSIS — E118 Type 2 diabetes mellitus with unspecified complications: Secondary | ICD-10-CM

## 2019-09-13 DIAGNOSIS — D510 Vitamin B12 deficiency anemia due to intrinsic factor deficiency: Secondary | ICD-10-CM

## 2019-09-13 DIAGNOSIS — Z6841 Body Mass Index (BMI) 40.0 and over, adult: Secondary | ICD-10-CM

## 2019-09-13 LAB — CBC WITH DIFFERENTIAL/PLATELET
Basophils Absolute: 0.1 10*3/uL (ref 0.0–0.1)
Basophils Relative: 1 % (ref 0.0–3.0)
Eosinophils Absolute: 0.4 10*3/uL (ref 0.0–0.7)
Eosinophils Relative: 3.1 % (ref 0.0–5.0)
HCT: 37 % (ref 36.0–46.0)
Hemoglobin: 12.1 g/dL (ref 12.0–15.0)
Lymphocytes Relative: 31.1 % (ref 12.0–46.0)
Lymphs Abs: 3.6 10*3/uL (ref 0.7–4.0)
MCHC: 32.6 g/dL (ref 30.0–36.0)
MCV: 89.4 fl (ref 78.0–100.0)
Monocytes Absolute: 1 10*3/uL (ref 0.1–1.0)
Monocytes Relative: 8.4 % (ref 3.0–12.0)
Neutro Abs: 6.6 10*3/uL (ref 1.4–7.7)
Neutrophils Relative %: 56.4 % (ref 43.0–77.0)
Platelets: 347 10*3/uL (ref 150.0–400.0)
RBC: 4.14 Mil/uL (ref 3.87–5.11)
RDW: 16.1 % — ABNORMAL HIGH (ref 11.5–15.5)
WBC: 11.7 10*3/uL — ABNORMAL HIGH (ref 4.0–10.5)

## 2019-09-13 LAB — BASIC METABOLIC PANEL
BUN: 11 mg/dL (ref 6–23)
CO2: 31 mEq/L (ref 19–32)
Calcium: 10.1 mg/dL (ref 8.4–10.5)
Chloride: 98 mEq/L (ref 96–112)
Creatinine, Ser: 0.9 mg/dL (ref 0.40–1.20)
GFR: 74.57 mL/min (ref >60.00)
Glucose, Bld: 94 mg/dL (ref 70–99)
Potassium: 3.6 mEq/L (ref 3.5–5.1)
Sodium: 138 mEq/L (ref 135–145)

## 2019-09-13 LAB — HEPATIC FUNCTION PANEL
ALT: 18 U/L (ref 0–35)
AST: 16 U/L (ref 0–37)
Albumin: 4.5 g/dL (ref 3.5–5.2)
Alkaline Phosphatase: 90 U/L (ref 39–117)
Bilirubin, Direct: 0.1 mg/dL (ref 0.0–0.3)
Total Bilirubin: 0.5 mg/dL (ref 0.2–1.2)
Total Protein: 7.9 g/dL (ref 6.0–8.3)

## 2019-09-13 MED ORDER — SYNJARDY 12.5-500 MG PO TABS: 1.0000 | ORAL_TABLET | Freq: Two times a day (BID) | ORAL | 1 refills | Status: DC

## 2019-09-13 MED ORDER — SAXENDA 18 MG/3ML ~~LOC~~ SOPN: 3.0000 mg | PEN_INJECTOR | Freq: Every day | SUBCUTANEOUS | 5 refills | Status: DC

## 2019-09-13 NOTE — Patient Instructions (Signed)
Type 2 Diabetes Mellitus, Diagnosis, Adult Type 2 diabetes (type 2 diabetes mellitus) is a long-term (chronic) disease. In type 2 diabetes, one or both of these problems may be present:  The pancreas does not make enough of a hormone called insulin.  Cells in the body do not respond properly to insulin that the body makes (insulin resistance). Normally, insulin allows blood sugar (glucose) to enter cells in the body. The cells use glucose for energy. Insulin resistance or lack of insulin causes excess glucose to build up in the blood instead of going into cells. As a result, high blood glucose (hyperglycemia) develops. What increases the risk? The following factors may make you more likely to develop type 2 diabetes:  Having a family member with type 2 diabetes.  Being overweight or obese.  Having an inactive (sedentary) lifestyle.  Having been diagnosed with insulin resistance.  Having a history of prediabetes, gestational diabetes, or polycystic ovary syndrome (PCOS).  Being of American-Indian, African-American, Hispanic/Latino, or Asian/Pacific Islander descent. What are the signs or symptoms? In the early stage of this condition, you may not have symptoms. Symptoms develop slowly and may include:  Increased thirst (polydipsia).  Increased hunger(polyphagia).  Increased urination (polyuria).  Increased urination during the night (nocturia).  Unexplained weight loss.  Frequent infections that keep coming back (recurring).  Fatigue.  Weakness.  Vision changes, such as blurry vision.  Cuts or bruises that are slow to heal.  Tingling or numbness in the hands or feet.  Dark patches on the skin (acanthosis nigricans). How is this diagnosed? This condition is diagnosed based on your symptoms, your medical history, a physical exam, and your blood glucose level. Your blood glucose may be checked with one or more of the following blood tests:  A fasting blood glucose (FBG)  test. You will not be allowed to eat (you will fast) for 8 hours or longer before a blood sample is taken.  A random blood glucose test. This test checks blood glucose at any time of day regardless of when you ate.  An A1c (hemoglobin A1c) blood test. This test provides information about blood glucose control over the previous 2-3 months.  An oral glucose tolerance test (OGTT). This test measures your blood glucose at two times: ? After fasting. This is your baseline blood glucose level. ? Two hours after drinking a beverage that contains glucose. You may be diagnosed with type 2 diabetes if:  Your FBG level is 126 mg/dL (7.0 mmol/L) or higher.  Your random blood glucose level is 200 mg/dL (11.1 mmol/L) or higher.  Your A1c level is 6.5% or higher.  Your OGTT result is higher than 200 mg/dL (11.1 mmol/L). These blood tests may be repeated to confirm your diagnosis. How is this treated? Your treatment may be managed by a specialist called an endocrinologist. Type 2 diabetes may be treated by following instructions from your health care provider about:  Making diet and lifestyle changes. This may include: ? Following an individualized nutrition plan that is developed by a diet and nutrition specialist (registered dietitian). ? Exercising regularly. ? Finding ways to manage stress.  Checking your blood glucose level as often as told.  Taking diabetes medicines or insulin daily. This helps to keep your blood glucose levels in the healthy range. ? If you use insulin, you may need to adjust the dosage depending on how physically active you are and what foods you eat. Your health care provider will tell you how to adjust your dosage.    Taking medicines to help prevent complications from diabetes, such as: ? Aspirin. ? Medicine to lower cholesterol. ? Medicine to control blood pressure. Your health care provider will set individualized treatment goals for you. Your goals will be based on  your age, other medical conditions you have, and how you respond to diabetes treatment. Generally, the goal of treatment is to maintain the following blood glucose levels:  Before meals (preprandial): 80-130 mg/dL (4.4-7.2 mmol/L).  After meals (postprandial): below 180 mg/dL (10 mmol/L).  A1c level: less than 7%. Follow these instructions at home: Questions to ask your health care provider  Consider asking the following questions: ? Do I need to meet with a diabetes educator? ? Where can I find a support group for people with diabetes? ? What equipment will I need to manage my diabetes at home? ? What diabetes medicines do I need, and when should I take them? ? How often do I need to check my blood glucose? ? What number can I call if I have questions? ? When is my next appointment? General instructions  Take over-the-counter and prescription medicines only as told by your health care provider.  Keep all follow-up visits as told by your health care provider. This is important.  For more information about diabetes, visit: ? American Diabetes Association (ADA): www.diabetes.org ? American Association of Diabetes Educators (AADE): www.diabeteseducator.org Contact a health care provider if:  Your blood glucose is at or above 240 mg/dL (13.3 mmol/L) for 2 days in a row.  You have been sick or have had a fever for 2 days or longer, and you are not getting better.  You have any of the following problems for more than 6 hours: ? You cannot eat or drink. ? You have nausea and vomiting. ? You have diarrhea. Get help right away if:  Your blood glucose is lower than 54 mg/dL (3.0 mmol/L).  You become confused or you have trouble thinking clearly.  You have difficulty breathing.  You have moderate or large ketone levels in your urine. Summary  Type 2 diabetes (type 2 diabetes mellitus) is a long-term (chronic) disease. In type 2 diabetes, the pancreas does not make enough of a  hormone called insulin, or cells in the body do not respond properly to insulin that the body makes (insulin resistance).  This condition is treated by making diet and lifestyle changes and taking diabetes medicines or insulin.  Your health care provider will set individualized treatment goals for you. Your goals will be based on your age, other medical conditions you have, and how you respond to diabetes treatment.  Keep all follow-up visits as told by your health care provider. This is important. This information is not intended to replace advice given to you by your health care provider. Make sure you discuss any questions you have with your health care provider. Document Released: 11/30/2005 Document Revised: 01/28/2018 Document Reviewed: 01/03/2016 Elsevier Patient Education  2020 [employer]  

## 2019-09-13 NOTE — Progress Notes (Signed)
Subjective:  Patient ID: Valerie [name], female    DOB: 07-01-1946  Age: 73 y.o. MRN: [id number]  CC: Hypertension and Diabetes   HPI Valerie [name] presents for f/up - She complains of weight gain and thinks the insulin may contributing to this.  She does not monitor her blood pressure or her blood sugars.  She has not recently been taking the ARB.  She denies any episodes of chest pain, shortness of breath, polys, edema, or fatigue.  Outpatient Medications Prior to Visit  Medication Sig Dispense Refill  . albuterol (PROVENTIL) (2.5 MG/3ML) 0.083% nebulizer solution     . albuterol (VENTOLIN HFA) 108 (90 Base) MCG/ACT inhaler Inhale 2 puffs into the lungs every 6 (six) hours as needed. For shortness of breath 3 Inhaler 4  . aspirin 81 MG tablet Take 81 mg by mouth daily.    [name] atorvastatin (LIPITOR) 20 MG tablet Take 1 tablet (20 mg total) by mouth daily. 90 tablet 1  . carvedilol (COREG) 25 MG tablet TAKE 1 TABLET BY MOUTH TWICE DAILY WITH MEALS 180 tablet 0  . chlorthalidone (HYGROTON) 25 MG tablet TAKE 1 TABLET BY MOUTH EVERY DAY 90 tablet 0  . cyanocobalamin 2000 MCG tablet Take 1 tablet (2,000 mcg total) by mouth daily. 90 tablet 1  . Ferrous Sulfate (IRON) 325 (65 Fe) MG TABS TAKE 1 TABLET BY MOUTH TWICE DAILY WITH A MEAL 180 tablet 1  . fluocinonide-emollient (LIDEX-E) 0.05 % cream Apply 1 application topically 2 (two) times daily. 120 g 1  . gabapentin (NEURONTIN) 300 MG capsule TAKE 1 CAPSULE BY MOUTH THREE TIMES DAILY 270 capsule 1  . glucose blood (FREESTYLE TEST STRIPS) test strip Use to test blood sugar 3 times a day. DX: E11.8 100 each 11  . Insulin Pen Needle 31G X 5 MM MISC Use once daily with insulin 100 each 3  . irbesartan (AVAPRO) 300 MG tablet Take 1 tablet by mouth once daily 90 tablet 1  . latanoprost (XALATAN) 0.005 % ophthalmic solution Place 1 drop into both eyes at bedtime.    [name] omeprazole (PRILOSEC) 40 MG capsule TAKE 1 CAPSULE BY MOUTH ONCE DAILY 90 capsule 1   . OPTIMAL-D 1.25 MG (50000 UT) capsule Take 1 capsule by mouth once a week 12 capsule 0  . potassium chloride SA (K-DUR,KLOR-CON) 20 MEQ tablet TAKE 1 TABLET BY MOUTH TWICE DAILY 180 tablet 1  . thiamine (VITAMIN B-1) 100 MG tablet Take 1 tablet (100 mg total) by mouth daily. 90 tablet 1  . empagliflozin (JARDIANCE) 25 MG TABS tablet Take 25 mg by mouth daily. 90 tablet 1  . Insulin Degludec (TRESIBA FLEXTOUCH) 200 UNIT/ML SOPN Inject 30 Units into the skin daily. 9 mL 1  . oxyCODONE-acetaminophen (PERCOCET) 7.5-325 MG tablet Take 1 tablet by mouth every 6 (six) hours as needed for severe pain. 90 tablet 0  . SitaGLIPtin-MetFORMIN HCl (JANUMET XR) [phone] MG TB24 Take 1 tablet by mouth daily. 90 tablet 1   No facility-administered medications prior to visit.     ROS Review of Systems  Constitutional: Positive for unexpected weight change (wt gain). Negative for chills, diaphoresis and fatigue.  HENT: Negative.   Eyes: Negative.   Respiratory: Negative for cough, chest tightness, shortness of breath and wheezing.   Cardiovascular: Negative for chest pain, palpitations and leg swelling.  Gastrointestinal: Negative for abdominal pain, constipation, diarrhea, nausea and vomiting.  Endocrine: Negative.  Negative for polydipsia, polyphagia and polyuria.  Genitourinary: Negative.  Negative  for difficulty urinating.  Musculoskeletal: Negative for arthralgias and myalgias.  Skin: Negative.   Neurological: Negative for dizziness and weakness.  Hematological: Negative for adenopathy. Does not bruise/bleed easily.  Psychiatric/Behavioral: Negative.     Objective:  BP (!) 160/88 (BP Location: Left Arm, Patient Position: Sitting, Cuff Size: Large)   Pulse 80   Temp 98.2 F (36.8 C) (Oral)   Resp 16   Ht 5\' 6"  (1.676 m)   Wt 259 lb (117.5 kg)   SpO2 98%   BMI 41.80 kg/m   BP Readings from Last 3 Encounters:  09/13/19 (!) 160/88  07/19/19 136/77  07/10/19 128/79    Wt Readings from  Last 3 Encounters:  09/13/19 259 lb (117.5 kg)  07/19/19 255 lb (115.7 kg)  07/10/19 256 lb (116.1 kg)    Physical Exam Vitals signs reviewed.  Constitutional:      Appearance: Normal appearance. She is obese.  HENT:     Nose: Nose normal.     Mouth/Throat:     Mouth: Mucous membranes are moist.  Eyes:     General: No scleral icterus.    Conjunctiva/sclera: Conjunctivae normal.  Neck:     Musculoskeletal: Normal range of motion and neck supple.  Cardiovascular:     Rate and Rhythm: Normal rate and regular rhythm.     Heart sounds: No murmur.  Pulmonary:     Effort: Pulmonary effort is normal.     Breath sounds: No stridor. No wheezing, rhonchi or rales.  Abdominal:     General: Abdomen is protuberant. Bowel sounds are normal. There is no distension.     Palpations: Abdomen is soft. There is no hepatomegaly, splenomegaly or mass.     Tenderness: There is no abdominal tenderness.  Musculoskeletal: Normal range of motion.     Right lower leg: No edema.     Left lower leg: No edema.  Lymphadenopathy:     Cervical: No cervical adenopathy.  Skin:    General: Skin is warm and dry.     Coloration: Skin is not pale.  Neurological:     General: No focal deficit present.     Mental Status: She is alert.     Lab Results  Component Value Date   WBC 11.7 (H) 09/13/2019   HGB 12.1 09/13/2019   HCT 37.0 09/13/2019   PLT 347.0 09/13/2019   GLUCOSE 94 09/13/2019   CHOL 140 01/04/2019   TRIG 236.0 (H) 01/04/2019   HDL 45.20 01/04/2019   LDLDIRECT 57.0 01/04/2019   LDLCALC 43 03/28/2014   ALT 18 09/13/2019   AST 16 09/13/2019   NA 138 09/13/2019   K 3.6 09/13/2019   CL 98 09/13/2019   CREATININE 0.90 09/13/2019   BUN 11 09/13/2019   CO2 31 09/13/2019   TSH 2.06 09/13/2019   INR 1.06 08/29/2015   HGBA1C 7.2 (H) 09/13/2019   MICROALBUR 0.8 05/04/2019    No results found.  Assessment & Plan:   Boyce was seen today for hypertension and diabetes.  Diagnoses and all  orders for this visit:  Essential hypertension, benign- Her blood pressure is not adequately well controlled.  She agrees to be more compliant with her antihypertensives and to improve her lifestyle modifications. -     Basic metabolic panel; Future  Fatty liver disease, nonalcoholic- Her LFTs are normal now. -     Hepatic function panel; Future  Hypothyroidism, unspecified type -     TSH; Future  Type II diabetes mellitus with manifestations (  Lerna)- Her A1c is at 7.2%.  She complains of weight gain so I have asked her to stop using the basal insulin.  I do not think she is benefiting much from the DPP 4 inhibitor so I have asked her to upgrade to a GLP-1 agonist.  I have asked her to stay on metformin and the SGLT2 inhibitor. -     Hemoglobin A1c; Future -     Empagliflozin-metFORMIN HCl (SYNJARDY) 12.5-500 MG TABS; Take 1 tablet by mouth 2 (two) times daily.  Vitamin B12 deficiency anemia due to intrinsic factor deficiency -     CBC with Differential/Platelet; Future  Hyperlipidemia with target LDL less than 100  Need for influenza vaccination -     Flu Vaccine QUAD High Dose(Fluad)  Morbid obesity with BMI of 45.0-49.9, adult (HCC) -     Liraglutide -Weight Management (SAXENDA) 18 MG/3ML SOPN; Inject 3 mg into the skin daily.  Vitamin D deficiency disease -     VITAMIN D 25 Hydroxy (Vit-D Deficiency, Fractures); Future   I have discontinued Pamala Hurry A. Carpenito's empagliflozin, SitaGLIPtin-MetFORMIN HCl, Insulin Degludec, and oxyCODONE-acetaminophen. I am also having her start on Saxenda and Synjardy. Additionally, I am having her maintain her latanoprost, aspirin, albuterol, albuterol, cyanocobalamin, fluocinonide-emollient, potassium chloride SA, omeprazole, Insulin Pen Needle, thiamine, gabapentin, Iron, atorvastatin, glucose blood, carvedilol, chlorthalidone, Optimal-D, and irbesartan.  Meds ordered this encounter  Medications  . Liraglutide -Weight Management (SAXENDA) 18 MG/3ML  SOPN    Sig: Inject 3 mg into the skin daily.    Dispense:  5 pen    Refill:  5  . Empagliflozin-metFORMIN HCl (SYNJARDY) 12.5-500 MG TABS    Sig: Take 1 tablet by mouth 2 (two) times daily.    Dispense:  180 tablet    Refill:  1     Follow-up: Return in about 6 months (around 03/12/2020).  [name], MD

## 2019-09-14 ENCOUNTER — Other Ambulatory Visit: Payer: Self-pay | Admitting: Pharmacist

## 2019-09-14 ENCOUNTER — Other Ambulatory Visit: Payer: Self-pay

## 2019-09-14 ENCOUNTER — Other Ambulatory Visit: Payer: Self-pay | Admitting: Internal Medicine

## 2019-09-14 DIAGNOSIS — E118 Type 2 diabetes mellitus with unspecified complications: Secondary | ICD-10-CM

## 2019-09-14 LAB — VITAMIN D 25 HYDROXY (VIT D DEFICIENCY, FRACTURES): VITD: 82.13 ng/mL (ref 30.00–100.00)

## 2019-09-14 LAB — HEMOGLOBIN A1C: Hgb A1c MFr Bld: 7.2 % — ABNORMAL HIGH (ref 4.6–6.5)

## 2019-09-14 LAB — TSH: TSH: 2.06 u[IU]/mL (ref 0.35–4.50)

## 2019-09-14 MED ORDER — SYNJARDY 12.5-500 MG PO TABS: 1.0000 | ORAL_TABLET | Freq: Two times a day (BID) | ORAL | 1 refills | Status: DC

## 2019-09-14 NOTE — Patient Outreach (Signed)
[facility]) Care Management  [place] 09/14/2019  TRAN FARISH 07-14-1946 [id number]  Reason for call: patient had medications adjusted and cannot afford new medications (Saxenda and [place])  Per notes, patient had office visit with PCP yesterday. Long-acting insulin Tresiba and DPP-IV sitagliptin (Januvia) discontinued.  GLP-1 liraglutide (Saxenda) added and SGLT2 empagliflozin (Jardiance) changed to Synjardy (empagliflozin with metformin).    Patient already approved for BI (empagliflozin) and Novo Nordisk Tyler Aas - now stopped) patient assistance programs.    Provider can send in new RX for Synjardy to BI.  Kirke Shaggy is not included in medication list from [employer] patient assistance program.  Provider can consider changing to Victoza and send in new RX to [employer].   Successful call with patient.  Reviewed medication changes with patient who is agreeable that I contact PCP regarding recommendations to change from Rich --> Victoza and sending in new RXs to patient assistance program.   Plan: Will reach out to PCP and update patient once I hear back about Victoza substitution and Darliss Ridgel, PharmD, [place] [phone]

## 2019-09-14 NOTE — Patient Outreach (Signed)
[facility]) Care Management  09/14/2019  MARDA MIR [birth year] [id number]   Social work referral received from [employer], [name].  "C-SNP client- please call client to discuss available resources. 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 She needs some type of support in the home and caregiver burnout prevention. " Successful outreach to patient today.  Talked with patient about possible in-home support for her spouse.  Patient reports that she and spouse are over the income limit to qualify for Medicaid so spouse is not eligible for personal care services.  Talked with patient about respite program through [employer] [facility], however, patient stated that spouse will not be open to someone assisting in the home because "he wants me to do everything."  Talked with patient about various caregiver services/programs in the community.  Provided patient with contact information for [employer] who manages the respite program at [employer].  Informed patient that she is appropriate contact if spouse is open to in-home services. Encouraged patient to contact Ms. Scott either way because she can provide additional information about caregiver resources/programs. Closing social work case at this time but provided patient with contact information and encouraged her to call if additional needs arise.    Ronn Melena, BSW Social Worker [phone]

## 2019-09-14 NOTE — Patient Outreach (Addendum)
[facility]) Care Management Chronic Special Needs Program  09/14/2019  Name: Valerie [name] DOB: 10-Jan-1946  MRN: [id number]  Valerie [name] is enrolled in a chronic special needs plan for Diabetes. Reviewed and updated care plan.  Subjective: client reports she had office visit with primary care provider on yesterday. Client started on Saxenda and Synjardy. She reports she has samples of Saxenda. She reports she is in the donut hole and does not know if she will be able to afford the medications. She states she is awaiting a call from her pharmacy to find out. Client also reports he husband is becoming more challenging to care for. She reports she is not able to do for herself because she is doing so much for her husband. Client is receptive to receiving social work referral regarding available resources.  Goals Addressed            This Visit's Progress   . COMPLETED:  Acknowledge receipt of Advanced Directive package       Voiced receiving packet.    .  Diabetes Patient stated goal-"Loose weight" (pt-stated)   Not on track    It is important to take time to care for yourself. Find ways to carve out time for yourself. Our [facility] social worker will provide resources that offer caregiver support.  Continue previous goal to increase activity to approximately 150 minutes per week/ 20 minutes of activity per day.   Continue to follow provider recommendations.     . COMPLETED: Advanced Care Planning complete as directed by client within the next 6-9 months.       To be completed at your leisure.    . Client will report improved coping within the next 6-9 months.   No change    I have asked our social worker to call you to discuss with you. Continue goals for increased activity. Continue prayer life.  Carve out time for yourself Expectations of others-Learn to say no when appropriate.  Reduce stress.    . COMPLETED: Client will use Assistive Devices as needed and  verbalize understanding of device use       Denies any questions or issues with use of glucometer.    . Exercise 150 minutes per week (moderate activity)   Not on track    Continue to work on this goal within the area of her home.    . Maintain timely refills of diabetic medication as prescribed within the year .   On track    I have asked pharmacist to call and follow up with your regarding possible medication assistance.    . COMPLETED: Obtain Annual Eye (retinal)  Exam        Done 06/26/2019    . COMPLETED: Obtain Annual Foot Exam       Done 05/04/2019 and Per client done 09/13/2019    . COMPLETED: Obtain annual screen for micro albuminuria (urine) , nephropathy (kidney problems)       Completed 05-04-19 per chart.      RNCM discussed Covid-19 precautions. RNCM reinforced the availability of 24 hour nurse advice line; encouraged client to contact the healthcare concierge as needed and encouraged client to call RNCM as needed.  Client had question regarding open enrollment and questions the correct plan for her for next year. RNCM encouraged client to contact the healthcare concierge; her insurance broker and provided the contact information to [facility].  Plan: RNCM will send updated care plan to client, RNCM will send  updated care plan to primary care. RNCM will outreach to client per tier level. Social work referral and care coordination with assigned pharmcist, K. Luciana Axe currently involved in case.    [name], RN, MSN, [place] [phone]

## 2019-09-15 ENCOUNTER — Other Ambulatory Visit: Payer: Self-pay | Admitting: Pharmacist

## 2019-09-15 NOTE — Patient Outreach (Signed)
[facility]) Care Management  [place] 09/15/2019  Valerie [name] 01/07/1946 [id number]  Successful call to patient.  Updated patient that PCP has sent in new RX for Synjardy to [employer] so patient can receive it at no charge through patient assistance program.  Also reviewed that PCP wants patient to try Saxenda samples to see if she tolerates.  If patient wants to continue with Saxenda, she will need to call the office and then they will change to Victoza and send in new RX to [employer] patient assistance program.  Patient voiced understanding.   Plan: F/u with patient in 2 weeks   Ralene Bathe, PharmD, [place] [phone]

## 2019-09-18 ENCOUNTER — Ambulatory Visit: Payer: HMO | Admitting: Pharmacist

## 2019-09-22 ENCOUNTER — Telehealth: Payer: Self-pay | Admitting: Internal Medicine

## 2019-09-22 NOTE — Telephone Encounter (Signed)
Patient stated at last OV with PCP she asked if PCP can prescribe Oxycodone for back pain, patient checking on the status of request, please advise  Arnold ([address] [place] [phone] (Phone) [phone] (Fax)

## 2019-09-26 ENCOUNTER — Other Ambulatory Visit: Payer: Self-pay | Admitting: Pharmacy Technician

## 2019-09-26 NOTE — Patient Outreach (Signed)
[facility]) Care Management  09/26/2019  SARH MABERY 04-25-1946 [id number]   Received the following in basket message from [place]: Warden Fillers,  This is the patient I was referring to yesterday with MD needing to send in updated RXs to [facility] and [employer]. MD office HAS faxed in new RX for Synjardy to [place]. Can you please f/u on this when you have other BI calls? MD waiting to see if patient tolerates Saxenda before changing to Victoza and sending this new RX in.   Thanks,  [facility] coordination call placed to [facility] today. Spoke to Clarks. Kice informed patient was approved for the program back in May of 2020. He informed a new rx for Iva Boop was sent in on 9/24 and again on 10/2. He informed the Iva Boop was shipped out on 09/07/2019 and arrived at patient's home on 09/14/2019. Verified and confirmed X 2 that the medication we were speaking about was [place] and Verdell Carmine confirmed that it was.  Remedy Corporan P. Thoma Paulsen, [place] Management [phone]

## 2019-09-26 NOTE — Patient Outreach (Signed)
[facility]) Care Management  09/26/2019  Valerie [name] January 19, 1946 [id number]    ADDENDUM  Unsuccessful outreach call was placed to patient today in regards to the receipt of the Synjardy. Unfortunately she did not answer the phone. HIPAA compliant voicemail was left.  Note was routed to both [facility] RPh's Colleen Summe and Denyse Amass.  Valerie [name] P. Lilton Pare, [place] Management [phone]

## 2019-09-27 ENCOUNTER — Telehealth: Payer: Self-pay | Admitting: Internal Medicine

## 2019-09-27 NOTE — Telephone Encounter (Signed)
rx refill Hydrocodone-Ibuprofen (REPREXAIN) 10-200 MG TABS  PHARMACY [name] [place] [facility] [address] [phone](Phone) [phone](Fax)    Patient would like medication for her back

## 2019-09-27 NOTE — Telephone Encounter (Signed)
There is a previous note requesting rx for back pain. Pt stated it was discussed at last OV. Pt is requesting the Hydrocodone and Ibuprofen combination tablet if PCP agrees.   Please advise.

## 2019-10-02 ENCOUNTER — Ambulatory Visit: Payer: Self-pay | Admitting: Pharmacist

## 2019-10-02 ENCOUNTER — Other Ambulatory Visit: Payer: Self-pay | Admitting: Pharmacist

## 2019-10-02 DIAGNOSIS — G4733 Obstructive sleep apnea (adult) (pediatric): Secondary | ICD-10-CM | POA: Diagnosis not present

## 2019-10-02 NOTE — Patient Outreach (Signed)
[facility]) Care Management  10/02/2019  Valerie [name] 05-04-1946 [id number]   Called patient to follow up.  According to chart review, patient received Synjardy at the beginning of October.  Patient said she had not received a packaged.  [place] on the [employer]. The BI representative Ramiro Harvest), said Synjardy had NOT been sent to the patient. Jardiance had been sent and the [facility] and just been put on the patient's profile. [facility] sent the prescription to th pharmacy to be filled today and said the patient should have the Synjardy within the next 10-15 business days after a 24 hour processing.  Called patient and let her know.  Plan: Follow up with patient in 2-3 weeks for CSNP med review and to see if she received her Synjardy.   Elayne Guerin, PharmD, [place] Clinical Pharmacist [phone]

## 2019-10-05 ENCOUNTER — Telehealth: Payer: Self-pay | Admitting: Internal Medicine

## 2019-10-05 NOTE — Telephone Encounter (Signed)
Pt was told that if she runs out of Saxenda to call the office and see if they have samples. The pt will take her last dose today and will need the samples if possible / please advise

## 2019-10-05 NOTE — Telephone Encounter (Signed)
Please advise, I do not see where patient is on this med.

## 2019-10-05 NOTE — Telephone Encounter (Signed)
Do you know if we have these samples?

## 2019-10-09 NOTE — Telephone Encounter (Signed)
Pt is coming in for an appointment.

## 2019-10-10 ENCOUNTER — Ambulatory Visit (INDEPENDENT_AMBULATORY_CARE_PROVIDER_SITE_OTHER): Payer: HMO | Admitting: Internal Medicine

## 2019-10-10 ENCOUNTER — Encounter: Payer: Self-pay | Admitting: Internal Medicine

## 2019-10-10 ENCOUNTER — Other Ambulatory Visit: Payer: Self-pay

## 2019-10-10 VITALS — BP 144/86 | HR 80 | Temp 98.6°F | Resp 16 | Ht 66.0 in | Wt 254.0 lb

## 2019-10-10 DIAGNOSIS — M48061 Spinal stenosis, lumbar region without neurogenic claudication: Secondary | ICD-10-CM | POA: Diagnosis not present

## 2019-10-10 DIAGNOSIS — Z1231 Encounter for screening mammogram for malignant neoplasm of breast: Secondary | ICD-10-CM

## 2019-10-10 DIAGNOSIS — M8949 Other hypertrophic osteoarthropathy, multiple sites: Secondary | ICD-10-CM

## 2019-10-10 DIAGNOSIS — I10 Essential (primary) hypertension: Secondary | ICD-10-CM | POA: Diagnosis not present

## 2019-10-10 DIAGNOSIS — M159 Polyosteoarthritis, unspecified: Secondary | ICD-10-CM | POA: Insufficient documentation

## 2019-10-10 DIAGNOSIS — M150 Primary generalized (osteo)arthritis: Secondary | ICD-10-CM

## 2019-10-10 DIAGNOSIS — E118 Type 2 diabetes mellitus with unspecified complications: Secondary | ICD-10-CM

## 2019-10-10 MED ORDER — OXYCODONE-ACETAMINOPHEN 5-325 MG PO TABS: 1.0000 | ORAL_TABLET | Freq: Three times a day (TID) | ORAL | 0 refills | Status: DC | PRN

## 2019-10-10 MED ORDER — IRBESARTAN 150 MG PO TABS: 150.0000 mg | ORAL_TABLET | Freq: Every day | ORAL | 1 refills | Status: DC

## 2019-10-10 NOTE — Patient Instructions (Signed)

## 2019-10-10 NOTE — Progress Notes (Signed)
Subjective:  Patient ID: Valerie [name], female    DOB: June 05, 1946  Age: 73 y.o. MRN: [id number]  CC: Hypertension and Back Pain   HPI Valerie [name] presents for f/up - She had an episode of hypotension a few weeks ago that required medical attention.  She has since decreased her irbesartan dose by 50% and feels much better.  She has a history of DDD and lumbar spinal stenosis.  She controls the pain with Percocet and wants a refill.  She says the pain does not radiate into her legs and she denies lower extremity paresthesias.  She also complains of pain in her knees and ankles that interferes with her daily activities.  Outpatient Medications Prior to Visit  Medication Sig Dispense Refill  . albuterol (PROVENTIL) (2.5 MG/3ML) 0.083% nebulizer solution     . albuterol (VENTOLIN HFA) 108 (90 Base) MCG/ACT inhaler Inhale 2 puffs into the lungs every 6 (six) hours as needed. For shortness of breath 3 Inhaler 4  . aspirin 81 MG tablet Take 81 mg by mouth daily.    [name] atorvastatin (LIPITOR) 20 MG tablet Take 1 tablet (20 mg total) by mouth daily. 90 tablet 1  . carvedilol (COREG) 25 MG tablet TAKE 1 TABLET BY MOUTH TWICE DAILY WITH MEALS 180 tablet 0  . chlorthalidone (HYGROTON) 25 MG tablet TAKE 1 TABLET BY MOUTH EVERY DAY 90 tablet 0  . cyanocobalamin 2000 MCG tablet Take 1 tablet (2,000 mcg total) by mouth daily. 90 tablet 1  . Empagliflozin-metFORMIN HCl (SYNJARDY) 12.5-500 MG TABS Take 1 tablet by mouth 2 (two) times daily. 180 tablet 1  . Ferrous Sulfate (IRON) 325 (65 Fe) MG TABS TAKE 1 TABLET BY MOUTH TWICE DAILY WITH A MEAL 180 tablet 1  . fluocinonide-emollient (LIDEX-E) 0.05 % cream Apply 1 application topically 2 (two) times daily. 120 g 1  . gabapentin (NEURONTIN) 300 MG capsule TAKE 1 CAPSULE BY MOUTH THREE TIMES DAILY 270 capsule 1  . glucose blood (FREESTYLE TEST STRIPS) test strip Use to test blood sugar 3 times a day. DX: E11.8 100 each 11  . Insulin Pen Needle 31G X 5 MM MISC  Use once daily with insulin 100 each 3  . latanoprost (XALATAN) 0.005 % ophthalmic solution Place 1 drop into both eyes at bedtime.    [name] omeprazole (PRILOSEC) 40 MG capsule TAKE 1 CAPSULE BY MOUTH ONCE DAILY 90 capsule 1  . OPTIMAL-D 1.25 MG (50000 UT) capsule Take 1 capsule by mouth once a week 12 capsule 0  . potassium chloride SA (K-DUR,KLOR-CON) 20 MEQ tablet TAKE 1 TABLET BY MOUTH TWICE DAILY 180 tablet 1  . thiamine (VITAMIN B-1) 100 MG tablet Take 1 tablet (100 mg total) by mouth daily. 90 tablet 1  . irbesartan (AVAPRO) 300 MG tablet Take 1 tablet by mouth once daily 90 tablet 1   No facility-administered medications prior to visit.     ROS Review of Systems  Constitutional: Negative.  Negative for diaphoresis and fatigue.  HENT: Negative.   Eyes: Negative.   Respiratory: Negative for cough, chest tightness, shortness of breath and wheezing.   Cardiovascular: Negative for palpitations and leg swelling.  Gastrointestinal: Negative for abdominal pain, constipation, diarrhea, nausea and vomiting.  Endocrine: Negative.   Genitourinary: Negative.  Negative for difficulty urinating.  Musculoskeletal: Positive for arthralgias and back pain. Negative for myalgias.  Skin: Negative.  Negative for color change and pallor.  Neurological: Negative.  Negative for dizziness, weakness and light-headedness.  Hematological:  Negative for adenopathy. Does not bruise/bleed easily.  Psychiatric/Behavioral: Negative.     Objective:  BP (!) 144/86 (BP Location: Left Arm, Patient Position: Sitting, Cuff Size: Large)   Pulse 80   Temp 98.6 F (37 C) (Oral)   Resp 16   Ht '5\' 6"'$  (1.676 m)   Wt 254 lb (115.2 kg)   SpO2 98%   BMI 41.00 kg/m   BP Readings from Last 3 Encounters:  10/10/19 (!) 144/86  09/13/19 (!) 160/88  07/19/19 136/77    Wt Readings from Last 3 Encounters:  10/10/19 254 lb (115.2 kg)  09/13/19 259 lb (117.5 kg)  07/19/19 255 lb (115.7 kg)    Physical Exam Vitals signs  reviewed.  Constitutional:      Appearance: Normal appearance. She is obese. She is not ill-appearing.  HENT:     Nose: Nose normal.     Mouth/Throat:     Mouth: Mucous membranes are moist.  Eyes:     Conjunctiva/sclera: Conjunctivae normal.  Neck:     Musculoskeletal: Normal range of motion and neck supple.  Cardiovascular:     Rate and Rhythm: Normal rate and regular rhythm.     Heart sounds: No murmur.  Pulmonary:     Effort: Pulmonary effort is normal.     Breath sounds: No stridor. No wheezing, rhonchi or rales.  Abdominal:     General: Abdomen is protuberant. Bowel sounds are normal. There is no distension.     Palpations: There is no hepatomegaly or splenomegaly.  Musculoskeletal: Normal range of motion.     Right lower leg: No edema.     Left lower leg: No edema.  Lymphadenopathy:     Cervical: No cervical adenopathy.  Skin:    General: Skin is warm and dry.  Neurological:     General: No focal deficit present.     Mental Status: She is alert.  Psychiatric:        Mood and Affect: Mood normal.        Behavior: Behavior normal.     Lab Results  Component Value Date   WBC 11.7 (H) 09/13/2019   HGB 12.1 09/13/2019   HCT 37.0 09/13/2019   PLT 347.0 09/13/2019   GLUCOSE 94 09/13/2019   CHOL 140 01/04/2019   TRIG 236.0 (H) 01/04/2019   HDL 45.20 01/04/2019   LDLDIRECT 57.0 01/04/2019   LDLCALC 43 03/28/2014   ALT 18 09/13/2019   AST 16 09/13/2019   NA 138 09/13/2019   K 3.6 09/13/2019   CL 98 09/13/2019   CREATININE 0.90 09/13/2019   BUN 11 09/13/2019   CO2 31 09/13/2019   TSH 2.06 09/13/2019   INR 1.06 08/29/2015   HGBA1C 7.2 (H) 09/13/2019   MICROALBUR 0.8 05/04/2019    No results found.  Assessment & Plan:   Valerie [name] was seen today for hypertension and back pain.  Diagnoses and all orders for this visit:  Essential hypertension, benign- Her blood pressure is adequately well controlled.  She will decrease the irbesartan dose by 50% and will  continue the other antihypertensives. -     irbesartan (AVAPRO) 150 MG tablet; Take 1 tablet (150 mg total) by mouth daily.  Type II diabetes mellitus with manifestations ([place])- her recent A1c showed that her blood sugars are adequately well controlled. -     irbesartan (AVAPRO) 150 MG tablet; Take 1 tablet (150 mg total) by mouth daily.  Spinal stenosis of lumbar 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 -  oxyCODONE-acetaminophen (PERCOCET/ROXICET) 5-325 MG tablet; Take 1 tablet by mouth every 8 (eight) hours as needed for severe pain.  Visit for screening mammogram  Primary osteoarthritis involving multiple joints -     oxyCODONE-acetaminophen (PERCOCET/ROXICET) 5-325 MG tablet; Take 1 tablet by mouth every 8 (eight) hours as needed for severe pain.   I have discontinued Valerie [name]'s irbesartan. I am also having her start on irbesartan and oxyCODONE-acetaminophen. Additionally, I am having her maintain her latanoprost, aspirin, albuterol, albuterol, cyanocobalamin, fluocinonide-emollient, potassium chloride SA, omeprazole, Insulin Pen Needle, thiamine, gabapentin, Iron, atorvastatin, glucose blood, carvedilol, chlorthalidone, Optimal-D, and Synjardy.  Meds ordered this encounter  Medications  . irbesartan (AVAPRO) 150 MG tablet    Sig: Take 1 tablet (150 mg total) by mouth daily.    Dispense:  90 tablet    Refill:  1  . oxyCODONE-acetaminophen (PERCOCET/ROXICET) 5-325 MG tablet    Sig: Take 1 tablet by mouth every 8 (eight) hours as needed for severe pain.    Dispense:  90 tablet    Refill:  0     Follow-up: Return in about 4 months (around 02/10/2020).  [name], MD

## 2019-10-19 ENCOUNTER — Other Ambulatory Visit: Payer: Self-pay | Admitting: Pharmacy Technician

## 2019-10-19 NOTE — Patient Outreach (Signed)
[name]) Care Management  10/19/2019  Valerie [name] 05/21/1946 [id number]   ADDENDUM  Successful outgoing call placed to patient in reagards to [facility] application with BI.  Spoke to patient, HIPAA identifiers verified.  Informed patient the Iva Boop is set to be delivered Monday by [employer].  Informed patient to call me if the medication does NOT come in. Confirmed patient had name and number.  Gwendola Hornaday P. Abeeha Twist, [place] Management [phone]

## 2019-10-19 NOTE — Patient Outreach (Signed)
[place] [facility]) Care Management  10/19/2019  LESLEYANNE RIBBECK 19-Oct-1946 [id number]    Incoming call received from patient in regards to [facility] application for Synjardy.  Spoke to patient, HIPAA identifiers verified.  Patient called to inform she still has not received the [place].  Informed patient that I would outreach BI.  Revere Maahs P. Mercadez Heitman, [place] Management [phone]

## 2019-10-19 NOTE — Patient Outreach (Signed)
[place] [facility]) Care Management  10/19/2019  Valerie [name] 26-Nov-1946 [id number]  Care coordination call placed to [place] in regards to patient's Synjardy application.  Spoke to [place] who informed that jardiance was sent out on 10/03/2019.  Informed Huel Coventry that the [name] has been stopped and that we have conveyed this to 2 different representatives twice.  She confirmed having the [facility] prescription. She reached out to her supervisor and was given permission to have this shipment expedited and that it would arrive on Monday 10/23/2019. I confirmed with Huel Coventry that we were speaking about Synjardy and she said yes and that it is the 12.5mg .  Will outreach patient with this information.  Karon Cotterill P. Diem Dicocco, [place] Management [phone]

## 2019-10-26 ENCOUNTER — Other Ambulatory Visit: Payer: Self-pay | Admitting: Pharmacy Technician

## 2019-10-26 NOTE — Patient Outreach (Signed)
[place] [facility]) Care Management  10/26/2019  TANNYA SPRUILL [birth year] [id number]  Incoming call in which patient left me a voicemail message for me as I was at lunch.  Patient informed in the voicemail that she had received the Synjardy from [facility]  Patient assistance program and she wanted me to let [place] know.  Will route note to [place].  Nealy Karapetian P. Minoru Chap, [place] Management [phone]

## 2019-10-30 ENCOUNTER — Ambulatory Visit: Payer: HMO

## 2019-11-02 DIAGNOSIS — G4733 Obstructive sleep apnea (adult) (pediatric): Secondary | ICD-10-CM | POA: Diagnosis not present

## 2019-11-06 ENCOUNTER — Ambulatory Visit: Payer: HMO | Admitting: Pharmacist

## 2019-11-06 ENCOUNTER — Other Ambulatory Visit: Payer: Self-pay | Admitting: Pharmacy Technician

## 2019-11-06 NOTE — Patient Outreach (Signed)
[place] [facility]) Care Management  11/06/2019  Valerie [name] 06-20-1946 [id number]  Incoming call received from patient in regards to re enrollment question concerning 2021 patient assistance.  Spoke to patient, HIPAA identifiers verified.  Patient was calling to inquire if she would need to re enroll for her patient assistance programs in 2021. Informed patient we were in the process of attempting to outreach patients. Informed patient I would have the pharmacist evaluate her medication list for clinical appropriateness. Informed patient once I hear back from the pharmacist then I would outreach her to let her know the applications have been placed in the mail to her. Patient was agreeable to the plan and verbalized understanding.  2020 patient assistance consisted of the following which may have changed since initial approval;l [employer]ck-Proventil, Janumet BI-Jardiance, [employer] Nordisk-Tresiba  In [employer] was sent to [place].  Valerie [name], [place] Management [phone]

## 2019-11-14 ENCOUNTER — Telehealth: Payer: Self-pay | Admitting: Pharmacist

## 2019-11-14 NOTE — Patient Outreach (Signed)
[place] [facility]) Care Management  11/14/2019  Valerie [name] 02-02-1946 [id number]   Patient was called regarding medication assistance for 2021. HIPAA identifiers were obtained.    Patient's medications were reviewed.   She reported cutting the Irbesartan 300 mg tablets in half because taking the full 300 mg caused hypotension. (She said she notified [name] via chart review on the 10/10/2019 office note.  She confirmed receipt of Synjardy.(3 months supply)  HgA1c-7.2% 08/2019  Medications Reviewed Today    Reviewed by Elayne Guerin, [facility] (Pharmacist) on 11/14/19 at 1253  Med List Status: <None>  Medication Order Taking? Sig Documenting Provider Last Dose Status Informant  albuterol (PROVENTIL) (2.5 MG/3ML) 0.083% nebulizer solution [id number] Yes  [provider] Taking Active            Med Note Merceda Elks Apr 16, 2016  1:10 PM) Received from: External Pharmacy  albuterol (VENTOLIN HFA) 108 (90 Base) MCG/ACT inhaler [id number] Yes Inhale 2 puffs into the lungs every 6 (six) hours as needed. For shortness of breath Janith Lima, MD Taking Active            Med Note Elayne Guerin   Tue Aug 29, 2019  3:11 PM) Receiving Proventil from [employer]' Patient Assistance Program  aspirin 81 MG tablet [id number] Yes Take 81 mg by mouth daily. [provider] Taking Active Self  atorvastatin (LIPITOR) 20 MG tablet [id number] Yes Take 1 tablet (20 mg total) by mouth daily. Janith Lima, MD Taking Active   carvedilol (COREG) 25 MG tablet [id number] Yes TAKE 1 TABLET BY MOUTH TWICE DAILY WITH MEALS Janith Lima, MD Taking Active   chlorthalidone (HYGROTON) 25 MG tablet [id number] Yes TAKE 1 TABLET BY MOUTH EVERY DAY Janith Lima, MD Taking Active   cyanocobalamin 2000 MCG tablet [id number] Yes Take 1 tablet (2,000 mcg total) by mouth daily. Janith Lima, MD Taking Active   Empagliflozin-metFORMIN HCl [facility]) 12.5-500 MG TABS  [id number] Yes Take 1 tablet by mouth 2 (two) times daily. Janith Lima, MD Taking Active   Ferrous Sulfate (IRON) 325 (65 Fe) MG TABS [id number] Yes TAKE 1 TABLET BY MOUTH TWICE DAILY WITH A MEAL Janith Lima, MD Taking Active   fluocinonide-emollient (LIDEX-E) 0.05 % cream [id number] Yes Apply 1 application topically 2 (two) times daily. Janith Lima, MD Taking Active   gabapentin (NEURONTIN) 300 MG capsule [id number] Yes TAKE 1 CAPSULE BY MOUTH THREE TIMES DAILY Janith Lima, MD Taking Active   glucose blood (FREESTYLE TEST STRIPS) test strip [id number] Yes Use to test blood sugar 3 times a day. DX: E11.8 Janith Lima, MD Taking Active   Insulin Pen Needle 31G X 5 MM MISC [id number] Yes Use once daily with insulin Janith Lima, MD Taking Active   irbesartan (AVAPRO) 300 MG tablet [id number] Yes Take 150 mg by mouth daily.  [provider] Taking Active            Med Note Elayne Guerin   Tue Nov 14, 2019 12:52 PM) Patient reported only taking 150mg  daily  latanoprost (XALATAN) 0.005 % ophthalmic solution [id number] Yes Place 1 drop into both eyes at bedtime. [provider] Taking Active Self  omeprazole (PRILOSEC) 40 MG capsule [id number] Yes TAKE 1 CAPSULE BY MOUTH ONCE DAILY Janith Lima, MD Taking Active   OPTIMAL-D 1.25 MG (50000 UT) capsule [id number] Yes Take 1 capsule by mouth once a week Jones,  Arvid Right, MD Taking Active   oxyCODONE-acetaminophen (PERCOCET/ROXICET) 5-325 MG tablet [id number] Yes Take 1 tablet by mouth every 8 (eight) hours as needed for severe pain. Janith Lima, MD Taking Active   potassium chloride SA (K-DUR,KLOR-CON) 20 MEQ tablet [id number] Yes TAKE 1 TABLET BY MOUTH TWICE DAILY Janith Lima, MD Taking Active   thiamine (VITAMIN B-1) 100 MG tablet [id number] Yes Take 1 tablet (100 mg total) by mouth daily. Janith Lima, MD Taking Active          Plan: I will route patient assistance letter to [place] technician who will  coordinate patient assistance program application process for medications listed above.  [facility] pharmacy technician will assist with obtaining all required documents from both patient and provider(s) and submit application(s) once completed.   Follow up in 6-8 weeks.     Elayne Guerin, PharmD, [place] Clinical Pharmacist [phone]

## 2019-11-15 ENCOUNTER — Other Ambulatory Visit: Payer: Self-pay | Admitting: Internal Medicine

## 2019-11-15 DIAGNOSIS — L309 Dermatitis, unspecified: Secondary | ICD-10-CM

## 2019-11-16 ENCOUNTER — Other Ambulatory Visit: Payer: Self-pay | Admitting: Pharmacy Technician

## 2019-11-16 NOTE — Patient Outreach (Signed)
[place] [facility]) Care Management  11/16/2019  MAYMIE VORNDRAN [birth year] [id number]                                        Medication Assistance Referral  Referral From: [place]  Medication/Company: Proventil HFA / [employer] Patient application portion:  Education officer, museum portion: Interoffice Mailed to [name] Provider address/fax verified via: Office website  Medication/Company: Iva Boop / BI Patient application portion:  Mailed Provider application portion: Faxed  to [name] Provider address/fax verified via: Office website  FOR 2021 Patient Assistance   Follow up:  Will follow up with patient in 15-30  days to confirm application(s) have been received.  Cherish Runde P. Edwyna Dangerfield, [place] Management [phone]

## 2019-11-19 ENCOUNTER — Other Ambulatory Visit: Payer: Self-pay | Admitting: Internal Medicine

## 2019-11-19 DIAGNOSIS — D509 Iron deficiency anemia, unspecified: Secondary | ICD-10-CM

## 2019-11-19 DIAGNOSIS — E785 Hyperlipidemia, unspecified: Secondary | ICD-10-CM

## 2019-11-19 DIAGNOSIS — E118 Type 2 diabetes mellitus with unspecified complications: Secondary | ICD-10-CM

## 2019-11-19 DIAGNOSIS — I10 Essential (primary) hypertension: Secondary | ICD-10-CM

## 2019-11-20 ENCOUNTER — Ambulatory Visit: Payer: PPO | Admitting: Adult Health

## 2019-11-21 ENCOUNTER — Other Ambulatory Visit: Payer: Self-pay | Admitting: Internal Medicine

## 2019-11-23 ENCOUNTER — Other Ambulatory Visit: Payer: Self-pay | Admitting: Internal Medicine

## 2019-11-23 DIAGNOSIS — E519 Thiamine deficiency, unspecified: Secondary | ICD-10-CM

## 2019-12-02 DIAGNOSIS — G4733 Obstructive sleep apnea (adult) (pediatric): Secondary | ICD-10-CM | POA: Diagnosis not present

## 2019-12-10 DIAGNOSIS — G4733 Obstructive sleep apnea (adult) (pediatric): Secondary | ICD-10-CM | POA: Diagnosis not present

## 2019-12-10 DIAGNOSIS — G479 Sleep disorder, unspecified: Secondary | ICD-10-CM | POA: Diagnosis not present

## 2019-12-12 ENCOUNTER — Other Ambulatory Visit: Payer: Self-pay | Admitting: Pharmacy Technician

## 2019-12-12 NOTE — Patient Outreach (Signed)
[facility]) Care Management  12/12/2019  CHIEKO PROSS 12/13/1946 [id number]    Received both patient and provider portion(s) of patient assistance application(s) for Proventil and Synjardy. Mailed completed application and required documents into [employer] for Proventil and faxed completed application and required documents into BI for [place].  Will follow up with company(ies) in 10-14 business days to check status of application(s).  Myonna Chisom P. Aryssa Rosamond, [place] Management [phone]

## 2019-12-25 ENCOUNTER — Other Ambulatory Visit: Payer: Self-pay | Admitting: Pharmacy Technician

## 2019-12-25 NOTE — Patient Outreach (Signed)
[facility]) Care Management  12/25/2019  Valerie [name] 1946/04/04 [id number]   Care coordination call placed to [place] in regards to [facility] application.  Spoke to [place] who informed patient was APPROVED 12/15/2019-12/13/2020. She informed patient's refill could be processed today. She informed patient should receive medication in the next 7-10 business days.  Will outreach patient in 10-14 business days to inquire if medication was received.  Draden Cottingham P. Amenah Tucci, [place] Management [phone]

## 2019-12-27 ENCOUNTER — Other Ambulatory Visit: Payer: Self-pay | Admitting: Internal Medicine

## 2019-12-27 DIAGNOSIS — E876 Hypokalemia: Secondary | ICD-10-CM

## 2019-12-27 DIAGNOSIS — E559 Vitamin D deficiency, unspecified: Secondary | ICD-10-CM

## 2019-12-27 DIAGNOSIS — I10 Essential (primary) hypertension: Secondary | ICD-10-CM

## 2019-12-28 ENCOUNTER — Ambulatory Visit: Payer: Self-pay

## 2019-12-29 ENCOUNTER — Other Ambulatory Visit: Payer: Self-pay | Admitting: Pharmacy Technician

## 2019-12-29 NOTE — Patient Outreach (Signed)
[place] [facility]) Care Management  12/29/2019  Valerie [name] 07-27-1946 [id number]   Care coordination call placed to [employer] in regards to patient's Proventil HFA application.  Spoke to [facility] who informed they have not received the application that was mailed to them on 12/12/2019. [name] informed they are running behind on process applications due to delay within the USPS as well as the holidays.  Will follow up with [employer] in 15-20 business days.

## 2020-01-02 ENCOUNTER — Ambulatory Visit: Payer: Self-pay | Admitting: Pharmacist

## 2020-01-02 ENCOUNTER — Other Ambulatory Visit: Payer: Self-pay | Admitting: Pharmacist

## 2020-01-02 DIAGNOSIS — G4733 Obstructive sleep apnea (adult) (pediatric): Secondary | ICD-10-CM | POA: Diagnosis not present

## 2020-01-03 NOTE — Patient Outreach (Signed)
[place] [facility]) Care Management  01/03/2020  Valerie [name] 30-Sep-1946 [id number]   Patient was called to follow up on medication assistance. HIPAA identifiers were obtained. Patient is a 74 year old female with multiple medical conditions including but not limited to: depression, hypertension, vitamin B12 deficiency, GERD,  Hyperlipidemia, hypothyroidism, Insomnia, obstructive sleep apnea, osteopenia, osteoarthritis, type 2 diabetes, and vitamin D deficiency.  [facility] helped patient with patient assistance with Synjardy and Proventil HFA.  She was approved for Synjardy through the end of 2021. The [employer] application was submitted on her behalf at the end of last year.    Patient's medications were reviewed:  Medications Reviewed Today    Reviewed by Elayne Guerin, [facility] (Pharmacist) on 01/02/20 at 1102  Med List Status: <None>  Medication Order Taking? Sig Documenting Provider Last Dose Status Informant  albuterol (PROVENTIL) (2.5 MG/3ML) 0.083% nebulizer solution [id number] Yes  [provider] Taking Active            Med Note Merceda Elks Apr 16, 2016  1:10 PM) Received from: External Pharmacy  albuterol (VENTOLIN HFA) 108 (90 Base) MCG/ACT inhaler [id number] Yes Inhale 2 puffs into the lungs every 6 (six) hours as needed. For shortness of breath Janith Lima, MD Taking Active            Med Note Elayne Guerin   Tue Aug 29, 2019  3:11 PM) Receiving Proventil from [employer]' Patient Assistance Program  aspirin 81 MG tablet [id number] Yes Take 81 mg by mouth daily. [provider] Taking Active Self  atorvastatin (LIPITOR) 20 MG tablet [id number] Yes TAKE 1 TABLET BY MOUTH EVERY DAY Janith Lima, MD Taking Active   carvedilol (COREG) 25 MG tablet [id number] Yes TAKE 1 TABLET BY MOUTH TWICE DAILY WITH MEALS Janith Lima, MD Taking Active   chlorthalidone (HYGROTON) 25 MG tablet [id number] Yes TAKE 1 TABLET BY MOUTH EVERY DAY Janith Lima, MD Taking  Active   Cholecalciferol (VITAMIN D3) 1.25 MG (50000 UT) CAPS [id number] Yes Take 1 capsule by mouth once a week Janith Lima, MD Taking Active   [employer] B-1 100 MG tablet [id number] Yes TAKE 1 TABLET BY MOUTH EVERY DAY Janith Lima, MD Taking Active   cyanocobalamin 2000 MCG tablet [id number] Yes Take 1 tablet (2,000 mcg total) by mouth daily. Janith Lima, MD Taking Active   Empagliflozin-metFORMIN HCl [facility]) 12.5-500 MG TABS [id number] Yes Take 1 tablet by mouth 2 (two) times daily. Janith Lima, MD Taking Active   ferrous sulfate 325 (65 FE) MG tablet [id number] Yes TAKE 1 TABLET BY MOUTH TWICE DAILY WITH A MEAL Janith Lima, MD Taking Active   Fluocinonide Emulsified Base 0.05 % CREA [id number] Yes APPLY  CREAM EXTERNALLY TO AFFECTED AREA TWICE DAILY Janith Lima, MD Taking Active   gabapentin (NEURONTIN) 300 MG capsule [id number] Yes TAKE 1 CAPSULE BY MOUTH THREE TIMES DAILY Janith Lima, MD Taking Active   glucose blood (FREESTYLE TEST STRIPS) test strip [id number] Yes Use to test blood sugar 3 times a day. DX: E11.8 Janith Lima, MD Taking Active   Insulin Pen Needle 31G X 5 MM MISC [id number] Yes Use once daily with insulin Janith Lima, MD Taking Active   irbesartan (AVAPRO) 300 MG tablet [id number] Yes Take 150 mg by mouth daily.  [provider] Taking Active            Med Note Luciana Axe, Alwyn Ren  J   Tue Nov 14, 2019 12:52 PM) Patient reported only taking 150mg  daily  latanoprost (XALATAN) 0.005 % ophthalmic solution [id number] Yes Place 1 drop into both eyes at bedtime. [provider] Taking Active Self  omeprazole (PRILOSEC) 40 MG capsule [id number] Yes TAKE 1 CAPSULE BY MOUTH ONCE DAILY Janith Lima, MD Taking Active   oxyCODONE-acetaminophen (PERCOCET/ROXICET) 5-325 MG tablet [id number] Yes Take 1 tablet by mouth every 8 (eight) hours as needed for severe pain. Janith Lima, MD Taking Active   potassium chloride SA (KLOR-CON) 20 MEQ tablet [id number]  Yes TAKE 1  BY MOUTH TWICE DAILY Janith Lima, MD Taking Active          Medication findings:  HgA1c-7.2% 09/13/2019 On Atorvastatin   Plan: Patient has received her 2021 Social Security Benefit letters. Will send a letter and return addressed envelope to the patient so we will have it on file. Follow up in 3 months.  Elayne Guerin, PharmD, [place] Clinical Pharmacist [phone]

## 2020-01-05 ENCOUNTER — Other Ambulatory Visit: Payer: Self-pay | Admitting: Pharmacy Technician

## 2020-01-05 NOTE — Patient Outreach (Signed)
[place] [facility]) Care Management  01/05/2020  Valerie [name] [birth year] [id number]  Successful outreach call placed to patient in regards to [facility] application for Synjardy.  Spoke to patient, HIPAA identifiers verified.  Patient informed she has received her 90 days supply shipment of Synjardy. Informed her the refill process is the same as last year. Patient verbalized understanding and informs she has no other questions or concerns. Confirms having name and number.  Informed her that we are still working with [employer] on her re enrollment application but they are having processing delays. Informed her would follow up with them and be in touch.  Will follow up with [employer] in 10-14 business days as previously scheduled.  [name] P. Koby Pickup, [place] Management [phone]

## 2020-01-17 ENCOUNTER — Other Ambulatory Visit: Payer: Self-pay | Admitting: Pharmacy Technician

## 2020-01-17 NOTE — Patient Outreach (Signed)
[place] [facility]) Care Management  01/17/2020  KARLISHA SCHERBER Jun 05, 1946 [id number]  Care coordination call placed to [employer] in regards to patient's application for Proventil HFA.  Spoke to [place] who informed they received patient's application on [id number] and mailed the attestation to the patient on 01/15/2020.  Will follow up with patient in 5-15 business days to inquire if she has received the application (patient applied last year and is aware of the attestation process) and/or if she has mailed it back to [employer].  Patrese Neal P. Richard Holz, [place] Management [phone]

## 2020-01-31 ENCOUNTER — Other Ambulatory Visit: Payer: Self-pay | Admitting: Pharmacy Technician

## 2020-01-31 NOTE — Patient Outreach (Signed)
[facility]) Care Management  01/31/2020  Valerie [name] 1946-11-08 [id number]     Incoming call from patient regarding patient assistance receipt of attestation form from [place] for Proventil HFA, HIPAA identifiers verified.   Patient was calling because she knew we were awaiting for her to receive the attestation form from [place]. She informed that her husband just recently passed away and she was dealing with arrangements. She informed she would call me back at a later date.  Follow up:  Will follow up with patient in 7-10 business days.if call is not returned sooner.  [name] P. [name], [place] Management [phone]

## 2020-02-02 DIAGNOSIS — G4733 Obstructive sleep apnea (adult) (pediatric): Secondary | ICD-10-CM | POA: Diagnosis not present

## 2020-02-09 ENCOUNTER — Other Ambulatory Visit: Payer: Self-pay | Admitting: Pharmacy Technician

## 2020-02-09 NOTE — Patient Outreach (Signed)
[facility]) Care Management  02/09/2020  ESTEFANIA GERECKE [birth year] [id number]    Successful call placed to patient regarding patient assistance receipt of attestation form from [place] for Proventil HFA, HIPAA identifiers verified.   Patient informed she is unsure if she has received the attestation form. She informed that her husband passed away and things are in disarray currently. She informed her son was handling the mail and she would ask him. She was unsure of when that would happen.  Follow up:  Will follow up with patient in approximately 1 month if call is not returned.  Bertie Simien P. Orrin Yurkovich, [place] Management [phone]

## 2020-02-11 ENCOUNTER — Ambulatory Visit: Payer: HMO | Attending: Internal Medicine

## 2020-02-11 DIAGNOSIS — Z23 Encounter for immunization: Secondary | ICD-10-CM

## 2020-02-11 NOTE — Progress Notes (Signed)
   Covid-19 Vaccination Clinic  Name:  Valerie [name]    MRN: [id number] DOB: 1946-09-29  02/11/2020  [name] was observed post Covid-19 immunization for 15 minutes without incidence. She was provided with Vaccine Information Sheet and instruction to access the V-Safe system.   [name] was instructed to call 911 with any severe reactions post vaccine: [name] Difficulty breathing  . Swelling of your face and throat  . A fast heartbeat  . A bad rash all over your body  . Dizziness and weakness    Immunizations Administered    Name Date Dose VIS Date Route   Pfizer COVID-19 Vaccine 02/11/2020  9:32 AM 0.3 mL 11/24/2019 Intramuscular   Manufacturer: [place]   Lot: [id number]   [place]: [id number]

## 2020-02-12 ENCOUNTER — Ambulatory Visit: Payer: HMO | Admitting: Internal Medicine

## 2020-02-19 ENCOUNTER — Other Ambulatory Visit: Payer: Self-pay

## 2020-02-19 DIAGNOSIS — E118 Type 2 diabetes mellitus with unspecified complications: Secondary | ICD-10-CM

## 2020-02-19 DIAGNOSIS — I10 Essential (primary) hypertension: Secondary | ICD-10-CM

## 2020-02-19 MED ORDER — CHLORTHALIDONE 25 MG PO TABS: 25.0000 mg | ORAL_TABLET | Freq: Every day | ORAL | 0 refills | Status: DC

## 2020-02-21 ENCOUNTER — Encounter: Payer: Self-pay | Admitting: Internal Medicine

## 2020-02-21 ENCOUNTER — Other Ambulatory Visit: Payer: Self-pay

## 2020-02-21 ENCOUNTER — Ambulatory Visit (INDEPENDENT_AMBULATORY_CARE_PROVIDER_SITE_OTHER): Payer: HMO | Admitting: Internal Medicine

## 2020-02-21 VITALS — BP 172/96 | HR 86 | Temp 97.9°F | Resp 16 | Ht 66.0 in | Wt 252.0 lb

## 2020-02-21 DIAGNOSIS — G8929 Other chronic pain: Secondary | ICD-10-CM

## 2020-02-21 DIAGNOSIS — I10 Essential (primary) hypertension: Secondary | ICD-10-CM | POA: Diagnosis not present

## 2020-02-21 DIAGNOSIS — R002 Palpitations: Secondary | ICD-10-CM | POA: Diagnosis not present

## 2020-02-21 DIAGNOSIS — K2100 Gastro-esophageal reflux disease with esophagitis, without bleeding: Secondary | ICD-10-CM

## 2020-02-21 DIAGNOSIS — Z0001 Encounter for general adult medical examination with abnormal findings: Secondary | ICD-10-CM

## 2020-02-21 DIAGNOSIS — G4701 Insomnia due to medical condition: Secondary | ICD-10-CM

## 2020-02-21 DIAGNOSIS — E876 Hypokalemia: Secondary | ICD-10-CM | POA: Diagnosis not present

## 2020-02-21 DIAGNOSIS — E519 Thiamine deficiency, unspecified: Secondary | ICD-10-CM

## 2020-02-21 DIAGNOSIS — D510 Vitamin B12 deficiency anemia due to intrinsic factor deficiency: Secondary | ICD-10-CM | POA: Diagnosis not present

## 2020-02-21 DIAGNOSIS — K760 Fatty (change of) liver, not elsewhere classified: Secondary | ICD-10-CM | POA: Diagnosis not present

## 2020-02-21 DIAGNOSIS — M48061 Spinal stenosis, lumbar region without neurogenic claudication: Secondary | ICD-10-CM | POA: Diagnosis not present

## 2020-02-21 DIAGNOSIS — M150 Primary generalized (osteo)arthritis: Secondary | ICD-10-CM

## 2020-02-21 DIAGNOSIS — E118 Type 2 diabetes mellitus with unspecified complications: Secondary | ICD-10-CM

## 2020-02-21 DIAGNOSIS — E559 Vitamin D deficiency, unspecified: Secondary | ICD-10-CM

## 2020-02-21 DIAGNOSIS — M8949 Other hypertrophic osteoarthropathy, multiple sites: Secondary | ICD-10-CM | POA: Diagnosis not present

## 2020-02-21 DIAGNOSIS — Z0000 Encounter for general adult medical examination without abnormal findings: Secondary | ICD-10-CM

## 2020-02-21 DIAGNOSIS — E785 Hyperlipidemia, unspecified: Secondary | ICD-10-CM

## 2020-02-21 DIAGNOSIS — Z1231 Encounter for screening mammogram for malignant neoplasm of breast: Secondary | ICD-10-CM

## 2020-02-21 DIAGNOSIS — D518 Other vitamin B12 deficiency anemias: Secondary | ICD-10-CM

## 2020-02-21 DIAGNOSIS — M159 Polyosteoarthritis, unspecified: Secondary | ICD-10-CM

## 2020-02-21 LAB — BASIC METABOLIC PANEL WITH GFR
BUN: 16 mg/dL (ref 6–23)
CO2: 31 meq/L (ref 19–32)
Calcium: 10.5 mg/dL (ref 8.4–10.5)
Chloride: 95 meq/L — ABNORMAL LOW (ref 96–112)
Creatinine, Ser: 0.9 mg/dL (ref 0.40–1.20)
GFR: 74.48 mL/min
Glucose, Bld: 142 mg/dL — ABNORMAL HIGH (ref 70–99)
Potassium: 3.3 meq/L — ABNORMAL LOW (ref 3.5–5.1)
Sodium: 136 meq/L (ref 135–145)

## 2020-02-21 LAB — CBC WITH DIFFERENTIAL/PLATELET
Basophils Absolute: 0.1 K/uL (ref 0.0–0.1)
Basophils Relative: 0.7 % (ref 0.0–3.0)
Eosinophils Absolute: 0.3 K/uL (ref 0.0–0.7)
Eosinophils Relative: 2.4 % (ref 0.0–5.0)
HCT: 35.8 % — ABNORMAL LOW (ref 36.0–46.0)
Hemoglobin: 12 g/dL (ref 12.0–15.0)
Lymphocytes Relative: 28.2 % (ref 12.0–46.0)
Lymphs Abs: 3.1 K/uL (ref 0.7–4.0)
MCHC: 33.5 g/dL (ref 30.0–36.0)
MCV: 89.5 fl (ref 78.0–100.0)
Monocytes Absolute: 0.9 K/uL (ref 0.1–1.0)
Monocytes Relative: 8.2 % (ref 3.0–12.0)
Neutro Abs: 6.6 K/uL (ref 1.4–7.7)
Neutrophils Relative %: 60.5 % (ref 43.0–77.0)
Platelets: 342 K/uL (ref 150.0–400.0)
RBC: 4 Mil/uL (ref 3.87–5.11)
RDW: 15.5 % (ref 11.5–15.5)
WBC: 10.9 K/uL — ABNORMAL HIGH (ref 4.0–10.5)

## 2020-02-21 LAB — PROTIME-INR
INR: 1 ratio (ref 0.8–1.0)
Prothrombin Time: 11.3 s (ref 9.6–13.1)

## 2020-02-21 LAB — VITAMIN D 25 HYDROXY (VIT D DEFICIENCY, FRACTURES): VITD: 66.29 ng/mL (ref 30.00–100.00)

## 2020-02-21 LAB — HEPATIC FUNCTION PANEL
ALT: 18 U/L (ref 0–35)
AST: 18 U/L (ref 0–37)
Albumin: 4.2 g/dL (ref 3.5–5.2)
Alkaline Phosphatase: 95 U/L (ref 39–117)
Bilirubin, Direct: 0 mg/dL (ref 0.0–0.3)
Total Bilirubin: 0.6 mg/dL (ref 0.2–1.2)
Total Protein: 7.7 g/dL (ref 6.0–8.3)

## 2020-02-21 LAB — TSH: TSH: 3.08 u[IU]/mL (ref 0.35–4.50)

## 2020-02-21 LAB — LIPID PANEL
Cholesterol: 144 mg/dL (ref 0–200)
HDL: 47.3 mg/dL (ref >39.00)
NonHDL: 96.44
Total CHOL/HDL Ratio: 3
Triglycerides: 272 mg/dL — ABNORMAL HIGH (ref 0.0–149.0)
VLDL: 54.4 mg/dL — ABNORMAL HIGH (ref 0.0–40.0)

## 2020-02-21 LAB — LDL CHOLESTEROL, DIRECT: Direct LDL: 57 mg/dL

## 2020-02-21 LAB — FOLATE: Folate: 9.2 ng/mL (ref >5.9)

## 2020-02-21 LAB — VITAMIN B12: Vitamin B-12: 508 pg/mL (ref 211–911)

## 2020-02-21 MED ORDER — CYANOCOBALAMIN 2000 MCG PO TABS: 2000.0000 ug | ORAL_TABLET | Freq: Every day | ORAL | 1 refills | Status: DC

## 2020-02-21 MED ORDER — OMEPRAZOLE 40 MG PO CPDR: 40.0000 mg | DELAYED_RELEASE_CAPSULE | Freq: Every day | ORAL | 1 refills | Status: DC

## 2020-02-21 MED ORDER — ESZOPICLONE 2 MG PO TABS: 2.0000 mg | ORAL_TABLET | Freq: Every evening | ORAL | 0 refills | Status: DC | PRN

## 2020-02-21 MED ORDER — OXYCODONE-ACETAMINOPHEN 5-325 MG PO TABS: 1.0000 | ORAL_TABLET | Freq: Three times a day (TID) | ORAL | 0 refills | Status: DC | PRN

## 2020-02-21 NOTE — Progress Notes (Signed)
Subjective:  Patient ID: Valerie [name], female    DOB: 20-Aug-1946  Age: 74 y.o. MRN: [id number]  CC: Hypertension, Hyperlipidemia, Diabetes, and Annual Exam  This visit occurred during the SARS-CoV-2 public health emergency.  Safety protocols were in place, including screening questions prior to the visit, additional usage of staff PPE, and extensive cleaning of exam room while observing appropriate contact time as indicated for disinfecting solutions.    HPI Valerie [name] presents for a CPX.  1.  She continues to complain of nonradiating low back pain.  She has spinal stenosis and has been told by neurosurgeon that she can have surgery until she loses weight but she has not been successful in her weight loss regimen.  She requests a refill of Percocet to control the pain.  She complains that the pain keeps her awake at night and interferes with her daily activities.  2.  She has a longstanding history of palpitations and tells me that over the last month or 2 the palpitations have worsened.  She describes an intermittent sensation of fluttering in her chest.  The fluttering is not associated with chest pain, shortness of breath, dizziness, lightheadedness, or near syncope.  She underwent an event monitor about a year and a half ago and was found to have rare PACs and PVCs.  Outpatient Medications Prior to Visit  Medication Sig Dispense Refill  . albuterol (PROVENTIL) (2.5 MG/3ML) 0.083% nebulizer solution     . albuterol (VENTOLIN HFA) 108 (90 Base) MCG/ACT inhaler Inhale 2 puffs into the lungs every 6 (six) hours as needed. For shortness of breath 3 Inhaler 4  . aspirin 81 MG tablet Take 81 mg by mouth daily.    [name] atorvastatin (LIPITOR) 20 MG tablet TAKE 1 TABLET BY MOUTH EVERY DAY 90 tablet 1  . carvedilol (COREG) 25 MG tablet TAKE 1 TABLET BY MOUTH TWICE DAILY WITH MEALS 180 tablet 0  . chlorthalidone (HYGROTON) 25 MG tablet Take 1 tablet (25 mg total) by mouth daily. 90 tablet 0  .  Cholecalciferol (VITAMIN D3) 1.25 MG (50000 UT) CAPS Take 1 capsule by mouth once a week 12 capsule 0  . [facility] B-1 100 MG tablet TAKE 1 TABLET BY MOUTH EVERY DAY 100 tablet 1  . ferrous sulfate 325 (65 FE) MG tablet TAKE 1 TABLET BY MOUTH TWICE DAILY WITH A MEAL 180 tablet 1  . Fluocinonide Emulsified Base 0.05 % CREA APPLY  CREAM EXTERNALLY TO AFFECTED AREA TWICE DAILY 60 g 2  . gabapentin (NEURONTIN) 300 MG capsule TAKE 1 CAPSULE BY MOUTH THREE TIMES DAILY 270 capsule 0  . glucose blood (FREESTYLE TEST STRIPS) test strip Use to test blood sugar 3 times a day. DX: E11.8 100 each 11  . Insulin Pen Needle 31G X 5 MM MISC Use once daily with insulin 100 each 3  . irbesartan (AVAPRO) 300 MG tablet Take 150 mg by mouth daily.     [name] latanoprost (XALATAN) 0.005 % ophthalmic solution Place 1 drop into both eyes at bedtime.    . cyanocobalamin 2000 MCG tablet Take 1 tablet (2,000 mcg total) by mouth daily. 90 tablet 1  . Empagliflozin-metFORMIN HCl (SYNJARDY) 12.5-500 MG TABS Take 1 tablet by mouth 2 (two) times daily. 180 tablet 1  . omeprazole (PRILOSEC) 40 MG capsule TAKE 1 CAPSULE BY MOUTH ONCE DAILY 90 capsule 1  . oxyCODONE-acetaminophen (PERCOCET/ROXICET) 5-325 MG tablet Take 1 tablet by mouth every 8 (eight) hours as needed for severe pain. [place]  tablet 0  . potassium chloride SA (KLOR-CON) 20 MEQ tablet TAKE 1  BY MOUTH TWICE DAILY 180 tablet 0   No facility-administered medications prior to visit.    ROS Review of Systems  Constitutional: Negative for appetite change, chills, diaphoresis, fatigue and fever.  HENT: Negative.   Eyes: Negative.   Cardiovascular: Positive for palpitations. Negative for chest pain and leg swelling.  Gastrointestinal: Negative for abdominal pain, constipation, diarrhea, nausea and vomiting.  Endocrine: Negative.  Negative for polydipsia, polyphagia and polyuria.  Genitourinary: Negative.  Negative for difficulty urinating.  Musculoskeletal: Positive for arthralgias  and back pain. Negative for myalgias and neck pain.  Skin: Negative.  Negative for color change, pallor and rash.  Neurological: Negative for dizziness, weakness, light-headedness and numbness.  Hematological: Negative for adenopathy. Does not bruise/bleed easily.  Psychiatric/Behavioral: Positive for sleep disturbance. Negative for confusion, decreased concentration and dysphoric mood. The patient is not nervous/anxious.     Objective:  BP (!) 172/96 (BP Location: Left Arm, Patient Position: Sitting, Cuff Size: Large)   Pulse 86   Temp 97.9 F (36.6 C) (Oral)   Resp 16   Ht 5\' 6"  (1.676 m)   Wt 252 lb (114.3 kg)   SpO2 98%   BMI 40.67 kg/m   BP Readings from Last 3 Encounters:  02/21/20 (!) 172/96  10/10/19 (!) 144/86  09/13/19 (!) 160/88    Wt Readings from Last 3 Encounters:  02/21/20 252 lb (114.3 kg)  10/10/19 254 lb (115.2 kg)  09/13/19 259 lb (117.5 kg)    Physical Exam Vitals reviewed.  Constitutional:      Appearance: She is obese.  HENT:     Nose: Nose normal.     Mouth/Throat:     Mouth: Mucous membranes are moist.  Eyes:     General: No scleral icterus.    Conjunctiva/sclera: Conjunctivae normal.  Cardiovascular:     Rate and Rhythm: Normal rate and regular rhythm.     Heart sounds: No murmur.     Comments: EKG -  NSR, 81 bpm NS T wave abnormality No change from the prior EKG Pulmonary:     Effort: Pulmonary effort is normal.     Breath sounds: No stridor. No wheezing, rhonchi or rales.  Abdominal:     General: Abdomen is protuberant. Bowel sounds are normal. There is no distension.     Palpations: Abdomen is soft. There is no hepatomegaly, splenomegaly or mass.  Musculoskeletal:     Cervical back: Neck supple.     Right lower leg: No edema.     Left lower leg: No edema.  Lymphadenopathy:     Cervical: No cervical adenopathy.  Skin:    General: Skin is warm and dry.     Findings: No rash.  Neurological:     General: No focal deficit  present.     Mental Status: She is alert and oriented to person, place, and time. Mental status is at baseline.  Psychiatric:        Mood and Affect: Mood normal.        Behavior: Behavior normal.     Lab Results  Component Value Date   WBC 10.9 (H) 02/21/2020   HGB 12.0 02/21/2020   HCT 35.8 (L) 02/21/2020   PLT 342.0 02/21/2020   GLUCOSE 142 (H) 02/21/2020   CHOL 144 02/21/2020   TRIG 272.0 (H) 02/21/2020   HDL 47.30 02/21/2020   LDLDIRECT 57.0 02/21/2020   LDLCALC 43 03/28/2014   ALT  18 02/21/2020   AST 18 02/21/2020   NA 136 02/21/2020   K 3.3 (L) 02/21/2020   CL 95 (L) 02/21/2020   CREATININE 0.90 02/21/2020   BUN 16 02/21/2020   CO2 31 02/21/2020   TSH 3.08 02/21/2020   INR 1.0 02/21/2020   HGBA1C 7.3 (H) 02/21/2020   MICROALBUR 0.8 05/04/2019    MYOCARDIAL PERFUSION IMAGING  Result Date: 10/07/2018  Nuclear stress EF: 75%.  Normal perfusion  The study is normal.  This is a low risk study.     Assessment & Plan:   Valerie [name] was seen today for hypertension, hyperlipidemia, diabetes and annual exam.  Diagnoses and all orders for this visit:  Essential hypertension, benign- Her blood pressure is adequately well controlled but she has developed hypokalemia.  Will treat with an oral potassium supplement. -     CBC with Differential/Platelet -     Basic metabolic panel -     TSH -     Consult to [place] Management -     potassium chloride SA (KLOR-CON) 20 MEQ tablet; TAKE 1  BY MOUTH TWICE DAILY  Fatty liver disease, nonalcoholic- Her LFTs are normal now.  She will continue to work on her lifestyle modifications. -     Hepatic function panel -     Protime-INR  Type II diabetes mellitus with manifestations (Valerie [name])- She has achieved adequate glycemic control. -     Hemoglobin A1c -     Consult to [place] Management -     Empagliflozin-metFORMIN HCl (SYNJARDY) 12.5-500 MG TABS; Take 1 tablet by mouth 2 (two) times daily.  Vitamin B12 deficiency anemia due  to intrinsic factor deficiency- Her B12 and folate levels are normal.  Will continue high-dose oral B12 supplementation. -     Folate -     Vitamin B12  Hyperlipidemia with target LDL less than 100- She has achieved her LDL goal and is doing well on the statin. -     Lipid panel  Thiamine deficiency, unspecified- I will monitor her thiamine level. -     CBC with Differential/Platelet -     Vitamin B1  Vitamin D deficiency disease- Her vitamin D level is normal now -     VITAMIN D 25 Hydroxy (Vit-D Deficiency, Fractures)  Visit for screening mammogram -     Cancel: MM DIGITAL SCREENING BILATERAL; Future -     MM DIGITAL SCREENING BILATERAL; Future  Routine general medical examination at a health care facility- Exam completed, labs reviewed, vaccines reviewed and updated, cervical cancer screening is not indicated, colon cancer screening is up-to-date, she is referred for breast cancer screening.  Palpitations- She is in sinus rhythm today.  I have asked her to undergo another cardiac event monitor to see if she has developed a more serious dysrhythmia like A. fib or a flutter or to see if the load of PACs and PVCs is so high that they need to be treated. -     EKG 12-Lead -     Cancel: CARDIAC EVENT MONITOR; Future -     CARDIAC EVENT MONITOR; Future  Insomnia secondary to chronic pain -     eszopiclone (LUNESTA) 2 MG TABS tablet; Take 1 tablet (2 mg total) by mouth at bedtime as needed for sleep. Take immediately before bedtime  Spinal stenosis of lumbar 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 -     oxyCODONE-acetaminophen (PERCOCET/ROXICET) 5-325 MG tablet; Take 1 tablet by mouth every 8 (eight) hours as needed  for severe pain.  Primary osteoarthritis involving multiple joints -     oxyCODONE-acetaminophen (PERCOCET/ROXICET) 5-325 MG tablet; Take 1 tablet by mouth every 8 (eight) hours as needed for severe pain.  Gastroesophageal reflux disease with esophagitis -     omeprazole (PRILOSEC) 40 MG  capsule; Take 1 capsule (40 mg total) by mouth daily.  Other vitamin B12 deficiency anemia -     cyanocobalamin 2000 MCG tablet; Take 1 tablet (2,000 mcg total) by mouth daily.  Hypokalemia -     potassium chloride SA (KLOR-CON) 20 MEQ tablet; TAKE 1  BY MOUTH TWICE DAILY  Other orders -     LDL cholesterol, direct   I have changed Valerie [name] omeprazole. I am also having her start on eszopiclone. Additionally, I am having her maintain her latanoprost, aspirin, albuterol, albuterol, Insulin Pen Needle, glucose blood, irbesartan, Fluocinonide Emulsified Base, ferrous sulfate, atorvastatin, carvedilol, CVS B-1, Vitamin D3, gabapentin, chlorthalidone, oxyCODONE-acetaminophen, cyanocobalamin, potassium chloride SA, and Synjardy.  Meds ordered this encounter  Medications  . oxyCODONE-acetaminophen (PERCOCET/ROXICET) 5-325 MG tablet    Sig: Take 1 tablet by mouth every 8 (eight) hours as needed for severe pain.    Dispense:  90 tablet    Refill:  0  . eszopiclone (LUNESTA) 2 MG TABS tablet    Sig: Take 1 tablet (2 mg total) by mouth at bedtime as needed for sleep. Take immediately before bedtime    Dispense:  90 tablet    Refill:  0  . omeprazole (PRILOSEC) 40 MG capsule    Sig: Take 1 capsule (40 mg total) by mouth daily.    Dispense:  90 capsule    Refill:  1  . cyanocobalamin 2000 MCG tablet    Sig: Take 1 tablet (2,000 mcg total) by mouth daily.    Dispense:  90 tablet    Refill:  1  . potassium chloride SA (KLOR-CON) 20 MEQ tablet    Sig: TAKE 1  BY MOUTH TWICE DAILY    Dispense:  180 tablet    Refill:  0  . Empagliflozin-metFORMIN HCl (SYNJARDY) 12.5-500 MG TABS    Sig: Take 1 tablet by mouth 2 (two) times daily.    Dispense:  180 tablet    Refill:  1     Follow-up: Return in about 3 months (around 05/23/2020).  [name], MD

## 2020-02-21 NOTE — Patient Instructions (Signed)
Health Maintenance, Female Adopting a healthy lifestyle and getting preventive care are important in promoting health and wellness. Ask your health care provider about:  The right schedule for you to have regular tests and exams.  Things you can do on your own to prevent diseases and keep yourself healthy. What should I know about diet, weight, and exercise? Eat a healthy diet   Eat a diet that includes plenty of vegetables, fruits, low-fat dairy products, and lean protein.  Do not eat a lot of foods that are high in solid fats, added sugars, or sodium. Maintain a healthy weight Body mass index (BMI) is used to identify weight problems. It estimates body fat based on height and weight. Your health care provider can help determine your BMI and help you achieve or maintain a healthy weight. Get regular exercise Get regular exercise. This is one of the most important things you can do for your health. Most adults should:  Exercise for at least 150 minutes each week. The exercise should increase your heart rate and make you sweat (moderate-intensity exercise).  Do strengthening exercises at least twice a week. This is in addition to the moderate-intensity exercise.  Spend less time sitting. Even light physical activity can be beneficial. Watch cholesterol and blood lipids Have your blood tested for lipids and cholesterol at 72 years of age, then have this test every 5 years. Have your cholesterol levels checked more often if:  Your lipid or cholesterol levels are high.  You are older than 72 years of age.  You are at high risk for heart disease. What should I know about cancer screening? Depending on your health history and family history, you may need to have cancer screening at various ages. This may include screening for:  Breast cancer.  Cervical cancer.  Colorectal cancer.  Skin cancer.  Lung cancer. What should I know about heart disease, diabetes, and high blood  pressure? Blood pressure and heart disease  High blood pressure causes heart disease and increases the risk of stroke. This is more likely to develop in people who have high blood pressure readings, are of African descent, or are overweight.  Have your blood pressure checked: ? Every 3-5 years if you are 18-39 years of age. ? Every year if you are 40 years old or older. Diabetes Have regular diabetes screenings. This checks your fasting blood sugar level. Have the screening done:  Once every three years after age 40 if you are at a normal weight and have a low risk for diabetes.  More often and at a younger age if you are overweight or have a high risk for diabetes. What should I know about preventing infection? Hepatitis B If you have a higher risk for hepatitis B, you should be screened for this virus. Talk with your health care provider to find out if you are at risk for hepatitis B infection. Hepatitis C Testing is recommended for:  Everyone born from 1945 through 1965.  Anyone with known risk factors for hepatitis C. Sexually transmitted infections (STIs)  Get screened for STIs, including gonorrhea and chlamydia, if: ? You are sexually active and are younger than 72 years of age. ? You are older than 72 years of age and your health care provider tells you that you are at risk for this type of infection. ? Your sexual activity has changed since you were last screened, and you are at increased risk for chlamydia or gonorrhea. Ask your health care provider if   you are at risk.  Ask your health care provider about whether you are at high risk for HIV. Your health care provider may recommend a prescription medicine to help prevent HIV infection. If you choose to take medicine to prevent HIV, you should first get tested for HIV. You should then be tested every 3 months for as long as you are taking the medicine. Pregnancy  If you are about to stop having your period (premenopausal) and  you may become pregnant, seek counseling before you get pregnant.  Take 400 to 800 micrograms (mcg) of folic acid every day if you become pregnant.  Ask for birth control (contraception) if you want to prevent pregnancy. Osteoporosis and menopause Osteoporosis is a disease in which the bones lose minerals and strength with aging. This can result in bone fractures. If you are 65 years old or older, or if you are at risk for osteoporosis and fractures, ask your health care provider if you should:  Be screened for bone loss.  Take a calcium or vitamin D supplement to lower your risk of fractures.  Be given hormone replacement therapy (HRT) to treat symptoms of menopause. Follow these instructions at home: Lifestyle  Do not use any products that contain nicotine or tobacco, such as cigarettes, e-cigarettes, and chewing tobacco. If you need help quitting, ask your health care provider.  Do not use street drugs.  Do not share needles.  Ask your health care provider for help if you need support or information about quitting drugs. Alcohol use  Do not drink alcohol if: ? Your health care provider tells you not to drink. ? You are pregnant, may be pregnant, or are planning to become pregnant.  If you drink alcohol: ? Limit how much you use to 0-1 drink a day. ? Limit intake if you are breastfeeding.  Be aware of how much alcohol is in your drink. In the [place] one drink equals one 12 oz bottle of beer (355 mL), one 5 oz glass of wine (148 mL), or one 1 oz glass of hard liquor (44 mL). General instructions  Schedule regular health, dental, and eye exams.  Stay current with your vaccines.  Tell your health care provider if: ? You often feel depressed. ? You have ever been abused or do not feel safe at home. Summary  Adopting a healthy lifestyle and getting preventive care are important in promoting health and wellness.  Follow your health care provider's instructions about healthy  diet, exercising, and getting tested or screened for diseases.  Follow your health care provider's instructions on monitoring your cholesterol and blood pressure. This information is not intended to replace advice given to you by your health care provider. Make sure you discuss any questions you have with your health care provider. Document Revised: 11/23/2018 Document Reviewed: 11/23/2018 Elsevier Patient Education  2020 Elsevier Inc.  

## 2020-02-22 ENCOUNTER — Telehealth: Payer: Self-pay | Admitting: Radiology

## 2020-02-22 LAB — HEMOGLOBIN A1C: Hgb A1c MFr Bld: 7.3 % — ABNORMAL HIGH (ref 4.6–6.5)

## 2020-02-22 MED ORDER — SYNJARDY 12.5-500 MG PO TABS: 1.0000 | ORAL_TABLET | Freq: Two times a day (BID) | ORAL | 1 refills | Status: DC

## 2020-02-22 MED ORDER — POTASSIUM CHLORIDE CRYS ER 20 MEQ PO TBCR: EXTENDED_RELEASE_TABLET | ORAL | 0 refills | Status: DC

## 2020-02-22 NOTE — Telephone Encounter (Signed)
Enrolled patient for a 30 day [employer] event monitor to be mailed to patients home. Brief instructions were gone over with the patients and she knows to expect the monitor to arrive in 5-7 days.   *Patient had an allergic reaction previous time wearing monitor. Sensitive skin patches were ordered.

## 2020-02-25 ENCOUNTER — Encounter: Payer: Self-pay | Admitting: Internal Medicine

## 2020-02-25 LAB — VITAMIN B1: Vitamin B1 (Thiamine): 30 nmol/L (ref 8–30)

## 2020-02-26 ENCOUNTER — Telehealth: Payer: Self-pay

## 2020-02-26 ENCOUNTER — Other Ambulatory Visit: Payer: Self-pay | Admitting: Internal Medicine

## 2020-02-26 NOTE — Telephone Encounter (Signed)
(  KeyFF:[id number])

## 2020-02-27 ENCOUNTER — Telehealth: Payer: Self-pay

## 2020-02-27 NOTE — Telephone Encounter (Signed)
PA was approved. Pt has been informed of same.

## 2020-02-27 NOTE — Telephone Encounter (Signed)
Pt stated that the Valerie [name] is too expensive.   Is there an alternative sleep aid?

## 2020-02-28 ENCOUNTER — Other Ambulatory Visit: Payer: Self-pay | Admitting: Internal Medicine

## 2020-02-28 ENCOUNTER — Other Ambulatory Visit: Payer: Self-pay | Admitting: Pharmacy Technician

## 2020-02-28 DIAGNOSIS — D509 Iron deficiency anemia, unspecified: Secondary | ICD-10-CM

## 2020-02-28 NOTE — Patient Outreach (Signed)
[facility]) Care Management  02/28/2020  Valerie [name] 01/12/1946 [id number]    Care coordination call placed to [employer] in regards to patient's Proventil HFA application.  Spoke to [place] who informed they have not received back or processed the patient's attestation form. She suggested to try back in a few weeks.  Will follow up with patient and/or [employer] in 10-14 business days.  Ceil Roderick P. Khalise Billard, [place]  [phone]

## 2020-03-01 ENCOUNTER — Encounter (INDEPENDENT_AMBULATORY_CARE_PROVIDER_SITE_OTHER): Payer: HMO

## 2020-03-01 DIAGNOSIS — R002 Palpitations: Secondary | ICD-10-CM

## 2020-03-04 ENCOUNTER — Other Ambulatory Visit: Payer: Self-pay

## 2020-03-04 NOTE — Patient Outreach (Signed)
  [facility]) Care Management Chronic Special Needs Program    03/04/2020  Name: Valerie [name], DOB: [birth year]  MRN: [id number]   [name] is enrolled in a chronic special needs plan for Diabetes. RNCM called to assist with completion of health risk assessment and update individualized care plan. No answer. HIPAA compliant message left.   Plan: Chronic care management coordinator will attempt outreach within 1-2 weeks.  [name], RN, MSN, [place] [phone]

## 2020-03-06 ENCOUNTER — Ambulatory Visit: Payer: HMO | Attending: Internal Medicine

## 2020-03-06 DIAGNOSIS — Z23 Encounter for immunization: Secondary | ICD-10-CM

## 2020-03-06 NOTE — Progress Notes (Signed)
   Covid-19 Vaccination Clinic  Name:  Valerie [name]    MRN: [id number] DOB: 1946-06-01  03/06/2020  Valerie [name] was observed post Covid-19 immunization for 30 minutes based on pre-vaccination screening without incident. She was provided with Vaccine Information Sheet and instruction to access the V-Safe system.   Valerie [name] was instructed to call 911 with any severe reactions post vaccine: [name] Difficulty breathing  . Swelling of face and throat  . A fast heartbeat  . A bad rash all over body  . Dizziness and weakness   Immunizations Administered    Name Date Dose VIS Date Route   Pfizer COVID-19 Vaccine 03/06/2020  1:01 PM 0.3 mL 11/24/2019 Intramuscular   Manufacturer: [place]   Lot: [id number]   [place]: [id number]

## 2020-03-08 ENCOUNTER — Other Ambulatory Visit: Payer: Self-pay | Admitting: Internal Medicine

## 2020-03-08 ENCOUNTER — Other Ambulatory Visit: Payer: Self-pay

## 2020-03-08 DIAGNOSIS — G8929 Other chronic pain: Secondary | ICD-10-CM

## 2020-03-08 DIAGNOSIS — G4701 Insomnia due to medical condition: Secondary | ICD-10-CM

## 2020-03-08 MED ORDER — BELSOMRA 15 MG PO TABS: 1.0000 | ORAL_TABLET | Freq: Every day | ORAL | 3 refills | Status: DC

## 2020-03-08 NOTE — Telephone Encounter (Signed)
    Valerie [name] from [facility] calling [phone] Patient is reporting trouble sleeping. Has an alternative been discussed,Lunesta too costly for patient

## 2020-03-08 NOTE — Patient Outreach (Signed)
[facility]) Care Management Chronic Special Needs Program  03/08/2020  Name: Valerie [name] DOB: [birth year]  MRN: [id number]  Valerie [name] is enrolled in a chronic special needs plan for Diabetes. Chronic Care Management Coordinator telephoned client to assist client with completing health risk assessment and to update individualized care plan.  RNCM reviewed the chronic care management program, importance of client participation, and taking their care plan to all provider appointments and inpatient facilities.   Health risk assessment completed.  Subjective: client reports history of diabetes and hypertension. Client reports she checks blood sugar at least daily. Last A1C 7.3 on 02/21/2020.  She reports recent loss of her husband. PHQ2 6 PHQ9 21. Client reports recent loss of her husband on February 2021. She reports difficulty sleeping and that she was prescribed sleeping medication, but is unable to pay for medication. She states she is awaiting a different medication to be called into her pharmacy by primary care office. Client is also receptive to social work referral for counseling-grief, signs/symptoms of depression.   Goals Addressed            This Visit's Progress   .  Acknowledge receipt of Advanced Directive package   On track    You have requested an additional packet: Mailed education: Advanced Directive". Please review and call if you have any questions. Mailed another advanced directive packet. Please call if you would like assistance in completing.    .  Diabetes Patient stated goal-"Loose weight" (pt-stated)   Not on track    Continue goal: It is important to take time to care for yourself. Find ways to carve out time for yourself.  Continue previous goal to increase activity to approximately 20-30 minutes of activity per day/ 5 days per week.  Mailed education: "walking to fitness". Please review and call if you have any questions. I am going  to refer you to the [employer] health coach who can assist you with this goal.     . Client will report improved coping within the next 6-9 months.   No change    Goal continued 2021 Discussed grief as a process.  RN care coordinator and discussed with your primary care provider to update. Social work referral made to assist you with grief counseling.  You may also contact  [facility] health line as a resource at [phone] if you need to speak with someone. Continue goals for increased activity. Continue prayer life.  Carve out time for yourself Reduce stress.    . Client will verbalize knowledge of self management of Hypertension as evidences by BP reading of 140/90 or less; or as defined by provider   On track    Follow up with your doctor as scheduled. Take your medications as prescribed by your doctor. Ask your doctor "what is my target blood pressure range". Monitor your blood pressure and take results to your doctor's appointment.  Monitor the amount of salt you are eating. Continue to exercise as tolerated and remain active. Eat heart healthy diet (full of fruits, vegetables, whole grains, lean protein, water--limit salt, fat, and sugar/simple carb intake).  Mailed education: "High blood pressure, what you can do?". Please review and call if you have any questions.    . COMPLETED: Exercise 150 minutes per week (moderate activity)       Duplicate goal    . HEMOGLOBIN A1C < 7.0       A1C 7.3 on 02/21/20  Diabetes self management  actions:  Glucose monitoring per provider recommendations  Eat Healthy  Visit provider every 3-6 months as directed  Hbg A1C level every 3-6 months. Ask your doctor, "what is my Target A1C goal?" Ask your doctor, "what is my Target blood sugar range?"    . COMPLETED: Maintain timely refills of diabetic medication as prescribed within the year .       Client denies difficulty obtaining diabetic medications.    . Obtain  annual  Lipid Profile, LDL-C   On track    It is important to follow up with your doctor for recommended labs. Try to avoid saturated fats, trans-fats and eat more fiber. You have requested information on Triglycerides: I am sending the article, "High Triglycerides". Please review and call if you have any questions.    . Obtain Annual Eye (retinal)  Exam    On track    It is important for you to see your provider for yearly recommended exam.    . Obtain Annual Foot Exam   On track    It is important for you to attend scheduled provider visit for yearly recommended exam.    . Obtain annual screen for micro albuminuria (urine) , nephropathy (kidney problems)   On track    It is important for you to attend scheduled provider visits for recommended labs. This lab looks at how your kidneys are working.    . Obtain Hemoglobin A1C at least 2 times per year   On track    A1C 7.3 on 02/21/2020, A1C 7.2 on 09/13/2019; A1C 6.8 on 05/04/2019.  Goal renewed 2021: It is important to follow up with your provider as scheduled for recommended labs.      . Patient Stated: client will voice receipt of requested information on "foods with a lot of potassium"   On track    Per your request: Mailed education: "High potassium diet". Please review and call if you have any questions.    . Visit Primary Care Provider or Endocrinologist at least 2 times per year    On track    It is important for you to follow up with your provider as scheduled for recommended labs, procedures and prescription refills.      Covid precautions discussed. Client reports she has completed her second covid vaccine.  RNCM reinforced the 24 hour nurse advice line for health concerns, reinforced availability of health care concierge for benefits questions and encouraged client to call RNCM as needed.  RNCM called primary care office, spoke with [facility] and informed of client's PHQ2 and PHQ9 scores. In addition informed client continues  to have difficulty sleeping and is not able to afford Lunesta. Request alternative recommendation.   Plan: Send updated care plan to client; send updated care plan to primary care; send referral to [employer] health coach to assist in weight loss goal; send educational material. Sent social work referral for assistance with grief and grief counseling.    [name], RN, MSN, [place] [phone]

## 2020-03-11 ENCOUNTER — Other Ambulatory Visit: Payer: Self-pay | Admitting: Pharmacy Technician

## 2020-03-11 NOTE — Patient Outreach (Signed)
[facility]) Care Management  03/11/2020  Valerie [name] 1946/05/13 [id number]   Successful call placed to patient regarding patient assistance receipt of attestation form from [employer] for Proventil HFA, HIPAA identifiers verified.   Patient informed she has received the attestation form. She was able to complete the form while on the line today. She informed she would place in mail this week.  Follow up:  Will follow up with [employer] in 10-15 business days to confirm they received the attestation form/application back  [employer]. Lorissa Kishbaugh, [place]  [phone]

## 2020-03-11 NOTE — Telephone Encounter (Signed)
Patient informed that Belsomra has been sent in.

## 2020-03-12 ENCOUNTER — Other Ambulatory Visit: Payer: Self-pay | Admitting: *Deleted

## 2020-03-12 ENCOUNTER — Encounter: Payer: Self-pay | Admitting: *Deleted

## 2020-03-12 NOTE — Patient Outreach (Signed)
[facility]) Care Management  03/12/2020  ELICIA GRAPES 07/07/1946 [id number]   CSW made an initial attempt to try and contact patient today to perform the initial phone assessment, as well as assess and assist with social work needs and services, without success.  A HIPAA compliant message was left for patient on voicemail.  CSW is currently awaiting a return call.  CSW will make a second outreach attempt within the next 3-4 business days, if a return call is not received from patient in the meantime.  CSW will also mail an Outreach Letter to patient's home requesting that patient contact CSW if patient is interested in receiving social work services through [place] with Scientist, clinical (histocompatibility and immunogenetics).  [name], BSW, MSW, LCSW  Licensed Education officer, environmental Health System  Mailing [place] [address] [place], [place] [address] Physical Address-[address], [place], [place] [address] Toll Free Main # [phone] Fax # [phone] Cell # [phone]  Office # [phone] [name]

## 2020-03-13 ENCOUNTER — Ambulatory Visit: Payer: Self-pay | Admitting: *Deleted

## 2020-03-18 ENCOUNTER — Encounter: Payer: Self-pay | Admitting: *Deleted

## 2020-03-18 ENCOUNTER — Other Ambulatory Visit: Payer: Self-pay | Admitting: *Deleted

## 2020-03-18 NOTE — Patient Outreach (Addendum)
[facility]) Care Management  03/18/2020  [name] 07-22-1946 [id number]   CSW was able to make initial contact with patient today to perform phone assessment, as well as assess and assist with social work needs and services.  CSW introduced self, explained role and types of services provided through [facility] [facility] ([place]).  CSW further explained to patient that CSW works with patient's Chronic Special Needs Program RNCM, also with [facility], [name].  CSW then explained the reason for the call, indicating that [name] thought that patient would benefit from social work services and resources to assist with counseling and supportive services for symptoms of depression, as well as grief and loss.  CSW obtained two HIPAA compliant identifiers from patient, which included patient's name and date of birth.  Patient admitted to experiencing symptoms of grief due to the recent loss of her husband, in February of 2021.  Patient went on to say that her three sons, especially Valerie [name], the one with whom patient currently resides, are very concerned about her health and well-being, wanting patient to seek counseling services for her symptoms.  CSW offered to provide patient with free telephonic counseling services, for which patient agreed to consider.  CSW agreed to mail patient a list of counselors/therapists, to provide psychotherapeutic services, as well as a list of psychiatrists, to provide medication management.  CSW will also mail patient information about various grief and loss support programs, such as Grief Share and [employer].  CSW was able to provide brief counseling services to patient today, in the form of Cognitive Behavioral Therapy.  Patient admitted to having a very good support system, through family members, friends and church community.  CSW spoke with patient about considering an antidepressant  medication, just to help her get through this most difficult time in her life.  Patient agreed to give it some consideration, then speak with her Primary Care Physician, [name] if she decides that she would like to give it a try.  CSW will follow-up with patient again on Wednesday, March 27, 2020, around 9:00am, to ensure that she received the packet of resource information mailed to her home, as well as assist with the referral process, if necessary.  CSW was able to confirm that patient has the correct contact information for CSW.    All of the following resources will be mailed to patient's home today: [place]; [facility] the [place]; Grief Share Support Groups; Manufacturing [employer]; List of Therapists & Psychiatrists in [place], [place]; List of Medicaid Approved Psychiatrists in [place], [place]; 12 Common Symptoms of Depression That Shouldn't Be Ignored; Relaxation Techniques for Depression.  CSW will also mail patient the following list of EMMI information for her independent review:  Coping with a Health Condition:  Signs of Depression; Depression; Depression: Medication; Depression:  Other Things You Can Do; Talk Therapy for Depression; Dealing with Death:  Adult.   [name], BSW, MSW, LCSW  Licensed Education officer, environmental Health System  Mailing [place] [address], [place], [place] [address] Physical Address-[address], [place], [place] [address] Toll Free Main # [phone] Fax # [phone] Cell # [phone]  Office # [phone] [name]

## 2020-03-19 ENCOUNTER — Encounter: Payer: Self-pay | Admitting: *Deleted

## 2020-03-27 ENCOUNTER — Other Ambulatory Visit: Payer: Self-pay | Admitting: Internal Medicine

## 2020-03-27 ENCOUNTER — Other Ambulatory Visit: Payer: Self-pay | Admitting: *Deleted

## 2020-03-27 ENCOUNTER — Encounter: Payer: Self-pay | Admitting: Internal Medicine

## 2020-03-27 ENCOUNTER — Ambulatory Visit (INDEPENDENT_AMBULATORY_CARE_PROVIDER_SITE_OTHER): Payer: HMO | Admitting: Internal Medicine

## 2020-03-27 ENCOUNTER — Encounter: Payer: Self-pay | Admitting: *Deleted

## 2020-03-27 ENCOUNTER — Other Ambulatory Visit: Payer: Self-pay

## 2020-03-27 ENCOUNTER — Telehealth: Payer: Self-pay

## 2020-03-27 VITALS — BP 158/88 | HR 80 | Temp 98.2°F | Resp 16 | Ht 66.0 in | Wt 259.5 lb

## 2020-03-27 DIAGNOSIS — E118 Type 2 diabetes mellitus with unspecified complications: Secondary | ICD-10-CM

## 2020-03-27 DIAGNOSIS — M48061 Spinal stenosis, lumbar region without neurogenic claudication: Secondary | ICD-10-CM

## 2020-03-27 DIAGNOSIS — I10 Essential (primary) hypertension: Secondary | ICD-10-CM | POA: Diagnosis not present

## 2020-03-27 DIAGNOSIS — E519 Thiamine deficiency, unspecified: Secondary | ICD-10-CM

## 2020-03-27 DIAGNOSIS — M159 Polyosteoarthritis, unspecified: Secondary | ICD-10-CM

## 2020-03-27 DIAGNOSIS — E876 Hypokalemia: Secondary | ICD-10-CM | POA: Diagnosis not present

## 2020-03-27 DIAGNOSIS — M8949 Other hypertrophic osteoarthropathy, multiple sites: Secondary | ICD-10-CM | POA: Diagnosis not present

## 2020-03-27 DIAGNOSIS — T502X5A Adverse effect of carbonic-anhydrase inhibitors, benzothiadiazides and other diuretics, initial encounter: Secondary | ICD-10-CM | POA: Diagnosis not present

## 2020-03-27 DIAGNOSIS — Z6841 Body Mass Index (BMI) 40.0 and over, adult: Secondary | ICD-10-CM

## 2020-03-27 DIAGNOSIS — M150 Primary generalized (osteo)arthritis: Secondary | ICD-10-CM

## 2020-03-27 DIAGNOSIS — N3281 Overactive bladder: Secondary | ICD-10-CM

## 2020-03-27 MED ORDER — OXYCODONE-ACETAMINOPHEN 5-325 MG PO TABS: 1.0000 | ORAL_TABLET | Freq: Three times a day (TID) | ORAL | 0 refills | Status: DC | PRN

## 2020-03-27 MED ORDER — MIRABEGRON ER 25 MG PO TB24: 25.0000 mg | ORAL_TABLET | Freq: Every day | ORAL | 0 refills | Status: DC

## 2020-03-27 MED ORDER — TRULICITY 0.75 MG/0.5ML ~~LOC~~ SOAJ: 0.7500 mg | SUBCUTANEOUS | 0 refills | Status: DC

## 2020-03-27 NOTE — Patient Outreach (Signed)
[facility]) Care Management  03/27/2020  Valerie [name] Jan 27, 1946 [id number]   CSW was able to make contact with patient today to follow-up regarding social work services and resources, as well as to ensure that patient received the packet of resource information mailed to her home by CSW.  Patient confirmed receipt, admitting that she has already had an opportunity to review the contents and is actually considering attending a support program, either through Grief Share or [employer].  CSW inquired as to whether or not patient would be interested in having CSW provide free telephonic counseling and supportive services to patient, at least until patient is able to get established with a support program, and/or a therapist and psychiatrist, but patient declined.  Patient admitted that she was not actually interested in receiving counseling services, that it was her son's recommendation that she seek treatment.  Patient stated, "Now I can tell him I sought treatment".  Patient reported that she believes she is coping well and that she is dealing with her deceased husband's death in her own way.  CSW will perform a case closure on patient, as all goals of treatment have been met from social work standpoint and no additional social work needs have been identified at this time.  CSW will notify patient's Chronic Special Needs RNCM with [facility], [name] of CSW's plans to close patient's case.  CSW will fax an update to patient's Primary Care Physician, [name] to ensure that he is aware of CSW's involvement with patient's plan of care.  CSW was able to confirm that patient has the correct contact information for CSW, encouraging patient to contact CSW directly if additional social work needs arise in the near future or if patient changes her mind about wanting to receive counseling services.    [name], BSW, MSW, LCSW   Licensed Education officer, environmental Health System  Mailing [place] [address], [place], [place] [address] Physical Address-[address], [place], [place], [place] [address] Toll Free Main # [phone] Fax # [phone] Cell # [phone]  Office # [phone] [name]

## 2020-03-27 NOTE — Patient Instructions (Signed)

## 2020-03-27 NOTE — Telephone Encounter (Signed)
New message   1. What are BP readings?   Early this morning   This morning  173/93 left arm   Recheck @ 12:19pm  179/102 right arm    2. Are you having any other symptoms (ex. Dizziness, headache, blurred vision, passed out)? No    3. What is your BP issue?   Appt on 4.26.21 was R/s to sooner appt per patient request appt today @ 3:20pm

## 2020-03-27 NOTE — Progress Notes (Signed)
Subjective:  Patient ID: Valerie [name], female    DOB: 1946-09-19  Age: 74 y.o. MRN: [id number]  CC: Hypertension, Anemia, and Back Pain  This visit occurred during the SARS-CoV-2 public health emergency.  Safety protocols were in place, including screening questions prior to the visit, additional usage of staff PPE, and extensive cleaning of exam room while observing appropriate contact time as indicated for disinfecting solutions.    HPI Valerie [name] presents for f/up - She complains of a several month history of worsening of frequent urination and nocturia.  She gets up several times a night.  She also complains of weight gain.  She has chronic low back pain caused by spinal stenosis.  She has seen a neurosurgeon but was told that she had to lose weight to undergo surgery.  She wants to see someone else to see if there are nonsurgical options to treat her low back pain.  She requests a refill of Percocet.   Outpatient Medications Prior to Visit  Medication Sig Dispense Refill  . albuterol (PROVENTIL) (2.5 MG/3ML) 0.083% nebulizer solution     . albuterol (VENTOLIN HFA) 108 (90 Base) MCG/ACT inhaler Inhale 2 puffs into the lungs every 6 (six) hours as needed. For shortness of breath 3 Inhaler 4  . aspirin 81 MG tablet Take 81 mg by mouth daily.    [name] atorvastatin (LIPITOR) 20 MG tablet TAKE 1 TABLET BY MOUTH EVERY DAY 90 tablet 1  . carvedilol (COREG) 25 MG tablet TAKE 1 TABLET BY MOUTH TWICE DAILY WITH MEALS 180 tablet 0  . chlorthalidone (HYGROTON) 25 MG tablet Take 1 tablet (25 mg total) by mouth daily. 90 tablet 0  . Cholecalciferol (VITAMIN D3) 1.25 MG (50000 UT) CAPS Take 1 capsule by mouth once a week 12 capsule 0  . cyanocobalamin 2000 MCG tablet Take 1 tablet (2,000 mcg total) by mouth daily. 90 tablet 1  . Empagliflozin-metFORMIN HCl (SYNJARDY) 12.5-500 MG TABS Take 1 tablet by mouth 2 (two) times daily. 180 tablet 1  . ferrous sulfate 325 (65 FE) MG tablet TAKE 1 TABLET BY  MOUTH TWICE DAILY WITH A MEAL 180 tablet 1  . Fluocinonide Emulsified Base 0.05 % CREA APPLY  CREAM EXTERNALLY TO AFFECTED AREA TWICE DAILY 60 g 2  . gabapentin (NEURONTIN) 300 MG capsule TAKE 1 CAPSULE BY MOUTH THREE TIMES DAILY 270 capsule 0  . glucose blood (FREESTYLE TEST STRIPS) test strip Use to test blood sugar 3 times a day. DX: E11.8 100 each 11  . Insulin Pen Needle 31G X 5 MM MISC Use once daily with insulin 100 each 3  . latanoprost (XALATAN) 0.005 % ophthalmic solution Place 1 drop into both eyes at bedtime.    [name] omeprazole (PRILOSEC) 40 MG capsule Take 1 capsule (40 mg total) by mouth daily. 90 capsule 1  . potassium chloride SA (KLOR-CON) 20 MEQ tablet TAKE 1  BY MOUTH TWICE DAILY 180 tablet 0  . Suvorexant (BELSOMRA) 15 MG TABS Take 1 tablet by mouth at bedtime. 30 tablet 3  . CVS B-1 100 MG tablet TAKE 1 TABLET BY MOUTH EVERY DAY 100 tablet 1  . CVS VITAMIN B12 1000 MCG TBCR Take 2 tablets by mouth daily.    . irbesartan (AVAPRO) 300 MG tablet Take 150 mg by mouth daily.     [name] oxyCODONE-acetaminophen (PERCOCET/ROXICET) 5-325 MG tablet Take 1 tablet by mouth every 8 (eight) hours as needed for severe pain. 90 tablet 0   No facility-administered  medications prior to visit.    ROS Review of Systems  Constitutional: Positive for unexpected weight change (wt gain). Negative for appetite change, diaphoresis and fatigue.  HENT: Negative.   Eyes: Negative.   Respiratory: Negative for cough, chest tightness, shortness of breath and wheezing.   Cardiovascular: Negative for chest pain, palpitations and leg swelling.  Gastrointestinal: Negative for abdominal pain, constipation, diarrhea, nausea and vomiting.  Endocrine: Positive for polyuria. Negative for polydipsia and polyphagia.  Genitourinary: Positive for frequency and urgency. Negative for decreased urine volume, difficulty urinating, dysuria and hematuria.  Musculoskeletal: Positive for arthralgias and back pain. Negative for  myalgias.  Skin: Negative.  Negative for color change and pallor.  Neurological: Negative.  Negative for dizziness, weakness, light-headedness and headaches.  Hematological: Negative for adenopathy. Does not bruise/bleed easily.  Psychiatric/Behavioral: Negative.     Objective:  BP (!) 158/88 (BP Location: Left Arm, Patient Position: Sitting, Cuff Size: Large)   Pulse 80   Temp 98.2 F (36.8 C) (Oral)   Resp 16   Ht '5\' 6"'$  (1.676 m)   Wt 259 lb 8 oz (117.7 kg)   SpO2 98%   BMI 41.88 kg/m   BP Readings from Last 3 Encounters:  03/27/20 (!) 158/88  02/21/20 (!) 172/96  10/10/19 (!) 144/86    Wt Readings from Last 3 Encounters:  03/27/20 259 lb 8 oz (117.7 kg)  02/21/20 252 lb (114.3 kg)  10/10/19 254 lb (115.2 kg)    Physical Exam Vitals reviewed.  Constitutional:      Appearance: She is obese.  HENT:     Nose: Nose normal.     Mouth/Throat:     Mouth: Mucous membranes are moist.  Eyes:     General: No scleral icterus.    Conjunctiva/sclera: Conjunctivae normal.  Cardiovascular:     Rate and Rhythm: Normal rate and regular rhythm.     Heart sounds: No murmur.  Pulmonary:     Effort: Pulmonary effort is normal.     Breath sounds: No stridor. No wheezing, rhonchi or rales.  Abdominal:     General: Abdomen is protuberant. Bowel sounds are normal. There is no distension.     Palpations: Abdomen is soft. There is no hepatomegaly, splenomegaly or mass.     Tenderness: There is no abdominal tenderness.  Musculoskeletal:        General: Normal range of motion.     Cervical back: Neck supple.     Right lower leg: No edema.     Left lower leg: No edema.  Lymphadenopathy:     Cervical: No cervical adenopathy.  Skin:    General: Skin is warm.  Neurological:     General: No focal deficit present.     Mental Status: She is alert and oriented to person, place, and time.  Psychiatric:        Mood and Affect: Mood normal.        Behavior: Behavior normal.     Lab  Results  Component Value Date   WBC 9.2 03/27/2020   HGB 11.0 (L) 03/27/2020   HCT 33.0 (L) 03/27/2020   PLT 303.0 03/27/2020   GLUCOSE 126 (H) 03/27/2020   CHOL 144 02/21/2020   TRIG 272.0 (H) 02/21/2020   HDL 47.30 02/21/2020   LDLDIRECT 57.0 02/21/2020   LDLCALC 43 03/28/2014   ALT 18 02/21/2020   AST 18 02/21/2020   NA 139 03/27/2020   K 4.0 03/27/2020   CL 101 03/27/2020   CREATININE 0.83  03/27/2020   BUN 11 03/27/2020   CO2 30 03/27/2020   TSH 3.08 02/21/2020   INR 1.0 02/21/2020   HGBA1C 7.3 (H) 02/21/2020   MICROALBUR 0.8 05/04/2019    MYOCARDIAL PERFUSION IMAGING  Result Date: 10/07/2018  Nuclear stress EF: 75%.  Normal perfusion  The study is normal.  This is a low risk study.     Assessment & Plan:   Valerie [name] was seen today for hypertension, anemia and back pain.  Diagnoses and all orders for this visit:  Essential hypertension, benign- Her blood pressure is not adequately well controlled.  I recommended that she continue the current antihypertensives and to improve her lifestyle modifications. -     Basic metabolic panel; Future -     Magnesium; Future -     Magnesium -     Basic metabolic panel  Thiamine deficiency, unspecified- She continues to be anemic and admits that she is not consistently compliant with the thiamine supplement.  I have asked her to take the thiamine supplement more consistently. -     CBC with Differential/Platelet; Future -     CBC with Differential/Platelet -     thiamine (CVS B-1) 100 MG tablet; Take 1 tablet (100 mg total) by mouth daily.  Diuretic-induced hypokalemia- Her potassium level is normal now. -     Basic metabolic panel; Future -     Magnesium; Future -     Magnesium -     Basic metabolic panel  Spinal stenosis of lumbar 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 -     Ambulatory referral to Sports Medicine -     oxyCODONE-acetaminophen (PERCOCET/ROXICET) 5-325 MG tablet; Take 1 tablet by mouth every 8 (eight) hours as  needed for severe pain.  OAB (overactive bladder) -     mirabegron ER (MYRBETRIQ) 25 MG TB24 tablet; Take 1 tablet (25 mg total) by mouth daily.  Type II diabetes mellitus with manifestations (HCC) - Her A1c is at 7.3%.  I have asked her to start using a GLP-1 agonist to help lower her blood sugar and to help her lose weight. -     Dulaglutide (TRULICITY) [id number] 0000000 SOPN; Inject 0.75 mg into the skin once a week.  Primary osteoarthritis involving multiple joints -     oxyCODONE-acetaminophen (PERCOCET/ROXICET) 5-325 MG tablet; Take 1 tablet by mouth every 8 (eight) hours as needed for severe pain.  Morbid obesity with BMI of 45.0-49.9, adult ([place])- I recommended she start using a GLP-1 agonist.  Will increase the dose over time to help her lose weight.   I have discontinued Valerie [name] CVS Vitamin B12. I have also changed her CVS B-1 to thiamine. Additionally, I am having her start on mirabegron ER and Trulicity. Lastly, I am having her maintain her latanoprost, aspirin, albuterol, albuterol, Insulin Pen Needle, glucose blood, Fluocinonide Emulsified Base, atorvastatin, Vitamin D3, gabapentin, chlorthalidone, omeprazole, cyanocobalamin, potassium chloride SA, Synjardy, carvedilol, ferrous sulfate, Belsomra, and oxyCODONE-acetaminophen.  Meds ordered this encounter  Medications  . mirabegron ER (MYRBETRIQ) 25 MG TB24 tablet    Sig: Take 1 tablet (25 mg total) by mouth daily.    Dispense:  90 tablet    Refill:  0  . Dulaglutide (TRULICITY) [id number] [id number] SOPN    Sig: Inject 0.75 mg into the skin once a week.    Dispense:  4 pen    Refill:  0  . oxyCODONE-acetaminophen (PERCOCET/ROXICET) 5-325 MG tablet    Sig: Take 1 tablet by mouth every 8 (eight)  hours as needed for severe pain.    Dispense:  90 tablet    Refill:  0  . thiamine (CVS B-1) 100 MG tablet    Sig: Take 1 tablet (100 mg total) by mouth daily.    Dispense:  100 tablet    Refill:  1   I spent 60 minutes in  preparing to see the patient by review of recent labs, imaging and procedures, obtaining and reviewing separately obtained history, communicating with the patient and family or caregiver, ordering medications, tests or procedures, and documenting clinical information in the EHR including the differential Dx, treatment, and any further evaluation and other management of 1. Essential hypertension, benign 2. Thiamine deficiency, unspecified 3. Diuretic-induced hypokalemia 4. Spinal stenosis of lumbar 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 5. OAB (overactive bladder) 6. Type II diabetes mellitus with manifestations ([place]) 7. Primary osteoarthritis involving multiple joints 8. Morbid obesity with BMI of 45.0-49.9, adult ([place])      Follow-up: Return in about 3 months (around 06/26/2020).  [name], MD

## 2020-03-28 LAB — BASIC METABOLIC PANEL
BUN: 11 mg/dL (ref 6–23)
CO2: 30 mEq/L (ref 19–32)
Calcium: 9.5 mg/dL (ref 8.4–10.5)
Chloride: 101 mEq/L (ref 96–112)
Creatinine, Ser: 0.83 mg/dL (ref 0.40–1.20)
GFR: 81.75 mL/min (ref >60.00)
Glucose, Bld: 126 mg/dL — ABNORMAL HIGH (ref 70–99)
Potassium: 4 mEq/L (ref 3.5–5.1)
Sodium: 139 mEq/L (ref 135–145)

## 2020-03-28 LAB — CBC WITH DIFFERENTIAL/PLATELET
Basophils Absolute: 0.1 10*3/uL (ref 0.0–0.1)
Basophils Relative: 1.1 % (ref 0.0–3.0)
Eosinophils Absolute: 0.4 10*3/uL (ref 0.0–0.7)
Eosinophils Relative: 4.5 % (ref 0.0–5.0)
HCT: 33 % — ABNORMAL LOW (ref 36.0–46.0)
Hemoglobin: 11 g/dL — ABNORMAL LOW (ref 12.0–15.0)
Lymphocytes Relative: 33.7 % (ref 12.0–46.0)
Lymphs Abs: 3.1 10*3/uL (ref 0.7–4.0)
MCHC: 33.3 g/dL (ref 30.0–36.0)
MCV: 89.9 fl (ref 78.0–100.0)
Monocytes Absolute: 0.8 10*3/uL (ref 0.1–1.0)
Monocytes Relative: 9 % (ref 3.0–12.0)
Neutro Abs: 4.7 10*3/uL (ref 1.4–7.7)
Neutrophils Relative %: 51.7 % (ref 43.0–77.0)
Platelets: 303 10*3/uL (ref 150.0–400.0)
RBC: 3.67 Mil/uL — ABNORMAL LOW (ref 3.87–5.11)
RDW: 15.7 % — ABNORMAL HIGH (ref 11.5–15.5)
WBC: 9.2 10*3/uL (ref 4.0–10.5)

## 2020-03-28 LAB — MAGNESIUM: Magnesium: 1.5 mg/dL (ref 1.5–2.5)

## 2020-03-29 ENCOUNTER — Other Ambulatory Visit: Payer: Self-pay

## 2020-03-29 ENCOUNTER — Other Ambulatory Visit: Payer: Self-pay | Admitting: Pharmacy Technician

## 2020-03-29 DIAGNOSIS — I10 Essential (primary) hypertension: Secondary | ICD-10-CM

## 2020-03-29 MED ORDER — THIAMINE HCL 100 MG PO TABS: 100.0000 mg | ORAL_TABLET | Freq: Every day | ORAL | 1 refills | Status: DC

## 2020-03-29 MED ORDER — IRBESARTAN 300 MG PO TABS: 150.0000 mg | ORAL_TABLET | Freq: Every day | ORAL | 1 refills | Status: DC

## 2020-03-29 NOTE — Patient Outreach (Signed)
[facility]) Care Management  03/29/2020  Valerie [name] [birth year] [id number]   Care coordination call placed to [employer] in regards to patient's Proventil application.  Spoke to [place] who informed patient was APPROVED 03/15/2020-12/13/2020. She informed she would have to connect me to [facility] concerning shipment.  Spoke to [place] at [facility] who informed the medication was leaving the warehouse today with [facility] and should arrive at the patient's home in the next 5-7 business days.  Will follow up with patient in 5-7 business days to confirm receipt of medication.  [name] P. Porfirio Bollier, [place]  [phone]

## 2020-04-01 ENCOUNTER — Other Ambulatory Visit: Payer: Self-pay | Admitting: Pharmacist

## 2020-04-01 DIAGNOSIS — G4733 Obstructive sleep apnea (adult) (pediatric): Secondary | ICD-10-CM | POA: Diagnosis not present

## 2020-04-01 NOTE — Patient Outreach (Signed)
[facility] ([facility])  [facility] Quality Pharmacy Team    [facility] pharmacy case will be closed as our team is transitioning from the [facility] Care Management Department into the [facility] Quality Department and will no longer be using [facility] for documentation purposes.   [facility] pharmacy technician will continue to assist patient with medication assistance program applications until complete.       Jovanka Westgate J. Andrick Rust, PharmD, BCACP [facility] Clinical Pharmacist [phone]   

## 2020-04-02 ENCOUNTER — Ambulatory Visit: Payer: Self-pay | Admitting: Pharmacist

## 2020-04-08 ENCOUNTER — Ambulatory Visit: Payer: HMO | Admitting: Internal Medicine

## 2020-04-08 ENCOUNTER — Other Ambulatory Visit: Payer: Self-pay | Admitting: Pharmacy Technician

## 2020-04-08 NOTE — Patient Outreach (Signed)
[facility]) Care Management  04/08/2020  Valerie [name] [birth year] [id number]    Unsuccessful call placed to patient regarding patient assistance medication delivery of Proventil HFA with [employer] , HIPAA compliant voicemail left.   Was calling patient to inquire if she has received the medication and to discuss the refill process.  Follow up:  Will attempt 2nd outreach call in 3-5 business days if call is not returned.  Mignon Bechler P. Cheynne Virden, [place]  [phone]

## 2020-04-08 NOTE — Patient Outreach (Signed)
Valerie [name], Valerie [name]  04/08/2020  Valerie [name] [birth year] [id number]   Care coordination call placed to [employer] in regards to delivery status of patient's Proventil HFA.  Spoke to [place] who informed the medication was delivered on 04/01/2020 at 12:45pm at side door.  Will follow up with patient concerning this information.  Azile Minardi P. Alcus Bradly, [place]  [phone]

## 2020-04-09 ENCOUNTER — Ambulatory Visit (INDEPENDENT_AMBULATORY_CARE_PROVIDER_SITE_OTHER): Payer: HMO

## 2020-04-09 ENCOUNTER — Ambulatory Visit (INDEPENDENT_AMBULATORY_CARE_PROVIDER_SITE_OTHER): Payer: HMO | Admitting: Family Medicine

## 2020-04-09 ENCOUNTER — Other Ambulatory Visit: Payer: Self-pay

## 2020-04-09 ENCOUNTER — Telehealth: Payer: Self-pay

## 2020-04-09 ENCOUNTER — Encounter: Payer: Self-pay | Admitting: Family Medicine

## 2020-04-09 VITALS — BP 130/80 | HR 88 | Ht 66.0 in | Wt 257.0 lb

## 2020-04-09 DIAGNOSIS — M549 Dorsalgia, unspecified: Secondary | ICD-10-CM

## 2020-04-09 DIAGNOSIS — R635 Abnormal weight gain: Secondary | ICD-10-CM | POA: Diagnosis not present

## 2020-04-09 DIAGNOSIS — I10 Essential (primary) hypertension: Secondary | ICD-10-CM

## 2020-04-09 DIAGNOSIS — I493 Ventricular premature depolarization: Secondary | ICD-10-CM

## 2020-04-09 DIAGNOSIS — G8929 Other chronic pain: Secondary | ICD-10-CM | POA: Diagnosis not present

## 2020-04-09 DIAGNOSIS — M545 Low back pain: Secondary | ICD-10-CM | POA: Diagnosis not present

## 2020-04-09 DIAGNOSIS — M48061 Spinal stenosis, lumbar region without neurogenic claudication: Secondary | ICD-10-CM

## 2020-04-09 MED ORDER — GABAPENTIN 400 MG PO CAPS: 400.0000 mg | ORAL_CAPSULE | Freq: Three times a day (TID) | ORAL | 0 refills | Status: DC

## 2020-04-09 MED ORDER — KETOROLAC TROMETHAMINE 60 MG/2ML IM SOLN
60.0000 mg | Freq: Once | INTRAMUSCULAR | Status: AC
  Administered 2020-04-09: 60 mg via INTRAMUSCULAR

## 2020-04-09 NOTE — Addendum Note (Signed)
Addended by: Pollyann Glen on: 04/09/2020 01:39 PM   Modules accepted: Orders

## 2020-04-09 NOTE — Telephone Encounter (Signed)
Pt informed of results for the Event Monitor. Pt will see cardiology.   New referral has been entered.

## 2020-04-09 NOTE — Patient Instructions (Addendum)
Good to see you Sorry for your loss Gabapentin 400 3 times a day Calcium pyruvate 1500 mg daily Continue other vitamins Start exercises 3 times a week This will take a long time but you will do well Xray on the way out See me again in 6 weeks

## 2020-04-09 NOTE — Assessment & Plan Note (Signed)
Known history of spinal stenosis with foraminal stenosis noted on last MRI in 2015.  Worsening pain at this time.  Patient has gained significant weight and will refer to healthy weight and wellness that I think will be beneficial for this individual.  Immunization advice we discussed icing regimen and home exercise, which activities to do which wants to avoid.  Discussed proper shoes.  Increase gabapentin to 400 mg 3 times a day.  Discussed vitamin D supplementation.  Patient will then follow-up with me again in 6 weeks.  Worsening symptoms consider advanced imaging as well as potentially physical therapy.

## 2020-04-09 NOTE — Progress Notes (Signed)
[place] [address] [place] Phone: [phone] Subjective:   I Valerie [name] am serving as a Education administrator for Dr. Hulan Saas.  This visit occurred during the SARS-CoV-2 public health emergency.  Safety protocols were in place, including screening questions prior to the visit, additional usage of staff PPE, and extensive cleaning of exam room while observing appropriate contact time as indicated for disinfecting solutions.   I'm seeing this patient by the request  of:  Janith Lima, MD  CC: Low back pain  [id number]  Valerie [name] is a 74 y.o. female coming in with complaint of back pain. Patient states she has numbness and tingling in the left foot.   Onset- Chronic  Location - lower back, left sided hip pain Character- achy  Aggravating factors- bending and kneeling  Reliving factors-  Therapies tried- ice, heat, oral medications  Severity- 10/10 at its worse  Patient has history of an MRI in 2015.  This was independently visualized by me showing the patient did have mild to moderate spinal stenosis at multiple levels as well as foraminal stenosis especially at L5-S1.  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 Anemia   . Anxiety and depression   . Asthma   . Degenerative arthritis   . Depression   . Diabetes mellitus, type 2 ([place])   . Fatigue   . GERD (gastroesophageal reflux disease)   . Glaucoma   . Hemorrhoids   . Hyperlipidemia   . Hypertension   . Hypothyroid   . Hypoxemia 11/24/2013  . Memory deficit 10/04/2013  . Obesity   . Sleep apnea   . Snoring disorder    Past Surgical History:  Procedure Laterality Date  . benign tumors resected    . CATARACT EXTRACTION Left   . FOOT SURGERY Left    bone spur  . REFRACTIVE SURGERY     Glaucoma  . ROTATOR CUFF REPAIR    .  TUBAL LIGATION     Social History   Socioeconomic History  . Marital status: Widowed    Spouse name: Not on file  . Number of children: 3  . Years of education: 11th  . Highest education level: 11th grade  Occupational History  . Occupation: disabled  Tobacco Use  . Smoking status: Never Smoker  . Smokeless tobacco: Never Used  Substance and Sexual Activity  . Alcohol use: No    Alcohol/week: 0.0 standard drinks  . Drug use: No  . Sexual activity: Not Currently    Comment: not working, lives with husband, 3 sons  Other Topics Concern  . Not on file  Social History Narrative   Client's spouse recently passed Feb 2021    also caregiver for son who is 47 yo and is disabled.   Social Determinants of Health   Financial Resource Strain: Low Risk   . Difficulty of Paying Living Expenses: Not hard at all  Food Insecurity: No Food Insecurity  . Worried About Charity fundraiser in the Last Year: Never true  . Ran Out of Food in the Last Year: Never true  Transportation Needs: No Transportation Needs  . Lack of Transportation (Medical): No  . Lack of Transportation (Non-Medical): No  Physical Activity: Inactive  . Days of Exercise per Week: 0 days  . Minutes  of Exercise per Session: 0 min  Stress: Stress Concern Present  . Feeling of Stress : Rather much  Social Connections: Slightly Isolated  . Frequency of Communication with Friends and Family: More than three times a week  . Frequency of Social Gatherings with Friends and Family: More than three times a week  . Attends Religious Services: More than 4 times per year  . Active Member of Clubs or Organizations: Yes  . Attends Archivist Meetings: More than 4 times per year  . Marital Status: Widowed   Allergies  Allergen Reactions  . Food Swelling    bananas  . Penicillins Swelling and Rash   Family History  Problem Relation Age of Onset  . Alcohol abuse Mother   . Heart attack Mother   . Coronary artery  disease Brother   . Heart attack Father   . Heart disease Sister   . Atrial fibrillation Sister   . Hypertension Sister   . Hypertension Other        family history  . Alcohol abuse Other     Current Outpatient Medications (Endocrine & Metabolic):  [name]  Dulaglutide (TRULICITY) A999333 0000000 SOPN, Inject 0.75 mg into the skin once a week. .  Empagliflozin-metFORMIN HCl (SYNJARDY) 12.5-500 MG TABS, Take 1 tablet by mouth 2 (two) times daily.  Current Outpatient Medications (Cardiovascular):  .  atorvastatin (LIPITOR) 20 MG tablet, TAKE 1 TABLET BY MOUTH EVERY DAY .  carvedilol (COREG) 25 MG tablet, TAKE 1 TABLET BY MOUTH TWICE DAILY WITH MEALS .  chlorthalidone (HYGROTON) 25 MG tablet, Take 1 tablet (25 mg total) by mouth daily. .  irbesartan (AVAPRO) 300 MG tablet, Take 0.5 tablets (150 mg total) by mouth daily.  Current Outpatient Medications (Respiratory):  .  albuterol (PROVENTIL) (2.5 MG/3ML) 0.083% nebulizer solution,  .  albuterol (VENTOLIN HFA) 108 (90 Base) MCG/ACT inhaler, Inhale 2 puffs into the lungs every 6 (six) hours as needed. For shortness of breath  Current Outpatient Medications (Analgesics):  .  aspirin 81 MG tablet, Take 81 mg by mouth daily. [name]  oxyCODONE-acetaminophen (PERCOCET/ROXICET) 5-325 MG tablet, Take 1 tablet by mouth every 8 (eight) hours as needed for severe pain.  Current Outpatient Medications (Hematological):  .  cyanocobalamin 2000 MCG tablet, Take 1 tablet (2,000 mcg total) by mouth daily. .  ferrous sulfate 325 (65 FE) MG tablet, TAKE 1 TABLET BY MOUTH TWICE DAILY WITH A MEAL  Current Outpatient Medications (Other):  [name]  Cholecalciferol (VITAMIN D3) 1.25 MG (50000 UT) CAPS, Take 1 capsule by mouth once a week .  Fluocinonide Emulsified Base 0.05 % CREA, APPLY  CREAM EXTERNALLY TO AFFECTED AREA TWICE DAILY .  gabapentin (NEURONTIN) 300 MG capsule, TAKE 1 CAPSULE BY MOUTH THREE TIMES DAILY .  glucose blood (FREESTYLE TEST STRIPS) test strip, Use to  test blood sugar 3 times a day. DX: E11.8 .  Insulin Pen Needle 31G X 5 MM MISC, Use once daily with insulin .  latanoprost (XALATAN) 0.005 % ophthalmic solution, Place 1 drop into both eyes at bedtime. .  mirabegron ER (MYRBETRIQ) 25 MG TB24 tablet, Take 1 tablet (25 mg total) by mouth daily. [name]  omeprazole (PRILOSEC) 40 MG capsule, Take 1 capsule (40 mg total) by mouth daily. .  potassium chloride SA (KLOR-CON) 20 MEQ tablet, TAKE 1  BY MOUTH TWICE DAILY .  Suvorexant (BELSOMRA) 15 MG TABS, Take 1 tablet by mouth at bedtime. .  thiamine (CVS B-1) 100 MG tablet, Take 1 tablet (  100 mg total) by mouth daily. [name]  gabapentin (NEURONTIN) 400 MG capsule, Take 1 capsule (400 mg total) by mouth 3 (three) times daily.   Reviewed prior external information including notes and imaging from  primary care provider As well as notes that were available from care everywhere and other healthcare systems.  Past medical history, social, surgical and family history all reviewed in electronic medical record.  No pertanent information unless stated regarding to the chief complaint.   Review of Systems:  No headache, visual changes, nausea, vomiting, diarrhea, constipation, dizziness, abdominal pain, skin rash, fevers, chills, night sweats, weight loss, swollen lymph nodes,  chest pain, shortness of breath, mood changes. POSITIVE muscle aches joint swelling, body aches  Objective  Blood pressure 130/80, pulse 88, height 5\' 6"  (1.676 m), weight 257 lb (116.6 kg), SpO2 98 %.   General: No apparent distress alert and oriented x3 mood and affect normal, dressed appropriately.  HEENT: Pupils equal, extraocular movements intact  Respiratory: Patient's speak in full sentences and does not appear short of breath  Cardiovascular: Trace lower extremity edema, non tender, no erythema  Neuro: Cranial nerves II through XII are intact, neurovascularly intact in all extremities with 2+ DTRs and 2+ pulses.  Gait  antalgic Patient's back exam is difficult to assess secondary to patient's body habitus.  Patient has significant tightness with straight leg test.  Tender to palpation in the paraspinal musculature lumbar spine mostly around the L5-S1 area bilaterally.  [id number]; 15 additional minutes spent for Therapeutic exercises as stated in above notes.  This included exercises focusing on stretching, strengthening, with significant focus on eccentric aspects.   Long term goals include an improvement in range of motion, strength, endurance as well as avoiding reinjury. Patient's frequency would include in 1-2 times a day, 3-5 times a week for a duration of 6-12 weeks. Low back exercises that included:  Pelvic tilt/bracing instruction to focus on control of the pelvic girdle and lower abdominal muscles  Glute strengthening exercises, focusing on proper firing of the glutes without engaging the low back muscles Proper stretching techniques for maximum relief for the hamstrings, hip flexors, low back and some rotation where tolerated    Proper technique shown and discussed handout in great detail with ATC.  All questions were discussed and answered.     Impression and Recommendations:     This case required medical decision making of moderate complexity. The above documentation has been reviewed and is accurate and complete Lyndal Pulley, DO       Note: This dictation was prepared with Dragon dictation along with smaller phrase technology. Any transcriptional errors that result from this process are unintentional.

## 2020-04-15 ENCOUNTER — Other Ambulatory Visit: Payer: Self-pay | Admitting: Pharmacy Technician

## 2020-04-15 DIAGNOSIS — L84 Corns and callosities: Secondary | ICD-10-CM | POA: Diagnosis not present

## 2020-04-15 DIAGNOSIS — M19071 Primary osteoarthritis, right ankle and foot: Secondary | ICD-10-CM | POA: Diagnosis not present

## 2020-04-15 DIAGNOSIS — M2042 Other hammer toe(s) (acquired), left foot: Secondary | ICD-10-CM | POA: Diagnosis not present

## 2020-04-15 DIAGNOSIS — M2041 Other hammer toe(s) (acquired), right foot: Secondary | ICD-10-CM | POA: Diagnosis not present

## 2020-04-15 DIAGNOSIS — M19072 Primary osteoarthritis, left ankle and foot: Secondary | ICD-10-CM | POA: Diagnosis not present

## 2020-04-15 DIAGNOSIS — E1351 Other specified diabetes mellitus with diabetic peripheral angiopathy without gangrene: Secondary | ICD-10-CM | POA: Diagnosis not present

## 2020-04-15 NOTE — Patient Outreach (Signed)
[facility]) Care Management  04/15/2020  Valerie [name] 1946/01/02 [id number]  2nd unsuccessful call placed to patient regarding patient assistance medication delivery of Proventil HFA with [employer].  HIPAA compliant voicemail left again.  It was confirmed on 04/08/2020 with [employer] representative Hoyle Sauer that the medication had successfully been delivered on 04/01/2020 at 12:45pm at the side door.  Will close pharmacy assistance case at this time as the process is completed and medication delivery was confirmed with the patient assistance company. Will also remove myself from care team.  [name]. [name], [name]  [phone]

## 2020-04-17 ENCOUNTER — Other Ambulatory Visit: Payer: Self-pay | Admitting: Internal Medicine

## 2020-04-17 DIAGNOSIS — E118 Type 2 diabetes mellitus with unspecified complications: Secondary | ICD-10-CM

## 2020-04-17 MED ORDER — TRULICITY 1.5 MG/0.5ML ~~LOC~~ SOAJ: 1.5000 mg | SUBCUTANEOUS | 0 refills | Status: DC

## 2020-04-23 ENCOUNTER — Encounter: Payer: Self-pay | Admitting: Internal Medicine

## 2020-04-25 DIAGNOSIS — B373 Candidiasis of vulva and vagina: Secondary | ICD-10-CM | POA: Diagnosis not present

## 2020-04-26 DIAGNOSIS — E1351 Other specified diabetes mellitus with diabetic peripheral angiopathy without gangrene: Secondary | ICD-10-CM | POA: Diagnosis not present

## 2020-04-26 DIAGNOSIS — L853 Xerosis cutis: Secondary | ICD-10-CM | POA: Diagnosis not present

## 2020-04-26 DIAGNOSIS — M792 Neuralgia and neuritis, unspecified: Secondary | ICD-10-CM | POA: Diagnosis not present

## 2020-04-26 DIAGNOSIS — I872 Venous insufficiency (chronic) (peripheral): Secondary | ICD-10-CM | POA: Diagnosis not present

## 2020-04-26 DIAGNOSIS — I739 Peripheral vascular disease, unspecified: Secondary | ICD-10-CM | POA: Diagnosis not present

## 2020-04-26 DIAGNOSIS — R609 Edema, unspecified: Secondary | ICD-10-CM | POA: Diagnosis not present

## 2020-04-26 DIAGNOSIS — M79672 Pain in left foot: Secondary | ICD-10-CM | POA: Diagnosis not present

## 2020-04-30 ENCOUNTER — Encounter: Payer: Self-pay | Admitting: Cardiovascular Disease

## 2020-04-30 ENCOUNTER — Ambulatory Visit: Payer: HMO | Admitting: Cardiovascular Disease

## 2020-04-30 ENCOUNTER — Other Ambulatory Visit: Payer: Self-pay

## 2020-04-30 VITALS — BP 122/74 | HR 86 | Ht 66.0 in | Wt 252.4 lb

## 2020-04-30 DIAGNOSIS — R0789 Other chest pain: Secondary | ICD-10-CM

## 2020-04-30 DIAGNOSIS — R002 Palpitations: Secondary | ICD-10-CM | POA: Diagnosis not present

## 2020-04-30 DIAGNOSIS — E785 Hyperlipidemia, unspecified: Secondary | ICD-10-CM

## 2020-04-30 DIAGNOSIS — G4733 Obstructive sleep apnea (adult) (pediatric): Secondary | ICD-10-CM | POA: Diagnosis not present

## 2020-04-30 DIAGNOSIS — R0602 Shortness of breath: Secondary | ICD-10-CM | POA: Diagnosis not present

## 2020-04-30 NOTE — Assessment & Plan Note (Signed)
History of labile hypertension blood pressure measured at 122/74.  She is on carvedilol, chlorthalidone and Avapro.  She says that occasionally when she takes a whole Avapro her blood pressure drops too low.

## 2020-04-30 NOTE — Assessment & Plan Note (Signed)
History of hyperlipidemia on statin therapy with lipid profile performed 02/21/2020 revealing total cholesterol of 144, LDL 57 HDL 47

## 2020-04-30 NOTE — Patient Instructions (Signed)
Medication Instructions:  Your physician recommends that you continue on your current medications as directed. Please refer to the Current Medication list given to you today.  *If you need a refill on your cardiac medications before your next appointment, please call your pharmacy*  Lab Work: NONE ordered at this time of appointment   If you have labs (blood work) drawn today and your tests are completely normal, you will receive your results only by: [name] MyChart Message (if you have MyChart) OR . A paper copy in the mail If you have any lab test that is abnormal or we need to change your treatment, we will call you to review the results.  Testing/Procedures: NONE ordered at this time of appointment   Follow-Up: At [facility], you and your health needs are our priority.  As part of our continuing mission to provide you with exceptional heart care, we have created designated Provider Care Teams.  These Care Teams include your primary Cardiologist (physician) and Advanced Practice Providers (APPs -  Physician Assistants and Nurse Practitioners) who all work together to provide you with the care you need, when you need it.  We recommend signing up for the patient portal called "MyChart".  Sign up information is provided on this After Visit Summary.  MyChart is used to connect with patients for Virtual Visits (Telemedicine).  Patients are able to view lab/test results, encounter notes, upcoming appointments, etc.  Non-urgent messages can be sent to your provider as well.   To learn more about what you can do with MyChart, go to [email or web address].    Your next appointment:   3 month(s) 6 month(s)   The format for your next appointment:   In Person In Person  Provider:   Kerin Ransom, PA-C, Sande Rives, PA-C or Coletta Memos, [place] Lorretta Harp, MD  Other Instructions

## 2020-04-30 NOTE — Assessment & Plan Note (Signed)
History of palpitations on carvedilol with recent event monitor that showed PVCs and short atrial runs.  When I spoke to her a year ago her palpitations had somewhat improved.  She does admit to drinking caffeine including coffee and soft drinks which I told her to abstain from.

## 2020-04-30 NOTE — Progress Notes (Signed)
04/30/2020 Valerie [name]   [birth year]  [id number]  Primary Physician Valerie Lima, MD Primary Cardiologist: Valerie Harp MD Valerie [name], [place]  HPI:  Valerie [name] is a 74 y.o.  morbidly overweight married African-American female mother 42, grandmother of 5 grandchildren who was in the Valerie [name] and was referred by Valerie [name] for cardiovascular evaluation because of palpitations.  I last look to her on the phone for a virtual telemedicine phone visit 07/19/2019. She has been evaluated by Valerie [name] and Valerie [name] in the past. She saw Dr. Oval [name] a little over 3 years ago which time she had an echo that was essentially normal and a 2-day protocol Myoview that was normal as well. She does have a history of treated hypertension, diabetes and hyperlipidemia. She wears BiPAP for obstructive sleep apnea and his family history for heart disease with both father and brother who died of myocardial infarctions. He is never had a heart attack or stroke. She does get chronic chest pain every several days and is chronically short of breath. She also has reactive airways disease. He started having palpitations several months ago and these occur several times a week lasting seconds to minutes at a time.  She has had a Myoview stress test and 2D echo all of which were normal.  She also had a monitor placed because of palpitations 10/03/2018 that showed sinus rhythm with occasional PVCs and PACs.  Her palpitations have resolved since I last saw her.  She denies chest pain or shortness of breath.  Since I spoke to her on the phone a year ago she has had recurrent palpitations with an event monitor placed 03/01/2020 revealing occasional PVCs with short atrial runs.  She does admit to drinking coffee and sodas.   Current Meds  Medication Sig  . albuterol (PROVENTIL) (2.5 MG/3ML) 0.083% nebulizer solution   . albuterol (VENTOLIN HFA) 108 (90 Base) MCG/ACT inhaler Inhale 2 puffs  into the lungs every 6 (six) hours as needed. For shortness of breath  . aspirin 81 MG tablet Take 81 mg by mouth daily.  [name] atorvastatin (LIPITOR) 20 MG tablet TAKE 1 TABLET BY MOUTH EVERY DAY  . carvedilol (COREG) 25 MG tablet TAKE 1 TABLET BY MOUTH TWICE DAILY WITH MEALS  . chlorthalidone (HYGROTON) 25 MG tablet Take 1 tablet (25 mg total) by mouth daily.  . Cholecalciferol (VITAMIN D3) 1.25 MG (50000 UT) CAPS Take 1 capsule by mouth once a week  . cyanocobalamin 2000 MCG tablet Take 1 tablet (2,000 mcg total) by mouth daily.  . Dulaglutide (TRULICITY) 1.5 0000000 SOPN Inject 1.5 mg into the skin once a week.  . Empagliflozin-metFORMIN HCl (SYNJARDY) 12.5-500 MG TABS Take 1 tablet by mouth 2 (two) times daily.  . ferrous sulfate 325 (65 FE) MG tablet TAKE 1 TABLET BY MOUTH TWICE DAILY WITH A MEAL  . Fluocinonide Emulsified Base 0.05 % CREA APPLY  CREAM EXTERNALLY TO AFFECTED AREA TWICE DAILY  . gabapentin (NEURONTIN) 300 MG capsule TAKE 1 CAPSULE BY MOUTH THREE TIMES DAILY  . gabapentin (NEURONTIN) 400 MG capsule Take 1 capsule (400 mg total) by mouth 3 (three) times daily.  [name] glucose blood (FREESTYLE TEST STRIPS) test strip Use to test blood sugar 3 times a day. DX: E11.8  . Insulin Pen Needle 31G X 5 MM MISC Use once daily with insulin  . irbesartan (AVAPRO) 300 MG tablet Take 0.5 tablets (150 mg total) by mouth daily.  [name] latanoprost (  XALATAN) 0.005 % ophthalmic solution Place 1 drop into both eyes at bedtime.  . mirabegron ER (MYRBETRIQ) 25 MG TB24 tablet Take 1 tablet (25 mg total) by mouth daily.  [name] nystatin ointment (MYCOSTATIN) APPLY ON THE SKIN THREE TIMES DAILY  . omeprazole (PRILOSEC) 40 MG capsule Take 1 capsule (40 mg total) by mouth daily.  [name] oxyCODONE-acetaminophen (PERCOCET/ROXICET) 5-325 MG tablet Take 1 tablet by mouth every 8 (eight) hours as needed for severe pain.  . potassium chloride SA (KLOR-CON) 20 MEQ tablet TAKE 1  BY MOUTH TWICE DAILY  . thiamine ([employer] B-1) 100 MG  tablet Take 1 tablet (100 mg total) by mouth daily.  . [DISCONTINUED] Suvorexant (BELSOMRA) 15 MG TABS Take 1 tablet by mouth at bedtime.     Allergies  Allergen Reactions  . Food Swelling    bananas  . Penicillins Swelling and Rash    Social History   Socioeconomic History  . Marital status: Widowed    Spouse name: Not on file  . Number of children: 3  . Years of education: 11th  . Highest education level: 11th grade  Occupational History  . Occupation: disabled  Tobacco Use  . Smoking status: Never Smoker  . Smokeless tobacco: Never Used  Substance and Sexual Activity  . Alcohol use: No    Alcohol/week: 0.0 standard drinks  . Drug use: No  . Sexual activity: Not Currently    Comment: not working, lives with husband, 3 sons  Other Topics Concern  . Not on file  Social History Narrative   Client's spouse recently passed Feb 2021    also caregiver for son who is 36 yo and is disabled.   Social Determinants of Health   Financial Resource Strain: Low Risk   . Difficulty of Paying Living Expenses: Not hard at all  Food Insecurity: No Food Insecurity  . Worried About Charity fundraiser in the Last Year: Never true  . Ran Out of Food in the Last Year: Never true  Transportation Needs: No Transportation Needs  . Lack of Transportation (Medical): No  . Lack of Transportation (Non-Medical): No  Physical Activity: Inactive  . Days of Exercise per Week: 0 days  . Minutes of Exercise per Session: 0 min  Stress: Stress Concern Present  . Feeling of Stress : Rather much  Social Connections: Slightly Isolated  . Frequency of Communication with Friends and Family: More than three times a week  . Frequency of Social Gatherings with Friends and Family: More than three times a week  . Attends Religious Services: More than 4 times per year  . Active Member of Clubs or Organizations: Yes  . Attends Archivist Meetings: More than 4 times per year  . Marital Status:  Widowed  Intimate Partner Violence: Not At Risk  . Fear of Current or Ex-Partner: No  . Emotionally Abused: No  . Physically Abused: No  . Sexually Abused: No     Review of Systems: General: negative for chills, fever, night sweats or weight changes.  Cardiovascular: negative for chest pain, dyspnea on exertion, edema, orthopnea, palpitations, paroxysmal nocturnal dyspnea or shortness of breath Dermatological: negative for rash Respiratory: negative for cough or wheezing Urologic: negative for hematuria Abdominal: negative for nausea, vomiting, diarrhea, bright red blood per rectum, melena, or hematemesis Neurologic: negative for visual changes, syncope, or dizziness All other systems reviewed and are otherwise negative except as noted above.    Blood pressure 122/74, pulse 86, height 5\' 6"  (  1.676 m), weight 252 lb 6.4 oz (114.5 kg).  General appearance: alert and no distress Neck: no adenopathy, no carotid bruit, no JVD, supple, symmetrical, trachea midline and thyroid not enlarged, symmetric, no tenderness/mass/nodules Lungs: clear to auscultation bilaterally Heart: regular rate and rhythm, S1, S2 normal, no murmur, click, rub or gallop Extremities: extremities normal, atraumatic, no cyanosis or edema Pulses: 2+ and symmetric Skin: Skin color, texture, turgor normal. No rashes or lesions Neurologic: Alert and oriented X 3, normal strength and tone. Normal symmetric reflexes. Normal coordination and gait  EKG sinus rhythm 86 with nonspecific ST and T wave changes.  I personally reviewed this EKG.  ASSESSMENT AND PLAN:   Hyperlipidemia with target LDL less than 100 History of hyperlipidemia on statin therapy with lipid profile performed 02/21/2020 revealing total cholesterol of 144, LDL 57 HDL 47  Essential hypertension, benign History of labile hypertension blood pressure measured at 122/74.  She is on carvedilol, chlorthalidone and Avapro.  She says that occasionally when she  takes a whole Avapro her blood pressure drops too low.  Palpitations History of palpitations on carvedilol with recent event monitor that showed PVCs and short atrial runs.  When I spoke to her a year ago her palpitations had somewhat improved.  She does admit to drinking caffeine including coffee and soft drinks which I told her to abstain from.  Obstructive sleep apnea treated with BiPAP History of obstructive sleep apnea on BiPAP      Valerie Harp MD [facility], [facility] 04/30/2020 4:35 PM

## 2020-04-30 NOTE — Assessment & Plan Note (Signed)
History of obstructive sleep apnea on BiPAP 

## 2020-05-01 DIAGNOSIS — G4733 Obstructive sleep apnea (adult) (pediatric): Secondary | ICD-10-CM | POA: Diagnosis not present

## 2020-05-02 ENCOUNTER — Encounter: Payer: Self-pay | Admitting: Internal Medicine

## 2020-05-02 DIAGNOSIS — Z1231 Encounter for screening mammogram for malignant neoplasm of breast: Secondary | ICD-10-CM | POA: Diagnosis not present

## 2020-05-02 DIAGNOSIS — Z780 Asymptomatic menopausal state: Secondary | ICD-10-CM | POA: Diagnosis not present

## 2020-05-02 LAB — HM DEXA SCAN: HM Dexa Scan: NORMAL

## 2020-05-02 LAB — HM MAMMOGRAPHY

## 2020-05-03 DIAGNOSIS — I739 Peripheral vascular disease, unspecified: Secondary | ICD-10-CM | POA: Diagnosis not present

## 2020-05-07 ENCOUNTER — Encounter: Payer: Self-pay | Admitting: Internal Medicine

## 2020-05-07 DIAGNOSIS — K641 Second degree hemorrhoids: Secondary | ICD-10-CM | POA: Diagnosis not present

## 2020-05-10 ENCOUNTER — Other Ambulatory Visit: Payer: Self-pay | Admitting: Internal Medicine

## 2020-05-10 DIAGNOSIS — E118 Type 2 diabetes mellitus with unspecified complications: Secondary | ICD-10-CM

## 2020-05-10 DIAGNOSIS — I10 Essential (primary) hypertension: Secondary | ICD-10-CM

## 2020-05-11 ENCOUNTER — Other Ambulatory Visit: Payer: Self-pay | Admitting: Internal Medicine

## 2020-05-11 DIAGNOSIS — E118 Type 2 diabetes mellitus with unspecified complications: Secondary | ICD-10-CM

## 2020-05-16 ENCOUNTER — Telehealth: Payer: Self-pay | Admitting: Internal Medicine

## 2020-05-16 ENCOUNTER — Other Ambulatory Visit: Payer: Self-pay | Admitting: Internal Medicine

## 2020-05-16 NOTE — Progress Notes (Signed)
  Chronic Care Management   Outreach Note  05/16/2020 Name: SALONI HEDGEPETH MRN: [id number] DOB: 11/23/1946  Referred by: Janith Lima, MD Reason for referral : No chief complaint on file.   An unsuccessful telephone outreach was attempted today. The patient was referred to the pharmacist for assistance with care management and care coordination.   Follow Up Plan:   [name]

## 2020-05-19 ENCOUNTER — Other Ambulatory Visit: Payer: Self-pay | Admitting: Internal Medicine

## 2020-05-19 DIAGNOSIS — E785 Hyperlipidemia, unspecified: Secondary | ICD-10-CM

## 2020-05-21 ENCOUNTER — Other Ambulatory Visit: Payer: Self-pay

## 2020-05-21 ENCOUNTER — Encounter: Payer: Self-pay | Admitting: Family Medicine

## 2020-05-21 ENCOUNTER — Ambulatory Visit (INDEPENDENT_AMBULATORY_CARE_PROVIDER_SITE_OTHER): Payer: HMO | Admitting: Family Medicine

## 2020-05-21 DIAGNOSIS — M48061 Spinal stenosis, lumbar region without neurogenic claudication: Secondary | ICD-10-CM | POA: Diagnosis not present

## 2020-05-21 MED ORDER — GABAPENTIN 100 MG PO CAPS
200.0000 mg | ORAL_CAPSULE | Freq: Two times a day (BID) | ORAL | 0 refills | Status: DC
Start: 2020-05-21 — End: 2020-08-14

## 2020-05-21 NOTE — Assessment & Plan Note (Signed)
Severe known degenerative disc disease.  Patient secondary to body habitus and social determinants of health with her being the primary caregiver for her handicapped son and she is unable to have any surgical intervention.  Is responding well to the gabapentin.  Discussed potentially increasing the gabapentin to 100 mg twice daily during the day and 200 mg at night.  Prescription sent.  I believe patient should do relatively well.  We discussed many different alternatives including formal physical therapy which patient declined at the moment.  Discussed icing regimen and home exercises.  Discussed the importance of weight loss.  Follow-up again in 4 to 8 weeks.  Total time with patient today as well as reviewing patient's imaging 33 minutes.

## 2020-05-21 NOTE — Progress Notes (Signed)
[place] Phone: [phone] Subjective:   Fontaine No, am serving as a scribe for Dr. Hulan Saas. This visit occurred during the SARS-CoV-2 public health emergency.  Safety protocols were in place, including screening questions prior to the visit, additional usage of staff PPE, and extensive cleaning of exam room while observing appropriate contact time as indicated for disinfecting solutions.   I'm seeing this patient by the request  of:  Janith Lima, MD  CC: Low back pain follow-up  [id number]   04/09/2020 Known history of spinal stenosis with foraminal stenosis noted on last MRI in 2015.  Worsening pain at this time.  Patient has gained significant weight and will refer to healthy weight and wellness that I think will be beneficial for this individual.  Immunization advice we discussed icing regimen and home exercise, which activities to do which wants to avoid.  Discussed proper shoes.  Increase gabapentin to 400 mg 3 times a day.  Discussed vitamin D supplementation.  Patient will then follow-up with me again in 6 weeks.  Worsening symptoms consider advanced imaging as well as potentially physical therapy.  Update 05/21/2020 TOY SAMARIN is a 74 y.o. female coming in with complaint of low back pain. Patient states that she is using gabapentin at night. Does feel slight improvement. Pain is not as intense as it was last visit. Pain is not constant but she does have pain daily at varying intensities.  Patient still having difficulty doing the exercises on her own with her being the primary caregiver for her son's now has another son who lives also having back pain.  Also primary caregiver for her significant other.       Past Medical History:  Diagnosis Date  . Anemia   . Anxiety and depression   . Asthma   . Degenerative arthritis   . Depression   . Diabetes mellitus, type 2 ([place])   . Fatigue     . GERD (gastroesophageal reflux disease)   . Glaucoma   . Hemorrhoids   . Hyperlipidemia   . Hypertension   . Hypothyroid   . Hypoxemia 11/24/2013  . Memory deficit 10/04/2013  . Obesity   . Sleep apnea   . Snoring disorder    Past Surgical History:  Procedure Laterality Date  . benign tumors resected    . CATARACT EXTRACTION Left   . FOOT SURGERY Left    bone spur  . REFRACTIVE SURGERY     Glaucoma  . ROTATOR CUFF REPAIR    . TUBAL LIGATION     Social History   Socioeconomic History  . Marital status: Widowed    Spouse name: Not on file  . Number of children: 3  . Years of education: 11th  . Highest education level: 11th grade  Occupational History  . Occupation: disabled  Tobacco Use  . Smoking status: Never Smoker  . Smokeless tobacco: Never Used  Substance and Sexual Activity  . Alcohol use: No    Alcohol/week: 0.0 standard drinks  . Drug use: No  . Sexual activity: Not Currently    Comment: not working, lives with husband, 3 sons  Other Topics Concern  . Not on file  Social History Narrative   Client's spouse recently passed Feb 2021    also caregiver for son who is 21 yo and is disabled.   Social Determinants of Health   Financial Resource Strain: Low Risk   .  Difficulty of Paying Living Expenses: Not hard at all  Food Insecurity: No Food Insecurity  . Worried About Charity fundraiser in the Last Year: Never true  . Ran Out of Food in the Last Year: Never true  Transportation Needs: No Transportation Needs  . Lack of Transportation (Medical): No  . Lack of Transportation (Non-Medical): No  Physical Activity: Inactive  . Days of Exercise per Week: 0 days  . Minutes of Exercise per Session: 0 min  Stress: Stress Concern Present  . Feeling of Stress : Rather much  Social Connections: Slightly Isolated  . Frequency of Communication with Friends and Family: More than three times a week  . Frequency of Social Gatherings with Friends and Family: More  than three times a week  . Attends Religious Services: More than 4 times per year  . Active Member of Clubs or Organizations: Yes  . Attends Archivist Meetings: More than 4 times per year  . Marital Status: Widowed   Allergies  Allergen Reactions  . Food Swelling    bananas  . Penicillins Swelling and Rash   Family History  Problem Relation Age of Onset  . Alcohol abuse Mother   . Heart attack Mother   . Coronary artery disease Brother   . Heart attack Father   . Heart disease Sister   . Atrial fibrillation Sister   . Hypertension Sister   . Hypertension Other        family history  . Alcohol abuse Other     Current Outpatient Medications (Endocrine & Metabolic):  [name]  Dulaglutide (TRULICITY) 1.5 JJ/9.4RD SOPN, Inject 1.5 mg into the skin once a week. .  Empagliflozin-metFORMIN HCl (SYNJARDY) 12.5-500 MG TABS, Take 1 tablet by mouth 2 (two) times daily.  Current Outpatient Medications (Cardiovascular):  .  atorvastatin (LIPITOR) 20 MG tablet, TAKE 1 TABLET BY MOUTH EVERY DAY .  carvedilol (COREG) 25 MG tablet, TAKE 1 TABLET BY MOUTH TWICE DAILY WITH MEALS .  chlorthalidone (HYGROTON) 25 MG tablet, TAKE 1 TABLET BY MOUTH EVERY DAY .  irbesartan (AVAPRO) 300 MG tablet, Take 0.5 tablets (150 mg total) by mouth daily.  Current Outpatient Medications (Respiratory):  .  albuterol (PROVENTIL) (2.5 MG/3ML) 0.083% nebulizer solution,  .  albuterol (VENTOLIN HFA) 108 (90 Base) MCG/ACT inhaler, Inhale 2 puffs into the lungs every 6 (six) hours as needed. For shortness of breath  Current Outpatient Medications (Analgesics):  .  aspirin 81 MG tablet, Take 81 mg by mouth daily. [name]  oxyCODONE-acetaminophen (PERCOCET/ROXICET) 5-325 MG tablet, Take 1 tablet by mouth every 8 (eight) hours as needed for severe pain.  Current Outpatient Medications (Hematological):  .  cyanocobalamin 2000 MCG tablet, Take 1 tablet (2,000 mcg total) by mouth daily. .  ferrous sulfate 325 (65 FE) MG  tablet, TAKE 1 TABLET BY MOUTH TWICE DAILY WITH A MEAL  Current Outpatient Medications (Other):  [name]  Cholecalciferol (VITAMIN D3) 1.25 MG (50000 UT) CAPS, Take 1 capsule by mouth once a week .  Fluocinonide Emulsified Base 0.05 % CREA, APPLY  CREAM EXTERNALLY TO AFFECTED AREA TWICE DAILY .  gabapentin (NEURONTIN) 300 MG capsule, TAKE 1 CAPSULE BY MOUTH THREE TIMES DAILY .  gabapentin (NEURONTIN) 400 MG capsule, Take 1 capsule (400 mg total) by mouth 3 (three) times daily. [name]  glucose blood (FREESTYLE TEST STRIPS) test strip, USE TO TEST BLOOD SUGAR 3 TIMES A DAY. DX: E11.8 .  Insulin Pen Needle 31G X 5 MM MISC, Use once  daily with insulin .  latanoprost (XALATAN) 0.005 % ophthalmic solution, Place 1 drop into both eyes at bedtime. .  mirabegron ER (MYRBETRIQ) 25 MG TB24 tablet, Take 1 tablet (25 mg total) by mouth daily. [name]  nystatin ointment (MYCOSTATIN), APPLY ON THE SKIN THREE TIMES DAILY .  omeprazole (PRILOSEC) 40 MG capsule, Take 1 capsule (40 mg total) by mouth daily. .  potassium chloride SA (KLOR-CON) 20 MEQ tablet, TAKE 1  BY MOUTH TWICE DAILY .  thiamine (CVS B-1) 100 MG tablet, Take 1 tablet (100 mg total) by mouth daily. [name]  gabapentin (NEURONTIN) 100 MG capsule, Take 2 capsules (200 mg total) by mouth 2 (two) times daily.   Reviewed prior external information including notes and imaging from  primary care provider As well as notes that were available from care everywhere and other healthcare systems.  Past medical history, social, surgical and family history all reviewed in electronic medical record.  No pertanent information unless stated regarding to the chief complaint.   Review of Systems:  No headache, visual changes, nausea, vomiting, diarrhea, constipation, dizziness, abdominal pain, skin rash, fevers, chills, night sweats, weight loss, swollen lymph nodes,  joint swelling, chest pain, shortness of breath, mood changes. POSITIVE muscle aches, body aches  Objective  Blood  pressure 130/78, pulse 92, height 5\' 6"  (1.676 m), weight 252 lb (114.3 kg), SpO2 98 %.   General: No apparent distress alert and oriented x3 mood and affect normal, dressed appropriately.  HEENT: Pupils equal, extraocular movements intact  Respiratory: Patient's speak in full sentences and does not appear short of breath  Cardiovascular: No lower extremity edema, non tender, no erythema  Neuro: Cranial nerves II through XII are intact, neurovascularly intact in all extremities with 2+ DTRs and 2+ pulses.  Gait mild antalgic MSK: Patient back exam some loss of lordosis.  Poor core strength noted.  Tender to palpation diffusely of the lumbar spine.  Tightness with straight leg test.    Impression and Recommendations:     The above documentation has been reviewed and is accurate and complete Lyndal Pulley, DO       Note: This dictation was prepared with Dragon dictation along with smaller phrase technology. Any transcriptional errors that result from this process are unintentional.

## 2020-05-21 NOTE — Patient Instructions (Signed)
I'm here if you need me Gabapentin 1 pill am, 1 pill pm, 2 pills at night Consider PT See me in 6 weeks

## 2020-05-24 DIAGNOSIS — L853 Xerosis cutis: Secondary | ICD-10-CM | POA: Diagnosis not present

## 2020-05-24 DIAGNOSIS — E1351 Other specified diabetes mellitus with diabetic peripheral angiopathy without gangrene: Secondary | ICD-10-CM | POA: Diagnosis not present

## 2020-05-24 DIAGNOSIS — M79672 Pain in left foot: Secondary | ICD-10-CM | POA: Diagnosis not present

## 2020-05-24 DIAGNOSIS — M792 Neuralgia and neuritis, unspecified: Secondary | ICD-10-CM | POA: Diagnosis not present

## 2020-05-27 ENCOUNTER — Ambulatory Visit (INDEPENDENT_AMBULATORY_CARE_PROVIDER_SITE_OTHER): Payer: HMO | Admitting: Internal Medicine

## 2020-05-27 ENCOUNTER — Telehealth: Payer: Self-pay | Admitting: Internal Medicine

## 2020-05-27 ENCOUNTER — Other Ambulatory Visit: Payer: Self-pay

## 2020-05-27 ENCOUNTER — Encounter: Payer: Self-pay | Admitting: Internal Medicine

## 2020-05-27 VITALS — BP 142/70 | HR 92 | Temp 97.9°F | Ht 66.0 in | Wt 251.0 lb

## 2020-05-27 DIAGNOSIS — R1310 Dysphagia, unspecified: Secondary | ICD-10-CM

## 2020-05-27 DIAGNOSIS — E519 Thiamine deficiency, unspecified: Secondary | ICD-10-CM

## 2020-05-27 DIAGNOSIS — I10 Essential (primary) hypertension: Secondary | ICD-10-CM | POA: Diagnosis not present

## 2020-05-27 DIAGNOSIS — R0789 Other chest pain: Secondary | ICD-10-CM | POA: Diagnosis not present

## 2020-05-27 DIAGNOSIS — E118 Type 2 diabetes mellitus with unspecified complications: Secondary | ICD-10-CM

## 2020-05-27 DIAGNOSIS — R1319 Other dysphagia: Secondary | ICD-10-CM | POA: Insufficient documentation

## 2020-05-27 DIAGNOSIS — D510 Vitamin B12 deficiency anemia due to intrinsic factor deficiency: Secondary | ICD-10-CM

## 2020-05-27 DIAGNOSIS — K2100 Gastro-esophageal reflux disease with esophagitis, without bleeding: Secondary | ICD-10-CM | POA: Diagnosis not present

## 2020-05-27 LAB — CBC WITH DIFFERENTIAL/PLATELET
Basophils Absolute: 0.1 10*3/uL (ref 0.0–0.1)
Basophils Relative: 0.4 % (ref 0.0–3.0)
Eosinophils Absolute: 0.3 10*3/uL (ref 0.0–0.7)
Eosinophils Relative: 2.5 % (ref 0.0–5.0)
HCT: 34.4 % — ABNORMAL LOW (ref 36.0–46.0)
Hemoglobin: 11.3 g/dL — ABNORMAL LOW (ref 12.0–15.0)
Lymphocytes Relative: 24.8 % (ref 12.0–46.0)
Lymphs Abs: 2.9 10*3/uL (ref 0.7–4.0)
MCHC: 32.7 g/dL (ref 30.0–36.0)
MCV: 90.7 fl (ref 78.0–100.0)
Monocytes Absolute: 1 10*3/uL (ref 0.1–1.0)
Monocytes Relative: 8.3 % (ref 3.0–12.0)
Neutro Abs: 7.5 10*3/uL (ref 1.4–7.7)
Neutrophils Relative %: 64 % (ref 43.0–77.0)
Platelets: 368 10*3/uL (ref 150.0–400.0)
RBC: 3.8 Mil/uL — ABNORMAL LOW (ref 3.87–5.11)
RDW: 15.9 % — ABNORMAL HIGH (ref 11.5–15.5)
WBC: 11.7 10*3/uL — ABNORMAL HIGH (ref 4.0–10.5)

## 2020-05-27 LAB — BASIC METABOLIC PANEL
BUN: 20 mg/dL (ref 6–23)
CO2: 27 mEq/L (ref 19–32)
Calcium: 10.1 mg/dL (ref 8.4–10.5)
Chloride: 98 mEq/L (ref 96–112)
Creatinine, Ser: 0.91 mg/dL (ref 0.40–1.20)
GFR: 73.48 mL/min (ref >60.00)
Glucose, Bld: 125 mg/dL — ABNORMAL HIGH (ref 70–99)
Potassium: 3.5 mEq/L (ref 3.5–5.1)
Sodium: 137 mEq/L (ref 135–145)

## 2020-05-27 LAB — TROPONIN I (HIGH SENSITIVITY): High Sens Troponin I: 3 ng/L (ref 2–17)

## 2020-05-27 LAB — HEMOGLOBIN A1C: Hgb A1c MFr Bld: 7.4 % — ABNORMAL HIGH (ref 4.6–6.5)

## 2020-05-27 MED ORDER — OMEPRAZOLE 40 MG PO CPDR: 40.0000 mg | DELAYED_RELEASE_CAPSULE | Freq: Every day | ORAL | 0 refills | Status: DC

## 2020-05-27 MED ORDER — OMEPRAZOLE 40 MG PO CPDR: 40.0000 mg | DELAYED_RELEASE_CAPSULE | Freq: Two times a day (BID) | ORAL | 0 refills | Status: DC

## 2020-05-27 MED ORDER — SYNJARDY 12.5-500 MG PO TABS: 1.0000 | ORAL_TABLET | Freq: Two times a day (BID) | ORAL | 3 refills | Status: DC

## 2020-05-27 MED ORDER — TRULICITY 1.5 MG/0.5ML ~~LOC~~ SOAJ: 1.5000 mg | SUBCUTANEOUS | 0 refills | Status: DC

## 2020-05-27 MED ORDER — FAMOTIDINE 40 MG PO TABS: 40.0000 mg | ORAL_TABLET | Freq: Every day | ORAL | 1 refills | Status: DC

## 2020-05-27 NOTE — Progress Notes (Signed)
Subjective:  Patient ID: Valerie [name], female    DOB: 04-07-1946  Age: 74 y.o. MRN: [id number]  CC: Hypertension and Gastroesophageal Reflux  This visit occurred during the SARS-CoV-2 public health emergency.  Safety protocols were in place, including screening questions prior to the visit, additional usage of staff PPE, and extensive cleaning of exam room while observing appropriate contact time as indicated for disinfecting solutions.    HPI Valerie [name] presents for f/up -  She complains of a new onset chest pain over the last week or two. She has had chest pain previously and has been thoroughly evaluated by cardiology and cardiovascular testing. She describes this chest pain as different. She says it is under her sternum and over her left upper chest wall. There is no exertional component. The pain is not pleuritic in nature. The pain does not change with position, movement, or palpation. She describes it as an intermittent sharp and pressure sensation without radiation. She also complains of belching and dysphagia. She denies odynophagia. She is taking a PPI once a day for GERD. She complains of chronic fatigue and shortness of breath.  Outpatient Medications Prior to Visit  Medication Sig Dispense Refill  . albuterol (PROVENTIL) (2.5 MG/3ML) 0.083% nebulizer solution     . albuterol (VENTOLIN HFA) 108 (90 Base) MCG/ACT inhaler Inhale 2 puffs into the lungs every 6 (six) hours as needed. For shortness of breath 3 Inhaler 4  . aspirin 81 MG tablet Take 81 mg by mouth daily.    [name] atorvastatin (LIPITOR) 20 MG tablet TAKE 1 TABLET BY MOUTH EVERY DAY 90 tablet 1  . carvedilol (COREG) 25 MG tablet TAKE 1 TABLET BY MOUTH TWICE DAILY WITH MEALS 180 tablet 0  . chlorthalidone (HYGROTON) 25 MG tablet TAKE 1 TABLET BY MOUTH EVERY DAY 90 tablet 0  . Cholecalciferol (VITAMIN D3) 1.25 MG (50000 UT) CAPS Take 1 capsule by mouth once a week 12 capsule 0  . cyanocobalamin 2000 MCG tablet Take 1  tablet (2,000 mcg total) by mouth daily. 90 tablet 1  . ferrous sulfate 325 (65 FE) MG tablet TAKE 1 TABLET BY MOUTH TWICE DAILY WITH A MEAL 180 tablet 1  . Fluocinonide Emulsified Base 0.05 % CREA APPLY  CREAM EXTERNALLY TO AFFECTED AREA TWICE DAILY 60 g 2  . gabapentin (NEURONTIN) 100 MG capsule Take 2 capsules (200 mg total) by mouth 2 (two) times daily. 120 capsule 0  . gabapentin (NEURONTIN) 300 MG capsule TAKE 1 CAPSULE BY MOUTH THREE TIMES DAILY 270 capsule 0  . gabapentin (NEURONTIN) 400 MG capsule Take 1 capsule (400 mg total) by mouth 3 (three) times daily. 180 capsule 0  . glucose blood (FREESTYLE TEST STRIPS) test strip USE TO TEST BLOOD SUGAR 3 TIMES A DAY. DX: E11.8 100 strip 11  . Insulin Pen Needle 31G X 5 MM MISC Use once daily with insulin 100 each 3  . irbesartan (AVAPRO) 300 MG tablet Take 0.5 tablets (150 mg total) by mouth daily. 90 tablet 1  . latanoprost (XALATAN) 0.005 % ophthalmic solution Place 1 drop into both eyes at bedtime.    . mirabegron ER (MYRBETRIQ) 25 MG TB24 tablet Take 1 tablet (25 mg total) by mouth daily. 90 tablet 0  . nystatin ointment (MYCOSTATIN) APPLY ON THE SKIN THREE TIMES DAILY    . oxyCODONE-acetaminophen (PERCOCET/ROXICET) 5-325 MG tablet Take 1 tablet by mouth every 8 (eight) hours as needed for severe pain. 90 tablet 0  . potassium chloride  SA (KLOR-CON) 20 MEQ tablet TAKE 1  BY MOUTH TWICE DAILY 180 tablet 0  . thiamine ([facility] B-1) 100 MG tablet Take 1 tablet (100 mg total) by mouth daily. 100 tablet 1  . Dulaglutide (TRULICITY) 1.5 WE/3.1VQ SOPN Inject 1.5 mg into the skin once a week. 4 pen 0  . omeprazole (PRILOSEC) 40 MG capsule Take 1 capsule (40 mg total) by mouth daily. 90 capsule 1  . Empagliflozin-metFORMIN HCl (SYNJARDY) 12.5-500 MG TABS Take 1 tablet by mouth 2 (two) times daily. 180 tablet 1   No facility-administered medications prior to visit.    ROS Review of Systems  Constitutional: Positive for fatigue. Negative for chills,  diaphoresis and fever.  HENT: Positive for trouble swallowing. Negative for sore throat and voice change.   Eyes: Negative.   Respiratory: Negative for cough, chest tightness, shortness of breath and wheezing.   Cardiovascular: Positive for chest pain and palpitations. Negative for leg swelling.  Gastrointestinal: Negative for abdominal pain, constipation, diarrhea, nausea and vomiting.  Endocrine: Negative.   Genitourinary: Negative.  Negative for difficulty urinating.  Musculoskeletal: Negative for arthralgias, back pain, myalgias and neck pain.  Skin: Negative.  Negative for color change and pallor.  Neurological: Negative for dizziness, weakness, light-headedness, numbness and headaches.  Hematological: Negative for adenopathy. Does not bruise/bleed easily.  Psychiatric/Behavioral: Negative.     Objective:  BP (!) 142/70 (BP Location: Left Arm, Patient Position: Sitting, Cuff Size: Large)   Pulse 92   Temp 97.9 F (36.6 C) (Oral)   Ht 5\' 6"  (1.676 m)   Wt 251 lb (113.9 kg)   SpO2 97%   BMI 40.51 kg/m   BP Readings from Last 3 Encounters:  05/27/20 (!) 142/70  05/21/20 130/78  04/30/20 122/74    Wt Readings from Last 3 Encounters:  05/27/20 251 lb (113.9 kg)  05/21/20 252 lb (114.3 kg)  04/30/20 252 lb 6.4 oz (114.5 kg)    Physical Exam Vitals reviewed.  Constitutional:      Appearance: Normal appearance. She is obese. She is not ill-appearing or diaphoretic.  HENT:     Nose: Nose normal.     Mouth/Throat:     Mouth: Mucous membranes are moist.  Eyes:     General: No scleral icterus.    Conjunctiva/sclera: Conjunctivae normal.  Cardiovascular:     Rate and Rhythm: Normal rate and regular rhythm.     Heart sounds: No murmur heard.  No friction rub. No gallop.      Comments: EKG- NSR NS T wave changes No changes compared to the prior EKG Pulmonary:     Effort: Pulmonary effort is normal.     Breath sounds: No stridor. No wheezing, rhonchi or rales.  Chest:      Chest wall: No mass, deformity, swelling, tenderness, crepitus or edema.  Abdominal:     General: Abdomen is protuberant. Bowel sounds are normal. There is no distension.     Palpations: Abdomen is soft. There is no hepatomegaly, splenomegaly or mass.     Tenderness: There is no abdominal tenderness.  Musculoskeletal:        General: Normal range of motion.     Cervical back: Neck supple.     Right lower leg: No edema.     Left lower leg: No edema.  Lymphadenopathy:     Cervical: No cervical adenopathy.  Skin:    General: Skin is warm and dry.     Findings: No rash.  Neurological:  General: No focal deficit present.     Mental Status: She is alert. Mental status is at baseline.     Lab Results  Component Value Date   WBC 9.2 03/27/2020   HGB 11.0 (L) 03/27/2020   HCT 33.0 (L) 03/27/2020   PLT 303.0 03/27/2020   GLUCOSE 126 (H) 03/27/2020   CHOL 144 02/21/2020   TRIG 272.0 (H) 02/21/2020   HDL 47.30 02/21/2020   LDLDIRECT 57.0 02/21/2020   LDLCALC 43 03/28/2014   ALT 18 02/21/2020   AST 18 02/21/2020   NA 139 03/27/2020   K 4.0 03/27/2020   CL 101 03/27/2020   CREATININE 0.83 03/27/2020   BUN 11 03/27/2020   CO2 30 03/27/2020   TSH 3.08 02/21/2020   INR 1.0 02/21/2020   HGBA1C 7.3 (H) 02/21/2020   MICROALBUR 0.8 05/04/2019    MYOCARDIAL PERFUSION IMAGING  Result Date: 10/07/2018  Nuclear stress EF: 75%.  Normal perfusion  The study is normal.  This is a low risk study.     Assessment & Plan:   Naavya was seen today for hypertension and gastroesophageal reflux.  Diagnoses and all orders for this visit:  Chest pressure- Her chest pain does not sound anginal. Her EKG is unchanged and she has been thoroughly evaluated for ischemia. Her troponin is negative. I think her pain is from esophageal causes. -     EKG 12-Lead -     Troponin I (High Sensitivity); Future  Type II diabetes mellitus with manifestations ([facility])- Her blood sugar is adequately  well controlled. -     Empagliflozin-metFORMIN HCl (SYNJARDY) 12.5-500 MG TABS; Take 1 tablet by mouth 2 (two) times daily. -     Dulaglutide (TRULICITY) 1.5 AC/1.6SA SOPN; Inject 0.5 mLs (1.5 mg total) into the skin once a week. -     Hemoglobin A1c; Future  Gastroesophageal reflux disease with esophagitis without hemorrhage- I think this explains her chest pain. I have asked her to double the dose of the PPI and to add an H2 blocker. I have also asked her to see GI to see if she needs to undergo upper endoscopy. -     Discontinue: omeprazole (PRILOSEC) 40 MG capsule; Take 1 capsule (40 mg total) by mouth daily. -     famotidine (PEPCID) 40 MG tablet; Take 1 tablet (40 mg total) by mouth at bedtime. -     omeprazole (PRILOSEC) 40 MG capsule; Take 1 capsule (40 mg total) by mouth in the morning and at bedtime.  Esophageal dysphagia -     Ambulatory referral to Gastroenterology  Gastroesophageal reflux disease with esophagitis -     Discontinue: omeprazole (PRILOSEC) 40 MG capsule; Take 1 capsule (40 mg total) by mouth daily. -     omeprazole (PRILOSEC) 40 MG capsule; Take 1 capsule (40 mg total) by mouth in the morning and at bedtime.  Essential hypertension, benign- Her blood pressure is adequately well controlled. -     Basic metabolic panel; Future  Vitamin B12 deficiency anemia due to intrinsic factor deficiency- She continues to be mildly anemic. Will continue parenteral B12 replacement therapy. -     CBC with Differential/Platelet; Future  Thiamine deficiency, unspecified   I have discontinued Pamala Hurry A. Wrisley's omeprazole. I have also changed her Trulicity and omeprazole. Additionally, I am having her start on famotidine. Lastly, I am having her maintain her latanoprost, aspirin, albuterol, albuterol, Insulin Pen Needle, Fluocinonide Emulsified Base, Vitamin D3, gabapentin, cyanocobalamin, potassium chloride SA, ferrous sulfate, mirabegron ER,  oxyCODONE-acetaminophen, irbesartan,  thiamine, gabapentin, nystatin ointment, chlorthalidone, FREESTYLE TEST STRIPS, carvedilol, atorvastatin, gabapentin, and Synjardy.  Meds ordered this encounter  Medications  . Empagliflozin-metFORMIN HCl (SYNJARDY) 12.5-500 MG TABS    Sig: Take 1 tablet by mouth 2 (two) times daily.    Dispense:  180 tablet    Refill:  3  . Dulaglutide (TRULICITY) 1.5 LH/7.3SK SOPN    Sig: Inject 0.5 mLs (1.5 mg total) into the skin once a week.    Dispense:  4 pen    Refill:  0  . DISCONTD: omeprazole (PRILOSEC) 40 MG capsule    Sig: Take 1 capsule (40 mg total) by mouth daily.    Dispense:  180 capsule    Refill:  0  . famotidine (PEPCID) 40 MG tablet    Sig: Take 1 tablet (40 mg total) by mouth at bedtime.    Dispense:  90 tablet    Refill:  1  . omeprazole (PRILOSEC) 40 MG capsule    Sig: Take 1 capsule (40 mg total) by mouth in the morning and at bedtime.    Dispense:  180 capsule    Refill:  0    I spent 50 minutes in preparing to see the patient by review of recent labs, imaging and procedures, obtaining and reviewing separately obtained history, communicating with the patient and family or caregiver, ordering medications, tests or procedures, and documenting clinical information in the EHR including the differential Dx, treatment, and any further evaluation and other management of 1. Type II diabetes mellitus with manifestations (HCC) 2. Chest pressure 3. Gastroesophageal reflux disease with esophagitis without hemorrhage 4. Esophageal dysphagia 5. Gastroesophageal reflux disease with esophagitis 6. Essential hypertension, benign 7. Vitamin B12 deficiency anemia due to intrinsic factor deficiency 8. Thiamine deficiency, unspecified     Follow-up: Return in about 3 months (around 08/27/2020).  [name], MD

## 2020-05-27 NOTE — Patient Instructions (Signed)
Gastroesophageal Reflux Disease, Adult Gastroesophageal reflux (GER) happens when acid from the stomach flows up into the tube that connects the mouth and the stomach (esophagus). Normally, food travels down the esophagus and stays in the stomach to be digested. However, when a person has GER, food and stomach acid sometimes move back up into the esophagus. If this becomes a more serious problem, the person may be diagnosed with a disease called gastroesophageal reflux disease (GERD). GERD occurs when the reflux:  Happens often.  Causes frequent or severe symptoms.  Causes problems such as damage to the esophagus. When stomach acid comes in contact with the esophagus, the acid may cause soreness (inflammation) in the esophagus. Over time, GERD may create small holes (ulcers) in the lining of the esophagus. What are the causes? This condition is caused by a problem with the muscle between the esophagus and the stomach (lower esophageal sphincter, or LES). Normally, the LES muscle closes after food passes through the esophagus to the stomach. When the LES is weakened or abnormal, it does not close properly, and that allows food and stomach acid to go back up into the esophagus. The LES can be weakened by certain dietary substances, medicines, and medical conditions, including:  Tobacco use.  Pregnancy.  Having a hiatal hernia.  Alcohol use.  Certain foods and beverages, such as coffee, chocolate, onions, and peppermint. What increases the risk? You are more likely to develop this condition if you:  Have an increased body weight.  Have a connective tissue disorder.  Use NSAID medicines. What are the signs or symptoms? Symptoms of this condition include:  Heartburn.  Difficult or painful swallowing.  The feeling of having a lump in the throat.  Abitter taste in the mouth.  Bad breath.  Having a large amount of saliva.  Having an upset or bloated  stomach.  Belching.  Chest pain. Different conditions can cause chest pain. Make sure you see your health care provider if you experience chest pain.  Shortness of breath or wheezing.  Ongoing (chronic) cough or a night-time cough.  Wearing away of tooth enamel.  Weight loss. How is this diagnosed? Your health care provider will take a medical history and perform a physical exam. To determine if you have mild or severe GERD, your health care provider may also monitor how you respond to treatment. You may also have tests, including:  A test to examine your stomach and esophagus with a small camera (endoscopy).  A test thatmeasures the acidity level in your esophagus.  A test thatmeasures how much pressure is on your esophagus.  A barium swallow or modified barium swallow test to show the shape, size, and functioning of your esophagus. How is this treated? The goal of treatment is to help relieve your symptoms and to prevent complications. Treatment for this condition may vary depending on how severe your symptoms are. Your health care provider may recommend:  Changes to your diet.  Medicine.  Surgery. Follow these instructions at home: Eating and drinking   Follow a diet as recommended by your health care provider. This may involve avoiding foods and drinks such as: ? Coffee and tea (with or without caffeine). ? Drinks that containalcohol. ? Energy drinks and sports drinks. ? Carbonated drinks or sodas. ? Chocolate and cocoa. ? Peppermint and mint flavorings. ? Garlic and onions. ? Horseradish. ? Spicy and acidic foods, including peppers, chili powder, curry powder, vinegar, hot sauces, and barbecue sauce. ? Citrus fruit juices and citrus   fruits, such as oranges, lemons, and limes. ? Tomato-based foods, such as red sauce, chili, salsa, and pizza with red sauce. ? Fried and fatty foods, such as donuts, french fries, potato chips, and high-fat dressings. ? High-fat  meats, such as hot dogs and fatty cuts of red and white meats, such as rib eye steak, sausage, ham, and bacon. ? High-fat dairy items, such as whole milk, butter, and cream cheese.  Eat small, frequent meals instead of large meals.  Avoid drinking large amounts of liquid with your meals.  Avoid eating meals during the 2-3 hours before bedtime.  Avoid lying down right after you eat.  Do not exercise right after you eat. Lifestyle   Do not use any products that contain nicotine or tobacco, such as cigarettes, e-cigarettes, and chewing tobacco. If you need help quitting, ask your health care provider.  Try to reduce your stress by using methods such as yoga or meditation. If you need help reducing stress, ask your health care provider.  If you are overweight, reduce your weight to an amount that is healthy for you. Ask your health care provider for guidance about a safe weight loss goal. General instructions  Pay attention to any changes in your symptoms.  Take over-the-counter and prescription medicines only as told by your health care provider. Do not take aspirin, ibuprofen, or other NSAIDs unless your health care provider told you to do so.  Wear loose-fitting clothing. Do not wear anything tight around your waist that causes pressure on your abdomen.  Raise (elevate) the head of your bed about 6 inches (15 cm).  Avoid bending over if this makes your symptoms worse.  Keep all follow-up visits as told by your health care provider. This is important. Contact a health care provider if:  You have: ? New symptoms. ? Unexplained weight loss. ? Difficulty swallowing or it hurts to swallow. ? Wheezing or a persistent cough. ? A hoarse voice.  Your symptoms do not improve with treatment. Get help right away if you:  Have pain in your arms, neck, jaw, teeth, or back.  Feel sweaty, dizzy, or light-headed.  Have chest pain or shortness of breath.  Vomit and your vomit looks  like blood or coffee grounds.  Faint.  Have stool that is bloody or black.  Cannot swallow, drink, or eat. Summary  Gastroesophageal reflux happens when acid from the stomach flows up into the esophagus. GERD is a disease in which the reflux happens often, causes frequent or severe symptoms, or causes problems such as damage to the esophagus.  Treatment for this condition may vary depending on how severe your symptoms are. Your health care provider may recommend diet and lifestyle changes, medicine, or surgery.  Contact a health care provider if you have new or worsening symptoms.  Take over-the-counter and prescription medicines only as told by your health care provider. Do not take aspirin, ibuprofen, or other NSAIDs unless your health care provider told you to do so.  Keep all follow-up visits as told by your health care provider. This is important. This information is not intended to replace advice given to you by your health care provider. Make sure you discuss any questions you have with your health care provider. Document Revised: 06/08/2018 Document Reviewed: 06/08/2018 Elsevier Patient Education  2020 [employer]  

## 2020-05-27 NOTE — Progress Notes (Signed)
  Chronic Care Management   Outreach Note  05/27/2020 Name: Valerie [name] MRN: [id number] DOB: [birth year]  Referred by: Janith Lima, MD Reason for referral : No chief complaint on file.   An unsuccessful telephone outreach was attempted today. The patient was referred to the pharmacist for assistance with care management and care coordination. This note is not being shared with the patient for the following reason: To respect privacy (The patient or proxy has requested that the information not be shared).  Follow Up Plan:   [name]

## 2020-05-28 ENCOUNTER — Encounter: Payer: Self-pay | Admitting: Physician Assistant

## 2020-06-01 DIAGNOSIS — G4733 Obstructive sleep apnea (adult) (pediatric): Secondary | ICD-10-CM | POA: Diagnosis not present

## 2020-06-03 ENCOUNTER — Other Ambulatory Visit: Payer: Self-pay | Admitting: Internal Medicine

## 2020-06-03 DIAGNOSIS — E876 Hypokalemia: Secondary | ICD-10-CM

## 2020-06-03 DIAGNOSIS — I10 Essential (primary) hypertension: Secondary | ICD-10-CM

## 2020-06-07 DIAGNOSIS — M79672 Pain in left foot: Secondary | ICD-10-CM | POA: Diagnosis not present

## 2020-06-07 DIAGNOSIS — M21962 Unspecified acquired deformity of left lower leg: Secondary | ICD-10-CM | POA: Diagnosis not present

## 2020-06-07 DIAGNOSIS — M792 Neuralgia and neuritis, unspecified: Secondary | ICD-10-CM | POA: Diagnosis not present

## 2020-06-07 DIAGNOSIS — M19072 Primary osteoarthritis, left ankle and foot: Secondary | ICD-10-CM | POA: Diagnosis not present

## 2020-06-07 DIAGNOSIS — E1351 Other specified diabetes mellitus with diabetic peripheral angiopathy without gangrene: Secondary | ICD-10-CM | POA: Diagnosis not present

## 2020-06-07 DIAGNOSIS — L853 Xerosis cutis: Secondary | ICD-10-CM | POA: Diagnosis not present

## 2020-06-07 DIAGNOSIS — M19071 Primary osteoarthritis, right ankle and foot: Secondary | ICD-10-CM | POA: Diagnosis not present

## 2020-06-12 ENCOUNTER — Telehealth: Payer: Self-pay | Admitting: Internal Medicine

## 2020-06-12 DIAGNOSIS — F418 Other specified anxiety disorders: Secondary | ICD-10-CM

## 2020-06-12 NOTE — Progress Notes (Signed)
  Chronic Care Management   Outreach Note  06/12/2020 Name: Valerie [name] MRN: [id number] DOB: [birth year]  Referred by: Janith Lima, MD Reason for referral : No chief complaint on file.   An unsuccessful telephone outreach was attempted today. The patient was referred to the pharmacist for assistance with care management and care coordination. This note is not being shared with the patient for the following reason: To respect privacy (The patient or proxy has requested that the information not be shared).  Follow Up Plan:   [name]

## 2020-06-13 DIAGNOSIS — M25561 Pain in right knee: Secondary | ICD-10-CM | POA: Diagnosis not present

## 2020-06-13 DIAGNOSIS — S83421A Sprain of lateral collateral ligament of right knee, initial encounter: Secondary | ICD-10-CM | POA: Diagnosis not present

## 2020-06-23 ENCOUNTER — Other Ambulatory Visit: Payer: Self-pay | Admitting: Internal Medicine

## 2020-06-23 DIAGNOSIS — N3281 Overactive bladder: Secondary | ICD-10-CM

## 2020-07-01 ENCOUNTER — Ambulatory Visit (INDEPENDENT_AMBULATORY_CARE_PROVIDER_SITE_OTHER): Payer: HMO | Admitting: Internal Medicine

## 2020-07-01 ENCOUNTER — Other Ambulatory Visit: Payer: Self-pay

## 2020-07-01 ENCOUNTER — Encounter: Payer: Self-pay | Admitting: Internal Medicine

## 2020-07-01 VITALS — BP 140/80 | HR 97 | Temp 98.3°F | Resp 16 | Ht 66.0 in | Wt 250.5 lb

## 2020-07-01 DIAGNOSIS — E039 Hypothyroidism, unspecified: Secondary | ICD-10-CM | POA: Diagnosis not present

## 2020-07-01 DIAGNOSIS — Z961 Presence of intraocular lens: Secondary | ICD-10-CM | POA: Diagnosis not present

## 2020-07-01 DIAGNOSIS — D510 Vitamin B12 deficiency anemia due to intrinsic factor deficiency: Secondary | ICD-10-CM

## 2020-07-01 DIAGNOSIS — M48061 Spinal stenosis, lumbar region without neurogenic claudication: Secondary | ICD-10-CM

## 2020-07-01 DIAGNOSIS — D508 Other iron deficiency anemias: Secondary | ICD-10-CM

## 2020-07-01 DIAGNOSIS — H33193 Other retinoschisis and retinal cysts, bilateral: Secondary | ICD-10-CM | POA: Diagnosis not present

## 2020-07-01 DIAGNOSIS — E118 Type 2 diabetes mellitus with unspecified complications: Secondary | ICD-10-CM | POA: Diagnosis not present

## 2020-07-01 DIAGNOSIS — H40013 Open angle with borderline findings, low risk, bilateral: Secondary | ICD-10-CM | POA: Diagnosis not present

## 2020-07-01 DIAGNOSIS — E119 Type 2 diabetes mellitus without complications: Secondary | ICD-10-CM | POA: Diagnosis not present

## 2020-07-01 DIAGNOSIS — D518 Other vitamin B12 deficiency anemias: Secondary | ICD-10-CM | POA: Diagnosis not present

## 2020-07-01 DIAGNOSIS — M8949 Other hypertrophic osteoarthropathy, multiple sites: Secondary | ICD-10-CM | POA: Diagnosis not present

## 2020-07-01 DIAGNOSIS — E519 Thiamine deficiency, unspecified: Secondary | ICD-10-CM

## 2020-07-01 DIAGNOSIS — H25811 Combined forms of age-related cataract, right eye: Secondary | ICD-10-CM | POA: Diagnosis not present

## 2020-07-01 DIAGNOSIS — M159 Polyosteoarthritis, unspecified: Secondary | ICD-10-CM

## 2020-07-01 DIAGNOSIS — E559 Vitamin D deficiency, unspecified: Secondary | ICD-10-CM | POA: Diagnosis not present

## 2020-07-01 DIAGNOSIS — F331 Major depressive disorder, recurrent, moderate: Secondary | ICD-10-CM

## 2020-07-01 MED ORDER — OXYCODONE-ACETAMINOPHEN 5-325 MG PO TABS: 1.0000 | ORAL_TABLET | Freq: Three times a day (TID) | ORAL | 0 refills | Status: DC | PRN

## 2020-07-01 MED ORDER — SYNJARDY 12.5-500 MG PO TABS: 1.0000 | ORAL_TABLET | Freq: Two times a day (BID) | ORAL | 3 refills | Status: DC

## 2020-07-01 MED ORDER — DULOXETINE HCL 30 MG PO CPEP: 30.0000 mg | ORAL_CAPSULE | Freq: Every day | ORAL | 0 refills | Status: DC

## 2020-07-01 NOTE — Progress Notes (Signed)
Subjective:  Valerie [name] ID: [name], female    DOB: 04-09-1946  Age: 74 y.o. MRN: [id number]  CC: Diabetes, Osteoarthritis, and Anemia  This visit occurred during Valerie SARS-CoV-2 public health emergency.  Safety protocols were in place, including screening questions prior to Valerie visit, additional usage of staff PPE, and extensive cleaning of exam room while observing appropriate contact time as indicated for disinfecting solutions.    HPI Valerie Valerie [name] presents for f/up - Valerie Valerie [name] complains of fatigue, depression, anhedonia, and at times feeling hopeless and helpless.  Valerie Valerie [name] denies SI or HI.  Valerie Valerie [name] is not currently taking an antidepressant.  Valerie Valerie [name] also complains of chronic joint pain from osteoarthritis and requests a refill of Percocet.  Outpatient Medications Prior to Visit  Medication Sig Dispense Refill  . albuterol (PROVENTIL) (2.5 MG/3ML) 0.083% nebulizer solution     . albuterol (VENTOLIN HFA) 108 (90 Base) MCG/ACT inhaler Inhale 2 puffs into Valerie lungs every 6 (six) hours as needed. For shortness of breath 3 Inhaler 4  . aspirin 81 MG tablet Take 81 mg by mouth daily.    [name] atorvastatin (LIPITOR) 20 MG tablet TAKE 1 TABLET BY MOUTH EVERY DAY 90 tablet 1  . carvedilol (COREG) 25 MG tablet TAKE 1 TABLET BY MOUTH TWICE DAILY WITH MEALS 180 tablet 0  . chlorthalidone (HYGROTON) 25 MG tablet TAKE 1 TABLET BY MOUTH EVERY DAY 90 tablet 0  . Dulaglutide (TRULICITY) 1.5 PI/9.5JO SOPN Inject 0.5 mLs (1.5 mg total) into Valerie skin once a week. 4 pen 0  . famotidine (PEPCID) 40 MG tablet Take 1 tablet (40 mg total) by mouth at bedtime. 90 tablet 1  . ferrous sulfate 325 (65 FE) MG tablet TAKE 1 TABLET BY MOUTH TWICE DAILY WITH A MEAL 180 tablet 1  . Fluocinonide Emulsified Base 0.05 % CREA APPLY  CREAM EXTERNALLY TO AFFECTED AREA TWICE DAILY 60 g 2  . gabapentin (NEURONTIN) 100 MG capsule Take 2 capsules (200 mg total) by mouth 2 (two) times daily. 120 capsule 0  . gabapentin (NEURONTIN) 300 MG capsule  TAKE 1 CAPSULE BY MOUTH THREE TIMES DAILY 270 capsule 0  . glucose blood (FREESTYLE TEST STRIPS) test strip USE TO TEST BLOOD SUGAR 3 TIMES A DAY. DX: E11.8 100 strip 11  . Insulin Pen Needle 31G X 5 MM MISC Use once daily with insulin 100 each 3  . irbesartan (AVAPRO) 300 MG tablet Take 0.5 tablets (150 mg total) by mouth daily. (Valerie Valerie [name]: Take 150 mg by mouth as needed. ) 90 tablet 1  . latanoprost (XALATAN) 0.005 % ophthalmic solution Place 1 drop into both eyes at bedtime.    [name] MYRBETRIQ 25 MG TB24 tablet TAKE 1 TABLET BY MOUTH EVERY DAY 90 tablet 0  . nystatin ointment (MYCOSTATIN) APPLY ON Valerie SKIN THREE TIMES DAILY    . omeprazole (PRILOSEC) 40 MG capsule Take 1 capsule (40 mg total) by mouth in Valerie morning and at bedtime. 180 capsule 0  . potassium chloride SA (KLOR-CON M20) 20 MEQ tablet TAKE 1 TABLET BY MOUTH TWICE A DAY 180 tablet 1  . thiamine ([facility] B-1) 100 MG tablet Take 1 tablet (100 mg total) by mouth daily. 100 tablet 1  . Cholecalciferol (VITAMIN D3) 1.25 MG (50000 UT) CAPS Take 1 capsule by mouth once a week 12 capsule 0  . cyanocobalamin 2000 MCG tablet Take 1 tablet (2,000 mcg total) by mouth daily. 90 tablet 1  . gabapentin (NEURONTIN) 400 MG capsule Take  1 capsule (400 mg total) by mouth 3 (three) times daily. 180 capsule 0  . oxyCODONE-acetaminophen (PERCOCET/ROXICET) 5-325 MG tablet Take 1 tablet by mouth every 8 (eight) hours as needed for severe pain. 90 tablet 0  . Empagliflozin-metFORMIN HCl (SYNJARDY) 12.5-500 MG TABS Take 1 tablet by mouth 2 (two) times daily. 180 tablet 3   No facility-administered medications prior to visit.    ROS Review of Systems  Constitutional: Positive for fatigue and unexpected weight change (wt gain). Negative for chills and diaphoresis.  HENT: Negative.   Eyes: Negative.   Respiratory: Negative for cough, chest tightness, shortness of breath and wheezing.   Cardiovascular: Negative for chest pain, palpitations and  leg swelling.  Gastrointestinal: Negative for abdominal pain, constipation, diarrhea, nausea and vomiting.  Endocrine: Negative for cold intolerance and heat intolerance.  Genitourinary: Negative.  Negative for difficulty urinating.  Musculoskeletal: Positive for arthralgias and back pain. Negative for myalgias.  Skin: Negative.  Negative for pallor.  Neurological: Negative.  Negative for dizziness, weakness, light-headedness, numbness and headaches.  Hematological: Negative for adenopathy. Does not bruise/bleed easily.  Psychiatric/Behavioral: Positive for dysphoric mood. Negative for behavioral problems, confusion, self-injury, sleep disturbance and suicidal ideas. Valerie Valerie [name] is nervous/anxious.     Objective:  BP 140/80 (BP Location: Left Arm, Valerie [name] Position: Sitting, Cuff Size: Large)   Pulse 97   Temp 98.3 F (36.8 C) (Oral)   Resp 16   Ht 5\' 6"  (1.676 m)   Wt 250 lb 8 oz (113.6 kg)   SpO2 96%   BMI 40.43 kg/m   BP Readings from Last 3 Encounters:  07/02/20 140/78  07/02/20 120/76  07/01/20 140/80    Wt Readings from Last 3 Encounters:  07/02/20 253 lb (114.8 kg)  07/02/20 251 lb 4 oz (114 kg)  07/01/20 250 lb 8 oz (113.6 kg)    Physical Exam Vitals reviewed.  Constitutional:      General: Valerie Valerie [name] is not in acute distress.    Appearance: Valerie Valerie [name] is obese. Valerie Valerie [name] is not toxic-appearing or diaphoretic.  HENT:     Nose: Nose normal.     Mouth/Throat:     Mouth: Mucous membranes are moist.  Eyes:     General: No scleral icterus.    Conjunctiva/sclera: Conjunctivae normal.  Cardiovascular:     Rate and Rhythm: Normal rate and regular rhythm.     Heart sounds: No murmur heard.   Pulmonary:     Effort: Pulmonary effort is normal.     Breath sounds: No stridor. No wheezing, rhonchi or rales.  Abdominal:     General: Abdomen is protuberant. Bowel sounds are normal. There is no distension.     Palpations: Abdomen is soft. There is no hepatomegaly, splenomegaly or mass.    Musculoskeletal:        General: Normal range of motion.     Cervical back: Neck supple.     Right lower leg: No edema.     Left lower leg: No edema.  Lymphadenopathy:     Cervical: No cervical adenopathy.  Skin:    General: Skin is warm and dry.     Coloration: Skin is not pale.  Neurological:     General: No focal deficit present.     Mental Status: Valerie Valerie [name] is alert.  Psychiatric:        Attention and Perception: Attention normal.        Mood and Affect: Mood is depressed. Affect is flat.        Speech:  Speech normal.        Behavior: Behavior normal.        Thought Content: Thought content normal. Thought content is not paranoid or delusional. Thought content does not include homicidal or suicidal ideation.        Cognition and Memory: Cognition normal.        Judgment: Judgment normal.     Lab Results  Component Value Date   WBC 10.4 07/01/2020   HGB 11.8 07/01/2020   HCT 35.3 07/01/2020   PLT 359 07/01/2020   GLUCOSE 125 (H) 05/27/2020   CHOL 144 02/21/2020   TRIG 272.0 (H) 02/21/2020   HDL 47.30 02/21/2020   LDLDIRECT 57.0 02/21/2020   LDLCALC 43 03/28/2014   ALT 18 02/21/2020   AST 18 02/21/2020   NA 137 05/27/2020   K 3.5 05/27/2020   CL 98 05/27/2020   CREATININE 0.91 05/27/2020   BUN 20 05/27/2020   CO2 27 05/27/2020   TSH 3.79 07/01/2020   INR 1.0 02/21/2020   HGBA1C 7.4 (H) 05/27/2020   MICROALBUR 0.5 07/01/2020    MYOCARDIAL PERFUSION IMAGING  Result Date: 10/07/2018  Nuclear stress EF: 75%.  Normal perfusion  Valerie study is normal.  This is a low risk study.     Assessment & Plan:   Anzley was seen today for diabetes, osteoarthritis and anemia.  Diagnoses and all orders for this visit:  Moderate episode of recurrent major depressive disorder ([place])- I recommended that Valerie Valerie [name] start taking duloxetine and that we increase Valerie dose over time. -     DULoxetine (CYMBALTA) 30 MG capsule; Take 1 capsule (30 mg total) by mouth daily.  Type II  diabetes mellitus with manifestations ([place])- Valerie Valerie [name] blood sugar has been well controlled.  I recommended that Valerie Valerie [name] continue Valerie combination of a GLP-1 agonist, SGLT2 inhibitor, and Metformin. -     Empagliflozin-metFORMIN HCl (SYNJARDY) 12.5-500 MG TABS; Take 1 tablet by mouth 2 (two) times daily. -     Microalbumin / creatinine urine ratio; Future -     HM DIABETES FOOT EXAM -     Microalbumin / creatinine urine ratio  Acquired hypothyroidism- Valerie Valerie [name] TSH is in Valerie normal range.  Valerie Valerie [name] will stay on Valerie current dose of levothyroxine. -     TSH; Future -     TSH  Vitamin B12 deficiency anemia due to intrinsic factor deficiency- Valerie Valerie [name] and Valerie Valerie [name] B12 level is high.  I recommended Valerie Valerie [name] stop taking Valerie B12 supplement. -     Vitamin B12; Future -     Vitamin B12  Thiamine deficiency, unspecified- Valerie Valerie [name] and Valerie Valerie [name] thiamine level is elevated.  I recommended that Valerie Valerie [name] stop taking Valerie thiamine supplement. -     CBC with Differential/Platelet; Future -     Vitamin B1; Future -     Vitamin B1 -     CBC with Differential/Platelet  Vitamin D deficiency disease- Valerie Valerie [name] vitamin D level is normal now. -     VITAMIN D 25 Hydroxy (Vit-D Deficiency, Fractures); Future -     VITAMIN D 25 Hydroxy (Vit-D Deficiency, Fractures) -     Discontinue: Cholecalciferol 50 MCG (2000 UT) TABS; Take 1 tablet (2,000 Units total) by mouth daily.  Other vitamin B12 deficiency anemia -     Folate; Future -     Folate  Iron deficiency anemia secondary to inadequate dietary iron intake- Valerie Valerie [name] H&H and iron levels are normal. 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  Primary osteoarthritis involving multiple joints -      oxyCODONE-acetaminophen (PERCOCET/ROXICET) 5-325 MG tablet; Take 1 tablet by mouth every 8 (eight) hours as needed for severe pain.  Other orders -     Cancel: IBC panel -     Iron,Total/Total Iron Binding Cap   I have discontinued Pamala Hurry A. Heuerman's Vitamin D3. I am also having Valerie Valerie [name] start on DULoxetine. Additionally, I am having Valerie Valerie [name] maintain Valerie Valerie [name] latanoprost, aspirin, albuterol, albuterol, Insulin Pen Needle, Fluocinonide Emulsified Base, gabapentin, ferrous sulfate, irbesartan, thiamine, nystatin ointment, chlorthalidone, FREESTYLE TEST STRIPS, carvedilol, atorvastatin, gabapentin, Trulicity, famotidine, omeprazole, potassium chloride SA, Myrbetriq, Synjardy, and oxyCODONE-acetaminophen.  Meds ordered this encounter  Medications  . Empagliflozin-metFORMIN HCl (SYNJARDY) 12.5-500 MG TABS    Sig: Take 1 tablet by mouth 2 (two) times daily.    Dispense:  180 tablet    Refill:  3    Synjardy Valerie [name] Assistance: Refill fax: [phone]  . DULoxetine (CYMBALTA) 30 MG capsule    Sig: Take 1 capsule (30 mg total) by mouth daily.    Dispense:  30 capsule    Refill:  0  . oxyCODONE-acetaminophen (PERCOCET/ROXICET) 5-325 MG tablet    Sig: Take 1 tablet by mouth every 8 (eight) hours as needed for severe pain.    Dispense:  90 tablet    Refill:  0  . DISCONTD: Cholecalciferol 50 MCG (2000 UT) TABS    Sig: Take 1 tablet (2,000 Units total) by mouth daily.    Dispense:  90 tablet    Refill:  1   I spent 50 minutes in preparing to see Valerie Valerie [name] by review of recent labs, imaging and procedures, obtaining and reviewing separately obtained history, communicating with Valerie Valerie [name] and family or caregiver, ordering medications, tests or procedures, and documenting clinical information in Valerie EHR including Valerie differential Dx, treatment, and any further evaluation and other management of 1. Type II diabetes mellitus with manifestations ([place]) 2. Moderate episode of recurrent major depressive disorder  ([place]) 3. Acquired hypothyroidism 4. 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 Thiamine deficiency, unspecified 6. Vitamin D deficiency disease 7. Other vitamin B12 deficiency anemia 8. Iron deficiency anemia secondary to inadequate dietary iron intake 9. Spinal stenosis of lumbar 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 10. Primary osteoarthritis involving multiple joints    Follow-up: Return in about 3 months (around 10/01/2020).  [name], MD

## 2020-07-01 NOTE — Patient Instructions (Signed)
Major Depressive Disorder, Adult Major depressive disorder (MDD) is a mental health condition. It may also be called clinical depression or unipolar depression. MDD usually causes feelings of sadness, hopelessness, or helplessness. MDD can also cause physical symptoms. It can interfere with work, school, relationships, and other everyday activities. MDD may be mild, moderate, or severe. It may occur once (single episode major depressive disorder) or it may occur multiple times (recurrent major depressive disorder). What are the causes? The exact cause of this condition is not known. MDD is most likely caused by a combination of things, which may include:  Genetic factors. These are traits that are passed along from parent to child.  Individual factors. Your personality, your behavior, and the way you handle your thoughts and feelings may contribute to MDD. This includes personality traits and behaviors learned from others.  Physical factors, such as: ? Differences in the part of your brain that controls emotion. This part of your brain may be different than it is in people who do not have MDD. ? Long-term (chronic) medical or psychiatric illnesses.  Social factors. Traumatic experiences or major life changes may play a role in the development of MDD. What increases the risk? This condition is more likely to develop in women. The following factors may also make you more likely to develop MDD:  A family history of depression.  Troubled family relationships.  Abnormally low levels of certain brain chemicals.  Traumatic events in childhood, especially abuse or the loss of a parent.  Being under a lot of stress, or long-term stress, especially from upsetting life experiences or losses.  A history of: ? Chronic physical illness. ? Other mental health disorders. ? Substance abuse.  Poor living conditions.  Experiencing social exclusion or discrimination on a regular basis. What are the  signs or symptoms? The main symptoms of MDD typically include:  Constant depressed or irritable mood.  Loss of interest in things and activities. MDD symptoms may also include:  Sleeping or eating too much or too little.  Unexplained weight change.  Fatigue or low energy.  Feelings of worthlessness or guilt.  Difficulty thinking clearly or making decisions.  Thoughts of suicide or of harming others.  Physical agitation or weakness.  Isolation. Severe cases of MDD may also occur with other symptoms, such as:  Delusions or hallucinations, in which you imagine things that are not real (psychotic depression).  Low-level depression that lasts at least a year (chronic depression or persistent depressive disorder).  Extreme sadness and hopelessness (melancholic depression).  Trouble speaking and moving (catatonic depression). How is this diagnosed? This condition may be diagnosed based on:  Your symptoms.  Your medical history, including your mental health history. This may involve tests to evaluate your mental health. You may be asked questions about your lifestyle, including any drug and alcohol use, and how long you have had symptoms of MDD.  A physical exam.  Blood tests to rule out other conditions. You must have a depressed mood and at least four other MDD symptoms most of the day, nearly every day in the same 2-week timeframe before your health care provider can confirm a diagnosis of MDD. How is this treated? This condition is usually treated by mental health professionals, such as psychologists, psychiatrists, and clinical social workers. You may need more than one type of treatment. Treatment may include:  Psychotherapy. This is also called talk therapy or counseling. Types of psychotherapy include: ? Cognitive behavioral therapy (CBT). This type of therapy   teaches you to recognize unhealthy feelings, thoughts, and behaviors, and replace them with positive thoughts  and actions. ? Interpersonal therapy (IPT). This helps you to improve the way you relate to and communicate with others. ? Family therapy. This treatment includes members of your family.  Medicine to treat anxiety and depression, or to help you control certain emotions and behaviors.  Lifestyle changes, such as: ? Limiting alcohol and drug use. ? Exercising regularly. ? Getting plenty of sleep. ? Making healthy eating choices. ? Spending more time outdoors.  Treatments involving stimulation of the brain can be used in situations with extremely severe symptoms, or when medicine or other therapies do not work over time. These treatments include electroconvulsive therapy, transcranial magnetic stimulation, and vagal nerve stimulation. Follow these instructions at home: Activity  Return to your normal activities as told by your health care provider.  Exercise regularly and spend time outdoors as told by your health care provider. General instructions  Take over-the-counter and prescription medicines only as told by your health care provider.  Do not drink alcohol. If you drink alcohol, limit your alcohol intake to no more than 1 drink a day for nonpregnant women and 2 drinks a day for men. One drink equals 12 oz of beer, 5 oz of wine, or 1 oz of hard liquor. Alcohol can affect any antidepressant medicines you are taking. Talk to your health care provider about your alcohol use.  Eat a healthy diet and get plenty of sleep.  Find activities that you enjoy doing, and make time to do them.  Consider joining a support group. Your health care provider may be able to recommend a support group.  Keep all follow-up visits as told by your health care provider. This is important. Where to find more information National Alliance on Mental Illness  www.nami.org [place] National Institute of Mental Health  www.nimh.nih.gov National Suicide Prevention Lifeline  1-800-273-TALK (8255). This is  free, 24-hour help. Contact a health care provider if:  Your symptoms get worse.  You develop new symptoms. Get help right away if:  You self-harm.  You have serious thoughts about hurting yourself or others.  You see, hear, taste, smell, or feel things that are not present (hallucinate). This information is not intended to replace advice given to you by your health care provider. Make sure you discuss any questions you have with your health care provider. Document Revised: 11/12/2017 Document Reviewed: 06/10/2016 Elsevier Patient Education  2020 Elsevier Inc.  

## 2020-07-02 ENCOUNTER — Encounter: Payer: Self-pay | Admitting: Physician Assistant

## 2020-07-02 ENCOUNTER — Ambulatory Visit (INDEPENDENT_AMBULATORY_CARE_PROVIDER_SITE_OTHER): Payer: HMO

## 2020-07-02 ENCOUNTER — Encounter: Payer: Self-pay | Admitting: Family Medicine

## 2020-07-02 ENCOUNTER — Ambulatory Visit: Payer: HMO | Admitting: Physician Assistant

## 2020-07-02 ENCOUNTER — Ambulatory Visit: Payer: HMO | Admitting: Family Medicine

## 2020-07-02 VITALS — BP 120/76 | HR 88 | Ht 65.0 in | Wt 251.2 lb

## 2020-07-02 VITALS — BP 140/78 | HR 94 | Ht 65.0 in | Wt 253.0 lb

## 2020-07-02 DIAGNOSIS — M25561 Pain in right knee: Secondary | ICD-10-CM

## 2020-07-02 DIAGNOSIS — Z800 Family history of malignant neoplasm of digestive organs: Secondary | ICD-10-CM | POA: Diagnosis not present

## 2020-07-02 DIAGNOSIS — G8929 Other chronic pain: Secondary | ICD-10-CM

## 2020-07-02 DIAGNOSIS — M1711 Unilateral primary osteoarthritis, right knee: Secondary | ICD-10-CM | POA: Diagnosis not present

## 2020-07-02 DIAGNOSIS — R140 Abdominal distension (gaseous): Secondary | ICD-10-CM

## 2020-07-02 DIAGNOSIS — R1314 Dysphagia, pharyngoesophageal phase: Secondary | ICD-10-CM | POA: Diagnosis not present

## 2020-07-02 DIAGNOSIS — M48061 Spinal stenosis, lumbar region without neurogenic claudication: Secondary | ICD-10-CM | POA: Diagnosis not present

## 2020-07-02 DIAGNOSIS — K219 Gastro-esophageal reflux disease without esophagitis: Secondary | ICD-10-CM

## 2020-07-02 LAB — IRON, TOTAL/TOTAL IRON BINDING CAP
%SAT: 23 % (calc) (ref 16–45)
Iron: 81 ug/dL (ref 45–160)
TIBC: 356 mcg/dL (calc) (ref 250–450)

## 2020-07-02 MED ORDER — PANTOPRAZOLE SODIUM 40 MG PO TBEC
40.0000 mg | DELAYED_RELEASE_TABLET | Freq: Two times a day (BID) | ORAL | 3 refills | Status: DC
Start: 2020-07-02 — End: 2020-09-24

## 2020-07-02 NOTE — Patient Instructions (Signed)
If you are age 72 or older, your body mass index should be between 23-30. Your Body mass index is 41.81 kg/m. If this is out of the aforementioned range listed, please consider follow up with your Primary Care Provider.  If you are age 1 or younger, your body mass index should be between 19-25. Your Body mass index is 41.81 kg/m. If this is out of the aformentioned range listed, please consider follow up with your Primary Care Provider.   We have sent the following medications to your pharmacy for you to pick up at your convenience: Pantoprazole 40 mg twice a day 30-60 minutes before breakfast and dinner   Stop omeprazole   You have been scheduled for an endoscopy. Please follow written instructions given to you at your visit today. If you use inhalers (even only as needed), please bring them with you on the day of your procedure.  Due to recent changes in healthcare laws, you may see the results of your imaging and laboratory studies on MyChart before your provider has had a chance to review them.  We understand that in some cases there may be results that are confusing or concerning to you. Not all laboratory results come back in the same time frame and the provider may be waiting for multiple results in order to interpret others.  Please give [place] 48 hours in order for your provider to thoroughly review all the results before contacting the office for clarification of your results.

## 2020-07-02 NOTE — Assessment & Plan Note (Signed)
Continues to have some difficulty.  Patient really does not want with any significant change in management.  We discussed neck steps could be potentially advanced imaging and epidurals.  Patient states that she is going to be undergoing cataract surgery and has an endoscopy coming up as well.  Encourage patient continue to work work on the weight loss.  Follow-up again 6 to 8 weeks.

## 2020-07-02 NOTE — Progress Notes (Signed)
Chief Complaint: Dysphagia, gas and bloating, GERD  HPI:    Valerie [name] is a 74 year old female with a past medical history as listed below, who previously followed with Dr. Ardis Hughs, who was referred to me by Janith Lima, MD for a complaint of dysphagia, gas and bloating and reflux.      11/23/2006 colonoscopy for anemia and hematochezia was normal with only hemorrhoids.  It was noted she had a family history of colon cancer in her brother who died in the 27s and needed a repeat exam in 5 years.    02/11/2018 EGD for iron deficiency anemia was normal.    Today, the patient presents to clinic and explains that she has a lot of GI problems.  She is not a great historian and does not tell me many details.  Describes that she has been feeling a lot of bloating and gas with excessive belching and having trouble with pills and even water and food getting stuck in her throat on the way down.  Tells me she has a constant feeling like something is still in there.  Denies ever having this issue before.  Also complains of reflux symptoms regardless of her Omeprazole 40 mg twice daily and Pepcid 40 mg at night (this was just added by her PCP).    Tells me she has regular bowel movements with the help of a fiber supplement.    Describes a colonoscopy and EGD 2 to 3 years ago with Dr. Benson Norway.  We do not have these reports.    Denies fever, chills, weight loss or symptoms that awaken her from sleep.  Past Medical History:  Diagnosis Date  . Anemia   . Anxiety and depression   . Asthma   . Degenerative arthritis   . Depression   . Diabetes mellitus, type 2 ([place])   . Fatigue   . GERD (gastroesophageal reflux disease)   . Glaucoma   . Hemorrhoids   . Hyperlipidemia   . Hypertension   . Hypothyroid   . Hypoxemia 11/24/2013  . Memory deficit 10/04/2013  . Obesity   . Sleep apnea   . Snoring disorder     Past Surgical History:  Procedure Laterality Date  . benign tumors resected    . CATARACT  EXTRACTION Left   . FOOT SURGERY Left    bone spur  . REFRACTIVE SURGERY     Glaucoma  . ROTATOR CUFF REPAIR    . TUBAL LIGATION      Current Outpatient Medications  Medication Sig Dispense Refill  . albuterol (PROVENTIL) (2.5 MG/3ML) 0.083% nebulizer solution     . albuterol (VENTOLIN HFA) 108 (90 Base) MCG/ACT inhaler Inhale 2 puffs into the lungs every 6 (six) hours as needed. For shortness of breath 3 Inhaler 4  . aspirin 81 MG tablet Take 81 mg by mouth daily.    [name] atorvastatin (LIPITOR) 20 MG tablet TAKE 1 TABLET BY MOUTH EVERY DAY 90 tablet 1  . carvedilol (COREG) 25 MG tablet TAKE 1 TABLET BY MOUTH TWICE DAILY WITH MEALS 180 tablet 0  . chlorthalidone (HYGROTON) 25 MG tablet TAKE 1 TABLET BY MOUTH EVERY DAY 90 tablet 0  . Cholecalciferol (VITAMIN D3) 1.25 MG (50000 UT) CAPS Take 1 capsule by mouth once a week 12 capsule 0  . cyanocobalamin 2000 MCG tablet Take 1 tablet (2,000 mcg total) by mouth daily. 90 tablet 1  . Dulaglutide (TRULICITY) 1.5 EZ/6.6QH SOPN Inject 0.5 mLs (1.5 mg total) into the  skin once a week. 4 pen 0  . Empagliflozin-metFORMIN HCl (SYNJARDY) 12.5-500 MG TABS Take 1 tablet by mouth 2 (two) times daily. 180 tablet 3  . famotidine (PEPCID) 40 MG tablet Take 1 tablet (40 mg total) by mouth at bedtime. 90 tablet 1  . ferrous sulfate 325 (65 FE) MG tablet TAKE 1 TABLET BY MOUTH TWICE DAILY WITH A MEAL 180 tablet 1  . Fluocinonide Emulsified Base 0.05 % CREA APPLY  CREAM EXTERNALLY TO AFFECTED AREA TWICE DAILY 60 g 2  . gabapentin (NEURONTIN) 300 MG capsule TAKE 1 CAPSULE BY MOUTH THREE TIMES DAILY 270 capsule 0  . glucose blood (FREESTYLE TEST STRIPS) test strip USE TO TEST BLOOD SUGAR 3 TIMES A DAY. DX: E11.8 100 strip 11  . Insulin Pen Needle 31G X 5 MM MISC Use once daily with insulin 100 each 3  . irbesartan (AVAPRO) 300 MG tablet Take 0.5 tablets (150 mg total) by mouth daily. (Patient taking differently: Take 150 mg by mouth as needed. ) 90 tablet 1  .  latanoprost (XALATAN) 0.005 % ophthalmic solution Place 1 drop into both eyes at bedtime.    [name] MYRBETRIQ 25 MG TB24 tablet TAKE 1 TABLET BY MOUTH EVERY DAY 90 tablet 0  . omeprazole (PRILOSEC) 40 MG capsule Take 1 capsule (40 mg total) by mouth in the morning and at bedtime. 180 capsule 0  . oxyCODONE-acetaminophen (PERCOCET/ROXICET) 5-325 MG tablet Take 1 tablet by mouth every 8 (eight) hours as needed for severe pain. 90 tablet 0  . potassium chloride SA (KLOR-CON M20) 20 MEQ tablet TAKE 1 TABLET BY MOUTH TWICE A DAY 180 tablet 1  . thiamine (CVS B-1) 100 MG tablet Take 1 tablet (100 mg total) by mouth daily. 100 tablet 1  . DULoxetine (CYMBALTA) 30 MG capsule Take 1 capsule (30 mg total) by mouth daily. (Patient not taking: Reported on 07/02/2020) 30 capsule 0  . gabapentin (NEURONTIN) 100 MG capsule Take 2 capsules (200 mg total) by mouth 2 (two) times daily. (Patient not taking: Reported on 07/02/2020) 120 capsule 0  . nystatin ointment (MYCOSTATIN) APPLY ON THE SKIN THREE TIMES DAILY (Patient not taking: Reported on 07/02/2020)     No current facility-administered medications for this visit.    Allergies as of 07/02/2020 - Review Complete 07/02/2020  Allergen Reaction Noted  . Food Swelling 05/04/2012  . Penicillins Swelling and Rash     Family History  Problem Relation Age of Onset  . Alcohol abuse Mother   . Heart attack Mother   . Coronary artery disease Brother   . Heart attack Father   . Heart disease Sister   . Atrial fibrillation Sister   . Hypertension Sister   . Hypertension Other        family history  . Alcohol abuse Other     Social History   Socioeconomic History  . Marital status: Widowed    Spouse name: Not on file  . Number of children: 3  . Years of education: 11th  . Highest education level: 11th grade  Occupational History  . Occupation: disabled  Tobacco Use  . Smoking status: Never Smoker  . Smokeless tobacco: Never Used  Vaping Use  . Vaping  Use: Never used  Substance and Sexual Activity  . Alcohol use: No    Alcohol/week: 0.0 standard drinks  . Drug use: No  . Sexual activity: Not Currently    Comment: not working, lives with husband, 3 sons  Other Topics Concern  .  Not on file  Social History Narrative   Client's spouse recently passed Feb 2021    also caregiver for son who is 17 yo and is disabled.   Social Determinants of Health   Financial Resource Strain: Low Risk   . Difficulty of Paying Living Expenses: Not hard at all  Food Insecurity: No Food Insecurity  . Worried About Charity fundraiser in the Last Year: Never true  . Ran Out of Food in the Last Year: Never true  Transportation Needs: No Transportation Needs  . Lack of Transportation (Medical): No  . Lack of Transportation (Non-Medical): No  Physical Activity: Inactive  . Days of Exercise per Week: 0 days  . Minutes of Exercise per Session: 0 min  Stress: Stress Concern Present  . Feeling of Stress : Rather much  Social Connections: Moderately Integrated  . Frequency of Communication with Friends and Family: More than three times a week  . Frequency of Social Gatherings with Friends and Family: More than three times a week  . Attends Religious Services: More than 4 times per year  . Active Member of Clubs or Organizations: Yes  . Attends Archivist Meetings: More than 4 times per year  . Marital Status: Widowed  Intimate Partner Violence: Not At Risk  . Fear of Current or Ex-Partner: No  . Emotionally Abused: No  . Physically Abused: No  . Sexually Abused: No    Review of Systems:    Constitutional: No weight loss, fever or chills Skin: No rash  Cardiovascular: No chest pain   Respiratory: No SOB Gastrointestinal: See HPI and otherwise negative Genitourinary: No dysuria  Neurological: No headache, dizziness or syncope Musculoskeletal: No new muscle or joint pain Hematologic: No bleeding Psychiatric: No history of depression or  anxiety   Physical Exam:  Vital signs: BP 120/76 (BP Location: Left Arm, Patient Position: Sitting, Cuff Size: Normal)   Pulse 88   Ht 5\' 5"  (1.651 m) Comment: height measured without shoes  Wt 251 lb 4 oz (114 kg)   BMI 41.81 kg/m   Constitutional:   Pleasant obese AA female appears to be in NAD, Well developed, Well nourished, alert and cooperative Head:  Normocephalic and atraumatic. Eyes:   PEERL, EOMI. No icterus. Conjunctiva pink. Ears:  Normal auditory acuity. Neck:  Supple Throat: Oral cavity and pharynx without inflammation, swelling or lesion.  Respiratory: Respirations even and unlabored. Lungs clear to auscultation bilaterally.   No wheezes, crackles, or rhonchi.  Cardiovascular: Normal S1, S2. No MRG. Regular rate and rhythm. No peripheral edema, cyanosis or pallor.  Gastrointestinal:  Soft, nondistended, nontender. No rebound or guarding. Normal bowel sounds. No appreciable masses or hepatomegaly. Rectal:  Not performed.  Msk:  Symmetrical without gross deformities. Without edema, no deformity or joint abnormality.  Neurologic:  Alert and  oriented x4;  grossly normal neurologically.  Skin:   Dry and intact without significant lesions or rashes. Psychiatric:  Demonstrates good judgement and reason without abnormal affect or behaviors.  MOST RECENT LABS AND IMAGING: CBC    Component Value Date/Time   WBC 11.7 (H) 05/27/2020 1359   RBC 3.80 (L) 05/27/2020 1359   HGB 11.3 (L) 05/27/2020 1359   HCT 34.4 (L) 05/27/2020 1359   PLT 368.0 05/27/2020 1359   MCV 90.7 05/27/2020 1359   MCH 27.0 08/29/2015 0630   MCHC 32.7 05/27/2020 1359   RDW 15.9 (H) 05/27/2020 1359   LYMPHSABS 2.9 05/27/2020 1359   MONOABS 1.0 05/27/2020 1359  EOSABS 0.3 05/27/2020 1359   BASOSABS 0.1 05/27/2020 1359    CMP     Component Value Date/Time   NA 137 05/27/2020 1359   K 3.5 05/27/2020 1359   CL 98 05/27/2020 1359   CO2 27 05/27/2020 1359   GLUCOSE 125 (H) 05/27/2020 1359   BUN 20  05/27/2020 1359   CREATININE 0.91 05/27/2020 1359   CALCIUM 10.1 05/27/2020 1359   PROT 7.7 02/21/2020 1422   ALBUMIN 4.2 02/21/2020 1422   AST 18 02/21/2020 1422   ALT 18 02/21/2020 1422   ALKPHOS 95 02/21/2020 1422   BILITOT 0.6 02/21/2020 1422   GFRNONAA >60 08/29/2015 0630   GFRAA >60 08/29/2015 0630    Assessment: 1.  Dysphagia: Worse over the past few months, 2 pills and even water; consider esophagitis +/- stricture +/- dysmotility versus other 2.  Bloating/gas/belching: Likely related to above/gastritis 3.  Family history of colon cancer: In her brother, we do not have record of her last colonoscopy but she tells me it was in the past 2 to 3 years  Plan: 1.  We will request records from Dr. Benson Norway, specifically in regards to last colonoscopy to ensure the patient is up-to-date. 2.  Scheduled the patient for an EGD with dilation in the [place] with Dr. Ardis Hughs.  Did discuss risks, benefits, limitations and alternatives and the patient agrees to proceed. 3.  Stop Omeprazole.  Started patient on Pantoprazole 40 mg twice daily, 30-60 minutes before breakfast and dinner #60 with 5 refills. 4.  Continue Pepcid 40 mg nightly 5.  Discussed antidysphagia measures. 6.  Patient to follow in clinic per recommendations from Dr. Ardis Hughs after time procedure.  Ellouise Newer, PA-C [facility] 07/02/2020, 10:32 AM  Cc: Janith Lima, MD

## 2020-07-02 NOTE — Assessment & Plan Note (Signed)
Arthritic changes mostly of the medial patellofemoral compartment.  Patient has no instability patient continues to be very active and encouraged her to still try to be.  Worsening pain will consider injection.  X-rays pending.  Topical anti-inflammatories given

## 2020-07-02 NOTE — Progress Notes (Signed)
Valerie [name] [address] [place] Phone: [phone] Subjective:   I Valerie [name] am serving as a Education administrator for Dr. Hulan Saas.  This visit occurred during the SARS-CoV-2 public health emergency.  Safety protocols were in place, including screening questions prior to the visit, additional usage of staff PPE, and extensive cleaning of exam room while observing appropriate contact time as indicated for disinfecting solutions.   I'm seeing this patient by the request  of:  Valerie Lima, MD  CC: Back pain and low back pain and knee pain  [id number]   05/21/2020 Severe known degenerative disc disease.  Patient secondary to body habitus and social determinants of health with her being the primary caregiver for her handicapped son and she is unable to have any surgical intervention.  Is responding well to the gabapentin.  Discussed potentially increasing the gabapentin to 100 mg twice daily during the day and 200 mg at night.  Prescription sent.  I believe patient should do relatively well.  We discussed many different alternatives including formal physical therapy which patient declined at the moment.  Discussed icing regimen and home exercises.  Discussed the importance of weight loss.  Follow-up again in 4 to 8 weeks.  Total time with patient today as well as reviewing patient's imaging 33 minutes.  07/02/2020 [name] is a 74 y.o. female coming in with complaint of back pain. Patient states she hasn't had much change in her back. Right knee pain as well. States it started hurting one day and she could hardly walk.   Onset-  Location- lateral  Duration-  Character- achy  Aggravating factors- laying down, walking  Reliving factors-  Therapies tried-  Severity-     Past Medical History:  Diagnosis Date  . Anemia   . Anxiety and depression   . Arthritis   . Asthma   . Cataract   . Degenerative arthritis   . Depression   .  Diabetes mellitus, type 2 ([place])   . Fatigue   . GERD (gastroesophageal reflux disease)   . Glaucoma   . Heart palpitations   . Hemorrhoids   . Hyperlipidemia   . Hypertension   . Hypothyroidism   . Hypoxemia 11/24/2013  . Memory deficit 10/04/2013  . Obesity   . Sleep apnea    BiPAP  . Snoring disorder    Past Surgical History:  Procedure Laterality Date  . benign tumors resected    . CATARACT EXTRACTION Left   . FOOT SURGERY Left    bone spur  . REFRACTIVE SURGERY     Glaucoma  . ROTATOR CUFF REPAIR Left   . TUBAL LIGATION     Social History   Socioeconomic History  . Marital status: Widowed    Spouse name: Not on file  . Number of children: 3  . Years of education: 11th  . Highest education level: 11th grade  Occupational History  . Occupation: disabled  Tobacco Use  . Smoking status: Never Smoker  . Smokeless tobacco: Never Used  Vaping Use  . Vaping Use: Never used  Substance and Sexual Activity  . Alcohol use: No    Alcohol/week: 0.0 standard drinks  . Drug use: No  . Sexual activity: Not Currently    Comment: not working, lives with husband, 3 sons  Other Topics Concern  . Not on file  Social History Narrative   Client's spouse recently passed Feb 2021    also caregiver  for son who is 25 yo and is disabled.   Social Determinants of Health   Financial Resource Strain: Low Risk   . Difficulty of Paying Living Expenses: Not hard at all  Food Insecurity: No Food Insecurity  . Worried About Charity fundraiser in the Last Year: Never true  . Ran Out of Food in the Last Year: Never true  Transportation Needs: No Transportation Needs  . Lack of Transportation (Medical): No  . Lack of Transportation (Non-Medical): No  Physical Activity: Inactive  . Days of Exercise per Week: 0 days  . Minutes of Exercise per Session: 0 min  Stress: Stress Concern Present  . Feeling of Stress : Rather much  Social Connections: Moderately Integrated  . Frequency of  Communication with Friends and Family: More than three times a week  . Frequency of Social Gatherings with Friends and Family: More than three times a week  . Attends Religious Services: More than 4 times per year  . Active Member of Clubs or Organizations: Yes  . Attends Archivist Meetings: More than 4 times per year  . Marital Status: Widowed   Allergies  Allergen Reactions  . Food Swelling    bananas  . Penicillins Swelling and Rash   Family History  Problem Relation Age of Onset  . Alcohol abuse Mother   . Heart attack Mother   . Coronary artery disease Brother   . Heart attack Brother   . Hypertension Brother   . Hyperlipidemia Brother   . Heart attack Father   . Hypertension Father   . Hyperlipidemia Father   . Heart disease Sister   . Atrial fibrillation Sister   . Hypertension Sister   . Hyperlipidemia Sister   . Hypertension Other        family history  . Alcohol abuse Other   . Colon cancer Brother 50  . Hypertension Brother   . Hyperlipidemia Brother   . Dementia Brother   . Hypertension Brother   . Hyperlipidemia Brother   . Diabetes Son   . Hypertension Son   . Hyperlipidemia Son   . Diabetes Son   . Hypertension Son   . Hyperlipidemia Son   . Cerebral palsy Son   . Hypertension Son   . Hyperlipidemia Son     Current Outpatient Medications (Endocrine & Metabolic):  [name]  Dulaglutide (TRULICITY) 1.5 PH/1.5AV SOPN, Inject 0.5 mLs (1.5 mg total) into the skin once a week. .  Empagliflozin-metFORMIN HCl (SYNJARDY) 12.5-500 MG TABS, Take 1 tablet by mouth 2 (two) times daily.  Current Outpatient Medications (Cardiovascular):  .  atorvastatin (LIPITOR) 20 MG tablet, TAKE 1 TABLET BY MOUTH EVERY DAY .  carvedilol (COREG) 25 MG tablet, TAKE 1 TABLET BY MOUTH TWICE DAILY WITH MEALS .  chlorthalidone (HYGROTON) 25 MG tablet, TAKE 1 TABLET BY MOUTH EVERY DAY .  irbesartan (AVAPRO) 300 MG tablet, Take 0.5 tablets (150 mg total) by mouth daily. (Patient  taking differently: Take 150 mg by mouth as needed. )  Current Outpatient Medications (Respiratory):  .  albuterol (PROVENTIL) (2.5 MG/3ML) 0.083% nebulizer solution,  .  albuterol (VENTOLIN HFA) 108 (90 Base) MCG/ACT inhaler, Inhale 2 puffs into the lungs every 6 (six) hours as needed. For shortness of breath  Current Outpatient Medications (Analgesics):  .  aspirin 81 MG tablet, Take 81 mg by mouth daily. [name]  oxyCODONE-acetaminophen (PERCOCET/ROXICET) 5-325 MG tablet, Take 1 tablet by mouth every 8 (eight) hours as needed for severe pain.  Current Outpatient Medications (Hematological):  .  cyanocobalamin 2000 MCG tablet, Take 1 tablet (2,000 mcg total) by mouth daily. .  ferrous sulfate 325 (65 FE) MG tablet, TAKE 1 TABLET BY MOUTH TWICE DAILY WITH A MEAL  Current Outpatient Medications (Other):  [name]  Cholecalciferol (VITAMIN D3) 1.25 MG (50000 UT) CAPS, Take 1 capsule by mouth once a week .  DULoxetine (CYMBALTA) 30 MG capsule, Take 1 capsule (30 mg total) by mouth daily. .  famotidine (PEPCID) 40 MG tablet, Take 1 tablet (40 mg total) by mouth at bedtime. .  Fluocinonide Emulsified Base 0.05 % CREA, APPLY  CREAM EXTERNALLY TO AFFECTED AREA TWICE DAILY .  gabapentin (NEURONTIN) 100 MG capsule, Take 2 capsules (200 mg total) by mouth 2 (two) times daily. [name]  gabapentin (NEURONTIN) 300 MG capsule, TAKE 1 CAPSULE BY MOUTH THREE TIMES DAILY .  glucose blood (FREESTYLE TEST STRIPS) test strip, USE TO TEST BLOOD SUGAR 3 TIMES A DAY. DX: E11.8 .  Insulin Pen Needle 31G X 5 MM MISC, Use once daily with insulin .  latanoprost (XALATAN) 0.005 % ophthalmic solution, Place 1 drop into both eyes at bedtime. [name]  MYRBETRIQ 25 MG TB24 tablet, TAKE 1 TABLET BY MOUTH EVERY DAY .  nystatin ointment (MYCOSTATIN), APPLY ON THE SKIN THREE TIMES DAILY .  omeprazole (PRILOSEC) 40 MG capsule, Take 1 capsule (40 mg total) by mouth in the morning and at bedtime. .  pantoprazole (PROTONIX) 40 MG tablet, Take 1 tablet  (40 mg total) by mouth 2 (two) times daily before a meal. .  potassium chloride SA (KLOR-CON M20) 20 MEQ tablet, TAKE 1 TABLET BY MOUTH TWICE A DAY .  thiamine (CVS B-1) 100 MG tablet, Take 1 tablet (100 mg total) by mouth daily.   Reviewed prior external information including notes and imaging from  primary care provider As well as notes that were available from care everywhere and other healthcare systems.  Past medical history, social, surgical and family history all reviewed in electronic medical record.  No pertanent information unless stated regarding to the chief complaint.   Review of Systems:  No headache, visual changes, nausea, vomiting, diarrhea, constipation, dizziness, abdominal pain, skin rash, fevers, chills, night sweats, weight loss, swollen lymph nodes,  joint swelling, chest pain, shortness of breath, mood changes. POSITIVE muscle aches, body aches  Objective  Blood pressure 140/78, pulse 94, height 5\' 5"  (1.651 m), weight 253 lb (114.8 kg), SpO2 98 %.   General: No apparent distress alert and oriented x3 mood and affect normal, dressed appropriately.  Respiratory: Patient's speak in full sentences and does not appear short of breath  Cardiovascular: Trace lower extremity edema, non tender, no erythema  Neuro: Cranial nerves II through XII are intact, neurovascularly intact in all extremities with 2+ DTRs and 2+ pulses.    MSK antalgic favoring the right knee.  Knee: Right valgus deformity noted.  Abnormal thigh to calf ratio.  Tender to palpation over medial and PF joint line.  ROM full in flexion and extension and lower leg rotation. instability with valgus force.  painful patellar compression. Patellar glide with moderate crepitus. Patellar and quadriceps tendons unremarkable. Hamstring and quadriceps strength is normal. Contralateral knee shows mild arthritic changes but no pain      Impression and Recommendations:     The above documentation has been  reviewed and is accurate and complete Lyndal Pulley, DO       Note: This dictation was prepared with Dragon dictation along  with smaller phrase technology. Any transcriptional errors that result from this process are unintentional.

## 2020-07-02 NOTE — Patient Instructions (Signed)
Good to see you! X-ray of knee today pennsaid pinkie amount topically 2 times daily as needed. Continue same plan with the back  See you again in 6-8 weeks

## 2020-07-03 ENCOUNTER — Telehealth: Payer: Self-pay | Admitting: Internal Medicine

## 2020-07-03 ENCOUNTER — Telehealth: Payer: Self-pay

## 2020-07-03 DIAGNOSIS — E559 Vitamin D deficiency, unspecified: Secondary | ICD-10-CM

## 2020-07-03 DIAGNOSIS — D518 Other vitamin B12 deficiency anemias: Secondary | ICD-10-CM

## 2020-07-03 NOTE — Telephone Encounter (Signed)
Author: Milus Banister, MD Service: Gastroenterology Author Type: Physician  Filed: 07/03/2020 5:19 AM Encounter Date: 07/02/2020 Status: Signed  Editor: Milus Banister, MD (Physician)     Show:Clear all [x] Manual[] Template[] Copied  Added by: [x] Milus Banister, MD  [] Hover for details I agree with the above note, plan.  I believe that she suffered some type of complication following a Dr. Benson Norway EGD and before going ahead with EGD myself I'd like to understand that event a bit better.   Valerie [name], Please cancel her upcoming EGD and see about getting those Dr. Benson Norway records. Let her know after I review them we will get back in touch about rescheduling.  Thanks

## 2020-07-03 NOTE — Telephone Encounter (Signed)
The pt has been advised of the cancellation and states she signed a release in the office yesterday for records.  She is aware we will call her with recommendations as soon as Dr Ardis Hughs sees the records from Dr Benson Norway.  The pt has been advised of the information and verbalized understanding.

## 2020-07-03 NOTE — Telephone Encounter (Signed)
   Patient requesting refill for Cholecalciferol (VITAMIN D3) 1.25 MG (50000 UT) CAPS and B12 Pharmacy [id number] [place] [address]

## 2020-07-03 NOTE — Progress Notes (Signed)
I agree with the above note, plan.  I believe that she suffered some type of complication following a Dr. Benson Norway EGD and before going ahead with EGD myself I'd like to understand that event a bit better.   Valerie [name], Please cancel her upcoming EGD and see about getting those Dr. Benson Norway records. Let her know after I review them we will get back in touch about rescheduling.  Thanks

## 2020-07-03 NOTE — Telephone Encounter (Signed)
Left message on machine to call back EGD has been cancelled. Will ask pt to try and get Dr Benson Norway records faxed to our office.

## 2020-07-03 NOTE — Telephone Encounter (Signed)
-----   Message from Milus Banister, MD sent at 07/03/2020  5:17 AM EDT -----   ----- Message ----- From: Levin Erp, PA Sent: 07/02/2020  11:26 AM EDT To: Milus Banister, MD

## 2020-07-04 MED ORDER — CHOLECALCIFEROL 50 MCG (2000 UT) PO TABS: 1.0000 | ORAL_TABLET | Freq: Every day | ORAL | 1 refills | Status: DC

## 2020-07-05 LAB — CBC WITH DIFFERENTIAL/PLATELET
Absolute Monocytes: 894 cells/uL (ref 200–950)
Basophils Absolute: 52 cells/uL (ref 0–200)
Basophils Relative: 0.5 %
Eosinophils Absolute: 343 cells/uL (ref 15–500)
Eosinophils Relative: 3.3 %
HCT: 35.3 % (ref 35.0–45.0)
Hemoglobin: 11.8 g/dL (ref 11.7–15.5)
Lymphs Abs: 2683 cells/uL (ref 850–3900)
MCH: 29.7 pg (ref 27.0–33.0)
MCHC: 33.4 g/dL (ref 32.0–36.0)
MCV: 88.9 fL (ref 80.0–100.0)
MPV: 10 fL (ref 7.5–12.5)
Monocytes Relative: 8.6 %
Neutro Abs: 6427 cells/uL (ref 1500–7800)
Neutrophils Relative %: 61.8 %
Platelets: 359 10*3/uL (ref 140–400)
RBC: 3.97 10*6/uL (ref 3.80–5.10)
RDW: 13.8 % (ref 11.0–15.0)
Total Lymphocyte: 25.8 %
WBC: 10.4 10*3/uL (ref 3.8–10.8)

## 2020-07-05 LAB — TSH: TSH: 3.79 mIU/L (ref 0.40–4.50)

## 2020-07-05 LAB — MICROALBUMIN / CREATININE URINE RATIO
Creatinine, Urine: 62 mg/dL (ref 20–275)
Microalb Creat Ratio: 8 mcg/mg creat (ref <30)
Microalb, Ur: 0.5 mg/dL

## 2020-07-05 LAB — VITAMIN B12: Vitamin B-12: >2000 pg/mL — ABNORMAL HIGH (ref 200–1100)

## 2020-07-05 LAB — VITAMIN B1: Vitamin B1 (Thiamine): 33 nmol/L — ABNORMAL HIGH (ref 8–30)

## 2020-07-05 LAB — VITAMIN D 25 HYDROXY (VIT D DEFICIENCY, FRACTURES): Vit D, 25-Hydroxy: 36 ng/mL (ref 30–100)

## 2020-07-05 LAB — FOLATE: Folate: 9.8 ng/mL

## 2020-07-05 LAB — FERRITIN: Ferritin: 99 ng/mL (ref 16–288)

## 2020-07-05 MED ORDER — CYANOCOBALAMIN 2000 MCG PO TABS: 2000.0000 ug | ORAL_TABLET | Freq: Every day | ORAL | 1 refills | Status: DC

## 2020-07-05 MED ORDER — CHOLECALCIFEROL 50 MCG (2000 UT) PO TABS: 1.0000 | ORAL_TABLET | Freq: Every day | ORAL | 1 refills | Status: DC

## 2020-07-05 NOTE — Telephone Encounter (Signed)
Pt erx for vitamin d and b12 was sent in.

## 2020-07-05 NOTE — Addendum Note (Signed)
Addended by: Aviva Signs M on: 07/05/2020 10:25 AM   Modules accepted: Orders

## 2020-07-06 ENCOUNTER — Encounter: Payer: Self-pay | Admitting: Internal Medicine

## 2020-07-08 NOTE — Telephone Encounter (Signed)
    Patient made aware to stop B1 and B12

## 2020-07-08 NOTE — Telephone Encounter (Signed)
Per PCP - Pt to stop taking the B1 and B12.   LVM for pt to call back.   May inform her of the same.

## 2020-07-10 ENCOUNTER — Telehealth: Payer: Self-pay | Admitting: Internal Medicine

## 2020-07-10 ENCOUNTER — Ambulatory Visit: Payer: HMO | Admitting: Adult Health

## 2020-07-10 NOTE — Progress Notes (Signed)
  Chronic Care Management   Outreach Note  07/10/2020 Name: SHAOLIN ARMAS MRN: [id number] DOB: 12/06/1946  Referred by: Janith Lima, MD Reason for referral : No chief complaint on file.   An unsuccessful telephone outreach was attempted today. The patient was referred to the pharmacist for assistance with care management and care coordination.   Follow Up Plan:   [name]

## 2020-07-11 ENCOUNTER — Telehealth: Payer: Self-pay | Admitting: Internal Medicine

## 2020-07-11 NOTE — Progress Notes (Signed)
  Chronic Care Management   Outreach Note  07/11/2020 Name: Valerie [name] MRN: [id number] DOB: 15-Mar-1946  Referred by: Janith Lima, MD Reason for referral : No chief complaint on file.   An unsuccessful telephone outreach was attempted today. The patient was referred to the pharmacist for assistance with care management and care coordination.   Follow Up Plan:   [name]

## 2020-07-19 NOTE — Telephone Encounter (Signed)
Looks like Valerie [name] was with her at the office visit.

## 2020-07-19 NOTE — Telephone Encounter (Signed)
This will need to go to the Lima.  The records release was signed at an office visit.  I do not get the records thanks.

## 2020-07-19 NOTE — Telephone Encounter (Signed)
Pt is requesting a call back from a nurse, pt states Dr Ardis Hughs office needs to fax over a request first in order to obtain her medical records from Dr Benson Norway.

## 2020-07-25 ENCOUNTER — Telehealth: Payer: Self-pay | Admitting: Internal Medicine

## 2020-07-25 ENCOUNTER — Other Ambulatory Visit: Payer: Self-pay | Admitting: Internal Medicine

## 2020-07-25 DIAGNOSIS — F331 Major depressive disorder, recurrent, moderate: Secondary | ICD-10-CM

## 2020-07-25 DIAGNOSIS — M8949 Other hypertrophic osteoarthropathy, multiple sites: Secondary | ICD-10-CM

## 2020-07-25 DIAGNOSIS — M159 Polyosteoarthritis, unspecified: Secondary | ICD-10-CM

## 2020-07-25 DIAGNOSIS — M150 Primary generalized (osteo)arthritis: Secondary | ICD-10-CM

## 2020-07-25 DIAGNOSIS — G4733 Obstructive sleep apnea (adult) (pediatric): Secondary | ICD-10-CM | POA: Diagnosis not present

## 2020-07-25 DIAGNOSIS — G479 Sleep disorder, unspecified: Secondary | ICD-10-CM | POA: Diagnosis not present

## 2020-07-25 MED ORDER — DULOXETINE HCL 60 MG PO CPEP: 60.0000 mg | ORAL_CAPSULE | Freq: Every day | ORAL | 1 refills | Status: DC

## 2020-07-25 NOTE — Progress Notes (Signed)
  Chronic Care Management   Note  07/25/2020 Name: Valerie [name] MRN: [id number] DOB: 09-Jul-1946  Valerie [name] is a 74 y.o. year old female who is a primary care patient of Janith Lima, MD. I reached out to [name] by phone today in response to a referral sent by [name] PCP, Janith Lima, MD.   [name] was given information about Chronic Care Management services today including:  1. CCM service includes personalized support from designated clinical staff supervised by her physician, including individualized plan of care and coordination with other care providers 2. 24/7 contact phone numbers for assistance for urgent and routine care needs. 3. Service will only be billed when office clinical staff spend 20 minutes or more in a month to coordinate care. 4. Only one practitioner may furnish and bill the service in a calendar month. 5. The patient may stop CCM services at any time (effective at the end of the month) by phone call to the office staff.   Patient agreed to services and verbal consent obtained.   Follow up plan:   [name]

## 2020-08-01 ENCOUNTER — Ambulatory Visit: Payer: HMO | Admitting: Physician Assistant

## 2020-08-01 ENCOUNTER — Other Ambulatory Visit: Payer: Self-pay

## 2020-08-01 VITALS — BP 120/70 | HR 84 | Ht 65.0 in | Wt 249.8 lb

## 2020-08-01 DIAGNOSIS — E785 Hyperlipidemia, unspecified: Secondary | ICD-10-CM | POA: Diagnosis not present

## 2020-08-01 DIAGNOSIS — I10 Essential (primary) hypertension: Secondary | ICD-10-CM

## 2020-08-01 DIAGNOSIS — G4733 Obstructive sleep apnea (adult) (pediatric): Secondary | ICD-10-CM

## 2020-08-01 DIAGNOSIS — R002 Palpitations: Secondary | ICD-10-CM | POA: Diagnosis not present

## 2020-08-01 DIAGNOSIS — E119 Type 2 diabetes mellitus without complications: Secondary | ICD-10-CM

## 2020-08-01 DIAGNOSIS — E039 Hypothyroidism, unspecified: Secondary | ICD-10-CM | POA: Diagnosis not present

## 2020-08-01 DIAGNOSIS — R0602 Shortness of breath: Secondary | ICD-10-CM

## 2020-08-01 NOTE — Progress Notes (Signed)
Cardiology Office Note:    Date:  08/03/2020   ID:  Valerie [name], DOB [birth year], MRN [id number]  PCP:  Janith Lima, MD  [facility] Cardiologist:  Quay Burow, MD  [place] Electrophysiologist:  None   Referring MD: Janith Lima, MD   Chief Complaint  Patient presents with  . Follow-up    seen for Dr. Gwenlyn Found.    History of Present Illness:    Valerie [name] is a 74 y.o. female with a hx of hypertension, hyperlipidemia, DM 2, hypothyroidism, obstructive sleep apnea on BiPAP therapy, obesity and history of palpitation.  Previous echocardiogram obtained on 10/04/2018 showed EF 60 to 65%, grade 1 DD, no significant valve issue.  Myoview obtained on 10/07/2018 showed EF 75%, normal perfusion, overall low risk study. She does have significant family history of CAD with her father and brother died of MI.  However she herself has never had a heart attack or stroke.  She does have chronic chest pain once every few days and chronically short of breath.  It was suspected her dyspnea is related to reactive airway disease. Due to symptom of palpitation, she wore a heart monitor in October 2018 that showed sinus rhythm, occasional PVC and PACs.  Due to recurrent palpitation, a longer event monitor was placed in March 2021 that showed occasional PVCs and short atrial runs.  Patient was seen by his PCP in June for intermittent sharp pain with belching and dysphagia.  This was felt to be GI in nature.  High-sensitivity troponin obtained was negative.  Patient presents today for follow-up. She still have occasional skipped heartbeat and chest discomfort. Her chest discomfort is chronic and does not worsen with physical activity. She denies any recent increase in frequency or duration of the chest pain. I recommended continue on the current medication. She is accompanied by her son, both of them had the [place] COVID-19 vaccine and was asking about the potential third shot booster.   Past  Medical History:  Diagnosis Date  . Anemia   . Anxiety and depression   . Arthritis   . Asthma   . Cataract   . Degenerative arthritis   . Depression   . Diabetes mellitus, type 2 ([place])   . Fatigue   . GERD (gastroesophageal reflux disease)   . Glaucoma   . Heart palpitations   . Hemorrhoids   . Hyperlipidemia   . Hypertension   . Hypothyroidism   . Hypoxemia 11/24/2013  . Memory deficit 10/04/2013  . Obesity   . Sleep apnea    BiPAP  . Snoring disorder     Past Surgical History:  Procedure Laterality Date  . benign tumors resected    . CATARACT EXTRACTION Left   . FOOT SURGERY Left    bone spur  . REFRACTIVE SURGERY     Glaucoma  . ROTATOR CUFF REPAIR Left   . TUBAL LIGATION      Current Medications: Current Meds  Medication Sig  . albuterol (PROVENTIL) (2.5 MG/3ML) 0.083% nebulizer solution   . albuterol (VENTOLIN HFA) 108 (90 Base) MCG/ACT inhaler Inhale 2 puffs into the lungs every 6 (six) hours as needed. For shortness of breath  . aspirin 81 MG tablet Take 81 mg by mouth daily.   [name] atorvastatin (LIPITOR) 20 MG tablet TAKE 1 TABLET BY MOUTH EVERY DAY  . carvedilol (COREG) 25 MG tablet TAKE 1 TABLET BY MOUTH TWICE DAILY WITH MEALS  . chlorthalidone (HYGROTON) 25 MG tablet TAKE  1 TABLET BY MOUTH EVERY DAY  . Cholecalciferol 50 MCG (2000 UT) TABS Take 1 tablet (2,000 Units total) by mouth daily.  . cyanocobalamin 2000 MCG tablet Take 1 tablet (2,000 mcg total) by mouth daily.  . Dulaglutide (TRULICITY) 1.5 YH/0.6CB SOPN Inject 0.5 mLs (1.5 mg total) into the skin once a week.  . DULoxetine (CYMBALTA) 60 MG capsule Take 1 capsule (60 mg total) by mouth daily.  . Empagliflozin-metFORMIN HCl (SYNJARDY) 12.5-500 MG TABS Take 1 tablet by mouth 2 (two) times daily.  . famotidine (PEPCID) 40 MG tablet Take 1 tablet (40 mg total) by mouth at bedtime.  . ferrous sulfate 325 (65 FE) MG tablet TAKE 1 TABLET BY MOUTH TWICE DAILY WITH A MEAL  . Fluocinonide Emulsified Base  0.05 % CREA APPLY  CREAM EXTERNALLY TO AFFECTED AREA TWICE DAILY  . gabapentin (NEURONTIN) 100 MG capsule Take 2 capsules (200 mg total) by mouth 2 (two) times daily.  [name] gabapentin (NEURONTIN) 300 MG capsule TAKE 1 CAPSULE BY MOUTH THREE TIMES DAILY  . glucose blood (FREESTYLE TEST STRIPS) test strip USE TO TEST BLOOD SUGAR 3 TIMES A DAY. DX: E11.8  . Insulin Pen Needle 31G X 5 MM MISC Use once daily with insulin  . irbesartan (AVAPRO) 300 MG tablet Take 0.5 tablets (150 mg total) by mouth daily.  [name] latanoprost (XALATAN) 0.005 % ophthalmic solution Place 1 drop into both eyes at bedtime.   [name] MYRBETRIQ 25 MG TB24 tablet TAKE 1 TABLET BY MOUTH EVERY DAY  . nystatin ointment (MYCOSTATIN) APPLY ON THE SKIN THREE TIMES DAILY  . omeprazole (PRILOSEC) 40 MG capsule Take 1 capsule (40 mg total) by mouth in the morning and at bedtime.  [name] oxyCODONE-acetaminophen (PERCOCET/ROXICET) 5-325 MG tablet Take 1 tablet by mouth every 8 (eight) hours as needed for severe pain.  . pantoprazole (PROTONIX) 40 MG tablet Take 1 tablet (40 mg total) by mouth 2 (two) times daily before a meal.  . potassium chloride SA (KLOR-CON M20) 20 MEQ tablet TAKE 1 TABLET BY MOUTH TWICE A DAY  . thiamine ([employer] B-1) 100 MG tablet Take 1 tablet (100 mg total) by mouth daily.     Allergies:   Food and Penicillins   Social History   Socioeconomic History  . Marital status: Widowed    Spouse name: Not on file  . Number of children: 3  . Years of education: 11th  . Highest education level: 11th grade  Occupational History  . Occupation: disabled  Tobacco Use  . Smoking status: Never Smoker  . Smokeless tobacco: Never Used  Vaping Use  . Vaping Use: Never used  Substance and Sexual Activity  . Alcohol use: No    Alcohol/week: 0.0 standard drinks  . Drug use: No  . Sexual activity: Not Currently    Comment: not working, lives with husband, 3 sons  Other Topics Concern  . Not on file  Social History Narrative   Client's  spouse recently passed Feb 2021    also caregiver for son who is 8 yo and is disabled.   Social Determinants of Health   Financial Resource Strain: Low Risk   . Difficulty of Paying Living Expenses: Not hard at all  Food Insecurity: No Food Insecurity  . Worried About Charity fundraiser in the Last Year: Never true  . Ran Out of Food in the Last Year: Never true  Transportation Needs: No Transportation Needs  . Lack of Transportation (Medical): No  . Lack  of Transportation (Non-Medical): No  Physical Activity: Inactive  . Days of Exercise per Week: 0 days  . Minutes of Exercise per Session: 0 min  Stress: Stress Concern Present  . Feeling of Stress : Rather much  Social Connections: Moderately Integrated  . Frequency of Communication with Friends and Family: More than three times a week  . Frequency of Social Gatherings with Friends and Family: More than three times a week  . Attends Religious Services: More than 4 times per year  . Active Member of Clubs or Organizations: Yes  . Attends Archivist Meetings: More than 4 times per year  . Marital Status: Widowed     Family History: The patient's family history includes Alcohol abuse in her mother and another family member; Atrial fibrillation in her sister; Cerebral palsy in her son; Colon cancer (age of onset: 37) in her brother; Coronary artery disease in her brother; Dementia in her brother; Diabetes in her son and son; Heart attack in her brother, father, and mother; Heart disease in her sister; Hyperlipidemia in her brother, brother, brother, father, sister, son, son, and son; Hypertension in her brother, brother, brother, father, sister, son, son, son, and another family member.  ROS:   Please see the history of present illness.     All other systems reviewed and are negative.  EKGs/Labs/Other Studies Reviewed:    The following studies were reviewed today:  Echo 10/03/2018 LV EF: 60% -  65%    -------------------------------------------------------------------  Indications:   Chest Pain (R07.89).   -------------------------------------------------------------------  History:  PMH: OSA. Dyspnea. Risk factors: Hypertension.  Diabetes mellitus.   -------------------------------------------------------------------  Study Conclusions   - Left ventricle: The cavity size was normal. Wall thickness was  increased in a pattern of mild LVH. Systolic function was normal.  The estimated ejection fraction was in the range of 60% to 65%.  Wall motion was normal; there were no regional wall motion  abnormalities. Doppler parameters are consistent with abnormal  left ventricular relaxation (grade 1 diastolic dysfunction).  Doppler parameters are consistent with indeterminate ventricular  filling pressure.  - Aortic valve: Transvalvular velocity was within the normal range.  There was no stenosis. There was no regurgitation.  - Mitral valve: Transvalvular velocity was within the normal range.  There was no evidence for stenosis. There was no regurgitation.  - Right ventricle: The cavity size was normal. Wall thickness was  normal. Systolic function was normal.  - Atrial septum: No defect or patent foramen ovale was identified  by color flow Doppler.  - Tricuspid valve: There was no regurgitation.  - Global longitudinal strain -24.6% (normal).    Myoview 10/07/2018  Nuclear stress EF: 75%.  Normal perfusion  The study is normal.  This is a low risk study.     EKG:  EKG is ordered today.  The ekg ordered today demonstrates NSR without significant ST-T wave changes  Recent Labs: 02/21/2020: ALT 18 03/27/2020: Magnesium 1.5 05/27/2020: BUN 20; Creatinine, Ser 0.91; Potassium 3.5; Sodium 137 07/01/2020: Hemoglobin 11.8; Platelets 359; TSH 3.79  Recent Lipid Panel    Component Value Date/Time   CHOL 144 02/21/2020 1422   TRIG 272.0 (H) 02/21/2020  1422   HDL 47.30 02/21/2020 1422   CHOLHDL 3 02/21/2020 1422   VLDL 54.4 (H) 02/21/2020 1422   LDLCALC 43 03/28/2014 1511   LDLDIRECT 57.0 02/21/2020 1422    Physical Exam:    VS:  BP 120/70   Pulse 84   Ht 5'  5" (1.651 m)   Wt 249 lb 12.8 oz (113.3 kg)   SpO2 97%   BMI 41.57 kg/m     Wt Readings from Last 3 Encounters:  08/01/20 249 lb 12.8 oz (113.3 kg)  07/02/20 253 lb (114.8 kg)  07/02/20 251 lb 4 oz (114 kg)     GEN:  Well nourished, well developed in no acute distress HEENT: Normal NECK: No JVD; No carotid bruits LYMPHATICS: No lymphadenopathy CARDIAC: RRR, no murmurs, rubs, gallops RESPIRATORY:  Clear to auscultation without rales, wheezing or rhonchi  ABDOMEN: Soft, non-tender, non-distended MUSCULOSKELETAL:  No edema; No deformity  SKIN: Warm and dry NEUROLOGIC:  Alert and oriented x 3 PSYCHIATRIC:  Normal affect   ASSESSMENT:    1. Palpitations   2. Hyperlipidemia with target LDL less than 100   3. Essential hypertension   4. Controlled type 2 diabetes mellitus without complication, without long-term current use of insulin ([place])   5. Hypothyroidism, unspecified type   6. OSA treated with BiPAP    PLAN:    In order of problems listed above:  1. Palpitation: She described symptom associated with occasional skipped heartbeat. I recommended continue on the current therapy and monitor.  2. Hypertension: Blood pressure stable  3. Hyperlipidemia: On Lipitor  4. DM2: Managed by primary care provider  5. Hypothyroidism: On levothyroxine. Managed by primary care provider  6. Obstructive sleep apnea: On BiPAP therapy   Medication Adjustments/Labs and Tests Ordered: Current medicines are reviewed at length with the patient today.  Concerns regarding medicines are outlined above.  Orders Placed This Encounter  Procedures  . EKG 12-Lead   No orders of the defined types were placed in this encounter.   Patient Instructions  Medication Instructions:    CONTINUE WITH CURRENT MEDICATIONS. NO CHANGES.  *If you need a refill on your cardiac medications before your next appointment, please call your pharmacy*   Follow-Up: At [facility], you and your health needs are our priority.  As part of our continuing mission to provide you with exceptional heart care, we have created designated Provider Care Teams.  These Care Teams include your primary Cardiologist (physician) and Advanced Practice Providers (APPs -  Physician Assistants and Nurse Practitioners) who all work together to provide you with the care you need, when you need it.  We recommend signing up for the patient portal called "MyChart".  Sign up information is provided on this After Visit Summary.  MyChart is used to connect with patients for Virtual Visits (Telemedicine).  Patients are able to view lab/test results, encounter notes, upcoming appointments, etc.  Non-urgent messages can be sent to your provider as well.   To learn more about what you can do with MyChart, go to [email or web address].    Your next appointment:   3 month(s)  The format for your next appointment:   In Person  Provider:   Quay Burow, MD      Signed, Almyra Deforest, [place]  08/03/2020 11:37 PM    [place]

## 2020-08-01 NOTE — Patient Instructions (Signed)
Medication Instructions:  CONTINUE WITH CURRENT MEDICATIONS. NO CHANGES.  *If you need a refill on your cardiac medications before your next appointment, please call your pharmacy*   Follow-Up: At [facility], you and your health needs are our priority.  As part of our continuing mission to provide you with exceptional heart care, we have created designated Provider Care Teams.  These Care Teams include your primary Cardiologist (physician) and Advanced Practice Providers (APPs -  Physician Assistants and Nurse Practitioners) who all work together to provide you with the care you need, when you need it.  We recommend signing up for the patient portal called "MyChart".  Sign up information is provided on this After Visit Summary.  MyChart is used to connect with patients for Virtual Visits (Telemedicine).  Patients are able to view lab/test results, encounter notes, upcoming appointments, etc.  Non-urgent messages can be sent to your provider as well.   To learn more about what you can do with MyChart, go to [email or web address].    Your next appointment:   3 month(s)  The format for your next appointment:   In Person  Provider:   Quay Burow, MD

## 2020-08-03 ENCOUNTER — Encounter: Payer: Self-pay | Admitting: Physician Assistant

## 2020-08-04 DIAGNOSIS — H2511 Age-related nuclear cataract, right eye: Secondary | ICD-10-CM | POA: Diagnosis not present

## 2020-08-06 ENCOUNTER — Other Ambulatory Visit: Payer: Self-pay

## 2020-08-06 ENCOUNTER — Ambulatory Visit: Payer: HMO | Admitting: Pharmacist

## 2020-08-06 ENCOUNTER — Other Ambulatory Visit: Payer: Self-pay | Admitting: Internal Medicine

## 2020-08-06 DIAGNOSIS — I10 Essential (primary) hypertension: Secondary | ICD-10-CM

## 2020-08-06 DIAGNOSIS — E785 Hyperlipidemia, unspecified: Secondary | ICD-10-CM

## 2020-08-06 DIAGNOSIS — J4520 Mild intermittent asthma, uncomplicated: Secondary | ICD-10-CM

## 2020-08-06 DIAGNOSIS — E118 Type 2 diabetes mellitus with unspecified complications: Secondary | ICD-10-CM

## 2020-08-06 DIAGNOSIS — F331 Major depressive disorder, recurrent, moderate: Secondary | ICD-10-CM

## 2020-08-06 NOTE — Addendum Note (Signed)
Addended by: Karle Barr on: 08/06/2020 04:47 PM   Modules accepted: Orders

## 2020-08-06 NOTE — Chronic Care Management (AMB) (Signed)
Chronic Care Management Pharmacy  Name: ABBIGAIL ANSTEY  MRN: [id number] DOB: 23-Nov-1946   Chief Complaint/ HPI  [name],  74 y.o. , female presents for their Initial CCM visit with the clinical pharmacist via telephone due to COVID-19 Pandemic.  PCP : Janith Lima, MD Patient Care Team: Janith Lima, MD as PCP - General Gwenlyn Found Pearletha Forge, MD as PCP - Cardiology (Cardiology) Dohmeier, Asencion Partridge, MD as Consulting Physician (Neurology) Heath Lark, MD as Consulting Physician (Hematology and Oncology) Luretha Rued, RN as [facility] Charlton Haws, [facility] as Pharmacist (Pharmacist)  Their chronic conditions include: Hypertension, Hyperlipidemia, Diabetes, GERD, Asthma, Hypothyroidism, Depression, Anxiety, Osteoarthritis and Overactive Bladder, Fatty liver disease, OSA on BiPAP  Born and raised in [place], immediate family here; extended family in [place]; worked in [employer] information [employer] manager; 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 Her husband passed earlier this year and it has been very difficult for her and her disabled son she cares for.  Office Visits: 07/01/20 Dr Ronnald Ramp OV: chronic f/u, worsening depression. Start duloxetine 30 mg. Stop B12 and thiamine. Changed Vitamin D from 50K to 2000 IU.  05/27/20 Dr Ronnald Ramp OV: new chest pain, r/o anginal cause, likely GI. Rec'd to increase omeprazole to BID and add famotidine. Also referred to GI.  03/27/20 Dr Ronnald Ramp OV: c/o frequent urination, nocturia, wt gain. Started Myrbetriq 25 mg and Trulicity 2.12 mg. Refilled Percocet and referred to sports med.  Consult Visit: 08/01/20 PA Almyra Deforest (cardiology): c/o occaional palpitations, rec continue to monitor.  07/02/20 Dr Tamala Julian (sports med): Xray of knee - degenerative changes. Given Pennsaid.  07/02/20 PA Golden Circle (GI): f/u for GERD, bloating, dysphagia. Stop omeprazole, start pantoprazole 40 mg BID. Schedule EGD w/ dilation after records obtained from previous EGD  04/30/20 Dr  Gwenlyn Found (cardiology): LDL goal < 100. Palpitations w/ recent event monitor showing PVCs, short atrial runs. Rec'd abstain from caffeine.  Allergies  Allergen Reactions  . Food Swelling    bananas  . Penicillins Swelling and Rash    Medications: Outpatient Encounter Medications as of 08/06/2020  Medication Sig Note  . albuterol (PROVENTIL) (2.5 MG/3ML) 0.083% nebulizer solution    . albuterol (VENTOLIN HFA) 108 (90 Base) MCG/ACT inhaler Inhale 2 puffs into the lungs every 6 (six) hours as needed. For shortness of breath   . aspirin 81 MG tablet Take 81 mg by mouth daily.    [name] atorvastatin (LIPITOR) 20 MG tablet TAKE 1 TABLET BY MOUTH EVERY DAY   . carvedilol (COREG) 25 MG tablet TAKE 1 TABLET BY MOUTH TWICE DAILY WITH MEALS   . chlorthalidone (HYGROTON) 25 MG tablet TAKE 1 TABLET BY MOUTH EVERY DAY   . Cholecalciferol 50 MCG (2000 UT) TABS Take 1 tablet (2,000 Units total) by mouth daily.   . Dulaglutide (TRULICITY) 1.5 YQ/8.2NO SOPN Inject 0.5 mLs (1.5 mg total) into the skin once a week.   . Empagliflozin-metFORMIN HCl (SYNJARDY) 12.5-500 MG TABS Take 1 tablet by mouth 2 (two) times daily.   . famotidine (PEPCID) 40 MG tablet Take 1 tablet (40 mg total) by mouth at bedtime.   . ferrous sulfate 325 (65 FE) MG tablet TAKE 1 TABLET BY MOUTH TWICE DAILY WITH A MEAL   . Fluocinonide Emulsified Base 0.05 % CREA APPLY  CREAM EXTERNALLY TO AFFECTED AREA TWICE DAILY   . gabapentin (NEURONTIN) 100 MG capsule Take 2 capsules (200 mg total) by mouth 2 (two) times daily.   [name] gabapentin (NEURONTIN) 300 MG  capsule TAKE 1 CAPSULE BY MOUTH THREE TIMES DAILY   . glucose blood (FREESTYLE TEST STRIPS) test strip USE TO TEST BLOOD SUGAR 3 TIMES A DAY. DX: E11.8   . Insulin Pen Needle 31G X 5 MM MISC Use once daily with insulin   . irbesartan (AVAPRO) 300 MG tablet Take 0.5 tablets (150 mg total) by mouth daily. 08/06/2020: Patient takes when BP > 150/90  . latanoprost (XALATAN) 0.005 % ophthalmic solution Place  1 drop into both eyes at bedtime.    [name] MYRBETRIQ 25 MG TB24 tablet TAKE 1 TABLET BY MOUTH EVERY DAY   . nystatin ointment (MYCOSTATIN) APPLY ON THE SKIN THREE TIMES DAILY   . omeprazole (PRILOSEC) 40 MG capsule Take 1 capsule (40 mg total) by mouth in the morning and at bedtime.   [name] oxyCODONE-acetaminophen (PERCOCET/ROXICET) 5-325 MG tablet Take 1 tablet by mouth every 8 (eight) hours as needed for severe pain.   . pantoprazole (PROTONIX) 40 MG tablet Take 1 tablet (40 mg total) by mouth 2 (two) times daily before a meal.   . potassium chloride SA (KLOR-CON M20) 20 MEQ tablet TAKE 1 TABLET BY MOUTH TWICE A DAY   . cyanocobalamin 2000 MCG tablet Take 1 tablet (2,000 mcg total) by mouth daily. (Patient not taking: Reported on 08/06/2020)   . DULoxetine (CYMBALTA) 60 MG capsule Take 1 capsule (60 mg total) by mouth daily. (Patient not taking: Reported on 08/06/2020)   . thiamine ([facility] B-1) 100 MG tablet Take 1 tablet (100 mg total) by mouth daily. (Patient not taking: Reported on 08/06/2020)   . [DISCONTINUED] atorvastatin (LIPITOR) 20 MG tablet TAKE 1 TABLET BY MOUTH EVERY DAY   . [DISCONTINUED] chlorthalidone (HYGROTON) 25 MG tablet Take 1 tablet (25 mg total) by mouth daily.   . [DISCONTINUED] chlorthalidone (HYGROTON) 25 MG tablet TAKE 1 TABLET BY MOUTH EVERY DAY   . [DISCONTINUED] glucose blood (FREESTYLE TEST STRIPS) test strip Use to test blood sugar 3 times a day. DX: E11.8    No facility-administered encounter medications on file as of 08/06/2020.     Current Diagnosis/Assessment:  SDOH Interventions     Most Recent Value  SDOH Interventions  Financial Strain Interventions Other (Comment)  [Trulicity PAP,  Synjardy PAP refills]      Goals Addressed            This Visit's Progress   . Pharmacy Care Plan       CARE PLAN ENTRY (see longitudinal plan of care for additional care plan information)  Current Barriers:  . Chronic Disease Management support, education, and care  coordination needs related to Hypertension, Hyperlipidemia, Diabetes, Asthma, and Depression   Hypertension BP Readings from Last 3 Encounters:  08/01/20 120/70  07/02/20 140/78  07/02/20 120/76 .  Pharmacist Clinical Goal(s): o Over the next 120 days, patient will work with PharmD and providers to maintain BP goal <130/80 . Current regimen:  o Irbesartan 300 mg - 1/2 tablet daily o Chlorthalidone 25 mg daily o Carvedilol 25 mg twice a day . Interventions: o Discussed BP goals and benefits of medications for prevention of heart attack / stroke . Patient self care activities - Over the next  days, patient will: o Check BP daily, document, and provide at future appointments o Ensure daily salt intake < 2300 mg/day  Hyperlipidemia Lab Results  Component Value Date/Time   LDLCALC 43 03/28/2014 03:11 PM   LDLDIRECT 57.0 02/21/2020 02:22 PM .  Pharmacist Clinical Goal(s): o Over the next  120 days, patient will work with PharmD and providers to maintain LDL goal < 100 . Current regimen:  o Atorvastatin 20 mg daily o Aspirin 81 mg daily . Interventions: o Discussed cholesterol goals and benefits of medications for prevention of heart attack / stroke . Patient self care activities - Over the next 120 days, patient will: o Continue medication as prescribed  Diabetes Lab Results  Component Value Date/Time   HGBA1C 7.4 (H) 05/27/2020 01:59 PM   HGBA1C 7.3 (H) 02/21/2020 02:22 PM .  Pharmacist Clinical Goal(s): o Over the next 120 days, patient will work with PharmD and providers to achieve A1c goal <8% . Current regimen:  o Trulicity 1.5 mg weekly o Synjardy 12.5-500 mg twice a day (via PAP) . Interventions: o Discussed blood sugar goals and benefits of medications for prevention of diabetic complications o Pursue patient assistance for Trulicity via [employer] . Patient self care activities - Over the next 120 days, patient will: o Check blood sugar once daily and in the morning  before eating or drinking, document, and provide at future appointments o Contact provider with any episodes of hypoglycemia o Complete patient portion of Trulicity application  Asthma . Pharmacist Clinical Goal(s) o Over the next 120 days, patient will work with PharmD and providers to optimize therapy . Current regimen:  o Proventil HFA prn (via PAP) o Albuterol nebulized PRN . Interventions: o Recommend switch to Symbicort based on new asthma guidelines (GINA 2021) . Patient self care activities - Over the next 120 days, patient will: o Carry inhaler at all times  Depression . Pharmacist Clinical Goal(s) o Over the next 120 days, patient will work with PharmD and providers to optimize therapy . Current regimen:  o Duloxetine 60 mg - not picked up . Interventions: o Discussed benefits of duloxetine for depression and chronic pain . Patient self care activities - Over the next 120 days, patient will: o [facility]  Medication management . Pharmacist Clinical Goal(s): o Over the next 120 days, patient will work with PharmD and providers to maintain optimal medication adherence . [facility] . Interventions o Comprehensive medication review performed. o Continue current medication management strategy . Patient self care activities - Over the next 120 days, patient will: o Focus on medication adherence by pill box o Take medications as prescribed o Report any questions or concerns to PharmD and/or provider(s)  Initial goal documentation      Hypertension   BP goal is:  <130/80  Office blood pressures are  BP Readings from Last 3 Encounters:  08/01/20 120/70  07/02/20 140/78  07/02/20 120/76   Kidney Function Lab Results  Component Value Date/Time   CREATININE 0.91 05/27/2020 01:59 PM   CREATININE 0.83 03/27/2020 04:22 PM   GFR 73.48 05/27/2020 01:59 PM   GFRNONAA >60 08/29/2015 06:30 AM   GFRAA >60 08/29/2015 06:30 AM   K  3.5 05/27/2020 01:59 PM   K 4.0 03/27/2020 04:22 PM   Patient checks BP at home daily Patient home BP readings are ranging:   Patient has failed these meds in the past: n/a Patient is currently controlled on the following medications:  . Irbesartan 300 mg - 1/2 tablet daily (pt takes when BP > 160/100) . Chlorthalidone 25 mg daily . Carvedilol 25 mg BID   We discussed diet and exercise extensively; pt reports she typically takes irbesartan 1-3 days per week based on what her BP is running; she  says when she took 300 mg daily her BP dropped to 80s/40s. Discussed BP goal < 130/80; discussed benefits of ARB;   Plan  Continue current medications and control with diet and exercise    Hyperlipidemia   LDL goal < 100 Significant family history of MI, no personal hx of MI or stroke  Lipid Panel     Component Value Date/Time   CHOL 144 02/21/2020 1422   TRIG 272.0 (H) 02/21/2020 1422   HDL 47.30 02/21/2020 1422   LDLCALC 43 03/28/2014 1511   LDLDIRECT 57.0 02/21/2020 1422    Hepatic Function Latest Ref Rng & Units 02/21/2020 09/13/2019 01/04/2019  Total Protein 6.0 - 8.3 g/dL 7.7 7.9 7.8  Albumin 3.5 - 5.2 g/dL 4.2 4.5 4.3  AST 0 - 37 U/L _0 ALT 0 - 35 U/L _1 Alk Phosphatase 39 - 117 U/L 95 90 93  Total Bilirubin 0.2 - 1.2 mg/dL 0.6 0.5 0.5  Bilirubin, Direct 0.0 - 0.3 mg/dL 0.0 0.1 -    10-year ASCVD risk: <19.2% (calculated with LDL of 70, pt's LDL is actually 57)  Patient has failed these meds in past: n/a Patient is currently controlled on the following medications:  . Atorvastatin 20 mg daily HS . Aspirin 81 mg daily  We discussed:  diet and exercise extensively; benefits of statin for ASCVD risk reduction  Plan  Continue current medications  Diabetes   A1c goal <8%  Recent Relevant Labs: Lab Results  Component Value Date/Time   HGBA1C 7.4 (H) 05/27/2020 01:59 PM   HGBA1C 7.3 (H) 02/21/2020 02:22 PM   GFR 73.48 05/27/2020 01:59 PM   GFR 81.75  03/27/2020 04:22 PM   MICROALBUR 0.5 07/01/2020 11:43 AM   MICROALBUR 0.8 05/04/2019 12:12 PM    Last diabetic Eye exam:  Lab Results  Component Value Date/Time   HMDIABEYEEXA No Retinopathy 06/26/2019 12:00 AM    Last diabetic Foot exam:  Lab Results  Component Value Date/Time   HMDIABFOOTEX done 06/20/2018 12:00 AM    Checking BG: Daily  Recent FBG Readings: 150  Patient has failed these meds in past: n/a Patient is currently controlled on the following medications: . Trulicity 1.5 mg weekly Friday . Synjardy 12.5-500 mg BID (via PAP)  We discussed: BG goals; benefits of GLP-1 and SGLT-2 for heart/kidneys. Pt denies issues with medications. She does report Trulicity is over $832, pt will qualify for PAP.  Plan  Continue current medications and control with diet and exercise  Pursue Trulicity PAP via [employer]  GERD   Patient has failed these meds in past: omeprazole Patient is currently controlled on the following medications:  . Pantoprazole 40 mg BID . Famotidine 40 mg HS  We discussed:  Pt is following with GI for dysphagia. She reports no improvement since switching omeprazole to pantoprazole BID. She is awaiting EGD, but GI wants records from previous EGD before scheduling. Pt is in process of requesting records.  Plan  Continue current medications  F/U with GI as scheduled  Asthma   Last spirometry score: n/a  Lab Results  Component Value Date/Time   EOSPCT 3.3 07/01/2020 11:43 AM   EOSABS 343 07/01/2020 11:43 AM   Patient has failed these meds in past: [place] Patient is currently controlled on the following medications:  . Proventil HFA prn (via PAP) . Albuterol nebulized PRN  Using maintenance inhaler regularly? No maintenance inhaler Frequency of rescue inhaler use:  1-2x per week  We discussed:  proper inhaler technique; heat triggers flares; pt has been on ICS/LABA before; new asthma guidelines recommend Symbicort as first line  therapy (SABA alone no longer recommended), pt is ok with switching as long as it is covered. Per her formulary Symbicort is $45/month (likely more during coverage gap), so we can pursue PAP for this as well  Plan  Recommend switching Proventil to Symbicort based on 2021 GINA guidelines Pursue PAP for Symbicort via [employer]  Overactive bladder   Patient has failed these meds in past: Myrbetriq Patient is currently controlled on the following medications:  . No medications  We discussed: Pt could not afford Myrbetriq, she denies OAB issues currently.  Plan  Continue to monitor  Depression   Depression screen [facility] 2/9 05/27/2020 03/18/2020 03/08/2020  Decreased Interest _0 Down, Depressed, Hopeless _1 PHQ - 2 Score _2 Altered sleeping _3 Tired, decreased energy _4 Change in appetite _5 Feeling bad or failure about yourself  0 3 3  Trouble concentrating _6 Moving slowly or fidgety/restless 1 0 0  Suicidal thoughts 0 0 0  PHQ-9 Score _7 Difficult doing work/chores Very difficult Very difficult Extremely dIfficult  Some recent data might be hidden   Patient has failed these meds in past: alprazolam, fluoxetine, hydroxyzine, sertraline, Viibryd Patient is currently controlled on the following medications:  . Duloxetine 60 mg daily  We discussed:  Suffered severe depression in the past, hospitalized a few times. She is not sure if she wants to take a medication for depression long term. Discussed other benefits of duloxetine for pain management as well  Plan  Continue current medications  Pain   Degenerative arthritis of R Knee Spinal stenosis of lumbar region  Patient has failed these meds in past: n/a Patient is currently controlled on the following medications:  . Gabapentin 300 mg TID . Oxycodone-APAP 5-325 mg q8h PRN . Pennsaid solution PRN  We discussed:  Pt reports pain is relatively well controlled with this  regimen  Plan  Continue current medications  Glaucoma   Patient has failed these meds in past: n/a Patient is currently controlled on the following medications:  . Latanoprost 0.005% eye drops HS  We discussed:  Patient is satisfied with current regimen and denies issues  Plan  Continue current medications  Anemia   CBC Latest Ref Rng & Units 07/01/2020 05/27/2020 03/27/2020  WBC 3.8 - 10.8 Thousand/uL 10.4 11.7(H) 9.2  Hemoglobin 11.7 - 15.5 g/dL 11.8 11.3(L) 11.0(L)  Hematocrit 35 - 45 % 35.3 34.4(L) 33.0(L)  Platelets 140 - 400 Thousand/uL 359 368.0 303.0   Iron/TIBC/Ferritin/ %Sat    Component Value Date/Time   IRON 81 07/01/2020 1146   TIBC 356 07/01/2020 1146   FERRITIN 99 07/01/2020 1143   IRONPCTSAT 23 07/01/2020 1146   Patient has failed these meds in past: n/a Patient is currently controlled on the following medications:  . Ferrous sulfate 325 mg daily  We discussed: Pt was told she is no longer anemic, and wanted to know if she should still take iron supplement. Discussed benefits of iron supp to maintain blood counts  Plan  Continue current medications  Health Maintenance   Lab Results  Component Value Date/Time   VD25OH 36 07/01/2020 11:43 AM   VD25OH 66.29 02/21/2020 02:22 PM   VD25OH 82.13 09/13/2019 04:58 PM   Patient is currently controlled  on the following medications:  . Potassium chloride 20 mEq BID . Vitamin D 2000 IU daily . Fluocinonide 0.05% cream . Nystatin ointment  We discussed:  Discussed importance of daily Vitamin D to maintain levels  Plan  Continue current medications  Medication Management   [facility] [place] [facility] Uses pill box? Yes Pt endorses 100% compliance   We discussed: [facility], she is satisfied with their services.  Plan  Continue current medication management strategy    Follow up: 4 month phone visit  Charlene Brooke, PharmD, [facility] Clinical  Pharmacist [facility] at [facility] [phone]

## 2020-08-07 NOTE — Patient Instructions (Addendum)
Visit Information  Phone number for Pharmacist: [phone]  Thank you for meeting with me to discuss your medications! I look forward to working with you to achieve your health care goals. Below is a summary of what we talked about during the visit:  Goals Addressed            This Visit's Progress   . Pharmacy Care Plan       CARE PLAN ENTRY (see longitudinal plan of care for additional care plan information)  Current Barriers:  . Chronic Disease Management support, education, and care coordination needs related to Hypertension, Hyperlipidemia, Diabetes, Asthma, and Depression   Hypertension BP Readings from Last 3 Encounters:  08/01/20 120/70  07/02/20 140/78  07/02/20 120/76 .  Pharmacist Clinical Goal(s): o Over the next 120 days, patient will work with PharmD and providers to maintain BP goal <130/80 . Current regimen:  o Irbesartan 300 mg - 1/2 tablet daily o Chlorthalidone 25 mg daily o Carvedilol 25 mg twice a day . Interventions: o Discussed BP goals and benefits of medications for prevention of heart attack / stroke . Patient self care activities - Over the next  days, patient will: o Check BP daily, document, and provide at future appointments o Ensure daily salt intake < 2300 mg/day  Hyperlipidemia Lab Results  Component Value Date/Time   LDLCALC 43 03/28/2014 03:11 PM   LDLDIRECT 57.0 02/21/2020 02:22 PM .  Pharmacist Clinical Goal(s): o Over the next 120 days, patient will work with PharmD and providers to maintain LDL goal < 100 . Current regimen:  o Atorvastatin 20 mg daily o Aspirin 81 mg daily . Interventions: o Discussed cholesterol goals and benefits of medications for prevention of heart attack / stroke . Patient self care activities - Over the next 120 days, patient will: o Continue medication as prescribed  Diabetes Lab Results  Component Value Date/Time   HGBA1C 7.4 (H) 05/27/2020 01:59 PM   HGBA1C 7.3 (H) 02/21/2020 02:22 PM  .  Pharmacist Clinical Goal(s): o Over the next 120 days, patient will work with PharmD and providers to achieve A1c goal <8% . Current regimen:  o Trulicity 1.5 mg weekly o Synjardy 12.5-500 mg twice a day (via PAP) . Interventions: o Discussed blood sugar goals and benefits of medications for prevention of diabetic complications o Pursue patient assistance for Trulicity via [employer] . Patient self care activities - Over the next 120 days, patient will: o Check blood sugar once daily and in the morning before eating or drinking, document, and provide at future appointments o Contact provider with any episodes of hypoglycemia o Complete patient portion of Trulicity application  Asthma . Pharmacist Clinical Goal(s) o Over the next 120 days, patient will work with PharmD and providers to optimize therapy . Current regimen:  o Proventil HFA prn (via PAP) o Albuterol nebulized PRN . Interventions: o Recommend switch to Symbicort based on new asthma guidelines (GINA 2021) . Patient self care activities - Over the next 120 days, patient will: o Carry inhaler at all times  Depression . Pharmacist Clinical Goal(s) o Over the next 120 days, patient will work with PharmD and providers to optimize therapy . Current regimen:  o Duloxetine 60 mg - not picked up . Interventions: o Discussed benefits of duloxetine for depression and chronic pain . Patient self care activities - Over the next 120 days, patient will: o Pick up duloxetine 60 mg at [facility]  Medication management . Pharmacist Clinical Goal(s): o Over  the next 120 days, patient will work with PharmD and providers to maintain optimal medication adherence . Current pharmacy: [facility] . Interventions o Comprehensive medication review performed. o Continue current medication management strategy . Patient self care activities - Over the next 120 days, patient will: o Focus on medication adherence by pill box o Take medications  as prescribed o Report any questions or concerns to PharmD and/or provider(s)  Initial goal documentation      Valerie [name] was given information about Chronic Care Management services today including:  1. CCM service includes personalized support from designated clinical staff supervised by her physician, including individualized plan of care and coordination with other care providers 2. 24/7 contact phone numbers for assistance for urgent and routine care needs. 3. Standard insurance, coinsurance, copays and deductibles apply for chronic care management only during months in which we provide at least 20 minutes of these services. Most insurances cover these services at 100%, however patients may be responsible for any copay, coinsurance and/or deductible if applicable. This service may help you avoid the need for more expensive face-to-face services. 4. Only one practitioner may furnish and bill the service in a calendar month. 5. The patient may stop CCM services at any time (effective at the end of the month) by phone call to the office staff.  Patient agreed to services and verbal consent obtained.   Patient verbalizes understanding of instructions provided today.  Telephone follow up appointment with pharmacy team member scheduled for: 4 months  Charlene Brooke, PharmD, BCACP Clinical Pharmacist [facility] Maintenance for Postmenopausal Women Menopause is a normal process in which your ability to get pregnant comes to an end. This process happens slowly over many months or years, usually between the ages of 69 and 50. Menopause is complete when you have missed your menstrual periods for 12 months. It is important to talk with your health care provider about some of the most common conditions that affect women after menopause (postmenopausal women). These include heart disease, cancer, and bone loss (osteoporosis). Adopting a healthy  lifestyle and getting preventive care can help to promote your health and wellness. The actions you take can also lower your chances of developing some of these common conditions. What should I know about menopause? During menopause, you may get a number of symptoms, such as:  Hot flashes. These can be moderate or severe.  Night sweats.  Decrease in sex drive.  Mood swings.  Headaches.  Tiredness.  Irritability.  Memory problems.  Insomnia. Choosing to treat or not to treat these symptoms is a decision that you make with your health care provider. Do I need hormone replacement therapy?  Hormone replacement therapy is effective in treating symptoms that are caused by menopause, such as hot flashes and night sweats.  Hormone replacement carries certain risks, especially as you become older. If you are thinking about using estrogen or estrogen with progestin, discuss the benefits and risks with your health care provider. What is my risk for heart disease and stroke? The risk of heart disease, heart attack, and stroke increases as you age. One of the causes may be a change in the body's hormones during menopause. This can affect how your body uses dietary fats, triglycerides, and cholesterol. Heart attack and stroke are medical emergencies. There are many things that you can do to help prevent heart disease and stroke. Watch your blood pressure  High blood pressure causes heart disease and  increases the risk of stroke. This is more likely to develop in people who have high blood pressure readings, are of African descent, or are overweight.  Have your blood pressure checked: ? Every 3-5 years if you are 33-54 years of age. ? Every year if you are 68 years old or older. Eat a healthy diet   Eat a diet that includes plenty of vegetables, fruits, low-fat dairy products, and lean protein.  Do not eat a lot of foods that are high in solid fats, added sugars, or sodium. Get regular  exercise Get regular exercise. This is one of the most important things you can do for your health. Most adults should:  Try to exercise for at least 150 minutes each week. The exercise should increase your heart rate and make you sweat (moderate-intensity exercise).  Try to do strengthening exercises at least twice each week. Do these in addition to the moderate-intensity exercise.  Spend less time sitting. Even light physical activity can be beneficial. Other tips  Work with your health care provider to achieve or maintain a healthy weight.  Do not use any products that contain nicotine or tobacco, such as cigarettes, e-cigarettes, and chewing tobacco. If you need help quitting, ask your health care provider.  Know your numbers. Ask your health care provider to check your cholesterol and your blood sugar (glucose). Continue to have your blood tested as directed by your health care provider. Do I need screening for cancer? Depending on your health history and family history, you may need to have cancer screening at different stages of your life. This may include screening for:  Breast cancer.  Cervical cancer.  Lung cancer.  Colorectal cancer. What is my risk for osteoporosis? After menopause, you may be at increased risk for osteoporosis. Osteoporosis is a condition in which bone destruction happens more quickly than new bone creation. To help prevent osteoporosis or the bone fractures that can happen because of osteoporosis, you may take the following actions:  If you are 40-12 years old, get at least 1,000 mg of calcium and at least 600 mg of vitamin D per day.  If you are older than age 19 but younger than age 52, get at least 1,200 mg of calcium and at least 600 mg of vitamin D per day.  If you are older than age 31, get at least 1,200 mg of calcium and at least 800 mg of vitamin D per day. Smoking and drinking excessive alcohol increase the risk of osteoporosis. Eat foods that  are rich in calcium and vitamin D, and do weight-bearing exercises several times each week as directed by your health care provider. How does menopause affect my mental health? Depression may occur at any age, but it is more common as you become older. Common symptoms of depression include:  Low or sad mood.  Changes in sleep patterns.  Changes in appetite or eating patterns.  Feeling an overall lack of motivation or enjoyment of activities that you previously enjoyed.  Frequent crying spells. Talk with your health care provider if you think that you are experiencing depression. General instructions See your health care provider for regular wellness exams and vaccines. This may include:  Scheduling regular health, dental, and eye exams.  Getting and maintaining your vaccines. These include: ? Influenza vaccine. Get this vaccine each year before the flu season begins. ? Pneumonia vaccine. ? Shingles vaccine. ? Tetanus, diphtheria, and pertussis (Tdap) booster vaccine. Your health care provider may also recommend  other immunizations. Tell your health care provider if you have ever been abused or do not feel safe at home. Summary  Menopause is a normal process in which your ability to get pregnant comes to an end.  This condition causes hot flashes, night sweats, decreased interest in sex, mood swings, headaches, or lack of sleep.  Treatment for this condition may include hormone replacement therapy.  Take actions to keep yourself healthy, including exercising regularly, eating a healthy diet, watching your weight, and checking your blood pressure and blood sugar levels.  Get screened for cancer and depression. Make sure that you are up to date with all your vaccines. This information is not intended to replace advice given to you by your health care provider. Make sure you discuss any questions you have with your health care provider. Document Revised: 11/23/2018 Document  Reviewed: 11/23/2018 [employer]vier Patient Education  2020 [employer].

## 2020-08-08 DIAGNOSIS — H25811 Combined forms of age-related cataract, right eye: Secondary | ICD-10-CM | POA: Diagnosis not present

## 2020-08-13 ENCOUNTER — Other Ambulatory Visit: Payer: Self-pay

## 2020-08-13 ENCOUNTER — Ambulatory Visit (INDEPENDENT_AMBULATORY_CARE_PROVIDER_SITE_OTHER): Payer: HMO

## 2020-08-13 DIAGNOSIS — Z23 Encounter for immunization: Secondary | ICD-10-CM | POA: Diagnosis not present

## 2020-08-14 ENCOUNTER — Ambulatory Visit: Payer: HMO | Admitting: Neurology

## 2020-08-14 ENCOUNTER — Encounter: Payer: Self-pay | Admitting: Neurology

## 2020-08-14 VITALS — BP 155/94 | HR 94 | Ht 66.0 in | Wt 247.0 lb

## 2020-08-14 DIAGNOSIS — Z6841 Body Mass Index (BMI) 40.0 and over, adult: Secondary | ICD-10-CM | POA: Diagnosis not present

## 2020-08-14 DIAGNOSIS — G4701 Insomnia due to medical condition: Secondary | ICD-10-CM

## 2020-08-14 DIAGNOSIS — G4733 Obstructive sleep apnea (adult) (pediatric): Secondary | ICD-10-CM

## 2020-08-14 DIAGNOSIS — G8929 Other chronic pain: Secondary | ICD-10-CM | POA: Diagnosis not present

## 2020-08-14 NOTE — Progress Notes (Signed)
[facility]  Provider:  Dr Tonetta [name] Referring Provider: Janith Lima, MD Primary Care Physician:  Valerie Lima, MD  Chief Complaint  Patient presents with  . Follow-up    pt alone, rm 11. presents for yearly follow up. states machine is working well and has no concerns. DME adapt ([employer])    Interval history from 08-14-2020: [name], a 74 year old AAF , recently widowed, reports that this year has been very hard for her. She lived with her handicapped son alone, but now her 74 year old granddaughter has joined.  Her husband died with ESRD, hemodialysis, gallstones, Sepsis, and many ,many hospitalizations. He died of ischemic bowel disease. He was a full code. 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 BMI 39.87 kg/m2. Epworth score is 4 points today and her download shows high compliance: 100%  By days and time of use. 10 hours on average, 13/9 cm water. AHI : 0.0/h. High air leaks.        07-10-2019, I am seeing [name] , a meanwhile  74 y.o. female with a history of sleep disordered breathing. She has been followed by Valerie Rubin, NP for the last 3 visits. She is main caretaker of her very sick husband, who has become ' mean' . He has PT at the home, and declined any home health-- which leaves her to be the caretaker. He was hospitalized 6 times over the last 12 month.   Valerie [name] has been followed by Valerie [name] since her last insurance changes. The patient's primary care physician is Valerie [name] at [facility].  Valerie [name] endorsed today the geriatric depression score at 7 out of 15 points and explained that her caretaker role has been very difficult.  Her fatigue is increased at 38/63 points, Epworth Sleepiness Scale however is endorsed at only 3 out of 24 points not indicating excessive daytime sleepiness.  She has been a exemplary compliant CPAP patient using the machine  100% of the last 30 days, no days under 4 hours, average daily use of time 9 hours and 8 minutes.  She is using BiPAP with an inspiratory pressure of 13 expiratory pressure of 9 and a residual AHI of 0.1/h which is an excellent resolution she does have some air leakage, but it does not seem to lead to erroneously calculated apneas.  Her median respiratory rate is 16 a minute.  Her minute ventilation on average 12.2.        11-02-2016 - reconsultation- Valerie [name] is a 74 y.o. female here as a referral from Valerie [name] / Valerie [name].  I have the pleasure of seeing [name] today, who was last seen 35 months ago. The patient brought her compliance data for her BiPAP machine with her and they're excellent she has used the machine 100% of the last 30 days on average 8 hours and 42 minutes, residual AHI is 0.3. The machine is set at 13/9 cm water with breeze function.   The patient is followed by Valerie [name].  She feels that her machine makes a significant difference, her Epworth sleepiness score is 3 points while  using BiPAP, she's not excessively daytime fatigue, she endorsed the geriatric depression score at 6/15 points, basically related to her caregiver burden.   Husband had cardiac complaints , suffered from post dialysis chest pain. 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 Emergency  stent placed, but now he is unable from the CAD stand point to undergo necessary knee replacement.  Her husband is dependent on hemodialysis, and she feels that it is difficult to take care of herself , when her husband and son need her so much. Her son is a 3 year old quadriparetic.        History:  09-14-2013 [name] is a 74 year old right-handed black female with a history of obesity, and obstructive sleep apnea on CPAP. The patient has report some excessive daytime drowsiness and some problems with memory and concentration. The patient has had no improvement of her memory, but she indicates  that she has become intolerant of the CPAP mask, and she has not been able to use her CPAP machine for about one month. The patient has recently had left foot surgery. MRI done recently showed some minimal small vessel disease. The patient reports that in March of 2014, she had sudden onset of left-sided sensory changes with numbness, and she is also had some discomfort in the same distribution. The patient went to the emergency room, and MRI evaluation of the brain did not show an acute stroke. The patient is not aspirin or Plavix. The patient has some burning sensations of the left side of the face at times.  The patient comes to this office for an evaluation. Sleep Consult note : Valerie [name] is a mean of 74 year old African American right-handed lady patient of Valerie [name] and Valerie [name],  Valerie [name] .  The patient has a past medical history of morbid obesity and sleep apnea for which Valerie. Jannifer [name] referred her.  She reported increasing fatigue and excessive daytime sleepiness .  Valerie. Jannifer [name] had evaluated her memory complaints and found small vessel disease to a mild extent on a CAT scan.  The patient was supposed to see Valerie. Jannifer [name] in February  2014. I have seen Valerie [name] in that MD December 2013, for a sleep consult proceeding the study. The following PSG revealed a very high AHI of 76.1 she owns a BiPAP that was set at 17/11 cm water for a sleep study also short severe oxygen desaturations at night. At worst is today points on a, so she is no longer excessively daytime sleepy but if her fatigue severity is and/or step 63 points, the maximum. The patient is titration showed that she had at the very best response to 13 cm water over 8 cm in a BiPAP function the patient had failed a nasal mask before , thus  preferred to the full face mask. At pulse oximetry done on BiPAP revealed hypoxemia, which was  not corrected. The patient's revisit was scheduled for December 2014 for a compliance visit .  The  patient reports that her husband complains of her ongoing snoring while on BiPAP. Mrs. Jia underwent BiPAP - titration on 11-15-13 upon referral of Valerie. Jannifer [name]. We suspected that the patient may suffer from obesity hypoventilation . She brought her previous used BiPAP to the sleep laboratory, which was found to be set at 17 /11 cm water at the time the patient on this machine it had been reset to 13/8 cm water after her sleep study in 2013.  Her current  BiPAP machine is non functional, and is too old to have downloadable capacity, and also has begun to make noises and is unreliable at night seemingly switching itself off. This makes the order for a new machine essential . The patient BMI was 44.4. Her  fatigue scale was 63 points,  the PRQR  inventory was not endorsed , the Epworth Sleepiness Scale was 2  points. Next conference measured 70.5 inches. The optimal pressure after this titration seemed to be 13/9 cm. There was still mild snoring altered on 12/8 cm water which lead to further increase in a small increment. The patient slept 89 minutes at this pressure and reached 51 minutes in rem sleep. Her oxygen nadir increased to 94%. The night however and it shortly at 3 hours 20 minutes in AM - the patient could not re-initiate  sleep throughout the night. Fragmentation of sleep had significantly improved during titration prior to that point. REM rebound was noted. We will order a new BiPAP machine for the patient;  to be set at 13 cm water over 9 cm and a new  fullface mask ( if she still desired to uses this model in medium-size). He did humidity as ordered. The backup rate is not needed, compliance will be defined as 4 hours or more of nightly use,  the BiPAP machine is to be brought to all sleep clinic appointments.  Valerie [name] is an 74 y.o. Female disabled patient , since 2004 no longer gainfully employed. 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 Her husband is on Dialysis, which dominates her life, too.     Review of Systems: Out of a complete 14 system review, the patient complains of only the following symptoms, and all other reviewed systems are negative.  Valerie [name] endorsed today the geriatric depression score at 7 out of 15 points and explained that her caretaker role has been very difficult.  Her fatigue is increased at 38/63 points, Epworth Sleepiness Scale however is endorsed at only 3 out of 24 points not indicating excessive daytime sleepiness.  Depression. Caregiver burden.   She also takes care of her husband and a handicapped son- who can't live by himself, he is quadriplegic with seizures. .   Social History   Socioeconomic History  . Marital status: Widowed    Spouse name: Not on file  . Number of children: 3  . Years of education: 11th  . Highest education level: 11th grade  Occupational History  . Occupation: disabled  Tobacco Use  . Smoking status: Never Smoker  . Smokeless tobacco: Never Used  Vaping Use  . Vaping Use: Never used  Substance and Sexual Activity  . Alcohol use: No    Alcohol/week: 0.0 standard drinks  . Drug use: No  . Sexual activity: Not Currently    Comment: not working, lives with husband, 3 sons  Other Topics Concern  . Not on file  Social History Narrative   Client's spouse recently passed Feb 2021    also caregiver for son who is 11 yo and is disabled.   Social Determinants of Health   Financial Resource Strain: High Risk  . Difficulty of Paying Living Expenses: Hard  Food Insecurity: No Food Insecurity  . Worried About Charity fundraiser in the Last Year: Never true  . Ran Out of Food in the Last Year: Never true  Transportation Needs: No Transportation Needs  . Lack of Transportation (Medical): No  . Lack of Transportation (Non-Medical): No  Physical Activity: Inactive  . Days of Exercise per Week: 0 days  . Minutes of Exercise per Session: 0 min  Stress: Stress Concern Present  . Feeling of Stress : Rather much  Social  Connections: Moderately Integrated  . Frequency of Communication with  Friends and Family: More than three times a week  . Frequency of Social Gatherings with Friends and Family: More than three times a week  . Attends Religious Services: More than 4 times per year  . Active Member of Clubs or Organizations: Yes  . Attends Archivist Meetings: More than 4 times per year  . Marital Status: Widowed  Intimate Partner Violence: Not At Risk  . Fear of Current or Ex-Partner: No  . Emotionally Abused: No  . Physically Abused: No  . Sexually Abused: No    Family History  Problem Relation Age of Onset  . Alcohol abuse Mother   . Heart attack Mother   . Coronary artery disease Brother   . Heart attack Brother   . Hypertension Brother   . Hyperlipidemia Brother   . Heart attack Father   . Hypertension Father   . Hyperlipidemia Father   . Heart disease Sister   . Atrial fibrillation Sister   . Hypertension Sister   . Hyperlipidemia Sister   . Hypertension Other        family history  . Alcohol abuse Other   . Colon cancer Brother 78  . Hypertension Brother   . Hyperlipidemia Brother   . Dementia Brother   . Hypertension Brother   . Hyperlipidemia Brother   . Diabetes Son   . Hypertension Son   . Hyperlipidemia Son   . Diabetes Son   . Hypertension Son   . Hyperlipidemia Son   . Cerebral palsy Son   . Hypertension Son   . Hyperlipidemia Son     Past Medical History:  Diagnosis Date  . Anemia   . Anxiety and depression   . Arthritis   . Asthma   . Cataract   . Degenerative arthritis   . Depression   . Diabetes mellitus, type 2 ([place])   . Fatigue   . GERD (gastroesophageal reflux disease)   . Glaucoma   . Heart palpitations   . Hemorrhoids   . Hyperlipidemia   . Hypertension   . Hypothyroidism   . Hypoxemia 11/24/2013  . Memory deficit 10/04/2013  . Obesity   . Sleep apnea    BiPAP  . Snoring disorder     Past Surgical History:  Procedure  Laterality Date  . benign tumors resected    . CATARACT EXTRACTION Left   . FOOT SURGERY Left    bone spur  . REFRACTIVE SURGERY     Glaucoma  . ROTATOR CUFF REPAIR Left   . TUBAL LIGATION      Current Outpatient Medications  Medication Sig Dispense Refill  . chlorthalidone (HYGROTON) 25 MG tablet TAKE 1 TABLET BY MOUTH EVERY DAY 90 tablet 0  . Cholecalciferol 50 MCG (2000 UT) TABS Take 1 tablet (2,000 Units total) by mouth daily. 90 tablet 1  . albuterol (PROVENTIL) (2.5 MG/3ML) 0.083% nebulizer solution     . albuterol (VENTOLIN HFA) 108 (90 Base) MCG/ACT inhaler Inhale 2 puffs into the lungs every 6 (six) hours as needed. For shortness of breath 3 Inhaler 4  . aspirin 81 MG tablet Take 81 mg by mouth daily.     [name] atorvastatin (LIPITOR) 20 MG tablet TAKE 1 TABLET BY MOUTH EVERY DAY 90 tablet 1  . carvedilol (COREG) 25 MG tablet TAKE 1 TABLET BY MOUTH TWICE DAILY WITH MEALS 180 tablet 0  . Dulaglutide (TRULICITY) 1.5 TZ/0.0FV SOPN Inject 0.5 mLs (1.5 mg total) into the skin once a week. 4 pen  0  . DULoxetine (CYMBALTA) 60 MG capsule Take 1 capsule (60 mg total) by mouth daily. (Patient not taking: Reported on 08/06/2020) 90 capsule 1  . Empagliflozin-metFORMIN HCl (SYNJARDY) 12.5-500 MG TABS Take 1 tablet by mouth 2 (two) times daily. 180 tablet 3  . famotidine (PEPCID) 40 MG tablet Take 1 tablet (40 mg total) by mouth at bedtime. 90 tablet 1  . ferrous sulfate 325 (65 FE) MG tablet TAKE 1 TABLET BY MOUTH TWICE DAILY WITH A MEAL 180 tablet 1  . Fluocinonide Emulsified Base 0.05 % CREA APPLY  CREAM EXTERNALLY TO AFFECTED AREA TWICE DAILY 60 g 2  . gabapentin (NEURONTIN) 300 MG capsule TAKE 1 CAPSULE BY MOUTH THREE TIMES DAILY 270 capsule 0  . glucose blood (FREESTYLE TEST STRIPS) test strip USE TO TEST BLOOD SUGAR 3 TIMES A DAY. DX: E11.8 100 strip 11  . Insulin Pen Needle 31G X 5 MM MISC Use once daily with insulin 100 each 3  . irbesartan (AVAPRO) 300 MG tablet Take 0.5 tablets (150 mg  total) by mouth daily. 90 tablet 1  . latanoprost (XALATAN) 0.005 % ophthalmic solution Place 1 drop into both eyes at bedtime.     [name] MYRBETRIQ 25 MG TB24 tablet TAKE 1 TABLET BY MOUTH EVERY DAY 90 tablet 0  . nystatin ointment (MYCOSTATIN) APPLY ON THE SKIN THREE TIMES DAILY    . oxyCODONE-acetaminophen (PERCOCET/ROXICET) 5-325 MG tablet Take 1 tablet by mouth every 8 (eight) hours as needed for severe pain. 90 tablet 0  . pantoprazole (PROTONIX) 40 MG tablet Take 1 tablet (40 mg total) by mouth 2 (two) times daily before a meal. 60 tablet 3  . potassium chloride SA (KLOR-CON M20) 20 MEQ tablet TAKE 1 TABLET BY MOUTH TWICE A DAY 180 tablet 1   No current facility-administered medications for this visit.    Allergies as of 08/14/2020 - Review Complete 08/14/2020  Allergen Reaction Noted  . Food Swelling 05/04/2012  . Penicillins Swelling and Rash     Vitals: BP (!) 155/94   Pulse 94   Ht 5\' 6"  (1.676 m)   Wt 247 lb (112 kg)   BMI 39.87 kg/m  Last Weight:  Wt Readings from Last 1 Encounters:  08/14/20 247 lb (112 kg)   Last Height:   Ht Readings from Last 1 Encounters:  08/14/20 5\' 6"  (1.676 m)     Physical exam:  General: The patient is awake, alert and appears not in acute distress. The patient is well groomed. Head: Normocephalic, atraumatic. Neck is supple. Mallampati 3, neck circumference:17,5. Cardiovascular:  Regular rate and rhythm, without  murmurs or carotid bruit, and without distended neck veins. Respiratory: Lungs are clear to auscultation. Skin:  Without evidence of edema, or rash Trunk: BMI  Elevated -, morbidly obese. Mental Status: Alert, oriented, thought content appropriate.  Speech fluent without evidence of aphasia. Able to follow 3 step commands without difficulty. Cranial Nerves:  Right eye cataract, Glaucoma in the left. Left eye status post cataract.  Preserved taste and smell sense, equal pupils, facial symmetry and  facial sensory ,  full visual  field for the left, she is in the process of being further worked up by [employer]	[place] and ophthalmology.    Past Medical History:  Diagnosis Date  . Anemia   . Anxiety and depression   . Arthritis   . Asthma   . Cataract   . Degenerative arthritis   . Depression   . Diabetes mellitus, type 2 ([place])   .  Fatigue   . GERD (gastroesophageal reflux disease)   . Glaucoma   . Heart palpitations   . Hemorrhoids   . Hyperlipidemia   . Hypertension   . Hypothyroidism   . Hypoxemia 11/24/2013  . Memory deficit 10/04/2013  . Obesity   . Sleep apnea    BiPAP  . Snoring disorder     Past Surgical History:  Procedure Laterality Date  . benign tumors resected    . CATARACT EXTRACTION Left   . FOOT SURGERY Left    bone spur  . REFRACTIVE SURGERY     Glaucoma  . ROTATOR CUFF REPAIR Left   . TUBAL LIGATION      Family History  Problem Relation Age of Onset  . Alcohol abuse Mother   . Heart attack Mother   . Coronary artery disease Brother   . Heart attack Brother   . Hypertension Brother   . Hyperlipidemia Brother   . Heart attack Father   . Hypertension Father   . Hyperlipidemia Father   . Heart disease Sister   . Atrial fibrillation Sister   . Hypertension Sister   . Hyperlipidemia Sister   . Hypertension Other        family history  . Alcohol abuse Other   . Colon cancer Brother 75  . Hypertension Brother   . Hyperlipidemia Brother   . Dementia Brother   . Hypertension Brother   . Hyperlipidemia Brother   . Diabetes Son   . Hypertension Son   . Hyperlipidemia Son   . Diabetes Son   . Hypertension Son   . Hyperlipidemia Son   . Cerebral palsy Son   . Hypertension Son   . Hyperlipidemia Son     Social history:  reports that she has never smoked. She has never used smokeless tobacco. She reports that she does not drink alcohol and does not use drugs.    Allergies  Allergen Reactions  . Food Swelling    bananas  . Penicillins Swelling and Rash     Medications:  Current Outpatient Medications on File Prior to Visit  Medication Sig Dispense Refill  . chlorthalidone (HYGROTON) 25 MG tablet TAKE 1 TABLET BY MOUTH EVERY DAY 90 tablet 0  . Cholecalciferol 50 MCG (2000 UT) TABS Take 1 tablet (2,000 Units total) by mouth daily. 90 tablet 1  . albuterol (PROVENTIL) (2.5 MG/3ML) 0.083% nebulizer solution     . albuterol (VENTOLIN HFA) 108 (90 Base) MCG/ACT inhaler Inhale 2 puffs into the lungs every 6 (six) hours as needed. For shortness of breath 3 Inhaler 4  . aspirin 81 MG tablet Take 81 mg by mouth daily.     [name] atorvastatin (LIPITOR) 20 MG tablet TAKE 1 TABLET BY MOUTH EVERY DAY 90 tablet 1  . carvedilol (COREG) 25 MG tablet TAKE 1 TABLET BY MOUTH TWICE DAILY WITH MEALS 180 tablet 0  . Dulaglutide (TRULICITY) 1.5 SW/9.6PR SOPN Inject 0.5 mLs (1.5 mg total) into the skin once a week. 4 pen 0  . DULoxetine (CYMBALTA) 60 MG capsule Take 1 capsule (60 mg total) by mouth daily. (Patient not taking: Reported on 08/06/2020) 90 capsule 1  . Empagliflozin-metFORMIN HCl (SYNJARDY) 12.5-500 MG TABS Take 1 tablet by mouth 2 (two) times daily. 180 tablet 3  . famotidine (PEPCID) 40 MG tablet Take 1 tablet (40 mg total) by mouth at bedtime. 90 tablet 1  . ferrous sulfate 325 (65 FE) MG tablet TAKE 1 TABLET BY MOUTH TWICE DAILY WITH A  MEAL 180 tablet 1  . Fluocinonide Emulsified Base 0.05 % CREA APPLY  CREAM EXTERNALLY TO AFFECTED AREA TWICE DAILY 60 g 2  . gabapentin (NEURONTIN) 300 MG capsule TAKE 1 CAPSULE BY MOUTH THREE TIMES DAILY 270 capsule 0  . glucose blood (FREESTYLE TEST STRIPS) test strip USE TO TEST BLOOD SUGAR 3 TIMES A DAY. DX: E11.8 100 strip 11  . Insulin Pen Needle 31G X 5 MM MISC Use once daily with insulin 100 each 3  . irbesartan (AVAPRO) 300 MG tablet Take 0.5 tablets (150 mg total) by mouth daily. 90 tablet 1  . latanoprost (XALATAN) 0.005 % ophthalmic solution Place 1 drop into both eyes at bedtime.     [name] MYRBETRIQ 25 MG TB24  tablet TAKE 1 TABLET BY MOUTH EVERY DAY 90 tablet 0  . nystatin ointment (MYCOSTATIN) APPLY ON THE SKIN THREE TIMES DAILY    . oxyCODONE-acetaminophen (PERCOCET/ROXICET) 5-325 MG tablet Take 1 tablet by mouth every 8 (eight) hours as needed for severe pain. 90 tablet 0  . pantoprazole (PROTONIX) 40 MG tablet Take 1 tablet (40 mg total) by mouth 2 (two) times daily before a meal. 60 tablet 3  . potassium chloride SA (KLOR-CON M20) 20 MEQ tablet TAKE 1 TABLET BY MOUTH TWICE A DAY 180 tablet 1  . [DISCONTINUED] atorvastatin (LIPITOR) 20 MG tablet TAKE 1 TABLET BY MOUTH EVERY DAY 90 tablet 1  . [DISCONTINUED] chlorthalidone (HYGROTON) 25 MG tablet Take 1 tablet (25 mg total) by mouth daily. 90 tablet 0  . [DISCONTINUED] chlorthalidone (HYGROTON) 25 MG tablet TAKE 1 TABLET BY MOUTH EVERY DAY 90 tablet 0  . [DISCONTINUED] glucose blood (FREESTYLE TEST STRIPS) test strip Use to test blood sugar 3 times a day. DX: E11.8 100 each 11   No current facility-administered medications on file prior to visit.   Valerie [name] is a compliant user of her BiPAP machine, she had a treatment emergent central apnea diagnosis, her baseline study had shown obstructive sleep apnea mainly.  She can only tolerate BiPAP therapy and likes the current settings. She's 100% compliant I will see her from now on once a year. Patient used a new [employer] full face mask in medium size when she was titrated- , now she is using a AIRFIT FFM 20 medium, with memory foam.    Will send order for new supplies now from [facility], DME.  The BiPAP machine continues at 13 over 9 cm water , and will follow with NP once a year.   Primary Neurologist is Valerie Floyde Parkins, MD , who follows for memory disorder.   [facility] [address] [place], [place] [address]  Phone [phone] Fax [phone]

## 2020-08-14 NOTE — Patient Instructions (Signed)
Obesity Hypoventilation Syndrome  Obesity hypoventilation syndrome (OHS) means that you are not breathing well enough to get air in and out of your lungs efficiently (ventilation). This causes a low oxygen level and a high carbon dioxide level in your blood (hypoventilation). Having too much total body fat (obesity) is a significant risk factor for developing OHS. OHS makes it harder for your heart to pump oxygen-rich blood to your body. It can cause sleep disturbances and make you feel sleepy during the day. Over time, OHS can increase your risk for:  Heart disease.  High blood pressure (hypertension).  Reduced ability to absorb sugar from the bloodstream (insulin resistance).  Heart failure. Over time, OHS weakens your heart and can lead to heart failure. What are the causes? The exact cause of OHS is not known. Possible causes include:  Pressure on the lungs from excess body weight.  Obesity-related changes in how much air the lungs can hold (lung capacity) and how much they can expand (lung compliance).  Failure of the brain to regulate oxygen and carbon dioxide levels properly.  Chemicals (hormones) produced by excess fat cells interfering with breathing regulation.  A breathing condition in which breathing pauses or becomes shallow during sleep (sleep apnea). This condition can eventually cause the body to ventilate poorly and to hold onto carbon dioxide during the day. What increases the risk? You may have a greater risk for OHS if you:  Have a BMI of 30 or higher. BMI is an estimate of body fat that is calculated from height and weight. For adults, a BMI of 30 or higher is considered obese.  Are 40?72 years old.  Carry most of your excess weight around your waist.  Experience moderate symptoms of sleep apnea. What are the signs or symptoms? The most common symptoms of OHS are:  Daytime sleepiness.  Lack of energy.  Shortness of breath.  Morning headaches.  Sleep  apnea.  Trouble concentrating.  Irritability, mood swings, or depression.  Swollen veins in the neck.  Swelling of the legs. How is this diagnosed? Your health care provider may suspect OHS if you are obese and have poor breathing during the day and at night. Your health care provider will also do a physical exam. You may have tests to:  Measure your BMI.  Measure your blood oxygen level with a sensor placed on your finger (pulse oximetry).  Measure blood oxygen and carbon dioxide in a blood sample.  Measure the amount of red blood cells in a blood sample. OHS causes the number of red blood cells you have to increase (polycythemia).  Check your breathing ability (pulmonary function testing).  Check your breathing ability, breathing patterns, and oxygen level while you sleep (sleep study). You may also have a chest X-ray to rule out other breathing problems. You may have an electrocardiogram (ECG) and or echocardiogram to check for signs of heart failure. How is this treated? Weight loss is the most important part of treatment for OHS, and it may be the only treatment that you need. Other treatments may include:  Using a device to open your airway while you sleep, such as a continuous positive airway pressure (CPAP) machine that delivers oxygen to your airway through a mask.  Surgery (gastric bypass surgery) to lower your BMI. This may be needed if: ? You are very obese. ? Other treatments have not worked for you. ? Your OHS is very severe and is causing organ damage, such as heart failure. Follow these   instructions at home:  Medicines  Take over-the-counter and prescription medicines only as told by your health care provider.  Ask your health care provider what medicines are safe for you. You may be told to avoid medicines that can impair breathing and make OHS worse, such as sedatives and narcotics. Sleeping habits  If you are prescribed a CPAP machine, make sure you  understand and use the machine as directed.  Try to get 8 hours of sleep every night.  Go to bed at the same time every night, and get up at the same time every day. General instructions  Work with your health care provider to make a diet and exercise plan that helps you reach and maintain a healthy weight.  Eat a healthy diet.  Avoid smoking.  Exercise regularly as told by your health care provider.  During the evening, do not drink caffeine and do not eat heavy meals.  Keep all follow-up visits as told by your health care provider. This is important. Contact a health care provider if:  You experience new or worsening shortness of breath.  You have chest pain.  You have an irregular heartbeat (palpitations).  You have dizziness.  You faint.  You develop a cough.  You have a fever.  You have chest pain when you breathe (pleurisy). This information is not intended to replace advice given to you by your health care provider. Make sure you discuss any questions you have with your health care provider. Document Revised: 03/24/2019 Document Reviewed: 05/11/2016 Elsevier Patient Education  2020 Elsevier Inc.  

## 2020-08-19 ENCOUNTER — Other Ambulatory Visit: Payer: Self-pay | Admitting: Internal Medicine

## 2020-08-19 DIAGNOSIS — I10 Essential (primary) hypertension: Secondary | ICD-10-CM

## 2020-08-19 MED ORDER — CARVEDILOL 25 MG PO TABS
25.0000 mg | ORAL_TABLET | Freq: Two times a day (BID) | ORAL | 0 refills | Status: DC
Start: 2020-08-19 — End: 2020-11-14

## 2020-08-23 ENCOUNTER — Encounter: Payer: HMO | Admitting: Gastroenterology

## 2020-08-27 ENCOUNTER — Ambulatory Visit: Payer: HMO | Admitting: Internal Medicine

## 2020-08-29 ENCOUNTER — Other Ambulatory Visit: Payer: Self-pay

## 2020-08-29 NOTE — Patient Outreach (Signed)
  [facility]) Care Management Chronic Special Needs Program    08/29/2020  Name: [name], DOB: 23-Aug-1946  MRN: [id number]   Ms. Belladonna Lubinski is enrolled in a chronic special needs plan for Diabetes. RNCM called to follow up and review individualized care plan. No answer. HIPAA compliant message left.   Plan: Chronic care management coordinator will attempt outreach within 1-2 weeks.  [name], RN, MSN, [place] [phone]

## 2020-08-30 ENCOUNTER — Ambulatory Visit: Payer: Self-pay

## 2020-09-02 ENCOUNTER — Other Ambulatory Visit: Payer: Self-pay

## 2020-09-02 NOTE — Patient Outreach (Signed)
  [facility]) Care Management Chronic Special Needs Program    09/02/2020  Name: Valerie [name], DOB: [birth year]  MRN: [id number]   Valerie [name] is enrolled in a chronic special needs plan for Diabetes. RNCM called to follow up and review individualized care plan. No answer. HIPAA compliant message left. 2nd outreach attempt.  Plan: Chronic care management coordinator will attempt outreach within 1-2 weeks.  [name], RN, MSN, [place] [phone]

## 2020-09-03 ENCOUNTER — Ambulatory Visit: Payer: Self-pay

## 2020-09-03 ENCOUNTER — Ambulatory Visit: Payer: HMO | Admitting: Family Medicine

## 2020-09-03 NOTE — Progress Notes (Deleted)
Valerie [name] Phone: [phone] Subjective:    I'm seeing this patient by the request  of:  Janith Lima, MD  CC:   [id number]   07/02/2020 Arthritic changes mostly of the medial patellofemoral compartment.  Patient has no instability patient continues to be very active and encouraged her to still try to be.  Worsening pain will consider injection.  X-rays pending.  Topical anti-inflammatories given  Update 09/03/2020 Valerie [name] is a 74 y.o. female coming in with complaint of right knee and back pain. Patient states   Xray right knee 07/02/2020 IMPRESSION: Degenerative changes RIGHT knee.  No acute osseous abnormalities.    Past Medical History:  Diagnosis Date  . Anemia   . Anxiety and depression   . Arthritis   . Asthma   . Cataract   . Degenerative arthritis   . Depression   . Diabetes mellitus, type 2 ([place])   . Fatigue   . GERD (gastroesophageal reflux disease)   . Glaucoma   . Heart palpitations   . Hemorrhoids   . Hyperlipidemia   . Hypertension   . Hypothyroidism   . Hypoxemia 11/24/2013  . Memory deficit 10/04/2013  . Obesity   . Sleep apnea    BiPAP  . Snoring disorder    Past Surgical History:  Procedure Laterality Date  . benign tumors resected    . CATARACT EXTRACTION Left   . FOOT SURGERY Left    bone spur  . REFRACTIVE SURGERY     Glaucoma  . ROTATOR CUFF REPAIR Left   . TUBAL LIGATION     Social History   Socioeconomic History  . Marital status: Widowed    Spouse name: Not on file  . Number of children: 3  . Years of education: 11th  . Highest education level: 11th grade  Occupational History  . Occupation: disabled  Tobacco Use  . Smoking status: Never Smoker  . Smokeless tobacco: Never Used  Vaping Use  . Vaping Use: Never used  Substance and Sexual Activity  . Alcohol use: No    Alcohol/week: 0.0 standard drinks  . Drug use: No  . Sexual  activity: Not Currently    Comment: not working, lives with husband, 3 sons  Other Topics Concern  . Not on file  Social History Narrative   Client's spouse recently passed Feb 2021    also caregiver for son who is 62 yo and is disabled.   Social Determinants of Health   Financial Resource Strain: High Risk  . Difficulty of Paying Living Expenses: Hard  Food Insecurity: No Food Insecurity  . Worried About Charity fundraiser in the Last Year: Never true  . Ran Out of Food in the Last Year: Never true  Transportation Needs: No Transportation Needs  . Lack of Transportation (Medical): No  . Lack of Transportation (Non-Medical): No  Physical Activity: Inactive  . Days of Exercise per Week: 0 days  . Minutes of Exercise per Session: 0 min  Stress: Stress Concern Present  . Feeling of Stress : Rather much  Social Connections: Moderately Integrated  . Frequency of Communication with Friends and Family: More than three times a week  . Frequency of Social Gatherings with Friends and Family: More than three times a week  . Attends Religious Services: More than 4 times per year  . Active Member of Clubs or Organizations: Yes  . Attends Archivist  Meetings: More than 4 times per year  . Marital Status: Widowed   Allergies  Allergen Reactions  . Food Swelling    bananas  . Penicillins Swelling and Rash   Family History  Problem Relation Age of Onset  . Alcohol abuse Mother   . Heart attack Mother   . Coronary artery disease Brother   . Heart attack Brother   . Hypertension Brother   . Hyperlipidemia Brother   . Heart attack Father   . Hypertension Father   . Hyperlipidemia Father   . Heart disease Sister   . Atrial fibrillation Sister   . Hypertension Sister   . Hyperlipidemia Sister   . Hypertension Other        family history  . Alcohol abuse Other   . Colon cancer Brother 44  . Hypertension Brother   . Hyperlipidemia Brother   . Dementia Brother   .  Hypertension Brother   . Hyperlipidemia Brother   . Diabetes Son   . Hypertension Son   . Hyperlipidemia Son   . Diabetes Son   . Hypertension Son   . Hyperlipidemia Son   . Cerebral palsy Son   . Hypertension Son   . Hyperlipidemia Son     Current Outpatient Medications (Endocrine & Metabolic):  [name]  Dulaglutide (TRULICITY) 1.5 GH/8.2XH SOPN, Inject 0.5 mLs (1.5 mg total) into the skin once a week. .  Empagliflozin-metFORMIN HCl (SYNJARDY) 12.5-500 MG TABS, Take 1 tablet by mouth 2 (two) times daily.  Current Outpatient Medications (Cardiovascular):  .  atorvastatin (LIPITOR) 20 MG tablet, TAKE 1 TABLET BY MOUTH EVERY DAY .  carvedilol (COREG) 25 MG tablet, Take 1 tablet (25 mg total) by mouth 2 (two) times daily with a meal. .  chlorthalidone (HYGROTON) 25 MG tablet, TAKE 1 TABLET BY MOUTH EVERY DAY .  irbesartan (AVAPRO) 300 MG tablet, Take 0.5 tablets (150 mg total) by mouth daily.  Current Outpatient Medications (Respiratory):  .  albuterol (PROVENTIL) (2.5 MG/3ML) 0.083% nebulizer solution,  .  albuterol (VENTOLIN HFA) 108 (90 Base) MCG/ACT inhaler, Inhale 2 puffs into the lungs every 6 (six) hours as needed. For shortness of breath  Current Outpatient Medications (Analgesics):  .  aspirin 81 MG tablet, Take 81 mg by mouth daily.  [name]  oxyCODONE-acetaminophen (PERCOCET/ROXICET) 5-325 MG tablet, Take 1 tablet by mouth every 8 (eight) hours as needed for severe pain.  Current Outpatient Medications (Hematological):  .  ferrous sulfate 325 (65 FE) MG tablet, TAKE 1 TABLET BY MOUTH TWICE DAILY WITH A MEAL  Current Outpatient Medications (Other):  [name]  Cholecalciferol 50 MCG (2000 UT) TABS, Take 1 tablet (2,000 Units total) by mouth daily. .  DULoxetine (CYMBALTA) 60 MG capsule, Take 1 capsule (60 mg total) by mouth daily. (Patient not taking: Reported on 08/06/2020) .  famotidine (PEPCID) 40 MG tablet, Take 1 tablet (40 mg total) by mouth at bedtime. .  Fluocinonide Emulsified Base  0.05 % CREA, APPLY  CREAM EXTERNALLY TO AFFECTED AREA TWICE DAILY .  gabapentin (NEURONTIN) 300 MG capsule, TAKE 1 CAPSULE BY MOUTH THREE TIMES DAILY .  glucose blood (FREESTYLE TEST STRIPS) test strip, USE TO TEST BLOOD SUGAR 3 TIMES A DAY. DX: E11.8 .  Insulin Pen Needle 31G X 5 MM MISC, Use once daily with insulin .  latanoprost (XALATAN) 0.005 % ophthalmic solution, Place 1 drop into both eyes at bedtime.  [name]  MYRBETRIQ 25 MG TB24 tablet, TAKE 1 TABLET BY MOUTH EVERY DAY .  nystatin ointment (MYCOSTATIN), APPLY ON THE SKIN THREE TIMES DAILY .  pantoprazole (PROTONIX) 40 MG tablet, Take 1 tablet (40 mg total) by mouth 2 (two) times daily before a meal. .  potassium chloride SA (KLOR-CON M20) 20 MEQ tablet, TAKE 1 TABLET BY MOUTH TWICE A DAY   Reviewed prior external information including notes and imaging from  primary care provider As well as notes that were available from care everywhere and other healthcare systems.  Past medical history, social, surgical and family history all reviewed in electronic medical record.  No pertanent information unless stated regarding to the chief complaint.   Review of Systems:  No headache, visual changes, nausea, vomiting, diarrhea, constipation, dizziness, abdominal pain, skin rash, fevers, chills, night sweats, weight loss, swollen lymph nodes, body aches, joint swelling, chest pain, shortness of breath, mood changes. POSITIVE muscle aches  Objective  There were no vitals taken for this visit.   General: No apparent distress alert and oriented x3 mood and affect normal, dressed appropriately.  HEENT: Pupils equal, extraocular movements intact  Respiratory: Patient's speak in full sentences and does not appear short of breath  Cardiovascular: No lower extremity edema, non tender, no erythema  Neuro: Cranial nerves II through XII are intact, neurovascularly intact in all extremities with 2+ DTRs and 2+ pulses.  Gait normal with good balance and  coordination.  MSK:  Non tender with full range of motion and good stability and symmetric strength and tone of shoulders, elbows, wrist, hip, knee and ankles bilaterally.     Impression and Recommendations:     The above documentation has been reviewed and is accurate and complete Jacqualin Combes       Note: This dictation was prepared with Dragon dictation along with smaller phrase technology. Any transcriptional errors that result from this process are unintentional.

## 2020-09-04 ENCOUNTER — Ambulatory Visit: Payer: Self-pay

## 2020-09-04 ENCOUNTER — Other Ambulatory Visit: Payer: Self-pay

## 2020-09-04 NOTE — Patient Outreach (Signed)
[facility]) Care Management Chronic Special Needs Program  09/04/2020  Name: Valerie [name] DOB: 1946/03/15  MRN: [id number]  Valerie [name] is enrolled in a chronic special needs plan for Diabetes. RNCM called to follow up and review individualized care plan. No answer. HIPAA compliant message left. 3rd unsuccessful outreach. RNCM will update care plan based on available data.    Goals Addressed              This Visit's Progress   .   Acknowledge receipt of Advanced Directive package   On track     An advance directive packet was mailed to you March 2021. Please call if you need any additional assistance with advance directive packet.     .   Diabetes Patient stated goal-"Loose weight" (pt-stated)   On track     Continue: It is important to take time to care for yourself. Find ways to carve out time for yourself.  Continue previous goal to increase activity to approximately 20-30 minutes of activity per day/ 5 days per week.  The [employer] Health Coach has been in contact with you. Please follow up with [employer] Health Coach for assistance with this goal as needed. Please contact your RN Care Coordinator [phone] with questions.    .  Client will report improved coping within the next 6-9 months.   On track     Unable to discuss with client. Continue to take medications as prescribed by your doctor. Contact your doctor if you have any questions or concerns. Continue to attend provider visits as scheduled/recommended Continue goals for increased activity. Continue prayer life.  Carve out time for yourself Monitor for stressors in your life.  Emmi education provided: "stress". Please review and call if you have any questions or would like community resources.    .  Client will verbalize knowledge of self management of Hypertension as evidences by BP reading of 140/90 or less; or as defined by provider   Improving     Blood  pressure: 08/14/2020 155/94; 08/01/2020 120/70; 07/01/20 140/80; 03/27/20 158/88; 05/31/20 130/78; 02/21/20 172/96    Continue to follow up with your doctor as scheduled. Take your medications as prescribed by your doctor. Ask your doctor "what is my target blood pressure range". Monitor your blood pressure and take results to your doctor's appointment.  Monitor the amount of salt you are eating. Continue to exercise as tolerated and remain active. Eat heart healthy diet (full of fruits, vegetables, whole grains, lean protein, water--limit salt, fat, and sugar/simple carb intake). Emmi education: "dash diet', "high blood pressure in adults". Please review and call if you have any questions.      .  COMPLETED: Obtain annual  Lipid Profile, LDL-C        Done 02/21/2020    .  Obtain Annual Eye (retinal)  Exam    On track     Unable to determine. Last documented 06/26/2019. It is important for you to see your provider for yearly recommended exam.    .  COMPLETED: Obtain Annual Foot Exam        Done 07/01/2020    .  COMPLETED: Obtain annual screen for micro albuminuria (urine) , nephropathy (kidney problems)        Done 07/01/2020    .  COMPLETED: Obtain Hemoglobin A1C at least 2 times per year        A1C 7.4 on 05/27/2020; A1C 7.3 on 02/21/2020, A1C 7.2 on 09/13/2019;  A1C 6.8 on 05/04/2019.        [name]  Patient Stated: client will voice receipt of requested information on "foods with a lot of potassium"   On track     Please call your RN care coordinator as needed.    .  COMPLETED: Visit Primary Care Provider or Endocrinologist at least 2 times per year         Provider visit 07/01/20 and 05/27/20       Plan: RNCM will send updated care plan to client; send updated care plan to primary care provider; next outreach per Tier level within the next 6 months.   [name], RN, MSN, [place] [phone]

## 2020-09-11 ENCOUNTER — Other Ambulatory Visit: Payer: Self-pay | Admitting: Internal Medicine

## 2020-09-17 ENCOUNTER — Other Ambulatory Visit: Payer: Self-pay | Admitting: Internal Medicine

## 2020-09-17 DIAGNOSIS — L309 Dermatitis, unspecified: Secondary | ICD-10-CM

## 2020-09-17 MED ORDER — CLOBETASOL PROPIONATE 0.05 % EX OINT: 1.0000 "application " | TOPICAL_OINTMENT | Freq: Two times a day (BID) | CUTANEOUS | 1 refills | Status: DC

## 2020-09-17 MED ORDER — FLUOCINONIDE EMULSIFIED BASE 0.05 % EX CREA: TOPICAL_CREAM | CUTANEOUS | 2 refills | Status: DC

## 2020-09-24 ENCOUNTER — Other Ambulatory Visit: Payer: Self-pay | Admitting: Physician Assistant

## 2020-09-26 ENCOUNTER — Telehealth: Payer: Self-pay | Admitting: Pharmacist

## 2020-09-26 ENCOUNTER — Other Ambulatory Visit: Payer: Self-pay | Admitting: Internal Medicine

## 2020-09-26 NOTE — Progress Notes (Signed)
Chronic Care Management Pharmacy Assistant   Name: Valerie [name]  MRN: [id number] DOB: 10-26-1946  Reason for Encounter: PAP Follow-Up   PCP : Janith Lima, MD  Allergies:   Allergies  Allergen Reactions  . Food Swelling    bananas  . Penicillins Swelling and Rash    Medications: Outpatient Encounter Medications as of 09/26/2020  Medication Sig Note  . albuterol (PROVENTIL) (2.5 MG/3ML) 0.083% nebulizer solution    . albuterol (VENTOLIN HFA) 108 (90 Base) MCG/ACT inhaler Inhale 2 puffs into the lungs every 6 (six) hours as needed. For shortness of breath   . aspirin 81 MG tablet Take 81 mg by mouth daily.    [name] atorvastatin (LIPITOR) 20 MG tablet TAKE 1 TABLET BY MOUTH EVERY DAY   . carvedilol (COREG) 25 MG tablet Take 1 tablet (25 mg total) by mouth 2 (two) times daily with a meal.   . chlorthalidone (HYGROTON) 25 MG tablet TAKE 1 TABLET BY MOUTH EVERY DAY   . Cholecalciferol 50 MCG (2000 UT) TABS Take 1 tablet (2,000 Units total) by mouth daily.   . clobetasol ointment (TEMOVATE) 4.76 % Apply 1 application topically 2 (two) times daily.   . Dulaglutide (TRULICITY) 1.5 LY/6.5KP SOPN Inject 0.5 mLs (1.5 mg total) into the skin once a week.   . DULoxetine (CYMBALTA) 60 MG capsule Take 1 capsule (60 mg total) by mouth daily. (Patient not taking: Reported on 08/06/2020)   . Empagliflozin-metFORMIN HCl (SYNJARDY) 12.5-500 MG TABS Take 1 tablet by mouth 2 (two) times daily.   . famotidine (PEPCID) 40 MG tablet Take 1 tablet (40 mg total) by mouth at bedtime.   . ferrous sulfate 325 (65 FE) MG tablet TAKE 1 TABLET BY MOUTH TWICE DAILY WITH A MEAL   . gabapentin (NEURONTIN) 300 MG capsule TAKE 1 CAPSULE BY MOUTH THREE TIMES DAILY   . glucose blood (FREESTYLE TEST STRIPS) test strip USE TO TEST BLOOD SUGAR 3 TIMES A DAY. DX: E11.8   . Insulin Pen Needle 31G X 5 MM MISC Use once daily with insulin   . irbesartan (AVAPRO) 300 MG tablet Take 0.5 tablets (150 mg total) by mouth daily.  08/06/2020: Patient takes when BP > 150/90  . latanoprost (XALATAN) 0.005 % ophthalmic solution Place 1 drop into both eyes at bedtime.    [name] MYRBETRIQ 25 MG TB24 tablet TAKE 1 TABLET BY MOUTH EVERY DAY   . nystatin ointment (MYCOSTATIN) APPLY ON THE SKIN THREE TIMES DAILY   . oxyCODONE-acetaminophen (PERCOCET/ROXICET) 5-325 MG tablet Take 1 tablet by mouth every 8 (eight) hours as needed for severe pain.   . pantoprazole (PROTONIX) 40 MG tablet TAKE 1 TABLET (40 MG TOTAL) BY MOUTH 2 (TWO) TIMES DAILY BEFORE A MEAL.   [name] potassium chloride SA (KLOR-CON M20) 20 MEQ tablet TAKE 1 TABLET BY MOUTH TWICE A DAY   . [DISCONTINUED] atorvastatin (LIPITOR) 20 MG tablet TAKE 1 TABLET BY MOUTH EVERY DAY   . [DISCONTINUED] chlorthalidone (HYGROTON) 25 MG tablet Take 1 tablet (25 mg total) by mouth daily.   . [DISCONTINUED] chlorthalidone (HYGROTON) 25 MG tablet TAKE 1 TABLET BY MOUTH EVERY DAY   . [DISCONTINUED] glucose blood (FREESTYLE TEST STRIPS) test strip Use to test blood sugar 3 times a day. DX: E11.8    No facility-administered encounter medications on file as of 09/26/2020.    Current Diagnosis: Patient Active Problem List   Diagnosis Date Noted  . Degenerative arthritis of right knee 07/02/2020  .  Moderate episode of recurrent major depressive disorder ([place]) 07/01/2020  . Esophageal dysphagia 05/27/2020  . Gastroesophageal reflux disease with esophagitis without hemorrhage 05/27/2020  . Diuretic-induced hypokalemia 03/27/2020  . OAB (overactive bladder) 03/27/2020  . Insomnia secondary to chronic pain 02/21/2020  . Primary osteoarthritis involving multiple joints 10/10/2019  . Thiamine deficiency, unspecified 01/09/2019  . Eczema 08/18/2018  . Vitamin D deficiency disease 08/11/2018  . Long-term current use of opiate analgesic 06/21/2018  . External bleeding hemorrhoids 03/24/2018  . Obstructive sleep apnea treated with BiPAP 11/03/2017  . Osteopenia 03/28/2014  . Insomnia 03/28/2014  .  Routine general medical examination at a health care facility 07/17/2013  . Morbid obesity with BMI of 45.0-49.9, adult ([place]) 04/14/2013  . Visit for screening mammogram 01/20/2013  . Spinal stenosis of lumbar 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 10/05/2012  . Hypothyroidism 02/20/2011  . Fatty liver disease, nonalcoholic 15/94/5859  . Mild intermittent asthma 10/25/2009  . Type II diabetes mellitus with manifestations ([place]) 05/06/2009  . Hyperlipidemia with target LDL less than 100 05/06/2009  . B12 deficiency anemia 05/06/2009  . Depression with anxiety 05/06/2009  . Essential hypertension, benign 05/06/2009  . GERD 05/06/2009    Goals Addressed   None     Follow-Up:  Pharmacist Review    Called [employer] to check to see if the patients application has been received and processed for Symbicort. Called [phone] and spoke with Navos she stated that the company has received the application but it will need to be resent sue to missing information with the strength of the medication. But she is able to to start the processing of the rest of the application. Spoke with Clinical pharmacist [name] she stated that she will re-send the form to [employer].    Rosendo Gros, [facility]  Practice Team Manager/ CPA (Clinical Pharmacist Assistant) [phone]

## 2020-10-01 ENCOUNTER — Ambulatory Visit (INDEPENDENT_AMBULATORY_CARE_PROVIDER_SITE_OTHER): Payer: HMO | Admitting: Internal Medicine

## 2020-10-01 ENCOUNTER — Other Ambulatory Visit: Payer: Self-pay

## 2020-10-01 ENCOUNTER — Encounter: Payer: Self-pay | Admitting: Internal Medicine

## 2020-10-01 VITALS — BP 132/80 | HR 84 | Temp 98.7°F | Resp 16 | Ht 66.0 in | Wt 249.0 lb

## 2020-10-01 DIAGNOSIS — E118 Type 2 diabetes mellitus with unspecified complications: Secondary | ICD-10-CM

## 2020-10-01 DIAGNOSIS — I10 Essential (primary) hypertension: Secondary | ICD-10-CM | POA: Diagnosis not present

## 2020-10-01 DIAGNOSIS — B354 Tinea corporis: Secondary | ICD-10-CM | POA: Diagnosis not present

## 2020-10-01 LAB — POCT GLYCOSYLATED HEMOGLOBIN (HGB A1C): Hemoglobin A1C: 6.9 % — AB (ref 4.0–5.6)

## 2020-10-01 MED ORDER — TERBINAFINE HCL 250 MG PO TABS: 250.0000 mg | ORAL_TABLET | Freq: Every day | ORAL | 0 refills | Status: AC

## 2020-10-01 NOTE — Patient Instructions (Signed)

## 2020-10-01 NOTE — Progress Notes (Signed)
Subjective:  Patient ID: [name], female    DOB: [birth year]  Age: 74 y.o. MRN: [id number]  CC: Hypertension, Diabetes, and Rash  This visit occurred during the SARS-CoV-2 public health emergency.  Safety protocols were in place, including screening questions prior to the visit, additional usage of staff PPE, and extensive cleaning of exam room while observing appropriate contact time as indicated for disinfecting solutions.    HPI KAYLEE TRIVETT presents for f/up - She continues to complain of a rash on her forearm.  She was here recently with her son and she stopped me in the hallway and asked for me to prescribe a stronger steroid.  She has been using a potent topical steroid ointment.  The rash does not bother her much.  She owns a dog and is afraid she may have ringworm.  Outpatient Medications Prior to Visit  Medication Sig Dispense Refill  . albuterol (PROVENTIL) (2.5 MG/3ML) 0.083% nebulizer solution     . albuterol (VENTOLIN HFA) 108 (90 Base) MCG/ACT inhaler Inhale 2 puffs into the lungs every 6 (six) hours as needed. For shortness of breath 3 Inhaler 4  . aspirin 81 MG tablet Take 81 mg by mouth daily.     [name] atorvastatin (LIPITOR) 20 MG tablet TAKE 1 TABLET BY MOUTH EVERY DAY 90 tablet 1  . carvedilol (COREG) 25 MG tablet Take 1 tablet (25 mg total) by mouth 2 (two) times daily with a meal. 180 tablet 0  . chlorthalidone (HYGROTON) 25 MG tablet TAKE 1 TABLET BY MOUTH EVERY DAY 90 tablet 0  . Cholecalciferol 50 MCG (2000 UT) TABS Take 1 tablet (2,000 Units total) by mouth daily. 90 tablet 1  . clobetasol ointment (TEMOVATE) 6.26 % Apply 1 application topically 2 (two) times daily. 60 g 1  . Dulaglutide (TRULICITY) 1.5 RS/8.5IO SOPN Inject 0.5 mLs (1.5 mg total) into the skin once a week. 4 pen 0  . DULoxetine (CYMBALTA) 60 MG capsule Take 1 capsule (60 mg total) by mouth daily. 90 capsule 1  . Empagliflozin-metFORMIN HCl (SYNJARDY) 12.5-500 MG TABS Take 1 tablet by  mouth 2 (two) times daily. 180 tablet 3  . famotidine (PEPCID) 40 MG tablet Take 1 tablet (40 mg total) by mouth at bedtime. 90 tablet 1  . ferrous sulfate 325 (65 FE) MG tablet TAKE 1 TABLET BY MOUTH TWICE DAILY WITH A MEAL 180 tablet 1  . gabapentin (NEURONTIN) 300 MG capsule TAKE 1 CAPSULE BY MOUTH THREE TIMES DAILY 270 capsule 1  . glucose blood (FREESTYLE TEST STRIPS) test strip USE TO TEST BLOOD SUGAR 3 TIMES A DAY. DX: E11.8 100 strip 11  . Insulin Pen Needle 31G X 5 MM MISC Use once daily with insulin 100 each 3  . irbesartan (AVAPRO) 300 MG tablet Take 0.5 tablets (150 mg total) by mouth daily. 90 tablet 1  . latanoprost (XALATAN) 0.005 % ophthalmic solution Place 1 drop into both eyes at bedtime.     [name] MYRBETRIQ 25 MG TB24 tablet TAKE 1 TABLET BY MOUTH EVERY DAY 90 tablet 0  . nystatin ointment (MYCOSTATIN) APPLY ON THE SKIN THREE TIMES DAILY    . oxyCODONE-acetaminophen (PERCOCET/ROXICET) 5-325 MG tablet Take 1 tablet by mouth every 8 (eight) hours as needed for severe pain. 90 tablet 0  . pantoprazole (PROTONIX) 40 MG tablet TAKE 1 TABLET (40 MG TOTAL) BY MOUTH 2 (TWO) TIMES DAILY BEFORE A MEAL. 180 tablet 1  . potassium chloride SA (KLOR-CON M20) 20  MEQ tablet TAKE 1 TABLET BY MOUTH TWICE A DAY 180 tablet 1   No facility-administered medications prior to visit.    ROS Review of Systems  Constitutional: Negative.  Negative for chills, diaphoresis, fatigue and fever.  HENT: Negative.   Eyes: Negative.   Respiratory: Negative for cough, chest tightness, shortness of breath and wheezing.   Cardiovascular: Negative for chest pain, palpitations and leg swelling.  Gastrointestinal: Negative for abdominal pain, constipation, diarrhea, nausea and vomiting.  Endocrine: Negative.   Genitourinary: Negative.  Negative for difficulty urinating.  Musculoskeletal: Negative.  Negative for arthralgias and myalgias.  Skin: Positive for rash. Negative for color change.  Neurological: Negative.   Negative for dizziness, weakness and light-headedness.  Hematological: Negative for adenopathy. Does not bruise/bleed easily.  Psychiatric/Behavioral: Negative.     Objective:  BP 132/80   Pulse 84   Temp 98.7 F (37.1 C) (Oral)   Resp 16   Ht 5\' 6"  (1.676 m)   Wt 249 lb (112.9 kg)   SpO2 98%   BMI 40.19 kg/m   BP Readings from Last 3 Encounters:  10/01/20 132/80  08/14/20 (!) 155/94  08/01/20 120/70    Wt Readings from Last 3 Encounters:  10/01/20 249 lb (112.9 kg)  08/14/20 247 lb (112 kg)  08/01/20 249 lb 12.8 oz (113.3 kg)    Physical Exam Vitals reviewed.  HENT:     Nose: Nose normal.     Mouth/Throat:     Mouth: Mucous membranes are moist.  Eyes:     General: No scleral icterus.    Conjunctiva/sclera: Conjunctivae normal.  Cardiovascular:     Rate and Rhythm: Normal rate and regular rhythm.     Heart sounds: No murmur heard.   Pulmonary:     Effort: Pulmonary effort is normal.     Breath sounds: No stridor. No wheezing, rhonchi or rales.  Abdominal:     General: Abdomen is flat.     Palpations: There is no mass.     Tenderness: There is no abdominal tenderness.  Musculoskeletal:        General: Normal range of motion.     Cervical back: Neck supple.  Lymphadenopathy:     Cervical: No cervical adenopathy.  Skin:    General: Skin is warm.     Coloration: Skin is not jaundiced.     Findings: Rash present. No erythema.     Comments: Dorsum of right forearm.  There is a patch with central clearing and hyperpigmentation on the edges.  There is no scale, peeling, papules, or vesicles.  See photo.  Neurological:     General: No focal deficit present.     Mental Status: She is alert.  Psychiatric:        Mood and Affect: Mood normal.        Behavior: Behavior normal.     Lab Results  Component Value Date   WBC 10.4 07/01/2020   HGB 11.8 07/01/2020   HCT 35.3 07/01/2020   PLT 359 07/01/2020   GLUCOSE 125 (H) 05/27/2020   CHOL 144 02/21/2020    TRIG 272.0 (H) 02/21/2020   HDL 47.30 02/21/2020   LDLDIRECT 57.0 02/21/2020   LDLCALC 43 03/28/2014   ALT 18 02/21/2020   AST 18 02/21/2020   NA 137 05/27/2020   K 3.5 05/27/2020   CL 98 05/27/2020   CREATININE 0.91 05/27/2020   BUN 20 05/27/2020   CO2 27 05/27/2020   TSH 3.79 07/01/2020   INR 1.0  02/21/2020   HGBA1C 6.9 (A) 10/01/2020   MICROALBUR 0.5 07/01/2020    MYOCARDIAL PERFUSION IMAGING  Result Date: 10/07/2018  Nuclear stress EF: 75%.  Normal perfusion  The study is normal.  This is a low risk study.     Assessment & Plan:   Mildred was seen today for hypertension, diabetes and rash.  Diagnoses and all orders for this visit:  Type II diabetes mellitus with manifestations ([place])- Her blood sugar is adequately well controlled. -     POCT glycosylated hemoglobin (Hb A1C)  Tinea corporis- This is suspicious for ringworm.  Will treat with a 7-day course of systemic terbinafine. -     terbinafine (LAMISIL) 250 MG tablet; Take 1 tablet (250 mg total) by mouth daily for 7 days.  Essential hypertension, benign- Her blood pressure is adequately well controlled.   I am having Avrielle A. Jeng start on terbinafine. I am also having her maintain her latanoprost, aspirin, albuterol, albuterol, Insulin Pen Needle, ferrous sulfate, irbesartan, nystatin ointment, FREESTYLE TEST STRIPS, atorvastatin, Trulicity, famotidine, potassium chloride SA, Myrbetriq, Synjardy, oxyCODONE-acetaminophen, Cholecalciferol, DULoxetine, chlorthalidone, carvedilol, gabapentin, clobetasol ointment, and pantoprazole.  Meds ordered this encounter  Medications  . terbinafine (LAMISIL) 250 MG tablet    Sig: Take 1 tablet (250 mg total) by mouth daily for 7 days.    Dispense:  7 tablet    Refill:  0     Follow-up: Return in about 4 months (around 02/01/2021).  [name], MD

## 2020-10-02 ENCOUNTER — Telehealth: Payer: Self-pay | Admitting: Pharmacist

## 2020-10-02 NOTE — Progress Notes (Addendum)
Chronic Care Management Pharmacy Assistant   Name: Valerie [name]  MRN: [id number] DOB: Nov 20, 1946  Reason for Encounter: PAP Follow up    PCP : Janith Lima, MD  Allergies:   Allergies  Allergen Reactions  . Food Swelling    bananas  . Penicillins Swelling and Rash    Medications: Outpatient Encounter Medications as of 10/02/2020  Medication Sig Note  . albuterol (PROVENTIL) (2.5 MG/3ML) 0.083% nebulizer solution    . albuterol (VENTOLIN HFA) 108 (90 Base) MCG/ACT inhaler Inhale 2 puffs into the lungs every 6 (six) hours as needed. For shortness of breath   . aspirin 81 MG tablet Take 81 mg by mouth daily.    [name] atorvastatin (LIPITOR) 20 MG tablet TAKE 1 TABLET BY MOUTH EVERY DAY   . carvedilol (COREG) 25 MG tablet Take 1 tablet (25 mg total) by mouth 2 (two) times daily with a meal.   . chlorthalidone (HYGROTON) 25 MG tablet TAKE 1 TABLET BY MOUTH EVERY DAY   . Cholecalciferol 50 MCG (2000 UT) TABS Take 1 tablet (2,000 Units total) by mouth daily.   . clobetasol ointment (TEMOVATE) 1.19 % Apply 1 application topically 2 (two) times daily.   . Dulaglutide (TRULICITY) 1.5 ER/7.4YC SOPN Inject 0.5 mLs (1.5 mg total) into the skin once a week.   . DULoxetine (CYMBALTA) 60 MG capsule Take 1 capsule (60 mg total) by mouth daily.   . Empagliflozin-metFORMIN HCl (SYNJARDY) 12.5-500 MG TABS Take 1 tablet by mouth 2 (two) times daily.   . famotidine (PEPCID) 40 MG tablet Take 1 tablet (40 mg total) by mouth at bedtime.   . ferrous sulfate 325 (65 FE) MG tablet TAKE 1 TABLET BY MOUTH TWICE DAILY WITH A MEAL   . gabapentin (NEURONTIN) 300 MG capsule TAKE 1 CAPSULE BY MOUTH THREE TIMES DAILY   . glucose blood (FREESTYLE TEST STRIPS) test strip USE TO TEST BLOOD SUGAR 3 TIMES A DAY. DX: E11.8   . Insulin Pen Needle 31G X 5 MM MISC Use once daily with insulin   . irbesartan (AVAPRO) 300 MG tablet Take 0.5 tablets (150 mg total) by mouth daily. 08/06/2020: Patient takes when BP > 150/90    . latanoprost (XALATAN) 0.005 % ophthalmic solution Place 1 drop into both eyes at bedtime.    [name] MYRBETRIQ 25 MG TB24 tablet TAKE 1 TABLET BY MOUTH EVERY DAY   . nystatin ointment (MYCOSTATIN) APPLY ON THE SKIN THREE TIMES DAILY   . oxyCODONE-acetaminophen (PERCOCET/ROXICET) 5-325 MG tablet Take 1 tablet by mouth every 8 (eight) hours as needed for severe pain.   . pantoprazole (PROTONIX) 40 MG tablet TAKE 1 TABLET (40 MG TOTAL) BY MOUTH 2 (TWO) TIMES DAILY BEFORE A MEAL.   [name] potassium chloride SA (KLOR-CON M20) 20 MEQ tablet TAKE 1 TABLET BY MOUTH TWICE A DAY   . terbinafine (LAMISIL) 250 MG tablet Take 1 tablet (250 mg total) by mouth daily for 7 days.   . [DISCONTINUED] atorvastatin (LIPITOR) 20 MG tablet TAKE 1 TABLET BY MOUTH EVERY DAY   . [DISCONTINUED] chlorthalidone (HYGROTON) 25 MG tablet Take 1 tablet (25 mg total) by mouth daily.   . [DISCONTINUED] chlorthalidone (HYGROTON) 25 MG tablet TAKE 1 TABLET BY MOUTH EVERY DAY   . [DISCONTINUED] glucose blood (FREESTYLE TEST STRIPS) test strip Use to test blood sugar 3 times a day. DX: E11.8    No facility-administered encounter medications on file as of 10/02/2020.    Current Diagnosis: Patient Active  Problem List   Diagnosis Date Noted  . Tinea corporis 10/01/2020  . Degenerative arthritis of right knee 07/02/2020  . Moderate episode of recurrent major depressive disorder ([place]) 07/01/2020  . Esophageal dysphagia 05/27/2020  . Gastroesophageal reflux disease with esophagitis without hemorrhage 05/27/2020  . Diuretic-induced hypokalemia 03/27/2020  . OAB (overactive bladder) 03/27/2020  . Insomnia secondary to chronic pain 02/21/2020  . Primary osteoarthritis involving multiple joints 10/10/2019  . Thiamine deficiency, unspecified 01/09/2019  . Eczema 08/18/2018  . Vitamin D deficiency disease 08/11/2018  . Long-term current use of opiate analgesic 06/21/2018  . External bleeding hemorrhoids 03/24/2018  . Obstructive sleep apnea  treated with BiPAP 11/03/2017  . Osteopenia 03/28/2014  . Insomnia 03/28/2014  . Routine general medical examination at a health care facility 07/17/2013  . Morbid obesity with BMI of 45.0-49.9, adult ([place]) 04/14/2013  . Visit for screening mammogram 01/20/2013  . Spinal stenosis of lumbar 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 10/05/2012  . Hypothyroidism 02/20/2011  . Fatty liver disease, nonalcoholic 56/97/9480  . Mild intermittent asthma 10/25/2009  . Type II diabetes mellitus with manifestations ([place]) 05/06/2009  . Hyperlipidemia with target LDL less than 100 05/06/2009  . B12 deficiency anemia 05/06/2009  . Depression with anxiety 05/06/2009  . Essential hypertension, benign 05/06/2009  . GERD 05/06/2009    Goals Addressed   None     Follow-Up:  Pharmacist Review   Called [employer] to check on the status of the patients [employer] application, spoke with a rep they stated that the patients application has been approved until 12/13/2020. [employer] requested a new RX to to faxed to them.   The patient is needing a refill on her Synjardy through patient assistance, after speaking with the patient she was informed to call [facility] refill line to request a delivery: [place], [place] Team Manager/ CPA (Clinical Pharmacist Assistant) [phone]

## 2020-10-29 ENCOUNTER — Ambulatory Visit (INDEPENDENT_AMBULATORY_CARE_PROVIDER_SITE_OTHER): Payer: HMO | Admitting: Cardiovascular Disease

## 2020-10-29 ENCOUNTER — Other Ambulatory Visit: Payer: Self-pay

## 2020-10-29 ENCOUNTER — Encounter: Payer: Self-pay | Admitting: Cardiovascular Disease

## 2020-10-29 DIAGNOSIS — R0789 Other chest pain: Secondary | ICD-10-CM

## 2020-10-29 DIAGNOSIS — I10 Essential (primary) hypertension: Secondary | ICD-10-CM

## 2020-10-29 DIAGNOSIS — G4733 Obstructive sleep apnea (adult) (pediatric): Secondary | ICD-10-CM | POA: Diagnosis not present

## 2020-10-29 DIAGNOSIS — E785 Hyperlipidemia, unspecified: Secondary | ICD-10-CM

## 2020-10-29 DIAGNOSIS — R002 Palpitations: Secondary | ICD-10-CM

## 2020-10-29 NOTE — Assessment & Plan Note (Signed)
History of atypical chest pain negative Myoview stress test performed 10/07/2018

## 2020-10-29 NOTE — Patient Instructions (Signed)
Medication Instructions:  Your physician recommends that you continue on your current medications as directed. Please refer to the Current Medication list given to you today.  *If you need a refill on your cardiac medications before your next appointment, please call your pharmacy*   Follow-Up: At [facility], you and your health needs are our priority.  As part of our continuing mission to provide you with exceptional heart care, we have created designated Provider Care Teams.  These Care Teams include your primary Cardiologist (physician) and Advanced Practice Providers (APPs -  Physician Assistants and Nurse Practitioners) who all work together to provide you with the care you need, when you need it.  We recommend signing up for the patient portal called "MyChart".  Sign up information is provided on this After Visit Summary.  MyChart is used to connect with patients for Virtual Visits (Telemedicine).  Patients are able to view lab/test results, encounter notes, upcoming appointments, etc.  Non-urgent messages can be sent to your provider as well.   To learn more about what you can do with MyChart, go to [email or web address].    Your next appointment:   6 month(s)  The format for your next appointment:   In Person  Provider:   You will see one of the following Advanced Practice Providers on your designated Care Team:   Hao Meng, PA-C  Then, Jonathan Berry, MD will plan to see you again in 12 month(s). 

## 2020-10-29 NOTE — Assessment & Plan Note (Signed)
History of essential hypertension a blood pressure measured today 127/72. She is on carvedilol and Avapro.

## 2020-10-29 NOTE — Assessment & Plan Note (Signed)
History of palpitations with event monitor that shows PVCs with short atrial runs. She is on high-dose carvedilol. She drinks a soft drink at least on a daily basis. These have not changed in frequency or severity. We talked about adding additional medicine such as Cardizem but at this time she wishes to continue on her current medications.

## 2020-10-29 NOTE — Patient Outreach (Signed)
  [facility]) Care Management Chronic Special Needs Program    10/29/2020  Name: Valerie [name], DOB: 1946-11-13  MRN: [id number]   Valerie [name] is enrolled in a chronic special needs plan for Diabetes. [facility] will continue to provide services for this member through 12/13/2020. The [facility] Care Management Team will assume care 12/14/2020.   [name], RN, MSN, [place] [phone]

## 2020-10-29 NOTE — Progress Notes (Signed)
10/29/2020 Valerie [name]   October 25, 1946  [id number]  Primary Physician Janith Lima, MD Primary Cardiologist: Lorretta Harp MD [name], [place]  HPI:  Valerie [name] is a 74 y.o.  morbidly overweight married African-American female mother 18, grandmother of 5 grandchildren who was in the [place] and was referred by [name] for cardiovascular evaluation because of palpitations.I last  saw her in the office 04/30/2020. She has been evaluated by Drs. Hochrein and Oval Linsey in the past. She saw Dr. Oval Linsey a little over 3 years ago which time she had an echo that was essentially normal and a 2-day protocol Myoview that was normal as well. She does have a history of treated hypertension, diabetes and hyperlipidemia. She wears BiPAP for obstructive sleep apnea and his family history for heart disease with both father and brother who died of myocardial infarctions. He is never had a heart attack or stroke. She does get chronic chest pain every several days and is chronically short of breath. She also has reactive airways disease. He started having palpitations several months ago and these occur several times a week lasting seconds to minutes at a time.  She has had a Myoview stress test and 2D echo all of which were normal. She also had a monitor placed because of palpitations 10/03/2018 that showed sinus rhythm with occasional PVCs and PACs. Her palpitations have resolved since I last saw her. She denies chest pain or shortness of breath.  Since I spoke to her on the phone a year ago she has had recurrent palpitations with an event monitor placed 03/01/2020 revealing occasional PVCs with short atrial runs.  She does admit to drinking coffee and sodas.  Since I saw her 6 months ago she does get occasional palpitations atypical chest pain which have really not changed in frequency or severity.   Current Meds  Medication Sig  . albuterol (PROVENTIL) (2.5  MG/3ML) 0.083% nebulizer solution   . albuterol (VENTOLIN HFA) 108 (90 Base) MCG/ACT inhaler Inhale 2 puffs into the lungs every 6 (six) hours as needed. For shortness of breath  . aspirin 81 MG tablet Take 81 mg by mouth daily.   [name] atorvastatin (LIPITOR) 20 MG tablet TAKE 1 TABLET BY MOUTH EVERY DAY  . budesonide-formoterol (SYMBICORT) 80-4.5 MCG/ACT inhaler Inhale 2 puffs into the lungs as needed.   . carvedilol (COREG) 25 MG tablet Take 1 tablet (25 mg total) by mouth 2 (two) times daily with a meal.  . chlorthalidone (HYGROTON) 25 MG tablet TAKE 1 TABLET BY MOUTH EVERY DAY  . Cholecalciferol 50 MCG (2000 UT) TABS Take 1 tablet (2,000 Units total) by mouth daily.  . clobetasol ointment (TEMOVATE) 5.36 % Apply 1 application topically 2 (two) times daily.  . Dulaglutide (TRULICITY) 1.5 IW/8.0HO SOPN Inject 0.5 mLs (1.5 mg total) into the skin once a week.  . DULoxetine (CYMBALTA) 60 MG capsule Take 1 capsule (60 mg total) by mouth daily.  . Empagliflozin-metFORMIN HCl (SYNJARDY) 12.5-500 MG TABS Take 1 tablet by mouth 2 (two) times daily.  . famotidine (PEPCID) 40 MG tablet Take 1 tablet (40 mg total) by mouth at bedtime.  . ferrous sulfate 325 (65 FE) MG tablet TAKE 1 TABLET BY MOUTH TWICE DAILY WITH A MEAL  . gabapentin (NEURONTIN) 300 MG capsule TAKE 1 CAPSULE BY MOUTH THREE TIMES DAILY  . glucose blood (FREESTYLE TEST STRIPS) test strip USE TO TEST BLOOD SUGAR 3 TIMES A DAY. DX: E11.8  .  Insulin Pen Needle 31G X 5 MM MISC Use once daily with insulin  . irbesartan (AVAPRO) 300 MG tablet Take 0.5 tablets (150 mg total) by mouth daily.  [name] latanoprost (XALATAN) 0.005 % ophthalmic solution Place 1 drop into both eyes at bedtime.   [name] MYRBETRIQ 25 MG TB24 tablet TAKE 1 TABLET BY MOUTH EVERY DAY  . nystatin ointment (MYCOSTATIN) APPLY ON THE SKIN THREE TIMES DAILY  . oxyCODONE-acetaminophen (PERCOCET/ROXICET) 5-325 MG tablet Take 1 tablet by mouth every 8 (eight) hours as needed for severe pain.    . pantoprazole (PROTONIX) 40 MG tablet TAKE 1 TABLET (40 MG TOTAL) BY MOUTH 2 (TWO) TIMES DAILY BEFORE A MEAL.  [name] potassium chloride SA (KLOR-CON M20) 20 MEQ tablet TAKE 1 TABLET BY MOUTH TWICE A DAY     Allergies  Allergen Reactions  . Food Swelling    bananas  . Penicillins Swelling and Rash    Social History   Socioeconomic History  . Marital status: Widowed    Spouse name: Not on file  . Number of children: 3  . Years of education: 11th  . Highest education level: 11th grade  Occupational History  . Occupation: disabled  Tobacco Use  . Smoking status: Never Smoker  . Smokeless tobacco: Never Used  Vaping Use  . Vaping Use: Never used  Substance and Sexual Activity  . Alcohol use: No    Alcohol/week: 0.0 standard drinks  . Drug use: No  . Sexual activity: Not Currently    Comment: not working, lives with husband, 3 sons  Other Topics Concern  . Not on file  Social History Narrative   Client's spouse recently passed Feb 2021    also caregiver for son who is 51 yo and is disabled.   Social Determinants of Health   Financial Resource Strain: High Risk  . Difficulty of Paying Living Expenses: Hard  Food Insecurity: No Food Insecurity  . Worried About Charity fundraiser in the Last Year: Never true  . Ran Out of Food in the Last Year: Never true  Transportation Needs: No Transportation Needs  . Lack of Transportation (Medical): No  . Lack of Transportation (Non-Medical): No  Physical Activity: Inactive  . Days of Exercise per Week: 0 days  . Minutes of Exercise per Session: 0 min  Stress: Stress Concern Present  . Feeling of Stress : Rather much  Social Connections: Moderately Integrated  . Frequency of Communication with Friends and Family: More than three times a week  . Frequency of Social Gatherings with Friends and Family: More than three times a week  . Attends Religious Services: More than 4 times per year  . Active Member of Clubs or Organizations:  Yes  . Attends Archivist Meetings: More than 4 times per year  . Marital Status: Widowed  Intimate Partner Violence: Not At Risk  . Fear of Current or Ex-Partner: No  . Emotionally Abused: No  . Physically Abused: No  . Sexually Abused: No     Review of Systems: General: negative for chills, fever, night sweats or weight changes.  Cardiovascular: negative for chest pain, dyspnea on exertion, edema, orthopnea, palpitations, paroxysmal nocturnal dyspnea or shortness of breath Dermatological: negative for rash Respiratory: negative for cough or wheezing Urologic: negative for hematuria Abdominal: negative for nausea, vomiting, diarrhea, bright red blood per rectum, melena, or hematemesis Neurologic: negative for visual changes, syncope, or dizziness All other systems reviewed and are otherwise negative except as noted above.  Blood pressure 127/72, pulse 81, height 5\' 6"  (1.676 m), weight 249 lb 3.2 oz (113 kg), SpO2 99 %.  General appearance: alert and no distress Neck: no adenopathy, no carotid bruit, no JVD, supple, symmetrical, trachea midline and thyroid not enlarged, symmetric, no tenderness/mass/nodules Lungs: clear to auscultation bilaterally Heart: regular rate and rhythm, S1, S2 normal, no murmur, click, rub or gallop Extremities: extremities normal, atraumatic, no cyanosis or edema Pulses: 2+ and symmetric Skin: Skin color, texture, turgor normal. No rashes or lesions Neurologic: Alert and oriented X 3, normal strength and tone. Normal symmetric reflexes. Normal coordination and gait  EKG not performed today  ASSESSMENT AND PLAN:   Hyperlipidemia with target LDL less than 100 History of hyperlipidemia on statin therapy with lipid profile performed 02/21/2020 revealing total cholesterol 144, LDL 57 and HDL 47.  Essential hypertension, benign History of essential hypertension a blood pressure measured today 127/72. She is on carvedilol and  Avapro.  Obstructive sleep apnea treated with BiPAP History of obstructive sleep apnea on BiPAP  Atypical chest pain History of atypical chest pain negative Myoview stress test performed 10/07/2018  Palpitations History of palpitations with event monitor that shows PVCs with short atrial runs. She is on high-dose carvedilol. She drinks a soft drink at least on a daily basis. These have not changed in frequency or severity. We talked about adding additional medicine such as Cardizem but at this time she wishes to continue on her current medications.      Lorretta Harp MD FACP,FACC,FAHA, [facility] 10/29/2020 11:25 AM

## 2020-10-29 NOTE — Assessment & Plan Note (Signed)
History of hyperlipidemia on statin therapy with lipid profile performed 02/21/2020 revealing total cholesterol 144, LDL 57 and HDL 47.

## 2020-10-29 NOTE — Assessment & Plan Note (Signed)
History of obstructive sleep apnea on BiPAP 

## 2020-10-31 ENCOUNTER — Other Ambulatory Visit: Payer: Self-pay

## 2020-10-31 ENCOUNTER — Ambulatory Visit: Payer: HMO | Admitting: Family Medicine

## 2020-10-31 ENCOUNTER — Ambulatory Visit (INDEPENDENT_AMBULATORY_CARE_PROVIDER_SITE_OTHER): Payer: HMO

## 2020-10-31 VITALS — BP 118/82 | Wt 249.0 lb

## 2020-10-31 DIAGNOSIS — M79671 Pain in right foot: Secondary | ICD-10-CM | POA: Diagnosis not present

## 2020-10-31 DIAGNOSIS — M25571 Pain in right ankle and joints of right foot: Secondary | ICD-10-CM | POA: Diagnosis not present

## 2020-10-31 DIAGNOSIS — M7989 Other specified soft tissue disorders: Secondary | ICD-10-CM | POA: Diagnosis not present

## 2020-10-31 DIAGNOSIS — G8929 Other chronic pain: Secondary | ICD-10-CM

## 2020-10-31 NOTE — Progress Notes (Signed)
[facility] [place] Phone: [phone] Subjective:   Fontaine No, am serving as a scribe for Dr. Hulan Saas. This visit occurred during the SARS-CoV-2 public health emergency.  Safety protocols were in place, including screening questions prior to the visit, additional usage of staff PPE, and extensive cleaning of exam room while observing appropriate contact time as indicated for disinfecting solutions.   I'm seeing this patient by the request  of:  Janith Lima, MD  CC: Bilateral heel pain  NAT:[id number]  Valerie [name] is a 74 y.o. female coming in with complaint of bilateral heel pain. Patient has had pain for a few days. Can hardly walk. Pain in heel of right foot and minimally in left foot. Is unable to walk barefooted.  Patient states that the right is significantly worse than the left.  Patient states he gets worse with activity.   Past Medical History:  Diagnosis Date  . Anemia   . Anxiety and depression   . Arthritis   . Asthma   . Cataract   . Degenerative arthritis   . Depression   . Diabetes mellitus, type 2 ([place])   . Fatigue   . GERD (gastroesophageal reflux disease)   . Glaucoma   . Heart palpitations   . Hemorrhoids   . Hyperlipidemia   . Hypertension   . Hypothyroidism   . Hypoxemia 11/24/2013  . Memory deficit 10/04/2013  . Obesity   . Sleep apnea    BiPAP  . Snoring disorder    Past Surgical History:  Procedure Laterality Date  . benign tumors resected    . CATARACT EXTRACTION Left   . FOOT SURGERY Left    bone spur  . REFRACTIVE SURGERY     Glaucoma  . ROTATOR CUFF REPAIR Left   . TUBAL LIGATION     Social History   Socioeconomic History  . Marital status: Widowed    Spouse name: Not on file  . Number of children: 3  . Years of education: 11th  . Highest education level: 11th grade  Occupational History  . Occupation: disabled  Tobacco Use  . Smoking status:  Never Smoker  . Smokeless tobacco: Never Used  Vaping Use  . Vaping Use: Never used  Substance and Sexual Activity  . Alcohol use: No    Alcohol/week: 0.0 standard drinks  . Drug use: No  . Sexual activity: Not Currently    Comment: not working, lives with husband, 3 sons  Other Topics Concern  . Not on file  Social History Narrative   Client's spouse recently passed Feb 2021    also caregiver for son who is 27 yo and is disabled.   Social Determinants of Health   Financial Resource Strain: High Risk  . Difficulty of Paying Living Expenses: Hard  Food Insecurity: No Food Insecurity  . Worried About Charity fundraiser in the Last Year: Never true  . Ran Out of Food in the Last Year: Never true  Transportation Needs: No Transportation Needs  . Lack of Transportation (Medical): No  . Lack of Transportation (Non-Medical): No  Physical Activity: Inactive  . Days of Exercise per Week: 0 days  . Minutes of Exercise per Session: 0 min  Stress: Stress Concern Present  . Feeling of Stress : Rather much  Social Connections: Moderately Integrated  . Frequency of Communication with Friends and Family: More than three times a week  . Frequency  of Social Gatherings with Friends and Family: More than three times a week  . Attends Religious Services: More than 4 times per year  . Active Member of Clubs or Organizations: Yes  . Attends Archivist Meetings: More than 4 times per year  . Marital Status: Widowed   Allergies  Allergen Reactions  . Food Swelling    bananas  . Penicillins Swelling and Rash   Family History  Problem Relation Age of Onset  . Alcohol abuse Mother   . Heart attack Mother   . Coronary artery disease Brother   . Heart attack Brother   . Hypertension Brother   . Hyperlipidemia Brother   . Heart attack Father   . Hypertension Father   . Hyperlipidemia Father   . Heart disease Sister   . Atrial fibrillation Sister   . Hypertension Sister   .  Hyperlipidemia Sister   . Hypertension Other        family history  . Alcohol abuse Other   . Colon cancer Brother 45  . Hypertension Brother   . Hyperlipidemia Brother   . Dementia Brother   . Hypertension Brother   . Hyperlipidemia Brother   . Diabetes Son   . Hypertension Son   . Hyperlipidemia Son   . Diabetes Son   . Hypertension Son   . Hyperlipidemia Son   . Cerebral palsy Son   . Hypertension Son   . Hyperlipidemia Son     Current Outpatient Medications (Endocrine & Metabolic):  [name]  Dulaglutide (TRULICITY) 1.5 FY/1.0FB SOPN, Inject 0.5 mLs (1.5 mg total) into the skin once a week. .  Empagliflozin-metFORMIN HCl (SYNJARDY) 12.5-500 MG TABS, Take 1 tablet by mouth 2 (two) times daily.  Current Outpatient Medications (Cardiovascular):  .  atorvastatin (LIPITOR) 20 MG tablet, TAKE 1 TABLET BY MOUTH EVERY DAY .  carvedilol (COREG) 25 MG tablet, Take 1 tablet (25 mg total) by mouth 2 (two) times daily with a meal. .  chlorthalidone (HYGROTON) 25 MG tablet, TAKE 1 TABLET BY MOUTH EVERY DAY .  irbesartan (AVAPRO) 300 MG tablet, Take 0.5 tablets (150 mg total) by mouth daily.  Current Outpatient Medications (Respiratory):  .  albuterol (PROVENTIL) (2.5 MG/3ML) 0.083% nebulizer solution,  .  albuterol (VENTOLIN HFA) 108 (90 Base) MCG/ACT inhaler, Inhale 2 puffs into the lungs every 6 (six) hours as needed. For shortness of breath .  budesonide-formoterol (SYMBICORT) 80-4.5 MCG/ACT inhaler, Inhale 2 puffs into the lungs as needed.   Current Outpatient Medications (Analgesics):  .  aspirin 81 MG tablet, Take 81 mg by mouth daily.  [name]  oxyCODONE-acetaminophen (PERCOCET/ROXICET) 5-325 MG tablet, Take 1 tablet by mouth every 8 (eight) hours as needed for severe pain.  Current Outpatient Medications (Hematological):  .  ferrous sulfate 325 (65 FE) MG tablet, TAKE 1 TABLET BY MOUTH TWICE DAILY WITH A MEAL  Current Outpatient Medications (Other):  [name]  Cholecalciferol 50 MCG (2000 UT)  TABS, Take 1 tablet (2,000 Units total) by mouth daily. .  clobetasol ointment (TEMOVATE) 5.10 %, Apply 1 application topically 2 (two) times daily. .  DULoxetine (CYMBALTA) 60 MG capsule, Take 1 capsule (60 mg total) by mouth daily. .  famotidine (PEPCID) 40 MG tablet, Take 1 tablet (40 mg total) by mouth at bedtime. .  gabapentin (NEURONTIN) 300 MG capsule, TAKE 1 CAPSULE BY MOUTH THREE TIMES DAILY .  glucose blood (FREESTYLE TEST STRIPS) test strip, USE TO TEST BLOOD SUGAR 3 TIMES A DAY. DX: E11.8 .  Insulin Pen Needle 31G X 5 MM MISC, Use once daily with insulin .  latanoprost (XALATAN) 0.005 % ophthalmic solution, Place 1 drop into both eyes at bedtime.  [name]  MYRBETRIQ 25 MG TB24 tablet, TAKE 1 TABLET BY MOUTH EVERY DAY .  nystatin ointment (MYCOSTATIN), APPLY ON THE SKIN THREE TIMES DAILY .  pantoprazole (PROTONIX) 40 MG tablet, TAKE 1 TABLET (40 MG TOTAL) BY MOUTH 2 (TWO) TIMES DAILY BEFORE A MEAL. [name]  potassium chloride SA (KLOR-CON M20) 20 MEQ tablet, TAKE 1 TABLET BY MOUTH TWICE A DAY   Reviewed prior external information including notes and imaging from  primary care provider As well as notes that were available from care everywhere and other healthcare systems.  Past medical history, social, surgical and family history all reviewed in electronic medical record.  No pertanent information unless stated regarding to the chief complaint.   Review of Systems:  No headache, visual changes, nausea, vomiting, diarrhea, constipation, dizziness, abdominal pain, skin rash, fevers, chills, night sweats, weight loss, swollen lymph nodes, body aches, joint swelling, chest pain, shortness of breath, mood changes. POSITIVE muscle aches, body aches  Objective  Blood pressure 118/82, weight 249 lb (112.9 kg).   General: No apparent distress alert and oriented x3 mood and affect normal, dressed appropriately.  HEENT: Pupils equal, extraocular movements intact  Respiratory: Patient's speak in full  sentences and does not appear short of breath  Trace effusion of the lower extremities bilaterally but symmetric Severely antalgic gait noted. Patient's right foot show significant pes planus with overpronation of the hindfoot.  Severely tender to palpation over the plantar aspect of the foot.  No pain over the Achilles.  Negative Thompson.  Neurovascularly intact distally.    Impression and Recommendations:     The above documentation has been reviewed and is accurate and complete Lyndal Pulley, DO

## 2020-10-31 NOTE — Patient Instructions (Signed)
Arnica lotion Do not drive in boot If you do not use the boot, use heel lift Xray right ankle See me again in 4 weeks

## 2020-11-01 DIAGNOSIS — G479 Sleep disorder, unspecified: Secondary | ICD-10-CM | POA: Diagnosis not present

## 2020-11-01 DIAGNOSIS — G4733 Obstructive sleep apnea (adult) (pediatric): Secondary | ICD-10-CM | POA: Diagnosis not present

## 2020-11-02 ENCOUNTER — Encounter: Payer: Self-pay | Admitting: Family Medicine

## 2020-11-02 DIAGNOSIS — M79671 Pain in right foot: Secondary | ICD-10-CM | POA: Insufficient documentation

## 2020-11-02 NOTE — Assessment & Plan Note (Signed)
Patient does have severe right heel pain.  No significant increase in swelling from one side to the other.  No calf pain noted.  Patient is a history states that he gets worse with activity that goes against plantar fascia.  Patient did have x-rays today which were independently visualized by me showing the patient does have a heel spur.  Questionable that this could be contributing.  I do believe that patient could have more of a heel contusion CAM Walker given today.  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  Patient knows to avoid being barefoot.  Discussed icing regimen.  Patient is already taking Cymbalta, gabapentin as well.  Patient will follow up again in 2 to 4 weeks

## 2020-11-05 ENCOUNTER — Telehealth: Payer: Self-pay

## 2020-11-06 ENCOUNTER — Other Ambulatory Visit: Payer: Self-pay | Admitting: Internal Medicine

## 2020-11-06 DIAGNOSIS — I10 Essential (primary) hypertension: Secondary | ICD-10-CM

## 2020-11-06 DIAGNOSIS — E118 Type 2 diabetes mellitus with unspecified complications: Secondary | ICD-10-CM

## 2020-11-08 ENCOUNTER — Other Ambulatory Visit: Payer: Self-pay | Admitting: Internal Medicine

## 2020-11-08 DIAGNOSIS — E785 Hyperlipidemia, unspecified: Secondary | ICD-10-CM

## 2020-11-12 NOTE — Telephone Encounter (Signed)
Spoke with patient per Dr. Thompson Caul recommendations.

## 2020-11-14 ENCOUNTER — Other Ambulatory Visit: Payer: Self-pay | Admitting: Internal Medicine

## 2020-11-14 DIAGNOSIS — I10 Essential (primary) hypertension: Secondary | ICD-10-CM

## 2020-11-21 ENCOUNTER — Other Ambulatory Visit: Payer: Self-pay | Admitting: Internal Medicine

## 2020-11-21 DIAGNOSIS — K2100 Gastro-esophageal reflux disease with esophagitis, without bleeding: Secondary | ICD-10-CM

## 2020-12-02 ENCOUNTER — Other Ambulatory Visit: Payer: Self-pay

## 2020-12-02 ENCOUNTER — Ambulatory Visit: Payer: HMO | Admitting: Pharmacist

## 2020-12-02 DIAGNOSIS — J4520 Mild intermittent asthma, uncomplicated: Secondary | ICD-10-CM

## 2020-12-02 DIAGNOSIS — I10 Essential (primary) hypertension: Secondary | ICD-10-CM

## 2020-12-02 DIAGNOSIS — E785 Hyperlipidemia, unspecified: Secondary | ICD-10-CM

## 2020-12-02 DIAGNOSIS — E118 Type 2 diabetes mellitus with unspecified complications: Secondary | ICD-10-CM

## 2020-12-02 NOTE — Patient Instructions (Signed)
Visit Information  Phone number for Pharmacist: [phone]  Goals Addressed            This Visit's Progress   . Pharmacy Care Plan   On track    CARE PLAN ENTRY (see longitudinal plan of care for additional care plan information)  Current Barriers:  . Chronic Disease Management support, education, and care coordination needs related to Hypertension, Hyperlipidemia, Diabetes, Asthma, and Depression   Hypertension BP Readings from Last 3 Encounters:  10/31/20 118/82  10/29/20 127/72  10/01/20 132/80 .  Pharmacist Clinical Goal(s): o Over the next 180 days, patient will work with PharmD and providers to maintain BP goal <130/80 . Current regimen:  o Irbesartan 300 mg - 1/2 tablet daily o Chlorthalidone 25 mg daily o Carvedilol 25 mg twice a day . Interventions: o Discussed BP goals and benefits of medications for prevention of heart attack / stroke . Patient self care activities - Over the next  180 days, patient will: o Check BP daily, document, and provide at future appointments o Ensure daily salt intake < 2300 mg/day  Hyperlipidemia Lab Results  Component Value Date/Time   LDLCALC 43 03/28/2014 03:11 PM   LDLDIRECT 57.0 02/21/2020 02:22 PM .  Pharmacist Clinical Goal(s): o Over the next 180 days, patient will work with PharmD and providers to maintain LDL goal < 100 . Current regimen:  o Atorvastatin 20 mg daily o Aspirin 81 mg daily . Interventions: o Discussed cholesterol goals and benefits of medications for prevention of heart attack / stroke . Patient self care activities - Over the next 180 days, patient will: o Continue medication as prescribed  Diabetes Lab Results  Component Value Date/Time   HGBA1C 6.9 (A) 10/01/2020 11:39 AM   HGBA1C 7.4 (H) 05/27/2020 01:59 PM   HGBA1C 7.3 (H) 02/21/2020 02:22 PM .  Pharmacist Clinical Goal(s): o Over the next 180 days, patient will work with PharmD and providers to achieve A1c goal <8% . Current regimen:   o Trulicity 1.5 mg weekly (via PAP) o Synjardy 12.5-500 mg twice a day (via PAP) . Interventions: o Discussed blood sugar goals and benefits of medications for prevention of diabetic complications o Renew patient assistance . Patient self care activities - Over the next 180 days, patient will: o Check blood sugar once daily and in the morning before eating or drinking, document, and provide at future appointments o Contact provider with any episodes of hypoglycemia o Complete patient assistance renewal applications  Asthma . Pharmacist Clinical Goal(s) o Over the next 180 days, patient will work with PharmD and providers to optimize therapy . Current regimen:  o Symbicort HFA 80-4.5 mcg/act 2 puffs as needed o Albuterol nebulized PRN . Interventions: o Renew Symbicort patient assistance . Patient self care activities - Over the next 180 days, patient will: o Carry inhaler at all times  Depression . Pharmacist Clinical Goal(s) o Over the next 180 days, patient will work with PharmD and providers to optimize therapy . Current regimen:  o Duloxetine 60 mg . Interventions: o Discussed benefits of duloxetine for depression and chronic pain . Patient self care activities - Over the next 180 days, patient will: o Continue current medications  Medication management . Pharmacist Clinical Goal(s): o Over the next 180 days, patient will work with PharmD and providers to maintain optimal medication adherence . Current pharmacy: [facility] . Interventions o Comprehensive medication review performed. o Continue current medication management strategy . Patient self care activities - Over the next  180 days, patient will: o Focus on medication adherence by pill box o Take medications as prescribed o Report any questions or concerns to PharmD and/or provider(s)  Please see past updates related to this goal by clicking on the "Past Updates" button in the selected goal        The patient  verbalized understanding of instructions, educational materials, and care plan provided today and declined offer to receive copy of patient instructions, educational materials, and care plan.  Telephone follow up appointment with pharmacy team member scheduled for: 6 months  Charlene Brooke, PharmD, [facility] Clinical Pharmacist [facility] [phone]

## 2020-12-02 NOTE — Chronic Care Management (AMB) (Signed)
Chronic Care Management Pharmacy  Name: JANNELLY BERGREN  MRN: [id number] DOB: [birth year]   Chief Complaint/ HPI  [name],  74 y.o. , female presents for their Follow-Up CCM visit with the clinical pharmacist via telephone due to COVID-19 Pandemic.  PCP : Janith Lima, MD Patient Care Team: Janith Lima, MD as PCP - General Gwenlyn Found Pearletha Forge, MD as PCP - Cardiology (Cardiology) Dohmeier, Asencion Partridge, MD as Consulting Physician (Neurology) Heath Lark, MD as Consulting Physician (Hematology and Oncology) Luretha Rued, RN as [facility] Charlton Haws, [facility] as Pharmacist (Pharmacist)  Their chronic conditions include: Hypertension, Hyperlipidemia, Diabetes, GERD, Asthma, Hypothyroidism, Depression, Anxiety, Osteoarthritis and Overactive Bladder, Fatty liver disease, OSA on BiPAP  Born and raised in [place], immediate family here; extended family in [place]; worked in Tourist information centre manager; 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 Her husband passed earlier this year and it has been very difficult for her and her disabled son she cares for.  Office Visits: 07/01/20 Dr Ronnald Ramp OV: chronic f/u, worsening depression. Start duloxetine 30 mg. Stop B12 and thiamine. Changed Vitamin D from 50K to 2000 IU.  05/27/20 Dr Ronnald Ramp OV: new chest pain, r/o anginal cause, likely GI. Rec'd to increase omeprazole to BID and add famotidine. Also referred to GI.  03/27/20 Dr Ronnald Ramp OV: c/o frequent urination, nocturia, wt gain. Started Myrbetriq 25 mg and Trulicity 6.60 mg. Refilled Percocet and referred to sports med.  Consult Visit: 10/31/20 Dr Tamala Julian (sports med): R heel pain - heel spur vs contusion, CAM walker given. 10/29/20 Dr Gwenlyn Found (cardiology): occasional palpitations, discussed possibly adding diltiazem, pt declined.  08/01/20 PA Almyra Deforest (cardiology): c/o occaional palpitations, rec continue to monitor.  07/02/20 Dr Tamala Julian (sports med): Xray of knee - degenerative changes. Given  Pennsaid.  07/02/20 PA Golden Circle (GI): f/u for GERD, bloating, dysphagia. Stop omeprazole, start pantoprazole 40 mg BID. Schedule EGD w/ dilation after records obtained from previous EGD  04/30/20 Dr Gwenlyn Found (cardiology): LDL goal < 100. Palpitations w/ recent event monitor showing PVCs, short atrial runs. Rec'd abstain from caffeine.  Allergies  Allergen Reactions  . Food Swelling    bananas  . Penicillins Swelling and Rash    Medications: Outpatient Encounter Medications as of 12/02/2020  Medication Sig Note  . albuterol (PROVENTIL) (2.5 MG/3ML) 0.083% nebulizer solution    . albuterol (VENTOLIN HFA) 108 (90 Base) MCG/ACT inhaler Inhale 2 puffs into the lungs every 6 (six) hours as needed. For shortness of breath   . aspirin 81 MG tablet Take 81 mg by mouth daily.    [name] atorvastatin (LIPITOR) 20 MG tablet TAKE 1 TABLET BY MOUTH EVERY DAY   . budesonide-formoterol (SYMBICORT) 80-4.5 MCG/ACT inhaler Inhale 2 puffs into the lungs as needed.  10/03/2020: AZ&Me PAP  . carvedilol (COREG) 25 MG tablet TAKE 1 TABLET (25 MG TOTAL) BY MOUTH 2 (TWO) TIMES DAILY WITH A MEAL.   . chlorthalidone (HYGROTON) 25 MG tablet TAKE 1 TABLET BY MOUTH EVERY DAY   . Cholecalciferol 50 MCG (2000 UT) TABS Take 1 tablet (2,000 Units total) by mouth daily.   . clobetasol ointment (TEMOVATE) 6.30 % Apply 1 application topically 2 (two) times daily.   . Dulaglutide (TRULICITY) 1.5 ZS/0.1UX SOPN Inject 0.5 mLs (1.5 mg total) into the skin once a week. 10/03/2020: Lilly Cares PAP  . DULoxetine (CYMBALTA) 60 MG capsule Take 1 capsule (60 mg total) by mouth daily.   . Empagliflozin-metFORMIN HCl (SYNJARDY) 12.5-500 MG TABS Take 1  tablet by mouth 2 (two) times daily. 10/03/2020: BI Cares PAP  . famotidine (PEPCID) 40 MG tablet TAKE 1 TABLET BY MOUTH EVERYDAY AT BEDTIME   . ferrous sulfate 325 (65 FE) MG tablet TAKE 1 TABLET BY MOUTH TWICE DAILY WITH A MEAL   . gabapentin (NEURONTIN) 300 MG capsule TAKE 1 CAPSULE BY MOUTH  THREE TIMES DAILY   . glucose blood (FREESTYLE TEST STRIPS) test strip USE TO TEST BLOOD SUGAR 3 TIMES A DAY. DX: E11.8   . Insulin Pen Needle 31G X 5 MM MISC Use once daily with insulin   . irbesartan (AVAPRO) 300 MG tablet Take 0.5 tablets (150 mg total) by mouth daily. 08/06/2020: Patient takes when BP > 150/90  . latanoprost (XALATAN) 0.005 % ophthalmic solution Place 1 drop into both eyes at bedtime.    [name] MYRBETRIQ 25 MG TB24 tablet TAKE 1 TABLET BY MOUTH EVERY DAY   . nystatin ointment (MYCOSTATIN) APPLY ON THE SKIN THREE TIMES DAILY   . oxyCODONE-acetaminophen (PERCOCET/ROXICET) 5-325 MG tablet Take 1 tablet by mouth every 8 (eight) hours as needed for severe pain.   . pantoprazole (PROTONIX) 40 MG tablet TAKE 1 TABLET (40 MG TOTAL) BY MOUTH 2 (TWO) TIMES DAILY BEFORE A MEAL.   [name] potassium chloride SA (KLOR-CON M20) 20 MEQ tablet TAKE 1 TABLET BY MOUTH TWICE A DAY   . [DISCONTINUED] atorvastatin (LIPITOR) 20 MG tablet TAKE 1 TABLET BY MOUTH EVERY DAY   . [DISCONTINUED] carvedilol (COREG) 25 MG tablet Take 1 tablet (25 mg total) by mouth 2 (two) times daily with a meal.   . [DISCONTINUED] chlorthalidone (HYGROTON) 25 MG tablet Take 1 tablet (25 mg total) by mouth daily.   . [DISCONTINUED] chlorthalidone (HYGROTON) 25 MG tablet TAKE 1 TABLET BY MOUTH EVERY DAY   . [DISCONTINUED] glucose blood (FREESTYLE TEST STRIPS) test strip Use to test blood sugar 3 times a day. DX: E11.8    No facility-administered encounter medications on file as of 12/02/2020.   Wt Readings from Last 3 Encounters:  10/31/20 249 lb (112.9 kg)  10/29/20 249 lb 3.2 oz (113 kg)  10/01/20 249 lb (112.9 kg)   Lab Results  Component Value Date   CREATININE 0.91 05/27/2020   BUN 20 05/27/2020   GFR 73.48 05/27/2020   GFRNONAA >60 08/29/2015   GFRAA >60 08/29/2015   NA 137 05/27/2020   K 3.5 05/27/2020   CALCIUM 10.1 05/27/2020   CO2 27 05/27/2020   Current Diagnosis/Assessment:    Goals Addressed             This Visit's Progress   . Pharmacy Care Plan   On track    CARE PLAN ENTRY (see longitudinal plan of care for additional care plan information)  Current Barriers:  . Chronic Disease Management support, education, and care coordination needs related to Hypertension, Hyperlipidemia, Diabetes, Asthma, and Depression   Hypertension BP Readings from Last 3 Encounters:  10/31/20 118/82  10/29/20 127/72  10/01/20 132/80 .  Pharmacist Clinical Goal(s): o Over the next 180 days, patient will work with PharmD and providers to maintain BP goal <130/80 . Current regimen:  o Irbesartan 300 mg - 1/2 tablet daily o Chlorthalidone 25 mg daily o Carvedilol 25 mg twice a day . Interventions: o Discussed BP goals and benefits of medications for prevention of heart attack / stroke . Patient self care activities - Over the next  180 days, patient will: o Check BP daily, document, and provide at future appointments  o Ensure daily salt intake < 2300 mg/day  Hyperlipidemia Lab Results  Component Value Date/Time   LDLCALC 43 03/28/2014 03:11 PM   LDLDIRECT 57.0 02/21/2020 02:22 PM .  Pharmacist Clinical Goal(s): o Over the next 180 days, patient will work with PharmD and providers to maintain LDL goal < 100 . Current regimen:  o Atorvastatin 20 mg daily o Aspirin 81 mg daily . Interventions: o Discussed cholesterol goals and benefits of medications for prevention of heart attack / stroke . Patient self care activities - Over the next 180 days, patient will: o Continue medication as prescribed  Diabetes Lab Results  Component Value Date/Time   HGBA1C 6.9 (A) 10/01/2020 11:39 AM   HGBA1C 7.4 (H) 05/27/2020 01:59 PM   HGBA1C 7.3 (H) 02/21/2020 02:22 PM .  Pharmacist Clinical Goal(s): o Over the next 180 days, patient will work with PharmD and providers to achieve A1c goal <8% . Current regimen:  o Trulicity 1.5 mg weekly (via PAP) o Synjardy 12.5-500 mg twice a day (via  PAP) . Interventions: o Discussed blood sugar goals and benefits of medications for prevention of diabetic complications o Renew patient assistance . Patient self care activities - Over the next 180 days, patient will: o Check blood sugar once daily and in the morning before eating or drinking, document, and provide at future appointments o Contact provider with any episodes of hypoglycemia o Complete patient assistance renewal applications  Asthma . Pharmacist Clinical Goal(s) o Over the next 180 days, patient will work with PharmD and providers to optimize therapy . Current regimen:  o Symbicort HFA 80-4.5 mcg/act 2 puffs as needed o Albuterol nebulized PRN . Interventions: o Renew Symbicort patient assistance . Patient self care activities - Over the next 180 days, patient will: o Carry inhaler at all times  Depression . Pharmacist Clinical Goal(s) o Over the next 180 days, patient will work with PharmD and providers to optimize therapy . Current regimen:  o Duloxetine 60 mg . Interventions: o Discussed benefits of duloxetine for depression and chronic pain . Patient self care activities - Over the next 180 days, patient will: o Continue current medications  Medication management . Pharmacist Clinical Goal(s): o Over the next 180 days, patient will work with PharmD and providers to maintain optimal medication adherence . Current [facility] . Interventions o Comprehensive medication review performed. o Continue current medication management strategy . Patient self care activities - Over the next 180 days, patient will: o Focus on medication adherence by pill box o Take medications as prescribed o Report any questions or concerns to PharmD and/or provider(s)  Please see past updates related to this goal by clicking on the "Past Updates" button in the selected goal       Hypertension   BP goal is:  <130/80  Office blood pressures are  BP Readings from Last 3  Encounters:  10/31/20 118/82  10/29/20 127/72  10/01/20 132/80   Patient checks BP at home daily Patient home BP readings are ranging:   Patient has failed these meds in the past: n/a Patient is currently controlled on the following medications:  . Irbesartan 300 mg - 1/2 tablet daily (pt takes when BP > 160/100) . Chlorthalidone 25 mg daily . Carvedilol 25 mg BID   We discussed diet and exercise extensively; pt reports she typically takes irbesartan 1-3 days per week based on what her BP is running; she says when she took 300 mg daily her BP dropped to 80s/40s.  Discussed BP goal < 130/80; discussed benefits of ARB for kidney protection  Plan  Continue current medications and control with diet and exercise    Hyperlipidemia   LDL goal < 100 Significant family history of MI, no personal hx of MI or stroke  Lipid Panel     Component Value Date/Time   CHOL 144 02/21/2020 1422   TRIG 272.0 (H) 02/21/2020 1422   HDL 47.30 02/21/2020 1422   LDLCALC 43 03/28/2014 1511   LDLDIRECT 57.0 02/21/2020 1422    Hepatic Function Latest Ref Rng & Units 02/21/2020 09/13/2019 01/04/2019  Total Protein 6.0 - 8.3 g/dL 7.7 7.9 7.8  Albumin 3.5 - 5.2 g/dL 4.2 4.5 4.3  AST 0 - 37 U/L _0 ALT 0 - 35 U/L _1 Alk Phosphatase 39 - 117 U/L 95 90 93  Total Bilirubin 0.2 - 1.2 mg/dL 0.6 0.5 0.5  Bilirubin, Direct 0.0 - 0.3 mg/dL 0.0 0.1 -    10-year ASCVD risk: <19.2% (calculated with LDL of 70, pt's LDL is actually 57)  Patient has failed these meds in past: n/a Patient is currently controlled on the following medications:  . Atorvastatin 20 mg daily HS . Aspirin 81 mg daily  We discussed:  diet and exercise extensively; benefits of statin for ASCVD risk reduction  Plan  Continue current medications  Diabetes   A1c goal <7%  Recent Relevant Labs: Lab Results  Component Value Date/Time   HGBA1C 6.9 (A) 10/01/2020 11:39 AM   HGBA1C 7.4 (H) 05/27/2020 01:59 PM   HGBA1C 7.3  (H) 02/21/2020 02:22 PM   GFR 73.48 05/27/2020 01:59 PM   GFR 81.75 03/27/2020 04:22 PM   MICROALBUR 0.5 07/01/2020 11:43 AM   MICROALBUR 0.8 05/04/2019 12:12 PM    Last diabetic Eye exam:  Lab Results  Component Value Date/Time   HMDIABEYEEXA No Retinopathy 06/26/2019 12:00 AM    Last diabetic Foot exam:  Lab Results  Component Value Date/Time   HMDIABFOOTEX done 06/20/2018 12:00 AM    Checking BG: Daily  Recent FBG Readings: 150  Patient has failed these meds in past: n/a Patient is currently controlled on the following medications: . Trulicity 1.5 mg weekly Friday . Synjardy 12.5-500 mg BID (via PAP)  We discussed: BG goals; benefits of GLP-1 and SGLT-2 for heart/kidneys. Pt denies issues with medications; she now receives DM meds for free through patient assistance - it time to renew for 2022  Plan  Continue current medications and control with diet and exercise  Renew patient assistance  Asthma   Last spirometry score: n/a  Lab Results  Component Value Date/Time   EOSPCT 3.3 07/01/2020 11:43 AM   EOSABS 343 07/01/2020 11:43 AM   Patient has failed these meds in past: [place] Patient is currently controlled on the following medications:  [name] Symbicort HFA 80-4.5 mcg/act 2 puffs PRn . Albuterol nebulized PRN  Using maintenance inhaler regularly? No maintenance inhaler Frequency of rescue inhaler use:  1-2x per week  We discussed:  proper inhaler technique; benefits of ICS/LABA as needed based on asthma guidelines; pt denies issues  Plan  Renew patient assistance  Medication Management   [facility] Uses pill box? Yes Pt endorses 100% compliance   We discussed: Pt is in process of [facility], she is satisfied with their services.  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

## 2020-12-04 ENCOUNTER — Other Ambulatory Visit: Payer: Self-pay

## 2020-12-04 ENCOUNTER — Ambulatory Visit: Payer: Self-pay

## 2020-12-04 ENCOUNTER — Ambulatory Visit: Payer: HMO | Admitting: Family Medicine

## 2020-12-04 ENCOUNTER — Encounter: Payer: Self-pay | Admitting: Family Medicine

## 2020-12-04 VITALS — BP 116/82 | HR 78 | Ht 66.0 in | Wt 243.0 lb

## 2020-12-04 DIAGNOSIS — M79671 Pain in right foot: Secondary | ICD-10-CM | POA: Diagnosis not present

## 2020-12-04 DIAGNOSIS — G8929 Other chronic pain: Secondary | ICD-10-CM

## 2020-12-04 NOTE — Progress Notes (Signed)
Valerie [name] [place] Phone: [phone] Subjective:    I'm seeing this patient by the request  of:  Janith Lima, MD  CC: bilateral heel pain   [id number]   10/31/2020 Patient does have severe right heel pain.  No significant increase in swelling from one side to the other.  No calf pain noted.  Patient is a history states that he gets worse with activity that goes against plantar fascia.  Patient did have x-rays today which were independently visualized by me showing the patient does have a heel spur.  Questionable that this could be contributing.  I do believe that patient could have more of a heel contusion CAM Walker given today.  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  Patient knows to avoid being barefoot.  Discussed icing regimen.  Patient is already taking Cymbalta, gabapentin as well.  Patient will follow up again in 2 to 4 weeks  Update 12/04/2020 Valerie [name] is a 74 y.o. female coming in with complaint of right heel pain. Does feel she is improving but not 100%. Does wear boot around the house which has helped.  Patient states that she is 30 to 40% better.  Patient states that if she does walk on her heel barefoot she has more pain.  Unable to fully wear a shoe yet either. Patient did have x-rays at last exam.  Patient's x-rays show that patient did have some very mild degenerative changes of the hindfoot but otherwise unremarkable   Past Medical History:  Diagnosis Date  . Anemia   . Anxiety and depression   . Arthritis   . Asthma   . Cataract   . Degenerative arthritis   . Depression   . Diabetes mellitus, type 2 ([place])   . Fatigue   . GERD (gastroesophageal reflux disease)   . Glaucoma   . Heart palpitations   . Hemorrhoids   . Hyperlipidemia   . Hypertension   . Hypothyroidism   . Hypoxemia 11/24/2013  . Memory deficit 10/04/2013  .  Obesity   . Sleep apnea    BiPAP  . Snoring disorder    Past Surgical History:  Procedure Laterality Date  . benign tumors resected    . CATARACT EXTRACTION Left   . FOOT SURGERY Left    bone spur  . REFRACTIVE SURGERY     Glaucoma  . ROTATOR CUFF REPAIR Left   . TUBAL LIGATION     Social History   Socioeconomic History  . Marital status: Widowed    Spouse name: Not on file  . Number of children: 3  . Years of education: 11th  . Highest education level: 11th grade  Occupational History  . Occupation: disabled  Tobacco Use  . Smoking status: Never Smoker  . Smokeless tobacco: Never Used  Vaping Use  . Vaping Use: Never used  Substance and Sexual Activity  . Alcohol use: No    Alcohol/week: 0.0 standard drinks  . Drug use: No  . Sexual activity: Not Currently    Comment: not working, lives with husband, 3 sons  Other Topics Concern  . Not on file  Social History Narrative   Client's spouse recently passed Feb 2021    also caregiver for son who is 33 yo and is disabled.   Social Determinants of Health   Financial Resource Strain: High Risk  . Difficulty of Paying  Living Expenses: Hard  Food Insecurity: No Food Insecurity  . Worried About Charity fundraiser in the Last Year: Never true  . Ran Out of Food in the Last Year: Never true  Transportation Needs: No Transportation Needs  . Lack of Transportation (Medical): No  . Lack of Transportation (Non-Medical): No  Physical Activity: Inactive  . Days of Exercise per Week: 0 days  . Minutes of Exercise per Session: 0 min  Stress: Stress Concern Present  . Feeling of Stress : Rather much  Social Connections: Moderately Integrated  . Frequency of Communication with Friends and Family: More than three times a week  . Frequency of Social Gatherings with Friends and Family: More than three times a week  . Attends Religious Services: More than 4 times per year  . Active Member of Clubs or Organizations: Yes  .  Attends Archivist Meetings: More than 4 times per year  . Marital Status: Widowed   Allergies  Allergen Reactions  . Food Swelling    bananas  . Penicillins Swelling and Rash   Family History  Problem Relation Age of Onset  . Alcohol abuse Mother   . Heart attack Mother   . Coronary artery disease Brother   . Heart attack Brother   . Hypertension Brother   . Hyperlipidemia Brother   . Heart attack Father   . Hypertension Father   . Hyperlipidemia Father   . Heart disease Sister   . Atrial fibrillation Sister   . Hypertension Sister   . Hyperlipidemia Sister   . Hypertension Other        family history  . Alcohol abuse Other   . Colon cancer Brother 61  . Hypertension Brother   . Hyperlipidemia Brother   . Dementia Brother   . Hypertension Brother   . Hyperlipidemia Brother   . Diabetes Son   . Hypertension Son   . Hyperlipidemia Son   . Diabetes Son   . Hypertension Son   . Hyperlipidemia Son   . Cerebral palsy Son   . Hypertension Son   . Hyperlipidemia Son     Current Outpatient Medications (Endocrine & Metabolic):  [name]  Dulaglutide (TRULICITY) 1.5 XQ/1.1HE SOPN, Inject 0.5 mLs (1.5 mg total) into the skin once a week. .  Empagliflozin-metFORMIN HCl (SYNJARDY) 12.5-500 MG TABS, Take 1 tablet by mouth 2 (two) times daily.  Current Outpatient Medications (Cardiovascular):  .  atorvastatin (LIPITOR) 20 MG tablet, TAKE 1 TABLET BY MOUTH EVERY DAY .  carvedilol (COREG) 25 MG tablet, TAKE 1 TABLET (25 MG TOTAL) BY MOUTH 2 (TWO) TIMES DAILY WITH A MEAL. .  chlorthalidone (HYGROTON) 25 MG tablet, TAKE 1 TABLET BY MOUTH EVERY DAY .  irbesartan (AVAPRO) 300 MG tablet, Take 0.5 tablets (150 mg total) by mouth daily.  Current Outpatient Medications (Respiratory):  .  albuterol (PROVENTIL) (2.5 MG/3ML) 0.083% nebulizer solution,  .  albuterol (VENTOLIN HFA) 108 (90 Base) MCG/ACT inhaler, Inhale 2 puffs into the lungs every 6 (six) hours as needed. For shortness  of breath .  budesonide-formoterol (SYMBICORT) 80-4.5 MCG/ACT inhaler, Inhale 2 puffs into the lungs as needed.   Current Outpatient Medications (Analgesics):  .  aspirin 81 MG tablet, Take 81 mg by mouth daily.  [name]  oxyCODONE-acetaminophen (PERCOCET/ROXICET) 5-325 MG tablet, Take 1 tablet by mouth every 8 (eight) hours as needed for severe pain.  Current Outpatient Medications (Hematological):  .  ferrous sulfate 325 (65 FE) MG tablet, TAKE 1 TABLET BY  MOUTH TWICE DAILY WITH A MEAL  Current Outpatient Medications (Other):  [name]  Cholecalciferol 50 MCG (2000 UT) TABS, Take 1 tablet (2,000 Units total) by mouth daily. .  clobetasol ointment (TEMOVATE) [id number] %, Apply 1 application topically 2 (two) times daily. .  DULoxetine (CYMBALTA) 60 MG capsule, Take 1 capsule (60 mg total) by mouth daily. .  famotidine (PEPCID) 40 MG tablet, TAKE 1 TABLET BY MOUTH EVERYDAY AT BEDTIME .  gabapentin (NEURONTIN) 300 MG capsule, TAKE 1 CAPSULE BY MOUTH THREE TIMES DAILY .  glucose blood (FREESTYLE TEST STRIPS) test strip, USE TO TEST BLOOD SUGAR 3 TIMES A DAY. DX: E11.8 .  Insulin Pen Needle 31G X 5 MM MISC, Use once daily with insulin .  latanoprost (XALATAN) 0.005 % ophthalmic solution, Place 1 drop into both eyes at bedtime.  [name]  MYRBETRIQ 25 MG TB24 tablet, TAKE 1 TABLET BY MOUTH EVERY DAY .  nystatin ointment (MYCOSTATIN), APPLY ON THE SKIN THREE TIMES DAILY .  pantoprazole (PROTONIX) 40 MG tablet, TAKE 1 TABLET (40 MG TOTAL) BY MOUTH 2 (TWO) TIMES DAILY BEFORE A MEAL. [name]  potassium chloride SA (KLOR-CON M20) 20 MEQ tablet, TAKE 1 TABLET BY MOUTH TWICE A DAY   Reviewed prior external information including notes and imaging from  primary care provider As well as notes that were available from care everywhere and other healthcare systems.  Past medical history, social, surgical and family history all reviewed in electronic medical record.  No pertanent information unless stated regarding to the chief  complaint.   Review of Systems:  No headache, visual changes, nausea, vomiting, diarrhea, constipation, dizziness, abdominal pain, skin rash, fevers, chills, night sweats, weight loss, swollen lymph nodes, body aches, joint swelling, chest pain, shortness of breath, mood changes. POSITIVE muscle aches  Objective  Blood pressure 116/82, pulse 78, height 5\' 6"  (1.676 m), weight 243 lb (110.2 kg), SpO2 99 %.   General: No apparent distress alert and oriented x3 mood and affect normal, dressed appropriately.  HEENT: Pupils equal, extraocular movements intact  Respiratory: Patient's speak in full sentences and does not appear short of breath  Cardiovascular: Trace lower extremity edema, non tender, no erythema  Gait antalgic MSK: Right heel exam shows the patient does have significant thickening of the skin.  Still severely tender on the plantar aspect.  Patient does have some mild pain of the plantar fascia especially with stretching.  No pain over the Achilles at the moment.  Limited musculoskeletal ultrasound was performed and interpreted by Lyndal Pulley  Limited ultrasound shows the patient does have mild hypoechoic changes of the plantar fascia at the origin at the calcaneal region.  No true cortical irregularity noted.  Difficult to assess secondary to the thickness of the skin in the surrounding area    Impression and Recommendations:     The above documentation has been reviewed and is accurate and complete Lyndal Pulley, DO

## 2020-12-04 NOTE — Patient Instructions (Signed)
Good to see you Exercises plantar fascia Thick heel lifts Try lotion after shower to break down skin Lets give it another 5-6 weeks if worsening pain call us

## 2020-12-04 NOTE — Assessment & Plan Note (Signed)
Patient's x-rays that show a spur.  Attempted ultrasound today but was unable to see much.  Questionable thickening of the plantar fascia that would go with the plantar fasciitis versus potential tear as well with minimal pain.  We will try a felt heel pad to see if this will be beneficial.  Discussed icing regimen and home exercises.  Patient given exercises and if worsening may need to consider formal physical therapy.  Can also consider advanced imaging if necessary.  Patient is in agreement with the plan and will follow up again in 5 to 6 weeks

## 2020-12-18 ENCOUNTER — Other Ambulatory Visit: Payer: Self-pay

## 2020-12-27 ENCOUNTER — Other Ambulatory Visit: Payer: Self-pay | Admitting: Internal Medicine

## 2020-12-27 DIAGNOSIS — D509 Iron deficiency anemia, unspecified: Secondary | ICD-10-CM

## 2021-01-08 ENCOUNTER — Ambulatory Visit: Payer: HMO | Admitting: Family Medicine

## 2021-01-08 ENCOUNTER — Encounter: Payer: Self-pay | Admitting: Family Medicine

## 2021-01-08 ENCOUNTER — Other Ambulatory Visit: Payer: Self-pay

## 2021-01-08 VITALS — BP 118/84 | HR 86 | Ht 66.0 in | Wt 242.0 lb

## 2021-01-08 DIAGNOSIS — M25571 Pain in right ankle and joints of right foot: Secondary | ICD-10-CM | POA: Diagnosis not present

## 2021-01-08 DIAGNOSIS — M7731 Calcaneal spur, right foot: Secondary | ICD-10-CM

## 2021-01-08 DIAGNOSIS — G8929 Other chronic pain: Secondary | ICD-10-CM

## 2021-01-08 NOTE — Progress Notes (Signed)
Valerie [name] Phone: [phone] Subjective:   Valerie [name], am serving as a scribe for Dr. Hulan Saas. This visit occurred during the SARS-CoV-2 public health emergency.  Safety protocols were in place, including screening questions prior to the visit, additional usage of staff PPE, and extensive cleaning of exam room while observing appropriate contact time as indicated for disinfecting solutions.   I'm seeing this patient by the request  of:  Janith Lima, MD  CC: Heel pain follow-up  HQI:[id number]   12/04/2020 Patient's x-rays that show a spur.  Attempted ultrasound today but was unable to see much.  Questionable thickening of the plantar fascia that would go with the plantar fasciitis versus potential tear as well with minimal pain.  We will try a felt heel pad to see if this will be beneficial.  Discussed icing regimen and home exercises.  Patient given exercises and if worsening may need to consider formal physical therapy.  Can also consider advanced imaging if necessary.  Patient is in agreement with the plan and will follow up again in 5 to 6 weeks  Update 01/08/2021 Valerie [name] is a 75 y.o. female coming in with complaint of right heel pain. Patient states that her heel pain is somewhat better but still has sharp pain at base of calcaneous. Typically wears cam walker for relief. Has heel lifts in tennis shoes. Using Arnica and Tylenol for pain relief.  Patient does state it is making improvement but is still not anywhere near her baseline.  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       Past Medical History:  Diagnosis Date  . Anemia   . Anxiety and depression   . Arthritis   . Asthma   . Cataract   . Degenerative arthritis   . Depression   . Diabetes mellitus, type 2 ([place])   . Fatigue    . GERD (gastroesophageal reflux disease)   . Glaucoma   . Heart palpitations   . Hemorrhoids   . Hyperlipidemia   . Hypertension   . Hypothyroidism   . Hypoxemia 11/24/2013  . Memory deficit 10/04/2013  . Obesity   . Sleep apnea    BiPAP  . Snoring disorder    Past Surgical History:  Procedure Laterality Date  . benign tumors resected    . CATARACT EXTRACTION Left   . FOOT SURGERY Left    bone spur  . REFRACTIVE SURGERY     Glaucoma  . ROTATOR CUFF REPAIR Left   . TUBAL LIGATION     Social History   Socioeconomic History  . Marital status: Widowed    Spouse name: Not on file  . Number of children: 3  . Years of education: 11th  . Highest education level: 11th grade  Occupational History  . Occupation: disabled  Tobacco Use  . Smoking status: Never Smoker  . Smokeless tobacco: Never Used  Vaping Use  . Vaping Use: Never used  Substance and Sexual Activity  . Alcohol use: [name]    Alcohol/week: 0.0 standard drinks  . Drug use: [name]  . Sexual activity: Not Currently    Comment: not working, lives with husband, 3 sons  Other Topics Concern  . Not on file  Social History Narrative   Client's spouse recently passed Feb 2021  also caregiver for son who is 61 yo and is disabled.   Social Determinants of Health   Financial Resource Strain: High Risk  . Difficulty of Paying Living Expenses: Hard  Food Insecurity: [name] Food Insecurity  . Worried About Charity fundraiser in the Last Year: Never true  . Ran Out of Food in the Last Year: Never true  Transportation Needs: [name] Transportation Needs  . Lack of Transportation (Medical): [name]  . Lack of Transportation (Non-Medical): [name]  Physical Activity: Inactive  . Days of Exercise per Week: 0 days  . Minutes of Exercise per Session: 0 min  Stress: Stress Concern Present  . Feeling of Stress : Rather much  Social Connections: Moderately Integrated  . Frequency of Communication with Friends and Family: More than three  times a week  . Frequency of Social Gatherings with Friends and Family: More than three times a week  . Attends Religious Services: More than 4 times per year  . Active Member of Clubs or Organizations: Yes  . Attends Archivist Meetings: More than 4 times per year  . Marital Status: Widowed   Allergies  Allergen Reactions  . Food Swelling    bananas  . Penicillins Swelling and Rash   Family History  Problem Relation Age of Onset  . Alcohol abuse Mother   . Heart attack Mother   . Coronary artery disease Brother   . Heart attack Brother   . Hypertension Brother   . Hyperlipidemia Brother   . Heart attack Father   . Hypertension Father   . Hyperlipidemia Father   . Heart disease Sister   . Atrial fibrillation Sister   . Hypertension Sister   . Hyperlipidemia Sister   . Hypertension Other        family history  . Alcohol abuse Other   . Colon cancer Brother 17  . Hypertension Brother   . Hyperlipidemia Brother   . Dementia Brother   . Hypertension Brother   . Hyperlipidemia Brother   . Diabetes Son   . Hypertension Son   . Hyperlipidemia Son   . Diabetes Son   . Hypertension Son   . Hyperlipidemia Son   . Cerebral palsy Son   . Hypertension Son   . Hyperlipidemia Son     Current Outpatient Medications (Endocrine & Metabolic):  [name]  Dulaglutide (TRULICITY) 1.5 TO/6.7TI SOPN, Inject 0.5 mLs (1.5 mg total) into the skin once a week. .  Empagliflozin-metFORMIN HCl (SYNJARDY) 12.5-500 MG TABS, Take 1 tablet by mouth 2 (two) times daily.  Current Outpatient Medications (Cardiovascular):  .  atorvastatin (LIPITOR) 20 MG tablet, TAKE 1 TABLET BY MOUTH EVERY DAY .  carvedilol (COREG) 25 MG tablet, TAKE 1 TABLET (25 MG TOTAL) BY MOUTH 2 (TWO) TIMES DAILY WITH A MEAL. .  chlorthalidone (HYGROTON) 25 MG tablet, TAKE 1 TABLET BY MOUTH EVERY DAY .  irbesartan (AVAPRO) 300 MG tablet, Take 0.5 tablets (150 mg total) by mouth daily.  Current Outpatient Medications  (Respiratory):  .  albuterol (PROVENTIL) (2.5 MG/3ML) 0.083% nebulizer solution,  .  albuterol (VENTOLIN HFA) 108 (90 Base) MCG/ACT inhaler, Inhale 2 puffs into the lungs every 6 (six) hours as needed. For shortness of breath .  budesonide-formoterol (SYMBICORT) 80-4.5 MCG/ACT inhaler, Inhale 2 puffs into the lungs as needed.   Current Outpatient Medications (Analgesics):  .  aspirin 81 MG tablet, Take 81 mg by mouth daily.  [name]  oxyCODONE-acetaminophen (PERCOCET/ROXICET) 5-325 MG tablet, Take 1 tablet by  mouth every 8 (eight) hours as needed for severe pain.  Current Outpatient Medications (Hematological):  .  ferrous sulfate 325 (65 FE) MG tablet, TAKE 1 TABLET BY MOUTH TWICE DAILY WITH A MEAL  Current Outpatient Medications (Other):  [name]  Cholecalciferol 50 MCG (2000 UT) TABS, Take 1 tablet (2,000 Units total) by mouth daily. .  clobetasol ointment (TEMOVATE) [id number] %, Apply 1 application topically 2 (two) times daily. .  DULoxetine (CYMBALTA) 60 MG capsule, Take 1 capsule (60 mg total) by mouth daily. .  famotidine (PEPCID) 40 MG tablet, TAKE 1 TABLET BY MOUTH EVERYDAY AT BEDTIME .  gabapentin (NEURONTIN) 300 MG capsule, TAKE 1 CAPSULE BY MOUTH THREE TIMES DAILY .  glucose blood (FREESTYLE TEST STRIPS) test strip, USE TO TEST BLOOD SUGAR 3 TIMES A DAY. DX: E11.8 .  Insulin Pen Needle 31G X 5 MM MISC, Use once daily with insulin .  latanoprost (XALATAN) 0.005 % ophthalmic solution, Place 1 drop into both eyes at bedtime.  [name]  MYRBETRIQ 25 MG TB24 tablet, TAKE 1 TABLET BY MOUTH EVERY DAY .  nystatin ointment (MYCOSTATIN), APPLY ON THE SKIN THREE TIMES DAILY .  pantoprazole (PROTONIX) 40 MG tablet, TAKE 1 TABLET (40 MG TOTAL) BY MOUTH 2 (TWO) TIMES DAILY BEFORE A MEAL. [name]  potassium chloride SA (KLOR-CON M20) 20 MEQ tablet, TAKE 1 TABLET BY MOUTH TWICE A DAY   Reviewed prior external information including notes and imaging from  primary care provider As well as notes that were available from  care everywhere and other healthcare systems.  Past medical history, social, surgical and family history all reviewed in electronic medical record.  [name] pertanent information unless stated regarding to the chief complaint.   Review of Systems:  [name] headache, visual changes, nausea, vomiting, diarrhea, constipation, dizziness, abdominal pain, skin rash, fevers, chills, night sweats, weight loss, swollen lymph nodes, body aches, joint swelling, chest pain, shortness of breath, mood changes. POSITIVE muscle aches  Objective  Blood pressure 118/84, pulse 86, height 5\' 6"  (1.676 m), weight 242 lb (109.8 kg), SpO2 99 %.   General: [name] apparent distress alert and oriented x3 mood and affect normal, dressed appropriately.  HEENT: Pupils equal, extraocular movements intact  Respiratory: Patient's speak in full sentences and does not appear short of breath  Cardiovascular: Trace lower extremity edema, non tender, [name] erythema  Gait antalgic favoring the right side MSK: Patient's right foot shows that patient does still have kind of a hardness of the heel noted on the plantar aspect.  Severely tender to palpation in this area.  Patient does have some mild tightness of the posterior cord noted.  Mild positive Tinel's which is a different finding than previously.  Patient states that this does seem to hurt her ankle a little bit more.  [name] instability of the ankle itself noted but does have some mild arthritic changes.     Impression and Recommendations:     The above documentation has been reviewed and is accurate and complete Lyndal Pulley, DO

## 2021-01-08 NOTE — Patient Instructions (Addendum)
Let's get MRI of ankle- [facility] [phone] After MRI, I will contact you and talk about next steps

## 2021-01-08 NOTE — Assessment & Plan Note (Signed)
Patient is now 2 months since the injury to this area and is making progress but very slow.  Due to patient being the primary caregiver for multiple other individuals and having difficulty with walking and some instability I do feel that advanced imaging is warranted.  This could be either secondary to the heel spur itself or it could be a rupture of the plantar fascia.  Depending on findings this could change medical management.  Occult fracture with it as well

## 2021-01-28 ENCOUNTER — Ambulatory Visit
Admission: RE | Admit: 2021-01-28 | Discharge: 2021-01-28 | Disposition: A | Discharge status: Home / Self Care | Payer: HMO | Source: Ambulatory Visit | Attending: Family Medicine | Admitting: Family Medicine

## 2021-01-28 ENCOUNTER — Other Ambulatory Visit: Payer: Self-pay

## 2021-01-28 DIAGNOSIS — M7731 Calcaneal spur, right foot: Secondary | ICD-10-CM | POA: Diagnosis not present

## 2021-01-28 DIAGNOSIS — R600 Localized edema: Secondary | ICD-10-CM | POA: Diagnosis not present

## 2021-01-29 ENCOUNTER — Other Ambulatory Visit: Payer: Self-pay | Admitting: Internal Medicine

## 2021-01-29 DIAGNOSIS — M150 Primary generalized (osteo)arthritis: Secondary | ICD-10-CM

## 2021-01-29 DIAGNOSIS — M159 Polyosteoarthritis, unspecified: Secondary | ICD-10-CM

## 2021-01-29 DIAGNOSIS — F331 Major depressive disorder, recurrent, moderate: Secondary | ICD-10-CM

## 2021-01-29 DIAGNOSIS — M8949 Other hypertrophic osteoarthropathy, multiple sites: Secondary | ICD-10-CM

## 2021-01-30 DIAGNOSIS — G4733 Obstructive sleep apnea (adult) (pediatric): Secondary | ICD-10-CM | POA: Diagnosis not present

## 2021-01-30 DIAGNOSIS — G479 Sleep disorder, unspecified: Secondary | ICD-10-CM | POA: Diagnosis not present

## 2021-02-03 ENCOUNTER — Telehealth: Payer: Self-pay | Admitting: Family Medicine

## 2021-02-03 ENCOUNTER — Ambulatory Visit: Payer: HMO | Admitting: Internal Medicine

## 2021-02-03 NOTE — Telephone Encounter (Signed)
Patient called back in response to her MRI. I gave her Dr Thompson Caul response. She expressed understand and would like to continue with the referral. She said that she is already seen by Dr Rosemary Holms at [facility] and asked if it would be okay to see her for this issue as well?  Please advise.

## 2021-02-04 ENCOUNTER — Other Ambulatory Visit: Payer: Self-pay

## 2021-02-04 DIAGNOSIS — S93691A Other sprain of right foot, initial encounter: Secondary | ICD-10-CM

## 2021-02-04 NOTE — Telephone Encounter (Signed)
That is fine if that is what the patient wants

## 2021-02-04 NOTE — Telephone Encounter (Signed)
Spoke with patient. She has appointment set up on Monday.

## 2021-02-05 ENCOUNTER — Telehealth: Payer: Self-pay | Admitting: Pharmacist

## 2021-02-05 NOTE — Progress Notes (Signed)
A call was made to [employer] to check on the status of patient assistant application for Trulicity. Spoke with representative Rip Harbour) who said that the application was received on 01/29/21. The rep went ahead and processed the application over the phone. App has been approved through 12/13/21. The app was sent to the pharmacy which may take 24 to 48 hours to receive. The pharmacy has 14 days to process the application. An approval letter will be sent to the patient and faxed to Dr. Ronnald Ramp office.   Wendy Poet, Clinical Pharmacist Assistant [facility] [phone]   Time spent:20

## 2021-02-05 NOTE — Progress Notes (Signed)
  Call was made to [employer] to check on the status of patient assistant application for Symbicort. Spoke with representative Alvie Heidelberg who processed the application over the phone. Application was approved through 12/13/21. Approval letter to mailed to patient and faxed to Dr. Ronnald Ramp office. Refills on auto ship and will be shipped every 90 days to patient address.   Wendy Poet, [place] [phone]   Time spent:30

## 2021-02-06 ENCOUNTER — Ambulatory Visit (INDEPENDENT_AMBULATORY_CARE_PROVIDER_SITE_OTHER): Payer: HMO | Admitting: Internal Medicine

## 2021-02-06 ENCOUNTER — Encounter: Payer: Self-pay | Admitting: Internal Medicine

## 2021-02-06 ENCOUNTER — Other Ambulatory Visit: Payer: Self-pay

## 2021-02-06 VITALS — BP 136/84 | HR 82 | Temp 98.2°F | Resp 16 | Ht 66.0 in | Wt 243.0 lb

## 2021-02-06 DIAGNOSIS — D510 Vitamin B12 deficiency anemia due to intrinsic factor deficiency: Secondary | ICD-10-CM

## 2021-02-06 DIAGNOSIS — K2100 Gastro-esophageal reflux disease with esophagitis, without bleeding: Secondary | ICD-10-CM | POA: Diagnosis not present

## 2021-02-06 DIAGNOSIS — M48061 Spinal stenosis, lumbar region without neurogenic claudication: Secondary | ICD-10-CM

## 2021-02-06 DIAGNOSIS — E039 Hypothyroidism, unspecified: Secondary | ICD-10-CM

## 2021-02-06 DIAGNOSIS — M159 Polyosteoarthritis, unspecified: Secondary | ICD-10-CM

## 2021-02-06 DIAGNOSIS — I10 Essential (primary) hypertension: Secondary | ICD-10-CM

## 2021-02-06 DIAGNOSIS — E785 Hyperlipidemia, unspecified: Secondary | ICD-10-CM

## 2021-02-06 DIAGNOSIS — E118 Type 2 diabetes mellitus with unspecified complications: Secondary | ICD-10-CM

## 2021-02-06 DIAGNOSIS — M8949 Other hypertrophic osteoarthropathy, multiple sites: Secondary | ICD-10-CM

## 2021-02-06 DIAGNOSIS — F331 Major depressive disorder, recurrent, moderate: Secondary | ICD-10-CM | POA: Diagnosis not present

## 2021-02-06 LAB — TSH: TSH: 1.64 u[IU]/mL (ref 0.35–4.50)

## 2021-02-06 MED ORDER — FAMOTIDINE 40 MG PO TABS: 40.0000 mg | ORAL_TABLET | Freq: Every day | ORAL | 1 refills | Status: DC

## 2021-02-06 MED ORDER — FREESTYLE TEST VI STRP: ORAL_STRIP | 1 refills | Status: AC

## 2021-02-06 MED ORDER — GABAPENTIN 300 MG PO CAPS: 300.0000 mg | ORAL_CAPSULE | Freq: Three times a day (TID) | ORAL | 1 refills | Status: DC

## 2021-02-06 MED ORDER — CHLORTHALIDONE 25 MG PO TABS: 25.0000 mg | ORAL_TABLET | Freq: Every day | ORAL | 0 refills | Status: DC

## 2021-02-06 MED ORDER — CARVEDILOL 25 MG PO TABS: 25.0000 mg | ORAL_TABLET | Freq: Two times a day (BID) | ORAL | 0 refills | Status: DC

## 2021-02-06 MED ORDER — OXYCODONE-ACETAMINOPHEN 5-325 MG PO TABS
1.0000 | ORAL_TABLET | Freq: Three times a day (TID) | ORAL | 0 refills | Status: DC | PRN
Start: 2021-02-06 — End: 2021-08-06

## 2021-02-06 MED ORDER — PANTOPRAZOLE SODIUM 40 MG PO TBEC: 40.0000 mg | DELAYED_RELEASE_TABLET | Freq: Two times a day (BID) | ORAL | 1 refills | Status: DC

## 2021-02-06 MED ORDER — VIIBRYD STARTER PACK 10 & 20 MG PO KIT: 1.0000 | PACK | Freq: Every day | ORAL | 0 refills | Status: DC

## 2021-02-06 NOTE — Patient Instructions (Signed)
Major Depressive Disorder, Adult Major depressive disorder (MDD) is a mental health condition. It may also be called clinical depression or unipolar depression. MDD causes symptoms of sadness, hopelessness, and loss of interest in things. These symptoms last most of the day, almost every day, for 2 weeks. MDD can also cause physical symptoms. It can interfere with relationships and with everyday activities, such as work, school, and activities that are usually pleasant. MDD may be mild, moderate, or severe. It may be single-episode MDD, which happens once, or recurrent MDD, which may occur multiple times. What are the causes? The exact cause of this condition is not known. MDD is most likely caused by a combination of things, which may include:  Your personality traits.  Learned or conditioned behaviors or thoughts or feelings that reinforce negativity.  Any alcohol or substance misuse.  Long-term (chronic) physical or mental health illness.  Going through a traumatic experience or major life changes. What increases the risk? The following factors may make someone more likely to develop MDD:  A family history of depression.  Being a woman.  Troubled family relationships.  Abnormally low levels of certain brain chemicals.  Traumatic or painful events in childhood, especially abuse or loss of a parent.  A lot of stress from life experiences, such as poor living conditions or discrimination.  Chronic physical illness or other mental health disorders. What are the signs or symptoms? The main symptoms of MDD usually include:  Constant depressed or irritable mood.  A loss of interest in things and activities. Other symptoms include:  Sleeping or eating too much or too little.  Unexplained weight gain or weight loss.  Tiredness or low energy.  Being agitated, restless, or weak.  Feeling hopeless, worthless, or guilty.  Trouble thinking clearly or making  decisions.  Thoughts of suicide or thoughts of harming others.  Isolating oneself or avoiding other people or activities.  Trouble completing tasks, work, or any normal obligations. Severe symptoms of this condition may include:  Psychotic depression.This may include false beliefs, or delusions. It may also include seeing, hearing, tasting, smelling, or feeling things that are not real (hallucinations).  Chronic depression or persistent depressive disorder. This is low-level depression that lasts for at least 2 years.  Melancholic depression, or feeling extremely sad and hopeless.  Catatonic depression, which includes trouble speaking and trouble moving. How is this diagnosed? This condition may be diagnosed based on:  Your symptoms.  Your medical and mental health history. You may be asked questions about your lifestyle, including any drug and alcohol use.  A physical exam.  Blood tests to rule out other conditions. MDD is confirmed if you have the following symptoms most of the day, nearly every day, in a 2-week period:  Either a depressed mood or loss of interest.  At least four other MDD symptoms. How is this treated? This condition is usually treated by mental health professionals, such as psychologists, psychiatrists, and clinical social workers. You may need more than one type of treatment. Treatment may include:  Psychotherapy, also called talk therapy or counseling. Types of psychotherapy include: ? Cognitive behavioral therapy (CBT). This teaches you to recognize unhealthy feelings, thoughts, and behaviors, and replace them with positive thoughts and actions. ? Interpersonal therapy (IPT). This helps you to improve the way you communicate with others or relate to them. ? Family therapy. This treatment includes members of your family.  Medicines to treat anxiety and depression. These medicines help to balance the brain chemicals   that affect your emotions.  Lifestyle  changes. You may be asked to: ? Limit alcohol use and avoid drug use. ? Get regular exercise. ? Get plenty of sleep. ? Make healthy eating choices. ? Spend more time outdoors.  Brain stimulation. This may be done if symptoms are very severe and other treatments have not worked. Examples of this treatment are electroconvulsive therapy and transcranial magnetic stimulation. Follow these instructions at home: Activity  Exercise regularly and spend time outdoors.  Find activities that you enjoy doing, and make time to do them.  Find healthy ways to manage stress, such as: ? Meditation or deep breathing. ? Spending time in nature. ? Journaling.  Return to your normal activities as told by your health care provider. Ask your health care provider what activities are safe for you. Alcohol and drug use  If you drink alcohol: ? Limit how much you use to:  0-1 drink a day for women who are not pregnant.  0-2 drinks a day for men. ? Be aware of how much alcohol is in your drink. In the [place] one drink equals one 12 oz bottle of beer (355 mL), one 5 oz glass of wine (148 mL), or one 1 oz glass of hard liquor (44 mL). ? Discuss your alcohol use with your health care provider. Alcohol can affect any antidepressant medicines you are taking.  Discuss any drug use with your health care provider. General instructions  Take over-the-counter and prescription medicines only as told by your health care provider.  Eat a healthy diet and get plenty of sleep.  Consider joining a support group. Your health care provider may be able to recommend one.  Keep all follow-up visits as told by your health care provider. This is important.   Where to find more information  National Alliance on Mental Illness: www.nami.org  [place] National Institute of Mental Health: www.nimh.nih.gov Contact a health care provider if:  Your symptoms get worse.  You develop new symptoms. Get help right away if:  You  self-harm.  You have serious thoughts about hurting yourself or others.  You hallucinate. If you ever feel like you may hurt yourself or others, or have thoughts about taking your own life, get help right away. Go to your nearest emergency department or:  Call your local emergency services (911 in the [place]  Call a suicide crisis helpline, such as the National Suicide Prevention Lifeline at 1-800-273-8255. This is open 24 hours a day in the [place]  Text the Crisis Text Line at 741741 (in the [place] Summary  Major depressive disorder (MDD) is a mental health condition. MDD causes symptoms of sadness, hopelessness, and loss of interest in things. These symptoms last most of the day, almost every day, for 2 weeks.  The symptoms of MDD can interfere with relationships and with everyday activities.  Treatments and support are available for people who develop MDD. You may need more than one type of treatment.  Get help right away if you have serious thoughts about hurting yourself or others. This information is not intended to replace advice given to you by your health care provider. Make sure you discuss any questions you have with your health care provider. Document Revised: 11/11/2019 Document Reviewed: 11/11/2019 Elsevier Patient Education  2021 [employer].  

## 2021-02-06 NOTE — Progress Notes (Signed)
Subjective:  Patient ID: Valerie [name], female    DOB: 1945/12/27  Age: 75 y.o. MRN: [id number]  CC: Hypertension, Hyperlipidemia, Diabetes, and Gastroesophageal Reflux  This visit occurred during the SARS-CoV-2 public health emergency.  Safety protocols were in place, including screening questions prior to the visit, additional usage of staff PPE, and extensive cleaning of exam room while observing appropriate contact time as indicated for disinfecting solutions.    HPI [name] presents for f/up -  She complains of fatigue, crying spells, anhedonia, belching, and weight gain.  She is not taking duloxetine because she is felt it was too stimulating.  Outpatient Medications Prior to Visit  Medication Sig Dispense Refill   albuterol (PROVENTIL) (2.5 MG/3ML) 0.083% nebulizer solution      albuterol (VENTOLIN HFA) 108 (90 Base) MCG/ACT inhaler Inhale 2 puffs into the lungs every 6 (six) hours as needed. For shortness of breath 3 Inhaler 4   aspirin 81 MG tablet Take 81 mg by mouth daily.      atorvastatin (LIPITOR) 20 MG tablet TAKE 1 TABLET BY MOUTH EVERY DAY 90 tablet 1   budesonide-formoterol (SYMBICORT) 80-4.5 MCG/ACT inhaler Inhale 2 puffs into the lungs as needed.      Cholecalciferol 50 MCG (2000 UT) TABS Take 1 tablet (2,000 Units total) by mouth daily. 90 tablet 1   clobetasol ointment (TEMOVATE) 3.88 % Apply 1 application topically 2 (two) times daily. 60 g 1   Dulaglutide (TRULICITY) 1.5 EK/8.0KL SOPN Inject 0.5 mLs (1.5 mg total) into the skin once a week. 4 pen 0   Empagliflozin-metFORMIN HCl (SYNJARDY) 12.5-500 MG TABS Take 1 tablet by mouth 2 (two) times daily. 180 tablet 3   ferrous sulfate 325 (65 FE) MG tablet TAKE 1 TABLET BY MOUTH TWICE DAILY WITH A MEAL 180 tablet 0   Insulin Pen Needle 31G X 5 MM MISC Use once daily with insulin 100 each 3   irbesartan (AVAPRO) 300 MG tablet Take 0.5 tablets (150 mg total) by mouth daily. 90 tablet 1   latanoprost  (XALATAN) 0.005 % ophthalmic solution Place 1 drop into both eyes at bedtime.      MYRBETRIQ 25 MG TB24 tablet TAKE 1 TABLET BY MOUTH EVERY DAY 90 tablet 0   nystatin ointment (MYCOSTATIN) APPLY ON THE SKIN THREE TIMES DAILY     potassium chloride SA (KLOR-CON M20) 20 MEQ tablet TAKE 1 TABLET BY MOUTH TWICE A DAY 180 tablet 1   carvedilol (COREG) 25 MG tablet TAKE 1 TABLET (25 MG TOTAL) BY MOUTH 2 (TWO) TIMES DAILY WITH A MEAL. 180 tablet 0   chlorthalidone (HYGROTON) 25 MG tablet TAKE 1 TABLET BY MOUTH EVERY DAY 90 tablet 0   DULoxetine (CYMBALTA) 60 MG capsule TAKE 1 CAPSULE BY MOUTH EVERY DAY 90 capsule 1   famotidine (PEPCID) 40 MG tablet TAKE 1 TABLET BY MOUTH EVERYDAY AT BEDTIME 90 tablet 1   gabapentin (NEURONTIN) 300 MG capsule TAKE 1 CAPSULE BY MOUTH THREE TIMES DAILY 270 capsule 1   glucose blood (FREESTYLE TEST STRIPS) test strip USE TO TEST BLOOD SUGAR 3 TIMES A DAY. DX: E11.8 100 strip 11   oxyCODONE-acetaminophen (PERCOCET/ROXICET) 5-325 MG tablet Take 1 tablet by mouth every 8 (eight) hours as needed for severe pain. 90 tablet 0   pantoprazole (PROTONIX) 40 MG tablet TAKE 1 TABLET (40 MG TOTAL) BY MOUTH 2 (TWO) TIMES DAILY BEFORE A MEAL. 180 tablet 1   No facility-administered medications prior to visit.  ROS Review of Systems  Constitutional: Positive for fatigue and unexpected weight change. Negative for chills and diaphoresis.  HENT: Negative.   Eyes: Negative.   Respiratory: Negative for cough, chest tightness, shortness of breath and wheezing.   Cardiovascular: Negative for chest pain, palpitations and leg swelling.  Gastrointestinal: Negative for abdominal pain, constipation, diarrhea, nausea and vomiting.  Endocrine: Negative.   Genitourinary: Negative.  Negative for difficulty urinating.  Musculoskeletal: Positive for arthralgias and back pain. Negative for myalgias.  Skin: Negative.  Negative for color change and pallor.  Neurological: Negative.   Negative for dizziness, weakness, light-headedness and headaches.  Hematological: Negative for adenopathy. Does not bruise/bleed easily.  Psychiatric/Behavioral: Positive for dysphoric mood and sleep disturbance. Negative for agitation, behavioral problems, confusion, decreased concentration, hallucinations, self-injury and suicidal ideas. The patient is not nervous/anxious and is not hyperactive.     Objective:  BP 136/84    Pulse 82    Temp 98.2 F (36.8 C) (Oral)    Resp 16    Ht '5\' 6"'  (1.676 m)    Wt 243 lb (110.2 kg)    SpO2 99%    BMI 39.22 kg/m   BP Readings from Last 3 Encounters:  02/06/21 136/84  01/08/21 118/84  12/04/20 116/82    Wt Readings from Last 3 Encounters:  02/06/21 243 lb (110.2 kg)  01/08/21 242 lb (109.8 kg)  12/04/20 243 lb (110.2 kg)    Physical Exam Vitals reviewed.  HENT:     Nose: Nose normal.     Mouth/Throat:     Mouth: Mucous membranes are moist.  Eyes:     General: No scleral icterus.    Conjunctiva/sclera: Conjunctivae normal.  Cardiovascular:     Rate and Rhythm: Normal rate and regular rhythm.     Heart sounds: No murmur heard.   Pulmonary:     Effort: Pulmonary effort is normal.     Breath sounds: No stridor. No wheezing, rhonchi or rales.  Abdominal:     General: Abdomen is protuberant. Bowel sounds are normal. There is no distension.     Palpations: Abdomen is soft. There is no hepatomegaly, splenomegaly or mass.     Tenderness: There is no abdominal tenderness.  Musculoskeletal:        General: Normal range of motion.     Cervical back: Neck supple.     Right lower leg: No edema.     Left lower leg: No edema.  Lymphadenopathy:     Cervical: No cervical adenopathy.  Skin:    General: Skin is warm and dry.  Neurological:     General: No focal deficit present.     Mental Status: She is alert.  Psychiatric:        Attention and Perception: Attention and perception normal.        Mood and Affect: Mood is depressed. Mood is  not anxious. Affect is flat.        Speech: Speech normal. Speech is not delayed or tangential.        Behavior: Behavior normal. Behavior is cooperative.        Thought Content: Thought content normal. Thought content is not paranoid. Thought content does not include homicidal ideation.     Lab Results  Component Value Date   WBC 10.4 07/01/2020   HGB 11.8 07/01/2020   HCT 35.3 07/01/2020   PLT 359 07/01/2020   GLUCOSE 125 (H) 05/27/2020   CHOL 144 02/21/2020   TRIG 272.0 (H) 02/21/2020  HDL 47.30 02/21/2020   LDLDIRECT 57.0 02/21/2020   LDLCALC 43 03/28/2014   ALT 18 02/21/2020   AST 18 02/21/2020   NA 137 05/27/2020   K 3.5 05/27/2020   CL 98 05/27/2020   CREATININE 0.91 05/27/2020   BUN 20 05/27/2020   CO2 27 05/27/2020   TSH 1.64 02/06/2021   INR 1.0 02/21/2020   HGBA1C 6.9 (A) 10/01/2020   MICROALBUR 0.5 07/01/2020    MR ANKLE RIGHT WO CONTRAST  Result Date: 01/29/2021 CLINICAL DATA:  Onset right heel pain in October, 2021. No known injury. EXAM: MRI OF THE RIGHT ANKLE WITHOUT CONTRAST TECHNIQUE: Multiplanar, multisequence MR imaging of the ankle was performed. No intravenous contrast was administered. COMPARISON:  Plain films right ankle 10/31/2020 FINDINGS: TENDONS Peroneal: Intact.  Mild appearing peroneus brevis tendinosis noted. Posteromedial: Intact. Anterior: Intact. Achilles: Intact. Plantar Fascia: The medial cord is thickened and edematous and partially ruptured. Calcaneal spur noted. LIGAMENTS Lateral: Intact. The anterior talofibular ligament is attenuated compatible with remote sprain. Medial: Intact. CARTILAGE Ankle Joint: No joint effusion or osteochondral lesion of the talar dome. Subtalar Joints/Sinus Tarsi: Normal. Bones: No fracture, stress change or worrisome lesion. Other: None. 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 Mild appearing tendinosis of the peroneus brevis. Remote appearing sprain of  the anterior talofibular ligament without frank tear. Electronically Signed   By: Inge Rise M.D.   On: 01/29/2021 12:41    Assessment & Plan:   Christella was seen today for hypertension, hyperlipidemia, diabetes and gastroesophageal reflux.  Diagnoses and all orders for this visit:  Hypothyroidism, unspecified type- Her TSH is in the normal range.  She will stay on the current dose of levothyroxine. -     TSH; Future -     TSH  Essential hypertension, benign- Her blood pressure is adequately well controlled. -     carvedilol (COREG) 25 MG tablet; Take 1 tablet (25 mg total) by mouth 2 (two) times daily with a meal. -     chlorthalidone (HYGROTON) 25 MG tablet; Take 1 tablet (25 mg total) by mouth daily. -     TSH; Future -     TSH -     Basic metabolic panel; Future  Gastroesophageal reflux disease with esophagitis without hemorrhage -     famotidine (PEPCID) 40 MG tablet; Take 1 tablet (40 mg total) by mouth daily.  Type II diabetes mellitus with manifestations ([place])- I will monitor her A1c and will treat if indicated. -     glucose blood (FREESTYLE TEST STRIPS) test strip; USE TO TEST BLOOD SUGAR 3 TIMES A DAY. DX: E11.8 -     Basic metabolic panel; Future -     Hemoglobin A1c; Future  Spinal stenosis of lumbar 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 -     gabapentin (NEURONTIN) 300 MG capsule; Take 1 capsule (300 mg total) by mouth 3 (three) times daily. -     oxyCODONE-acetaminophen (PERCOCET/ROXICET) 5-325 MG tablet; Take 1 tablet by mouth every 8 (eight) hours as needed for severe pain.  Primary osteoarthritis involving multiple joints -     oxyCODONE-acetaminophen (PERCOCET/ROXICET) 5-325 MG tablet; Take 1 tablet by mouth every 8 (eight) hours as needed for severe pain.  Type 2 diabetes mellitus with complication, without long-term current use of insulin (HCC) -     chlorthalidone (HYGROTON) 25 MG tablet; Take 1 tablet (25 mg total) by mouth daily.  Vitamin B12 deficiency anemia  due to intrinsic factor  deficiency -     CBC with Differential/Platelet; Future  Hyperlipidemia with target LDL less than 100 -     TSH; Future -     TSH  Moderate episode of recurrent major depressive disorder ([place])- I recommended that she try vilazodone for this. -     Vilazodone HCl (VIIBRYD STARTER PACK) 10 & 20 MG KIT; Take 1 tablet by mouth daily with breakfast.  Other orders -     pantoprazole (PROTONIX) 40 MG tablet; Take 1 tablet (40 mg total) by mouth 2 (two) times daily before a meal.   I have discontinued Pamala Hurry A. Cedeno's DULoxetine. I have also changed her gabapentin, famotidine, and chlorthalidone. Additionally, I am having her start on [employer]. Lastly, I am having her maintain her latanoprost, aspirin, albuterol, albuterol, Insulin Pen Needle, irbesartan, nystatin ointment, Trulicity, potassium chloride SA, Myrbetriq, Synjardy, Cholecalciferol, clobetasol ointment, budesonide-formoterol, atorvastatin, ferrous sulfate, carvedilol, FREESTYLE TEST STRIPS, oxyCODONE-acetaminophen, and pantoprazole.  Meds ordered this encounter  Medications   gabapentin (NEURONTIN) 300 MG capsule    Sig: Take 1 capsule (300 mg total) by mouth 3 (three) times daily.    Dispense:  270 capsule    Refill:  1   carvedilol (COREG) 25 MG tablet    Sig: Take 1 tablet (25 mg total) by mouth 2 (two) times daily with a meal.    Dispense:  180 tablet    Refill:  0   famotidine (PEPCID) 40 MG tablet    Sig: Take 1 tablet (40 mg total) by mouth daily.    Dispense:  90 tablet    Refill:  1   glucose blood (FREESTYLE TEST STRIPS) test strip    Sig: USE TO TEST BLOOD SUGAR 3 TIMES A DAY. DX: E11.8    Dispense:  300 strip    Refill:  1   oxyCODONE-acetaminophen (PERCOCET/ROXICET) 5-325 MG tablet    Sig: Take 1 tablet by mouth every 8 (eight) hours as needed for severe pain.    Dispense:  90 tablet    Refill:  0   pantoprazole (PROTONIX) 40 MG tablet    Sig: Take 1 tablet (40 mg  total) by mouth 2 (two) times daily before a meal.    Dispense:  180 tablet    Refill:  1   chlorthalidone (HYGROTON) 25 MG tablet    Sig: Take 1 tablet (25 mg total) by mouth daily.    Dispense:  90 tablet    Refill:  0   Vilazodone HCl (VIIBRYD STARTER PACK) 10 & 20 MG KIT    Sig: Take 1 tablet by mouth daily with breakfast.    Dispense:  1 kit    Refill:  0     Follow-up: Return in about 3 months (around 05/06/2021).  [name], MD

## 2021-02-09 ENCOUNTER — Other Ambulatory Visit: Payer: Self-pay | Admitting: Internal Medicine

## 2021-02-09 DIAGNOSIS — E876 Hypokalemia: Secondary | ICD-10-CM

## 2021-02-09 DIAGNOSIS — I10 Essential (primary) hypertension: Secondary | ICD-10-CM

## 2021-02-10 ENCOUNTER — Other Ambulatory Visit: Payer: Self-pay

## 2021-02-10 ENCOUNTER — Other Ambulatory Visit (INDEPENDENT_AMBULATORY_CARE_PROVIDER_SITE_OTHER): Payer: HMO

## 2021-02-10 DIAGNOSIS — M792 Neuralgia and neuritis, unspecified: Secondary | ICD-10-CM | POA: Diagnosis not present

## 2021-02-10 DIAGNOSIS — D510 Vitamin B12 deficiency anemia due to intrinsic factor deficiency: Secondary | ICD-10-CM | POA: Diagnosis not present

## 2021-02-10 DIAGNOSIS — M722 Plantar fascial fibromatosis: Secondary | ICD-10-CM | POA: Diagnosis not present

## 2021-02-10 DIAGNOSIS — I10 Essential (primary) hypertension: Secondary | ICD-10-CM

## 2021-02-10 DIAGNOSIS — M2041 Other hammer toe(s) (acquired), right foot: Secondary | ICD-10-CM | POA: Diagnosis not present

## 2021-02-10 DIAGNOSIS — M2042 Other hammer toe(s) (acquired), left foot: Secondary | ICD-10-CM | POA: Diagnosis not present

## 2021-02-10 DIAGNOSIS — E118 Type 2 diabetes mellitus with unspecified complications: Secondary | ICD-10-CM | POA: Diagnosis not present

## 2021-02-10 DIAGNOSIS — E1351 Other specified diabetes mellitus with diabetic peripheral angiopathy without gangrene: Secondary | ICD-10-CM | POA: Diagnosis not present

## 2021-02-10 LAB — CBC WITH DIFFERENTIAL/PLATELET
Basophils Absolute: 0.1 10*3/uL (ref 0.0–0.1)
Basophils Relative: 0.9 % (ref 0.0–3.0)
Eosinophils Absolute: 0.3 10*3/uL (ref 0.0–0.7)
Eosinophils Relative: 2.8 % (ref 0.0–5.0)
HCT: 33 % — ABNORMAL LOW (ref 36.0–46.0)
Hemoglobin: 11 g/dL — ABNORMAL LOW (ref 12.0–15.0)
Lymphocytes Relative: 36.4 % (ref 12.0–46.0)
Lymphs Abs: 3.7 10*3/uL (ref 0.7–4.0)
MCHC: 33.3 g/dL (ref 30.0–36.0)
MCV: 88.9 fl (ref 78.0–100.0)
Monocytes Absolute: 0.9 10*3/uL (ref 0.1–1.0)
Monocytes Relative: 9 % (ref 3.0–12.0)
Neutro Abs: 5.2 10*3/uL (ref 1.4–7.7)
Neutrophils Relative %: 50.9 % (ref 43.0–77.0)
Platelets: 332 10*3/uL (ref 150.0–400.0)
RBC: 3.71 Mil/uL — ABNORMAL LOW (ref 3.87–5.11)
RDW: 17 % — ABNORMAL HIGH (ref 11.5–15.5)
WBC: 10.2 10*3/uL (ref 4.0–10.5)

## 2021-02-10 LAB — BASIC METABOLIC PANEL
BUN: 11 mg/dL (ref 6–23)
CO2: 30 mEq/L (ref 19–32)
Calcium: 9.9 mg/dL (ref 8.4–10.5)
Chloride: 98 mEq/L (ref 96–112)
Creatinine, Ser: 0.93 mg/dL (ref 0.40–1.20)
GFR: 61.22 mL/min (ref >60.00)
Glucose, Bld: 106 mg/dL — ABNORMAL HIGH (ref 70–99)
Potassium: 3.6 mEq/L (ref 3.5–5.1)
Sodium: 139 mEq/L (ref 135–145)

## 2021-02-10 LAB — HEMOGLOBIN A1C: Hgb A1c MFr Bld: 7 % — ABNORMAL HIGH (ref 4.6–6.5)

## 2021-02-10 NOTE — Telephone Encounter (Signed)
Received fax from [facility] regarding [employer] application.  Patient has not been approved because she may qualify for LIS - they will need LIS denial letter in order to be approved.

## 2021-02-11 ENCOUNTER — Telehealth: Payer: Self-pay | Admitting: Pharmacist

## 2021-02-11 NOTE — Progress Notes (Signed)
Spoke with Valerie [name] and asked if she had ever filled out a form for the low income subsidy or extra help forms. The patient stated that she has not filled out any other form except what [name] gave her to fill out at the office.   Wendy Poet, [place] [phone]

## 2021-02-11 NOTE — Progress Notes (Signed)
  Chronic Care Management Pharmacy Assistant   Name: Valerie [name]  MRN: [id number] DOB: 06/14/1946  Reason for Encounter: Diabetic Adherence Call   PCP : Jones, Thomas L, MD  Allergies:   Allergies  Allergen Reactions  . Food Swelling    bananas  . Penicillins Swelling and Rash    Medications: Outpatient Encounter Medications as of 02/11/2021  Medication Sig Note  . albuterol (PROVENTIL) (2.5 MG/3ML) 0.083% nebulizer solution    . albuterol (VENTOLIN HFA) 108 (90 Base) MCG/ACT inhaler Inhale 2 puffs into the lungs every 6 (six) hours as needed. For shortness of breath   . aspirin 81 MG tablet Take 81 mg by mouth daily.    . atorvastatin (LIPITOR) 20 MG tablet TAKE 1 TABLET BY MOUTH EVERY DAY   . budesonide-formoterol (SYMBICORT) 80-4.5 MCG/ACT inhaler Inhale 2 puffs into the lungs as needed.  10/03/2020: AZ&Me PAP  . carvedilol (COREG) 25 MG tablet Take 1 tablet (25 mg total) by mouth 2 (two) times daily with a meal.   . chlorthalidone (HYGROTON) 25 MG tablet Take 1 tablet (25 mg total) by mouth daily.   . Cholecalciferol 50 MCG (2000 UT) TABS Take 1 tablet (2,000 Units total) by mouth daily.   . clobetasol ointment (TEMOVATE) 0.05 % Apply 1 application topically 2 (two) times daily.   . Dulaglutide (TRULICITY) 1.5 MG/0.5ML SOPN Inject 0.5 mLs (1.5 mg total) into the skin once a week. 10/03/2020: [employer] PAP  . Empagliflozin-metFORMIN HCl (SYNJARDY) 12.5-500 MG TABS Take 1 tablet by mouth 2 (two) times daily. 10/03/2020: [employer] PAP  . famotidine (PEPCID) 40 MG tablet Take 1 tablet (40 mg total) by mouth daily.   . ferrous sulfate 325 (65 FE) MG tablet TAKE 1 TABLET BY MOUTH TWICE DAILY WITH A MEAL   . gabapentin (NEURONTIN) 300 MG capsule Take 1 capsule (300 mg total) by mouth 3 (three) times daily.   . glucose blood (FREESTYLE TEST STRIPS) test strip USE TO TEST BLOOD SUGAR 3 TIMES A DAY. DX: E11.8   . Insulin Pen Needle 31G X 5 MM MISC Use once daily with insulin   .  irbesartan (AVAPRO) 300 MG tablet Take 0.5 tablets (150 mg total) by mouth daily. 08/06/2020: Patient takes when BP > 150/90  . latanoprost (XALATAN) 0.005 % ophthalmic solution Place 1 drop into both eyes at bedtime.    . MYRBETRIQ 25 MG TB24 tablet TAKE 1 TABLET BY MOUTH EVERY DAY   . nystatin ointment (MYCOSTATIN) APPLY ON THE SKIN THREE TIMES DAILY   . oxyCODONE-acetaminophen (PERCOCET/ROXICET) 5-325 MG tablet Take 1 tablet by mouth every 8 (eight) hours as needed for severe pain.   . pantoprazole (PROTONIX) 40 MG tablet Take 1 tablet (40 mg total) by mouth 2 (two) times daily before a meal.   . potassium chloride SA (KLOR-CON M20) 20 MEQ tablet TAKE 1 TABLET BY MOUTH TWICE A DAY   . Vilazodone HCl (VIIBRYD STARTER PACK) 10 & 20 MG KIT Take 1 tablet by mouth daily with breakfast.    No facility-administered encounter medications on file as of 02/11/2021.    Current Diagnosis: Patient Active Problem List   Diagnosis Date Noted  . Tinea corporis 10/01/2020  . Degenerative arthritis of right knee 07/02/2020  . Moderate episode of recurrent major depressive disorder (HCC) 07/01/2020  . Esophageal dysphagia 05/27/2020  . Gastroesophageal reflux disease with esophagitis without hemorrhage 05/27/2020  . Diuretic-induced hypokalemia 03/27/2020  . OAB (overactive bladder) 03/27/2020  . Insomnia   secondary to chronic pain 02/21/2020  . Primary osteoarthritis involving multiple joints 10/10/2019  . Thiamine deficiency, unspecified 01/09/2019  . Eczema 08/18/2018  . Palpitations 08/18/2018  . Vitamin D deficiency disease 08/11/2018  . Long-term current use of opiate analgesic 06/21/2018  . External bleeding hemorrhoids 03/24/2018  . Obstructive sleep apnea treated with BiPAP 11/03/2017  . Atypical chest pain 08/29/2015  . Osteopenia 03/28/2014  . Insomnia 03/28/2014  . Routine general medical examination at a health care facility 07/17/2013  . Morbid obesity with BMI of 45.0-49.9, adult (HCC)  04/14/2013  . Visit for screening mammogram 01/20/2013  . Spinal stenosis of lumbar 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 10/05/2012  . Hypothyroidism 02/20/2011  . Fatty liver disease, nonalcoholic 02/20/2011  . Mild intermittent asthma 10/25/2009  . Type II diabetes mellitus with manifestations (HCC) 05/06/2009  . Hyperlipidemia with target LDL less than 100 05/06/2009  . B12 deficiency anemia 05/06/2009  . Depression with anxiety 05/06/2009  . Essential hypertension, benign 05/06/2009  . GERD 05/06/2009    Goals Addressed   None     Follow-Up:  Pharmacist Review   Recent Relevant Labs: Lab Results  Component Value Date/Time   HGBA1C 7.0 (H) 02/10/2021 12:42 PM   HGBA1C 6.9 (A) 10/01/2020 11:39 AM   HGBA1C 7.4 (H) 05/27/2020 01:59 PM   MICROALBUR 0.5 07/01/2020 11:43 AM   MICROALBUR 0.8 05/04/2019 12:12 PM    Kidney Function Lab Results  Component Value Date/Time   CREATININE 0.93 02/10/2021 12:42 PM   CREATININE 0.91 05/27/2020 01:59 PM   GFR 61.22 02/10/2021 12:42 PM   GFRNONAA >60 08/29/2015 06:30 AM   GFRAA >60 08/29/2015 06:30 AM    . Current antihyperglycemic regimen: The patient states that she takes trulicity and synjardy for diabetes  . What recent interventions/DTPs have been made to improve glycemic control: The patient states that she check blood sugar, and takes her medication every day  . Have there been any recent hospitalizations or ED visits since last visit with CPP? The patient states that she has not been to the hospital or the ED  . Patient denies hypoglycemic symptoms . Patient reports that she received a shot in her foot yesterday which will elevated her blood sugar. She is not having any hyperglycemic symptoms  . How often are you checking your blood sugar? The patient states that she check her blood sugar whenever she feels like it is to high or to low  . What are your blood sugars ranging?The patient states since she had this shot blood sugar has  been around 170-271 o Fasting: NA o Before meals: NA o After meals: NA o Bedtime: NA . During the week, how often does your blood glucose drop below 70? The patient does not believe blood sugar has been below 70  . Are you checking your feet daily/regularly? The patient states that she checks her feet daily and also goes to the podiatrist and has not problem with redness, blisters, swelling or sores. She did say that she has been dealing with tear on her right foot for a while  Adherence Review: Is the patient currently on a STATIN medication? Yes, atorvastatin Is the patient currently on ACE/ARB medication? Yes, Irbesartan Does the patient have >5 day gap between last estimated fill dates? No   Tracy Ellis, CPA [facility] [phone] 

## 2021-02-12 ENCOUNTER — Other Ambulatory Visit: Payer: Self-pay | Admitting: Internal Medicine

## 2021-02-12 DIAGNOSIS — D510 Vitamin B12 deficiency anemia due to intrinsic factor deficiency: Secondary | ICD-10-CM

## 2021-02-12 DIAGNOSIS — D539 Nutritional anemia, unspecified: Secondary | ICD-10-CM

## 2021-02-12 DIAGNOSIS — E519 Thiamine deficiency, unspecified: Secondary | ICD-10-CM

## 2021-02-12 NOTE — Telephone Encounter (Signed)
Patient needs to apply to LIS (Extra Help). [employer] will only approve her application for Synjardy if she is denied Extra Help.  Contacted patient to do application together online. No answer, left voicemail to call back.

## 2021-02-13 ENCOUNTER — Telehealth: Payer: Self-pay | Admitting: Internal Medicine

## 2021-02-13 NOTE — Telephone Encounter (Signed)
Patient returned call in regards to recent lab work. She can be reached at [phone]

## 2021-02-14 NOTE — Telephone Encounter (Signed)
Called pt, LVM.   

## 2021-02-24 DIAGNOSIS — M722 Plantar fascial fibromatosis: Secondary | ICD-10-CM | POA: Diagnosis not present

## 2021-02-24 DIAGNOSIS — M79671 Pain in right foot: Secondary | ICD-10-CM | POA: Diagnosis not present

## 2021-02-24 DIAGNOSIS — M79672 Pain in left foot: Secondary | ICD-10-CM | POA: Diagnosis not present

## 2021-02-24 DIAGNOSIS — M2041 Other hammer toe(s) (acquired), right foot: Secondary | ICD-10-CM | POA: Diagnosis not present

## 2021-02-24 DIAGNOSIS — E1351 Other specified diabetes mellitus with diabetic peripheral angiopathy without gangrene: Secondary | ICD-10-CM | POA: Diagnosis not present

## 2021-02-24 DIAGNOSIS — M2042 Other hammer toe(s) (acquired), left foot: Secondary | ICD-10-CM | POA: Diagnosis not present

## 2021-02-27 ENCOUNTER — Ambulatory Visit (INDEPENDENT_AMBULATORY_CARE_PROVIDER_SITE_OTHER): Payer: HMO

## 2021-02-27 ENCOUNTER — Telehealth: Payer: Self-pay | Admitting: Pharmacist

## 2021-02-27 ENCOUNTER — Other Ambulatory Visit: Payer: Self-pay

## 2021-02-27 VITALS — BP 170/80 | HR 86 | Temp 97.8°F | Ht 66.0 in | Wt 245.4 lb

## 2021-02-27 DIAGNOSIS — Z0000 Encounter for general adult medical examination without abnormal findings: Secondary | ICD-10-CM

## 2021-02-27 NOTE — Telephone Encounter (Signed)
Patient was in office today for AWV and asked to see pharmacist to discuss Synjardy patient assistance.  [employer] previously declined patient assistance due to patient-reported income on application below threshold for LIS.  Today patient reports her social security income has increased this year after her husband passed last year. She reports income is over $2000 per month, which is above LIS treshold.  Patient will provide copy of new income documents for 2022. Will fax to [facility] when available.

## 2021-02-27 NOTE — Progress Notes (Signed)
Subjective:   Valerie [name] is a 75 y.o. female who presents for Medicare Annual (Subsequent) preventive examination.  Review of Systems    No ROS. Medicare Wellness Visit. Additional risk factors are reflected in social history. Cardiac Risk Factors include: advanced age (>2mn, >>43women);diabetes mellitus;dyslipidemia;family history of premature cardiovascular disease;hypertension;obesity (BMI >30kg/m2)     Objective:    Today's Vitals   02/27/21 1033  BP: (!) 170/80  Pulse: 86  Temp: 97.8 F (36.6 C)  SpO2: 97%  Weight: 245 lb 6.4 oz (111.3 kg)  Height: 5' 6" (1.676 m)   Body mass index is 39.61 kg/m.  Advanced Directives 02/27/2021 03/18/2020 03/08/2020 03/17/2019 02/23/2019 05/03/2018 04/15/2018  Does Patient Have a Medical Advance Directive? Yes _0  No  Does patient want to make changes to medical advance directive? No - Patient declined No - Patient declined - - - - -  Would patient like information on creating a medical advance directive? - No - Patient declined Yes (MAU/Ambulatory/Procedural Areas - Information given) No - Guardian declined Yes (MAU/Ambulatory/Procedural Areas - Information given) Yes (MAU/Ambulatory/Procedural Areas - Information given) Yes (MAU/Ambulatory/Procedural Areas - Information given)    Current Medications (verified) Outpatient Encounter Medications as of 02/27/2021  Medication Sig  . albuterol (PROVENTIL) (2.5 MG/3ML) 0.083% nebulizer solution   . albuterol (VENTOLIN HFA) 108 (90 Base) MCG/ACT inhaler Inhale 2 puffs into the lungs every 6 (six) hours as needed. For shortness of breath  . aspirin 81 MG tablet Take 81 mg by mouth daily.   .Marland Kitchenatorvastatin (LIPITOR) 20 MG tablet TAKE 1 TABLET BY MOUTH EVERY DAY  . budesonide-formoterol (SYMBICORT) 80-4.5 MCG/ACT inhaler Inhale 2 puffs into the lungs as needed.   . carvedilol (COREG) 25 MG tablet Take 1 tablet (25 mg total) by mouth 2 (two) times daily with a meal.  . chlorthalidone  (HYGROTON) 25 MG tablet Take 1 tablet (25 mg total) by mouth daily.  . Cholecalciferol 50 MCG (2000 UT) TABS Take 1 tablet (2,000 Units total) by mouth daily.  . clobetasol ointment (TEMOVATE) 06.26% Apply 1 application topically 2 (two) times daily.  . Dulaglutide (TRULICITY) 1.5 MRS/8.5IOSOPN Inject 0.5 mLs (1.5 mg total) into the skin once a week.  . Empagliflozin-metFORMIN HCl (SYNJARDY) 12.5-500 MG TABS Take 1 tablet by mouth 2 (two) times daily.  . famotidine (PEPCID) 40 MG tablet Take 1 tablet (40 mg total) by mouth daily.  . ferrous sulfate 325 (65 FE) MG tablet TAKE 1 TABLET BY MOUTH TWICE DAILY WITH A MEAL  . gabapentin (NEURONTIN) 300 MG capsule Take 1 capsule (300 mg total) by mouth 3 (three) times daily.  .Marland Kitchenglucose blood (FREESTYLE TEST STRIPS) test strip USE TO TEST BLOOD SUGAR 3 TIMES A DAY. DX: E11.8  . Insulin Pen Needle 31G X 5 MM MISC Use once daily with insulin  . irbesartan (AVAPRO) 300 MG tablet Take 0.5 tablets (150 mg total) by mouth daily.  .Marland Kitchenlatanoprost (XALATAN) 0.005 % ophthalmic solution Place 1 drop into both eyes at bedtime.   .Marland KitchenMYRBETRIQ 25 MG TB24 tablet TAKE 1 TABLET BY MOUTH EVERY DAY  . nystatin ointment (MYCOSTATIN) APPLY ON THE SKIN THREE TIMES DAILY  . oxyCODONE-acetaminophen (PERCOCET/ROXICET) 5-325 MG tablet Take 1 tablet by mouth every 8 (eight) hours as needed for severe pain.  . pantoprazole (PROTONIX) 40 MG tablet Take 1 tablet (40 mg total) by mouth 2 (two) times daily before a meal.  . potassium chloride SA (  KLOR-CON M20) 20 MEQ tablet TAKE 1 TABLET BY MOUTH TWICE A DAY  . Vilazodone HCl (VIIBRYD STARTER PACK) 10 & 20 MG KIT Take 1 tablet by mouth daily with breakfast.   No facility-administered encounter medications on file as of 02/27/2021.    Allergies (verified) Food and Penicillins   History: Past Medical History:  Diagnosis Date  . Anemia   . Anxiety and depression   . Arthritis   . Asthma   . Cataract   . Degenerative arthritis    . Depression   . Diabetes mellitus, type 2 ([place])   . Fatigue   . GERD (gastroesophageal reflux disease)   . Glaucoma   . Heart palpitations   . Hemorrhoids   . Hyperlipidemia   . Hypertension   . Hypothyroidism   . Hypoxemia 11/24/2013  . Memory deficit 10/04/2013  . Obesity   . Sleep apnea    BiPAP  . Snoring disorder    Past Surgical History:  Procedure Laterality Date  . benign tumors resected    . CATARACT EXTRACTION Left   . FOOT SURGERY Left    bone spur  . REFRACTIVE SURGERY     Glaucoma  . ROTATOR CUFF REPAIR Left   . TUBAL LIGATION     Family History  Problem Relation Age of Onset  . Alcohol abuse Mother   . Heart attack Mother   . Coronary artery disease Brother   . Heart attack Brother   . Hypertension Brother   . Hyperlipidemia Brother   . Heart attack Father   . Hypertension Father   . Hyperlipidemia Father   . Heart disease Sister   . Atrial fibrillation Sister   . Hypertension Sister   . Hyperlipidemia Sister   . Hypertension Other        family history  . Alcohol abuse Other   . Colon cancer Brother 7  . Hypertension Brother   . Hyperlipidemia Brother   . Dementia Brother   . Hypertension Brother   . Hyperlipidemia Brother   . Diabetes Son   . Hypertension Son   . Hyperlipidemia Son   . Diabetes Son   . Hypertension Son   . Hyperlipidemia Son   . Cerebral palsy Son   . Hypertension Son   . Hyperlipidemia Son    Social History   Socioeconomic History  . Marital status: Widowed    Spouse name: Not on file  . Number of children: 3  . Years of education: 11th  . Highest education level: 11th grade  Occupational History  . Occupation: disabled  Tobacco Use  . Smoking status: Never Smoker  . Smokeless tobacco: Never Used  Vaping Use  . Vaping Use: Never used  Substance and Sexual Activity  . Alcohol use: No    Alcohol/week: 0.0 standard drinks  . Drug use: No  . Sexual activity: Not Currently    Comment: not working, lives  with husband, 3 sons  Other Topics Concern  . Not on file  Social History Narrative   Client's spouse recently passed Feb 2021    also caregiver for son who is 66 yo and is disabled.   Social Determinants of Health   Financial Resource Strain: Low Risk   . Difficulty of Paying Living Expenses: Not hard at all  Food Insecurity: No Food Insecurity  . Worried About Charity fundraiser in the Last Year: Never true  . Ran Out of Food in the Last Year: Never true  Transportation Needs: No Transportation Needs  . Lack of Transportation (Medical): No  . Lack of Transportation (Non-Medical): No  Physical Activity: Inactive  . Days of Exercise per Week: 0 days  . Minutes of Exercise per Session: 0 min  Stress: Stress Concern Present  . Feeling of Stress : Rather much  Social Connections: Moderately Integrated  . Frequency of Communication with Friends and Family: More than three times a week  . Frequency of Social Gatherings with Friends and Family: Once a week  . Attends Religious Services: 1 to 4 times per year  . Active Member of Clubs or Organizations: No  . Attends Archivist Meetings: 1 to 4 times per year  . Marital Status: Widowed    Tobacco Counseling Counseling given: Not Answered   Clinical Intake:  Pre-visit preparation completed: Yes  Pain : No/denies pain     BMI - recorded: 39.61 Nutritional Status: BMI > 30  Obese Nutritional Risks: None Diabetes: Yes CBG done?: No Did pt. bring in CBG monitor from home?: No  How often do you need to have someone help you when you read instructions, pamphlets, or other written materials from your doctor or pharmacy?: 1 - Never What is the last grade level you completed in school?: refused  Diabetic? yes  Interpreter Needed?: No  Information entered by :: Lisette Abu, LPN   Activities of Daily Living In your present state of health, do you have any difficulty performing the following activities:  02/27/2021 03/18/2020  Hearing? N N  Vision? N N  Difficulty concentrating or making decisions? Y N  Walking or climbing stairs? N N  Dressing or bathing? N N  Doing errands, shopping? N N  Preparing Food and eating ? N N  Using the Toilet? N N  In the past six months, have you accidently leaked urine? N N  Do you have problems with loss of bowel control? N N  Managing your Medications? N N  Managing your Finances? N N  Housekeeping or managing your Housekeeping? N N  Some recent data might be hidden    Patient Care Team: Janith Lima, MD as PCP - General Gwenlyn Found Pearletha Forge, MD as PCP - Cardiology (Cardiology) Dohmeier, Asencion Partridge, MD as Consulting Physician (Neurology) Heath Lark, MD as Consulting Physician (Hematology and Oncology) Charlton Haws, [facility] as Pharmacist (Pharmacist)  Indicate any recent Medical Services you may have received from other than [facility] providers in the past year (date may be approximate).     Assessment:   This is a routine wellness examination for [place].  Hearing/Vision screen No exam data present  Dietary issues and exercise activities discussed: Current Exercise Habits: The patient does not participate in regular exercise at present, Exercise limited by: cardiac condition(s);respiratory conditions(s);psychological condition(s)  Goals    . Pharmacy Care Plan     CARE PLAN ENTRY (see longitudinal plan of care for additional care plan information)  Current Barriers:  . Chronic Disease Management support, education, and care coordination needs related to Hypertension, Hyperlipidemia, Diabetes, Asthma, and Depression   Hypertension BP Readings from Last 3 Encounters:  10/31/20 118/82  10/29/20 127/72  10/01/20 132/80 .  Pharmacist Clinical Goal(s): o Over the next 180 days, patient will work with PharmD and providers to maintain BP goal <130/80 . Current regimen:  o Irbesartan 300 mg - 1/2 tablet daily o Chlorthalidone 25 mg  daily o Carvedilol 25 mg twice a day . Interventions: o Discussed BP goals and benefits of  medications for prevention of heart attack / stroke . Patient self care activities - Over the next  180 days, patient will: o Check BP daily, document, and provide at future appointments o Ensure daily salt intake < 2300 mg/day  Hyperlipidemia Lab Results  Component Value Date/Time   LDLCALC 43 03/28/2014 03:11 PM   LDLDIRECT 57.0 02/21/2020 02:22 PM .  Pharmacist Clinical Goal(s): o Over the next 180 days, patient will work with PharmD and providers to maintain LDL goal < 100 . Current regimen:  o Atorvastatin 20 mg daily o Aspirin 81 mg daily . Interventions: o Discussed cholesterol goals and benefits of medications for prevention of heart attack / stroke . Patient self care activities - Over the next 180 days, patient will: o Continue medication as prescribed  Diabetes Lab Results  Component Value Date/Time   HGBA1C 6.9 (A) 10/01/2020 11:39 AM   HGBA1C 7.4 (H) 05/27/2020 01:59 PM   HGBA1C 7.3 (H) 02/21/2020 02:22 PM .  Pharmacist Clinical Goal(s): o Over the next 180 days, patient will work with PharmD and providers to achieve A1c goal <8% . Current regimen:  o Trulicity 1.5 mg weekly (via PAP) o Synjardy 12.5-500 mg twice a day (via PAP) . Interventions: o Discussed blood sugar goals and benefits of medications for prevention of diabetic complications o Renew patient assistance . Patient self care activities - Over the next 180 days, patient will: o Check blood sugar once daily and in the morning before eating or drinking, document, and provide at future appointments o Contact provider with any episodes of hypoglycemia o Complete patient assistance renewal applications  Asthma . Pharmacist Clinical Goal(s) o Over the next 180 days, patient will work with PharmD and providers to optimize therapy . Current regimen:  o Symbicort HFA 80-4.5 mcg/act 2 puffs as needed o Albuterol  nebulized PRN . Interventions: o Renew Symbicort patient assistance . Patient self care activities - Over the next 180 days, patient will: o Carry inhaler at all times  Depression . Pharmacist Clinical Goal(s) o Over the next 180 days, patient will work with PharmD and providers to optimize therapy . Current regimen:  o Duloxetine 60 mg . Interventions: o Discussed benefits of duloxetine for depression and chronic pain . Patient self care activities - Over the next 180 days, patient will: o Continue current medications  Medication management . Pharmacist Clinical Goal(s): o Over the next 180 days, patient will work with PharmD and providers to maintain optimal medication adherence . Current pharmacy: [facility] . Interventions o Comprehensive medication review performed. o Continue current medication management strategy . Patient self care activities - Over the next 180 days, patient will: o Focus on medication adherence by pill box o Take medications as prescribed o Report any questions or concerns to PharmD and/or provider(s)  Please see past updates related to this goal by clicking on the "Past Updates" button in the selected goal       Depression Screen PHQ 2/9 Scores 02/27/2021 05/27/2020 03/18/2020 03/08/2020 02/21/2020 06/14/2019 03/17/2019  PHQ - 2 Score _0 0  PHQ- 9 Score _1 -  Exception Documentation - - - - - - -    Fall Risk Fall Risk  02/27/2021 03/18/2020 02/21/2020 07/10/2019 03/17/2019  Falls in the past year? 1 0 0 0 0  Comment - - - - -  Number falls in past yr: 0 0 0 - 0  Comment - - - - -  Injury with Fall? 0 0 0 - 0  Risk Factor Category  - - - - -  Risk for fall due to : No Fall Risks No Fall Risks Impaired balance/gait - -  Follow up Falls evaluation completed Education provided;Falls prevention discussed Falls evaluation completed - -    FALL RISK PREVENTION PERTAINING TO THE HOME:  Any stairs in or around the home? No  If so, are there any  without handrails? No  Home free of loose throw rugs in walkways, pet beds, electrical cords, etc? Yes  Adequate lighting in your home to reduce risk of falls? Yes   ASSISTIVE DEVICES UTILIZED TO PREVENT FALLS:  Life alert? No  Use of a cane, walker or w/c? No  Grab bars in the bathroom? Yes  Shower chair or bench in shower? Yes  Elevated toilet seat or a handicapped toilet? No   TIMED UP AND GO:  Was the test performed? No .  Length of time to ambulate 10 feet: 0 sec.   Gait steady and fast without use of assistive device  Cognitive Function: Normal cognitive status assessed by direct observation by this Nurse Health Advisor. No abnormalities found.   MMSE - Mini Mental State Exam 04/29/2016 04/29/2015 04/29/2015  Not completed: (No Data) (No Data) Unable to complete        Immunizations Immunization History  Administered Date(s) Administered  . Fluad Quad(high Dose 65+) 09/13/2019  . Influenza Split 09/21/2011, 08/19/2012  . Influenza Whole 09/25/2009, 09/04/2010  . Influenza, High Dose Seasonal PF 09/23/2016, 10/06/2017, 08/18/2018  . Influenza,inj,Quad PF,6+ Mos 09/01/2013, 11/29/2014, 08/30/2015, 08/13/2020  . PFIZER(Purple Top)SARS-COV-2 Vaccination 02/11/2020, 03/06/2020, 09/19/2020  . Pneumococcal Conjugate-13 07/13/2014  . Pneumococcal Polysaccharide-23 01/20/2013, 04/18/2018  . Td 02/05/2010  . Tdap 06/02/2017  . Zoster Recombinat (Shingrix) 08/18/2018, 01/04/2019    TDAP status: Up to date  Flu Vaccine status: Up to date  Pneumococcal vaccine status: Up to date  Covid-19 vaccine status: Completed vaccines  Qualifies for Shingles Vaccine? Yes   Zostavax completed No   Shingrix Completed?: Yes  Screening Tests Health Maintenance  Topic Date Due  . OPHTHALMOLOGY EXAM  06/25/2020  . FOOT EXAM  07/01/2021  . HEMOGLOBIN A1C  08/10/2021  . MAMMOGRAM  05/02/2022  . TETANUS/TDAP  06/03/2027  . COLONOSCOPY (Pts 45-61yr Insurance coverage will need to be  confirmed)  02/12/2028  . INFLUENZA VACCINE  Completed  . DEXA SCAN  Completed  . COVID-19 Vaccine  Completed  . Hepatitis C Screening  Completed  . PNA vac Low Risk Adult  Completed  . HPV VACCINES  Aged Out    Health Maintenance  Health Maintenance Due  Topic Date Due  . OPHTHALMOLOGY EXAM  06/25/2020    Colorectal cancer screening: Type of screening: Colonoscopy. Completed 02/11/2018. Repeat every 10 years  Mammogram status: Completed 05/02/2020. Repeat every year  Bone Density status: Completed 05/02/2020. Results reflect: Bone density results: NORMAL. Repeat every 0 years.  (completed)  Lung Cancer Screening: (Low Dose CT Chest recommended if Age 73-80years, 30 pack-year currently smoking OR have quit w/in 15years.) does not qualify.   Lung Cancer Screening Referral: no  Additional Screening:  Hepatitis C Screening: does qualify; Completed yes  Vision Screening: Recommended annual ophthalmology exams for early detection of glaucoma and other disorders of the eye. Is the patient up to date with their annual eye exam?  Yes  Who is the provider or what is the name of the office in which the patient attends  annual eye exams? [facility] If pt is not established with a provider, would they like to be referred to a provider to establish care? No .   Dental Screening: Recommended annual dental exams for proper oral hygiene  Community Resource Referral / Chronic Care Management: CRR required this visit?  No   CCM required this visit?  No      Plan:     I have personally reviewed and noted the following in the patient's chart:   . Medical and social history . Use of alcohol, tobacco or illicit drugs  . Current medications and supplements . Functional ability and status . Nutritional status . Physical activity . Advanced directives . List of other physicians . Hospitalizations, surgeries, and ER visits in previous 12 months . Vitals . Screenings to include  cognitive, depression, and falls . Referrals and appointments  In addition, I have reviewed and discussed with patient certain preventive protocols, quality metrics, and best practice recommendations. A written personalized care plan for preventive services as well as general preventive health recommendations were provided to patient.     Sheral Flow, LPN   9/37/1696   Nurse Notes:  Medications reviewed with patient; opioid use noted.

## 2021-02-27 NOTE — Patient Instructions (Signed)
Ms. Schey , Thank you for taking time to come for your Medicare Wellness Visit. I appreciate your ongoing commitment to your health goals. Please review the following plan we discussed and let me know if I can assist you in the future.   Screening recommendations/referrals: Colonoscopy: 02/11/2018; due every 10 years (2029) Mammogram: 05/02/2020; due every 1-2 years Bone Density: 05/02/2020; results: normal Recommended yearly ophthalmology/optometry visit for glaucoma screening and checkup Recommended yearly dental visit for hygiene and checkup  Vaccinations: Influenza vaccine: 08/13/2020 Pneumococcal vaccine: 07/13/2014, 04/18/2018 Tdap vaccine: 06/02/2017 Shingles vaccine: 08/18/2018, 01/04/2019   Covid-19: 02/11/2020, 03/06/2020, 09/19/2020  Advanced directives: Please bring a copy of your health care power of attorney and living will to the office at your convenience.  Conditions/risks identified:  Yes; Reviewed health maintenance screenings with patient today and relevant education, vaccines, and/or referrals were provided. Please continue to do your personal lifestyle choices by: daily care of teeth and gums, regular physical activity (goal should be 5 days a week for 30 minutes), eat a healthy diet, avoid tobacco and drug use, limiting any alcohol intake, taking a low-dose aspirin (if not allergic or have been advised by your provider otherwise) and taking vitamins and minerals as recommended by your provider. Continue doing brain stimulating activities (puzzles, reading, adult coloring books, staying active) to keep memory sharp. Continue to eat heart healthy diet (full of fruits, vegetables, whole grains, lean protein, water--limit salt, fat, and sugar intake) and increase physical activity as tolerated.  Next appointment: Please schedule your next Medicare Wellness Visit with your Nurse Health Advisor in 1 year by calling [phone].  Preventive Care 46 Years and Older, Female Preventive care  refers to lifestyle choices and visits with your health care provider that can promote health and wellness. What does preventive care include?  A yearly physical exam. This is also called an annual well check.  Dental exams once or twice a year.  Routine eye exams. Ask your health care provider how often you should have your eyes checked.  Personal lifestyle choices, including:  Daily care of your teeth and gums.  Regular physical activity.  Eating a healthy diet.  Avoiding tobacco and drug use.  Limiting alcohol use.  Practicing safe sex.  Taking low-dose aspirin every day.  Taking vitamin and mineral supplements as recommended by your health care provider. What happens during an annual well check? The services and screenings done by your health care provider during your annual well check will depend on your age, overall health, lifestyle risk factors, and family history of disease. Counseling  Your health care provider may ask you questions about your:  Alcohol use.  Tobacco use.  Drug use.  Emotional well-being.  Home and relationship well-being.  Sexual activity.  Eating habits.  History of falls.  Memory and ability to understand (cognition).  Work and work Statistician.  Reproductive health. Screening  You may have the following tests or measurements:  Height, weight, and BMI.  Blood pressure.  Lipid and cholesterol levels. These may be checked every 5 years, or more frequently if you are over 23 years old.  Skin check.  Lung cancer screening. You may have this screening every year starting at age 78 if you have a 30-pack-year history of smoking and currently smoke or have quit within the past 15 years.  Fecal occult blood test (FOBT) of the stool. You may have this test every year starting at age 70.  Flexible sigmoidoscopy or colonoscopy. You may have a sigmoidoscopy  every 5 years or a colonoscopy every 10 years starting at age  72.  Hepatitis C blood test.  Hepatitis B blood test.  Sexually transmitted disease (STD) testing.  Diabetes screening. This is done by checking your blood sugar (glucose) after you have not eaten for a while (fasting). You may have this done every 1-3 years.  Bone density scan. This is done to screen for osteoporosis. You may have this done starting at age 41.  Mammogram. This may be done every 1-2 years. Talk to your health care provider about how often you should have regular mammograms. Talk with your health care provider about your test results, treatment options, and if necessary, the need for more tests. Vaccines  Your health care provider may recommend certain vaccines, such as:  Influenza vaccine. This is recommended every year.  Tetanus, diphtheria, and acellular pertussis (Tdap, Td) vaccine. You may need a Td booster every 10 years.  Zoster vaccine. You may need this after age 72.  Pneumococcal 13-valent conjugate (PCV13) vaccine. One dose is recommended after age 29.  Pneumococcal polysaccharide (PPSV23) vaccine. One dose is recommended after age 27. Talk to your health care provider about which screenings and vaccines you need and how often you need them. This information is not intended to replace advice given to you by your health care provider. Make sure you discuss any questions you have with your health care provider. Document Released: 12/27/2015 Document Revised: 08/19/2016 Document Reviewed: 10/01/2015 Elsevier Interactive Patient Education  2017 [place] Prevention in the Home Falls can cause injuries. They can happen to people of all ages. There are many things you can do to make your home safe and to help prevent falls. What can I do on the outside of my home?  Regularly fix the edges of walkways and driveways and fix any cracks.  Remove anything that might make you trip as you walk through a door, such as a raised step or threshold.  Trim  any bushes or trees on the path to your home.  Use bright outdoor lighting.  Clear any walking paths of anything that might make someone trip, such as rocks or tools.  Regularly check to see if handrails are loose or broken. Make sure that both sides of any steps have handrails.  Any raised decks and porches should have guardrails on the edges.  Have any leaves, snow, or ice cleared regularly.  Use sand or salt on walking paths during winter.  Clean up any spills in your garage right away. This includes oil or grease spills. What can I do in the bathroom?  Use night lights.  Install grab bars by the toilet and in the tub and shower. Do not use towel bars as grab bars.  Use non-skid mats or decals in the tub or shower.  If you need to sit down in the shower, use a plastic, non-slip stool.  Keep the floor dry. Clean up any water that spills on the floor as soon as it happens.  Remove soap buildup in the tub or shower regularly.  Attach bath mats securely with double-sided non-slip rug tape.  Do not have throw rugs and other things on the floor that can make you trip. What can I do in the bedroom?  Use night lights.  Make sure that you have a light by your bed that is easy to reach.  Do not use any sheets or blankets that are too big for your bed. They should  not hang down onto the floor.  Have a firm chair that has side arms. You can use this for support while you get dressed.  Do not have throw rugs and other things on the floor that can make you trip. What can I do in the kitchen?  Clean up any spills right away.  Avoid walking on wet floors.  Keep items that you use a lot in easy-to-reach places.  If you need to reach something above you, use a strong step stool that has a grab bar.  Keep electrical cords out of the way.  Do not use floor polish or wax that makes floors slippery. If you must use wax, use non-skid floor wax.  Do not have throw rugs and other  things on the floor that can make you trip. What can I do with my stairs?  Do not leave any items on the stairs.  Make sure that there are handrails on both sides of the stairs and use them. Fix handrails that are broken or loose. Make sure that handrails are as long as the stairways.  Check any carpeting to make sure that it is firmly attached to the stairs. Fix any carpet that is loose or worn.  Avoid having throw rugs at the top or bottom of the stairs. If you do have throw rugs, attach them to the floor with carpet tape.  Make sure that you have a light switch at the top of the stairs and the bottom of the stairs. If you do not have them, ask someone to add them for you. What else can I do to help prevent falls?  Wear shoes that:  Do not have high heels.  Have rubber bottoms.  Are comfortable and fit you well.  Are closed at the toe. Do not wear sandals.  If you use a stepladder:  Make sure that it is fully opened. Do not climb a closed stepladder.  Make sure that both sides of the stepladder are locked into place.  Ask someone to hold it for you, if possible.  Clearly mark and make sure that you can see:  Any grab bars or handrails.  First and last steps.  Where the edge of each step is.  Use tools that help you move around (mobility aids) if they are needed. These include:  Canes.  Walkers.  Scooters.  Crutches.  Turn on the lights when you go into a dark area. Replace any light bulbs as soon as they burn out.  Set up your furniture so you have a clear path. Avoid moving your furniture around.  If any of your floors are uneven, fix them.  If there are any pets around you, be aware of where they are.  Review your medicines with your doctor. Some medicines can make you feel dizzy. This can increase your chance of falling. Ask your doctor what other things that you can do to help prevent falls. This information is not intended to replace advice given to  you by your health care provider. Make sure you discuss any questions you have with your health care provider. Document Released: 09/26/2009 Document Revised: 05/07/2016 Document Reviewed: 01/04/2015 Elsevier Interactive Patient Education  2017 [employer].

## 2021-03-10 NOTE — Progress Notes (Signed)
A call was made to [place] to check on the status of the patient's patient assistance form for Synjardy. Spoke with a representative (Amy) and informed her that the application has been faxed on 01/29/21 and again on 03/05/21. The representative stated that there is nothing showing up for this patient on either date. The representative gave me an emergency fax number so that the application can be re-faxed with the patient's name, dob and date, The fax number is [phone].  Valerie [name], [place]

## 2021-03-11 LAB — HM DIABETES EYE EXAM

## 2021-03-11 NOTE — Telephone Encounter (Signed)
Re-faxed Synjardy application to [employer] as requested.

## 2021-03-14 NOTE — Telephone Encounter (Signed)
Re-faxed Synjardy application to [phone] as requested.

## 2021-03-14 NOTE — Progress Notes (Signed)
A call was made to [place] to check on the status of the patient's patient assistance form for synjardy. The representative Manuela Schwartz) today stated that as of yet an application has not been received but there is an alternated number to re-fax form to and that number is [phone].   [place]

## 2021-03-17 DIAGNOSIS — E1351 Other specified diabetes mellitus with diabetic peripheral angiopathy without gangrene: Secondary | ICD-10-CM | POA: Diagnosis not present

## 2021-03-17 DIAGNOSIS — M722 Plantar fascial fibromatosis: Secondary | ICD-10-CM | POA: Diagnosis not present

## 2021-03-17 DIAGNOSIS — M898X7 Other specified disorders of bone, ankle and foot: Secondary | ICD-10-CM | POA: Diagnosis not present

## 2021-03-17 DIAGNOSIS — M21962 Unspecified acquired deformity of left lower leg: Secondary | ICD-10-CM | POA: Diagnosis not present

## 2021-03-19 DIAGNOSIS — H33193 Other retinoschisis and retinal cysts, bilateral: Secondary | ICD-10-CM | POA: Diagnosis not present

## 2021-03-19 DIAGNOSIS — H35033 Hypertensive retinopathy, bilateral: Secondary | ICD-10-CM | POA: Diagnosis not present

## 2021-03-19 DIAGNOSIS — E119 Type 2 diabetes mellitus without complications: Secondary | ICD-10-CM | POA: Diagnosis not present

## 2021-03-19 DIAGNOSIS — H40013 Open angle with borderline findings, low risk, bilateral: Secondary | ICD-10-CM | POA: Diagnosis not present

## 2021-03-19 DIAGNOSIS — Z961 Presence of intraocular lens: Secondary | ICD-10-CM | POA: Diagnosis not present

## 2021-03-20 ENCOUNTER — Other Ambulatory Visit: Payer: Self-pay | Admitting: Internal Medicine

## 2021-03-20 DIAGNOSIS — I10 Essential (primary) hypertension: Secondary | ICD-10-CM

## 2021-03-21 NOTE — Telephone Encounter (Signed)
Received fax from [facility] that [place] application has been approved. Medication will be shipped to patient.

## 2021-03-26 ENCOUNTER — Other Ambulatory Visit: Payer: Self-pay | Admitting: Internal Medicine

## 2021-03-26 DIAGNOSIS — D509 Iron deficiency anemia, unspecified: Secondary | ICD-10-CM

## 2021-04-10 ENCOUNTER — Other Ambulatory Visit: Payer: Self-pay

## 2021-04-10 ENCOUNTER — Ambulatory Visit (INDEPENDENT_AMBULATORY_CARE_PROVIDER_SITE_OTHER): Payer: HMO | Admitting: Internal Medicine

## 2021-04-10 ENCOUNTER — Encounter: Payer: Self-pay | Admitting: Internal Medicine

## 2021-04-10 VITALS — BP 138/70 | HR 82 | Temp 98.5°F | Ht 66.0 in | Wt 240.0 lb

## 2021-04-10 DIAGNOSIS — K760 Fatty (change of) liver, not elsewhere classified: Secondary | ICD-10-CM

## 2021-04-10 DIAGNOSIS — R002 Palpitations: Secondary | ICD-10-CM

## 2021-04-10 DIAGNOSIS — E785 Hyperlipidemia, unspecified: Secondary | ICD-10-CM

## 2021-04-10 DIAGNOSIS — D508 Other iron deficiency anemias: Secondary | ICD-10-CM | POA: Diagnosis not present

## 2021-04-10 DIAGNOSIS — E039 Hypothyroidism, unspecified: Secondary | ICD-10-CM | POA: Diagnosis not present

## 2021-04-10 DIAGNOSIS — Z0000 Encounter for general adult medical examination without abnormal findings: Secondary | ICD-10-CM | POA: Diagnosis not present

## 2021-04-10 DIAGNOSIS — D510 Vitamin B12 deficiency anemia due to intrinsic factor deficiency: Secondary | ICD-10-CM

## 2021-04-10 DIAGNOSIS — I10 Essential (primary) hypertension: Secondary | ICD-10-CM

## 2021-04-10 DIAGNOSIS — E118 Type 2 diabetes mellitus with unspecified complications: Secondary | ICD-10-CM | POA: Diagnosis not present

## 2021-04-10 DIAGNOSIS — E519 Thiamine deficiency, unspecified: Secondary | ICD-10-CM | POA: Diagnosis not present

## 2021-04-10 LAB — CBC WITH DIFFERENTIAL/PLATELET
Basophils Absolute: 0.1 10*3/uL (ref 0.0–0.1)
Basophils Relative: 0.6 % (ref 0.0–3.0)
Eosinophils Absolute: 0.3 10*3/uL (ref 0.0–0.7)
Eosinophils Relative: 3.6 % (ref 0.0–5.0)
HCT: 32.6 % — ABNORMAL LOW (ref 36.0–46.0)
Hemoglobin: 10.8 g/dL — ABNORMAL LOW (ref 12.0–15.0)
Lymphocytes Relative: 30.2 % (ref 12.0–46.0)
Lymphs Abs: 2.8 10*3/uL (ref 0.7–4.0)
MCHC: 33 g/dL (ref 30.0–36.0)
MCV: 88.2 fl (ref 78.0–100.0)
Monocytes Absolute: 0.9 10*3/uL (ref 0.1–1.0)
Monocytes Relative: 9.5 % (ref 3.0–12.0)
Neutro Abs: 5.2 10*3/uL (ref 1.4–7.7)
Neutrophils Relative %: 56.1 % (ref 43.0–77.0)
Platelets: 368 10*3/uL (ref 150.0–400.0)
RBC: 3.7 Mil/uL — ABNORMAL LOW (ref 3.87–5.11)
RDW: 16.5 % — ABNORMAL HIGH (ref 11.5–15.5)
WBC: 9.2 10*3/uL (ref 4.0–10.5)

## 2021-04-10 LAB — HEPATIC FUNCTION PANEL
ALT: 20 U/L (ref 0–35)
AST: 17 U/L (ref 0–37)
Albumin: 4.2 g/dL (ref 3.5–5.2)
Alkaline Phosphatase: 109 U/L (ref 39–117)
Bilirubin, Direct: 0.1 mg/dL (ref 0.0–0.3)
Total Bilirubin: 0.4 mg/dL (ref 0.2–1.2)
Total Protein: 7.7 g/dL (ref 6.0–8.3)

## 2021-04-10 LAB — LIPID PANEL
Cholesterol: 115 mg/dL (ref 0–200)
HDL: 40.4 mg/dL (ref >39.00)
NonHDL: 74.67
Total CHOL/HDL Ratio: 3
Triglycerides: 232 mg/dL — ABNORMAL HIGH (ref 0.0–149.0)
VLDL: 46.4 mg/dL — ABNORMAL HIGH (ref 0.0–40.0)

## 2021-04-10 LAB — IRON: Iron: 73 ug/dL (ref 42–145)

## 2021-04-10 LAB — FOLATE: Folate: 7.9 ng/mL (ref >5.9)

## 2021-04-10 LAB — PROTIME-INR
INR: 1.1 ratio — ABNORMAL HIGH (ref 0.8–1.0)
Prothrombin Time: 12 s (ref 9.6–13.1)

## 2021-04-10 LAB — LDL CHOLESTEROL, DIRECT: Direct LDL: 42 mg/dL

## 2021-04-10 LAB — FERRITIN: Ferritin: 68.2 ng/mL (ref 10.0–291.0)

## 2021-04-10 LAB — HEMOGLOBIN A1C: Hgb A1c MFr Bld: 6.9 % — ABNORMAL HIGH (ref 4.6–6.5)

## 2021-04-10 LAB — VITAMIN B12: Vitamin B-12: 401 pg/mL (ref 211–911)

## 2021-04-10 NOTE — Progress Notes (Signed)
 Subjective:  Patient ID: Valerie [name], female    DOB: 10/20/1946  Age: 75 y.o. MRN: [id number]  CC: Annual Exam, Anemia, Diabetes, Hyperlipidemia, and Hypertension  This visit occurred during the SARS-CoV-2 public health emergency.  Safety protocols were in place, including screening questions prior to the visit, additional usage of staff PPE, and extensive cleaning of exam room while observing appropriate contact time as indicated for disinfecting solutions.    HPI Nyazia A Bacci presents for a CPX and f/up -   She continues to have a positive review of systems but tells me that there have been no changes or worsening symptoms recently.  She complains of chronic tightness in her abdomen, fatigue, generalized weakness, lightheadedness, dizziness, and palpitations.  She tells me she is seeing a cardiologist soon about the palpitations.  Outpatient Medications Prior to Visit  Medication Sig Dispense Refill  . albuterol (PROVENTIL) (2.5 MG/3ML) 0.083% nebulizer solution     . aspirin 81 MG tablet Take 81 mg by mouth daily.     . atorvastatin (LIPITOR) 20 MG tablet TAKE 1 TABLET BY MOUTH EVERY DAY 90 tablet 1  . budesonide-formoterol (SYMBICORT) 80-4.5 MCG/ACT inhaler Inhale 2 puffs into the lungs as needed.     . carvedilol (COREG) 25 MG tablet Take 1 tablet (25 mg total) by mouth 2 (two) times daily with a meal. 180 tablet 0  . chlorthalidone (HYGROTON) 25 MG tablet Take 1 tablet (25 mg total) by mouth daily. 90 tablet 0  . Cholecalciferol 50 MCG (2000 UT) TABS Take 1 tablet (2,000 Units total) by mouth daily. 90 tablet 1  . clobetasol ointment (TEMOVATE) 0.05 % Apply 1 application topically 2 (two) times daily. 60 g 1  . Dulaglutide (TRULICITY) 1.5 MG/0.5ML SOPN Inject 0.5 mLs (1.5 mg total) into the skin once a week. 4 pen 0  . Empagliflozin-metFORMIN HCl (SYNJARDY) 12.5-500 MG TABS Take 1 tablet by mouth 2 (two) times daily. 180 tablet 3  . famotidine (PEPCID) 40 MG tablet Take 1  tablet (40 mg total) by mouth daily. 90 tablet 1  . ferrous sulfate 325 (65 FE) MG tablet TAKE 1 TABLET BY MOUTH TWICE DAILY WITH A MEAL 180 tablet 0  . gabapentin (NEURONTIN) 300 MG capsule Take 1 capsule (300 mg total) by mouth 3 (three) times daily. 270 capsule 1  . glucose blood (FREESTYLE TEST STRIPS) test strip USE TO TEST BLOOD SUGAR 3 TIMES A DAY. DX: E11.8 300 strip 1  . Insulin Pen Needle 31G X 5 MM MISC Use once daily with insulin 100 each 3  . irbesartan (AVAPRO) 300 MG tablet TAKE 0.5 TABLETS (150 MG TOTAL) BY MOUTH DAILY. 45 tablet 1  . latanoprost (XALATAN) 0.005 % ophthalmic solution Place 1 drop into both eyes at bedtime.     . MYRBETRIQ 25 MG TB24 tablet TAKE 1 TABLET BY MOUTH EVERY DAY 90 tablet 0  . nystatin ointment (MYCOSTATIN) APPLY ON THE SKIN THREE TIMES DAILY    . oxyCODONE-acetaminophen (PERCOCET/ROXICET) 5-325 MG tablet Take 1 tablet by mouth every 8 (eight) hours as needed for severe pain. 90 tablet 0  . pantoprazole (PROTONIX) 40 MG tablet Take 1 tablet (40 mg total) by mouth 2 (two) times daily before a meal. 180 tablet 1  . potassium chloride SA (KLOR-CON M20) 20 MEQ tablet TAKE 1 TABLET BY MOUTH TWICE A DAY 180 tablet 1  . Vilazodone HCl (VIIBRYD STARTER PACK) 10 & 20 MG KIT Take 1 tablet by mouth daily with   breakfast. 1 kit 0  . albuterol (VENTOLIN HFA) 108 (90 Base) MCG/ACT inhaler Inhale 2 puffs into the lungs every 6 (six) hours as needed. For shortness of breath (Patient not taking: Reported on 04/10/2021) 3 Inhaler 4   No facility-administered medications prior to visit.    ROS Review of Systems  Constitutional: Positive for fatigue. Negative for appetite change, chills, diaphoresis, fever and unexpected weight change.  HENT: Negative.   Eyes: Negative.   Respiratory: Negative for cough, chest tightness, shortness of breath and wheezing.   Cardiovascular: Positive for palpitations. Negative for chest pain and leg swelling.  Gastrointestinal: Positive for  abdominal pain. Negative for constipation, diarrhea, nausea and vomiting.  Endocrine: Negative.   Genitourinary: Negative.  Negative for difficulty urinating.  Musculoskeletal: Negative.  Negative for myalgias.  Skin: Negative.   Neurological: Negative.  Negative for dizziness, weakness, light-headedness and numbness.  Hematological: Negative for adenopathy. Does not bruise/bleed easily.  Psychiatric/Behavioral: Negative.     Objective:  BP 138/70 (BP Location: Left Arm, Patient Position: Sitting, Cuff Size: Large)   Pulse 82   Temp 98.5 F (36.9 C) (Oral)   Ht 5' 6" (1.676 m)   Wt 240 lb (108.9 kg)   SpO2 98%   BMI 38.74 kg/m   BP Readings from Last 3 Encounters:  04/10/21 138/70  02/27/21 (!) 170/80  02/06/21 136/84    Wt Readings from Last 3 Encounters:  04/10/21 240 lb (108.9 kg)  02/27/21 245 lb 6.4 oz (111.3 kg)  02/06/21 243 lb (110.2 kg)    Physical Exam Vitals reviewed.  Constitutional:      Appearance: She is obese. She is not ill-appearing.  HENT:     Nose: Nose normal.     Mouth/Throat:     Mouth: Mucous membranes are moist.  Eyes:     General: No scleral icterus.    Conjunctiva/sclera: Conjunctivae normal.  Cardiovascular:     Rate and Rhythm: Normal rate.     Heart sounds: No murmur heard.   Pulmonary:     Effort: Pulmonary effort is normal.     Breath sounds: No stridor. No wheezing, rhonchi or rales.  Abdominal:     General: Abdomen is protuberant. Bowel sounds are normal. There is no distension.     Palpations: Abdomen is soft. There is no hepatomegaly, splenomegaly or mass.     Tenderness: There is no abdominal tenderness.  Musculoskeletal:        General: Normal range of motion.     Cervical back: Neck supple.     Right lower leg: No edema.     Left lower leg: No edema.  Skin:    General: Skin is warm and dry.     Coloration: Skin is not pale.  Neurological:     General: No focal deficit present.     Mental Status: She is alert.   Psychiatric:        Mood and Affect: Mood normal.        Behavior: Behavior normal.     Lab Results  Component Value Date   WBC 9.2 04/10/2021   HGB 10.8 (L) 04/10/2021   HCT 32.6 (L) 04/10/2021   PLT 368.0 04/10/2021   GLUCOSE 106 (H) 02/10/2021   CHOL 115 04/10/2021   TRIG 232.0 (H) 04/10/2021   HDL 40.40 04/10/2021   LDLDIRECT 42.0 04/10/2021   LDLCALC 43 03/28/2014   ALT 20 04/10/2021   AST 17 04/10/2021   NA 139 02/10/2021   K 3.6  02/10/2021   CL 98 02/10/2021   CREATININE 0.93 02/10/2021   BUN 11 02/10/2021   CO2 30 02/10/2021   TSH 1.64 02/06/2021   INR 1.1 (H) 04/10/2021   HGBA1C 6.9 (H) 04/10/2021   MICROALBUR 0.5 07/01/2020    MR ANKLE RIGHT WO CONTRAST  Result Date: 01/29/2021 CLINICAL DATA:  Onset right heel pain in October, 2021. No known injury. EXAM: MRI OF THE RIGHT ANKLE WITHOUT CONTRAST TECHNIQUE: Multiplanar, multisequence MR imaging of the ankle was performed. No intravenous contrast was administered. COMPARISON:  Plain films right ankle 10/31/2020 FINDINGS: TENDONS Peroneal: Intact.  Mild appearing peroneus brevis tendinosis noted. Posteromedial: Intact. Anterior: Intact. Achilles: Intact. Plantar Fascia: The medial cord is thickened and edematous and partially ruptured. Calcaneal spur noted. LIGAMENTS Lateral: Intact. The anterior talofibular ligament is attenuated compatible with remote sprain. Medial: Intact. CARTILAGE Ankle Joint: No joint effusion or osteochondral lesion of the talar dome. Subtalar Joints/Sinus Tarsi: Normal. Bones: No fracture, stress change or worrisome lesion. Other: None. 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 Mild appearing tendinosis of the peroneus brevis. Remote appearing sprain of the anterior talofibular ligament without frank tear. Electronically Signed   By: Thomas  Dalessio M.D.   On: 01/29/2021 12:41    Assessment & Plan:   [place] was seen today for annual  exam, anemia, diabetes, hyperlipidemia and hypertension.  Diagnoses and all orders for this visit:  Essential hypertension, benign- Her BP is well controlled.  Hypothyroidism, unspecified type  Type II diabetes mellitus with manifestations (HCC)- Her blood sugar is adequately well controlled. -     Hemoglobin A1c; Future -     Hemoglobin A1c  Fatty liver disease, nonalcoholic- Her LFTs are normal now. -     Hepatic function panel; Future -     Protime-INR; Future -     Protime-INR -     Hepatic function panel  Vitamin B12 deficiency anemia due to intrinsic factor deficiency- Her H/H remain low. I will monitor her B12 and folate levels. -     CBC with Differential/Platelet; Future -     Vitamin B12; Future -     Folate; Future -     Folate -     Vitamin B12 -     CBC with Differential/Platelet  Hyperlipidemia with target LDL less than 100- LDL goal achieved. Doing well on the statin -     Lipid panel; Future -     Lipid panel  Palpitations  Routine general medical examination at a health care facility- Exam completed, labs reviewed, vaccines reviewed, cancer screenings are UTD.  Thiamine deficiency, unspecified- She remains anemic. I will monitor her B1 level. -     Vitamin B1; Future -     Vitamin B1  Iron deficiency anemia secondary to inadequate dietary iron intake- Her H/H remain low. I will monitor her iron level. -     CBC with Differential/Platelet; Future -     Ferritin; Future -     Iron; Future -     Iron -     Ferritin -     CBC with Differential/Platelet  Other orders -     LDL cholesterol, direct   I am having Preslie A. Kloc maintain her latanoprost, aspirin, albuterol, albuterol, Insulin Pen Needle, nystatin ointment, Trulicity, Myrbetriq, Synjardy, Cholecalciferol, clobetasol ointment, budesonide-formoterol, atorvastatin, gabapentin, carvedilol, famotidine, FREESTYLE TEST STRIPS, oxyCODONE-acetaminophen, pantoprazole, chlorthalidone, Viibryd Starter  Pack, potassium chloride SA, irbesartan, and ferrous sulfate.  No   orders of the defined types were placed in this encounter.    Follow-up: Return in about 3 months (around 07/10/2021).  Thomas Jones, MD 

## 2021-04-10 NOTE — Patient Instructions (Signed)
Health Maintenance, Female Adopting a healthy lifestyle and getting preventive care are important in promoting health and wellness. Ask your health care provider about:  The right schedule for you to have regular tests and exams.  Things you can do on your own to prevent diseases and keep yourself healthy. What should I know about diet, weight, and exercise? Eat a healthy diet  Eat a diet that includes plenty of vegetables, fruits, low-fat dairy products, and lean protein.  Do not eat a lot of foods that are high in solid fats, added sugars, or sodium.   Maintain a healthy weight Body mass index (BMI) is used to identify weight problems. It estimates body fat based on height and weight. Your health care provider can help determine your BMI and help you achieve or maintain a healthy weight. Get regular exercise Get regular exercise. This is one of the most important things you can do for your health. Most adults should:  Exercise for at least 150 minutes each week. The exercise should increase your heart rate and make you sweat (moderate-intensity exercise).  Do strengthening exercises at least twice a week. This is in addition to the moderate-intensity exercise.  Spend less time sitting. Even light physical activity can be beneficial. Watch cholesterol and blood lipids Have your blood tested for lipids and cholesterol at 73 years of age, then have this test every 5 years. Have your cholesterol levels checked more often if:  Your lipid or cholesterol levels are high.  You are older than 73 years of age.  You are at high risk for heart disease. What should I know about cancer screening? Depending on your health history and family history, you may need to have cancer screening at various ages. This may include screening for:  Breast cancer.  Cervical cancer.  Colorectal cancer.  Skin cancer.  Lung cancer. What should I know about heart disease, diabetes, and high blood  pressure? Blood pressure and heart disease  High blood pressure causes heart disease and increases the risk of stroke. This is more likely to develop in people who have high blood pressure readings, are of African descent, or are overweight.  Have your blood pressure checked: ? Every 3-5 years if you are 18-39 years of age. ? Every year if you are 40 years old or older. Diabetes Have regular diabetes screenings. This checks your fasting blood sugar level. Have the screening done:  Once every three years after age 40 if you are at a normal weight and have a low risk for diabetes.  More often and at a younger age if you are overweight or have a high risk for diabetes. What should I know about preventing infection? Hepatitis B If you have a higher risk for hepatitis B, you should be screened for this virus. Talk with your health care provider to find out if you are at risk for hepatitis B infection. Hepatitis C Testing is recommended for:  Everyone born from 1945 through 1965.  Anyone with known risk factors for hepatitis C. Sexually transmitted infections (STIs)  Get screened for STIs, including gonorrhea and chlamydia, if: ? You are sexually active and are younger than 73 years of age. ? You are older than 73 years of age and your health care provider tells you that you are at risk for this type of infection. ? Your sexual activity has changed since you were last screened, and you are at increased risk for chlamydia or gonorrhea. Ask your health care provider   if you are at risk.  Ask your health care provider about whether you are at high risk for HIV. Your health care provider may recommend a prescription medicine to help prevent HIV infection. If you choose to take medicine to prevent HIV, you should first get tested for HIV. You should then be tested every 3 months for as long as you are taking the medicine. Pregnancy  If you are about to stop having your period (premenopausal) and  you may become pregnant, seek counseling before you get pregnant.  Take 400 to 800 micrograms (mcg) of folic acid every day if you become pregnant.  Ask for birth control (contraception) if you want to prevent pregnancy. Osteoporosis and menopause Osteoporosis is a disease in which the bones lose minerals and strength with aging. This can result in bone fractures. If you are 65 years old or older, or if you are at risk for osteoporosis and fractures, ask your health care provider if you should:  Be screened for bone loss.  Take a calcium or vitamin D supplement to lower your risk of fractures.  Be given hormone replacement therapy (HRT) to treat symptoms of menopause. Follow these instructions at home: Lifestyle  Do not use any products that contain nicotine or tobacco, such as cigarettes, e-cigarettes, and chewing tobacco. If you need help quitting, ask your health care provider.  Do not use street drugs.  Do not share needles.  Ask your health care provider for help if you need support or information about quitting drugs. Alcohol use  Do not drink alcohol if: ? Your health care provider tells you not to drink. ? You are pregnant, may be pregnant, or are planning to become pregnant.  If you drink alcohol: ? Limit how much you use to 0-1 drink a day. ? Limit intake if you are breastfeeding.  Be aware of how much alcohol is in your drink. In the [place] one drink equals one 12 oz bottle of beer (355 mL), one 5 oz glass of wine (148 mL), or one 1 oz glass of hard liquor (44 mL). General instructions  Schedule regular health, dental, and eye exams.  Stay current with your vaccines.  Tell your health care provider if: ? You often feel depressed. ? You have ever been abused or do not feel safe at home. Summary  Adopting a healthy lifestyle and getting preventive care are important in promoting health and wellness.  Follow your health care provider's instructions about healthy  diet, exercising, and getting tested or screened for diseases.  Follow your health care provider's instructions on monitoring your cholesterol and blood pressure. This information is not intended to replace advice given to you by your health care provider. Make sure you discuss any questions you have with your health care provider. Document Revised: 11/23/2018 Document Reviewed: 11/23/2018 Elsevier Patient Education  2021 Elsevier Inc.  

## 2021-04-14 DIAGNOSIS — M79671 Pain in right foot: Secondary | ICD-10-CM | POA: Diagnosis not present

## 2021-04-14 DIAGNOSIS — E1351 Other specified diabetes mellitus with diabetic peripheral angiopathy without gangrene: Secondary | ICD-10-CM | POA: Diagnosis not present

## 2021-04-14 DIAGNOSIS — M898X7 Other specified disorders of bone, ankle and foot: Secondary | ICD-10-CM | POA: Diagnosis not present

## 2021-04-14 DIAGNOSIS — M722 Plantar fascial fibromatosis: Secondary | ICD-10-CM | POA: Diagnosis not present

## 2021-04-14 DIAGNOSIS — M79672 Pain in left foot: Secondary | ICD-10-CM | POA: Diagnosis not present

## 2021-04-15 DIAGNOSIS — E1351 Other specified diabetes mellitus with diabetic peripheral angiopathy without gangrene: Secondary | ICD-10-CM | POA: Diagnosis not present

## 2021-04-16 LAB — VITAMIN B1: Vitamin B1 (Thiamine): 8 nmol/L (ref 8–30)

## 2021-04-17 ENCOUNTER — Encounter: Payer: Self-pay | Admitting: Internal Medicine

## 2021-04-28 DIAGNOSIS — M722 Plantar fascial fibromatosis: Secondary | ICD-10-CM | POA: Diagnosis not present

## 2021-04-30 ENCOUNTER — Other Ambulatory Visit: Payer: Self-pay

## 2021-04-30 ENCOUNTER — Telehealth: Payer: Self-pay | Admitting: Pharmacist

## 2021-04-30 ENCOUNTER — Ambulatory Visit (INDEPENDENT_AMBULATORY_CARE_PROVIDER_SITE_OTHER): Payer: HMO | Admitting: Physician Assistant

## 2021-04-30 ENCOUNTER — Encounter: Payer: Self-pay | Admitting: Physician Assistant

## 2021-04-30 VITALS — BP 118/72 | HR 81 | Ht 66.0 in | Wt 237.4 lb

## 2021-04-30 DIAGNOSIS — E785 Hyperlipidemia, unspecified: Secondary | ICD-10-CM

## 2021-04-30 DIAGNOSIS — R0789 Other chest pain: Secondary | ICD-10-CM

## 2021-04-30 DIAGNOSIS — R002 Palpitations: Secondary | ICD-10-CM

## 2021-04-30 DIAGNOSIS — E119 Type 2 diabetes mellitus without complications: Secondary | ICD-10-CM | POA: Diagnosis not present

## 2021-04-30 DIAGNOSIS — I10 Essential (primary) hypertension: Secondary | ICD-10-CM | POA: Diagnosis not present

## 2021-04-30 NOTE — Patient Instructions (Signed)
Medication Instructions:  Your physician recommends that you continue on your current medications as directed. Please refer to the Current Medication list given to you today.  *If you need a refill on your cardiac medications before your next appointment, please call your pharmacy*  Lab Work: NONE ordered at this time of appointment   If you have labs (blood work) drawn today and your tests are completely normal, you will receive your results only by: . MyChart Message (if you have MyChart) OR . A paper copy in the mail If you have any lab test that is abnormal or we need to change your treatment, we will call you to review the results.  Testing/Procedures: NONE ordered at this time of appointment   Follow-Up: At [facility], you and your health needs are our priority.  As part of our continuing mission to provide you with exceptional heart care, we have created designated Provider Care Teams.  These Care Teams include your primary Cardiologist (physician) and Advanced Practice Providers (APPs -  Physician Assistants and Nurse Practitioners) who all work together to provide you with the care you need, when you need it.  We recommend signing up for the patient portal called "MyChart".  Sign up information is provided on this After Visit Summary.  MyChart is used to connect with patients for Virtual Visits (Telemedicine).  Patients are able to view lab/test results, encounter notes, upcoming appointments, etc.  Non-urgent messages can be sent to your provider as well.   To learn more about what you can do with MyChart, go to [email or web address].    Your next appointment:   6 month(s)  The format for your next appointment:   In Person  Provider:   Jonathan Berry, MD  Other Instructions   

## 2021-04-30 NOTE — Progress Notes (Signed)
Cardiology Office Note:    Date:  05/02/2021   ID:  Valerie [name], DOB 1945-12-18, MRN [id number]  PCP:  Janith Lima, MD   [place] Providers Cardiologist:  Quay Burow, MD {    Referring MD: Janith Lima, MD   Chief Complaint  Patient presents with  . Follow-up    Seen for Dr. Gwenlyn Found    History of Present Illness:    Valerie [name] is a 75 y.o. female with a hx of HTN, HLD, DM 2, hypothyroidism, OSA on BiPAP therapy, obesity and history of palpitation.  Previous echocardiogram obtained on 10/04/2018 showed EF 60 to 65%, grade 1 DD, no significant valve issue.  Myoview obtained on 10/07/2018 showed EF 75%, normal perfusion, overall low risk study. She does have significant family history of CAD with her father and brother died of MI, however she herself has never had a heart attack or stroke.  She does have chronic chest pain once every few days and chronically short of breath.  It was suspected her dyspnea is related to reactive airway disease. Due to symptom of palpitation, she wore a heart monitor in October 2018 that showed sinus rhythm, occasional PVC and PACs.  Due to recurrent palpitation, a longer event monitor was placed in March 2021 that showed occasional PVCs and short atrial runs.  Patient was seen by his PCP in June 2021 for intermittent sharp pain with belching and dysphagia.  This was felt to be GI in nature.  High-sensitivity troponin obtained was negative.  Patient was last seen by Dr. Gwenlyn Found on 10/29/2020 at which time she was doing well with only occasional palpitation and atypical chest pain.  Patient presents today for follow-up.  She denies any changes to her recent symptoms.  She still has occasional chest discomfort or palpitation, degree, frequency and the duration has not changed.  She was recently seen by Dr. Ronnald Ramp in April, lab work obtained at that time showed well-controlled the total cholesterol, HDL and LDL.  Her triglycerides remain borderline  elevated.  I recommended continue on the current therapy.  Blood pressure and heart rate are very well controlled.  She can follow-up in 6 months with Dr. Gwenlyn Found.   Past Medical History:  Diagnosis Date  . Anemia   . Anxiety and depression   . Arthritis   . Asthma   . Cataract   . Degenerative arthritis   . Depression   . Diabetes mellitus, type 2 ([place])   . Fatigue   . GERD (gastroesophageal reflux disease)   . Glaucoma   . Heart palpitations   . Hemorrhoids   . Hyperlipidemia   . Hypertension   . Hypothyroidism   . Hypoxemia 11/24/2013  . Memory deficit 10/04/2013  . Obesity   . Sleep apnea    BiPAP  . Snoring disorder     Past Surgical History:  Procedure Laterality Date  . benign tumors resected    . CATARACT EXTRACTION Left   . FOOT SURGERY Left    bone spur  . REFRACTIVE SURGERY     Glaucoma  . ROTATOR CUFF REPAIR Left   . TUBAL LIGATION      Current Medications: Current Meds  Medication Sig  . albuterol (PROVENTIL) (2.5 MG/3ML) 0.083% nebulizer solution   . albuterol (VENTOLIN HFA) 108 (90 Base) MCG/ACT inhaler Inhale 2 puffs into the lungs every 6 (six) hours as needed. For shortness of breath  . aspirin 81 MG tablet Take 81 mg by  mouth daily.   [name] atorvastatin (LIPITOR) 20 MG tablet TAKE 1 TABLET BY MOUTH EVERY DAY  . budesonide-formoterol (SYMBICORT) 80-4.5 MCG/ACT inhaler Inhale 2 puffs into the lungs as needed.   . carvedilol (COREG) 25 MG tablet Take 1 tablet (25 mg total) by mouth 2 (two) times daily with a meal.  . chlorthalidone (HYGROTON) 25 MG tablet Take 1 tablet (25 mg total) by mouth daily.  . Cholecalciferol 50 MCG (2000 UT) TABS Take 1 tablet (2,000 Units total) by mouth daily.  . clobetasol ointment (TEMOVATE) 1.47 % Apply 1 application topically 2 (two) times daily.  . Dulaglutide (TRULICITY) 1.5 WG/9.5AO SOPN Inject 0.5 mLs (1.5 mg total) into the skin once a week.  . Empagliflozin-metFORMIN HCl (SYNJARDY) 12.5-500 MG TABS Take 1 tablet by  mouth 2 (two) times daily.  . famotidine (PEPCID) 40 MG tablet Take 1 tablet (40 mg total) by mouth daily.  . ferrous sulfate 325 (65 FE) MG tablet TAKE 1 TABLET BY MOUTH TWICE DAILY WITH A MEAL  . gabapentin (NEURONTIN) 300 MG capsule Take 1 capsule (300 mg total) by mouth 3 (three) times daily.  [name] glucose blood (FREESTYLE TEST STRIPS) test strip USE TO TEST BLOOD SUGAR 3 TIMES A DAY. DX: E11.8  . Insulin Pen Needle 31G X 5 MM MISC Use once daily with insulin  . irbesartan (AVAPRO) 300 MG tablet TAKE 0.5 TABLETS (150 MG TOTAL) BY MOUTH DAILY.  [name] latanoprost (XALATAN) 0.005 % ophthalmic solution Place 1 drop into both eyes at bedtime.   [name] nystatin ointment (MYCOSTATIN) APPLY ON THE SKIN THREE TIMES DAILY  . oxyCODONE-acetaminophen (PERCOCET/ROXICET) 5-325 MG tablet Take 1 tablet by mouth every 8 (eight) hours as needed for severe pain.  . pantoprazole (PROTONIX) 40 MG tablet Take 1 tablet (40 mg total) by mouth 2 (two) times daily before a meal.  . potassium chloride SA (KLOR-CON M20) 20 MEQ tablet TAKE 1 TABLET BY MOUTH TWICE A DAY  . [DISCONTINUED] MYRBETRIQ 25 MG TB24 tablet TAKE 1 TABLET BY MOUTH EVERY DAY  . [DISCONTINUED] Vilazodone HCl (VIIBRYD STARTER PACK) 10 & 20 MG KIT Take 1 tablet by mouth daily with breakfast.     Allergies:   Food and Penicillins   Social History   Socioeconomic History  . Marital status: Widowed    Spouse name: Not on file  . Number of children: 3  . Years of education: 11th  . Highest education level: 11th grade  Occupational History  . Occupation: disabled  Tobacco Use  . Smoking status: Never Smoker  . Smokeless tobacco: Never Used  Vaping Use  . Vaping Use: Never used  Substance and Sexual Activity  . Alcohol use: No    Alcohol/week: 0.0 standard drinks  . Drug use: No  . Sexual activity: Not Currently    Comment: not working, lives with husband, 3 sons  Other Topics Concern  . Not on file  Social History Narrative   Client's spouse  recently passed Feb 2021    also caregiver for son who is 83 yo and is disabled.   Social Determinants of Health   Financial Resource Strain: Low Risk   . Difficulty of Paying Living Expenses: Not hard at all  Food Insecurity: No Food Insecurity  . Worried About Charity fundraiser in the Last Year: Never true  . Ran Out of Food in the Last Year: Never true  Transportation Needs: Not on file  Physical Activity: Inactive  . Days of Exercise per  Week: 0 days  . Minutes of Exercise per Session: 0 min  Stress: Stress Concern Present  . Feeling of Stress : Rather much  Social Connections: Moderately Integrated  . Frequency of Communication with Friends and Family: More than three times a week  . Frequency of Social Gatherings with Friends and Family: Once a week  . Attends Religious Services: 1 to 4 times per year  . Active Member of Clubs or Organizations: No  . Attends Archivist Meetings: 1 to 4 times per year  . Marital Status: Widowed     Family History: The patient's family history includes Alcohol abuse in her mother and another family member; Atrial fibrillation in her sister; Cerebral palsy in her son; Colon cancer (age of onset: 18) in her brother; Coronary artery disease in her brother; Dementia in her brother; Diabetes in her son and son; Heart attack in her brother, father, and mother; Heart disease in her sister; Hyperlipidemia in her brother, brother, brother, father, sister, son, son, and son; Hypertension in her brother, brother, brother, father, sister, son, son, son, and another family member.  ROS:   Please see the history of present illness.     All other systems reviewed and are negative.  EKGs/Labs/Other Studies Reviewed:    The following studies were reviewed today:  Echo 10/03/2018 LV EF: 60% -  65%   -------------------------------------------------------------------  Indications:   Chest Pain (R07.89).    -------------------------------------------------------------------  History:  PMH: OSA. Dyspnea. Risk factors: Hypertension.  Diabetes mellitus.   -------------------------------------------------------------------  Study Conclusions   - Left ventricle: The cavity size was normal. Wall thickness was  increased in a pattern of mild LVH. Systolic function was normal.  The estimated ejection fraction was in the range of 60% to 65%.  Wall motion was normal; there were no regional wall motion  abnormalities. Doppler parameters are consistent with abnormal  left ventricular relaxation (grade 1 diastolic dysfunction).  Doppler parameters are consistent with indeterminate ventricular  filling pressure.  - Aortic valve: Transvalvular velocity was within the normal range.  There was no stenosis. There was no regurgitation.  - Mitral valve: Transvalvular velocity was within the normal range.  There was no evidence for stenosis. There was no regurgitation.  - Right ventricle: The cavity size was normal. Wall thickness was  normal. Systolic function was normal.  - Atrial septum: No defect or patent foramen ovale was identified  by color flow Doppler.  - Tricuspid valve: There was no regurgitation.  - Global longitudinal strain -24.6% (normal).   EKG:  EKG is not ordered today.   Recent Labs: 02/06/2021: TSH 1.64 02/10/2021: BUN 11; Creatinine, Ser 0.93; Potassium 3.6; Sodium 139 04/10/2021: ALT 20; Hemoglobin 10.8; Platelets 368.0  Recent Lipid Panel    Component Value Date/Time   CHOL 115 04/10/2021 1413   TRIG 232.0 (H) 04/10/2021 1413   HDL 40.40 04/10/2021 1413   CHOLHDL 3 04/10/2021 1413   VLDL 46.4 (H) 04/10/2021 1413   LDLCALC 43 03/28/2014 1511   LDLDIRECT 42.0 04/10/2021 1413     Risk Assessment/Calculations:       Physical Exam:    VS:  BP 118/72   Pulse 81   Ht $R'5\' 6"'fl$  (1.676 m)   Wt 237 lb 6.4 oz (107.7 kg)   SpO2 98%   BMI 38.32 kg/m      Wt Readings from Last 3 Encounters:  04/30/21 237 lb 6.4 oz (107.7 kg)  04/10/21 240 lb (108.9 kg)  02/27/21 245  lb 6.4 oz (111.3 kg)     GEN:  Well nourished, well developed in no acute distress HEENT: Normal NECK: No JVD; No carotid bruits LYMPHATICS: No lymphadenopathy CARDIAC: RRR, no murmurs, rubs, gallops RESPIRATORY:  Clear to auscultation without rales, wheezing or rhonchi  ABDOMEN: Soft, non-tender, non-distended MUSCULOSKELETAL:  No edema; No deformity  SKIN: Warm and dry NEUROLOGIC:  Alert and oriented x 3 PSYCHIATRIC:  Normal affect   ASSESSMENT:    1. Palpitations   2. Atypical chest pain   3. Primary hypertension   4. Hyperlipidemia LDL goal <70   5. Controlled type 2 diabetes mellitus without complication, without long-term current use of insulin (HCC)    PLAN:    In order of problems listed above:  1. Palpitation: Symptom is well controlled on carvedilol 25 mg twice a day  2. History of atypical chest pain: Does not occur with physical exertion.  This has been going on for years without increasing frequency or duration.  Previous work-up has been negative.  No further work-up unless there is change in the characteristic of the chest pain  3. Hypertension: Continue on current therapy  4. Hyperlipidemia: Previous lipid panel showed borderline elevated triglyceride, will keep her on 20 mg of Lipitor  5. DM2: Managed by primary care provider.        Medication Adjustments/Labs and Tests Ordered: Current medicines are reviewed at length with the patient today.  Concerns regarding medicines are outlined above.  No orders of the defined types were placed in this encounter.  No orders of the defined types were placed in this encounter.   Patient Instructions  Medication Instructions:  Your physician recommends that you continue on your current medications as directed. Please refer to the Current Medication list given to you today.  *If you need a  refill on your cardiac medications before your next appointment, please call your pharmacy*  Lab Work: NONE ordered at this time of appointment   If you have labs (blood work) drawn today and your tests are completely normal, you will receive your results only by: [name] MyChart Message (if you have MyChart) OR . A paper copy in the mail If you have any lab test that is abnormal or we need to change your treatment, we will call you to review the results.  Testing/Procedures: NONE ordered at this time of appointment   Follow-Up: At [facility], you and your health needs are our priority.  As part of our continuing mission to provide you with exceptional heart care, we have created designated Provider Care Teams.  These Care Teams include your primary Cardiologist (physician) and Advanced Practice Providers (APPs -  Physician Assistants and Nurse Practitioners) who all work together to provide you with the care you need, when you need it.  We recommend signing up for the patient portal called "MyChart".  Sign up information is provided on this After Visit Summary.  MyChart is used to connect with patients for Virtual Visits (Telemedicine).  Patients are able to view lab/test results, encounter notes, upcoming appointments, etc.  Non-urgent messages can be sent to your provider as well.   To learn more about what you can do with MyChart, go to [email or web address].    Your next appointment:   6 month(s)  The format for your next appointment:   In Person  Provider:   Quay Burow, MD   Other Instructions      Signed, Almyra Deforest, [place]  05/02/2021 5:35 PM  [facility]

## 2021-05-01 NOTE — Progress Notes (Addendum)
Chronic Care Management Pharmacy Assistant   Name: Valerie [name]  MRN: [id number] DOB: [birth year]    Reason for Encounter: Diabetic Disease State Call   Conditions to be addressed/monitored: DMII   Recent office visits:  04/10/21 [name]  Recent consult visits:  None ID  Hospital visits:  None in previous 6 months  Medications: Outpatient Encounter Medications as of 04/30/2021  Medication Sig   albuterol (PROVENTIL) (2.5 MG/3ML) 0.083% nebulizer solution    albuterol (VENTOLIN HFA) 108 (90 Base) MCG/ACT inhaler Inhale 2 puffs into the lungs every 6 (six) hours as needed. For shortness of breath   aspirin 81 MG tablet Take 81 mg by mouth daily.    atorvastatin (LIPITOR) 20 MG tablet TAKE 1 TABLET BY MOUTH EVERY DAY   budesonide-formoterol (SYMBICORT) 80-4.5 MCG/ACT inhaler Inhale 2 puffs into the lungs as needed.    carvedilol (COREG) 25 MG tablet Take 1 tablet (25 mg total) by mouth 2 (two) times daily with a meal.   chlorthalidone (HYGROTON) 25 MG tablet Take 1 tablet (25 mg total) by mouth daily.   Cholecalciferol 50 MCG (2000 UT) TABS Take 1 tablet (2,000 Units total) by mouth daily.   clobetasol ointment (TEMOVATE) 7.03 % Apply 1 application topically 2 (two) times daily.   Dulaglutide (TRULICITY) 1.5 JK/0.9FG SOPN Inject 0.5 mLs (1.5 mg total) into the skin once a week.   Empagliflozin-metFORMIN HCl (SYNJARDY) 12.5-500 MG TABS Take 1 tablet by mouth 2 (two) times daily.   famotidine (PEPCID) 40 MG tablet Take 1 tablet (40 mg total) by mouth daily.   ferrous sulfate 325 (65 FE) MG tablet TAKE 1 TABLET BY MOUTH TWICE DAILY WITH A MEAL   gabapentin (NEURONTIN) 300 MG capsule Take 1 capsule (300 mg total) by mouth 3 (three) times daily.   glucose blood (FREESTYLE TEST STRIPS) test strip USE TO TEST BLOOD SUGAR 3 TIMES A DAY. DX: E11.8   Insulin Pen Needle 31G X 5 MM MISC Use once daily with insulin   irbesartan (AVAPRO) 300 MG tablet TAKE 0.5 TABLETS (150 MG  TOTAL) BY MOUTH DAILY.   latanoprost (XALATAN) 0.005 % ophthalmic solution Place 1 drop into both eyes at bedtime.    nystatin ointment (MYCOSTATIN) APPLY ON THE SKIN THREE TIMES DAILY   oxyCODONE-acetaminophen (PERCOCET/ROXICET) 5-325 MG tablet Take 1 tablet by mouth every 8 (eight) hours as needed for severe pain.   pantoprazole (PROTONIX) 40 MG tablet Take 1 tablet (40 mg total) by mouth 2 (two) times daily before a meal.   potassium chloride SA (KLOR-CON M20) 20 MEQ tablet TAKE 1 TABLET BY MOUTH TWICE A DAY   No facility-administered encounter medications on file as of 04/30/2021.    Recent Relevant Labs: Lab Results  Component Value Date/Time   HGBA1C 6.9 (H) 04/10/2021 02:13 PM   HGBA1C 7.0 (H) 02/10/2021 12:42 PM   MICROALBUR 0.5 07/01/2020 11:43 AM   MICROALBUR 0.8 05/04/2019 12:12 PM    Kidney Function Lab Results  Component Value Date/Time   CREATININE 0.93 02/10/2021 12:42 PM   CREATININE 0.91 05/27/2020 01:59 PM   GFR 61.22 02/10/2021 12:42 PM   GFRNONAA >60 08/29/2015 06:30 AM   GFRAA >60 08/29/2015 06:30 AM    Current antihyperglycemic regimen:  Trulicity 1.5 HW/2.9HB inject once week Synjardy 12.5-500 mg 1 tab twice daily  What recent interventions/DTPs have been made to improve glycemic control: None ID  Have there been any recent hospitalizations or ED visits since last visit with [facility]?  No   Patient denies hypoglycemic symptoms, including None   Patient denies hyperglycemic symptoms, including none   How often are you checking your blood sugar? once daily, or sometimes twice depends on how she is felling  What are your blood sugars ranging? 98-180 Fasting: 148 Before meals:  After meals: 132 Bedtime:  During the week, how often does your blood glucose drop below 70? Never   Are you checking your feet daily/regularly? Patient states that she does not have any swelling or sores not healing but she is dealing with pain in her feet. She states that she gets  a shot in both feet.  Adherence Review: Is the patient currently on a STATIN medication? Yes Is the patient currently on ACE/ARB medication? Yes Does the patient have >5 day gap between last estimated fill dates? No   Star Rating Drugs: Atorvastatin 02/04/21 90 d Irbesartan 03/20/21 90 ds  Ethelene Hal Clinical Pharmacist Assistant [phone]  Time spent:47

## 2021-05-02 ENCOUNTER — Encounter: Payer: Self-pay | Admitting: Physician Assistant

## 2021-05-03 ENCOUNTER — Other Ambulatory Visit: Payer: Self-pay | Admitting: Internal Medicine

## 2021-05-03 DIAGNOSIS — E785 Hyperlipidemia, unspecified: Secondary | ICD-10-CM

## 2021-05-06 ENCOUNTER — Ambulatory Visit: Payer: HMO | Admitting: Internal Medicine

## 2021-05-07 ENCOUNTER — Other Ambulatory Visit: Payer: Self-pay | Admitting: Internal Medicine

## 2021-05-07 DIAGNOSIS — E118 Type 2 diabetes mellitus with unspecified complications: Secondary | ICD-10-CM

## 2021-05-07 DIAGNOSIS — I10 Essential (primary) hypertension: Secondary | ICD-10-CM

## 2021-05-09 ENCOUNTER — Other Ambulatory Visit: Payer: Self-pay | Admitting: Internal Medicine

## 2021-05-09 DIAGNOSIS — E559 Vitamin D deficiency, unspecified: Secondary | ICD-10-CM

## 2021-05-20 ENCOUNTER — Telehealth: Payer: Self-pay

## 2021-05-20 DIAGNOSIS — M19071 Primary osteoarthritis, right ankle and foot: Secondary | ICD-10-CM | POA: Diagnosis not present

## 2021-05-20 DIAGNOSIS — M19072 Primary osteoarthritis, left ankle and foot: Secondary | ICD-10-CM | POA: Diagnosis not present

## 2021-05-20 NOTE — Progress Notes (Signed)
    Chronic Care Management Pharmacy Assistant   Name: Valerie [name]  MRN: [id number] DOB: 09/15/1946   Medications: Outpatient Encounter Medications as of 05/20/2021  Medication Sig  . albuterol (PROVENTIL) (2.5 MG/3ML) 0.083% nebulizer solution   . albuterol (VENTOLIN HFA) 108 (90 Base) MCG/ACT inhaler Inhale 2 puffs into the lungs every 6 (six) hours as needed. For shortness of breath  . aspirin 81 MG tablet Take 81 mg by mouth daily.   [name] atorvastatin (LIPITOR) 20 MG tablet TAKE 1 TABLET BY MOUTH EVERY DAY  . budesonide-formoterol (SYMBICORT) 80-4.5 MCG/ACT inhaler Inhale 2 puffs into the lungs as needed.   . carvedilol (COREG) 25 MG tablet TAKE 1 TABLET (25 MG TOTAL) BY MOUTH 2 (TWO) TIMES DAILY WITH A MEAL.  . chlorthalidone (HYGROTON) 25 MG tablet TAKE 1 TABLET (25 MG TOTAL) BY MOUTH DAILY.  . clobetasol ointment (TEMOVATE) 1.44 % Apply 1 application topically 2 (two) times daily.  . D3 50 MCG (2000 UT) TABS TAKE 1 TABLET BY MOUTH EVERY DAY  . Dulaglutide (TRULICITY) 1.5 YJ/8.5UD SOPN Inject 0.5 mLs (1.5 mg total) into the skin once a week.  . Empagliflozin-metFORMIN HCl (SYNJARDY) 12.5-500 MG TABS Take 1 tablet by mouth 2 (two) times daily.  . famotidine (PEPCID) 40 MG tablet Take 1 tablet (40 mg total) by mouth daily.  . ferrous sulfate 325 (65 FE) MG tablet TAKE 1 TABLET BY MOUTH TWICE DAILY WITH A MEAL  . gabapentin (NEURONTIN) 300 MG capsule Take 1 capsule (300 mg total) by mouth 3 (three) times daily.  [name] glucose blood (FREESTYLE TEST STRIPS) test strip USE TO TEST BLOOD SUGAR 3 TIMES A DAY. DX: E11.8  . Insulin Pen Needle 31G X 5 MM MISC Use once daily with insulin  . irbesartan (AVAPRO) 300 MG tablet TAKE 0.5 TABLETS (150 MG TOTAL) BY MOUTH DAILY.  [name] latanoprost (XALATAN) 0.005 % ophthalmic solution Place 1 drop into both eyes at bedtime.   [name] nystatin ointment (MYCOSTATIN) APPLY ON THE SKIN THREE TIMES DAILY  . oxyCODONE-acetaminophen (PERCOCET/ROXICET) 5-325 MG tablet Take 1  tablet by mouth every 8 (eight) hours as needed for severe pain.  . pantoprazole (PROTONIX) 40 MG tablet Take 1 tablet (40 mg total) by mouth 2 (two) times daily before a meal.  . potassium chloride SA (KLOR-CON M20) 20 MEQ tablet TAKE 1 TABLET BY MOUTH TWICE A DAY   No facility-administered encounter medications on file as of 05/20/2021.    Pharmacist Review  [name] today stating that she called [employer] for refills for [facility] and was told that she does not have an active application on file. I then called [employer] and spoke with representative [name] who also stated that the patient did not have application on file. Clinical pharmacist [name] pulled up the approval letter and then we discovered that the DOB was wrong. So a new account was made for the patient and she will receive a shipment in the next five business days.  [name] Pharmacist Assistant [phone]  Time spent:40

## 2021-05-28 DIAGNOSIS — M79671 Pain in right foot: Secondary | ICD-10-CM | POA: Diagnosis not present

## 2021-05-28 DIAGNOSIS — E1351 Other specified diabetes mellitus with diabetic peripheral angiopathy without gangrene: Secondary | ICD-10-CM | POA: Diagnosis not present

## 2021-05-28 DIAGNOSIS — M722 Plantar fascial fibromatosis: Secondary | ICD-10-CM | POA: Diagnosis not present

## 2021-05-28 DIAGNOSIS — M79672 Pain in left foot: Secondary | ICD-10-CM | POA: Diagnosis not present

## 2021-05-29 ENCOUNTER — Ambulatory Visit (INDEPENDENT_AMBULATORY_CARE_PROVIDER_SITE_OTHER): Payer: HMO | Admitting: Pharmacist

## 2021-05-29 ENCOUNTER — Other Ambulatory Visit: Payer: Self-pay

## 2021-05-29 DIAGNOSIS — I10 Essential (primary) hypertension: Secondary | ICD-10-CM

## 2021-05-29 DIAGNOSIS — J4520 Mild intermittent asthma, uncomplicated: Secondary | ICD-10-CM

## 2021-05-29 DIAGNOSIS — E785 Hyperlipidemia, unspecified: Secondary | ICD-10-CM | POA: Diagnosis not present

## 2021-05-29 DIAGNOSIS — E118 Type 2 diabetes mellitus with unspecified complications: Secondary | ICD-10-CM | POA: Diagnosis not present

## 2021-05-29 NOTE — Progress Notes (Signed)
Chronic Care Management Pharmacy Note  05/30/2021 Name:  Valerie [name] MRN:  [id number] DOB:  [birth year]  Summary: -Pt is having dizzy spells, BP is normal during episodes so does not appear related to hypotension -Pt is eating 1 meal per day  Recommendations/Changes made from today's visit: -Advised to drink Glucerna shakes in between meals -Advised to maintain adequate hydration -Advised to make PCP appt if dizziness does not improve   Subjective: Valerie [name] is an 75 y.o. year old female who is a primary patient of Janith Lima, MD.  The CCM team was consulted for assistance with disease management and care coordination needs.    Engaged with patient by telephone for follow up visit in response to provider referral for pharmacy case management and/or care coordination services.   Consent to Services:  The patient was given information about Chronic Care Management services, agreed to services, and gave verbal consent prior to initiation of services.  Please see initial visit note for detailed documentation.   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 Her husband passed in 2021 and it has been very difficult for her and her disabled son she cares for.  Recent office visits: 04/10/21 Dr Ronnald Ramp OV: chronic f/u; labs stable; H/H chronically low  Recent consult visits: 04/30/21 PA Eulas Post (cardiology): f/u palpitations; hx atypical chest pain, previous workup negative. Dc'd Myrbetriq, Gemtesa (pt not taking).  Hospital visits: None in previous 6 months   Objective:  Lab Results  Component Value  Date   CREATININE 0.93 02/10/2021   BUN 11 02/10/2021   GFR 61.22 02/10/2021   GFRNONAA >60 08/29/2015   GFRAA >60 08/29/2015   NA 139 02/10/2021   K 3.6 02/10/2021   CALCIUM 9.9 02/10/2021   CO2 30 02/10/2021   GLUCOSE 106 (H) 02/10/2021    Lab Results  Component Value Date/Time   HGBA1C 6.9 (H) 04/10/2021 02:13 PM   HGBA1C 7.0 (H) 02/10/2021 12:42 PM   GFR 61.22 02/10/2021 12:42 PM   GFR 73.48 05/27/2020 01:59 PM   MICROALBUR 0.5 07/01/2020 11:43 AM   MICROALBUR 0.8 05/04/2019 12:12 PM    Last diabetic Eye exam:  Lab Results  Component Value Date/Time   HMDIABEYEEXA No Retinopathy 03/11/2021 12:00 AM    Last diabetic Foot exam:  Lab Results  Component Value Date/Time   HMDIABFOOTEX done 06/20/2018 12:00 AM     Lab Results  Component Value Date   CHOL 115 04/10/2021   HDL 40.40 04/10/2021   LDLCALC 43 03/28/2014   LDLDIRECT 42.0 04/10/2021   TRIG 232.0 (H) 04/10/2021   CHOLHDL 3 04/10/2021    Hepatic Function Latest Ref Rng & Units 04/10/2021 02/21/2020 09/13/2019  Total Protein 6.0 - 8.3 g/dL 7.7 7.7 7.9  Albumin 3.5 - 5.2 g/dL 4.2 4.2 4.5  AST 0 - 37 U/L '17 18 16  ' ALT 0 - 35 U/L '20 18 18  ' Alk Phosphatase 39 - 117 U/L 109 95 90  Total Bilirubin 0.2 - 1.2 mg/dL 0.4 0.6 0.5  Bilirubin, Direct 0.0 - 0.3 mg/dL 0.1 0.0 0.1    Lab Results  Component Value Date/Time   TSH  1.64 02/06/2021 01:53 PM   TSH 3.79 07/01/2020 11:43 AM    CBC Latest Ref Rng & Units 04/10/2021 02/10/2021 07/01/2020  WBC 4.0 - 10.5 K/uL 9.2 10.2 10.4  Hemoglobin 12.0 - 15.0 g/dL 10.8(L) 11.0(L) 11.8  Hematocrit 36.0 - 46.0 % 32.6(L) 33.0(L) 35.3  Platelets 150.0 - 400.0 K/uL 368.0 332.0 359   Iron/TIBC/Ferritin/ %Sat    Component Value Date/Time   IRON 73 04/10/2021 1413   TIBC 356 07/01/2020 1146   FERRITIN 68.2 04/10/2021 1413   IRONPCTSAT 23 07/01/2020 1146     Lab Results  Component Value Date/Time   VD25OH 36 07/01/2020 11:43 AM   VD25OH 66.29 02/21/2020 02:22 PM   VD25OH  82.13 09/13/2019 04:58 PM    Clinical ASCVD: No  The ASCVD Risk score (Goff DC Jr., et al., 2013) failed to calculate for the following reasons:   The patient has a prior MI or stroke diagnosis    Depression screen [facility] 2/9 02/27/2021 05/27/2020 03/18/2020  Decreased Interest '1 1 1  ' Down, Depressed, Hopeless '3 1 2  ' PHQ - 2 Score '4 2 3  ' Altered sleeping '3 2 2  ' Tired, decreased energy '3 2 2  ' Change in appetite '3 2 2  ' Feeling bad or failure about yourself  0 0 3  Trouble concentrating '3 3 3  ' Moving slowly or fidgety/restless 0 1 0  Suicidal thoughts 0 0 0  PHQ-9 Score '16 12 15  ' Difficult doing work/chores Somewhat difficult Very difficult Very difficult  Some recent data might be hidden      Social History   Tobacco Use  Smoking Status Never  Smokeless Tobacco Never   BP Readings from Last 3 Encounters:  04/30/21 118/72  04/10/21 138/70  02/27/21 (!) 170/80   Pulse Readings from Last 3 Encounters:  04/30/21 81  04/10/21 82  02/27/21 86   Wt Readings from Last 3 Encounters:  04/30/21 237 lb 6.4 oz (107.7 kg)  04/10/21 240 lb (108.9 kg)  02/27/21 245 lb 6.4 oz (111.3 kg)   BMI Readings from Last 3 Encounters:  04/30/21 38.32 kg/m  04/10/21 38.74 kg/m  02/27/21 39.61 kg/m    Assessment/Interventions: Review of patient past medical history, allergies, medications, health status, including review of consultants reports, laboratory and other test data, was performed as part of comprehensive evaluation and provision of chronic care management services.   SDOH:  (Social Determinants of Health) assessments and interventions performed: Yes  SDOH Screenings   Alcohol Screen: Low Risk    Last Alcohol Screening Score (AUDIT): 0  Depression (PHQ2-9): Medium Risk   PHQ-2 Score: 16  Financial Resource Strain: Low Risk    Difficulty of Paying Living Expenses: Not hard at all  Food Insecurity: No Food Insecurity   Worried About Charity fundraiser in the Last Year: Never true    Ran Out of Food in the Last Year: Never true  Housing: Low Risk    Last Housing Risk Score: 0  Physical Activity: Inactive   Days of Exercise per Week: 0 days   Minutes of Exercise per Session: 0 min  Social Connections: Moderately Integrated   Frequency of Communication with Friends and Family: More than three times a week   Frequency of Social Gatherings with Friends and Family: Once a week   Attends Religious Services: 1 to 4 times per year   Active Member of [employer] or Organizations: No   Attends Archivist Meetings: 1 to 4 times per year  Marital Status: Widowed  Stress: Stress Concern Present   Feeling of Stress : Rather much  Tobacco Use: Low Risk    Smoking Tobacco Use: Never   Smokeless Tobacco Use: Never  Transportation Needs: Not on file    CCM Care Plan  Allergies  Allergen Reactions   Food Swelling    bananas   Penicillins Swelling and Rash    Medications Reviewed Today     Reviewed by Charlton Haws, [facility] (Pharmacist) on 05/30/21 at 1627  Med List Status: <None>   Medication Order Taking? Sig Documenting Provider Last Dose Status Informant  albuterol (PROVENTIL) (2.5 MG/3ML) 0.083% nebulizer solution [id number] Yes  [provider] Taking Active            Med Note Kellie Simmering, [place]   Wed Apr 30, 2021  1:44 PM)    albuterol (VENTOLIN HFA) 108 (90 Base) MCG/ACT inhaler [id number] Yes Inhale 2 puffs into the lungs every 6 (six) hours as needed. For shortness of breath Janith Lima, MD Taking Active            Med Note ([place]   Tue Jul 02, 2020 10:27 AM)    aspirin 81 MG tablet [id number] Yes Take 81 mg by mouth daily.  [provider] Taking Active Self  atorvastatin (LIPITOR) 20 MG tablet [id number] Yes TAKE 1 TABLET BY MOUTH EVERY DAY Janith Lima, MD Taking Active   budesonide-formoterol [facility]) 80-4.5 MCG/ACT inhaler [id number] Yes Inhale 2 puffs into the lungs as needed.  [provider] Taking Active             Med Note Alvin Critchley   Wed Apr 30, 2021  1:44 PM)    carvedilol (COREG) 25 MG tablet [id number] Yes TAKE 1 TABLET (25 MG TOTAL) BY MOUTH 2 (TWO) TIMES DAILY WITH A MEAL. Janith Lima, MD Taking Active   chlorthalidone (HYGROTON) 25 MG tablet [id number] Yes TAKE 1 TABLET (25 MG TOTAL) BY MOUTH DAILY. Janith Lima, MD Taking Active   clobetasol ointment (TEMOVATE) 0.05 % [id number] Yes Apply 1 application topically 2 (two) times daily. Janith Lima, MD Taking Active   D3 50 MCG [phone]) TABS [id number] Yes TAKE 1 TABLET BY MOUTH EVERY DAY Janith Lima, MD Taking Active   Dulaglutide (TRULICITY) 1.5 VC/9.4WH SOPN [id number] Yes Inject 0.5 mLs (1.5 mg total) into the skin once a week. Janith Lima, MD Taking Active            Med Note Alvin Critchley   Wed Apr 30, 2021  1:44 PM)    Empagliflozin-metFORMIN HCl [facility]) 12.5-500 MG TABS [id number] Yes Take 1 tablet by mouth 2 (two) times daily. Janith Lima, MD Taking Active            Med Note Alvin Critchley   Wed Apr 30, 2021  1:44 PM)    famotidine (PEPCID) 40 MG tablet [id number] Yes Take 1 tablet (40 mg total) by mouth daily. Janith Lima, MD Taking Active   ferrous sulfate 325 (65 FE) MG tablet [id number] Yes TAKE 1 TABLET BY MOUTH TWICE DAILY WITH A MEAL Janith Lima, MD Taking Active   gabapentin (NEURONTIN) 300 MG capsule [id number] Yes Take 1 capsule (300 mg total) by mouth 3 (three) times daily. Janith Lima, MD Taking Active   glucose blood (FREESTYLE TEST STRIPS) test strip [id number] Yes USE TO TEST BLOOD SUGAR 3 TIMES A DAY. DX: E11.8 Janith Lima, MD  Taking Active   Insulin Pen Needle 31G X 5 MM MISC [id number] Yes Use once daily with insulin Janith Lima, MD Taking Active   irbesartan (AVAPRO) 300 MG tablet [id number] Yes TAKE 0.5 TABLETS (150 MG TOTAL) BY MOUTH DAILY. Janith Lima, MD Taking Active   latanoprost (XALATAN) 0.005 % ophthalmic solution [id number] Yes Place 1 drop into both eyes  at bedtime.  [provider] Taking Active Self  nystatin ointment (MYCOSTATIN) [id number] Yes APPLY ON THE SKIN THREE TIMES DAILY [provider] Taking Active   oxyCODONE-acetaminophen (PERCOCET/ROXICET) 5-325 MG tablet [id number] Yes Take 1 tablet by mouth every 8 (eight) hours as needed for severe pain. Janith Lima, MD Taking Active   pantoprazole (PROTONIX) 40 MG tablet [id number] Yes Take 1 tablet (40 mg total) by mouth 2 (two) times daily before a meal. Janith Lima, MD Taking Active   potassium chloride SA (KLOR-CON M20) 20 MEQ tablet [id number] Yes TAKE 1 TABLET BY MOUTH TWICE A Frances Nickels, MD Taking Active             Patient Active Problem List   Diagnosis Date Noted   Tinea corporis 10/01/2020   Degenerative arthritis of right knee 07/02/2020   Moderate episode of recurrent major depressive disorder ([place]) 07/01/2020   Esophageal dysphagia 05/27/2020   Gastroesophageal reflux disease with esophagitis without hemorrhage 05/27/2020   Diuretic-induced hypokalemia 03/27/2020   OAB (overactive bladder) 03/27/2020   Insomnia secondary to chronic pain 02/21/2020   Primary osteoarthritis involving multiple joints 10/10/2019   Thiamine deficiency, unspecified 01/09/2019   Eczema 08/18/2018   Palpitations 08/18/2018   Vitamin D deficiency disease 08/11/2018   Long-term current use of opiate analgesic 06/21/2018   External bleeding hemorrhoids 03/24/2018   Obstructive sleep apnea treated with BiPAP 11/03/2017   Osteopenia 03/28/2014   Insomnia 03/28/2014   Routine general medical examination at a health care facility 07/17/2013   Morbid obesity with BMI of 45.0-49.9, adult ([place]) 04/14/2013   Visit for screening mammogram 01/20/2013   Spinal stenosis of lumbar 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 10/05/2012   Hypothyroidism 02/20/2011   Fatty liver disease, nonalcoholic 16/57/9038   Mild intermittent asthma 10/25/2009   Type II diabetes mellitus with  manifestations ([place]) 05/06/2009   Hyperlipidemia with target LDL less than 100 05/06/2009   B12 deficiency anemia 05/06/2009   Depression with anxiety 05/06/2009   Essential hypertension, benign 05/06/2009   GERD 05/06/2009    Immunization History  Administered Date(s) Administered   Fluad Quad(high Dose 65+) 09/13/2019   Influenza Split 09/21/2011, 08/19/2012   Influenza Whole 09/25/2009, 09/04/2010   Influenza, High Dose Seasonal PF 09/23/2016, 10/06/2017, 08/18/2018   Influenza,inj,Quad PF,6+ Mos 09/01/2013, 11/29/2014, 08/30/2015, 08/13/2020   PFIZER(Purple Top)SARS-COV-2 Vaccination 02/11/2020, 03/06/2020, 09/19/2020   Pneumococcal Conjugate-13 07/13/2014   Pneumococcal Polysaccharide-23 01/20/2013, 04/18/2018   Td 02/05/2010   Tdap 06/02/2017   Zoster Recombinat (Shingrix) 08/18/2018, 01/04/2019    Conditions to be addressed/monitored:  Hypertension, Hyperlipidemia, Diabetes, Asthma, and Pain  Care Plan : [place]  Updates made by Charlton Haws, [place] since 05/30/2021 12:00 AM     Problem: Hypertension, Hyperlipidemia, Diabetes, Asthma, and Pain   Priority: High     Long-Range Goal: Disease management   Start Date: 05/30/2021  Expected End Date: 05/30/2022  This Visit's Progress: On track  Priority: High  Note:   Current Barriers:  Unable to independently monitor therapeutic efficacy  Pharmacist Clinical Goal(s):  Patient will achieve adherence to monitoring  guidelines and medication adherence to achieve therapeutic efficacy through collaboration with PharmD and provider.   Interventions: 1:1 collaboration with Janith Lima, MD regarding development and update of comprehensive plan of care as evidenced by provider attestation and co-signature Inter-disciplinary care team collaboration (see longitudinal plan of care) Comprehensive medication review performed; medication list updated in electronic medical record  Hypertension  BP goal is:   <130/80 Patient checks BP at home daily Patient home BP readings are ranging: 125/70   Patient has failed these meds in the past: n/a Patient is currently controlled on the following medications: Irbesartan 300 mg - 1/2 tablet daily (pt takes when BP > 160/100, occurs once or twice a month) Chlorthalidone 25 mg daily Carvedilol 25 mg BID    We discussed: Pt reports she had a dizzy spell yesterday and today. She says this occurred when getting out of bed. She endorses drinking 5 water bottles per day. When prompted, she reports she skips breakfast a lot - usually eats 1 meal a day.    Plan:  -Advised to drink Glucerna shakes if she is going to skip meals  -Advised to maintain adequate hydration.  -If dizziness continues/does not improve, advised to make appt with PCP to evaluate further   Hyperlipidemia    LDL goal < 100 Significant family history of MI, no personal hx of MI or stroke  Patient has failed these meds in past: n/a Patient is currently controlled on the following medications: Atorvastatin 20 mg daily HS Aspirin 81 mg daily   We discussed:  LDL is at goal; pt endorses compliance and denies issues   Plan: Continue current medications   Diabetes    A1c goal <7% Checking BG: Daily Recent FBG Readings: 130-140   Patient has failed these meds in past: Invokana, Farxiga, Janumet, pioglitazone, nateglinide Patient is currently controlled on the following medications: Trulicity 1.5 mg weekly Friday (via PAP) Synjardy 12.5-500 mg BID (via PAP)   We discussed: A1c is at goal; pt endorses compliance and denies issues   Plan: Continue current medications and control with diet and exercise    Asthma    Last spirometry score: n/a  Patient has failed these meds in past: [place] Patient is currently controlled on the following medications: Symbicort HFA 80-4.5 mcg/act 2 puffs PRN (via PAP) Albuterol HFA prn Albuterol nebulized PRN   Using maintenance  inhaler regularly? Not every day but several times a week Frequency of rescue inhaler use:  1-2x per week   We discussed:  proper inhaler technique; benefits of ICS/LABA as needed based on asthma guidelines; pt denies issues   Plan: Continue current medications   Pain    Degenerative arthritis of R Knee Spinal stenosis of lumbar region Plantar fasciitis   Patient has failed these meds in past: n/a Patient is currently controlled on the following medications: Gabapentin 300 mg TID Oxycodone-APAP 5-325 mg q8h PRN Pennsaid solution PRN   We discussed:  Pt reports pain is relatively well controlled with this regimen   Plan: Continue current medications   Patient Goals/Self-Care Activities Patient will:  - take medications as prescribed focus on medication adherence by pill box check glucose daily, document, and provide at future appointments check blood pressure daily, document, and provide at future appointments engage in dietary modifications by using Glucerna as meal replacement      Medication Assistance:  Synjardy ([employer]) - approved through 60/45/40 Trulicity [employer] - approved through 12/13/21 Symbicort (AZ&Me) - approved through  12/13/21   Compliance/Adherence/Medication fill history: Care Gaps: Covid booster - at [facility]: Atorvastatin - LF 05/03/21 x 90 ds Irbesartan - LF 03/20/21 x 90 ds  Patient's preferred pharmacy is:  [id number] [place] [address]Phone: [phone]: [phone] [place] [address] [place] [address]Phone: [phone]: [phone] Uses pill box? Yes Pt endorses 100% compliance  We discussed: Current pharmacy is preferred with insurance plan and patient is satisfied with pharmacy services Patient decided to: Continue current medication management  strategy  Care Plan and Follow Up Patient Decision:  Patient agrees to Care Plan and Follow-up.  Plan: Telephone follow up appointment with care management team member scheduled for:  3 months  LCharlene Brooke PharmD, BClem[place] CPP Clinical Pharmacist [phone]

## 2021-05-30 NOTE — Patient Instructions (Addendum)
Visit Information  Phone number for Pharmacist: [phone]   Goals Addressed             This Visit's Progress    Manage My Medicine       Timeframe:  Long-Range Goal Priority:  Medium Start Date:   05/30/21                          Expected End Date:    05/30/22                   Follow Up Date: Dec 2022   - call for medicine refill 2 or 3 days before it runs out - call if I am sick and can't take my medicine - keep a list of all the medicines I take; vitamins and herbals too - use a pillbox to sort medicine    Why is this important?   These steps will help you keep on track with your medicines.   Notes:          The patient verbalized understanding of instructions, educational materials, and care plan provided today and declined offer to receive copy of patient instructions, educational materials, and care plan.  Telephone follow up appointment with pharmacy team member scheduled for: 3 months  Charlene Brooke, PharmD, [place], CPP Clinical Pharmacist [facility] [phone]

## 2021-06-02 ENCOUNTER — Telehealth: Payer: Self-pay | Admitting: Pharmacist

## 2021-06-02 NOTE — Progress Notes (Signed)
error 

## 2021-06-28 ENCOUNTER — Other Ambulatory Visit: Payer: Self-pay | Admitting: Internal Medicine

## 2021-06-28 DIAGNOSIS — D509 Iron deficiency anemia, unspecified: Secondary | ICD-10-CM

## 2021-07-09 ENCOUNTER — Other Ambulatory Visit: Payer: Self-pay | Admitting: Internal Medicine

## 2021-07-09 ENCOUNTER — Telehealth: Payer: Self-pay | Admitting: Internal Medicine

## 2021-07-09 DIAGNOSIS — D509 Iron deficiency anemia, unspecified: Secondary | ICD-10-CM

## 2021-07-09 DIAGNOSIS — K2100 Gastro-esophageal reflux disease with esophagitis, without bleeding: Secondary | ICD-10-CM

## 2021-07-09 DIAGNOSIS — I10 Essential (primary) hypertension: Secondary | ICD-10-CM

## 2021-07-09 DIAGNOSIS — U071 COVID-19: Secondary | ICD-10-CM | POA: Diagnosis not present

## 2021-07-09 DIAGNOSIS — M48061 Spinal stenosis, lumbar region without neurogenic claudication: Secondary | ICD-10-CM

## 2021-07-09 DIAGNOSIS — E118 Type 2 diabetes mellitus with unspecified complications: Secondary | ICD-10-CM

## 2021-07-09 NOTE — Telephone Encounter (Signed)
    Patient calling to report she is covid+ Cold symptoms She has virtual appointment scheduled for 7/28 with Dr Maudie Mercury   Patient seeking advice today  Please call

## 2021-07-10 ENCOUNTER — Telehealth: Payer: HMO | Admitting: Family Medicine

## 2021-07-10 ENCOUNTER — Ambulatory Visit: Payer: HMO | Admitting: Internal Medicine

## 2021-07-24 NOTE — Progress Notes (Signed)
Canceled or no show for virtual visit.

## 2021-08-05 DIAGNOSIS — G479 Sleep disorder, unspecified: Secondary | ICD-10-CM | POA: Diagnosis not present

## 2021-08-05 DIAGNOSIS — G4733 Obstructive sleep apnea (adult) (pediatric): Secondary | ICD-10-CM | POA: Diagnosis not present

## 2021-08-06 ENCOUNTER — Other Ambulatory Visit: Payer: Self-pay

## 2021-08-06 ENCOUNTER — Encounter: Payer: Self-pay | Admitting: Internal Medicine

## 2021-08-06 ENCOUNTER — Ambulatory Visit (INDEPENDENT_AMBULATORY_CARE_PROVIDER_SITE_OTHER): Payer: HMO | Admitting: Internal Medicine

## 2021-08-06 VITALS — BP 138/74 | HR 80 | Temp 98.3°F | Resp 16 | Ht 66.0 in | Wt 235.0 lb

## 2021-08-06 DIAGNOSIS — R270 Ataxia, unspecified: Secondary | ICD-10-CM | POA: Diagnosis not present

## 2021-08-06 DIAGNOSIS — I679 Cerebrovascular disease, unspecified: Secondary | ICD-10-CM | POA: Insufficient documentation

## 2021-08-06 DIAGNOSIS — M8949 Other hypertrophic osteoarthropathy, multiple sites: Secondary | ICD-10-CM

## 2021-08-06 DIAGNOSIS — M48061 Spinal stenosis, lumbar region without neurogenic claudication: Secondary | ICD-10-CM | POA: Diagnosis not present

## 2021-08-06 DIAGNOSIS — E118 Type 2 diabetes mellitus with unspecified complications: Secondary | ICD-10-CM

## 2021-08-06 DIAGNOSIS — E785 Hyperlipidemia, unspecified: Secondary | ICD-10-CM | POA: Diagnosis not present

## 2021-08-06 DIAGNOSIS — I10 Essential (primary) hypertension: Secondary | ICD-10-CM | POA: Diagnosis not present

## 2021-08-06 DIAGNOSIS — M159 Polyosteoarthritis, unspecified: Secondary | ICD-10-CM

## 2021-08-06 LAB — CBC WITH DIFFERENTIAL/PLATELET
Basophils Absolute: 0.1 10*3/uL (ref 0.0–0.1)
Basophils Relative: 0.5 % (ref 0.0–3.0)
Eosinophils Absolute: 0.3 10*3/uL (ref 0.0–0.7)
Eosinophils Relative: 3.1 % (ref 0.0–5.0)
HCT: 33.7 % — ABNORMAL LOW (ref 36.0–46.0)
Hemoglobin: 11.2 g/dL — ABNORMAL LOW (ref 12.0–15.0)
Lymphocytes Relative: 28.6 % (ref 12.0–46.0)
Lymphs Abs: 3 10*3/uL (ref 0.7–4.0)
MCHC: 33.3 g/dL (ref 30.0–36.0)
MCV: 88.7 fl (ref 78.0–100.0)
Monocytes Absolute: 1.3 10*3/uL — ABNORMAL HIGH (ref 0.1–1.0)
Monocytes Relative: 11.8 % (ref 3.0–12.0)
Neutro Abs: 6 10*3/uL (ref 1.4–7.7)
Neutrophils Relative %: 56 % (ref 43.0–77.0)
Platelets: 388 10*3/uL (ref 150.0–400.0)
RBC: 3.8 Mil/uL — ABNORMAL LOW (ref 3.87–5.11)
RDW: 17.1 % — ABNORMAL HIGH (ref 11.5–15.5)
WBC: 10.6 10*3/uL — ABNORMAL HIGH (ref 4.0–10.5)

## 2021-08-06 LAB — BASIC METABOLIC PANEL
BUN: 13 mg/dL (ref 6–23)
CO2: 30 mEq/L (ref 19–32)
Calcium: 10 mg/dL (ref 8.4–10.5)
Chloride: 95 mEq/L — ABNORMAL LOW (ref 96–112)
Creatinine, Ser: 1.13 mg/dL (ref 0.40–1.20)
GFR: 48.29 mL/min — ABNORMAL LOW (ref >60.00)
Glucose, Bld: 87 mg/dL (ref 70–99)
Potassium: 3.5 mEq/L (ref 3.5–5.1)
Sodium: 136 mEq/L (ref 135–145)

## 2021-08-06 LAB — HEMOGLOBIN A1C: Hgb A1c MFr Bld: 6.7 % — ABNORMAL HIGH (ref 4.6–6.5)

## 2021-08-06 MED ORDER — OXYCODONE-ACETAMINOPHEN 5-325 MG PO TABS: 1.0000 | ORAL_TABLET | Freq: Three times a day (TID) | ORAL | 0 refills | Status: DC | PRN

## 2021-08-06 MED ORDER — ATORVASTATIN CALCIUM 20 MG PO TABS: 20.0000 mg | ORAL_TABLET | Freq: Every day | ORAL | 1 refills | Status: DC

## 2021-08-06 NOTE — Patient Instructions (Signed)
Ataxia Ataxia is a condition that causes unsteadiness when walking and standing, poor coordination of body movements, and difficulty keeping a straight (upright) posture. A problem with the part of the brain that controls coordination and stability (cerebellum) can cause this condition. Ataxia can develop later in life (acquired ataxia), during your 20s or 30s, or into your 60s or later. This type of ataxia develops when another medical condition, such as a stroke, damages the cerebellum. Ataxia also may be present early in life (non-acquired ataxia). There are two main types of non-acquired ataxia: Congenital. This type is present at birth. Hereditary. This type is passed from parent to child. The most common form of hereditary non-acquired ataxia is Friedreich ataxia. What are the causes? Acquired ataxia may be caused by: Changes in the nervous system (neurodegenerative changes). Changes throughout the body (systemic disorders). A lot of exposure to: Certain medicines, such as phenytoin and lithium. Solvents. These are cleaning fluids, such as paint thinner, nail polish remover, carpet cleaner, and degreasers. Alcohol abuse. Medical conditions, such as: Celiac disease. Hypothyroidism. A lack (deficiency) of vitamin E, vitamin B12, or thiamine. Brain tumors. Multiple sclerosis. Cerebral palsy. Stroke. Paraneoplastic syndromes. These are rare disorders triggered by the body's defense system (immune system) in response to a cancerous tumor. Viral infections. Head injury. Malnutrition. Congenital and hereditary ataxia are caused by problems that are present in genes before birth. What are the signs or symptoms? Signs and symptoms of ataxia may vary, depending on the cause. They may include: Being unsteady. Walking with the legs wide apart to keep one's balance. Uncontrolled shaking (tremor). Poorly coordinated body movements. Difficulty maintaining an upright posture. Fatigue. Changes  in speech, vision, or both. Involuntary eye movements. Difficulty swallowing. Difficulty writing. Muscle tightening that you cannot control (muscle spasms). How is this diagnosed? Ataxia may be diagnosed based on: Your personal and family medical history. A physical exam. Imaging tests, such as a CT scan or MRI. Spinal tap (lumbar puncture). This procedure involves using a needle to take a sample of the fluid around your brain and spinal cord. Genetic testing. Blood work. How is this treated? The underlying condition that causes ataxia needs to be treated. If the cause is a brain tumor, you may need surgery. Treatment also focuses on improving your quality of life. This may involve: Physical therapy. This helps you to learn ways to improve coordination and move around more carefully. Occupational therapy. This helps you to improve your ability to do daily tasks, such as bathing and feeding yourself. Using devices to help you move around, eat, or communicate. These are called assistive devices, and they include a walker, modified eating utensils, and communication aids. Speech therapy. This helps you to learn ways to improve speech and swallowing. Follow these instructions at home: Preventing falls Lie down right away if you become very unsteady, dizzy, or nauseous, or if you feel like you are going to faint. Do not get up until all of those feelings pass. Keep your home well-lit. Use night-lights as needed. Remove tripping hazards, such as throw rugs, cords, and clutter. Install grab bars by the toilet and in the tub and shower. Use assistive devices, such as a cane, walker, or wheelchair as needed to keep your balance. General instructions Do not drink alcohol. Ask your health care provider what activities are safe for you, and what activities you should avoid. Take over-the-counter and prescription medicines only as told by your health care provider. Contact a health care provider  if:   You have increasing unsteadiness or fall. You need equipment to help with walking, such as a cane or walker. You have difficulty with swallowing. Get help right away if: You have unsteadiness that suddenly worsens. You have any of these: Severe headaches. Chest pain. Abdominal pain. Weakness or numbness on one side of your body. Vision problems. Difficulty speaking. An irregular heartbeat. A very fast pulse. You feel confused. These symptoms may represent a serious problem that is an emergency. Do not wait to see if the symptoms will go away. Get medical help right away. Call your local emergency services (911 in the [place] Do not drive yourself to the hospital. Summary Ataxia is a condition that causes unsteadiness when walking and standing, poor coordination of body movements, and difficulty keeping a straight (upright) posture. Ataxia occurs because of a problem with the part of the brain that controls coordination and stability (cerebellum). The underlying condition that causes your ataxia needs to be treated. Treatment also focuses on improving your quality of life. This information is not intended to replace advice given to you by your health care provider. Make sure you discuss any questions you have with your health care provider. Document Revised: 11/24/2020 Document Reviewed: 11/24/2020 Elsevier Patient Education  2022 [employer]  

## 2021-08-06 NOTE — Progress Notes (Signed)
Subjective:  Patient ID: Valerie [name], female    DOB: [birth year]  Age: 75 y.o. MRN: [id number]  CC: Follow-up (Patient is having some dizziness that started Monday)  This visit occurred during the SARS-CoV-2 public health emergency.  Safety protocols were in place, including screening questions prior to the visit, additional usage of staff PPE, and extensive cleaning of exam room while observing appropriate contact time as indicated for disinfecting solutions.    HPI Valerie [name] presents for f/up -  She complains of a 3-day history of acute onset ataxia, dizziness, and lightheadedness.  She denies nausea, vomiting, numbness, tingling, or paresthesias.  Outpatient Medications Prior to Visit  Medication Sig Dispense Refill   albuterol (PROVENTIL) (2.5 MG/3ML) 0.083% nebulizer solution      albuterol (VENTOLIN HFA) 108 (90 Base) MCG/ACT inhaler Inhale 2 puffs into the lungs every 6 (six) hours as needed. For shortness of breath 3 Inhaler 4   aspirin 81 MG tablet Take 81 mg by mouth daily.      budesonide-formoterol (SYMBICORT) 80-4.5 MCG/ACT inhaler Inhale 2 puffs into the lungs as needed.      carvedilol (COREG) 25 MG tablet TAKE 1 TABLET (25 MG TOTAL) BY MOUTH 2 (TWO) TIMES DAILY WITH A MEAL. 180 tablet 0   chlorthalidone (HYGROTON) 25 MG tablet TAKE 1 TABLET (25 MG TOTAL) BY MOUTH DAILY. 90 tablet 0   clobetasol ointment (TEMOVATE) [id number] % Apply 1 application topically 2 (two) times daily. 60 g 1   D3 50 MCG (2000 UT) TABS TAKE 1 TABLET BY MOUTH EVERY DAY 90 tablet 1   Dulaglutide (TRULICITY) 1.5 0000000 SOPN Inject 0.5 mLs (1.5 mg total) into the skin once a week. 4 pen 0   Empagliflozin-metFORMIN HCl (SYNJARDY) 12.5-500 MG TABS Take 1 tablet by mouth 2 (two) times daily. 180 tablet 3   famotidine (PEPCID) 40 MG tablet TAKE 1 TABLET BY MOUTH EVERY DAY 90 tablet 1   ferrous sulfate 325 (65 FE) MG tablet TAKE 1 TABLET BY MOUTH TWICE DAILY WITH A MEAL 180 tablet 0   gabapentin  (NEURONTIN) 300 MG capsule TAKE 1 CAPSULE BY MOUTH THREE TIMES A DAY 270 capsule 0   glucose blood (FREESTYLE TEST STRIPS) test strip USE TO TEST BLOOD SUGAR 3 TIMES A DAY. DX: E11.8 300 strip 1   Insulin Pen Needle 31G X 5 MM MISC Use once daily with insulin 100 each 3   irbesartan (AVAPRO) 300 MG tablet TAKE 0.5 TABLETS BY MOUTH DAILY. 45 tablet 1   latanoprost (XALATAN) 0.005 % ophthalmic solution Place 1 drop into both eyes at bedtime.      nystatin ointment (MYCOSTATIN) APPLY ON THE SKIN THREE TIMES DAILY     pantoprazole (PROTONIX) 40 MG tablet TAKE 1 TABLET (40 MG TOTAL) BY MOUTH 2 (TWO) TIMES DAILY BEFORE A MEAL. 180 tablet 0   potassium chloride SA (KLOR-CON M20) 20 MEQ tablet TAKE 1 TABLET BY MOUTH TWICE A DAY 180 tablet 1   atorvastatin (LIPITOR) 20 MG tablet TAKE 1 TABLET BY MOUTH EVERY DAY 90 tablet 1   oxyCODONE-acetaminophen (PERCOCET/ROXICET) 5-325 MG tablet Take 1 tablet by mouth every 8 (eight) hours as needed for severe pain. 90 tablet 0   No facility-administered medications prior to visit.    ROS Review of Systems  Constitutional:  Negative for diaphoresis, fatigue and unexpected weight change.  HENT: Negative.    Eyes:  Negative for visual disturbance.  Respiratory:  Negative for chest tightness, shortness of breath  and wheezing.   Cardiovascular:  Negative for chest pain, palpitations and leg swelling.  Gastrointestinal:  Negative for abdominal pain, constipation, diarrhea, nausea and vomiting.  Genitourinary: Negative.  Negative for difficulty urinating.  Musculoskeletal:  Positive for arthralgias, back pain and gait problem. Negative for myalgias and neck pain.  Neurological:  Positive for dizziness and light-headedness. Negative for speech difficulty, weakness, numbness and headaches.  Hematological:  Negative for adenopathy. Does not bruise/bleed easily.  Psychiatric/Behavioral: Negative.     Objective:  BP 138/74 (BP Location: Left Arm, Patient Position:  Sitting, Cuff Size: Large)   Pulse 80   Temp 98.3 F (36.8 C) (Oral)   Resp 16   Ht '5\' 6"'$  (1.676 m)   Wt 235 lb (106.6 kg)   SpO2 98%   BMI 37.93 kg/m   BP Readings from Last 3 Encounters:  08/06/21 138/74  04/30/21 118/72  04/10/21 138/70    Wt Readings from Last 3 Encounters:  08/06/21 235 lb (106.6 kg)  04/30/21 237 lb 6.4 oz (107.7 kg)  04/10/21 240 lb (108.9 kg)    Physical Exam Vitals reviewed.  Constitutional:      Appearance: Normal appearance.  HENT:     Nose: Nose normal.     Mouth/Throat:     Mouth: Mucous membranes are moist.  Eyes:     Conjunctiva/sclera: Conjunctivae normal.  Cardiovascular:     Rate and Rhythm: Normal rate and regular rhythm.     Heart sounds: No murmur heard. Pulmonary:     Effort: Pulmonary effort is normal.     Breath sounds: No stridor. No wheezing, rhonchi or rales.  Abdominal:     General: Abdomen is protuberant. Bowel sounds are normal. There is no distension.     Palpations: Abdomen is soft. There is no hepatomegaly.  Musculoskeletal:        General: Normal range of motion.     Cervical back: Neck supple.     Right lower leg: No edema.     Left lower leg: No edema.  Lymphadenopathy:     Cervical: No cervical adenopathy.  Skin:    General: Skin is warm and dry.  Neurological:     General: No focal deficit present.     Mental Status: She is alert.     Cranial Nerves: Cranial nerves are intact.     Sensory: Sensation is intact.     Motor: Motor function is intact. No weakness, tremor or abnormal muscle tone.     Coordination: Romberg sign positive. Coordination normal. Finger-Nose-Finger Test and Heel to [facility] Test normal. Rapid alternating movements normal.     Gait: Gait is intact.     Deep Tendon Reflexes: Reflexes normal.     Reflex Scores:      Tricep reflexes are 0 on the right side and 0 on the left side.      Bicep reflexes are 0 on the right side and 0 on the left side.      Brachioradialis reflexes are 0 on  the right side and 0 on the left side.      Patellar reflexes are 0 on the right side and 0 on the left side.      Achilles reflexes are 0 on the right side and 0 on the left side.   Lab Results  Component Value Date   WBC 10.6 (H) 08/06/2021   HGB 11.2 (L) 08/06/2021   HCT 33.7 (L) 08/06/2021   PLT 388.0 08/06/2021   GLUCOSE 87  08/06/2021   CHOL 115 04/10/2021   TRIG 232.0 (H) 04/10/2021   HDL 40.40 04/10/2021   LDLDIRECT 42.0 04/10/2021   LDLCALC 43 03/28/2014   ALT 20 04/10/2021   AST 17 04/10/2021   NA 136 08/06/2021   K 3.5 08/06/2021   CL 95 (L) 08/06/2021   CREATININE 1.13 08/06/2021   BUN 13 08/06/2021   CO2 30 08/06/2021   TSH 1.64 02/06/2021   INR 1.1 (H) 04/10/2021   HGBA1C 6.7 (H) 08/06/2021   MICROALBUR 0.5 07/01/2020    MR ANKLE RIGHT WO CONTRAST  Result Date: 01/29/2021 CLINICAL DATA:  Onset right heel pain in October, 2021. No known injury. EXAM: MRI OF THE RIGHT ANKLE WITHOUT CONTRAST TECHNIQUE: Multiplanar, multisequence MR imaging of the ankle was performed. No intravenous contrast was administered. COMPARISON:  Plain films right ankle 10/31/2020 FINDINGS: TENDONS Peroneal: Intact.  Mild appearing peroneus brevis tendinosis noted. Posteromedial: Intact. Anterior: Intact. Achilles: Intact. Plantar Fascia: The medial cord is thickened and edematous and partially ruptured. Calcaneal spur noted. LIGAMENTS Lateral: Intact. The anterior talofibular ligament is attenuated compatible with remote sprain. Medial: Intact. CARTILAGE Ankle Joint: No joint effusion or osteochondral lesion of the talar dome. Subtalar Joints/Sinus Tarsi: Normal. Bones: No fracture, stress change or worrisome lesion. Other: None. 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 Mild appearing tendinosis of the peroneus brevis. Remote appearing sprain of the anterior talofibular ligament without frank tear. Electronically Signed   By: Inge Rise M.D.   On: 01/29/2021 12:41    Assessment & Plan:   Simona was seen today for follow-up.  Diagnoses and all orders for this visit:  Essential hypertension, benign- Her blood pressure is adequately well controlled. -     CBC with Differential/Platelet; Future -     CBC with Differential/Platelet  Type II diabetes mellitus with manifestations (Kleberg)- Her blood sugar is adequately well controlled. 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  Primary osteoarthritis involving multiple joints -     oxyCODONE-acetaminophen (PERCOCET/ROXICET) 5-325 MG tablet; Take 1 tablet by mouth every 8 (eight) hours as needed for severe pain.  Hyperlipidemia with target LDL less than 100- LDL goal achieved. Doing well on the statin  -     atorvastatin (LIPITOR) 20 MG tablet; Take 1 tablet (20 mg total) by mouth daily.  I have changed Pamala Hurry A. Ruud's atorvastatin. I am also having her maintain her latanoprost, aspirin, albuterol, albuterol, Insulin Pen Needle, nystatin ointment, Trulicity, Synjardy, clobetasol ointment, budesonide-formoterol, FREESTYLE TEST STRIPS, potassium chloride SA, D3, pantoprazole, gabapentin, irbesartan, ferrous sulfate, famotidine, chlorthalidone, carvedilol, and oxyCODONE-acetaminophen.  Meds ordered this encounter  Medications   oxyCODONE-acetaminophen (PERCOCET/ROXICET) 5-325 MG tablet    Sig: Take 1 tablet by mouth every 8 (eight) hours as needed for severe pain.    Dispense:  90 tablet    Refill:  0   atorvastatin (LIPITOR) 20 MG tablet     Sig: Take 1 tablet (  20 mg total) by mouth daily.    Dispense:  90 tablet    Refill:  1      Follow-up: Return if symptoms worsen or fail to improve.  [name], MD

## 2021-08-10 ENCOUNTER — Ambulatory Visit
Admission: RE | Admit: 2021-08-10 | Discharge: 2021-08-10 | Disposition: A | Discharge status: Home / Self Care | Payer: HMO | Source: Ambulatory Visit | Attending: Internal Medicine | Admitting: Internal Medicine

## 2021-08-10 DIAGNOSIS — R42 Dizziness and giddiness: Secondary | ICD-10-CM | POA: Diagnosis not present

## 2021-08-10 DIAGNOSIS — Z9889 Other specified postprocedural states: Secondary | ICD-10-CM | POA: Diagnosis not present

## 2021-08-10 DIAGNOSIS — I679 Cerebrovascular disease, unspecified: Secondary | ICD-10-CM

## 2021-08-10 DIAGNOSIS — R531 Weakness: Secondary | ICD-10-CM | POA: Diagnosis not present

## 2021-08-10 DIAGNOSIS — R270 Ataxia, unspecified: Secondary | ICD-10-CM

## 2021-08-13 NOTE — Progress Notes (Signed)
PATIENT: Valerie [name] DOB: 1946-09-22  REASON FOR VISIT: follow up HISTORY FROM: patient  Chief Complaint  Patient presents with   Follow-up    Pt alone, rm 1. DME [facility] overall doing well no concerns and states machine is working good      HISTORY OF PRESENT ILLNESS: 08/14/21 ALL:  Valerie [name] is a 75 y.o. female here today for follow up for OSA on BiPAP.  She continues to do well on BiPAP therapy. She is using her machine every night. She denies concerns with machine or supplies. She is resting well. She is followed closely by PCP.     HISTORY: (copied from Dr Dohmeier's previous note)  Interval history from 08-14-2020: Valerie [name], a 75 year old AAF , recently widowed, reports that this year has been very hard for her. She lived with her handicapped son alone, but now her 62 year old granddaughter has joined.  Her husband died with ESRD, hemodialysis, gallstones, Sepsis, and many ,many hospitalizations. He died of ischemic bowel disease. He was a full code. 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 BMI 39.87 kg/m2. Epworth score is 4 points today and her download shows high compliance: 100%  By days and time of use. 10 hours on average, 13/9 cm water. AHI : 0.0/h. High air leaks.     REVIEW OF SYSTEMS: Out of a complete 14 system review of symptoms, the patient complains only of the following symptoms, fatigue, intermittent lightheadedness and all other reviewed systems are negative.  ESS: 3  ALLERGIES: Allergies  Allergen Reactions   Food Swelling    bananas   Penicillins Swelling and Rash    HOME MEDICATIONS: Outpatient Medications Prior to Visit  Medication Sig Dispense Refill   albuterol (PROVENTIL) (2.5 MG/3ML) 0.083% nebulizer solution      albuterol (VENTOLIN HFA) 108 (90 Base) MCG/ACT inhaler Inhale 2 puffs into the lungs every 6 (six) hours as needed. For shortness of breath 3 Inhaler 4    aspirin 81 MG tablet Take 81 mg by mouth daily.      atorvastatin (LIPITOR) 20 MG tablet Take 1 tablet (20 mg total) by mouth daily. 90 tablet 1   budesonide-formoterol (SYMBICORT) 80-4.5 MCG/ACT inhaler Inhale 2 puffs into the lungs as needed.      carvedilol (COREG) 25 MG tablet TAKE 1 TABLET (25 MG TOTAL) BY MOUTH 2 (TWO) TIMES DAILY WITH A MEAL. 180 tablet 0   chlorthalidone (HYGROTON) 25 MG tablet TAKE 1 TABLET (25 MG TOTAL) BY MOUTH DAILY. 90 tablet 0   clobetasol ointment (TEMOVATE) [id number] % Apply 1 application topically 2 (two) times daily. 60 g 1   D3 50 MCG (2000 UT) TABS TAKE 1 TABLET BY MOUTH EVERY DAY 90 tablet 1   Dulaglutide (TRULICITY) 1.5 0000000 SOPN Inject 0.5 mLs (1.5 mg total) into the skin once a week. 4 pen 0   Empagliflozin-metFORMIN HCl (SYNJARDY) 12.5-500 MG TABS Take 1 tablet by mouth 2 (two) times daily. 180 tablet 3   famotidine (PEPCID) 40 MG tablet TAKE 1 TABLET BY MOUTH EVERY DAY 90 tablet 1   ferrous sulfate 325 (65 FE) MG tablet TAKE 1 TABLET BY MOUTH TWICE DAILY WITH A MEAL 180 tablet 0   gabapentin (NEURONTIN) 300 MG capsule TAKE 1 CAPSULE BY MOUTH THREE TIMES A DAY 270 capsule 0   glucose blood (FREESTYLE TEST STRIPS) test strip USE TO TEST BLOOD SUGAR 3 TIMES  A DAY. DX: E11.8 300 strip 1   Insulin Pen Needle 31G X 5 MM MISC Use once daily with insulin 100 each 3   irbesartan (AVAPRO) 300 MG tablet TAKE 0.5 TABLETS BY MOUTH DAILY. 45 tablet 1   latanoprost (XALATAN) 0.005 % ophthalmic solution Place 1 drop into both eyes at bedtime.      nystatin ointment (MYCOSTATIN) APPLY ON THE SKIN THREE TIMES DAILY     oxyCODONE-acetaminophen (PERCOCET/ROXICET) 5-325 MG tablet Take 1 tablet by mouth every 8 (eight) hours as needed for severe pain. 90 tablet 0   pantoprazole (PROTONIX) 40 MG tablet TAKE 1 TABLET (40 MG TOTAL) BY MOUTH 2 (TWO) TIMES DAILY BEFORE A MEAL. 180 tablet 0   potassium chloride SA (KLOR-CON M20) 20 MEQ tablet TAKE 1 TABLET BY MOUTH TWICE A DAY 180  tablet 1   No facility-administered medications prior to visit.    PAST MEDICAL HISTORY: Past Medical History:  Diagnosis Date   Anemia    Anxiety and depression    Arthritis    Asthma    Cataract    Degenerative arthritis    Depression    Diabetes mellitus, type 2 (HCC)    Fatigue    GERD (gastroesophageal reflux disease)    Glaucoma    Heart palpitations    Hemorrhoids    Hyperlipidemia    Hypertension    Hypothyroidism    Hypoxemia 11/24/2013   Memory deficit 10/04/2013   Obesity    Sleep apnea    BiPAP   Snoring disorder     PAST SURGICAL HISTORY: Past Surgical History:  Procedure Laterality Date   benign tumors resected     CATARACT EXTRACTION Left    FOOT SURGERY Left    bone spur   REFRACTIVE SURGERY     Glaucoma   ROTATOR CUFF REPAIR Left    TUBAL LIGATION      FAMILY HISTORY: Family History  Problem Relation Age of Onset   Alcohol abuse Mother    Heart attack Mother    Coronary artery disease Brother    Heart attack Brother    Hypertension Brother    Hyperlipidemia Brother    Heart attack Father    Hypertension Father    Hyperlipidemia Father    Heart disease Sister    Atrial fibrillation Sister    Hypertension Sister    Hyperlipidemia Sister    Hypertension Other        family history   Alcohol abuse Other    Colon cancer Brother 52   Hypertension Brother    Hyperlipidemia Brother    Dementia Brother    Hypertension Brother    Hyperlipidemia Brother    Diabetes Son    Hypertension Son    Hyperlipidemia Son    Diabetes Son    Hypertension Son    Hyperlipidemia Son    Cerebral palsy Son    Hypertension Son    Hyperlipidemia Son     SOCIAL HISTORY: Social History   Socioeconomic History   Marital status: Widowed    Spouse name: Not on file   Number of children: 3   Years of education: 11th   Highest education level: 11th grade  Occupational History   Occupation: disabled  Tobacco Use   Smoking status: Never    Smokeless tobacco: Never  Vaping Use   Vaping Use: Never used  Substance and Sexual Activity   Alcohol use: No    Alcohol/week: 0.0 standard drinks   Drug use:  No   Sexual activity: Not Currently    Comment: not working, lives with husband, 3 sons  Other Topics Concern   Not on file  Social History Narrative   Client's spouse recently passed Feb 2021    also 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   Social Determinants of Health   Financial Resource Strain: Low Risk    Difficulty of Paying Living Expenses: Not hard at all  Food Insecurity: No Food Insecurity   Worried About Charity fundraiser in the Last Year: Never true   Arboriculturist in the Last Year: Never true  Transportation Needs: Not on file  Physical Activity: Inactive   Days of Exercise per Week: 0 days   Minutes of Exercise per Session: 0 min  Stress: Stress Concern Present   Feeling of Stress : Rather much  Social Connections: Moderately Integrated   Frequency of Communication with Friends and Family: More than three times a week   Frequency of Social Gatherings with Friends and Family: Once a week   Attends Religious Services: 1 to 4 times per year   Active Member of [employer] or Organizations: No   Attends Archivist Meetings: 1 to 4 times per year   Marital Status: Widowed  Intimate Partner Violence: Not on file     PHYSICAL EXAM  Vitals:   08/14/21 1040  BP: 128/77  Pulse: 76  Weight: 231 lb (104.8 kg)  Height: '5\' 6"'$  (1.676 m)   Body mass index is 37.28 kg/m.  Generalized: Well developed, in no acute distress  Cardiology: normal rate and rhythm, no murmur noted Respiratory: clear to auscultation bilaterally  Neurological examination  Mentation: Alert oriented to time, place, history taking. Follows all commands speech and language fluent Cranial nerve II-XII: Pupils were equal round reactive to light. Extraocular movements were full, visual field were full  Motor: The motor  testing reveals 5 over 5 strength of all 4 extremities. Good symmetric motor tone is noted throughout.  Gait and station: Gait is normal.    DIAGNOSTIC DATA (LABS, IMAGING, TESTING) - I reviewed patient records, labs, notes, testing and imaging myself where available.  MMSE - Mini Mental State Exam 04/29/2016 04/29/2015 04/29/2015  Not completed: (No Data) (No Data) Unable to complete     Lab Results  Component Value Date   WBC 10.6 (H) 08/06/2021   HGB 11.2 (L) 08/06/2021   HCT 33.7 (L) 08/06/2021   MCV 88.7 08/06/2021   PLT 388.0 08/06/2021      Component Value Date/Time   NA 136 08/06/2021 1411   K 3.5 08/06/2021 1411   CL 95 (L) 08/06/2021 1411   CO2 30 08/06/2021 1411   GLUCOSE 87 08/06/2021 1411   BUN 13 08/06/2021 1411   CREATININE 1.13 08/06/2021 1411   CALCIUM 10.0 08/06/2021 1411   PROT 7.7 04/10/2021 1413   ALBUMIN 4.2 04/10/2021 1413   AST 17 04/10/2021 1413   ALT 20 04/10/2021 1413   ALKPHOS 109 04/10/2021 1413   BILITOT 0.4 04/10/2021 1413   GFRNONAA >60 08/29/2015 0630   GFRAA >60 08/29/2015 0630   Lab Results  Component Value Date   CHOL 115 04/10/2021   HDL 40.40 04/10/2021   LDLCALC 43 03/28/2014   LDLDIRECT 42.0 04/10/2021   TRIG 232.0 (H) 04/10/2021   CHOLHDL 3 04/10/2021   Lab Results  Component Value Date   HGBA1C 6.7 (H) 08/06/2021   Lab Results  Component Value Date  [id number] 04/10/2021   Lab Results  Component Value Date   TSH 1.64 02/06/2021     ASSESSMENT AND PLAN 75 y.o. year old female  has a past medical history of Anemia, Anxiety and depression, Arthritis, Asthma, Cataract, Degenerative arthritis, Depression, Diabetes mellitus, type 2 ([place]), Fatigue, GERD (gastroesophageal reflux disease), Glaucoma, Heart palpitations, Hemorrhoids, Hyperlipidemia, Hypertension, Hypothyroidism, Hypoxemia (11/24/2013), Memory deficit (10/04/2013), Obesity, Sleep apnea, and Snoring disorder. here with     ICD-10-CM   1. Obstructive sleep  apnea treated with BiPAP  G47.33 For home use only DME Bipap        Valerie [name] is doing well on BiPAP therapy. Receives last machine 2021. Compliance report reveals excellent compliance. She was encouraged to continue using BiPAP nightly and for greater than 4 hours each night. We will update supply orders as indicated. Risks of untreated sleep apnea review and education materials provided. Healthy lifestyle habits encouraged. She will follow up in 1 year, sooner if needed. She verbalizes understanding and agreement with this plan.    Orders Placed This Encounter  Procedures   For home use only DME Bipap    Supplies    Order Specific Question:   Length of Need    Answer:   Lifetime    Order Specific Question:   Inspiratory pressure    Answer:   OTHER SEE COMMENTS    Order Specific Question:   Expiratory pressure    Answer:   OTHER SEE COMMENTS      No orders of the defined types were placed in this encounter.     Debbora Presto, FNP-C 08/14/2021, 10:55 AM [facility] [address] [place], [place] [address] [phone]

## 2021-08-14 ENCOUNTER — Ambulatory Visit: Payer: HMO | Admitting: Family Medicine

## 2021-08-14 ENCOUNTER — Other Ambulatory Visit: Payer: Self-pay

## 2021-08-14 ENCOUNTER — Encounter: Payer: Self-pay | Admitting: Family Medicine

## 2021-08-14 VITALS — BP 128/77 | HR 76 | Ht 66.0 in | Wt 231.0 lb

## 2021-08-14 DIAGNOSIS — G4733 Obstructive sleep apnea (adult) (pediatric): Secondary | ICD-10-CM | POA: Diagnosis not present

## 2021-08-14 NOTE — Patient Instructions (Signed)
Please continue using your BiPAP regularly. While your insurance requires that you use BiPAP at least 4 hours each night on 70% of the nights, I recommend, that you not skip any nights and use it throughout the night if you can. Getting used to BiPAP and staying with the treatment long term does take time and patience and discipline. Untreated obstructive sleep apnea when it is moderate to severe can have an adverse impact on cardiovascular health and raise her risk for heart disease, arrhythmias, hypertension, congestive heart failure, stroke and diabetes. Untreated obstructive sleep apnea causes sleep disruption, nonrestorative sleep, and sleep deprivation. This can have an impact on your day to day functioning and cause daytime sleepiness and impairment of cognitive function, memory loss, mood disturbance, and problems focussing. Using BiPAP regularly can improve these symptoms.   Follow up with me in 1 year

## 2021-08-26 ENCOUNTER — Other Ambulatory Visit: Payer: Self-pay | Admitting: Internal Medicine

## 2021-08-26 DIAGNOSIS — E118 Type 2 diabetes mellitus with unspecified complications: Secondary | ICD-10-CM

## 2021-08-26 DIAGNOSIS — N1831 Chronic kidney disease, stage 3a: Secondary | ICD-10-CM

## 2021-08-26 MED ORDER — KERENDIA 10 MG PO TABS: 1.0000 | ORAL_TABLET | Freq: Every day | ORAL | 0 refills | Status: DC

## 2021-09-02 ENCOUNTER — Other Ambulatory Visit: Payer: Self-pay

## 2021-09-02 ENCOUNTER — Ambulatory Visit (INDEPENDENT_AMBULATORY_CARE_PROVIDER_SITE_OTHER): Payer: HMO | Admitting: Pharmacist

## 2021-09-02 DIAGNOSIS — E118 Type 2 diabetes mellitus with unspecified complications: Secondary | ICD-10-CM

## 2021-09-02 DIAGNOSIS — E785 Hyperlipidemia, unspecified: Secondary | ICD-10-CM

## 2021-09-02 DIAGNOSIS — I10 Essential (primary) hypertension: Secondary | ICD-10-CM

## 2021-09-02 DIAGNOSIS — N1831 Chronic kidney disease, stage 3a: Secondary | ICD-10-CM

## 2021-09-02 NOTE — Patient Instructions (Signed)
Visit Information  Phone number for Pharmacist: [phone]   Goals Addressed             This Visit's Progress    Manage My Medicine   On track    Timeframe:  Long-Range Goal Priority:  Medium Start Date:   05/30/21                          Expected End Date:    05/30/22                   Follow Up Date: Dec 2022   - call for medicine refill 2 or 3 days before it runs out - call if I am sick and can't take my medicine - keep a list of all the medicines I take; vitamins and herbals too - use a pillbox to sort medicine    Why is this important?   These steps will help you keep on track with your medicines.   Notes:         Care Plan : [facility]  Updates made by Charlton Haws, RPH since 09/02/2021 12:00 AM     Problem: Hypertension, Hyperlipidemia, Diabetes, Asthma, and Pain   Priority: High     Long-Range Goal: Disease management   Start Date: 05/30/2021  Expected End Date: 05/30/2022  This Visit's Progress: On track  Recent Progress: On track  Priority: High  Note:   Current Barriers:  Unable to independently monitor therapeutic efficacy  Pharmacist Clinical Goal(s):  Patient will achieve adherence to monitoring guidelines and medication adherence to achieve therapeutic efficacy through collaboration with PharmD and provider.   Interventions: 1:1 collaboration with Janith Lima, MD regarding development and update of comprehensive plan of care as evidenced by provider attestation and co-signature Inter-disciplinary care team collaboration (see longitudinal plan of care) Comprehensive medication review performed; medication list updated in electronic medical record  Hypertension  BP goal is:  <130/80 Patient checks BP at home daily Patient home BP readings are ranging: 125/70   Patient has failed these meds in the past: n/a Patient is currently controlled on the following medications: Irbesartan 300 mg - 1/2 tablet daily (pt takes  when BP > 160/100, occurs once or twice a month) Chlorthalidone 25 mg daily Carvedilol 25 mg BID    We discussed: Pt reports BP is at goal at home; she endorses compliance and denies issues   Plan: Recommend to continue current medication   Hyperlipidemia    LDL goal < 100 Significant family history of MI, no personal hx of MI or stroke  Patient has failed these meds in past: n/a Patient is currently controlled on the following medications: Atorvastatin 20 mg daily HS Aspirin 81 mg daily   We discussed:  LDL is at goal; pt endorses compliance and denies issues   Plan: Continue current medications   Diabetes    A1c goal <7% Checking BG: Daily Recent FBG Readings: 130-140   Patient has failed these meds in past: Invokana, Farxiga, Janumet, pioglitazone, nateglinide Patient is currently controlled on the following medications: Trulicity 1.5 mg weekly Friday (via PAP) Synjardy 12.5-500 mg BID (via PAP)   We discussed: A1c is at goal; pt endorses compliance and denies issues   Plan: Continue current medications and control with diet and exercise    CKD Stage 3a  Patient is currently controlled on the following medications:  Kerendia 10 mg daily  We discussed:  CKD course and progression; emphasized importance of BP and DM control as well as hyrdration; advised to avoid NSAIDs; pt has f/u appt with PCP next month for kidney labs and Kerendia dose escalation  Plan: Continue current medications  Patient Goals/Self-Care Activities Patient will:  - take medications as prescribed -focus on medication adherence by pill box -check glucose daily -check blood pressure daily      The patient verbalized understanding of instructions, educational materials, and care plan provided today and declined offer to receive copy of patient instructions, educational materials, and care plan.  Telephone follow up appointment with pharmacy team member scheduled for: 3 months  Charlene Brooke, PharmD, [place], CPP Clinical Pharmacist [facility] [phone]

## 2021-09-02 NOTE — Progress Notes (Signed)
Chronic Care Management Pharmacy Note  09/02/2021 Name:  Valerie [name] MRN:  [id number] DOB:  July 06, 1946  Summary: -Pt picked up Carrington Clamp and is taking it without issue  Recommendations/Changes made from today's visit: -Counseled on CKD course and progression; advised to maintain adequate hydration, avoid NSAIDs, monitor BP and BG closely   Subjective: Valerie [name] is an 75 y.o. year old female who is a primary patient of Janith Lima, MD.  The CCM team was consulted for assistance with disease management and care coordination needs.    Engaged with patient by telephone for follow up visit in response to provider referral for pharmacy case management and/or care coordination services.   Consent to Services:  The patient was given information about Chronic Care Management services, agreed to services, and gave verbal consent prior to initiation of services.  Please see initial visit note for detailed documentation.   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 Her husband passed in 2021 and it has been very difficult for her and her disabled son she cares for.  Recent office visits: 08/06/21 Dr Ronnald Ramp OV: chronic f/u; c/o 3 day hx of ataxia, dizziness. Ordered MRI. Rx'd Carrington Clamp 9/13 d/t kidney concerns.  04/10/21 Dr Ronnald Ramp OV: chronic f/u; labs stable; H/H chronically low  Recent consult visits: 08/14/21 NP Amy Lomax (neurology): f/u OSA  04/30/21 PA Meng (cardiology): f/u palpitations; hx atypical chest pain, previous workup negative. Dc'd Myrbetriq, Gemtesa (pt not taking).  Hospital  visits: None in previous 6 months   Objective:  Lab Results  Component Value Date   CREATININE 1.13 08/06/2021   BUN 13 08/06/2021   GFR 48.29 (L) 08/06/2021   GFRNONAA >60 08/29/2015   GFRAA >60 08/29/2015   NA 136 08/06/2021   K 3.5 08/06/2021   CALCIUM 10.0 08/06/2021   CO2 30 08/06/2021   GLUCOSE 87 08/06/2021    Lab Results  Component Value Date/Time   HGBA1C 6.7 (H) 08/06/2021 02:11 PM   HGBA1C 6.9 (H) 04/10/2021 02:13 PM   GFR 48.29 (L) 08/06/2021 02:11 PM   GFR 61.22 02/10/2021 12:42 PM   MICROALBUR 0.5 07/01/2020 11:43 AM   MICROALBUR 0.8 05/04/2019 12:12 PM    Last diabetic Eye exam:  Lab Results  Component Value Date/Time   HMDIABEYEEXA No Retinopathy 03/11/2021 12:00 AM    Last diabetic Foot exam:  Lab Results  Component Value Date/Time   HMDIABFOOTEX done 06/20/2018 12:00 AM     Lab Results  Component Value Date   CHOL 115 04/10/2021   HDL 40.40 04/10/2021   LDLCALC 43 03/28/2014   LDLDIRECT 42.0 04/10/2021   TRIG 232.0 (H) 04/10/2021   CHOLHDL 3 04/10/2021    Hepatic Function Latest Ref Rng & Units 04/10/2021 02/21/2020 09/13/2019  Total Protein 6.0 - 8.3 g/dL 7.7 7.7 7.9  Albumin 3.5 - 5.2 g/dL 4.2 4.2 4.5  AST 0 - 37 U/L '17 18 16  ' ALT 0 - 35 U/L '20 18 18  ' Alk Phosphatase 39 - 117 U/L 109 95 90  Total Bilirubin 0.2 - 1.2 mg/dL 0.4 0.6 0.5  Bilirubin, Direct 0.0 - 0.3 mg/dL  0.1 0.0 0.1    Lab Results  Component Value Date/Time   TSH 1.64 02/06/2021 01:53 PM   TSH 3.79 07/01/2020 11:43 AM    CBC Latest Ref Rng & Units 08/06/2021 04/10/2021 02/10/2021  WBC 4.0 - 10.5 K/uL 10.6(H) 9.2 10.2  Hemoglobin 12.0 - 15.0 g/dL 11.2(L) 10.8(L) 11.0(L)  Hematocrit 36.0 - 46.0 % 33.7(L) 32.6(L) 33.0(L)  Platelets 150.0 - 400.0 K/uL 388.0 368.0 332.0   Iron/TIBC/Ferritin/ %Sat    Component Value Date/Time   IRON 73 04/10/2021 1413   TIBC 356 07/01/2020 1146   FERRITIN 68.2 04/10/2021 1413   IRONPCTSAT 23 07/01/2020 1146     Lab Results   Component Value Date/Time   VD25OH 36 07/01/2020 11:43 AM   VD25OH 66.29 02/21/2020 02:22 PM   VD25OH 82.13 09/13/2019 04:58 PM    Clinical ASCVD: No  The ASCVD Risk score (Arnett DK, et al., 2019) failed to calculate for the following reasons:   The patient has a prior MI or stroke diagnosis    Depression screen [facility] 2/9 02/27/2021 05/27/2020 03/18/2020  Decreased Interest '1 1 1  ' Down, Depressed, Hopeless '3 1 2  ' PHQ - 2 Score '4 2 3  ' Altered sleeping '3 2 2  ' Tired, decreased energy '3 2 2  ' Change in appetite '3 2 2  ' Feeling bad or failure about yourself  0 0 3  Trouble concentrating '3 3 3  ' Moving slowly or fidgety/restless 0 1 0  Suicidal thoughts 0 0 0  PHQ-9 Score '16 12 15  ' Difficult doing work/chores Somewhat difficult Very difficult Very difficult  Some recent data might be hidden      Social History   Tobacco Use  Smoking Status Never  Smokeless Tobacco Never   BP Readings from Last 3 Encounters:  08/14/21 128/77  08/06/21 138/74  04/30/21 118/72   Pulse Readings from Last 3 Encounters:  08/14/21 76  08/06/21 80  04/30/21 81   Wt Readings from Last 3 Encounters:  08/14/21 231 lb (104.8 kg)  08/06/21 235 lb (106.6 kg)  04/30/21 237 lb 6.4 oz (107.7 kg)   BMI Readings from Last 3 Encounters:  08/14/21 37.28 kg/m  08/06/21 37.93 kg/m  04/30/21 38.32 kg/m    Assessment/Interventions: Review of patient past medical history, allergies, medications, health status, including review of consultants reports, laboratory and other test data, was performed as part of comprehensive evaluation and provision of chronic care management services.   SDOH:  (Social Determinants of Health) assessments and interventions performed: Yes  SDOH Screenings   Alcohol Screen: Low Risk    Last Alcohol Screening Score (AUDIT): 0  Depression (PHQ2-9): Medium Risk   PHQ-2 Score: 16  Financial Resource Strain: Low Risk    Difficulty of Paying Living Expenses: Not hard at all  Food  Insecurity: No Food Insecurity   Worried About Charity fundraiser in the Last Year: Never true   Ran Out of Food in the Last Year: Never true  Housing: Low Risk    Last Housing Risk Score: 0  Physical Activity: Inactive   Days of Exercise per Week: 0 days   Minutes of Exercise per Session: 0 min  Social Connections: Moderately Integrated   Frequency of Communication with Friends and Family: More than three times a week   Frequency of Social Gatherings with Friends and Family: Once a week   Attends Religious Services: 1 to 4 times per year   Active Member of Clubs or Organizations: No   Attends  Club or Organization Meetings: 1 to 4 times per year   Marital Status: Widowed  Stress: Stress Concern Present   Feeling of Stress : Rather much  Tobacco Use: Low Risk    Smoking Tobacco Use: Never   Smokeless Tobacco Use: Never  Transportation Needs: Not on file    CCM Care Plan  Allergies  Allergen Reactions   Food Swelling    bananas   Penicillins Swelling and Rash    Medications Reviewed Today     Reviewed by Charlton Haws, [facility] (Pharmacist) on 09/02/21 at 1330  Med List Status: <None>   Medication Order Taking? Sig Documenting Provider Last Dose Status Informant  albuterol (PROVENTIL) (2.5 MG/3ML) 0.083% nebulizer solution [id number] Yes  [provider] Taking Active            Med Note Kellie Simmering, [place]   Wed Apr 30, 2021  1:44 PM)    albuterol (VENTOLIN HFA) 108 (90 Base) MCG/ACT inhaler [id number] Yes Inhale 2 puffs into the lungs every 6 (six) hours as needed. For shortness of breath Janith Lima, MD Taking Active            Med Note ([place]   Tue Jul 02, 2020 10:27 AM)    aspirin 81 MG tablet [id number] Yes Take 81 mg by mouth daily.  [provider] Taking Active Self  atorvastatin (LIPITOR) 20 MG tablet [id number] Yes Take 1 tablet (20 mg total) by mouth daily. Janith Lima, MD Taking Active   budesonide-formoterol [facility]) 80-4.5  MCG/ACT inhaler [id number] Yes Inhale 2 puffs into the lungs as needed.  [provider] Taking Active            Med Note Alvin Critchley   Wed Apr 30, 2021  1:44 PM)    carvedilol (COREG) 25 MG tablet [id number] Yes TAKE 1 TABLET (25 MG TOTAL) BY MOUTH 2 (TWO) TIMES DAILY WITH A MEAL. Janith Lima, MD Taking Active   chlorthalidone (HYGROTON) 25 MG tablet [id number] Yes TAKE 1 TABLET (25 MG TOTAL) BY MOUTH DAILY. Janith Lima, MD Taking Active   clobetasol ointment (TEMOVATE) 0.05 % [id number] Yes Apply 1 application topically 2 (two) times daily. Janith Lima, MD Taking Active   D3 50 MCG [phone] UT) TABS [id number] Yes TAKE 1 TABLET BY MOUTH EVERY DAY Janith Lima, MD Taking Active   Dulaglutide (TRULICITY) 1.5 TX/6.4WO SOPN [id number] Yes Inject 0.5 mLs (1.5 mg total) into the skin once a week. Janith Lima, MD Taking Active            Med Note Alvin Critchley   Wed Apr 30, 2021  1:44 PM)    Empagliflozin-metFORMIN HCl [facility] 12.5-500 MG TABS [id number] Yes Take 1 tablet by mouth 2 (two) times daily. Janith Lima, MD Taking Active            Med Note Elyse Hsu Apr 30, 2021  1:44 PM)    famotidine (PEPCID) 40 MG tablet [id number] Yes TAKE 1 TABLET BY MOUTH EVERY DAY Janith Lima, MD Taking Active   ferrous sulfate 325 (65 FE) MG tablet [id number] Yes TAKE 1 TABLET BY MOUTH TWICE DAILY WITH A MEAL Janith Lima, MD Taking Active   Finerenone [facility]) 10 MG TABS [id number] Yes Take 1 tablet by mouth daily. Janith Lima, MD Taking Active   gabapentin (NEURONTIN) 300 MG capsule [id number] Yes TAKE 1 CAPSULE BY MOUTH THREE TIMES A DAY [name]  L, MD Taking Active   glucose blood (FREESTYLE TEST STRIPS) test strip [id number] Yes USE TO TEST BLOOD SUGAR 3 TIMES A DAY. DX: E11.8 Janith Lima, MD Taking Active   Insulin Pen Needle 31G X 5 MM MISC [id number] Yes Use once daily with insulin Janith Lima, MD Taking Active   irbesartan (AVAPRO) 300 MG  tablet [id number] Yes TAKE 0.5 TABLETS BY MOUTH DAILY. Janith Lima, MD Taking Active   latanoprost (XALATAN) 0.005 % ophthalmic solution [id number] Yes Place 1 drop into both eyes at bedtime.  [provider] Taking Active Self  nystatin ointment (MYCOSTATIN) [id number] Yes APPLY ON THE SKIN THREE TIMES DAILY [provider] Taking Active   oxyCODONE-acetaminophen (PERCOCET/ROXICET) 5-325 MG tablet [id number] Yes Take 1 tablet by mouth every 8 (eight) hours as needed for severe pain. Janith Lima, MD Taking Active   pantoprazole (PROTONIX) 40 MG tablet [id number] Yes TAKE 1 TABLET (40 MG TOTAL) BY MOUTH 2 (TWO) TIMES DAILY BEFORE A MEAL. 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 08/06/2021   Tinea corporis 10/01/2020   Degenerative arthritis of right knee 07/02/2020   Moderate episode of recurrent major depressive disorder ([place]) 07/01/2020   Esophageal dysphagia 05/27/2020   Gastroesophageal reflux disease with esophagitis without hemorrhage 05/27/2020   Diuretic-induced hypokalemia 03/27/2020   OAB (overactive bladder) 03/27/2020   Insomnia secondary to chronic pain 02/21/2020   Primary osteoarthritis involving multiple joints 10/10/2019   Thiamine deficiency, unspecified 01/09/2019   Eczema 08/18/2018   Palpitations 08/18/2018   Vitamin D deficiency disease 08/11/2018   Long-term current use of opiate analgesic 06/21/2018   External bleeding hemorrhoids 03/24/2018   Obstructive sleep apnea treated with BiPAP 11/03/2017   Osteopenia 03/28/2014   Insomnia 03/28/2014   Routine general medical examination at a health care facility 07/17/2013   Morbid obesity with BMI of 45.0-49.9, adult  ([place]) 04/14/2013   Visit for screening mammogram 01/20/2013   Spinal stenosis of lumbar 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 10/05/2012   Hypothyroidism 02/20/2011   Fatty liver disease, nonalcoholic 22/97/9892   Mild intermittent asthma 10/25/2009   Type II diabetes mellitus with manifestations ([place]) 05/06/2009   Hyperlipidemia with target LDL less than 100 05/06/2009   B12 deficiency anemia 05/06/2009   Depression with anxiety 05/06/2009   Essential hypertension, benign 05/06/2009   GERD 05/06/2009    Immunization History  Administered Date(s) Administered   Fluad Quad(high Dose 65+) 09/13/2019   Influenza Split 09/21/2011, 08/19/2012   Influenza Whole 09/25/2009, 09/04/2010   Influenza, High Dose Seasonal PF 09/23/2016, 10/06/2017, 08/18/2018   Influenza,inj,Quad PF,6+ Mos 09/01/2013, 11/29/2014, 08/30/2015, 08/13/2020   PFIZER(Purple Top)SARS-COV-2 Vaccination 02/11/2020, 03/06/2020, 09/19/2020   Pneumococcal Conjugate-13 07/13/2014   Pneumococcal Polysaccharide-23 01/20/2013, 04/18/2018   Td 02/05/2010   Tdap 06/02/2017   Zoster Recombinat (Shingrix) 08/18/2018, 01/04/2019    Conditions to be addressed/monitored:  Hypertension, Hyperlipidemia, Diabetes, Asthma, and Pain  Care Plan : [place]  Updates made by Charlton Haws, [place] since 09/02/2021 12:00 AM     Problem: Hypertension, Hyperlipidemia, Diabetes, Asthma, and Pain   Priority: High     Long-Range Goal: Disease  management   Start Date: 05/30/2021  Expected End Date: 05/30/2022  This Visit's Progress: On track  Recent Progress: On track  Priority: High  Note:   Current Barriers:  Unable to independently monitor therapeutic efficacy  Pharmacist Clinical Goal(s):  Patient will achieve adherence to monitoring guidelines and medication adherence to achieve therapeutic efficacy through collaboration with PharmD and provider.   Interventions: 1:1 collaboration with Janith Lima, MD regarding  development and update of comprehensive plan of care as evidenced by provider attestation and co-signature Inter-disciplinary care team collaboration (see longitudinal plan of care) Comprehensive medication review performed; medication list updated in electronic medical record  Hypertension  BP goal is:  <130/80 Patient checks BP at home daily Patient home BP readings are ranging: 125/70   Patient has failed these meds in the past: n/a Patient is currently controlled on the following medications: Irbesartan 300 mg - 1/2 tablet daily (pt takes when BP > 160/100, occurs once or twice a month) Chlorthalidone 25 mg daily Carvedilol 25 mg BID    We discussed: Pt reports BP is at goal at home; she endorses compliance and denies issues   Plan: Recommend to continue current medication   Hyperlipidemia    LDL goal < 100 Significant family history of MI, no personal hx of MI or stroke  Patient has failed these meds in past: n/a Patient is currently controlled on the following medications: Atorvastatin 20 mg daily HS Aspirin 81 mg daily   We discussed:  LDL is at goal; pt endorses compliance and denies issues   Plan: Continue current medications   Diabetes    A1c goal <7% Checking BG: Daily Recent FBG Readings: 130-140   Patient has failed these meds in past: Invokana, Farxiga, Janumet, pioglitazone, nateglinide Patient is currently controlled on the following medications: Trulicity 1.5 mg weekly Friday (via PAP) Synjardy 12.5-500 mg BID (via PAP)   We discussed: A1c is at goal; pt endorses compliance and denies issues   Plan: Continue current medications and control with diet and exercise    CKD Stage 3a  Patient is currently controlled on the following medications:  Kerendia 10 mg daily  We discussed:  CKD course and progression; emphasized importance of BP and DM control as well as hyrdration; advised to avoid NSAIDs; pt has f/u appt with PCP next month for kidney labs and  Kerendia dose escalation  Plan: Continue current medications  Patient Goals/Self-Care Activities Patient will:  - take medications as prescribed -focus on medication adherence by pill box -check glucose daily -check blood pressure daily       Medication Assistance:  Synjardy ([employer]) - approved through 41/93/79 Trulicity [employer] - approved through 12/13/21 Symbicort (AZ&Me) - approved through 12/13/21   Compliance/Adherence/Medication fill history: Care Gaps: Foot exam (07/01/21)  Star-Rating Drugs: Atorvastatin - LF 08/04/21 x 90 ds Irbesartan - LF 06/22/21 x 90 ds  Patient's preferred pharmacy is:  [id number] [place] [address] [address]Phone: [phone]: [phone] [place] [address] [place] [address]Phone: [phone]: [phone] Uses pill box? Yes Pt endorses 100% compliance  We discussed: Current pharmacy is preferred with insurance plan and patient is satisfied with pharmacy services Patient decided to: Continue current medication management strategy  Care Plan and Follow Up Patient Decision:  Patient agrees to Care Plan and Follow-up.  Plan: Telephone follow  up appointment with care management team member scheduled for:  3 months  Charlene Brooke, PharmD, [place], CPP Clinical Pharmacist [facility] [phone]

## 2021-09-12 DIAGNOSIS — E785 Hyperlipidemia, unspecified: Secondary | ICD-10-CM

## 2021-09-12 DIAGNOSIS — I10 Essential (primary) hypertension: Secondary | ICD-10-CM

## 2021-09-12 DIAGNOSIS — E118 Type 2 diabetes mellitus with unspecified complications: Secondary | ICD-10-CM | POA: Diagnosis not present

## 2021-09-12 DIAGNOSIS — N1831 Chronic kidney disease, stage 3a: Secondary | ICD-10-CM

## 2021-09-25 ENCOUNTER — Encounter: Payer: Self-pay | Admitting: Internal Medicine

## 2021-09-25 ENCOUNTER — Ambulatory Visit (INDEPENDENT_AMBULATORY_CARE_PROVIDER_SITE_OTHER): Payer: HMO | Admitting: Internal Medicine

## 2021-09-25 ENCOUNTER — Other Ambulatory Visit: Payer: Self-pay

## 2021-09-25 ENCOUNTER — Other Ambulatory Visit: Payer: Self-pay | Admitting: Internal Medicine

## 2021-09-25 VITALS — BP 138/78 | HR 87 | Temp 97.9°F | Resp 16 | Ht 66.0 in | Wt 235.0 lb

## 2021-09-25 DIAGNOSIS — E118 Type 2 diabetes mellitus with unspecified complications: Secondary | ICD-10-CM | POA: Diagnosis not present

## 2021-09-25 DIAGNOSIS — I10 Essential (primary) hypertension: Secondary | ICD-10-CM

## 2021-09-25 DIAGNOSIS — N1831 Chronic kidney disease, stage 3a: Secondary | ICD-10-CM

## 2021-09-25 DIAGNOSIS — Z23 Encounter for immunization: Secondary | ICD-10-CM

## 2021-09-25 LAB — MICROALBUMIN / CREATININE URINE RATIO
Creatinine,U: 68.3 mg/dL
Microalb Creat Ratio: 1 mg/g (ref 0.0–30.0)
Microalb, Ur: <0.7 mg/dL (ref 0.0–1.9)

## 2021-09-25 LAB — URINALYSIS, ROUTINE W REFLEX MICROSCOPIC
Bilirubin Urine: NEGATIVE
Hgb urine dipstick: NEGATIVE
Ketones, ur: NEGATIVE
Nitrite: NEGATIVE
Specific Gravity, Urine: <=1.005 — AB (ref 1.000–1.030)
Total Protein, Urine: NEGATIVE
Urine Glucose: >=1000 — AB
Urobilinogen, UA: 0.2 (ref 0.0–1.0)
pH: 6 (ref 5.0–8.0)

## 2021-09-25 LAB — BASIC METABOLIC PANEL
BUN: 15 mg/dL (ref 6–23)
CO2: 31 mEq/L (ref 19–32)
Calcium: 9.7 mg/dL (ref 8.4–10.5)
Chloride: 97 mEq/L (ref 96–112)
Creatinine, Ser: 1.01 mg/dL (ref 0.40–1.20)
GFR: 55.21 mL/min — ABNORMAL LOW (ref >60.00)
Glucose, Bld: 115 mg/dL — ABNORMAL HIGH (ref 70–99)
Potassium: 3.5 mEq/L (ref 3.5–5.1)
Sodium: 138 mEq/L (ref 135–145)

## 2021-09-25 MED ORDER — KERENDIA 20 MG PO TABS: 1.0000 | ORAL_TABLET | Freq: Every day | ORAL | 1 refills | Status: DC

## 2021-09-25 NOTE — Patient Instructions (Signed)

## 2021-09-25 NOTE — Progress Notes (Signed)
Subjective:  Patient ID: Valerie [name], female    DOB: [birth year]  Age: 75 y.o. MRN: [id number]  CC: Hypertension and Diabetes  This visit occurred during the SARS-CoV-2 public health emergency.  Safety protocols were in place, including screening questions prior to the visit, additional usage of staff PPE, and extensive cleaning of exam room while observing appropriate contact time as indicated for disinfecting solutions.    HPI Valerie [name] presents for f/up -  She has felt well recently. Offers no complaints.  Outpatient Medications Prior to Visit  Medication Sig Dispense Refill   albuterol (PROVENTIL) (2.5 MG/3ML) 0.083% nebulizer solution      albuterol (VENTOLIN HFA) 108 (90 Base) MCG/ACT inhaler Inhale 2 puffs into the lungs every 6 (six) hours as needed. For shortness of breath 3 Inhaler 4   aspirin 81 MG tablet Take 81 mg by mouth daily.      atorvastatin (LIPITOR) 20 MG tablet Take 1 tablet (20 mg total) by mouth daily. 90 tablet 1   budesonide-formoterol (SYMBICORT) 80-4.5 MCG/ACT inhaler Inhale 2 puffs into the lungs as needed.      carvedilol (COREG) 25 MG tablet TAKE 1 TABLET (25 MG TOTAL) BY MOUTH 2 (TWO) TIMES DAILY WITH A MEAL. 180 tablet 0   chlorthalidone (HYGROTON) 25 MG tablet TAKE 1 TABLET (25 MG TOTAL) BY MOUTH DAILY. 90 tablet 0   clobetasol ointment (TEMOVATE) 1.75 % Apply 1 application topically 2 (two) times daily. 60 g 1   D3 50 MCG (2000 UT) TABS TAKE 1 TABLET BY MOUTH EVERY DAY 90 tablet 1   Dulaglutide (TRULICITY) 1.5 ZW/2.5EN SOPN Inject 0.5 mLs (1.5 mg total) into the skin once a week. 4 pen 0   Empagliflozin-metFORMIN HCl (SYNJARDY) 12.5-500 MG TABS Take 1 tablet by mouth 2 (two) times daily. 180 tablet 3   famotidine (PEPCID) 40 MG tablet TAKE 1 TABLET BY MOUTH EVERY DAY 90 tablet 1   ferrous sulfate 325 (65 FE) MG tablet TAKE 1 TABLET BY MOUTH TWICE DAILY WITH A MEAL 180 tablet 0   gabapentin (NEURONTIN) 300 MG capsule TAKE 1 CAPSULE BY MOUTH  THREE TIMES A DAY 270 capsule 0   glucose blood (FREESTYLE TEST STRIPS) test strip USE TO TEST BLOOD SUGAR 3 TIMES A DAY. DX: E11.8 300 strip 1   Insulin Pen Needle 31G X 5 MM MISC Use once daily with insulin 100 each 3   irbesartan (AVAPRO) 300 MG tablet TAKE 0.5 TABLETS BY MOUTH DAILY. 45 tablet 1   latanoprost (XALATAN) 0.005 % ophthalmic solution Place 1 drop into both eyes at bedtime.      nystatin ointment (MYCOSTATIN) APPLY ON THE SKIN THREE TIMES DAILY     oxyCODONE-acetaminophen (PERCOCET/ROXICET) 5-325 MG tablet Take 1 tablet by mouth every 8 (eight) hours as needed for severe pain. 90 tablet 0   pantoprazole (PROTONIX) 40 MG tablet TAKE 1 TABLET (40 MG TOTAL) BY MOUTH 2 (TWO) TIMES DAILY BEFORE A MEAL. 180 tablet 0   potassium chloride SA (KLOR-CON M20) 20 MEQ tablet TAKE 1 TABLET BY MOUTH TWICE A DAY 180 tablet 1   Finerenone (KERENDIA) 10 MG TABS Take 1 tablet by mouth daily. 30 tablet 0   No facility-administered medications prior to visit.    ROS Review of Systems  Constitutional:  Negative for diaphoresis and fatigue.  HENT: Negative.    Eyes: Negative.   Respiratory:  Negative for cough, chest tightness, shortness of breath and wheezing.   Cardiovascular:  Negative  for chest pain, palpitations and leg swelling.  Gastrointestinal:  Negative for abdominal pain, constipation, diarrhea, nausea and vomiting.  Endocrine: Negative.   Genitourinary: Negative.  Negative for difficulty urinating.  Musculoskeletal:  Positive for back pain. Negative for myalgias.  Skin: Negative.   Neurological:  Negative for dizziness, weakness and light-headedness.  Hematological:  Negative for adenopathy. Does not bruise/bleed easily.  Psychiatric/Behavioral: Negative.     Objective:  BP 138/78 (BP Location: Left Arm, Patient Position: Sitting, Cuff Size: Large)   Pulse 87   Temp 97.9 F (36.6 C) (Oral)   Resp 16   Ht 5\' 6"  (1.676 m)   Wt 235 lb (106.6 kg)   SpO2 97%   BMI 37.93 kg/m    BP Readings from Last 3 Encounters:  09/25/21 138/78  08/14/21 128/77  08/06/21 138/74    Wt Readings from Last 3 Encounters:  09/25/21 235 lb (106.6 kg)  08/14/21 231 lb (104.8 kg)  08/06/21 235 lb (106.6 kg)    Physical Exam Vitals reviewed.  Constitutional:      Appearance: She is obese.  HENT:     Nose: Nose normal.     Mouth/Throat:     Mouth: Mucous membranes are moist.  Eyes:     Conjunctiva/sclera: Conjunctivae normal.  Cardiovascular:     Rate and Rhythm: Normal rate and regular rhythm.     Heart sounds: No murmur heard. Pulmonary:     Effort: Pulmonary effort is normal.     Breath sounds: No stridor. No wheezing, rhonchi or rales.  Abdominal:     General: Abdomen is protuberant. Bowel sounds are normal. There is no distension.     Palpations: Abdomen is soft. There is no hepatomegaly, splenomegaly or mass.  Musculoskeletal:        General: Normal range of motion.     Cervical back: Neck supple.     Right lower leg: No edema.  Lymphadenopathy:     Cervical: No cervical adenopathy.  Skin:    General: Skin is warm.  Neurological:     General: No focal deficit present.     Mental Status: She is alert.  Psychiatric:        Mood and Affect: Mood normal.        Behavior: Behavior normal.    Lab Results  Component Value Date   WBC 10.6 (H) 08/06/2021   HGB 11.2 (L) 08/06/2021   HCT 33.7 (L) 08/06/2021   PLT 388.0 08/06/2021   GLUCOSE 115 (H) 09/25/2021   CHOL 115 04/10/2021   TRIG 232.0 (H) 04/10/2021   HDL 40.40 04/10/2021   LDLDIRECT 42.0 04/10/2021   LDLCALC 43 03/28/2014   ALT 20 04/10/2021   AST 17 04/10/2021   NA 138 09/25/2021   K 3.5 09/25/2021   CL 97 09/25/2021   CREATININE 1.01 09/25/2021   BUN 15 09/25/2021   CO2 31 09/25/2021   TSH 1.64 02/06/2021   INR 1.1 (H) 04/10/2021   HGBA1C 6.7 (H) 08/06/2021   MICROALBUR <0.7 09/25/2021    MR Brain Wo Contrast  Result Date: 08/10/2021 CLINICAL DATA:  75 year old female with  dizziness, weakness, unbalance for a few weeks. No known injury. EXAM: MRI HEAD WITHOUT CONTRAST TECHNIQUE: Multiplanar, multiecho pulse sequences of the brain and surrounding structures were obtained without intravenous contrast. COMPARISON:  Brain MRI 02/27/2013, and earlier. FINDINGS: Brain: No restricted diffusion to suggest acute infarction. No midline shift, mass effect, evidence of mass lesion, ventriculomegaly, extra-axial collection or acute intracranial hemorrhage. Cervicomedullary  junction and pituitary are within normal limits. Cerebral volume is not significantly changed since 2014, normal for age. And scattered small foci of nonspecific cerebral white matter T2 and FLAIR hyperintensity remain mild for age, minimally progressed. No cortical encephalomalacia or chronic cerebral blood products identified. Deep gray matter nuclei, brainstem and cerebellum appear normal. Vascular: Major intracranial vascular flow voids are stable. Skull and upper cervical spine: Negative for age visible cervical spine. Stable bone marrow signal since 2014. Sinuses/Orbits: Interval postoperative changes to the right globe. Otherwise negative orbits. Paranasal Visualized paranasal sinuses and mastoids are stable and well aerated. Other: Grossly normal visible internal auditory structures. Normal stylomastoid foramina. Small midline nasopharyngeal retention cyst (Tornwaldt cyst, normal variant). Negative visible scalp and face soft tissues. IMPRESSION: 1. No acute intracranial abnormality. 2. Noncontrast MRI appearance of the brain is largely stable since 2014 and unremarkable for age. Electronically Signed   By: Genevie Ann M.D.   On: 08/10/2021 11:21    Assessment & Plan:   Letecia was seen today for hypertension and diabetes.  Diagnoses and all orders for this visit:  Flu vaccine need -     Flu Vaccine QUAD High Dose(Fluad)  Type II diabetes mellitus with manifestations ([place])- Her blood sugar is adequately well  controlled -     HM Diabetes Foot Exam -     Basic metabolic panel; Future -     Microalbumin / creatinine urine ratio; Future -     Urinalysis, Routine w reflex microscopic; Future -     Urinalysis, Routine w reflex microscopic -     Microalbumin / creatinine urine ratio -     Basic metabolic panel -     Finerenone (KERENDIA) 20 MG TABS; Take 1 tablet by mouth daily.  Essential hypertension, benign- Her BP is well controlled. -     Basic metabolic panel; Future -     Urinalysis, Routine w reflex microscopic; Future -     Urinalysis, Routine w reflex microscopic -     Basic metabolic panel  Stage 3a chronic kidney disease ([place])- Her K+ is normal. Will increase the dose of kerendia for renal  protection. -     Basic metabolic panel; Future -     Microalbumin / creatinine urine ratio; Future -     Urinalysis, Routine w reflex microscopic; Future -     Urinalysis, Routine w reflex microscopic -     Microalbumin / creatinine urine ratio -     Basic metabolic panel -     Finerenone (KERENDIA) 20 MG TABS; Take 1 tablet by mouth daily.  I have discontinued Pamala Hurry A. Kosloski's [place] Clamp. I am also having her start on Kerendia. Additionally, I am having her maintain her latanoprost, aspirin, albuterol, albuterol, Insulin Pen Needle, nystatin ointment, Trulicity, Synjardy, clobetasol ointment, budesonide-formoterol, FREESTYLE TEST STRIPS, potassium chloride SA, D3, pantoprazole, gabapentin, irbesartan, ferrous sulfate, famotidine, chlorthalidone, carvedilol, oxyCODONE-acetaminophen, and atorvastatin.  Meds ordered this encounter  Medications   Finerenone (KERENDIA) 20 MG TABS    Sig: Take 1 tablet by mouth daily.    Dispense:  90 tablet    Refill:  1     Follow-up: Return in about 4 months (around 01/26/2022).  [name], MD

## 2021-09-26 ENCOUNTER — Encounter: Payer: Self-pay | Admitting: Internal Medicine

## 2021-10-08 DIAGNOSIS — Z1231 Encounter for screening mammogram for malignant neoplasm of breast: Secondary | ICD-10-CM | POA: Diagnosis not present

## 2021-10-08 LAB — HM MAMMOGRAPHY

## 2021-10-21 ENCOUNTER — Telehealth: Payer: Self-pay | Admitting: Pharmacist

## 2021-10-21 NOTE — Progress Notes (Addendum)
    Chronic Care Management Pharmacy Assistant   Name: Valerie [name]  MRN: [id number] DOB: 09/02/1946  Patient assistance application for Synjardy/Trulicity has been completed on behalf of the patient.  Left message so patient is aware they will need to provide requested income verification to be sent the manufacturer.   [place] Pharmacist Assistant [phone]

## 2021-10-28 DIAGNOSIS — M722 Plantar fascial fibromatosis: Secondary | ICD-10-CM | POA: Diagnosis not present

## 2021-10-28 DIAGNOSIS — E1351 Other specified diabetes mellitus with diabetic peripheral angiopathy without gangrene: Secondary | ICD-10-CM | POA: Diagnosis not present

## 2021-10-28 DIAGNOSIS — I739 Peripheral vascular disease, unspecified: Secondary | ICD-10-CM | POA: Diagnosis not present

## 2021-10-28 DIAGNOSIS — M792 Neuralgia and neuritis, unspecified: Secondary | ICD-10-CM | POA: Diagnosis not present

## 2021-10-28 DIAGNOSIS — M2042 Other hammer toe(s) (acquired), left foot: Secondary | ICD-10-CM | POA: Diagnosis not present

## 2021-10-28 DIAGNOSIS — M21962 Unspecified acquired deformity of left lower leg: Secondary | ICD-10-CM | POA: Diagnosis not present

## 2021-10-28 DIAGNOSIS — M19072 Primary osteoarthritis, left ankle and foot: Secondary | ICD-10-CM | POA: Diagnosis not present

## 2021-10-28 DIAGNOSIS — M19071 Primary osteoarthritis, right ankle and foot: Secondary | ICD-10-CM | POA: Diagnosis not present

## 2021-10-28 DIAGNOSIS — M2041 Other hammer toe(s) (acquired), right foot: Secondary | ICD-10-CM | POA: Diagnosis not present

## 2021-11-03 DIAGNOSIS — G4733 Obstructive sleep apnea (adult) (pediatric): Secondary | ICD-10-CM | POA: Diagnosis not present

## 2021-11-03 DIAGNOSIS — G479 Sleep disorder, unspecified: Secondary | ICD-10-CM | POA: Diagnosis not present

## 2021-11-04 ENCOUNTER — Other Ambulatory Visit: Payer: Self-pay | Admitting: Internal Medicine

## 2021-11-10 ENCOUNTER — Other Ambulatory Visit: Payer: Self-pay | Admitting: Internal Medicine

## 2021-11-10 DIAGNOSIS — E876 Hypokalemia: Secondary | ICD-10-CM

## 2021-11-10 DIAGNOSIS — I10 Essential (primary) hypertension: Secondary | ICD-10-CM

## 2021-11-10 DIAGNOSIS — E559 Vitamin D deficiency, unspecified: Secondary | ICD-10-CM

## 2021-11-11 ENCOUNTER — Other Ambulatory Visit: Payer: Self-pay | Admitting: Internal Medicine

## 2021-11-11 DIAGNOSIS — I10 Essential (primary) hypertension: Secondary | ICD-10-CM

## 2021-11-14 ENCOUNTER — Ambulatory Visit: Payer: HMO | Admitting: Cardiovascular Disease

## 2021-11-14 ENCOUNTER — Other Ambulatory Visit: Payer: Self-pay

## 2021-11-14 ENCOUNTER — Encounter: Payer: Self-pay | Admitting: Cardiovascular Disease

## 2021-11-14 VITALS — BP 120/72 | HR 71 | Ht 66.0 in | Wt 233.4 lb

## 2021-11-14 DIAGNOSIS — G4733 Obstructive sleep apnea (adult) (pediatric): Secondary | ICD-10-CM | POA: Diagnosis not present

## 2021-11-14 DIAGNOSIS — R0789 Other chest pain: Secondary | ICD-10-CM | POA: Diagnosis not present

## 2021-11-14 DIAGNOSIS — I10 Essential (primary) hypertension: Secondary | ICD-10-CM

## 2021-11-14 DIAGNOSIS — E785 Hyperlipidemia, unspecified: Secondary | ICD-10-CM | POA: Diagnosis not present

## 2021-11-14 DIAGNOSIS — R002 Palpitations: Secondary | ICD-10-CM | POA: Diagnosis not present

## 2021-11-14 NOTE — Assessment & Plan Note (Signed)
History of hyperlipidemia on statin therapy with lipid profile performed 03/2621 revealing total cholesterol 115

## 2021-11-14 NOTE — Progress Notes (Signed)
11/14/2021 Valerie [name]   [birth year]  [id number]  Primary Physician Janith Lima, MD Primary Cardiologist: Lorretta Harp MD [name], [place]  HPI:  Valerie [name] is a 75 y.o.  morbidly overweight married African-American female mother 49, grandmother of 5 grandchildren who was in the textile industry and was referred by [name] for cardiovascular evaluation because of palpitations.  I last  saw her in the office 10/29/2020.  She has been evaluated by Drs. Hochrein and Oval Linsey in the past.  She saw Dr. Oval Linsey a little over 3 years ago which time she had an echo that was essentially normal and a 2-day protocol Myoview that was normal as well.  She does have a history of treated hypertension, diabetes and hyperlipidemia.  She wears BiPAP for obstructive sleep apnea and his family history for heart disease with both father and brother who died of myocardial infarctions.  He is never had a heart attack or stroke.  She does get chronic chest pain every several days and is chronically short of breath.  She also has reactive airways disease.  He started having palpitations several months ago and these occur several times a week lasting seconds to minutes at a time.   She has had a Myoview stress test and 2D echo all of which were normal.  She also had a monitor placed because of palpitations 10/03/2018 that showed sinus rhythm with occasional PVCs and PACs.    Since I saw her a year ago she has remained stable.  She still gets occasional palpitations and atypical chest pain.   Current Meds  Medication Sig   albuterol (PROVENTIL) (2.5 MG/3ML) 0.083% nebulizer solution    albuterol (VENTOLIN HFA) 108 (90 Base) MCG/ACT inhaler Inhale 2 puffs into the lungs every 6 (six) hours as needed. For shortness of breath   aspirin 81 MG tablet Take 81 mg by mouth daily.    atorvastatin (LIPITOR) 20 MG tablet Take 1 tablet (20 mg total) by mouth daily.   budesonide-formoterol  (SYMBICORT) 80-4.5 MCG/ACT inhaler Inhale 2 puffs into the lungs as needed.    carvedilol (COREG) 25 MG tablet TAKE 1 TABLET (25 MG TOTAL) BY MOUTH 2 (TWO) TIMES DAILY WITH A MEAL.   chlorthalidone (HYGROTON) 25 MG tablet TAKE 1 TABLET (25 MG TOTAL) BY MOUTH DAILY.   clobetasol ointment (TEMOVATE) 0.76 % Apply 1 application topically 2 (two) times daily.   Dulaglutide (TRULICITY) 1.5 AU/6.3FH SOPN Inject 0.5 mLs (1.5 mg total) into the skin once a week.   Empagliflozin-metFORMIN HCl (SYNJARDY) 12.5-500 MG TABS Take 1 tablet by mouth 2 (two) times daily.   famotidine (PEPCID) 40 MG tablet TAKE 1 TABLET BY MOUTH EVERY DAY   ferrous sulfate 325 (65 FE) MG tablet TAKE 1 TABLET BY MOUTH TWICE DAILY WITH A MEAL   Finerenone (KERENDIA) 20 MG TABS Take 1 tablet by mouth daily.   gabapentin (NEURONTIN) 300 MG capsule TAKE 1 CAPSULE BY MOUTH THREE TIMES A DAY   glucose blood (FREESTYLE TEST STRIPS) test strip USE TO TEST BLOOD SUGAR 3 TIMES A DAY. DX: E11.8   Insulin Pen Needle 31G X 5 MM MISC Use once daily with insulin   irbesartan (AVAPRO) 300 MG tablet TAKE 0.5 TABLETS BY MOUTH DAILY.   latanoprost (XALATAN) 0.005 % ophthalmic solution Place 1 drop into both eyes at bedtime.    nystatin ointment (MYCOSTATIN) APPLY ON THE SKIN THREE TIMES DAILY   oxyCODONE-acetaminophen (PERCOCET/ROXICET) 5-325 MG  tablet Take 1 tablet by mouth every 8 (eight) hours as needed for severe pain.   pantoprazole (PROTONIX) 40 MG tablet TAKE 1 TABLET BY MOUTH 2 TIMES DAILY BEFORE A MEAL   potassium chloride SA (KLOR-CON M20) 20 MEQ tablet TAKE 1 TABLET BY MOUTH TWICE A DAY   VITAMIN D3 SUPER STRENGTH 50 MCG (2000 UT) tablet TAKE 1 TABLET BY MOUTH EVERY DAY     Allergies  Allergen Reactions   Food Swelling    bananas   Penicillins Swelling and Rash    Social History   Socioeconomic History   Marital status: Widowed    Spouse name: Not on file   Number of children: 3   Years of education: 11th   Highest education  level: 11th grade  Occupational History   Occupation: disabled  Tobacco Use   Smoking status: Never   Smokeless tobacco: Never  Vaping Use   Vaping Use: Never used  Substance and Sexual Activity   Alcohol use: No    Alcohol/week: 0.0 standard drinks   Drug use: No   Sexual activity: Not Currently    Comment: not working, lives with husband, 3 sons  Other Topics Concern   Not on file  Social History Narrative   Client's spouse recently passed Feb 2021    also caregiver for son who is 13 yo and is disabled.   Social Determinants of Health   Financial Resource Strain: Low Risk    Difficulty of Paying Living Expenses: Not hard at all  Food Insecurity: No Food Insecurity   Worried About Charity fundraiser in the Last Year: Never true   Arboriculturist in the Last Year: Never true  Transportation Needs: Not on file  Physical Activity: Inactive   Days of Exercise per Week: 0 days   Minutes of Exercise per Session: 0 min  Stress: Stress Concern Present   Feeling of Stress : Rather much  Social Connections: Moderately Integrated   Frequency of Communication with Friends and Family: More than three times a week   Frequency of Social Gatherings with Friends and Family: Once a week   Attends Religious Services: 1 to 4 times per year   Active Member of [employer] or Organizations: No   Attends Archivist Meetings: 1 to 4 times per year   Marital Status: Widowed  Intimate Partner Violence: Not on file     Review of Systems: General: negative for chills, fever, night sweats or weight changes.  Cardiovascular: negative for chest pain, dyspnea on exertion, edema, orthopnea, palpitations, paroxysmal nocturnal dyspnea or shortness of breath Dermatological: negative for rash Respiratory: negative for cough or wheezing Urologic: negative for hematuria Abdominal: negative for nausea, vomiting, diarrhea, bright red blood per rectum, melena, or hematemesis Neurologic: negative for  visual changes, syncope, or dizziness All other systems reviewed and are otherwise negative except as noted above.    Blood pressure 120/72, pulse 71, height 5\' 6"  (1.676 m), weight 233 lb 6.4 oz (105.9 kg), SpO2 98 %.  General appearance: alert and no distress Neck: no adenopathy, no carotid bruit, no JVD, supple, symmetrical, trachea midline, and thyroid not enlarged, symmetric, no tenderness/mass/nodules Lungs: clear to auscultation bilaterally Heart: regular rate and rhythm, S1, S2 normal, no murmur, click, rub or gallop Extremities: extremities normal, atraumatic, no cyanosis or edema Pulses: 2+ and symmetric Skin: Skin color, texture, turgor normal. No rashes or lesions Neurologic: Grossly normal  EKG sinus rhythm at 77 with nonspecific ST and  T wave changes.  I personally reviewed this EKG.  ASSESSMENT AND PLAN:   Hyperlipidemia with target LDL less than 100 History of hyperlipidemia on statin therapy with lipid profile performed 03/2621 revealing total cholesterol 115  Essential hypertension, benign History of essential hypertension blood pressure measured today 120/72.  She is on carvedilol and chlorthalidone as well as Avapro.  Obstructive sleep apnea treated with BiPAP History of obstructive sleep apnea on BiPAP  Palpitations History of palpitations with event monitoring showing occasional PACs, PVCs and short runs of SVT.  She still occasionally gets palpitations.  She is on a beta-blocker.  Atypical chest pain History of atypical chest pain with Myoview performed 10/06/2018 that was low risk and nonischemic.  I am going to get a coronary calcium score to further evaluate.     Lorretta Harp MD FACP,FACC,FAHA, [facility] 11/14/2021 10:49 AM

## 2021-11-14 NOTE — Patient Instructions (Signed)
Medication Instructions:  Your physician recommends that you continue on your current medications as directed. Please refer to the Current Medication list given to you today.  *If you need a refill on your cardiac medications before your next appointment, please call your pharmacy*  Testing/Procedures: Dr. Berry has ordered a CT coronary calcium score. This test is done at [address]. This is $99 out of pocket.   Coronary CalciumScan A coronary calcium scan is an imaging test used to look for deposits of calcium and other fatty materials (plaques) in the inner lining of the blood vessels of the heart (coronary arteries). These deposits of calcium and plaques can partly clog and narrow the coronary arteries without producing any symptoms or warning signs. This puts a person at risk for a heart attack. This test can detect these deposits before symptoms develop. Tell a health care provider about:  Any allergies you have.  All medicines you are taking, including vitamins, herbs, eye drops, creams, and over-the-counter medicines.  Any problems you or family members have had with anesthetic medicines.  Any blood disorders you have.  Any surgeries you have had.  Any medical conditions you have.  Whether you are pregnant or may be pregnant. What are the risks? Generally, this is a safe procedure. However, problems may occur, including:  Harm to a pregnant woman and her unborn baby. This test involves the use of radiation. Radiation exposure can be dangerous to a pregnant woman and her unborn baby. If you are pregnant, you generally should not have this procedure done.  Slight increase in the risk of cancer. This is because of the radiation involved in the test. What happens before the procedure? No preparation is needed for this procedure. What happens during the procedure?  You will undress and remove any jewelry around your neck or chest.  You will put on a  hospital gown.  Sticky electrodes will be placed on your chest. The electrodes will be connected to an electrocardiogram (ECG) machine to record a tracing of the electrical activity of your heart.  A CT scanner will take pictures of your heart. During this time, you will be asked to lie still and hold your breath for 2-3 seconds while a picture of your heart is being taken. The procedure may vary among health care providers and hospitals. What happens after the procedure?  You can get dressed.  You can return to your normal activities.  It is up to you to get the results of your test. Ask your health care provider, or the department that is doing the test, when your results will be ready. Summary  A coronary calcium scan is an imaging test used to look for deposits of calcium and other fatty materials (plaques) in the inner lining of the blood vessels of the heart (coronary arteries).  Generally, this is a safe procedure. Tell your health care provider if you are pregnant or may be pregnant.  No preparation is needed for this procedure.  A CT scanner will take pictures of your heart.  You can return to your normal activities after the scan is done. This information is not intended to replace advice given to you by your health care provider. Make sure you discuss any questions you have with your health care provider. Document Released: 05/28/2008 Document Revised: 10/19/2016 Document Reviewed: 10/19/2016 Elsevier Interactive Patient Education  2017 Elsevier Inc.     Follow-Up: At [facility], you and your health needs are   our priority.  As part of our continuing mission to provide you with exceptional heart care, we have created designated Provider Care Teams.  These Care Teams include your primary Cardiologist (physician) and Advanced Practice Providers (APPs -  Physician Assistants and Nurse Practitioners) who all work together to provide you with the care you need, when you need  it.  We recommend signing up for the patient portal called "MyChart".  Sign up information is provided on this After Visit Summary.  MyChart is used to connect with patients for Virtual Visits (Telemedicine).  Patients are able to view lab/test results, encounter notes, upcoming appointments, etc.  Non-urgent messages can be sent to your provider as well.   To learn more about what you can do with MyChart, go to [email or web address].    Your next appointment:   12 month(s)  The format for your next appointment:   In Person  Provider:   Jonathan Berry, MD 

## 2021-11-14 NOTE — Assessment & Plan Note (Signed)
History of obstructive sleep apnea on BiPAP 

## 2021-11-14 NOTE — Assessment & Plan Note (Signed)
History of essential hypertension blood pressure measured today 120/72.  She is on carvedilol and chlorthalidone as well as Avapro.

## 2021-11-14 NOTE — Assessment & Plan Note (Signed)
History of palpitations with event monitoring showing occasional PACs, PVCs and short runs of SVT.  She still occasionally gets palpitations.  She is on a beta-blocker.

## 2021-11-14 NOTE — Assessment & Plan Note (Signed)
History of atypical chest pain with Myoview performed 10/06/2018 that was low risk and nonischemic.  I am going to get a coronary calcium score to further evaluate.

## 2021-11-19 ENCOUNTER — Other Ambulatory Visit: Payer: Self-pay | Admitting: Internal Medicine

## 2021-11-19 DIAGNOSIS — E118 Type 2 diabetes mellitus with unspecified complications: Secondary | ICD-10-CM

## 2021-11-19 DIAGNOSIS — I10 Essential (primary) hypertension: Secondary | ICD-10-CM

## 2021-11-21 ENCOUNTER — Telehealth: Payer: HMO

## 2021-12-01 ENCOUNTER — Telehealth: Payer: HMO

## 2021-12-15 DIAGNOSIS — Z961 Presence of intraocular lens: Secondary | ICD-10-CM | POA: Diagnosis not present

## 2021-12-15 DIAGNOSIS — H401132 Primary open-angle glaucoma, bilateral, moderate stage: Secondary | ICD-10-CM | POA: Diagnosis not present

## 2021-12-29 DIAGNOSIS — M79672 Pain in left foot: Secondary | ICD-10-CM | POA: Diagnosis not present

## 2021-12-29 DIAGNOSIS — M79671 Pain in right foot: Secondary | ICD-10-CM | POA: Diagnosis not present

## 2021-12-29 DIAGNOSIS — M722 Plantar fascial fibromatosis: Secondary | ICD-10-CM | POA: Diagnosis not present

## 2021-12-29 DIAGNOSIS — E1351 Other specified diabetes mellitus with diabetic peripheral angiopathy without gangrene: Secondary | ICD-10-CM | POA: Diagnosis not present

## 2021-12-30 DIAGNOSIS — I739 Peripheral vascular disease, unspecified: Secondary | ICD-10-CM | POA: Diagnosis not present

## 2022-01-01 ENCOUNTER — Other Ambulatory Visit: Payer: Self-pay

## 2022-01-03 ENCOUNTER — Other Ambulatory Visit: Payer: Self-pay | Admitting: Internal Medicine

## 2022-01-03 DIAGNOSIS — D509 Iron deficiency anemia, unspecified: Secondary | ICD-10-CM

## 2022-01-08 ENCOUNTER — Other Ambulatory Visit: Payer: Self-pay | Admitting: Internal Medicine

## 2022-01-08 ENCOUNTER — Ambulatory Visit (INDEPENDENT_AMBULATORY_CARE_PROVIDER_SITE_OTHER): Payer: HMO

## 2022-01-08 DIAGNOSIS — E118 Type 2 diabetes mellitus with unspecified complications: Secondary | ICD-10-CM

## 2022-01-08 DIAGNOSIS — I10 Essential (primary) hypertension: Secondary | ICD-10-CM

## 2022-01-08 DIAGNOSIS — N1831 Chronic kidney disease, stage 3a: Secondary | ICD-10-CM

## 2022-01-08 DIAGNOSIS — E785 Hyperlipidemia, unspecified: Secondary | ICD-10-CM

## 2022-01-08 DIAGNOSIS — J4520 Mild intermittent asthma, uncomplicated: Secondary | ICD-10-CM

## 2022-01-08 MED ORDER — BUDESONIDE-FORMOTEROL FUMARATE 80-4.5 MCG/ACT IN AERO: 2.0000 | INHALATION_SPRAY | Freq: Two times a day (BID) | RESPIRATORY_TRACT | 1 refills | Status: DC | PRN

## 2022-01-08 NOTE — Progress Notes (Signed)
Chronic Care Management Pharmacy Note  01/08/2022 Name:  Valerie [name] MRN:  [id number] DOB:  [birth year]  Summary: -Patient reports that she is no longer taking kerendia due to cost (>$2000) -Notes that BP has been averaging 120-130/70-80's - denies issues with hypotension -BG well controlled averaging 120-150 - denies any issues with low blood sugars - last A1c in range  -Last LDL remains well controlled and in range  -Notes that she has been having some swelling in her ankles recently, as well as continues to have recurrences of dizzy feelings (scheduled for PCP f/u in 4 weeks)  Recommendations/Changes made from today's visit: -BP and BG well controlled - do not suspect either of these the cause of dizziness as she denies hypotension / hypoglycemia  -Recommended for patient to reduce sodium intake, use of compression socks to help reduce swelling  -Kerendia PAP application completed - patient to come to office at earliest convenience to sign patient portion -Refill of symbicort requested [facility] [place]   -Offered to schedule earlier office visit to discuss dizziness / swelling - patient declined as current appointment is earlier appointment with Dr. Ronnald Ramp, did not wish to see a different provider   Subjective: Valerie [name] is an 77 y.o. year old female who is a primary patient of Valerie Lima, MD.  The CCM team was consulted for assistance with disease management and care coordination needs.    Engaged with patient by telephone for follow up visit in response to provider referral for pharmacy case management and/or care coordination services.   Consent to Services:  The patient was given information about Chronic Care Management services, agreed to services, and gave verbal consent prior to initiation of services.  Please see initial visit note for detailed documentation.   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 Her husband passed in 2021 and it has been very difficult for her and her disabled son she cares for.  Recent office visits: 09/25/2021 - Dr. Ronnald Ramp - recent fall - kerendia increased to 11m daily   Recent consult visits: 11/14/2021 - Dr. BGwenlyn Found- Cardiology - remains stable, gets occasional chest pains / palpitations - no changes to medications   Hospital visits: None in previous 6 months   Objective:  Lab Results  Component Value Date   CREATININE 1.01 09/25/2021   BUN 15 09/25/2021   GFR 55.21 (L) 09/25/2021   GFRNONAA >60 08/29/2015   GFRAA >60 08/29/2015   NA 138 09/25/2021   K 3.5 09/25/2021   CALCIUM 9.7 09/25/2021   CO2 31 09/25/2021   GLUCOSE 115 (H) 09/25/2021    Lab Results  Component Value Date/Time   HGBA1C 6.7 (H) 08/06/2021 02:11 PM   HGBA1C 6.9 (H) 04/10/2021 02:13 PM   GFR 55.21 (L) 09/25/2021 02:17 PM   GFR 48.29 (L) 08/06/2021 02:11 PM   MICROALBUR <0.7 09/25/2021 02:17 PM   MICROALBUR 0.5 07/01/2020 11:43 AM    Last diabetic Eye exam:  Lab Results  Component Value Date/Time   HMDIABEYEEXA No Retinopathy 03/11/2021 12:00 AM    Last diabetic Foot exam:  Lab  Results  Component Value Date/Time   HMDIABFOOTEX done 06/20/2018 12:00 AM     Lab Results  Component Value Date   CHOL 115 04/10/2021   HDL 40.40 04/10/2021   LDLCALC 43 03/28/2014   LDLDIRECT 42.0 04/10/2021   TRIG 232.0 (H) 04/10/2021   CHOLHDL 3 04/10/2021    Hepatic Function Latest Ref Rng & Units 04/10/2021 02/21/2020 09/13/2019  Total Protein 6.0 - 8.3 g/dL 7.7 7.7 7.9  Albumin 3.5 - 5.2 g/dL 4.2 4.2 4.5  AST 0 - 37 U/L '17 18 16  ' ALT 0 - 35 U/L '20  18 18  ' Alk Phosphatase 39 - 117 U/L 109 95 90  Total Bilirubin 0.2 - 1.2 mg/dL 0.4 0.6 0.5  Bilirubin, Direct 0.0 - 0.3 mg/dL 0.1 0.0 0.1    Lab Results  Component Value Date/Time   TSH 1.64 02/06/2021 01:53 PM   TSH 3.79 07/01/2020 11:43 AM    CBC Latest Ref Rng & Units 08/06/2021 04/10/2021 02/10/2021  WBC 4.0 - 10.5 K/uL 10.6(H) 9.2 10.2  Hemoglobin 12.0 - 15.0 g/dL 11.2(L) 10.8(L) 11.0(L)  Hematocrit 36.0 - 46.0 % 33.7(L) 32.6(L) 33.0(L)  Platelets 150.0 - 400.0 K/uL 388.0 368.0 332.0   Iron/TIBC/Ferritin/ %Sat    Component Value Date/Time   IRON 73 04/10/2021 1413   TIBC 356 07/01/2020 1146   FERRITIN 68.2 04/10/2021 1413   IRONPCTSAT 23 07/01/2020 1146     Lab Results  Component Value Date/Time   VD25OH 36 07/01/2020 11:43 AM   VD25OH 66.29 02/21/2020 02:22 PM   VD25OH 82.13 09/13/2019 04:58 PM    Clinical ASCVD: No  The ASCVD Risk score (Arnett DK, et al., 2019) failed to calculate for the following reasons:   The patient has a prior MI or stroke diagnosis    Depression screen [facility] 2/9 02/27/2021 05/27/2020 03/18/2020  Decreased Interest '1 1 1  ' Down, Depressed, Hopeless '3 1 2  ' PHQ - 2 Score '4 2 3  ' Altered sleeping '3 2 2  ' Tired, decreased energy '3 2 2  ' Change in appetite '3 2 2  ' Feeling bad or failure about yourself  0 0 3  Trouble concentrating '3 3 3  ' Moving slowly or fidgety/restless 0 1 0  Suicidal thoughts 0 0 0  PHQ-9 Score '16 12 15  ' Difficult doing work/chores Somewhat difficult Very difficult Very difficult  Some recent data might be hidden      Social History   Tobacco Use  Smoking Status Never  Smokeless Tobacco Never   BP Readings from Last 3 Encounters:  11/14/21 120/72  09/25/21 138/78  08/14/21 128/77   Pulse Readings from Last 3 Encounters:  11/14/21 71  09/25/21 87  08/14/21 76   Wt Readings from Last 3 Encounters:  11/14/21 233 lb 6.4 oz (105.9 kg)  09/25/21 235 lb (106.6 kg)  08/14/21 231 lb (104.8 kg)   BMI Readings from Last  3 Encounters:  11/14/21 37.67 kg/m  09/25/21 37.93 kg/m  08/14/21 37.28 kg/m    Assessment/Interventions: Review of patient past medical history, allergies, medications, health status, including review of consultants reports, laboratory and other test data, was performed as part of comprehensive evaluation and provision of chronic care management services.   SDOH:  (Social Determinants of Health) assessments and interventions performed: Yes  SDOH Screenings   Alcohol Screen: Low Risk    Last Alcohol Screening Score (AUDIT): 0  Depression (PHQ2-9): Medium Risk   PHQ-2 Score: 16  Financial Resource Strain: Low Risk    Difficulty  of Paying Living Expenses: Not hard at all  Food Insecurity: No Food Insecurity   Worried About Charity fundraiser in the Last Year: Never true   Ran Out of Food in the Last Year: Never true  Housing: Low Risk    Last Housing Risk Score: 0  Physical Activity: Inactive   Days of Exercise per Week: 0 days   Minutes of Exercise per Session: 0 min  Social Connections: Moderately Integrated   Frequency of Communication with Friends and Family: More than three times a week   Frequency of Social Gatherings with Friends and Family: Once a week   Attends Religious Services: 1 to 4 times per year   Active Member of [employer] or Organizations: No   Attends Archivist Meetings: 1 to 4 times per year   Marital Status: Widowed  Stress: Stress Concern Present   Feeling of Stress : Rather much  Tobacco Use: Low Risk    Smoking Tobacco Use: Never   Smokeless Tobacco Use: Never   Passive Exposure: Not on file  Transportation Needs: Not on file    CCM Care Plan  Allergies  Allergen Reactions   Food Swelling    bananas   Penicillins Swelling and Rash    Medications Reviewed Today     Reviewed by Tomasa Blase, [facility] (Pharmacist) on 01/08/22 at 1424  Med List Status: <None>   Medication Order Taking? Sig Documenting Provider Last Dose Status  Informant  albuterol (PROVENTIL) (2.5 MG/3ML) 0.083% nebulizer solution [id number] No   Patient not taking: Reported on 01/08/2022   [provider] Not Taking Active            Med Note (LAWSON, [place]AV[place]   Wed Apr 30, 2021  1:44 PM)    albuterol (VENTOLIN HFA) 108 (90 Base) MCG/ACT inhaler [id number] Yes Inhale 2 puffs into the lungs every 6 (six) hours as needed. For shortness of breath Valerie Lima, MD Taking Active            Med Note ([place]   Tue Jul 02, 2020 10:27 AM)    aspirin 81 MG tablet [id number] Yes Take 81 mg by mouth daily.  [provider] Taking Active Self  atorvastatin (LIPITOR) 20 MG tablet [id number] Yes Take 1 tablet (20 mg total) by mouth daily. Valerie Lima, MD Taking Active   budesonide-formoterol [facility]) 80-4.5 MCG/ACT inhaler [id number] Yes Inhale 2 puffs into the lungs 2 (two) times daily as needed. [provider] Taking Active            Med Note Alvin Critchley   Wed Apr 30, 2021  1:44 PM)    carvedilol (COREG) 25 MG tablet [id number] Yes TAKE 1 TABLET (25 MG TOTAL) BY MOUTH 2 (TWO) TIMES DAILY WITH A MEAL. Valerie Lima, MD Taking Active   chlorthalidone (HYGROTON) 25 MG tablet [id number] Yes TAKE 1 TABLET (25 MG TOTAL) BY MOUTH DAILY. Valerie Lima, MD Taking Active   clobetasol ointment (TEMOVATE) 0.05 % [id number] Yes Apply 1 application topically 2 (two) times daily. Valerie Lima, MD Taking Active   Dulaglutide (TRULICITY) 1.5 IO/2.7OJ [place] [facility] [id number] Yes Inject 0.5 mLs (1.5 mg total) into the skin once a week. Valerie Lima, MD Taking Active            Med Note Elyse Hsu Apr 30, 2021  1:44 PM)    Empagliflozin-metFORMIN HCl [facility]) 12.5-500 MG TABS [id number] Yes Take 1 tablet  by mouth 2 (two) times daily. Valerie Lima, MD Taking Active            Med Note Elyse Hsu Apr 30, 2021  1:44 PM)    famotidine (PEPCID) 40 MG tablet [id number] Yes TAKE 1 TABLET BY MOUTH EVERY DAY Valerie Lima, MD Taking Active   ferrous sulfate 325 (65 FE) MG tablet [id number] Yes TAKE 1 TABLET BY MOUTH TWICE DAILY WITH A MEAL Valerie Lima, MD Taking Active   Finerenone [facility]) 20 MG TABS [id number] No Take 1 tablet by mouth daily.  Patient not taking: Reported on 01/08/2022   Valerie Lima, MD Not Taking Active   gabapentin (NEURONTIN) 300 MG capsule [id number] Yes TAKE 1 CAPSULE BY MOUTH THREE TIMES A DAY Valerie Lima, MD Taking Active   glucose blood (FREESTYLE TEST STRIPS) test strip [id number] Yes USE TO TEST BLOOD SUGAR 3 TIMES A DAY. DX: E11.8 Valerie Lima, MD Taking Active   Insulin Pen Needle 31G X 5 MM MISC [id number] Yes Use once daily with insulin Valerie Lima, MD Taking Active   irbesartan (AVAPRO) 300 MG tablet [id number] Yes TAKE 0.5 TABLETS BY MOUTH DAILY. Valerie Lima, MD Taking Active   latanoprost (XALATAN) 0.005 % ophthalmic solution [id number] Yes Place 1 drop into both eyes at bedtime.  [provider] Taking Active Self  nystatin ointment (MYCOSTATIN) [id number] Yes APPLY ON THE SKIN THREE TIMES DAILY [provider] Taking Active   oxyCODONE-acetaminophen (PERCOCET/ROXICET) 5-325 MG tablet [id number] Yes Take 1 tablet by mouth every 8 (eight) hours as needed for severe pain. 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 08/06/2021   Tinea corporis 10/01/2020   Degenerative arthritis of right knee  07/02/2020   Moderate episode of recurrent major depressive disorder ([place]) 07/01/2020   Esophageal dysphagia 05/27/2020   Gastroesophageal reflux disease with esophagitis without hemorrhage 05/27/2020   Diuretic-induced hypokalemia 03/27/2020   OAB (overactive bladder) 03/27/2020   Insomnia secondary to chronic pain 02/21/2020   Primary osteoarthritis involving multiple joints 10/10/2019   Thiamine deficiency, unspecified 01/09/2019   Eczema 08/18/2018   Palpitations 08/18/2018   Vitamin D deficiency disease 08/11/2018   Long-term current use of opiate analgesic 06/21/2018   External bleeding hemorrhoids 03/24/2018   Obstructive sleep apnea treated with BiPAP 11/03/2017   Atypical chest pain 08/29/2015   Osteopenia 03/28/2014   Insomnia 03/28/2014   Routine general medical examination at a health care facility 07/17/2013   Morbid obesity with BMI of 45.0-49.9, adult ([place]) 04/14/2013   Visit for screening mammogram 01/20/2013   Spinal stenosis of lumbar 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 10/05/2012   Hypothyroidism 02/20/2011   Fatty liver disease, nonalcoholic 00/34/9179   Mild intermittent asthma 10/25/2009   Type II diabetes mellitus with manifestations ([place]) 05/06/2009   Hyperlipidemia with  target LDL less than 100 05/06/2009   B12 deficiency anemia 05/06/2009   Depression with anxiety 05/06/2009   Essential hypertension, benign 05/06/2009   GERD 05/06/2009    Immunization History  Administered Date(s) Administered   Fluad Quad(high Dose 65+) 09/13/2019, 09/25/2021   Influenza Split 09/21/2011, 08/19/2012   Influenza Whole 09/25/2009, 09/04/2010   Influenza, High Dose Seasonal PF 09/23/2016, 10/06/2017, 08/18/2018   Influenza,inj,Quad PF,6+ Mos 09/01/2013, 11/29/2014, 08/30/2015, 08/13/2020   PFIZER(Purple Top)SARS-COV-2 Vaccination 02/11/2020, 03/06/2020, 09/19/2020   Pneumococcal Conjugate-13 07/13/2014   Pneumococcal Polysaccharide-23 01/20/2013, 04/18/2018   Td 02/05/2010    Tdap 06/02/2017   Zoster Recombinat (Shingrix) 08/18/2018, 01/04/2019    Conditions to be addressed/monitored:  Hypertension, Hyperlipidemia, Diabetes, Asthma, and Pain  Care Plan : [place]  Updates made by Tomasa Blase, RPH since 01/08/2022 12:00 AM     Problem: Hypertension, Hyperlipidemia, Diabetes, Asthma, and Pain   Priority: High     Long-Range Goal: Disease management   Start Date: 05/30/2021  Expected End Date: 05/30/2022  This Visit's Progress: On track  Recent Progress: On track  Priority: High  Note:   Current Barriers:  Unable to independently monitor therapeutic efficacy  Pharmacist Clinical Goal(s):  Patient will achieve adherence to monitoring guidelines and medication adherence to achieve therapeutic efficacy through collaboration with PharmD and provider.   Interventions: 1:1 collaboration with Valerie Lima, MD regarding development and update of comprehensive plan of care as evidenced by provider attestation and co-signature Inter-disciplinary care team collaboration (see longitudinal plan of care) Comprehensive medication review performed; medication list updated in electronic medical record  Hypertension  Controlled  BP goal is:  <130/80 Patient checks BP at home daily Patient home BP readings are ranging: 129/72, 122/78, 141/72, 121/69, 126/73, 128/72, 116/75 Patient has failed these meds in the past: n/a Patient is currently controlled on the following medications: Irbesartan 300 mg - 1/2 tablet daily (pt takes when BP > 160/100, occurs once or twice a month) Chlorthalidone 25 mg daily Carvedilol 25 mg BID    We discussed: Pt reports BP is at goal at home; she endorses compliance and denies issues   Plan: Recommend to continue current medication   Hyperlipidemia  Controlled  LDL goal < 100 Significant family history of MI, no personal hx of MI or stroke Last LDL: 42 Patient has failed these meds in past: n/a Patient is  currently controlled on the following medications: Atorvastatin 20 mg daily HS Aspirin 81 mg daily   We discussed:  LDL is at goal; pt endorses compliance and denies issues   Plan: Continue current medications   Diabetes  Controlled A1c goal <7% Lab Results  Component Value Date   HGBA1C 6.7 (H) 08/06/2021  Checking BG: Daily Recent FBG Readings: 120-150   Patient has failed these meds in past: Invokana, Farxiga, Janumet, pioglitazone, nateglinide Patient is currently controlled on the following medications: Trulicity 1.5 mg weekly Friday (via PAP) Synjardy 12.5-500 mg BID (via PAP)   We discussed: A1c is at goal; pt endorses compliance and denies issues   Plan: Continue current medications and control with diet and exercise    CKD Stage 3a  Patient is currently controlled on the following medications:  Kerendia 20 mg daily - not currently taking due to copay being >$2000  We discussed:  CKD course and progression; emphasized importance of BP and DM control as well as hyrdration; advised to avoid NSAIDs; pt has f/u appt with PCP next month for kidney labs  and Kerendia dose escalation  Plan: Continue current medications  Patient Goals/Self-Care Activities Patient will:  - take medications as prescribed -focus on medication adherence by pill box -check glucose daily -check blood pressure daily     Care Gaps: COVID booster    Patient's preferred pharmacy is:  [id number] [place] [id number]: [phone]: [phone] [place] [address] [place] [address]Phone: [phone]: [phone]  Uses pill box? Yes Pt endorses 100% compliance  Care Plan and Follow Up Patient Decision:  Patient agrees to Care Plan and Follow-up.  Plan: Telephone follow up appointment with care management team member scheduled  for:  3 months  DTomasa Blase PharmD Clinical Pharmacist, [place]

## 2022-01-08 NOTE — Patient Instructions (Addendum)
Visit Information  Following are the goals we discussed today:   Manage My Medicine   Timeframe:  Long-Range Goal Priority:  Medium Start Date:   05/30/21                          Expected End Date:    12/13/2022                   Follow Up Date: 04/08/2022   - call for medicine refill 2 or 3 days before it runs out - call if I am sick and can't take my medicine - keep a list of all the medicines I take; vitamins and herbals too - use a pillbox to sort medicine  - make clinic aware if medication is not affordable to discuss options    Why is this important?   These steps will help you keep on track with your medicines.  Plan: Telephone follow up appointment with care management team member scheduled for:  3 months  The patient has been provided with contact information for the care management team and has been advised to call with any health related questions or concerns.   Tomasa Blase, PharmD Clinical Pharmacist, [facility]   Please call the care guide team at [phone] if you need to cancel or reschedule your appointment.   Print copy of patient instructions, educational materials, and care plan provided in person. (Patient to pick up in office when signing [place] Patient Assistance forms

## 2022-01-13 DIAGNOSIS — N1831 Chronic kidney disease, stage 3a: Secondary | ICD-10-CM

## 2022-01-13 DIAGNOSIS — I129 Hypertensive chronic kidney disease with stage 1 through stage 4 chronic kidney disease, or unspecified chronic kidney disease: Secondary | ICD-10-CM | POA: Diagnosis not present

## 2022-01-13 DIAGNOSIS — E1122 Type 2 diabetes mellitus with diabetic chronic kidney disease: Secondary | ICD-10-CM | POA: Diagnosis not present

## 2022-01-13 DIAGNOSIS — E785 Hyperlipidemia, unspecified: Secondary | ICD-10-CM | POA: Diagnosis not present

## 2022-01-24 ENCOUNTER — Other Ambulatory Visit: Payer: Self-pay | Admitting: Internal Medicine

## 2022-01-24 DIAGNOSIS — K2100 Gastro-esophageal reflux disease with esophagitis, without bleeding: Secondary | ICD-10-CM

## 2022-01-26 ENCOUNTER — Ambulatory Visit: Payer: HMO | Admitting: Internal Medicine

## 2022-01-29 ENCOUNTER — Other Ambulatory Visit: Payer: Self-pay

## 2022-02-02 ENCOUNTER — Encounter: Payer: Self-pay | Admitting: Internal Medicine

## 2022-02-02 ENCOUNTER — Ambulatory Visit (INDEPENDENT_AMBULATORY_CARE_PROVIDER_SITE_OTHER): Payer: HMO | Admitting: Internal Medicine

## 2022-02-02 ENCOUNTER — Other Ambulatory Visit: Payer: Self-pay

## 2022-02-02 ENCOUNTER — Telehealth: Payer: Self-pay

## 2022-02-02 VITALS — BP 138/82 | HR 78 | Temp 97.9°F | Ht 66.0 in | Wt 224.0 lb

## 2022-02-02 DIAGNOSIS — D510 Vitamin B12 deficiency anemia due to intrinsic factor deficiency: Secondary | ICD-10-CM

## 2022-02-02 DIAGNOSIS — I10 Essential (primary) hypertension: Secondary | ICD-10-CM

## 2022-02-02 DIAGNOSIS — K529 Noninfective gastroenteritis and colitis, unspecified: Secondary | ICD-10-CM | POA: Insufficient documentation

## 2022-02-02 DIAGNOSIS — E039 Hypothyroidism, unspecified: Secondary | ICD-10-CM

## 2022-02-02 DIAGNOSIS — G479 Sleep disorder, unspecified: Secondary | ICD-10-CM | POA: Diagnosis not present

## 2022-02-02 DIAGNOSIS — E118 Type 2 diabetes mellitus with unspecified complications: Secondary | ICD-10-CM | POA: Diagnosis not present

## 2022-02-02 DIAGNOSIS — G4733 Obstructive sleep apnea (adult) (pediatric): Secondary | ICD-10-CM | POA: Diagnosis not present

## 2022-02-02 LAB — HEMOGLOBIN A1C: Hgb A1c MFr Bld: 6.7 % — ABNORMAL HIGH (ref 4.6–6.5)

## 2022-02-02 LAB — CBC WITH DIFFERENTIAL/PLATELET
Basophils Absolute: 0 10*3/uL (ref 0.0–0.1)
Basophils Relative: 0.4 % (ref 0.0–3.0)
Eosinophils Absolute: 0.3 10*3/uL (ref 0.0–0.7)
Eosinophils Relative: 3.3 % (ref 0.0–5.0)
HCT: 35 % — ABNORMAL LOW (ref 36.0–46.0)
Hemoglobin: 11.7 g/dL — ABNORMAL LOW (ref 12.0–15.0)
Lymphocytes Relative: 28.6 % (ref 12.0–46.0)
Lymphs Abs: 2.6 10*3/uL (ref 0.7–4.0)
MCHC: 33.4 g/dL (ref 30.0–36.0)
MCV: 88.6 fl (ref 78.0–100.0)
Monocytes Absolute: 0.9 10*3/uL (ref 0.1–1.0)
Monocytes Relative: 9.9 % (ref 3.0–12.0)
Neutro Abs: 5.2 10*3/uL (ref 1.4–7.7)
Neutrophils Relative %: 57.8 % (ref 43.0–77.0)
Platelets: 350 10*3/uL (ref 150.0–400.0)
RBC: 3.95 Mil/uL (ref 3.87–5.11)
RDW: 16.2 % — ABNORMAL HIGH (ref 11.5–15.5)
WBC: 8.9 10*3/uL (ref 4.0–10.5)

## 2022-02-02 LAB — FOLATE: Folate: 12.7 ng/mL (ref >5.9)

## 2022-02-02 LAB — TSH: TSH: 2.17 u[IU]/mL (ref 0.35–5.50)

## 2022-02-02 MED ORDER — PROMETHAZINE HCL 12.5 MG PO TABS: 12.5000 mg | ORAL_TABLET | Freq: Four times a day (QID) | ORAL | 0 refills | Status: DC | PRN

## 2022-02-02 NOTE — Telephone Encounter (Signed)
Left message on phone, patient asked to speak with me about a medication question  Patient to call back to further discuss

## 2022-02-02 NOTE — Patient Instructions (Signed)
Viral Gastroenteritis, Adult Viral gastroenteritis is also known as the stomach flu. This condition may affect your stomach, small intestine, and large intestine. It can cause sudden watery diarrhea, fever, and vomiting. This condition is caused by many different viruses. These viruses can be passed from person to person very easily (are contagious). Diarrhea and vomiting can make you feel weak and cause you to become dehydrated. You may not be able to keep fluids down. Dehydration can make you tired and thirsty, cause you to have a dry mouth, and decrease how often you urinate. It is important to replace the fluids that you lose from diarrhea and vomiting. What are the causes? Gastroenteritis is caused by many viruses, including rotavirus and norovirus. Norovirus is the most common cause in adults. You can get sick after being exposed to the viruses from other people. You can also get sick by: Eating food, drinking water, or touching a surface contaminated with one of these viruses. Sharing utensils or other personal items with an infected person. What increases the risk? You are more likely to develop this condition if you: Have a weak body defense system (immune system). Live with one or more children who are younger than 68 years old. Live in a nursing home. Travel on cruise ships. What are the signs or symptoms? Symptoms of this condition start suddenly 1-3 days after exposure to a virus. Symptoms may last for a few days or for as long as a week. Common symptoms include watery diarrhea and vomiting. Other symptoms include: Fever. Headache. Fatigue. Pain in the abdomen. Chills. Weakness. Nausea. Muscle aches. Loss of appetite. How is this diagnosed? This condition is diagnosed with a medical history and physical exam. You may also have a stool test to check for viruses or other infections. How is this treated? This condition typically goes away on its own. The focus of treatment is to  prevent dehydration and restore lost fluids (rehydration). This condition may be treated with: An oral rehydration solution (ORS) to replace important salts and minerals (electrolytes) in your body. Take this if told by your health care provider. This is a drink that is sold at pharmacies and retail stores. Medicines to help with your symptoms. Probiotic supplements to reduce symptoms of diarrhea. Fluids given through an IV, if dehydration is severe. Older adults and people with other diseases or a weak immune system are at higher risk for dehydration. Follow these instructions at home: Eating and drinking  Take an ORS as told by your health care provider. Drink clear fluids in small amounts as you are able. Clear fluids include: Water. Ice chips. Diluted fruit juice. Low-calorie sports drinks. Drink enough fluid to keep your urine pale yellow. Eat small amounts of healthy foods every 3-4 hours as you are able. This may include whole grains, fruits, vegetables, lean meats, and yogurt. Avoid fluids that contain a lot of sugar or caffeine, such as energy drinks, sports drinks, and soda. Avoid spicy or fatty foods. Avoid alcohol. General instructions Wash your hands often, especially after having diarrhea or vomiting. If soap and water are not available, use hand sanitizer. Make sure that all people in your household wash their hands well and often. Take over-the-counter and prescription medicines only as told by your health care provider. Rest at home while you recover. Watch your condition for any changes. Take a warm bath to relieve any burning or pain from frequent diarrhea episodes. Keep all follow-up visits as told by your health care provider. This  is important. Contact a health care provider if you: Cannot keep fluids down. Have symptoms that get worse. Have new symptoms. Feel light-headed or dizzy. Have muscle cramps. Get help right away if you: Have chest pain. Feel  extremely weak or you faint. See blood in your vomit. Have vomit that looks like coffee grounds. Have bloody or black stools or stools that look like tar. Have a severe headache, a stiff neck, or both. Have a rash. Have severe pain, cramping, or bloating in your abdomen. Have trouble breathing or you are breathing very quickly. Have a fast heartbeat. Have skin that feels cold and clammy. Feel confused. Have pain when you urinate. Have signs of dehydration, such as: Dark urine, very little urine, or no urine. Cracked lips. Dry mouth. Sunken eyes. Sleepiness. Weakness. Summary Viral gastroenteritis is also known as the stomach flu. It can cause sudden watery diarrhea, fever, and vomiting. This condition can be passed from person to person very easily (is contagious). Take an ORS if told by your health care provider. This is a drink that is sold at pharmacies and retail stores. Wash your hands often, especially after having diarrhea or vomiting. If soap and water are not available, use hand sanitizer. This information is not intended to replace advice given to you by your health care provider. Make sure you discuss any questions you have with your health care provider. Document Revised: 05/19/2019 Document Reviewed: 10/05/2018 Elsevier Patient Education  2022 [employer].

## 2022-02-02 NOTE — Progress Notes (Signed)
Subjective:  Patient ID: Valerie [name], female    DOB: 24-Oct-1946  Age: 76 y.o. MRN: [id number]  CC: Anemia, Hypertension, Diabetes, and Hypothyroidism  This visit occurred during the SARS-CoV-2 public health emergency.  Safety protocols were in place, including screening questions prior to the visit, additional usage of staff PPE, and extensive cleaning of exam room while observing appropriate contact time as indicated for disinfecting solutions.    HPI EMELDA KOHLBECK presents for f/up -  She complains of a 3-day history of nausea, vomiting, and watery diarrhea.  Her symptoms are improving.  She denies fever, chills, cough, shortness of breath, abdominal pain, melena, or bright red blood per rectum.  Outpatient Medications Prior to Visit  Medication Sig Dispense Refill   albuterol (PROVENTIL) (2.5 MG/3ML) 0.083% nebulizer solution      albuterol (VENTOLIN HFA) 108 (90 Base) MCG/ACT inhaler Inhale 2 puffs into the lungs every 6 (six) hours as needed. For shortness of breath 3 Inhaler 4   aspirin 81 MG tablet Take 81 mg by mouth daily.      atorvastatin (LIPITOR) 20 MG tablet Take 1 tablet (20 mg total) by mouth daily. 90 tablet 1   budesonide-formoterol (SYMBICORT) 80-4.5 MCG/ACT inhaler Inhale 2 puffs into the lungs 2 (two) times daily as needed. 3 each 1   carvedilol (COREG) 25 MG tablet TAKE 1 TABLET (25 MG TOTAL) BY MOUTH 2 (TWO) TIMES DAILY WITH A MEAL. 180 tablet 0   chlorthalidone (HYGROTON) 25 MG tablet TAKE 1 TABLET (25 MG TOTAL) BY MOUTH DAILY. 90 tablet 0   clobetasol ointment (TEMOVATE) 2.69 % Apply 1 application topically 2 (two) times daily. 60 g 1   Dulaglutide (TRULICITY) 1.5 SW/5.4OE SOPN Inject 0.5 mLs (1.5 mg total) into the skin once a week. 4 pen 0   Empagliflozin-metFORMIN HCl (SYNJARDY) 12.5-500 MG TABS Take 1 tablet by mouth 2 (two) times daily. 180 tablet 3   famotidine (PEPCID) 40 MG tablet TAKE 1 TABLET BY MOUTH EVERY DAY 90 tablet 1   ferrous sulfate 325 (65  FE) MG tablet TAKE 1 TABLET BY MOUTH TWICE DAILY WITH A MEAL 180 tablet 0   gabapentin (NEURONTIN) 300 MG capsule TAKE 1 CAPSULE BY MOUTH THREE TIMES A DAY 270 capsule 0   glucose blood (FREESTYLE TEST STRIPS) test strip USE TO TEST BLOOD SUGAR 3 TIMES A DAY. DX: E11.8 300 strip 1   Insulin Pen Needle 31G X 5 MM MISC Use once daily with insulin 100 each 3   irbesartan (AVAPRO) 300 MG tablet TAKE 0.5 TABLETS BY MOUTH DAILY. 45 tablet 1   latanoprost (XALATAN) 0.005 % ophthalmic solution Place 1 drop into both eyes at bedtime.      nystatin ointment (MYCOSTATIN) APPLY ON THE SKIN THREE TIMES DAILY     oxyCODONE-acetaminophen (PERCOCET/ROXICET) 5-325 MG tablet Take 1 tablet by mouth every 8 (eight) hours as needed for severe pain. 90 tablet 0   potassium chloride SA (KLOR-CON M20) 20 MEQ tablet TAKE 1 TABLET BY MOUTH TWICE A DAY 180 tablet 1   VITAMIN D3 SUPER STRENGTH 50 MCG (2000 UT) tablet TAKE 1 TABLET BY MOUTH EVERY DAY 90 tablet 1   pantoprazole (PROTONIX) 40 MG tablet TAKE 1 TABLET BY MOUTH 2 TIMES DAILY BEFORE A MEAL 180 tablet 0   No facility-administered medications prior to visit.    ROS Review of Systems  Constitutional:  Negative for appetite change, chills, diaphoresis, fatigue and fever.  HENT: Negative.    Eyes:  Negative.   Respiratory:  Negative for cough, chest tightness, shortness of breath and wheezing.   Cardiovascular:  Negative for chest pain, palpitations and leg swelling.  Gastrointestinal:  Positive for diarrhea, nausea and vomiting. Negative for abdominal pain and constipation.  Endocrine: Negative.   Genitourinary: Negative.  Negative for difficulty urinating and dysuria.  Musculoskeletal: Negative.  Negative for myalgias.  Skin:  Negative for color change and rash.  Neurological:  Negative for dizziness, weakness, light-headedness and numbness.  Hematological:  Negative for adenopathy. Does not bruise/bleed easily.  Psychiatric/Behavioral: Negative.      Objective:  BP 138/82 (BP Location: Right Arm, Patient Position: Sitting, Cuff Size: Large)    Pulse 78    Temp 97.9 F (36.6 C) (Oral)    Ht 5\' 6"  (1.676 m)    Wt 224 lb (101.6 kg)    SpO2 98%    BMI 36.15 kg/m   BP Readings from Last 3 Encounters:  02/02/22 138/82  11/14/21 120/72  09/25/21 138/78    Wt Readings from Last 3 Encounters:  02/02/22 224 lb (101.6 kg)  11/14/21 233 lb 6.4 oz (105.9 kg)  09/25/21 235 lb (106.6 kg)    Physical Exam Vitals reviewed.  Constitutional:      General: She is not in acute distress.    Appearance: She is not ill-appearing, toxic-appearing or diaphoretic.  HENT:     Nose: Nose normal.     Mouth/Throat:     Mouth: Mucous membranes are moist.  Eyes:     General: No scleral icterus.    Conjunctiva/sclera: Conjunctivae normal.  Cardiovascular:     Rate and Rhythm: Normal rate and regular rhythm.     Heart sounds: No murmur heard. Pulmonary:     Effort: Pulmonary effort is normal.     Breath sounds: No stridor. No wheezing, rhonchi or rales.  Abdominal:     General: Abdomen is flat.     Palpations: There is no mass.     Tenderness: There is no abdominal tenderness. There is no guarding.     Hernia: No hernia is present.  Musculoskeletal:     Cervical back: Neck supple.  Lymphadenopathy:     Cervical: No cervical adenopathy.  Skin:    General: Skin is warm and dry.     Findings: No rash.  Neurological:     General: No focal deficit present.     Mental Status: She is alert.  Psychiatric:        Mood and Affect: Mood normal.        Behavior: Behavior normal.    Lab Results  Component Value Date   WBC 8.9 02/02/2022   HGB 11.7 (L) 02/02/2022   HCT 35.0 (L) 02/02/2022   PLT 350.0 02/02/2022   GLUCOSE 115 (H) 09/25/2021   CHOL 115 04/10/2021   TRIG 232.0 (H) 04/10/2021   HDL 40.40 04/10/2021   LDLDIRECT 42.0 04/10/2021   LDLCALC 43 03/28/2014   ALT 20 04/10/2021   AST 17 04/10/2021   NA 138 09/25/2021   K 3.5  09/25/2021   CL 97 09/25/2021   CREATININE 1.01 09/25/2021   BUN 15 09/25/2021   CO2 31 09/25/2021   TSH 2.17 02/02/2022   INR 1.1 (H) 04/10/2021   HGBA1C 6.7 (H) 02/02/2022   MICROALBUR <0.7 09/25/2021    MR Brain Wo Contrast  Result Date: 08/10/2021 CLINICAL DATA:  76 year old female with dizziness, weakness, unbalance for a few weeks. No known injury. EXAM: MRI HEAD WITHOUT CONTRAST  TECHNIQUE: Multiplanar, multiecho pulse sequences of the brain and surrounding structures were obtained without intravenous contrast. COMPARISON:  Brain MRI 02/27/2013, and earlier. FINDINGS: Brain: No restricted diffusion to suggest acute infarction. No midline shift, mass effect, evidence of mass lesion, ventriculomegaly, extra-axial collection or acute intracranial hemorrhage. Cervicomedullary junction and pituitary are within normal limits. Cerebral volume is not significantly changed since 2014, normal for age. And scattered small foci of nonspecific cerebral white matter T2 and FLAIR hyperintensity remain mild for age, minimally progressed. No cortical encephalomalacia or chronic cerebral blood products identified. Deep gray matter nuclei, brainstem and cerebellum appear normal. Vascular: Major intracranial vascular flow voids are stable. Skull and upper cervical spine: Negative for age visible cervical spine. Stable bone marrow signal since 2014. Sinuses/Orbits: Interval postoperative changes to the right globe. Otherwise negative orbits. Paranasal Visualized paranasal sinuses and mastoids are stable and well aerated. Other: Grossly normal visible internal auditory structures. Normal stylomastoid foramina. Small midline nasopharyngeal retention cyst (Tornwaldt cyst, normal variant). Negative visible scalp and face soft tissues. IMPRESSION: 1. No acute intracranial abnormality. 2. Noncontrast MRI appearance of the brain is largely stable since 2014 and unremarkable for age. Electronically Signed   By: Genevie Ann M.D.    On: 08/10/2021 11:21    Assessment & Plan:   Emelin was seen today for anemia, hypertension, diabetes and hypothyroidism.  Diagnoses and all orders for this visit:  Essential hypertension, benign- Her blood pressure is adequately well controlled. -     TSH; Future -     TSH  Acquired hypothyroidism- Her TSH is in the normal range.  She will stay on the current dose of levothyroxine. -     TSH; Future -     TSH  Type II diabetes mellitus with manifestations ([place])- Her blood sugar is adequately well controlled. -     Hemoglobin A1c; Future -     Hemoglobin A1c  Vitamin B12 deficiency anemia due to intrinsic factor deficiency- B12 and folate are normal now. -     CBC with Differential/Platelet; Future -     Folate; Future -     Folate -     CBC with Differential/Platelet  Gastroenteritis -     promethazine (PHENERGAN) 12.5 MG tablet; Take 1 tablet (12.5 mg total) by mouth every 6 (six) hours as needed for nausea or vomiting.   I am having Shermeka A. Richeson start on promethazine. I am also having her maintain her latanoprost, aspirin, albuterol, albuterol, Insulin Pen Needle, nystatin ointment, Trulicity, Synjardy, clobetasol ointment, FREESTYLE TEST STRIPS, gabapentin, irbesartan, oxyCODONE-acetaminophen, atorvastatin, Vitamin D3 Super Strength, potassium chloride SA, carvedilol, chlorthalidone, ferrous sulfate, budesonide-formoterol, and famotidine.  Meds ordered this encounter  Medications   promethazine (PHENERGAN) 12.5 MG tablet    Sig: Take 1 tablet (12.5 mg total) by mouth every 6 (six) hours as needed for nausea or vomiting.    Dispense:  30 tablet    Refill:  0     Follow-up: Return in about 3 months (around 05/02/2022).  [name], MD

## 2022-02-03 ENCOUNTER — Other Ambulatory Visit: Payer: Self-pay | Admitting: Internal Medicine

## 2022-02-03 NOTE — Telephone Encounter (Signed)
Pt is requesting a call back. Before I could request the CB number pt hung up.

## 2022-02-03 NOTE — Telephone Encounter (Signed)
Spoke with patient,   Asking for updates on Symbicort, Synjardy, and Trulicity   Has been approved for PAP programs  [place] PAP -have not received update as of this time   Tomasa Blase, PharmD Clinical Pharmacist, [place]

## 2022-02-09 DIAGNOSIS — M79671 Pain in right foot: Secondary | ICD-10-CM | POA: Diagnosis not present

## 2022-02-09 DIAGNOSIS — E1351 Other specified diabetes mellitus with diabetic peripheral angiopathy without gangrene: Secondary | ICD-10-CM | POA: Diagnosis not present

## 2022-02-09 DIAGNOSIS — M79672 Pain in left foot: Secondary | ICD-10-CM | POA: Diagnosis not present

## 2022-02-09 DIAGNOSIS — M722 Plantar fascial fibromatosis: Secondary | ICD-10-CM | POA: Diagnosis not present

## 2022-02-12 ENCOUNTER — Telehealth: Payer: Self-pay | Admitting: Internal Medicine

## 2022-02-12 ENCOUNTER — Ambulatory Visit (INDEPENDENT_AMBULATORY_CARE_PROVIDER_SITE_OTHER)
Admission: RE | Admit: 2022-02-12 | Discharge: 2022-02-12 | Disposition: A | Discharge status: Home / Self Care | Payer: Self-pay | Source: Ambulatory Visit | Attending: Cardiovascular Disease | Admitting: Cardiovascular Disease

## 2022-02-12 ENCOUNTER — Other Ambulatory Visit: Payer: Self-pay

## 2022-02-12 DIAGNOSIS — E785 Hyperlipidemia, unspecified: Secondary | ICD-10-CM

## 2022-02-12 NOTE — Telephone Encounter (Signed)
Patient to come in to office to sign [employer] PAP forms ? ?Will submit to [employer] PAP once received  ?

## 2022-02-12 NOTE — Telephone Encounter (Signed)
N/A unable to leave a message for patient to call back to schedule Medicare Annual Wellness Visit  ? ?Last AWV  02/27/21 ? ?Please schedule at anytime with [facility] if patient calls the office back.   ? ?40 Minutes appointment  ? ?Any questions, please call me at [phone]  ?

## 2022-02-13 ENCOUNTER — Telehealth: Payer: Self-pay

## 2022-02-14 ENCOUNTER — Other Ambulatory Visit: Payer: Self-pay | Admitting: Internal Medicine

## 2022-02-14 DIAGNOSIS — I10 Essential (primary) hypertension: Secondary | ICD-10-CM

## 2022-02-19 ENCOUNTER — Other Ambulatory Visit: Payer: Self-pay | Admitting: Internal Medicine

## 2022-02-19 DIAGNOSIS — I10 Essential (primary) hypertension: Secondary | ICD-10-CM

## 2022-02-19 DIAGNOSIS — E118 Type 2 diabetes mellitus with unspecified complications: Secondary | ICD-10-CM

## 2022-02-19 NOTE — Progress Notes (Signed)
    Chronic Care Management Pharmacy Assistant   Name: SHILONDA CAMERO  MRN: [id number] DOB: [birth year]  Contacted  [employer]   to follow up on patient assistance application for [place]. Per representative at  [employer] application is processing.  Representative stated that they are missing patient traditional medicare card ID number. She stated that they mail patient a letter stating this on 02/17/22. She stated that patient can call them on the phone and give Card number.   Made a call to the patient to get Medicare card information. Patient gave me the information, call [employer] and gave card number. Representative said to call back tomorrow for determination of approval or denial of patient application.  [place] Pharmacist Assistant [phone]

## 2022-03-03 NOTE — Progress Notes (Signed)
Contacted  [employer]   to follow up on patient assistance application for [place]. Per representative at  [employer]  states patient has been approved starting 02/19/22 through 12/13/22.   Spoke to patient  to call [place] and set up a delivery date for shipment.   [place] Pharmacist Assistant [phone]

## 2022-03-06 ENCOUNTER — Other Ambulatory Visit: Payer: Self-pay

## 2022-03-06 ENCOUNTER — Encounter: Payer: Self-pay | Admitting: Nurse Practitioner

## 2022-03-06 ENCOUNTER — Ambulatory Visit (INDEPENDENT_AMBULATORY_CARE_PROVIDER_SITE_OTHER): Payer: HMO | Admitting: Nurse Practitioner

## 2022-03-06 VITALS — BP 140/82 | HR 80 | Temp 98.3°F | Ht 66.0 in | Wt 230.2 lb

## 2022-03-06 DIAGNOSIS — I679 Cerebrovascular disease, unspecified: Secondary | ICD-10-CM

## 2022-03-06 DIAGNOSIS — R42 Dizziness and giddiness: Secondary | ICD-10-CM

## 2022-03-06 DIAGNOSIS — R270 Ataxia, unspecified: Secondary | ICD-10-CM

## 2022-03-06 DIAGNOSIS — I10 Essential (primary) hypertension: Secondary | ICD-10-CM

## 2022-03-06 MED ORDER — CARVEDILOL 25 MG PO TABS: 12.5000 mg | ORAL_TABLET | Freq: Two times a day (BID) | ORAL | 0 refills | Status: DC

## 2022-03-06 NOTE — Progress Notes (Signed)
Subjective:  Patient ID: Valerie [name], female    DOB: [birth year]  Age: 76 y.o. MRN: [id number]  CC:  Chief Complaint  Patient presents with   Follow-up    Light headed, dizziness, bp drop, been going on and off for a while      HPI  This patient arrives today for the above.  She reports that she is been having intermittent dizziness and lightheadedness for a while.  She tells me yesterday was last episode of this.  She was walking to her mailbox when she started to feel dizzy and lightheaded and needed to stop and take a rest.  She then went back inside and checked her blood glucose which she reports was " okay" and started checking her blood pressure frequently throughout the rest the day.  She tells me it was fluctuating between 90-140 / 53-80.  She denies any loss of consciousness.  She also tells me she has had intermittent chest pain which has been evaluated by cardiology.  I do see where she underwent CT cardiac scoring earlier this month (which showed score of 499 making her the 92nd percentile, and then CT over read by radiology found evidence of atherosclerosis of the thoracic aorta and small hiatal hernia without other significant abnormalities noted.).  She also continues on carvedilol, chlorthalidone, irbesartan for blood pressure control and she is on aspirin and atorvastatin for hyperlipidemia.  As far she knows she is never had a stroke, but she has been diagnosed with ataxia.  She has had MRI of the brain completed approximately 7 months ago.  This did not show anything acute.   Past Medical History:  Diagnosis Date   Anemia    Anxiety and depression    Arthritis    Asthma    Cataract    Degenerative arthritis    Depression    Diabetes mellitus, type 2 (HCC)    Fatigue    GERD (gastroesophageal reflux disease)    Glaucoma    Heart palpitations    Hemorrhoids    Hyperlipidemia    Hypertension    Hypothyroidism    Hypoxemia 11/24/2013   Memory deficit  10/04/2013   Obesity    Sleep apnea    BiPAP   Snoring disorder       Family History  Problem Relation Age of Onset   Alcohol abuse Mother    Heart attack Mother    Coronary artery disease Brother    Heart attack Brother    Hypertension Brother    Hyperlipidemia Brother    Heart attack Father    Hypertension Father    Hyperlipidemia Father    Heart disease Sister    Atrial fibrillation Sister    Hypertension Sister    Hyperlipidemia Sister    Hypertension Other        family history   Alcohol abuse Other    Colon cancer Brother 4   Hypertension Brother    Hyperlipidemia Brother    Dementia Brother    Hypertension Brother    Hyperlipidemia Brother    Diabetes Son    Hypertension Son    Hyperlipidemia Son    Diabetes Son    Hypertension Son    Hyperlipidemia Son    Cerebral palsy Son    Hypertension Son    Hyperlipidemia Son     Social History   Social History Narrative   Client's spouse 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   Social History   Tobacco Use   Smoking status: Never   Smokeless tobacco: Never  Substance Use Topics   Alcohol use: No    Alcohol/week: 0.0 standard drinks     Current Meds  Medication Sig   albuterol (PROVENTIL) (2.5 MG/3ML) 0.083% nebulizer solution    albuterol (VENTOLIN HFA) 108 (90 Base) MCG/ACT inhaler Inhale 2 puffs into the lungs every 6 (six) hours as needed. For shortness of breath   aspirin 81 MG tablet Take 81 mg by mouth daily.    atorvastatin (LIPITOR) 20 MG tablet Take 1 tablet (20 mg total) by mouth daily.   budesonide-formoterol (SYMBICORT) 80-4.5 MCG/ACT inhaler Inhale 2 puffs into the lungs 2 (two) times daily as needed.   chlorthalidone (HYGROTON) 25 MG tablet TAKE 1 TABLET (25 MG TOTAL) BY MOUTH DAILY.   clobetasol ointment (TEMOVATE) [id number] % Apply 1 application topically 2 (two) times daily.   Dulaglutide (TRULICITY) 1.5 0000000 SOPN Inject 0.5 mLs (1.5 mg  total) into the skin once a week.   Empagliflozin-metFORMIN HCl (SYNJARDY) 12.5-500 MG TABS Take 1 tablet by mouth 2 (two) times daily.   famotidine (PEPCID) 40 MG tablet TAKE 1 TABLET BY MOUTH EVERY DAY   ferrous sulfate 325 (65 FE) MG tablet TAKE 1 TABLET BY MOUTH TWICE DAILY WITH A MEAL   gabapentin (NEURONTIN) 300 MG capsule TAKE 1 CAPSULE BY MOUTH THREE TIMES A DAY   glucose blood (FREESTYLE TEST STRIPS) test strip USE TO TEST BLOOD SUGAR 3 TIMES A DAY. DX: E11.8   Insulin Pen Needle 31G X 5 MM MISC Use once daily with insulin   irbesartan (AVAPRO) 300 MG tablet TAKE 0.5 TABLETS BY MOUTH DAILY.   latanoprost (XALATAN) 0.005 % ophthalmic solution Place 1 drop into both eyes at bedtime.    nystatin ointment (MYCOSTATIN) APPLY ON THE SKIN THREE TIMES DAILY   oxyCODONE-acetaminophen (PERCOCET/ROXICET) 5-325 MG tablet Take 1 tablet by mouth every 8 (eight) hours as needed for severe pain.   pantoprazole (PROTONIX) 40 MG tablet TAKE 1 TABLET BY MOUTH 2 TIMES DAILY BEFORE A MEAL   potassium chloride SA (KLOR-CON M20) 20 MEQ tablet TAKE 1 TABLET BY MOUTH TWICE A DAY   promethazine (PHENERGAN) 12.5 MG tablet Take 1 tablet (12.5 mg total) by mouth every 6 (six) hours as needed for nausea or vomiting.   VITAMIN D3 SUPER STRENGTH 50 MCG (2000 UT) tablet TAKE 1 TABLET BY MOUTH EVERY DAY   [DISCONTINUED] carvedilol (COREG) 25 MG tablet TAKE 1 TABLET (25 MG TOTAL) BY MOUTH TWICE A DAY WITH MEALS    ROS:  Review of Systems  Constitutional:  Negative for chills and fever.  Respiratory:  Negative for shortness of breath.   Cardiovascular:  Positive for chest pain (intermittent).  Gastrointestinal:  Negative for abdominal pain and diarrhea.  Genitourinary:  Negative for dysuria, frequency, hematuria and urgency.  Neurological:  Positive for dizziness.    Objective:   Today's Vitals: BP 140/82   Pulse 80   Temp 98.3 F (36.8 C) (Oral)   Ht '5\' 6"'$  (1.676 m)   Wt 230 lb 3.2 oz (104.4 kg)   SpO2 98%    BMI 37.16 kg/m     03/06/2022    4:41 PM 02/02/2022    1:10 PM 11/14/2021   10:17 AM  Vitals with BMI  Height '5\' 6"'$  '5\' 6"'$  '5\' 6"'$   Weight 230 lbs 3 oz 224 lbs 233 lbs 6 oz  BMI 37.17 0000000 [id number]  Systolic [id number] 0000000 123456  Diastolic 82 82 72  Pulse 80 78 71     Physical Exam Vitals reviewed.  Constitutional:      General: She is not in acute distress.    Appearance: Normal appearance.  HENT:     Head: Normocephalic and atraumatic.  Neck:     Vascular: No carotid bruit.  Cardiovascular:     Rate and Rhythm: Normal rate and regular rhythm.     Pulses: Normal pulses.     Heart sounds: Normal heart sounds.  Pulmonary:     Effort: Pulmonary effort is normal.     Breath sounds: Normal breath sounds.  Skin:    General: Skin is warm and dry.  Neurological:     General: No focal deficit present.     Mental Status: She is alert and oriented to person, place, and time.     Cranial Nerves: No cranial nerve deficit.     Motor: Motor function is intact.     Coordination: Coordination is intact.     Gait: Gait is intact.  Psychiatric:        Mood and Affect: Mood normal.        Behavior: Behavior normal.        Judgment: Judgment normal.      EKG: Normal sinus rhythm, comparable to most recent EKG.  Heart rate 78. Orthostatic vital signs: Sitting 127/78, standing 128/77   Assessment and Plan   1. 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 Essential hypertension, benign      Plan: 1.-3.  Etiology unclear, vital signs stable, EKG stable, negative orthostatics, no signs of infection.  I will reach out to her cardiologist regarding her concerns to see if they feel additional work-up for coronary artery disease is needed at this time.  We will trial reduction in dose of Coreg to 12.5 mg twice a day for 1 week.  She will follow-up next week for close monitoring.  We will also refer to neurology for further evaluation of her ataxia, neurologically she seems intact  today.  I do question whether or not this may also be BPPV versus polypharmacy.  Unfortunately lab is closed by the time the office visit was completed so she will come back next week for some blood work as well to evaluate thyroid function, look for anemia, and check for metabolic abnormalities. Further recommendations may be made based on these results.     Tests ordered Orders Placed This Encounter  Procedures   TSH   CBC   T3, free   T4, free   Comp Met (CMET)   Ambulatory referral to Neurology   EKG 12-Lead      Meds ordered this encounter  Medications   carvedilol (COREG) 25 MG tablet    Sig: Take 0.5 tablets (12.5 mg total) by mouth 2 (two) times daily with a meal.    Dispense:  180 tablet    Refill:  0    Order Specific Question:   Supervising Provider    Answer:   Binnie Rail [id number]    Patient to follow-up in 1 week, or sooner as needed.  Ailene Ards, NP

## 2022-03-09 ENCOUNTER — Other Ambulatory Visit (INDEPENDENT_AMBULATORY_CARE_PROVIDER_SITE_OTHER): Payer: HMO

## 2022-03-09 DIAGNOSIS — R270 Ataxia, unspecified: Secondary | ICD-10-CM

## 2022-03-09 DIAGNOSIS — I679 Cerebrovascular disease, unspecified: Secondary | ICD-10-CM

## 2022-03-09 DIAGNOSIS — R42 Dizziness and giddiness: Secondary | ICD-10-CM | POA: Diagnosis not present

## 2022-03-09 DIAGNOSIS — I10 Essential (primary) hypertension: Secondary | ICD-10-CM | POA: Diagnosis not present

## 2022-03-09 LAB — CBC
HCT: 33.9 % — ABNORMAL LOW (ref 36.0–46.0)
Hemoglobin: 11.1 g/dL — ABNORMAL LOW (ref 12.0–15.0)
MCHC: 32.7 g/dL (ref 30.0–36.0)
MCV: 90.2 fl (ref 78.0–100.0)
Platelets: 354 10*3/uL (ref 150.0–400.0)
RBC: 3.76 Mil/uL — ABNORMAL LOW (ref 3.87–5.11)
RDW: 16.7 % — ABNORMAL HIGH (ref 11.5–15.5)
WBC: 9.3 10*3/uL (ref 4.0–10.5)

## 2022-03-09 LAB — COMPREHENSIVE METABOLIC PANEL
ALT: 15 U/L (ref 0–35)
AST: 14 U/L (ref 0–37)
Albumin: 4.3 g/dL (ref 3.5–5.2)
Alkaline Phosphatase: 100 U/L (ref 39–117)
BUN: 13 mg/dL (ref 6–23)
CO2: 31 mEq/L (ref 19–32)
Calcium: 9.8 mg/dL (ref 8.4–10.5)
Chloride: 97 mEq/L (ref 96–112)
Creatinine, Ser: 0.92 mg/dL (ref 0.40–1.20)
GFR: 61.55 mL/min (ref >60.00)
Glucose, Bld: 112 mg/dL — ABNORMAL HIGH (ref 70–99)
Potassium: 3.4 mEq/L — ABNORMAL LOW (ref 3.5–5.1)
Sodium: 138 mEq/L (ref 135–145)
Total Bilirubin: 0.5 mg/dL (ref 0.2–1.2)
Total Protein: 7.5 g/dL (ref 6.0–8.3)

## 2022-03-10 LAB — TSH: TSH: 2.61 u[IU]/mL (ref 0.35–5.50)

## 2022-03-10 LAB — T4, FREE: Free T4: 0.65 ng/dL (ref 0.60–1.60)

## 2022-03-10 LAB — T3, FREE: T3, Free: 3 pg/mL (ref 2.3–4.2)

## 2022-03-12 ENCOUNTER — Encounter: Payer: Self-pay | Admitting: Nurse Practitioner

## 2022-03-12 ENCOUNTER — Ambulatory Visit (INDEPENDENT_AMBULATORY_CARE_PROVIDER_SITE_OTHER): Payer: HMO | Admitting: Nurse Practitioner

## 2022-03-12 VITALS — BP 140/82 | HR 79 | Temp 98.3°F | Ht 66.0 in | Wt 230.0 lb

## 2022-03-12 DIAGNOSIS — R42 Dizziness and giddiness: Secondary | ICD-10-CM

## 2022-03-12 DIAGNOSIS — I10 Essential (primary) hypertension: Secondary | ICD-10-CM

## 2022-03-12 MED ORDER — CARVEDILOL 25 MG PO TABS: 25.0000 mg | ORAL_TABLET | Freq: Two times a day (BID) | ORAL | 0 refills | Status: DC

## 2022-03-12 NOTE — Patient Instructions (Addendum)
Cardiology: Dr. Gwenlyn Found phone number: [phone]; Having dizziness multiple times a day, had Calcium score done previously, per chart review Dr. Gwenlyn Found recommended patient have f/u CTA. I recommended she see Dr. Gwenlyn Found for this as I may not be able to interpret results as in depth as Dr. Gwenlyn Found. ? ?Neurology: Ask the neurologist about your dizziness (ataxia) and memory loss.  ?

## 2022-03-12 NOTE — Progress Notes (Signed)
Subjective:  Patient ID: Valerie [name], female    DOB: 04/29/1946  Age: 76 y.o. MRN: [id number]  CC:  Chief Complaint  Patient presents with   Dizziness      HPI  This patient arrives today for the above.  Dizziness has been going on for "a while".  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  She has a history of hypertension and is on Coreg, chlorthalidone, irbesartan.  She also has a history of diabetes and continues on [place].  She follows with cardiology routinely and has had a CT cardiac score earlier this month which did show a moderate risk.  Per chart review cardiologist recommended patient undergo CTA.  She also reports that she has a history of atypical chest pain etiology still unknown.  She tells me she has had multiple stress tests, as well as cardiac monitoring, and multiple EKGs.  At last office visit EKG was repeated and appeared stable when compared to previous EKG.  She also had orthostatic blood pressure checks which were negative.  We reduced her carvedilol from 25 twice daily to 12.5 twice daily, but today she tells me her dizziness has not improved with this.  She reports a strong family history of cardiovascular disease with multiple family members having either heart attacks or strokes.  As far as she knows she has never had a stroke.  She is on aspirin and atorvastatin, last LDL collected in April 2022 was 42.  Last A1c collected about 1 month ago was 6.7.  Metabolic and was collected last office visit which showed improvement in kidney function with a EGFR of 61 (improved from 55).  She does have mild anemia with hemoglobin of 11.1.  She is on iron supplementation currently and tells me she has had longstanding history of anemia.  Thyroid testing was also completed at last office visit and was within normal limits.  Past Medical History:  Diagnosis Date   Anemia    Anxiety and  depression    Arthritis    Asthma    Cataract    Degenerative arthritis    Depression    Diabetes mellitus, type 2 (HCC)    Fatigue    GERD (gastroesophageal reflux disease)    Glaucoma    Heart palpitations    Hemorrhoids    Hyperlipidemia    Hypertension    Hypothyroidism    Hypoxemia 11/24/2013   Memory deficit 10/04/2013   Obesity    Sleep apnea    BiPAP   Snoring disorder       Family History  Problem Relation Age of Onset   Alcohol abuse Mother    Heart attack Mother    Coronary artery disease Brother    Heart attack Brother    Hypertension Brother    Hyperlipidemia Brother    Heart attack Father    Hypertension Father    Hyperlipidemia Father    Heart disease Sister    Atrial fibrillation Sister    Hypertension Sister    Hyperlipidemia Sister    Hypertension Other        family history   Alcohol abuse Other    Colon cancer Brother 45   Hypertension Brother    Hyperlipidemia Brother    Dementia Brother    Hypertension Brother    Hyperlipidemia Brother    Diabetes Son    Hypertension Son  Hyperlipidemia Son    Diabetes Son    Hypertension Son    Hyperlipidemia Son    Cerebral palsy Son    Hypertension Son    Hyperlipidemia Son     Social History   Social History Narrative   Client's spouse recently passed Feb 2021    also caregiver for son who is 41 yo and is disabled.   Social History   Tobacco Use   Smoking status: Never   Smokeless tobacco: Never  Substance Use Topics   Alcohol use: No    Alcohol/week: 0.0 standard drinks     Current Meds  Medication Sig   albuterol (PROVENTIL) (2.5 MG/3ML) 0.083% nebulizer solution    albuterol (VENTOLIN HFA) 108 (90 Base) MCG/ACT inhaler Inhale 2 puffs into the lungs every 6 (six) hours as needed. For shortness of breath   aspirin 81 MG tablet Take 81 mg by mouth daily.    atorvastatin (LIPITOR) 20 MG tablet Take 1 tablet (20 mg total) by mouth daily.   budesonide-formoterol (SYMBICORT)  80-4.5 MCG/ACT inhaler Inhale 2 puffs into the lungs 2 (two) times daily as needed.   chlorthalidone (HYGROTON) 25 MG tablet TAKE 1 TABLET (25 MG TOTAL) BY MOUTH DAILY.   clobetasol ointment (TEMOVATE) [id number] % Apply 1 application topically 2 (two) times daily.   Dulaglutide (TRULICITY) 1.5 0000000 SOPN Inject 0.5 mLs (1.5 mg total) into the skin once a week.   Empagliflozin-metFORMIN HCl (SYNJARDY) 12.5-500 MG TABS Take 1 tablet by mouth 2 (two) times daily.   famotidine (PEPCID) 40 MG tablet TAKE 1 TABLET BY MOUTH EVERY DAY   ferrous sulfate 325 (65 FE) MG tablet TAKE 1 TABLET BY MOUTH TWICE DAILY WITH A MEAL   gabapentin (NEURONTIN) 300 MG capsule TAKE 1 CAPSULE BY MOUTH THREE TIMES A DAY   glucose blood (FREESTYLE TEST STRIPS) test strip USE TO TEST BLOOD SUGAR 3 TIMES A DAY. DX: E11.8   Insulin Pen Needle 31G X 5 MM MISC Use once daily with insulin   irbesartan (AVAPRO) 300 MG tablet TAKE 0.5 TABLETS BY MOUTH DAILY.   latanoprost (XALATAN) 0.005 % ophthalmic solution Place 1 drop into both eyes at bedtime.    nystatin ointment (MYCOSTATIN) APPLY ON THE SKIN THREE TIMES DAILY   oxyCODONE-acetaminophen (PERCOCET/ROXICET) 5-325 MG tablet Take 1 tablet by mouth every 8 (eight) hours as needed for severe pain.   pantoprazole (PROTONIX) 40 MG tablet TAKE 1 TABLET BY MOUTH 2 TIMES DAILY BEFORE A MEAL   potassium chloride SA (KLOR-CON M20) 20 MEQ tablet TAKE 1 TABLET BY MOUTH TWICE A DAY   promethazine (PHENERGAN) 12.5 MG tablet Take 1 tablet (12.5 mg total) by mouth every 6 (six) hours as needed for nausea or vomiting.   VITAMIN D3 SUPER STRENGTH 50 MCG (2000 UT) tablet TAKE 1 TABLET BY MOUTH EVERY DAY   [DISCONTINUED] carvedilol (COREG) 25 MG tablet Take 0.5 tablets (12.5 mg total) by mouth 2 (two) times daily with a meal.    ROS:  See HPI   Objective:   Today's Vitals: BP 140/82 (BP Location: Left Arm, Patient Position: Sitting, Cuff Size: Large)   Pulse 79   Temp 98.3 F (36.8 C) (Oral)    Ht '5\' 6"'$  (1.676 m)   Wt 230 lb (104.3 kg)   SpO2 98%   BMI 37.12 kg/m     03/12/2022   11:23 AM 03/06/2022    4:41 PM 02/02/2022    1:10 PM  Vitals with BMI  Height 5'  6" '5\' 6"'$  '5\' 6"'$   Weight 230 lbs 230 lbs 3 oz 224 lbs  BMI 37.14 99991111 0000000  Systolic [id number] 0000000  Diastolic 82 82 82  Pulse 79 80 78     Physical Exam Vitals reviewed.  Constitutional:      General: She is not in acute distress.    Appearance: Normal appearance.  HENT:     Head: Normocephalic and atraumatic.  Neck:     Vascular: No carotid bruit.  Cardiovascular:     Rate and Rhythm: Normal rate and regular rhythm.     Pulses: Normal pulses.     Heart sounds: Normal heart sounds.  Pulmonary:     Effort: Pulmonary effort is normal.     Breath sounds: Normal breath sounds.  Skin:    General: Skin is warm and dry.  Neurological:     General: No focal deficit present.     Mental Status: She is alert and oriented to person, place, and time.     Motor: No weakness.     Coordination: Coordination is intact.     Gait: Gait is intact.  Psychiatric:        Mood and Affect: Mood normal.        Behavior: Behavior normal.        Judgment: Judgment normal.         Assessment and Plan   1. Dizziness   2. Essential hypertension, benign      Plan: 1.,  2. Etiology still unclear, high on my differentials include TIA, CAD, polypharmacy.  Dizziness appears stable, vital signs are still stable, so far testing has not uncovered significant abnormalities (diabetes well controlled, normal thyroid functioning, hyperlipidemia well controlled, blood pressure fairly well controlled on current antihypertensives, kidney function stable, anemia stable).  Reduced dose of Coreg did not improve dizziness, so I recommended she return back to 25 mg by mouth twice a day.  I have also recommend she reach out to her cardiologist to see if she should still undergo CTA for evaluation of possible heart disease. She tells me she  understands and will go to their office today. She also has an appointment coming up with neurology for further evaluation of her ataxia and dizziness.  She was encouraged to keep this appointment.  She reports understanding.   Tests ordered No orders of the defined types were placed in this encounter.     Meds ordered this encounter  Medications   carvedilol (COREG) 25 MG tablet    Sig: Take 1 tablet (25 mg total) by mouth 2 (two) times daily with a meal.    Dispense:  180 tablet    Refill:  0    Order Specific Question:   Supervising Provider    Answer:   Binnie Rail [id number]    Patient to follow-up in in approximately 6 weeks as scheduled PCP, or sooner as needed.  Ailene Ards, NP

## 2022-03-17 ENCOUNTER — Telehealth: Payer: Self-pay

## 2022-03-17 DIAGNOSIS — F330 Major depressive disorder, recurrent, mild: Secondary | ICD-10-CM | POA: Diagnosis not present

## 2022-03-17 DIAGNOSIS — Z7984 Long term (current) use of oral hypoglycemic drugs: Secondary | ICD-10-CM | POA: Diagnosis not present

## 2022-03-17 DIAGNOSIS — E1169 Type 2 diabetes mellitus with other specified complication: Secondary | ICD-10-CM | POA: Diagnosis not present

## 2022-03-17 DIAGNOSIS — Z7985 Long-term (current) use of injectable non-insulin antidiabetic drugs: Secondary | ICD-10-CM | POA: Diagnosis not present

## 2022-03-17 DIAGNOSIS — R072 Precordial pain: Secondary | ICD-10-CM

## 2022-03-17 DIAGNOSIS — Z6836 Body mass index (BMI) 36.0-36.9, adult: Secondary | ICD-10-CM | POA: Diagnosis not present

## 2022-03-17 DIAGNOSIS — I10 Essential (primary) hypertension: Secondary | ICD-10-CM | POA: Diagnosis not present

## 2022-03-17 MED ORDER — METOPROLOL TARTRATE 100 MG PO TABS
100.0000 mg | ORAL_TABLET | Freq: Once | ORAL | 0 refills | Status: DC
Start: 2022-03-17 — End: 2022-04-07

## 2022-03-17 NOTE — Telephone Encounter (Addendum)
-----   Message from Lorretta Harp, MD  ----- ?Some score of 499.  She gets atypical chest pain.  Please order a coronary CTA to further evaluate. ? ?Spoke with pt regarding this procedure. Instructions given for procedure. Lab orders and instructions mailed to pt. Prescription sent to pt's pharmacy of choice. Pt verbalized understanding.  ?

## 2022-03-23 ENCOUNTER — Telehealth: Payer: Self-pay | Admitting: Internal Medicine

## 2022-03-23 NOTE — Telephone Encounter (Signed)
Pt called and states pharmacy told her that there were no more refills on Empagliflozin-metFORMIN HCl (SYNJARDY) 12.5-500 MG TABS.  ? ?Requesting that med be called in. [facility]  ?

## 2022-03-24 NOTE — Telephone Encounter (Signed)
Refill for [facility] has been faxed to [facility] as requested  ? ? ?

## 2022-03-30 ENCOUNTER — Telehealth: Payer: Self-pay | Admitting: Internal Medicine

## 2022-03-30 DIAGNOSIS — R072 Precordial pain: Secondary | ICD-10-CM | POA: Diagnosis not present

## 2022-03-30 LAB — BASIC METABOLIC PANEL
BUN/Creatinine Ratio: 15 (ref 12–28)
BUN: 14 mg/dL (ref 8–27)
CO2: 25 mmol/L (ref 20–29)
Calcium: 10.2 mg/dL (ref 8.7–10.3)
Chloride: 101 mmol/L (ref 96–106)
Creatinine, Ser: 0.96 mg/dL (ref 0.57–1.00)
Glucose: 126 mg/dL — ABNORMAL HIGH (ref 70–99)
Potassium: 4.3 mmol/L (ref 3.5–5.2)
Sodium: 143 mmol/L (ref 134–144)
eGFR: 62 mL/min/{1.73_m2} (ref >59)

## 2022-03-30 NOTE — Telephone Encounter (Signed)
Resent refill for synjardy to [facility] ? ? ?

## 2022-03-30 NOTE — Telephone Encounter (Signed)
Valerie [name] called and states they need a new rx for Empagliflozin-metFORMIN HCl (SYNJARDY) 12.5-500 MG TABS. ?

## 2022-04-03 ENCOUNTER — Telehealth (HOSPITAL_COMMUNITY): Payer: Self-pay | Admitting: *Deleted

## 2022-04-03 NOTE — Telephone Encounter (Signed)
Attempted to call patient regarding upcoming cardiac CT appointment. °Left message on voicemail with name and callback number ° °Dmetrius Ambs RN Navigator Cardiac Imaging °[facility] °[phone] Office °[phone] Cell ° °

## 2022-04-04 ENCOUNTER — Other Ambulatory Visit: Payer: Self-pay | Admitting: Internal Medicine

## 2022-04-04 DIAGNOSIS — E876 Hypokalemia: Secondary | ICD-10-CM

## 2022-04-04 DIAGNOSIS — E785 Hyperlipidemia, unspecified: Secondary | ICD-10-CM

## 2022-04-04 DIAGNOSIS — I10 Essential (primary) hypertension: Secondary | ICD-10-CM

## 2022-04-06 ENCOUNTER — Ambulatory Visit (HOSPITAL_COMMUNITY)
Admission: RE | Admit: 2022-04-06 | Discharge: 2022-04-06 | Disposition: A | Discharge status: Home / Self Care | Payer: HMO | Source: Ambulatory Visit | Attending: Cardiovascular Disease | Admitting: Cardiovascular Disease

## 2022-04-06 DIAGNOSIS — R072 Precordial pain: Secondary | ICD-10-CM

## 2022-04-06 DIAGNOSIS — I2510 Atherosclerotic heart disease of native coronary artery without angina pectoris: Secondary | ICD-10-CM | POA: Diagnosis not present

## 2022-04-06 MED ORDER — IOHEXOL 350 MG/ML SOLN
95.0000 mL | Freq: Once | INTRAVENOUS | Status: AC | PRN
  Administered 2022-04-06: 95 mL via INTRAVENOUS

## 2022-04-06 MED ORDER — METOPROLOL TARTRATE 5 MG/5ML IV SOLN
INTRAVENOUS | Status: AC
  Administered 2022-04-06: 10 mg via INTRAVENOUS
  Filled 2022-04-06: qty 20

## 2022-04-06 MED ORDER — NITROGLYCERIN 0.4 MG SL SUBL
SUBLINGUAL_TABLET | SUBLINGUAL | Status: AC
  Administered 2022-04-06: 0.8 mg via SUBLINGUAL
  Filled 2022-04-06: qty 2

## 2022-04-06 MED ORDER — DILTIAZEM HCL 25 MG/5ML IV SOLN
INTRAVENOUS | Status: AC
  Administered 2022-04-06: 10 mg via INTRAVENOUS
  Filled 2022-04-06: qty 5

## 2022-04-06 MED ORDER — DILTIAZEM HCL 25 MG/5ML IV SOLN
10.0000 mg | INTRAVENOUS | Status: DC | PRN
Start: 2022-04-06 — End: 2022-04-07

## 2022-04-06 MED ORDER — METOPROLOL TARTRATE 5 MG/5ML IV SOLN
10.0000 mg | INTRAVENOUS | Status: DC | PRN
  Administered 2022-04-06: 10 mg via INTRAVENOUS

## 2022-04-06 MED ORDER — NITROGLYCERIN 0.4 MG SL SUBL: 0.8000 mg | SUBLINGUAL_TABLET | Freq: Once | SUBLINGUAL | Status: AC

## 2022-04-07 ENCOUNTER — Other Ambulatory Visit: Payer: Self-pay | Admitting: Internal Medicine

## 2022-04-07 ENCOUNTER — Ambulatory Visit (INDEPENDENT_AMBULATORY_CARE_PROVIDER_SITE_OTHER): Payer: HMO

## 2022-04-07 DIAGNOSIS — I10 Essential (primary) hypertension: Secondary | ICD-10-CM

## 2022-04-07 DIAGNOSIS — D509 Iron deficiency anemia, unspecified: Secondary | ICD-10-CM

## 2022-04-07 DIAGNOSIS — E118 Type 2 diabetes mellitus with unspecified complications: Secondary | ICD-10-CM

## 2022-04-07 DIAGNOSIS — E785 Hyperlipidemia, unspecified: Secondary | ICD-10-CM

## 2022-04-07 NOTE — Patient Instructions (Signed)
Visit Information ? ?Following are the goals we discussed today:  ? ?Manage My Medicine  ? ?Timeframe:  Long-Range Goal ?Priority:  Medium ?Start Date:   05/30/21                          ?Expected End Date:    12/13/2022                  ? ?Follow Up Date: 09/2022 ?  ?- call for medicine refill 2 or 3 days before it runs out ?- call if I am sick and can't take my medicine ?- keep a list of all the medicines I take; vitamins and herbals too ?- use a pillbox to sort medicine  ?  ?Why is this important?   ?These steps will help you keep on track with your medicines. ? ?Plan: Telephone follow up appointment with care management team member scheduled for:  6 months ?The patient has been provided with contact information for the care management team and has been advised to call with any health related questions or concerns.  ? ?Tomasa Blase, PharmD ?Clinical Pharmacist, [place]  ? ?Please call the care guide team at [phone] if you need to cancel or reschedule your appointment.  ? ?The patient verbalized understanding of instructions, educational materials, and care plan provided today and declined offer to receive copy of patient instructions, educational materials, and care plan.  ? ?

## 2022-04-07 NOTE — Progress Notes (Signed)
Chronic Care Management Pharmacy Note  04/07/2022 Name:  Valerie [name] MRN:  [id number] DOB:  May 17, 1946  Summary: -Patient reports compliance to current medications, notes that she has been able to receive all prescribed medications - PAP approved for kerendia, trulicity, sybmicort, and synjardy -Pt checking BP at home, averaging ~124/73-130/80's - at times can have BP in the 100's/60's where she can feel fatigued / dizzy  -BG fairly well controlled -averaging 124-150 in AM when she is checking  -Notes that she has been using symbicort as needed, does not currently have albuterol inhaler   Recommendations/Changes made from today's visit: -BP and BG well controlled - advised for patient to continue to monitor, to reach out should control of either be lost  -Advised to use symbicort - 2 puffs twice daily, will reach out for albuterol inhaler which can then be used as needed   Subjective: Valerie [name] is an 76 y.o. year old female who is a primary patient of Janith Lima, MD.  The CCM team was consulted for assistance with disease management and care coordination needs.    Engaged with patient by telephone for follow up visit in response to provider referral for pharmacy case management and/or care coordination services.   Consent to Services:  The patient was given information about Chronic Care Management services, agreed to services, and gave verbal consent prior to initiation of services.  Please see initial visit note for detailed documentation.   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 Her husband passed  in 2021 and it has been very difficult for her and her disabled son she cares for.  Recent office visits: 03/12/2022 - Jeralyn Ruths NP - increase coreg back to '25mg'$  BID - follow up with cardiologist - possible she may still undergo CTA for possible heart disease - f/u in 6 weeks  03/06/2022 - Jeralyn Ruths - NP - dizziness - coreg decreased to 12.'5mg'$  BID  02/02/2022 - Dr. Ronnald Ramp - promethazine rx'd for gastroenteritis  - f/u in 3 months   Recent consult visits: 11/14/2021 - Dr. Gwenlyn Found - Cardiology - remains stable, gets occasional chest pains / palpitations - no changes to medications   Hospital visits: None in previous 6 months   Objective:  Lab Results  Component Value Date   CREATININE 0.96 03/30/2022   BUN 14 03/30/2022   GFR 61.55 03/09/2022   GFRNONAA >60 08/29/2015   GFRAA >60 08/29/2015   NA 143 03/30/2022   K 4.3 03/30/2022   CALCIUM 10.2 03/30/2022   CO2 25 03/30/2022   GLUCOSE 126 (H) 03/30/2022    Lab Results  Component Value Date/Time   HGBA1C 6.7 (H) 02/02/2022 02:08 PM   HGBA1C 6.7 (H) 08/06/2021 02:11 PM   GFR 61.55 03/09/2022 10:35 AM   GFR 55.21 (L) 09/25/2021 02:17 PM   MICROALBUR <0.7 09/25/2021 02:17 PM   MICROALBUR 0.5 07/01/2020 11:43 AM    Last diabetic Eye exam:  Lab Results  Component Value Date/Time   HMDIABEYEEXA No Retinopathy 03/11/2021 12:00 AM    Last diabetic Foot exam:  Lab Results  Component Value Date/Time   HMDIABFOOTEX done 06/20/2018  12:00 AM     Lab Results  Component Value Date   CHOL 115 04/10/2021   HDL 40.40 04/10/2021   LDLCALC 43 03/28/2014   LDLDIRECT 42.0 04/10/2021   TRIG 232.0 (H) 04/10/2021   CHOLHDL 3 04/10/2021       Latest Ref Rng & Units 03/09/2022   10:35 AM 04/10/2021    2:13 PM 02/21/2020    2:22 PM  Hepatic Function  Total Protein 6.0 - 8.3 g/dL 7.5   7.7   7.7    Albumin 3.5 - 5.2 g/dL 4.3   4.2   4.2    AST 0 - 37 U/L '14   17   18    '$ ALT 0 - 35 U/L '15   20   18    '$ Alk Phosphatase 39 - 117 U/L 100    109   95    Total Bilirubin 0.2 - 1.2 mg/dL 0.5   0.4   0.6    Bilirubin, Direct 0.0 - 0.3 mg/dL  0.1   0.0      Lab Results  Component Value Date/Time   TSH 2.61 03/09/2022 10:35 AM   TSH 2.17 02/02/2022 02:08 PM   FREET4 0.65 03/09/2022 10:35 AM       Latest Ref Rng & Units 03/09/2022   10:35 AM 02/02/2022    2:08 PM 08/06/2021    2:11 PM  CBC  WBC 4.0 - 10.5 K/uL 9.3   8.9   10.6    Hemoglobin 12.0 - 15.0 g/dL 11.1   11.7   11.2    Hematocrit 36.0 - 46.0 % 33.9   35.0   33.7    Platelets 150.0 - 400.0 K/uL 354.0   350.0   388.0     Iron/TIBC/Ferritin/ %Sat    Component Value Date/Time   IRON 73 04/10/2021 1413   TIBC 356 07/01/2020 1146   FERRITIN 68.2 04/10/2021 1413   IRONPCTSAT 23 07/01/2020 1146     Lab Results  Component Value Date/Time   VD25OH 36 07/01/2020 11:43 AM   VD25OH 66.29 02/21/2020 02:22 PM   VD25OH 82.13 09/13/2019 04:58 PM    Clinical ASCVD: No  The ASCVD Risk score (Arnett DK, et al., 2019) failed to calculate for the following reasons:   The valid total cholesterol range is 130 to 320 mg/dL       02/02/2022    2:23 PM 02/27/2021   11:08 AM 05/27/2020    1:15 PM  Depression screen PHQ 2/9  Decreased Interest '1 1 1  '$ Down, Depressed, Hopeless 0 3 1  PHQ - 2 Score '1 4 2  '$ Altered sleeping '1 3 2  '$ Tired, decreased energy '1 3 2  '$ Change in appetite '1 3 2  '$ Feeling bad or failure about yourself  0 0 0  Trouble concentrating 0 3 3  Moving slowly or fidgety/restless 0 0 1  Suicidal thoughts 0 0 0  PHQ-9 Score '4 16 12  '$ Difficult doing work/chores  Somewhat difficult Very difficult      Social History   Tobacco Use  Smoking Status Never  Smokeless Tobacco Never   BP Readings from Last 3 Encounters:  04/06/22 120/74  03/12/22 140/82  03/06/22 140/82   Pulse Readings from Last 3 Encounters:  03/12/22 79  03/06/22 80  02/02/22 78   Wt Readings from Last 3 Encounters:  03/12/22 230 lb (104.3 kg)  03/06/22 230 lb 3.2 oz (104.4 kg)   02/02/22 224 lb (101.6 kg)  BMI Readings from Last 3 Encounters:  03/12/22 37.12 kg/m  03/06/22 37.16 kg/m  02/02/22 36.15 kg/m    Assessment/Interventions: Review of patient past medical history, allergies, medications, health status, including review of consultants reports, laboratory and other test data, was performed as part of comprehensive evaluation and provision of chronic care management services.   SDOH:  (Social Determinants of Health) assessments and interventions performed: Yes  SDOH Screenings   Alcohol Screen: Not on file  Depression (PHQ2-9): Low Risk    PHQ-2 Score: 4  Financial Resource Strain: Not on file  Food Insecurity: Not on file  Housing: Not on file  Physical Activity: Not on file  Social Connections: Not on file  Stress: Not on file  Tobacco Use: Low Risk    Smoking Tobacco Use: Never   Smokeless Tobacco Use: Never   Passive Exposure: Not on file  Transportation Needs: Not on file    [place]  Allergies  Allergen Reactions   Food Swelling    bananas   Penicillins Swelling and Rash    Medications Reviewed Today     Reviewed by Debera Lat, CMA (Certified Medical Assistant) on 03/12/22 at 1123  Med List Status: <None>   Medication Order Taking? Sig Documenting Provider Last Dose Status Informant  albuterol (PROVENTIL) (2.5 MG/3ML) 0.083% nebulizer solution [id number]   [provider]  Active            Med Note Kellie Simmering, Harle Battiest   Wed Apr 30, 2021  1:44 PM)    albuterol (VENTOLIN HFA) 108 (90 Base) MCG/ACT inhaler [id number]  Inhale 2 puffs into the lungs every 6 (six) hours as needed. For shortness of breath Janith Lima, MD  Active            Med Note Rosana Hoes, SOPHIA A   Tue Jul 02, 2020 10:27 AM)    aspirin 81 MG tablet [id number]  Take 81 mg by mouth daily.  [provider]  Active Self  atorvastatin (LIPITOR) 20 MG tablet [id number]  Take 1 tablet (20 mg total) by mouth daily. Janith Lima, MD   Active   budesonide-formoterol [facility]) 80-4.5 MCG/ACT inhaler [id number]  Inhale 2 puffs into the lungs 2 (two) times daily as needed. Janith Lima, MD  Active   carvedilol (COREG) 25 MG tablet [id number]  Take 0.5 tablets (12.5 mg total) by mouth 2 (two) times daily with a meal. Ailene Ards, NP  Active   chlorthalidone (HYGROTON) 25 MG tablet [id number]  TAKE 1 TABLET (25 MG TOTAL) BY MOUTH DAILY. Janith Lima, MD  Active   clobetasol ointment (TEMOVATE) 0.05 % [id number]  Apply 1 application topically 2 (two) times daily. Janith Lima, MD  Active   Dulaglutide (TRULICITY) 1.5 0000000 [facility] [facility] [id number]  Inject 0.5 mLs (1.5 mg total) into the skin once a week. Janith Lima, MD  Active            Med Note Alvin Critchley   Wed Apr 30, 2021  1:44 PM)    Empagliflozin-metFORMIN HCl [facility]) 12.5-500 MG TABS [id number]  Take 1 tablet by mouth 2 (two) times daily. Janith Lima, MD  Active            Med Note Elyse Hsu Apr 30, 2021  1:44 PM)    famotidine (PEPCID) 40 MG tablet [id number]  TAKE 1 TABLET BY MOUTH EVERY DAY Janith Lima, MD  Active   ferrous sulfate 325 (65  FE) MG tablet [id number]  TAKE 1 TABLET BY MOUTH TWICE DAILY WITH A MEAL Janith Lima, MD  Active   gabapentin (NEURONTIN) 300 MG capsule [id number]  TAKE 1 CAPSULE BY MOUTH THREE TIMES A DAY Janith Lima, MD  Active   glucose blood (FREESTYLE TEST STRIPS) test strip [id number]  USE TO TEST BLOOD SUGAR 3 TIMES A DAY. DX: E11.8 Janith Lima, MD  Active   Insulin Pen Needle 31G X 5 MM MISC [id number]  Use once daily with insulin Janith Lima, MD  Active   irbesartan (AVAPRO) 300 MG tablet [id number]  TAKE 0.5 TABLETS BY MOUTH DAILY. Janith Lima, MD  Active   latanoprost (XALATAN) 0.005 % ophthalmic solution [id number]  Place 1 drop into both eyes at bedtime.  [provider]  Active Self  nystatin ointment (MYCOSTATIN) [id number]  APPLY ON THE SKIN THREE TIMES DAILY [provider]   Active   oxyCODONE-acetaminophen (PERCOCET/ROXICET) 5-325 MG tablet [id number]  Take 1 tablet by mouth every 8 (eight) hours as needed for severe pain. Janith Lima, MD  Active   pantoprazole (PROTONIX) 40 MG tablet [id number]  TAKE 1 TABLET BY MOUTH 2 TIMES DAILY BEFORE A MEAL Janith Lima, MD  Active   potassium chloride SA (KLOR-CON M20) 20 MEQ tablet [id number]  TAKE 1 TABLET BY MOUTH TWICE A DAY Janith Lima, MD  Active   promethazine (PHENERGAN) 12.5 MG tablet [id number]  Take 1 tablet (12.5 mg total) by mouth every 6 (six) hours as needed for nausea or vomiting. 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 08/06/2021   Tinea corporis 10/01/2020   Degenerative arthritis of right knee 07/02/2020   Moderate episode of recurrent major depressive disorder ([place]) 07/01/2020   Esophageal dysphagia 05/27/2020   Gastroesophageal reflux disease with esophagitis without hemorrhage 05/27/2020   Diuretic-induced hypokalemia 03/27/2020   OAB (overactive bladder) 03/27/2020   Insomnia secondary to chronic pain 02/21/2020   Primary osteoarthritis involving multiple joints 10/10/2019   Thiamine deficiency, unspecified 01/09/2019   Eczema 08/18/2018   Palpitations 08/18/2018   Vitamin D deficiency disease 08/11/2018   Long-term current use of opiate analgesic 06/21/2018   External bleeding hemorrhoids 03/24/2018   Obstructive sleep apnea treated with BiPAP 11/03/2017   Osteopenia 03/28/2014   Insomnia 03/28/2014   Routine general medical examination at a health care facility 07/17/2013   Morbid obesity with BMI of 45.0-49.9, adult ([place]) 04/14/2013   Visit for screening mammogram 01/20/2013   Spinal  stenosis of lumbar 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 10/05/2012   Hypothyroidism 02/20/2011   Fatty liver disease, nonalcoholic [id number]   Mild intermittent asthma 10/25/2009   Type II diabetes mellitus with manifestations ([place]) 05/06/2009   Hyperlipidemia with target LDL less than 100 05/06/2009   B12 deficiency anemia 05/06/2009   Depression with anxiety 05/06/2009   Essential hypertension, benign 05/06/2009   GERD 05/06/2009    Immunization History  Administered Date(s) Administered   Fluad Quad(high Dose 65+) 09/13/2019, 09/25/2021   Influenza Split 09/21/2011, 08/19/2012   Influenza Whole 09/25/2009, 09/04/2010   Influenza, High Dose Seasonal PF 09/23/2016,  10/06/2017, 08/18/2018   Influenza,inj,Quad PF,6+ Mos 09/01/2013, 11/29/2014, 08/30/2015, 08/13/2020   PFIZER(Purple Top)SARS-COV-2 Vaccination 02/11/2020, 03/06/2020, 09/19/2020   Pneumococcal Conjugate-13 07/13/2014   Pneumococcal Polysaccharide-23 01/20/2013, 04/18/2018   Td 02/05/2010   Tdap 06/02/2017   Zoster Recombinat (Shingrix) 08/18/2018, 01/04/2019    Conditions to be addressed/monitored:  Hypertension, Hyperlipidemia, Diabetes, Asthma, and Pain  Care Plan : [place]  Updates made by Tomasa Blase, RPH since 04/07/2022 12:00 AM     Problem: Hypertension, Hyperlipidemia, Diabetes, Asthma, and Pain   Priority: High     Long-Range Goal: Disease management   Start Date: 05/30/2021  Expected End Date: 04/08/2023  This Visit's Progress: On track  Recent Progress: On track  Priority: High  Note:   Current Barriers:  Unable to independently monitor therapeutic efficacy  Pharmacist Clinical Goal(s):  Patient will achieve adherence to monitoring guidelines and medication adherence to achieve therapeutic efficacy through collaboration with PharmD and provider.   Interventions: 1:1 collaboration with Janith Lima, MD regarding development and update of comprehensive plan of care as evidenced by  provider attestation and co-signature Inter-disciplinary care team collaboration (see longitudinal plan of care) Comprehensive medication review performed; medication list updated in electronic medical record  Hypertension  Controlled  BP goal is:  <130/80 Patient checks BP at home daily Patient home BP readings are ranging: 124/73-137/79 Patient has failed these meds in the past: n/a Patient is currently controlled on the following medications: Irbesartan 300 mg - 1/2 tablet daily (pt takes when BP > 160/100, occurs once or twice a month) Chlorthalidone 25 mg daily Carvedilol 25 mg BID    We discussed: Pt reports BP is at goal at home; she endorses compliance and denies issues   Plan: Recommend to continue current medication   Hyperlipidemia  Controlled  LDL goal < 100 Significant family history of MI, no personal hx of MI or stroke Last LDL: 42 Patient has failed these meds in past: n/a Patient is currently controlled on the following medications: Atorvastatin 20 mg daily HS Aspirin 81 mg daily   We discussed:  LDL is at goal; pt endorses compliance and denies issues   Plan: Continue current medications   Diabetes   Controlled A1c goal <7% Lab Results  Component Value Date   HGBA1C 6.7 (H) 02/02/2022  Checking BG: Daily Recent FBG Readings: 120-150   Patient has failed these meds in past: Invokana, Farxiga, Janumet, pioglitazone, nateglinide Patient is currently controlled on the following medications: Trulicity 1.5 mg weekly Friday (via PAP) Synjardy 12.5-500 mg BID (via PAP)    We discussed: A1c is at goal; pt endorses compliance and denies issues   Plan: Continue current medications and control with diet and exercise    CKD Stage 3a  Patient is currently controlled on the following medications:  Kerendia 20 mg daily (via PAP)  We discussed:  CKD course and progression; emphasized importance of BP and DM control as well as hyrdration; advised to avoid  NSAIDs  Plan: Continue current medications  Patient Goals/Self-Care Activities Patient will:  - take medications as prescribed -focus on medication adherence by pill box -check glucose daily -check blood pressure daily      Care Gaps: COVID booster    Patient's preferred pharmacy is:  [id number] [address] [place] [address]Phone: [phone]:[phone] [place] [id number] [place] ([place]) [place] Phone: [phone] Fax: [place], [place]. [place] [address] Phone: [phone] Fax: [phone]   Uses pill box? Yes Pt endorses 100% compliance  Care Plan and Follow Up Patient Decision:  Patient agrees to Care Plan and Follow-up.  Plan: Telephone follow up appointment with care management team member scheduled for:  6 months  Tomasa Blase, PharmD Clinical Pharmacist, [place]

## 2022-04-12 DIAGNOSIS — E785 Hyperlipidemia, unspecified: Secondary | ICD-10-CM

## 2022-04-12 DIAGNOSIS — E1122 Type 2 diabetes mellitus with diabetic chronic kidney disease: Secondary | ICD-10-CM | POA: Diagnosis not present

## 2022-04-12 DIAGNOSIS — I129 Hypertensive chronic kidney disease with stage 1 through stage 4 chronic kidney disease, or unspecified chronic kidney disease: Secondary | ICD-10-CM | POA: Diagnosis not present

## 2022-04-12 DIAGNOSIS — Z7984 Long term (current) use of oral hypoglycemic drugs: Secondary | ICD-10-CM

## 2022-04-12 DIAGNOSIS — N1831 Chronic kidney disease, stage 3a: Secondary | ICD-10-CM

## 2022-04-13 ENCOUNTER — Other Ambulatory Visit: Payer: Self-pay | Admitting: Internal Medicine

## 2022-04-13 DIAGNOSIS — J4520 Mild intermittent asthma, uncomplicated: Secondary | ICD-10-CM

## 2022-04-13 MED ORDER — ALBUTEROL SULFATE HFA 108 (90 BASE) MCG/ACT IN AERS: 2.0000 | INHALATION_SPRAY | Freq: Four times a day (QID) | RESPIRATORY_TRACT | 4 refills | Status: DC | PRN

## 2022-04-13 MED ORDER — ALBUTEROL SULFATE (2.5 MG/3ML) 0.083% IN NEBU: 2.5000 mg | INHALATION_SOLUTION | Freq: Four times a day (QID) | RESPIRATORY_TRACT | 3 refills | Status: AC | PRN

## 2022-04-15 ENCOUNTER — Encounter: Payer: Self-pay | Admitting: Neurology

## 2022-04-15 ENCOUNTER — Ambulatory Visit: Payer: HMO | Admitting: Neurology

## 2022-04-15 DIAGNOSIS — H81319 Aural vertigo, unspecified ear: Secondary | ICD-10-CM | POA: Diagnosis not present

## 2022-04-15 DIAGNOSIS — M25471 Effusion, right ankle: Secondary | ICD-10-CM | POA: Diagnosis not present

## 2022-04-15 DIAGNOSIS — G629 Polyneuropathy, unspecified: Secondary | ICD-10-CM

## 2022-04-15 DIAGNOSIS — Z6841 Body Mass Index (BMI) 40.0 and over, adult: Secondary | ICD-10-CM | POA: Diagnosis not present

## 2022-04-15 DIAGNOSIS — R29818 Other symptoms and signs involving the nervous system: Secondary | ICD-10-CM

## 2022-04-15 DIAGNOSIS — M25472 Effusion, left ankle: Secondary | ICD-10-CM | POA: Diagnosis not present

## 2022-04-15 DIAGNOSIS — R269 Unspecified abnormalities of gait and mobility: Secondary | ICD-10-CM | POA: Diagnosis not present

## 2022-04-15 NOTE — Patient Instructions (Signed)
Vertigo ?Vertigo is the feeling that you or the things around you are moving when they are not. This feeling can come and go at any time. Vertigo often goes away on its own. This condition can be dangerous if it happens when you are doing activities like driving or working with machines. ?Your doctor will do tests to find the cause of your vertigo. These tests will also help your doctor decide on the best treatment for you. ?Follow these instructions at home: ?Eating and drinking ? ?  ? ?Drink enough fluid to keep your pee (urine) pale yellow. ?Do not drink alcohol. ?Activity ?Return to your normal activities when your doctor says that it is safe. ?In the morning, first sit up on the side of the bed. When you feel okay, stand slowly while you hold onto something until you know that your balance is fine. ?Move slowly. Avoid sudden body or head movements or certain positions, as told by your doctor. ?Use a cane if you have trouble standing or walking. ?Sit down right away if you feel dizzy. ?Avoid doing any tasks or activities that can cause danger to you or others if you get dizzy. ?Avoid bending down if you feel dizzy. Place items in your home so that they are easy for you to reach without bending or leaning over. ?Do not drive or use machinery if you feel dizzy. ?General instructions ?Take over-the-counter and prescription medicines only as told by your doctor. ?Keep all follow-up visits. ?Contact a doctor if: ?Your medicine does not help your vertigo. ?Your problems get worse or you have new symptoms. ?You have a fever. ?You feel like you may vomit (nauseous), or this feeling gets worse. ?You start to vomit. ?Your family or friends see changes in how you act. ?You lose feeling (have numbness) in part of your body. ?You feel prickling and tingling in a part of your body. ?Get help right away if: ?You are always dizzy. ?You faint. ?You get very bad headaches. ?You get a stiff neck. ?Bright light starts to bother  you. ?You have trouble moving or talking. ?You feel weak in your hands, arms, or legs. ?You have changes in your hearing or in how you see (vision). ?These symptoms may be an emergency. Get help right away. Call your local emergency services (911 in the [place] ?Do not wait to see if the symptoms will go away. ?Do not drive yourself to the hospital. ?Summary ?Vertigo is the feeling that you or the things around you are moving when they are not. ?Your doctor will do tests to find the cause of your vertigo. ?You may be told to avoid some tasks, positions, or movements. ?Contact a doctor if your medicine is not helping, or if you have a fever, new symptoms, or a change in how you act. ?Get help right away if you get very bad headaches, or if you have changes in how you speak, hear, or see. ?This information is not intended to replace advice given to you by your health care provider. Make sure you discuss any questions you have with your health care provider. ?Document Revised: 10/30/2020 Document Reviewed: 10/30/2020 ?Elsevier Patient Education ? [place]. ?Dizziness ?Dizziness is a common problem. It is a feeling of unsteadiness or light-headedness. You may feel like you are about to faint. Dizziness can lead to injury if you stumble or fall. Anyone can become dizzy, but dizziness is more common in older adults. This condition can be caused by a number  of things, including medicines, dehydration, or illness. ?Follow these instructions at home: ?Eating and drinking ? ?Drink enough fluid to keep your urine pale yellow. This helps to keep you from becoming dehydrated. Try to drink more clear fluids, such as water. ?Do not drink alcohol. ?Limit your caffeine intake if told to do so by your health care provider. Check ingredients and nutrition facts to see if a food or beverage contains caffeine. ?Limit your salt (sodium) intake if told to do so by your health care provider. Check ingredients and nutrition facts to  see if a food or beverage contains sodium. ?Activity ? ?Avoid making quick movements. ?Rise slowly from chairs and steady yourself until you feel okay. ?In the morning, first sit up on the side of the bed. When you feel okay, stand slowly while you hold onto something until you know that your balance is good. ?If you need to stand in one place for a long time, move your legs often. Tighten and relax the muscles in your legs while you are standing. ?Do not drive or use machinery if you feel dizzy. ?Avoid bending down if you feel dizzy. Place items in your home so that they are easy for you to reach without leaning over. ?Lifestyle ?Do not use any products that contain nicotine or tobacco. These products include cigarettes, chewing tobacco, and vaping devices, such as e-cigarettes. If you need help quitting, ask your health care provider. ?Try to reduce your stress level by using methods such as yoga or meditation. Talk with your health care provider if you need help to manage your stress. ?General instructions ?Watch your dizziness for any changes. ?Take over-the-counter and prescription medicines only as told by your health care provider. Talk with your health care provider if you think that your dizziness is caused by a medicine that you are taking. ?Tell a friend or a family member that you are feeling dizzy. If he or she notices any changes in your behavior, have this person call your health care provider. ?Keep all follow-up visits. This is important. ?Contact a health care provider if: ?Your dizziness does not go away or you have new symptoms. ?Your dizziness or light-headedness gets worse. ?You feel nauseous. ?You have reduced hearing. ?You have a fever. ?You have neck pain or a stiff neck. ?Your dizziness leads to an injury or a fall. ?Get help right away if: ?You vomit or have diarrhea and are unable to eat or drink anything. ?You have problems talking, walking, swallowing, or using your arms, hands, or  legs. ?You feel generally weak. ?You have any bleeding. ?You are not thinking clearly or you have trouble forming sentences. It may take a friend or family member to notice this. ?You have chest pain, abdominal pain, shortness of breath, or sweating. ?Your vision changes or you develop a severe headache. ?These symptoms may represent a serious problem that is an emergency. Do not wait to see if the symptoms will go away. Get medical help right away. Call your local emergency services (911 in the [place] Do not drive yourself to the hospital. ?Summary ?Dizziness is a feeling of unsteadiness or light-headedness. This condition can be caused by a number of things, including medicines, dehydration, or illness. ?Anyone can become dizzy, but dizziness is more common in older adults. ?Drink enough fluid to keep your urine pale yellow. Do not drink alcohol. ?Avoid making quick movements if you feel dizzy. Monitor your dizziness for any changes. ?This information is not intended to replace  advice given to you by your health care provider. Make sure you discuss any questions you have with your health care provider. ?Document Revised: 11/04/2020 Document Reviewed: 11/04/2020 ?Elsevier Patient Education ? [place]. ? ?

## 2022-04-15 NOTE — Progress Notes (Addendum)
[facility]  Provider:  Dr Falisa Lamora Referring Provider: Ailene Ards, NP Primary Care Physician:  Janith Lima, MD  Chief Complaint  Patient presents with   Consult    Rm 10, alone. 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 Pt reports almost daily dizziness and has been stumbling. Has had almost falls or falling into something like a sofa or chair.    04-15-2022,Genessis A Boeckman was last seen for BiPAP follow up by Debbora Presto, NP  I have the pleasure of seeing Mrs. Ritzel today. Her primary neurologist has retired and therefor she will become a primary neurological patient within my sleep clinic. She is referred today for dizziness and ataxia , and at high risk of CVA due to cardiovascular disease, high calcium score, DM, Obesity, asthma and hypercholesterolemia. .   Pt reports almost daily dizziness and has been stumbling. Has had almost falls or falling into something like a sofa or chair. Dr Ronnald Ramp had ordered a  brain MRI, Cerebral volume is not significantly changed since 2013-02-22, normal for age. And scattered small foci of nonspecific cerebral white matter T2 and FLAIR hyperintensity remain mild for age, minimally progressed. No cortical encephalomalacia or chronic cerebral blood products identified. Deep gray matter nuclei, brainstem and cerebellum appear normal. Vascular: Major intracranial vascular flow voids are stable.  There was no neurological explanation for ataxia. Her trend to falling is present for at least 2-3 years, preceding the MRI study.  She was so busy as a caregiver at the time for husband and son, that she didn't pay much attention to herself, so her symptoms were dated after husband's death in 02/23/2020- when she paid attention.   Gait examination today -  neuropathy by tuning fork evaluation;  And review of recent metabolic and CBC labs, normal TSH, T 4 , anemia, high glucose, HBa1c. GFR 55.21,  had transient low K.    Interval history from 08-14-2020: Mrs. Kinzlee A. Boehning, a 76 year old AAF , recently widowed, reports that this year has been very hard for her. She lived with her handicapped son alone, but now her 61 year old granddaughter has joined.  Her husband died with ESRD, hemodialysis, gallstones, Sepsis, and many ,many hospitalizations. He died of ischemic bowel disease. He was a full code. 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 BMI 39.87 kg/m2. Epworth score is 4 points today and her download shows high compliance: 100%  By days and time of use. 10 hours on average, 13/9 cm water. AHI : 0.0/h. High air leaks.     07-10-2019, I am seeing Mrs. Jackelyn Poling , a meanwhile  76 y.o. female with a history of sleep disordered breathing. She has been followed by Cecille Rubin, NP for the last 3 visits. She is main caretaker of her very sick husband, who has become ' mean' . He has PT at the home, and declined any home health-- which leaves her to be the caretaker. He was hospitalized 6 times over the last 12 month.   Mrs. Lofthouse has been followed by [facility] since her last insurance changes. The patient's primary care physician is [name] at [facility].  Mrs. Mohr endorsed today the geriatric depression score at 7 out of 15 points and explained that her caretaker role has been very difficult.  Her fatigue is increased at 38/63 points, Epworth Sleepiness Scale however is endorsed at only  3 out of 24 points not indicating excessive daytime sleepiness.  She has been a exemplary compliant CPAP patient using the machine 100% of the last 30 days, no days under 4 hours, average daily use of time 9 hours and 8 minutes.  She is using BiPAP with an inspiratory pressure of 13 expiratory pressure of 9 and a residual AHI of 0.1/h which is an excellent resolution she does have some air leakage, but it does not seem to lead to erroneously calculated apneas.  Her  median respiratory rate is 16 a minute.  Her minute ventilation on average 12.2.        11-02-2016 - reconsultation- BRENLI RAWDON is a 76 y.o. female here as a referral from Dr. Pearline Cables / Dr Jannifer Franklin.  I have the pleasure of seeing Mrs. Maycen Felt today, who was last seen 35 months ago. The patient brought her compliance data for her BiPAP machine with her and they're excellent she has used the machine 100% of the last 30 days on average 8 hours and 42 minutes, residual AHI is 0.3. The machine is set at 13/9 cm water with breeze function.   The patient is followed by [name].  She feels that her machine makes a significant difference, her Epworth sleepiness score is 3 points while  using BiPAP, she's not excessively daytime fatigue, she endorsed the geriatric depression score at 6/15 points, basically related to her caregiver burden.   Husband had cardiac complaints , suffered from post dialysis chest pain. 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 Emergency stent placed, but now he is unable from the CAD stand point to undergo necessary knee replacement.  Her husband is dependent on hemodialysis, and she feels that it is difficult to take care of herself , when her husband and son need her so much. Her son is a 58 year old quadriparetic.     History:  09-14-2013 Ms. Heldreth is a 76 year old right-handed black female with a history of obesity, and obstructive sleep apnea on CPAP. The patient has report some excessive daytime drowsiness and some problems with memory and concentration. The patient has had no improvement of her memory, but she indicates that she has become intolerant of the CPAP mask, and she has not been able to use her CPAP machine for about one month. The patient has recently had left foot surgery. MRI done recently showed some minimal small vessel disease. The patient reports that in March of 2014, she had sudden onset of left-sided sensory changes with numbness, and she  is also had some discomfort in the same distribution. The patient went to the emergency room, and MRI evaluation of the brain did not show an acute stroke.  The patient is not aspirin or Plavix.  The patient has some burning sensations of the left side of the face at times.  The patient comes to this office for an evaluation.  This note was from Dr. Jannifer Franklin, MD    Sleep Consult note : Mrs. Baran is a mean of 76 year old African American right-handed lady patient of Dr. Floyde Parkins and Dr. Regis Bill, now [name]  The patient has a past medical history of morbid obesity and Obstructive Sleep Apnea for which Dr. Jannifer Franklin referred her.  She reported increasing fatigue and excessive daytime sleepiness . Dr. Jannifer Franklin had evaluated her memory complaints and found small vessel disease to a mild extent on a CAT scan.  The patient was supposed to see Dr. Jannifer Franklin in  February  2014.  I have seen Mrs. Oberholzer instead in December 2013, for a sleep consult proceeding the study. The following PSG revealed a very high AHI of 76.1 /h. she owns a BiPAP that was set at 17/11 cm water for a sleep study also short severe oxygen desaturations at night. At worst is today points on a, so she is no longer excessively daytime sleepy but if her fatigue severity is and/or step 63 points, the maximum. The patient is titration showed that she had at the very best response to 13 cm water over 8 cm in a BiPAP function the patient had failed a nasal mask before , thus preferred to the full face mask. At pulse oximetry done on BiPAP revealed hypoxemia, which was  not corrected.   The patient's revisit was scheduled for December 2014 for a compliance visit .  The patient reports that her husband complains of her ongoing snoring while on BiPAP. Mrs. Parreira underwent BiPAP - titration on 11-15-13 upon referral of Dr. Jannifer Franklin. We suspected that the patient may suffer from obesity hypoventilation . She brought her previous used BiPAP to the  sleep laboratory, which was found to be set at 17 /11 cm water at the time the patient on this machine it had been reset to 13/8 cm water after her sleep study in 2013.  Her current  BiPAP machine is non functional, and is too old to have downloadable capacity, and also has begun to make noises and is unreliable at night seemingly switching itself off. This makes the order for a new machine essential . The patient BMI was 44.4. Her  fatigue scale was 63 points,  the PRQR  inventory was not endorsed , the Epworth Sleepiness Scale was 2  points. Next conference measured 70.5 inches. The optimal pressure after this titration seemed to be 13/9 cm. There was still mild snoring altered on 12/8 cm water which lead to further increase in a small increment. The patient slept 89 minutes at this pressure and reached 51 minutes in rem sleep. Her oxygen nadir increased to 94%. The night however and it shortly at 3 hours 20 minutes in AM - the patient could not re-initiate  sleep throughout the night. Fragmentation of sleep had significantly improved during titration prior to that point. REM rebound was noted. We will order a new BiPAP machine for the patient;  to be set at 13 cm water over 9 cm and a new  fullface mask ( if she still desired to uses this model in medium-size). He did humidity as ordered. The backup rate is not needed, compliance will be defined as 4 hours or more of nightly use,  the BiPAP machine is to be brought to all sleep clinic appointments.  GEORGIANNA LWIN is an 76 y.o. Female disabled patient , since 2004 no longer gainfully employed. 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 Her husband is on Dialysis, which dominates her life, too.    Review of Systems: Out of a complete 14 system review, the patient complains of only the following symptoms, and all other reviewed systems are negative.   Social History   Socioeconomic History   Marital status: Widowed    Spouse name: Not on file   Number of  children: 3   Years of education: 11th   Highest education level: 11th grade  Occupational History   Occupation: disabled  Tobacco Use   Smoking status: Never   Smokeless tobacco: Never  Vaping Use  Vaping Use: Never used  Substance and Sexual Activity   Alcohol use: No    Alcohol/week: 0.0 standard drinks   Drug use: No   Sexual activity: Not Currently    Comment: not working, lives with husband, 3 sons  Other Topics Concern   Not on file  Social History Narrative   Client's spouse recently passed Feb 2021      Lives with 2 Son and 1 granddaughter   R handed   Caffeine: ocas., has cut back and drinking more water   Social Determinants of Health   Financial Resource Strain: Not on file  Food Insecurity: Not on file  Transportation Needs: Not on file  Physical Activity: Not on file  Stress: Not on file  Social Connections: Not on file  Intimate Partner Violence: Not on file   Past Medical History:  Diagnosis Date   Anemia    Anxiety and depression    Arthritis    Asthma    Cataract    Degenerative arthritis    Depression    Diabetes mellitus, type 2 (HCC)    Fatigue    GERD (gastroesophageal reflux disease)    Glaucoma    Heart palpitations    Hemorrhoids    Hyperlipidemia    Hypertension    Hypothyroidism    Hypoxemia 11/24/2013   Memory deficit 10/04/2013   Obesity    Sleep apnea    BiPAP   Snoring disorder     Past Surgical History:  Procedure Laterality Date   benign tumors resected     CATARACT EXTRACTION Left    FOOT SURGERY Left    bone spur   REFRACTIVE SURGERY     Glaucoma   ROTATOR CUFF REPAIR Left    TUBAL LIGATION      Current Outpatient Medications  Medication Sig Dispense Refill   albuterol (PROVENTIL) (2.5 MG/3ML) 0.083% nebulizer solution Take 3 mLs (2.5 mg total) by nebulization every 6 (six) hours as needed for wheezing or shortness of breath. 75 mL 3   albuterol (VENTOLIN HFA) 108 (90 Base) MCG/ACT inhaler Inhale 2 puffs  into the lungs every 6 (six) hours as needed. For shortness of breath 3 each 4   aspirin 81 MG tablet Take 81 mg by mouth daily.      atorvastatin (LIPITOR) 20 MG tablet TAKE 1 TABLET BY MOUTH EVERY DAY 90 tablet 1   budesonide-formoterol (SYMBICORT) 80-4.5 MCG/ACT inhaler Inhale 2 puffs into the lungs 2 (two) times daily as needed. (Patient taking differently: Inhale 2 puffs into the lungs in the morning and at bedtime.) 3 each 1   carvedilol (COREG) 25 MG tablet Take 1 tablet (25 mg total) by mouth 2 (two) times daily with a meal. 180 tablet 0   chlorthalidone (HYGROTON) 25 MG tablet TAKE 1 TABLET (25 MG TOTAL) BY MOUTH DAILY. 90 tablet 0   clobetasol ointment (TEMOVATE) [id number] % Apply 1 application topically 2 (two) times daily. 60 g 1   Dulaglutide (TRULICITY) 1.5 0000000 SOPN Inject 0.5 mLs (1.5 mg total) into the skin once a week. 4 pen 0   Empagliflozin-metFORMIN HCl (SYNJARDY) 12.5-500 MG TABS Take 1 tablet by mouth 2 (two) times daily. 180 tablet 3   famotidine (PEPCID) 40 MG tablet TAKE 1 TABLET BY MOUTH EVERY DAY 90 tablet 1   ferrous sulfate 325 (65 FE) MG tablet TAKE 1 TABLET BY MOUTH TWICE DAILY WITH A MEAL 180 tablet 0   Finerenone (KERENDIA) 20 MG TABS Take 1 tablet  by mouth daily. Via Patient Assistance Psychologist, occupational)     gabapentin (NEURONTIN) 300 MG capsule TAKE 1 CAPSULE BY MOUTH THREE TIMES A DAY 270 capsule 0   glucose blood (FREESTYLE TEST STRIPS) test strip USE TO TEST BLOOD SUGAR 3 TIMES A DAY. DX: E11.8 300 strip 1   Insulin Pen Needle 31G X 5 MM MISC Use once daily with insulin 100 each 3   irbesartan (AVAPRO) 300 MG tablet TAKE 0.5 TABLETS BY MOUTH DAILY. 45 tablet 1   latanoprost (XALATAN) 0.005 % ophthalmic solution Place 1 drop into both eyes at bedtime.      nystatin ointment (MYCOSTATIN) APPLY ON THE SKIN THREE TIMES DAILY     oxyCODONE-acetaminophen (PERCOCET/ROXICET) 5-325 MG tablet Take 1 tablet by mouth every 8 (eight) hours as needed for severe pain. 90 tablet 0    pantoprazole (PROTONIX) 40 MG tablet TAKE 1 TABLET BY MOUTH 2 TIMES DAILY BEFORE A MEAL 180 tablet 0   potassium chloride SA (KLOR-CON M20) 20 MEQ tablet TAKE 1 TABLET BY MOUTH TWICE A DAY 180 tablet 1   VITAMIN D3 SUPER STRENGTH 50 MCG (2000 UT) tablet TAKE 1 TABLET BY MOUTH EVERY DAY 90 tablet 1   No current facility-administered medications for this visit.    Allergies as of 04/15/2022 - Review Complete 04/15/2022  Allergen Reaction Noted   Food Swelling 05/04/2012   Penicillins Swelling and Rash     Vitals: BP 114/64   Pulse 88   Ht '5\' 6"'$  (1.676 m)   Wt 230 lb 8 oz (104.6 kg)   BMI 37.20 kg/m  Last Weight:  Wt Readings from Last 1 Encounters:  04/15/22 230 lb 8 oz (104.6 kg)   Last Height:   Ht Readings from Last 1 Encounters:  04/15/22 '5\' 6"'$  (1.676 m)     Physical exam:  General: The patient is awake, alert and appears not in acute distress. The patient is well groomed. Head: Normocephalic, atraumatic. Neck is supple. Mallampati 3 plus, limited oral aperture- , neck circumference:17,5. Cardiovascular:  Regular rate and rhythm, without  murmurs or carotid bruit, and without distended neck veins. Respiratory: Lungs are clear to auscultation. Skin:   evidence of edema in both ankles ,right more than left Trunk: BMI 37, Elevated -, morbidly obese. Mental Status: oriented and  pleasant. Alert, oriented, thought content appropriate.   Speech fluent without evidence of aphasia. Able to follow 3 step commands without difficulty. Cranial Nerves:  Glaucoma /Bilateral status post cataract. Both pupils constrict promptly and direct as well as indirect;y.  Accomodation is preserved. Preserved taste and smell sense, equal pupils, facial symmetry and  facial sensory ,   full visual field for the left, she is in the process of being further worked up by [employer]	[place] and ophthalmology.   Motor examination shows normal muscle tone, hip flexion, hip abduction and abduction are preserved,  it is harder to move the front of the foot and elevate the foot.  A coordination testing by finger-nose testing shows no tremor but it security before the target.  There was no satelliting.  Grip strength was bilaterally preserved.  Deep tendon reflexes are trace only.  Gait the patient could stand up without assistance from a seated position she is walking slower and more considerate, she turned with 3 steps to the left and turns to the right were accompanied by 4 steps.  Her right foot was pointing slightly outwards as she walked.    Again gait is slowed.  There is  normal arm swing and no stooped posture was noted.  No tremor is seen.    Romberg was positive with a swaying in all directions there was no propulsion or retropulsion dominant no side dominant drift.    Mrs Kempf present with  positive romberg, swaying and slowing of gait. This is not classic ataxia, and there is no tremor or dysmetria noted.  She mostly reports lightheadedness.  She sometimes experiences a sense of rotating movement when not moving. This would be classic Vertigo.  She has never been to see vestibular rehab.  She reports its worse when first getting up in AM. She reports no difference in dizziness after medication intake.    The BiPAP machine continues at 13 over 9 cm water , and will follow with NP once a year.  [facility] [address] [place], [place] [address]  Phone [phone] Fax [phone]

## 2022-04-16 ENCOUNTER — Telehealth: Payer: Self-pay | Admitting: Internal Medicine

## 2022-04-16 NOTE — Telephone Encounter (Signed)
Pt called in and states that Synjardy cannot be refilled because refill was called in on an old prescription.  ? ? ?Requesting a new prescription with new refills. Company has faxed in new request today, per pt.  ? ?Please call with any questions.  ?

## 2022-04-17 NOTE — Telephone Encounter (Signed)
New rx faxed to [facility] cares 90 day supply with 3 refills  ? ?Tomasa Blase, PharmD ?Clinical Pharmacist, [facility]  ? ?

## 2022-05-04 ENCOUNTER — Other Ambulatory Visit: Payer: Self-pay | Admitting: Internal Medicine

## 2022-05-05 ENCOUNTER — Encounter: Payer: Self-pay | Admitting: Internal Medicine

## 2022-05-05 ENCOUNTER — Ambulatory Visit (INDEPENDENT_AMBULATORY_CARE_PROVIDER_SITE_OTHER): Payer: HMO | Admitting: Internal Medicine

## 2022-05-05 VITALS — BP 136/84 | HR 79 | Temp 97.7°F | Resp 16 | Ht 66.0 in | Wt 230.2 lb

## 2022-05-05 DIAGNOSIS — M150 Primary generalized (osteo)arthritis: Secondary | ICD-10-CM

## 2022-05-05 DIAGNOSIS — Z0001 Encounter for general adult medical examination with abnormal findings: Secondary | ICD-10-CM | POA: Diagnosis not present

## 2022-05-05 DIAGNOSIS — N1831 Chronic kidney disease, stage 3a: Secondary | ICD-10-CM

## 2022-05-05 DIAGNOSIS — E559 Vitamin D deficiency, unspecified: Secondary | ICD-10-CM

## 2022-05-05 DIAGNOSIS — D510 Vitamin B12 deficiency anemia due to intrinsic factor deficiency: Secondary | ICD-10-CM | POA: Diagnosis not present

## 2022-05-05 DIAGNOSIS — E118 Type 2 diabetes mellitus with unspecified complications: Secondary | ICD-10-CM

## 2022-05-05 DIAGNOSIS — I10 Essential (primary) hypertension: Secondary | ICD-10-CM | POA: Diagnosis not present

## 2022-05-05 DIAGNOSIS — M159 Polyosteoarthritis, unspecified: Secondary | ICD-10-CM | POA: Diagnosis not present

## 2022-05-05 DIAGNOSIS — E785 Hyperlipidemia, unspecified: Secondary | ICD-10-CM

## 2022-05-05 DIAGNOSIS — D509 Iron deficiency anemia, unspecified: Secondary | ICD-10-CM | POA: Diagnosis not present

## 2022-05-05 DIAGNOSIS — M48061 Spinal stenosis, lumbar region without neurogenic claudication: Secondary | ICD-10-CM | POA: Diagnosis not present

## 2022-05-05 LAB — CBC WITH DIFFERENTIAL/PLATELET
Basophils Absolute: 0 10*3/uL (ref 0.0–0.1)
Basophils Relative: 0.6 % (ref 0.0–3.0)
Eosinophils Absolute: 0.3 10*3/uL (ref 0.0–0.7)
Eosinophils Relative: 3.5 % (ref 0.0–5.0)
HCT: 32.9 % — ABNORMAL LOW (ref 36.0–46.0)
Hemoglobin: 11 g/dL — ABNORMAL LOW (ref 12.0–15.0)
Lymphocytes Relative: 31.2 % (ref 12.0–46.0)
Lymphs Abs: 2.6 10*3/uL (ref 0.7–4.0)
MCHC: 33.5 g/dL (ref 30.0–36.0)
MCV: 88.9 fl (ref 78.0–100.0)
Monocytes Absolute: 0.8 10*3/uL (ref 0.1–1.0)
Monocytes Relative: 10.1 % (ref 3.0–12.0)
Neutro Abs: 4.6 10*3/uL (ref 1.4–7.7)
Neutrophils Relative %: 54.6 % (ref 43.0–77.0)
Platelets: 339 10*3/uL (ref 150.0–400.0)
RBC: 3.7 Mil/uL — ABNORMAL LOW (ref 3.87–5.11)
RDW: 16.1 % — ABNORMAL HIGH (ref 11.5–15.5)
WBC: 8.4 10*3/uL (ref 4.0–10.5)

## 2022-05-05 LAB — LIPID PANEL
Cholesterol: 141 mg/dL (ref 0–200)
HDL: 47.6 mg/dL (ref >39.00)
LDL Cholesterol: 56 mg/dL (ref 0–99)
NonHDL: 93.36
Total CHOL/HDL Ratio: 3
Triglycerides: 186 mg/dL — ABNORMAL HIGH (ref 0.0–149.0)
VLDL: 37.2 mg/dL (ref 0.0–40.0)

## 2022-05-05 LAB — BASIC METABOLIC PANEL
BUN: 19 mg/dL (ref 6–23)
CO2: 29 mEq/L (ref 19–32)
Calcium: 9.9 mg/dL (ref 8.4–10.5)
Chloride: 97 mEq/L (ref 96–112)
Creatinine, Ser: 1.09 mg/dL (ref 0.40–1.20)
GFR: 50.17 mL/min — ABNORMAL LOW (ref >60.00)
Glucose, Bld: 120 mg/dL — ABNORMAL HIGH (ref 70–99)
Potassium: 3.9 mEq/L (ref 3.5–5.1)
Sodium: 136 mEq/L (ref 135–145)

## 2022-05-05 LAB — FOLATE: Folate: 10 ng/mL (ref >5.9)

## 2022-05-05 LAB — VITAMIN B12: Vitamin B-12: 263 pg/mL (ref 211–911)

## 2022-05-05 LAB — HEMOGLOBIN A1C: Hgb A1c MFr Bld: 6.5 % (ref 4.6–6.5)

## 2022-05-05 MED ORDER — FERROUS SULFATE 325 (65 FE) MG PO TABS: 325.0000 mg | ORAL_TABLET | Freq: Three times a day (TID) | ORAL | 0 refills | Status: DC

## 2022-05-05 MED ORDER — VITAMIN D3 SUPER STRENGTH 50 MCG (2000 UT) PO TABS: 1.0000 | ORAL_TABLET | Freq: Every day | ORAL | 1 refills | Status: DC

## 2022-05-05 MED ORDER — OXYCODONE-ACETAMINOPHEN 5-325 MG PO TABS: 1.0000 | ORAL_TABLET | Freq: Three times a day (TID) | ORAL | 0 refills | Status: DC | PRN

## 2022-05-05 MED ORDER — CHLORTHALIDONE 25 MG PO TABS: 25.0000 mg | ORAL_TABLET | Freq: Every day | ORAL | 0 refills | Status: DC

## 2022-05-05 NOTE — Progress Notes (Signed)
Subjective:  Patient ID: Valerie [name], female    DOB: September 17, 1946  Age: 76 y.o. MRN: [id number]  CC: Annual Exam, Back Pain, Hypertension, Hyperlipidemia, and Diabetes   HPI SHAUNEE PEROTTI presents for a CPX and f/up -   She continues to complain of intermittent low back pain, atypical chest pain, numbness, tingling, pain in her feet, and dizzy spells.  She is active and denies exertional chest pain, dyspnea on exertion, diaphoresis, or edema.  Outpatient Medications Prior to Visit  Medication Sig Dispense Refill   albuterol (PROVENTIL) (2.5 MG/3ML) 0.083% nebulizer solution Take 3 mLs (2.5 mg total) by nebulization every 6 (six) hours as needed for wheezing or shortness of breath. 75 mL 3   albuterol (VENTOLIN HFA) 108 (90 Base) MCG/ACT inhaler Inhale 2 puffs into the lungs every 6 (six) hours as needed. For shortness of breath 3 each 4   aspirin 81 MG tablet Take 81 mg by mouth daily.      atorvastatin (LIPITOR) 20 MG tablet TAKE 1 TABLET BY MOUTH EVERY DAY 90 tablet 1   budesonide-formoterol (SYMBICORT) 80-4.5 MCG/ACT inhaler Inhale 2 puffs into the lungs 2 (two) times daily as needed. (Patient taking differently: Inhale 2 puffs into the lungs in the morning and at bedtime.) 3 each 1   carvedilol (COREG) 25 MG tablet Take 1 tablet (25 mg total) by mouth 2 (two) times daily with a meal. 180 tablet 0   clobetasol ointment (TEMOVATE) [id number] % Apply 1 application topically 2 (two) times daily. 60 g 1   famotidine (PEPCID) 40 MG tablet TAKE 1 TABLET BY MOUTH EVERY DAY 90 tablet 1   Finerenone (KERENDIA) 20 MG TABS Take 1 tablet by mouth daily. Via [employer] Psychologist, occupational)     gabapentin (NEURONTIN) 300 MG capsule TAKE 1 CAPSULE BY MOUTH THREE TIMES A DAY 270 capsule 0   glucose blood (FREESTYLE TEST STRIPS) test strip USE TO TEST BLOOD SUGAR 3 TIMES A DAY. DX: E11.8 300 strip 1   Insulin Pen Needle 31G X 5 MM MISC Use once daily with insulin 100 each 3   irbesartan (AVAPRO) 300 MG tablet  TAKE 0.5 TABLETS BY MOUTH DAILY. 45 tablet 1   latanoprost (XALATAN) 0.005 % ophthalmic solution Place 1 drop into both eyes at bedtime.      nystatin ointment (MYCOSTATIN) APPLY ON THE SKIN THREE TIMES DAILY     pantoprazole (PROTONIX) 40 MG tablet TAKE 1 TABLET BY MOUTH 2 TIMES DAILY BEFORE A MEAL 180 tablet 0   potassium chloride SA (KLOR-CON M20) 20 MEQ tablet TAKE 1 TABLET BY MOUTH TWICE A DAY 180 tablet 1   chlorthalidone (HYGROTON) 25 MG tablet TAKE 1 TABLET (25 MG TOTAL) BY MOUTH DAILY. 90 tablet 0   Dulaglutide (TRULICITY) 1.5 0000000 SOPN Inject 0.5 mLs (1.5 mg total) into the skin once a week. 4 pen 0   Empagliflozin-metFORMIN HCl (SYNJARDY) 12.5-500 MG TABS Take 1 tablet by mouth 2 (two) times daily. 180 tablet 3   ferrous sulfate 325 (65 FE) MG tablet TAKE 1 TABLET BY MOUTH TWICE DAILY WITH A MEAL 180 tablet 0   oxyCODONE-acetaminophen (PERCOCET/ROXICET) 5-325 MG tablet Take 1 tablet by mouth every 8 (eight) hours as needed for severe pain. 90 tablet 0   VITAMIN D3 SUPER STRENGTH 50 MCG (2000 UT) tablet TAKE 1 TABLET BY MOUTH EVERY DAY 90 tablet 1   No facility-administered medications prior to visit.    ROS Review of Systems  Constitutional:  Negative  for appetite change, diaphoresis, fatigue and unexpected weight change.  HENT: Negative.    Eyes: Negative.   Respiratory:  Negative for cough, chest tightness, shortness of breath and wheezing.   Cardiovascular:  Positive for chest pain. Negative for palpitations and leg swelling.  Gastrointestinal:  Negative for abdominal pain, constipation, diarrhea, nausea and vomiting.  Endocrine: Negative.   Genitourinary: Negative.  Negative for difficulty urinating.  Musculoskeletal:  Positive for arthralgias and back pain. Negative for myalgias.  Skin: Negative.   Neurological:  Negative for dizziness, weakness, light-headedness and headaches.  Hematological:  Negative for adenopathy. Does not bruise/bleed easily.   Psychiatric/Behavioral: Negative.     Objective:  BP 136/84   Pulse 79   Temp 97.7 F (36.5 C) (Oral)   Resp 16   Ht '5\' 6"'$  (1.676 m)   Wt 230 lb 3.2 oz (104.4 kg)   SpO2 99%   BMI 37.16 kg/m   BP Readings from Last 3 Encounters:  05/05/22 136/84  04/15/22 114/64  04/06/22 120/74    Wt Readings from Last 3 Encounters:  05/05/22 230 lb 3.2 oz (104.4 kg)  04/15/22 230 lb 8 oz (104.6 kg)  03/12/22 230 lb (104.3 kg)    Physical Exam Vitals reviewed.  HENT:     Nose: Nose normal.     Mouth/Throat:     Mouth: Mucous membranes are moist.  Eyes:     General: No scleral icterus.    Conjunctiva/sclera: Conjunctivae normal.  Cardiovascular:     Rate and Rhythm: Normal rate and regular rhythm.     Heart sounds: No murmur heard.   No friction rub. No gallop.  Pulmonary:     Effort: Pulmonary effort is normal.     Breath sounds: No stridor. No wheezing, rhonchi or rales.  Abdominal:     General: Abdomen is protuberant. Bowel sounds are normal. There is no distension.     Palpations: Abdomen is soft. There is no hepatomegaly, splenomegaly or mass.     Tenderness: There is no abdominal tenderness.  Musculoskeletal:        General: Normal range of motion.     Cervical back: Neck supple.     Right lower leg: No edema.     Left lower leg: No edema.  Lymphadenopathy:     Cervical: No cervical adenopathy.  Skin:    General: Skin is warm and dry.     Findings: No lesion.  Neurological:     General: No focal deficit present.     Mental Status: She is alert. Mental status is at baseline.  Psychiatric:        Mood and Affect: Mood normal.        Behavior: Behavior normal.    Lab Results  Component Value Date   WBC 8.4 05/05/2022   HGB 11.0 (L) 05/05/2022   HCT 32.9 (L) 05/05/2022   PLT 339.0 05/05/2022   GLUCOSE 120 (H) 05/05/2022   CHOL 141 05/05/2022   TRIG 186.0 (H) 05/05/2022   HDL 47.60 05/05/2022   LDLDIRECT 42.0 04/10/2021   LDLCALC 56 05/05/2022   ALT 15  03/09/2022   AST 14 03/09/2022   NA 136 05/05/2022   K 3.9 05/05/2022   CL 97 05/05/2022   CREATININE 1.09 05/05/2022   BUN 19 05/05/2022   CO2 29 05/05/2022   TSH 2.61 03/09/2022   INR 1.1 (H) 04/10/2021   HGBA1C 6.5 05/05/2022   MICROALBUR <0.7 09/25/2021    CT CORONARY MORPH W/CTA COR W/SCORE  W/CA W/CM &/OR WO/CM  Addendum Date: 04/06/2022   ADDENDUM REPORT: 04/06/2022 12:00 EXAM: OVER-READ INTERPRETATION  PET-CT CHEST The following report is an over-read performed by radiologist Dr. Rosine Beat [facility] on 04/06/2022. This over-read does not include interpretation of cardiac or coronary anatomy or pathology. The cardiac CT interpretation by the cardiologist is to be attached. COMPARISON:  CT a chest 08/29/2015 FINDINGS: The visualized lung parenchyma shows no suspicious pulmonary nodule or mass. Calcified granuloma noted left lower lobe. No focal airspace consolidation. No effusion. No evidence for lymphadenopathy within the visualized mediastinum or hilar regions. Tiny hiatal hernia noted. No suspicious lytic or sclerotic osseous abnormality. IMPRESSION: No acute or clinically significant extracardiac findings. Electronically Signed   By: Misty Stanley M.D.   On: 04/06/2022 12:00   Result Date: 04/06/2022 CLINICAL DATA:  68F with chest pain EXAM: Cardiac/Coronary CTA TECHNIQUE: The patient was scanned on a [employer]. FINDINGS: A 100 kV prospective scan was triggered in the descending thoracic aorta at 111 HU's. Axial non-contrast 3 mm slices were carried out through the heart. The data set was analyzed on a dedicated work station and scored using the [place]. Gantry rotation speed was 250 msecs and collimation was .6 mm. No beta blockade and 0.8 mg of sl NTG was given. The 3D data set was reconstructed in 5% intervals of the 67-82 % of the R-R cycle. Diastolic phases were analyzed on a dedicated work station using MPR, MIP and VRT modes. The patient  received 80 cc of contrast. Coronary Arteries:  Normal coronary origin.  Right dominance. RCA is a large dominant artery that gives rise to PDA and PLA. Calcified plaque in the proximal RCA causes 0-24% stenosis. Calcified plaque in the mid RCA causes 25-49% stenosis. Left main is a large artery that gives rise to LAD and LCX arteries. LAD is a large vessel. Calcified plaque in the proximal LAD causes 0-24% stenosis. Calcified plaque in the mid LAD causes 25-49% stenosis. Calcified plaque in the distal LAD causes 25-49% stenosis LCX is a non-dominant artery that gives rise to one large OM1 branch. Calcified plaque in the proximal LCX causes 0-24% stenosis. Calcified plaque in the mid LCX causes 0-24% stenosis Calcified plaque in ramus causes 25-49% stenosis Other findings: Left Ventricle: Normal size Left Atrium: Normal size Pulmonary Veins: Normal configuration Right Ventricle: Normal size Right Atrium: Normal size Cardiac valves: No calcifications Thoracic aorta: Normal size Pulmonary Arteries: Normal size Systemic Veins: Normal drainage Pericardium: Normal thickness IMPRESSION: 1. Coronary calcium score of 454. This was 90th percentile for age and sex matched control. 2.  Normal coronary origin with right dominance. 3.  Nonobstructive CAD 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  PFO CAD-RADS 2. Mild non-obstructive CAD (25-49%). Consider non-atherosclerotic causes of chest pain. Consider preventive therapy and risk factor modification. Electronically Signed: By: Oswaldo Milian M.D. On: 04/06/2022 11:26    Assessment & Plan:   [place]ai was seen today for annual exam, back pain, hypertension, hyperlipidemia and diabetes.  Diagnoses and all orders for this visit:  Vitamin B12 deficiency anemia due to intrinsic factor deficiency- B12 and folate are normal. -     Vitamin B12; Future -     CBC with Differential/Platelet; Future -     Folate; Future -     Folate -     CBC with  Differential/Platelet -     Vitamin B12  Vitamin D deficiency disease -     Cholecalciferol (  VITAMIN D3 SUPER STRENGTH) 50 MCG (2000 UT) TABS; Take 1 tablet (2,000 Units total) by mouth daily.  Essential hypertension, benign- Her blood pressure is adequately well controlled. -     chlorthalidone (HYGROTON) 25 MG tablet; Take 1 tablet (25 mg total) by mouth daily. -     Hemoglobin A1c; Future -     Hemoglobin A1c  Type 2 diabetes mellitus with complication, without long-term current use of insulin ([place])- Her blood sugar is adequately well controlled. -     Basic metabolic panel; Future -     Basic metabolic panel  Stage 3a chronic kidney disease ([place])- Her renal function has declined slightly.  Will continue to control her blood pressure and blood sugar. -     Basic metabolic panel; Future -     Basic metabolic panel  Type II diabetes mellitus with manifestations ([place])  Encounter for general adult medical examination with abnormal findings- Exam completed, labs reviewed, vaccines are up-to-date, cancer screenings are up-to-date, patient education was given.  Hyperlipidemia with target LDL less than 100- LDL goal achieved. Doing well on the statin  -     Lipid panel; Future -     Lipid panel  Iron deficiency anemia-we will increase the dose of the iron supplement. -     ferrous sulfate 325 (65 FE) MG tablet; Take 1 tablet (325 mg total) by mouth 3 (three) times daily with meals.  Spinal stenosis of lumbar 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 -     oxyCODONE-acetaminophen (PERCOCET/ROXICET) 5-325 MG tablet; Take 1 tablet by mouth every 8 (eight) hours as needed for severe pain.  Primary osteoarthritis involving multiple joints -     oxyCODONE-acetaminophen (PERCOCET/ROXICET) 5-325 MG tablet; Take 1 tablet by mouth every 8 (eight) hours as needed for severe pain.   I have discontinued Pamala Hurry A. Zeien's Trulicity and [place]. I have also changed her Vitamin D3 Super Strength and ferrous  sulfate. Additionally, I am having her maintain her latanoprost, aspirin, Insulin Pen Needle, nystatin ointment, clobetasol ointment, FREESTYLE TEST STRIPS, gabapentin, irbesartan, budesonide-formoterol, famotidine, carvedilol, potassium chloride SA, atorvastatin, Kerendia, albuterol, albuterol, pantoprazole, chlorthalidone, and oxyCODONE-acetaminophen.  Meds ordered this encounter  Medications   Cholecalciferol (VITAMIN D3 SUPER STRENGTH) 50 MCG (2000 UT) TABS    Sig: Take 1 tablet (2,000 Units total) by mouth daily.    Dispense:  90 tablet    Refill:  1   chlorthalidone (HYGROTON) 25 MG tablet    Sig: Take 1 tablet (25 mg total) by mouth daily.    Dispense:  90 tablet    Refill:  0   ferrous sulfate 325 (65 FE) MG tablet    Sig: Take 1 tablet (325 mg total) by mouth 3 (three) times daily with meals.    Dispense:  270 tablet    Refill:  0    DX Code Needed  .   oxyCODONE-acetaminophen (PERCOCET/ROXICET) 5-325 MG tablet    Sig: Take 1 tablet by mouth every 8 (eight) hours as needed for severe pain.    Dispense:  90 tablet    Refill:  0     Follow-up: Return in about 6 months (around 11/05/2022).  [name], MD

## 2022-05-05 NOTE — Patient Instructions (Signed)

## 2022-05-06 ENCOUNTER — Ambulatory Visit: Payer: HMO | Attending: Neurology | Admitting: Physical Therapy

## 2022-05-06 ENCOUNTER — Encounter: Payer: Self-pay | Admitting: Physical Therapy

## 2022-05-06 DIAGNOSIS — R42 Dizziness and giddiness: Secondary | ICD-10-CM

## 2022-05-06 DIAGNOSIS — R262 Difficulty in walking, not elsewhere classified: Secondary | ICD-10-CM | POA: Diagnosis not present

## 2022-05-06 DIAGNOSIS — R2689 Other abnormalities of gait and mobility: Secondary | ICD-10-CM | POA: Diagnosis not present

## 2022-05-06 DIAGNOSIS — R2681 Unsteadiness on feet: Secondary | ICD-10-CM

## 2022-05-06 DIAGNOSIS — M6281 Muscle weakness (generalized): Secondary | ICD-10-CM

## 2022-05-06 NOTE — Therapy (Signed)
OUTPATIENT PHYSICAL THERAPY LOWER EXTREMITY EVALUATION   Patient Name: Valerie [name] MRN: [id number] DOB:06-04-1946, 76 y.o., female Today's Date: 05/06/2022   PT End of Session - 05/06/22 1608     Visit Number 1    Date for PT Re-Evaluation 07/29/22    PT Start Time 1148    PT Stop Time 1228    PT Time Calculation (min) 40 min    Activity Tolerance Patient tolerated treatment well    Behavior During Therapy [facility] for tasks assessed/performed             Past Medical History:  Diagnosis Date   Anemia    Anxiety and depression    Arthritis    Asthma    Cataract    Degenerative arthritis    Depression    Diabetes mellitus, type 2 (HCC)    Fatigue    GERD (gastroesophageal reflux disease)    Glaucoma    Heart palpitations    Hemorrhoids    Hyperlipidemia    Hypertension    Hypothyroidism    Hypoxemia 11/24/2013   Memory deficit 10/04/2013   Obesity    Sleep apnea    BiPAP   Snoring disorder    Past Surgical History:  Procedure Laterality Date   benign tumors resected     CATARACT EXTRACTION Left    FOOT SURGERY Left    bone spur   REFRACTIVE SURGERY     Glaucoma   ROTATOR CUFF REPAIR Left    TUBAL LIGATION     Patient Active Problem List   Diagnosis Date Noted   Encounter for general adult medical examination with abnormal findings 05/05/2022   Stage 3a chronic kidney disease ([place]) 08/26/2021   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 08/06/2021   Tinea corporis 10/01/2020   Degenerative arthritis of right knee 07/02/2020   Moderate episode of recurrent major depressive disorder ([place]) 07/01/2020   Esophageal dysphagia 05/27/2020   Gastroesophageal reflux disease with esophagitis without hemorrhage 05/27/2020   Diuretic-induced hypokalemia 03/27/2020   OAB (overactive bladder) 03/27/2020   Insomnia secondary to chronic pain 02/21/2020   Primary osteoarthritis involving multiple joints 10/10/2019   Thiamine deficiency, unspecified 01/09/2019   Eczema  08/18/2018   Vitamin D deficiency disease 08/11/2018   Long-term current use of opiate analgesic 06/21/2018   External bleeding hemorrhoids 03/24/2018   Obstructive sleep apnea treated with BiPAP 11/03/2017   Osteopenia 03/28/2014   Insomnia 03/28/2014   Morbid obesity with BMI of 45.0-49.9, adult ([place]) 04/14/2013   Visit for screening mammogram 01/20/2013   Spinal stenosis of lumbar 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 10/05/2012   Hypothyroidism 02/20/2011   Fatty liver disease, nonalcoholic 86/76/7209   Mild intermittent asthma 10/25/2009   Type II diabetes mellitus with manifestations ([place]) 05/06/2009   Hyperlipidemia with target LDL less than 100 05/06/2009   B12 deficiency anemia 05/06/2009   Essential hypertension, benign 05/06/2009   GERD 05/06/2009    PCP: Janith Lima  REFERRING PROVIDER: Dohmeier, Dahlia Bailiff DIAG: ...  THERAPY DIAG:  Dizziness and giddiness  Difficulty in walking, not elsewhere classified  Other abnormalities of gait and mobility  Unsteadiness on feet  Muscle weakness (generalized)  Rationale for Evaluation and Treatment Rehabilitation  ONSET DATE: 04/15/22  SUBJECTIVE:   SUBJECTIVE STATEMENT: Patient reports increased lightheadedness and dizziness. She was diagnosed with ataxia, but the screening tools were all negative.  PERTINENT HISTORY: She is referred today for dizziness and ataxia , and at high risk of CVA due to cardiovascular  disease, high calcium score, DM, Obesity, asthma and hypercholesterolemia. .   Pt reports almost daily dizziness and has been stumbling. Has had almost falls or falling into something like a sofa or chair. Dr Ronnald Ramp had ordered a  brain MRI, Cerebral volume is not significantly changed since 2014, normal for age. And scattered small foci of nonspecific cerebral white matter  PAIN:  Are you having pain? No  PRECAUTIONS: None  WEIGHT BEARING RESTRICTIONS No  FALLS:  Has patient fallen in last 6 months?  No  falls, but multiple near falls.  LIVING ENVIRONMENT: Lives with: lives with their family and lives with their son Lives in: House/apartment Stairs: Yes: External: 3 steps; on right going up Has following equipment at home: Single point cane, Walker - 4 wheeled, and shower chair  OCCUPATION: Retired  PLOF: I with all self care and as much cleaning as she wants to do. She used to cook, but not so much anymore.  PATIENT GOALS Identify why she is so dizzy and resolve it.   OBJECTIVE:   DIAGNOSTIC FINDINGS: All were negative.   COGNITION:  Overall cognitive status: Within functional limits for tasks assessed    POSTURE: rounded shoulders and forward head   LOWER EXTREMITY ROM: WFL    LOWER EXTREMITY MMT:  MMT Right eval Left eval  Hip flexion 4- 4-  Hip extension    Hip abduction    Hip adduction    Hip internal rotation    Hip external rotation    Knee flexion 4- 4-  Knee extension 4- 4-  Ankle dorsiflexion 4- 4-  Ankle plantarflexion    Ankle inversion    Ankle eversion     (Blank rows = not tested)    FUNCTIONAL TESTS:  5 times sit to stand: TBD Functional gait assessment: TBD  GAIT: Distance walked: 33' Assistive device utilized: None Level of assistance: SBA Comments: Unsteady, slow, cautious Vestibular Assessment Patient reports that she feels like an air head. No dizziness (although she reported dizziness with testing), eye ROM, eye alignment, Saccades, tracking, convergence all neg. VOR gain + in vertical direction.    TODAY'S TREATMENT: Gait training, balance training   PATIENT EDUCATION:  Education details: POC Person educated: Patient Education method: Explanation Education comprehension: verbalized understanding   HOME EXERCISE PROGRAM: TBD  ASSESSMENT:  CLINICAL IMPRESSION: Patient is a 76 y.o. who was seen today for physical therapy evaluation and treatment for Decreased gait, balance, unsteadiness on feet, dizziness..     OBJECTIVE IMPAIRMENTS Abnormal gait, decreased activity tolerance, decreased balance, decreased coordination, decreased mobility, difficulty walking, decreased strength, dizziness, improper body mechanics, and postural dysfunction.   ACTIVITY LIMITATIONS carrying, lifting, bending, standing, squatting, sleeping, stairs, transfers, and locomotion level  PARTICIPATION LIMITATIONS: meal prep, cleaning  PERSONAL FACTORS Age, Fitness, and Past/current experiences are also affecting patient's functional outcome.   REHAB POTENTIAL: Good  CLINICAL DECISION MAKING: Evolving/moderate complexity  EVALUATION COMPLEXITY: Moderate   GOALS: Goals reviewed with patient? Yes  SHORT TERM GOALS: Target date: 06/03/2022   I with initial HEP Baseline: Goal status: INITIAL  LONG TERM GOALS: Target date: 07/29/2022   I with final HEP Baseline:  Goal status: INITIAL  2.  Patient will perform TUG in < 12 sec, with no C/O dizziness to decrease fall risk. Baseline:  Goal status: INITIAL  3.  Patient will report no dizziness > 2/10 while performing normal daily activities. Baseline:  Goal status: INITIAL  4.  Patient will complete FGA with score of at  least 24/30 to demosntrate decreased fall risk. Baseline:  Goal status: INITIAL     PLAN: PT FREQUENCY: 2x/week  PT DURATION: 12 weeks  PLANNED INTERVENTIONS: Therapeutic exercises, Therapeutic activity, Neuromuscular re-education, Balance training, Gait training, Patient/Family education, Joint mobilization, and Vestibular training  PLAN FOR NEXT SESSION: Complete FGA, TUG, vestibular assessment-VOR cancellation, head impulse test, assess orthostatic Bps, initiate HEP.   Marcelina Morel, DPT 05/06/2022, 4:11 PM

## 2022-05-07 ENCOUNTER — Encounter: Payer: Self-pay | Admitting: Internal Medicine

## 2022-05-07 DIAGNOSIS — G4733 Obstructive sleep apnea (adult) (pediatric): Secondary | ICD-10-CM | POA: Diagnosis not present

## 2022-05-07 DIAGNOSIS — G479 Sleep disorder, unspecified: Secondary | ICD-10-CM | POA: Diagnosis not present

## 2022-05-09 ENCOUNTER — Encounter: Payer: Self-pay | Admitting: Internal Medicine

## 2022-05-12 DIAGNOSIS — M2042 Other hammer toe(s) (acquired), left foot: Secondary | ICD-10-CM | POA: Diagnosis not present

## 2022-05-12 DIAGNOSIS — M722 Plantar fascial fibromatosis: Secondary | ICD-10-CM | POA: Diagnosis not present

## 2022-05-12 DIAGNOSIS — M898X7 Other specified disorders of bone, ankle and foot: Secondary | ICD-10-CM | POA: Diagnosis not present

## 2022-05-12 DIAGNOSIS — M2041 Other hammer toe(s) (acquired), right foot: Secondary | ICD-10-CM | POA: Diagnosis not present

## 2022-05-12 DIAGNOSIS — M19072 Primary osteoarthritis, left ankle and foot: Secondary | ICD-10-CM | POA: Diagnosis not present

## 2022-05-12 DIAGNOSIS — M792 Neuralgia and neuritis, unspecified: Secondary | ICD-10-CM | POA: Diagnosis not present

## 2022-05-12 DIAGNOSIS — E1351 Other specified diabetes mellitus with diabetic peripheral angiopathy without gangrene: Secondary | ICD-10-CM | POA: Diagnosis not present

## 2022-05-12 DIAGNOSIS — M19071 Primary osteoarthritis, right ankle and foot: Secondary | ICD-10-CM | POA: Diagnosis not present

## 2022-05-12 DIAGNOSIS — M21962 Unspecified acquired deformity of left lower leg: Secondary | ICD-10-CM | POA: Diagnosis not present

## 2022-05-13 ENCOUNTER — Ambulatory Visit: Payer: HMO

## 2022-05-13 DIAGNOSIS — R2689 Other abnormalities of gait and mobility: Secondary | ICD-10-CM

## 2022-05-13 DIAGNOSIS — R42 Dizziness and giddiness: Secondary | ICD-10-CM

## 2022-05-13 DIAGNOSIS — R262 Difficulty in walking, not elsewhere classified: Secondary | ICD-10-CM

## 2022-05-13 DIAGNOSIS — R2681 Unsteadiness on feet: Secondary | ICD-10-CM

## 2022-05-13 NOTE — Therapy (Signed)
OUTPATIENT PHYSICAL THERAPY LOWER EXTREMITY EVALUATION   Patient Name: Valerie [name] MRN: [id number] DOB:02-26-1946, 76 y.o., female Today's Date: 05/13/2022   PT End of Session - 05/13/22 1457     Visit Number 2    PT Start Time 9528    PT Stop Time 1534    PT Time Calculation (min) 39 min    Equipment Utilized During Treatment Gait belt    Activity Tolerance Patient tolerated treatment well    Behavior During Therapy WFL for tasks assessed/performed   intermittent confusion             Past Medical History:  Diagnosis Date   Anemia    Anxiety and depression    Arthritis    Asthma    Cataract    Degenerative arthritis    Depression    Diabetes mellitus, type 2 (HCC)    Fatigue    GERD (gastroesophageal reflux disease)    Glaucoma    Heart palpitations    Hemorrhoids    Hyperlipidemia    Hypertension    Hypothyroidism    Hypoxemia 11/24/2013   Memory deficit 10/04/2013   Obesity    Sleep apnea    BiPAP   Snoring disorder    Past Surgical History:  Procedure Laterality Date   benign tumors resected     CATARACT EXTRACTION Left    FOOT SURGERY Left    bone spur   REFRACTIVE SURGERY     Glaucoma   ROTATOR CUFF REPAIR Left    TUBAL LIGATION     Patient Active Problem List   Diagnosis Date Noted   Encounter for general adult medical examination with abnormal findings 05/05/2022   Stage 3a chronic kidney disease ([place]) 08/26/2021   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 08/06/2021   Tinea corporis 10/01/2020   Degenerative arthritis of right knee 07/02/2020   Moderate episode of recurrent major depressive disorder ([place]) 07/01/2020   Esophageal dysphagia 05/27/2020   Gastroesophageal reflux disease with esophagitis without hemorrhage 05/27/2020   Diuretic-induced hypokalemia 03/27/2020   OAB (overactive bladder) 03/27/2020   Insomnia secondary to chronic pain 02/21/2020   Primary osteoarthritis involving multiple joints 10/10/2019   Thiamine  deficiency, unspecified 01/09/2019   Eczema 08/18/2018   Vitamin D deficiency disease 08/11/2018   Long-term current use of opiate analgesic 06/21/2018   External bleeding hemorrhoids 03/24/2018   Obstructive sleep apnea treated with BiPAP 11/03/2017   Osteopenia 03/28/2014   Insomnia 03/28/2014   Morbid obesity with BMI of 45.0-49.9, adult (Valerie [name]) 04/14/2013   Visit for screening mammogram 01/20/2013   Spinal stenosis of lumbar 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 10/05/2012   Hypothyroidism 02/20/2011   Fatty liver disease, nonalcoholic 41/32/4401   Mild intermittent asthma 10/25/2009   Type II diabetes mellitus with manifestations (Valerie [name]) 05/06/2009   Hyperlipidemia with target LDL less than 100 05/06/2009   B12 deficiency anemia 05/06/2009   Essential hypertension, benign 05/06/2009   GERD 05/06/2009    PCP: Janith Lima  REFERRING PROVIDER: Dohmeier, Dahlia Bailiff DIAG: ...  THERAPY DIAG:  Dizziness and giddiness  Difficulty in walking, not elsewhere classified  Other abnormalities of gait and mobility  Unsteadiness on feet  Rationale for Evaluation and Treatment Rehabilitation  ONSET DATE: 04/15/22  SUBJECTIVE:   SUBJECTIVE STATEMENT: Patient reports increased lightheadedness and dizziness at the start of visit. Patient states she was late because she went to fill gas and after she paid and was halfway here she realized she never filled her gas and  had to turn around and go back. She went to the podiatrist yesterday and is having trouble with plantar fascitis again. Dizziness score is 4/10 at start of treatment.   PERTINENT HISTORY: She is referred today for dizziness and ataxia , and at high risk of CVA due to cardiovascular disease, high calcium score, DM, Obesity, asthma and hypercholesterolemia. .   Pt reports almost daily dizziness and has been stumbling. Has had almost falls or falling into something like a sofa or chair. Dr Ronnald Ramp had ordered a  brain MRI, Cerebral  volume is not significantly changed since 2014, normal for age. And scattered small foci of nonspecific cerebral white matter  PAIN:  Are you having pain? No  FALLS:  Has patient fallen in last 6 months?  No falls, but multiple near falls.  PATIENT GOALS Identify why she is so dizzy and resolve it.   OBJECTIVE:   COGNITION:  Overall cognitive status: Within functional limits for tasks assessed    POSTURE: rounded shoulders and forward head   LOWER EXTREMITY ROM: WFL    LOWER EXTREMITY MMT:  MMT Right eval Left eval  Hip flexion 4- 4-  Hip extension    Hip abduction    Hip adduction    Hip internal rotation    Hip external rotation    Knee flexion 4- 4-  Knee extension 4- 4-  Ankle dorsiflexion 4- 4-  Ankle plantarflexion    Ankle inversion    Ankle eversion     (Blank rows = not tested)    FUNCTIONAL TESTS:  5 times sit to stand: 15.37sec Timed up and go (TUG): 22sec Functional gait assessment: 11/30  [facility] PT Assessment - 05/13/22 0001       Functional Gait  Assessment   Gait Level Surface Walks 20 ft, slow speed, abnormal gait pattern, evidence for imbalance or deviates 10-15 in outside of the 12 in walkway width. Requires more than 7 sec to ambulate 20 ft.    Change in Gait Speed Able to change speed, demonstrates mild gait deviations, deviates 6-10 in outside of the 12 in walkway width, or no gait deviations, unable to achieve a major change in velocity, or uses a change in velocity, or uses an assistive device.    Gait with Horizontal Head Turns Performs head turns with moderate changes in gait velocity, slows down, deviates 10-15 in outside 12 in walkway width but recovers, can continue to walk.    Gait with Vertical Head Turns Performs task with slight change in gait velocity (eg, minor disruption to smooth gait path), deviates 6 - 10 in outside 12 in walkway width or uses assistive device    Gait and Pivot Turn Turns slowly, requires verbal cueing, or  requires several small steps to catch balance following turn and stop    Step Over Obstacle Is able to step over one shoe box (4.5 in total height) but must slow down and adjust steps to clear box safely. May require verbal cueing.    Gait with Narrow Base of Support Ambulates less than 4 steps heel to toe or cannot perform without assistance.    Gait with Eyes Closed Walks 20 ft, slow speed, abnormal gait pattern, evidence for imbalance, deviates 10-15 in outside 12 in walkway width. Requires more than 9 sec to ambulate 20 ft.    Ambulating Backwards Walks 20 ft, slow speed, abnormal gait pattern, evidence for imbalance, deviates 10-15 in outside 12 in walkway width.    Steps Alternating  feet, must use rail.    Total Score 11              GAIT: Distance walked: 49' Assistive device utilized: None Level of assistance: SBA Comments: Unsteady, slow, cautious Vestibular Assessment Patient reports that she feels like an air head. No dizziness (although she reported dizziness with testing), eye ROM, eye alignment, Saccades, tracking, convergence all neg. VOR gain + in vertical direction.    TODAY'S TREATMENT:  05/13/22 TUG  FGA STS x5 VOR cancellation and Head Impulse Test Step ups on foam 2x10 with CGA Static standing of foam feet together EO 30s with increased sway towards L, CGA On foam EC requires minA swaying, able to correct for upright posture and hold for 10-15sec Walking with horizontal head turn, 3x64f CGA   PATIENT EDUCATION:  Education details: POC Person educated: Patient Education method: EConsulting civil engineer Demonstration, Tactile cues, and Verbal cues Education comprehension: verbalized understanding, returned demonstration, verbal cues required, needs further education, and is sometimes confused   HOME EXERCISE PROGRAM: TBD  ASSESSMENT:  CLINICAL IMPRESSION: Patient was seen today for physical therapy treatment for balance, unsteadiness on feet, and dizziness.  Patient has intermittent bouts of confusion and often loses her balance with dynamic activities. Today's session focused on functional measures and trying to figure out where her dizziness may be coming from. Patient presents with slight corrective saccades with Head Impulse Test and negative test for VOR cancellation. She rated dizziness at a 4/10 at the beginning of session, 6/10 halfway through, and a 3/10 at the end. Patient almost walked into the wall during TUG and had to restart. She had noticeable LOB during FGA especially with horizontal head turns, walking eyes closed, with narrow gait, and ambulating backwards. Pt is driving herself which is concerning due to her dizziness and confusion. She was educated to try drive with someone else for safety purposes.   OBJECTIVE IMPAIRMENTS Abnormal gait, decreased activity tolerance, decreased balance, decreased coordination, decreased mobility, difficulty walking, decreased strength, dizziness, improper body mechanics, and postural dysfunction.   ACTIVITY LIMITATIONS carrying, lifting, bending, standing, squatting, sleeping, stairs, transfers, and locomotion level  PARTICIPATION LIMITATIONS: meal prep, cleaning  PERSONAL FACTORS Age, Fitness, and Past/current experiences are also affecting patient's functional outcome.   REHAB POTENTIAL: Good  CLINICAL DECISION MAKING: Evolving/moderate complexity  EVALUATION COMPLEXITY: Moderate   GOALS: Goals reviewed with patient? Yes  SHORT TERM GOALS: Target date: 06/03/2022   I with initial HEP Baseline: Goal status: INITIAL  LONG TERM GOALS: Target date: 07/29/2022   I with final HEP Baseline:  Goal status: INITIAL  2.  Patient will perform TUG in < 12 sec, with no C/O dizziness to decrease fall risk. Baseline:  Goal status: INITIAL  3.  Patient will report no dizziness > 2/10 while performing normal daily activities. Baseline:  Goal status: INITIAL  4.  Patient will complete FGA with  score of at least 24/30 to demosntrate decreased fall risk. Baseline:  Goal status: INITIAL     PLAN: PT FREQUENCY: 2x/week  PT DURATION: 12 weeks  PLANNED INTERVENTIONS: Therapeutic exercises, Therapeutic activity, Neuromuscular re-education, Balance training, Gait training, Patient/Family education, Joint mobilization, and Vestibular training  PLAN FOR NEXT SESSION: Initiate HEP, canal testing to find root of dizziness, static and dynamic balance activities.   MAndris Baumann DPT 05/13/2022, 3:58 PM

## 2022-05-14 NOTE — Therapy (Signed)
OUTPATIENT PHYSICAL THERAPY LOWER EXTREMITY TREATMENT   Patient Name: Valerie [name] MRN: [id number] DOB:17-Oct-1946, 76 y.o., female Today's Date: 05/15/2022   PT End of Session - 05/15/22 1104     Visit Number 3    PT Start Time 1104    PT Stop Time 1144    PT Time Calculation (min) 40 min    Activity Tolerance Patient tolerated treatment well    Behavior During Therapy WFL for tasks assessed/performed               Past Medical History:  Diagnosis Date   Anemia    Anxiety and depression    Arthritis    Asthma    Cataract    Degenerative arthritis    Depression    Diabetes mellitus, type 2 (HCC)    Fatigue    GERD (gastroesophageal reflux disease)    Glaucoma    Heart palpitations    Hemorrhoids    Hyperlipidemia    Hypertension    Hypothyroidism    Hypoxemia 11/24/2013   Memory deficit 10/04/2013   Obesity    Sleep apnea    BiPAP   Snoring disorder    Past Surgical History:  Procedure Laterality Date   benign tumors resected     CATARACT EXTRACTION Left    FOOT SURGERY Left    bone spur   REFRACTIVE SURGERY     Glaucoma   ROTATOR CUFF REPAIR Left    TUBAL LIGATION     Patient Active Problem List   Diagnosis Date Noted   Encounter for general adult medical examination with abnormal findings 05/05/2022   Stage 3a chronic kidney disease ([place]) 08/26/2021   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 08/06/2021   Tinea corporis 10/01/2020   Degenerative arthritis of right knee 07/02/2020   Moderate episode of recurrent major depressive disorder ([place]) 07/01/2020   Esophageal dysphagia 05/27/2020   Gastroesophageal reflux disease with esophagitis without hemorrhage 05/27/2020   Diuretic-induced hypokalemia 03/27/2020   OAB (overactive bladder) 03/27/2020   Insomnia secondary to chronic pain 02/21/2020   Primary osteoarthritis involving multiple joints 10/10/2019   Thiamine deficiency, unspecified 01/09/2019   Eczema 08/18/2018   Vitamin D deficiency  disease 08/11/2018   Long-term current use of opiate analgesic 06/21/2018   External bleeding hemorrhoids 03/24/2018   Obstructive sleep apnea treated with BiPAP 11/03/2017   Osteopenia 03/28/2014   Insomnia 03/28/2014   Morbid obesity with BMI of 45.0-49.9, adult ([place]) 04/14/2013   Visit for screening mammogram 01/20/2013   Spinal stenosis of lumbar 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 10/05/2012   Hypothyroidism 02/20/2011   Fatty liver disease, nonalcoholic [id number]   Mild intermittent asthma 10/25/2009   Type II diabetes mellitus with manifestations ([place]) 05/06/2009   Hyperlipidemia with target LDL less than 100 05/06/2009   B12 deficiency anemia 05/06/2009   Essential hypertension, benign 05/06/2009   GERD 05/06/2009    PCP: Janith Lima  REFERRING PROVIDER: Dohmeier, Dahlia Bailiff DIAG: ...  THERAPY DIAG:  Dizziness and giddiness  Difficulty in walking, not elsewhere classified  Other abnormalities of gait and mobility  Unsteadiness on feet  Rationale for Evaluation and Treatment Rehabilitation  ONSET DATE: 04/15/22  SUBJECTIVE:   SUBJECTIVE STATEMENT:  Patient enters clinic using cane and states that her balance has been off since yesterday and she has been running into doors and walls. She has had some near falls in the past two days. Patient reports lightheadedness and dizziness 4/10.  PERTINENT HISTORY: She is referred today  for dizziness and ataxia , and at high risk of CVA due to cardiovascular disease, high calcium score, DM, Obesity, asthma and hypercholesterolemia. .   Pt reports almost daily dizziness and has been stumbling. Has had almost falls or falling into something like a sofa or chair. Dr Ronnald Ramp had ordered a  brain MRI, Cerebral volume is not significantly changed since 2014, normal for age. And scattered small foci of nonspecific cerebral white matter  PAIN:  Are you having pain? No  FALLS:  Has patient fallen in last 6 months?  No falls, but  multiple near falls.  PATIENT GOALS Identify why she is so dizzy and resolve it.   OBJECTIVE:   COGNITION:  Overall cognitive status: Within functional limits for tasks assessed    POSTURE: rounded shoulders and forward head   LOWER EXTREMITY ROM: WFL    LOWER EXTREMITY MMT:  MMT Right eval Left eval  Hip flexion 4- 4-  Hip extension    Hip abduction    Hip adduction    Hip internal rotation    Hip external rotation    Knee flexion 4- 4-  Knee extension 4- 4-  Ankle dorsiflexion 4- 4-  Ankle plantarflexion    Ankle inversion    Ankle eversion     (Blank rows = not tested)    FUNCTIONAL TESTS:  5 times sit to stand: 15.37sec Timed up and go (TUG): 22sec Functional gait assessment: 11/30   GAIT: Distance walked: 77' Assistive device utilized: None Level of assistance: SBA Comments: Unsteady, slow, cautious Vestibular Assessment Patient reports that she feels like an air head. No dizziness (although she reported dizziness with testing), eye ROM, eye alignment, Saccades, tracking, convergence all neg. VOR gain + in vertical direction.  Motion Sensitivity Quotient  Intensity: 0 = none, 1 = Lightheaded, 2 = Mild, 3 = Moderate, 4 = Severe, 5 = Vomiting  Intensity  1. Sitting to supine 2  2. Supine to L side 2  3. Supine to R side 2  4. Supine to sitting 3  5. L Hallpike-Dix   6. Up from L    7. R Hallpike-Dix   8. Up from R    9. Sitting, head  tipped to L knee 1  10. Head up from L  knee 0  11. Sitting, head  tipped to R knee 0  12. Head up from R  knee 0  13. Sitting head turns x5 0  14.Sitting head nods x5 0  15. In stance, 180  turn to L  1  16. In stance, 180  turn to R 1     TODAY'S TREATMENT:  05/15/22  Canal clearing   R Dix[place]pike-Clear  L [place]pike- Clear  Horizontal R- clear, L- geo nystagmus  Tandem standing  R foot 6s, 30s   L foot unable to get into w/o help but able to hold 30s w/CGA     05/13/22 TUG  FGA STS  x5 VOR cancellation and Head Impulse Test Step ups on foam 2x10 with CGA Static standing of foam feet together EO 30s with increased sway towards L, CGA On foam EC requires minA swaying, able to correct for upright posture and hold for 10-15sec Walking with horizontal head turn, 3x57f CGA   PATIENT EDUCATION:  Education details: POC Person educated: Patient Education method: EConsulting civil engineer Demonstration, Tactile cues, and Verbal cues Education comprehension: verbalized understanding, returned demonstration, verbal cues required, needs further education, and is sometimes confused   HOME EXERCISE PROGRAM:  TBD  ASSESSMENT:  CLINICAL IMPRESSION: Patient was seen today for physical therapy treatment for balance, unsteadiness on feet, and dizziness. Today's session focused on canal testing and clearing. Was able to clear R and L canals with Dix-Hallpike, patient did not have any symptoms. Tried horizontal canal testing and noted slight geotropic nystagmus on L side. Attempted to treat with BBQ. Patient reported no changes in dizziness/symptoms with vestibular maneuvers. Patient tolerated MSQ well following vestibular testing and treatment. She reports her dizziness mostly comes with standing and when bending forward she feels like she is about to tumble over. Tandem standing activity requires help to get into position but able to hold once she gets her balance, more difficulty with L foot in front. States her dizziness as 2/10 at end of session.    OBJECTIVE IMPAIRMENTS Abnormal gait, decreased activity tolerance, decreased balance, decreased coordination, decreased mobility, difficulty walking, decreased strength, dizziness, improper body mechanics, and postural dysfunction.   ACTIVITY LIMITATIONS carrying, lifting, bending, standing, squatting, sleeping, stairs, transfers, and locomotion level  PARTICIPATION LIMITATIONS: meal prep, cleaning  PERSONAL FACTORS Age, Fitness, and Past/current  experiences are also affecting patient's functional outcome.   REHAB POTENTIAL: Good  CLINICAL DECISION MAKING: Evolving/moderate complexity  EVALUATION COMPLEXITY: Moderate   GOALS: Goals reviewed with patient? Yes  SHORT TERM GOALS: Target date: 06/03/2022   I with initial HEP Baseline: Goal status: INITIAL  LONG TERM GOALS: Target date: 07/29/2022   I with final HEP Baseline:  Goal status: INITIAL  2.  Patient will perform TUG in < 12 sec, with no C/O dizziness to decrease fall risk. Baseline:  Goal status: INITIAL  3.  Patient will report no dizziness > 2/10 while performing normal daily activities. Baseline:  Goal status: INITIAL  4.  Patient will complete FGA with score of at least 24/30 to demosntrate decreased fall risk. Baseline:  Goal status: INITIAL     PLAN: PT FREQUENCY: 2x/week  PT DURATION: 12 weeks  PLANNED INTERVENTIONS: Therapeutic exercises, Therapeutic activity, Neuromuscular re-education, Balance training, Gait training, Patient/Family education, Joint mobilization, and Vestibular training  PLAN FOR NEXT SESSION: Initiate HEP, static and dynamic balance activities   Andris Baumann, DPT 05/15/2022, 11:55 AM

## 2022-05-15 ENCOUNTER — Ambulatory Visit: Payer: HMO | Attending: Neurology

## 2022-05-15 DIAGNOSIS — R2689 Other abnormalities of gait and mobility: Secondary | ICD-10-CM | POA: Diagnosis not present

## 2022-05-15 DIAGNOSIS — R262 Difficulty in walking, not elsewhere classified: Secondary | ICD-10-CM

## 2022-05-15 DIAGNOSIS — R42 Dizziness and giddiness: Secondary | ICD-10-CM | POA: Diagnosis not present

## 2022-05-15 DIAGNOSIS — R2681 Unsteadiness on feet: Secondary | ICD-10-CM

## 2022-05-15 DIAGNOSIS — M6281 Muscle weakness (generalized): Secondary | ICD-10-CM | POA: Diagnosis not present

## 2022-05-18 ENCOUNTER — Ambulatory Visit: Payer: HMO

## 2022-05-18 DIAGNOSIS — R262 Difficulty in walking, not elsewhere classified: Secondary | ICD-10-CM

## 2022-05-18 DIAGNOSIS — R2681 Unsteadiness on feet: Secondary | ICD-10-CM

## 2022-05-18 DIAGNOSIS — R42 Dizziness and giddiness: Secondary | ICD-10-CM | POA: Diagnosis not present

## 2022-05-18 NOTE — Therapy (Signed)
OUTPATIENT PHYSICAL THERAPY LOWER EXTREMITY TREATMENT   Patient Name: Valerie [name] MRN: [id number] DOB:1946-05-01, 76 y.o., female Today's Date: 05/18/2022   PT End of Session - 05/18/22 1409     Visit Number 4    Date for PT Re-Evaluation 07/29/22    PT Start Time 1409    PT Stop Time 8563    PT Time Calculation (min) 36 min    Equipment Utilized During Treatment Gait belt    Activity Tolerance Patient tolerated treatment well    Behavior During Therapy WFL for tasks assessed/performed                Past Medical History:  Diagnosis Date   Anemia    Anxiety and depression    Arthritis    Asthma    Cataract    Degenerative arthritis    Depression    Diabetes mellitus, type 2 (HCC)    Fatigue    GERD (gastroesophageal reflux disease)    Glaucoma    Heart palpitations    Hemorrhoids    Hyperlipidemia    Hypertension    Hypothyroidism    Hypoxemia 11/24/2013   Memory deficit 10/04/2013   Obesity    Sleep apnea    BiPAP   Snoring disorder    Past Surgical History:  Procedure Laterality Date   benign tumors resected     CATARACT EXTRACTION Left    FOOT SURGERY Left    bone spur   REFRACTIVE SURGERY     Glaucoma   ROTATOR CUFF REPAIR Left    TUBAL LIGATION     Patient Active Problem List   Diagnosis Date Noted   Encounter for general adult medical examination with abnormal findings 05/05/2022   Stage 3a chronic kidney disease ([place]) 08/26/2021   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 08/06/2021   Tinea corporis 10/01/2020   Degenerative arthritis of right knee 07/02/2020   Moderate episode of recurrent major depressive disorder ([place]) 07/01/2020   Esophageal dysphagia 05/27/2020   Gastroesophageal reflux disease with esophagitis without hemorrhage 05/27/2020   Diuretic-induced hypokalemia 03/27/2020   OAB (overactive bladder) 03/27/2020   Insomnia secondary to chronic pain 02/21/2020   Primary osteoarthritis involving multiple joints 10/10/2019    Thiamine deficiency, unspecified 01/09/2019   Eczema 08/18/2018   Vitamin D deficiency disease 08/11/2018   Long-term current use of opiate analgesic 06/21/2018   External bleeding hemorrhoids 03/24/2018   Obstructive sleep apnea treated with BiPAP 11/03/2017   Osteopenia 03/28/2014   Insomnia 03/28/2014   Morbid obesity with BMI of 45.0-49.9, adult ([place]) 04/14/2013   Visit for screening mammogram 01/20/2013   Spinal stenosis of lumbar 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 10/05/2012   Hypothyroidism 02/20/2011   Fatty liver disease, nonalcoholic 14/97/0263   Mild intermittent asthma 10/25/2009   Type II diabetes mellitus with manifestations ([place]) 05/06/2009   Hyperlipidemia with target LDL less than 100 05/06/2009   B12 deficiency anemia 05/06/2009   Essential hypertension, benign 05/06/2009   GERD 05/06/2009    PCP: Janith Lima  REFERRING PROVIDER: Dohmeier, Dahlia Bailiff DIAG: ...  THERAPY DIAG:  Difficulty in walking, not elsewhere classified  Dizziness and giddiness  Unsteadiness on feet  Rationale for Evaluation and Treatment Rehabilitation  ONSET DATE: 04/15/22  SUBJECTIVE:   SUBJECTIVE STATEMENT:  Patient reports no dizziness or lightheadedness at the beginning of session. Reports that she is still having near falls and dizziness especially when changing positions.   PERTINENT HISTORY: She is referred today for dizziness and ataxia ,  and at high risk of CVA due to cardiovascular disease, high calcium score, DM, Obesity, asthma and hypercholesterolemia. .   Pt reports almost daily dizziness and has been stumbling. Has had almost falls or falling into something like a sofa or chair. Dr Ronnald Ramp had ordered a  brain MRI, Cerebral volume is not significantly changed since 2014, normal for age. And scattered small foci of nonspecific cerebral white matter  PAIN:  Are you having pain? No  FALLS:  Has patient fallen in last 6 months?  No falls, but multiple near  falls.  PATIENT GOALS Identify why she is so dizzy and resolve it.   OBJECTIVE:   COGNITION:  Overall cognitive status: Within functional limits for tasks assessed    POSTURE: rounded shoulders and forward head   LOWER EXTREMITY ROM: WFL    LOWER EXTREMITY MMT:  MMT Right eval Left eval  Hip flexion 4- 4-  Hip extension    Hip abduction    Hip adduction    Hip internal rotation    Hip external rotation    Knee flexion 4- 4-  Knee extension 4- 4-  Ankle dorsiflexion 4- 4-  Ankle plantarflexion    Ankle inversion    Ankle eversion     (Blank rows = not tested)    FUNCTIONAL TESTS:  5 times sit to stand: 15.37sec Timed up and go (TUG): 22sec Functional gait assessment: 11/30   GAIT: Distance walked: 46' Assistive device utilized: None Level of assistance: SBA Comments: Unsteady, slow, cautious Vestibular Assessment Patient reports that she feels like an air head. No dizziness (although she reported dizziness with testing), eye ROM, eye alignment, Saccades, tracking, convergence all neg. VOR gain + in vertical direction.  Motion Sensitivity Quotient  Intensity: 0 = none, 1 = Lightheaded, 2 = Mild, 3 = Moderate, 4 = Severe, 5 = Vomiting  Intensity  1. Sitting to supine 2  2. Supine to L side 2  3. Supine to R side 2  4. Supine to sitting 3  5. L Hallpike-Dix   6. Up from L    7. R Hallpike-Dix   8. Up from R    9. Sitting, head  tipped to L knee 1  10. Head up from L  knee 0  11. Sitting, head  tipped to R knee 0  12. Head up from R  knee 0  13. Sitting head turns x5 0  14.Sitting head nods x5 0  15. In stance, 180  turn to L  1  16. In stance, 180  turn to R 1     TODAY'S TREATMENT:   05/18/22 Nustep L5, 72mns Cone taps 20 reps  Half tandem (increased sway)   R foot in front 30s    L in front 30s Full Tandem   R in front 20s (LOB)   L in front 30s SLS   R 3, 11   L 2, 5  VORx2 2x15reps Walking with head turns, minA d/t  LOB Sidestepping over obstacles w/CGA Standing on foam feet together EO, EC, and half tandem 30sec w/CGA   05/15/22  Can[place] clearing   R [place]-Clear  L [place]- Clear  Horizontal R- clear, L- geo nystagmus  Tandem standing  R foot 6s, 30s   L foot unable to get into w/o help but able to hold 30s w/CGA     PATIENT EDUCATION:  Education details: POC Person educated: Patient Education method: Explanation, Demonstration, Tactile cues, and Verbal cues Education comprehension:  verbalized understanding, returned demonstration, verbal cues required, needs further education, and is sometimes confused   HOME EXERCISE PROGRAM: TBD  ASSESSMENT:  CLINICAL IMPRESSION: Patient was seen today for treatment for balance, unsteadiness on feet, and dizziness. Today's session continued to focus on balance with static and locomotion activities. Pt is able to complete most balance tasks with CGA due to frequent loss of balance. She demonstrates the most difficulty with walking with head turns and SLS. Reports increase in dizziness to 2-3/10 with VOR exercise. States she feels "airheadish" at end of session.   OBJECTIVE IMPAIRMENTS Abnormal gait, decreased activity tolerance, decreased balance, decreased coordination, decreased mobility, difficulty walking, decreased strength, dizziness, improper body mechanics, and postural dysfunction.   ACTIVITY LIMITATIONS carrying, lifting, bending, standing, squatting, sleeping, stairs, transfers, and locomotion level  PARTICIPATION LIMITATIONS: meal prep, cleaning  PERSONAL FACTORS Age, Fitness, and Past/current experiences are also affecting patient's functional outcome.   REHAB POTENTIAL: Good  CLINICAL DECISION MAKING: Evolving/moderate complexity  EVALUATION COMPLEXITY: Moderate   GOALS: Goals reviewed with patient? Yes  SHORT TERM GOALS: Target date: 06/03/2022   I with initial HEP Baseline: Goal status: INITIAL  LONG TERM GOALS:  Target date: 07/29/2022   I with final HEP Baseline:  Goal status: INITIAL  2.  Patient will perform TUG in < 12 sec, with no C/O dizziness to decrease fall risk. Baseline:  Goal status: INITIAL  3.  Patient will report no dizziness > 2/10 while performing normal daily activities. Baseline:  Goal status: INITIAL  4.  Patient will complete FGA with score of at least 24/30 to demosntrate decreased fall risk. Baseline:  Goal status: INITIAL     PLAN: PT FREQUENCY: 2x/week  PT DURATION: 12 weeks  PLANNED INTERVENTIONS: Therapeutic exercises, Therapeutic activity, Neuromuscular re-education, Balance training, Gait training, Patient/Family education, Joint mobilization, and Vestibular training  PLAN FOR NEXT SESSION: Initiate HEP, more dynamic balance activities   Andris Baumann, DPT 05/18/2022, 2:54 PM

## 2022-05-19 ENCOUNTER — Other Ambulatory Visit: Payer: Self-pay | Admitting: Internal Medicine

## 2022-05-19 DIAGNOSIS — I10 Essential (primary) hypertension: Secondary | ICD-10-CM

## 2022-05-20 ENCOUNTER — Ambulatory Visit: Payer: HMO

## 2022-05-20 DIAGNOSIS — R2689 Other abnormalities of gait and mobility: Secondary | ICD-10-CM

## 2022-05-20 DIAGNOSIS — R2681 Unsteadiness on feet: Secondary | ICD-10-CM

## 2022-05-20 DIAGNOSIS — R42 Dizziness and giddiness: Secondary | ICD-10-CM | POA: Diagnosis not present

## 2022-05-20 DIAGNOSIS — R262 Difficulty in walking, not elsewhere classified: Secondary | ICD-10-CM

## 2022-05-20 NOTE — Therapy (Signed)
OUTPATIENT PHYSICAL THERAPY LOWER EXTREMITY TREATMENT   Patient Name: Valerie [name] MRN: [id number] DOB:11/08/1946, 76 y.o., female Today's Date: 05/20/2022   PT End of Session - 05/20/22 1405     Visit Number 5    PT Start Time 4403    PT Stop Time 4742    PT Time Calculation (min) 40 min    Activity Tolerance Patient tolerated treatment well    Behavior During Therapy WFL for tasks assessed/performed                 Past Medical History:  Diagnosis Date   Anemia    Anxiety and depression    Arthritis    Asthma    Cataract    Degenerative arthritis    Depression    Diabetes mellitus, type 2 (HCC)    Fatigue    GERD (gastroesophageal reflux disease)    Glaucoma    Heart palpitations    Hemorrhoids    Hyperlipidemia    Hypertension    Hypothyroidism    Hypoxemia 11/24/2013   Memory deficit 10/04/2013   Obesity    Sleep apnea    BiPAP   Snoring disorder    Past Surgical History:  Procedure Laterality Date   benign tumors resected     CATARACT EXTRACTION Left    FOOT SURGERY Left    bone spur   REFRACTIVE SURGERY     Glaucoma   ROTATOR CUFF REPAIR Left    TUBAL LIGATION     Patient Active Problem List   Diagnosis Date Noted   Encounter for general adult medical examination with abnormal findings 05/05/2022   Stage 3a chronic kidney disease ([place]) 08/26/2021   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 08/06/2021   Tinea corporis 10/01/2020   Degenerative arthritis of right knee 07/02/2020   Moderate episode of recurrent major depressive disorder ([place]) 07/01/2020   Esophageal dysphagia 05/27/2020   Gastroesophageal reflux disease with esophagitis without hemorrhage 05/27/2020   Diuretic-induced hypokalemia 03/27/2020   OAB (overactive bladder) 03/27/2020   Insomnia secondary to chronic pain 02/21/2020   Primary osteoarthritis involving multiple joints 10/10/2019   Thiamine deficiency, unspecified 01/09/2019   Eczema 08/18/2018   Vitamin D  deficiency disease 08/11/2018   Long-term current use of opiate analgesic 06/21/2018   External bleeding hemorrhoids 03/24/2018   Obstructive sleep apnea treated with BiPAP 11/03/2017   Osteopenia 03/28/2014   Insomnia 03/28/2014   Morbid obesity with BMI of 45.0-49.9, adult ([place]) 04/14/2013   Visit for screening mammogram 01/20/2013   Spinal stenosis of lumbar 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 10/05/2012   Hypothyroidism 02/20/2011   Fatty liver disease, nonalcoholic 59/56/3875   Mild intermittent asthma 10/25/2009   Type II diabetes mellitus with manifestations ([place]) 05/06/2009   Hyperlipidemia with target LDL less than 100 05/06/2009   B12 deficiency anemia 05/06/2009   Essential hypertension, benign 05/06/2009   GERD 05/06/2009    PCP: Janith Lima  REFERRING PROVIDER: Dohmeier, Dahlia Bailiff DIAG: ...  THERAPY DIAG:  Dizziness and giddiness  Difficulty in walking, not elsewhere classified  Unsteadiness on feet  Other abnormalities of gait and mobility  Rationale for Evaluation and Treatment Rehabilitation  ONSET DATE: 04/15/22  SUBJECTIVE:   SUBJECTIVE STATEMENT:  Patient reports she has been doing the same and dizziness is still about 2-3/10. Still feels like she is going to fall forward when bending forwards.   PERTINENT HISTORY: She is referred today for dizziness and ataxia , and at high risk of CVA due  to cardiovascular disease, high calcium score, DM, Obesity, asthma and hypercholesterolemia. .   Pt reports almost daily dizziness and has been stumbling. Has had almost falls or falling into something like a sofa or chair. Dr Ronnald Ramp had ordered a  brain MRI, Cerebral volume is not significantly changed since 2014, normal for age. And scattered small foci of nonspecific cerebral white matter  PAIN:  Are you having pain? No  FALLS:  Has patient fallen in last 6 months?  No falls, but multiple near falls.  PATIENT GOALS Identify why she is so dizzy and resolve  it.   OBJECTIVE:   COGNITION:  Overall cognitive status: Within functional limits for tasks assessed    POSTURE: rounded shoulders and forward head   LOWER EXTREMITY ROM: WFL    LOWER EXTREMITY MMT:  MMT Right eval Left eval  Hip flexion 4- 4-  Hip extension    Hip abduction    Hip adduction    Hip internal rotation    Hip external rotation    Knee flexion 4- 4-  Knee extension 4- 4-  Ankle dorsiflexion 4- 4-  Ankle plantarflexion    Ankle inversion    Ankle eversion     (Blank rows = not tested)    FUNCTIONAL TESTS:  5 times sit to stand: 15.37sec Timed up and go (TUG): 22sec Functional gait assessment: 11/30   GAIT: Distance walked: 56' Assistive device utilized: None Level of assistance: SBA Comments: Unsteady, slow, cautious Vestibular Assessment Patient reports that she feels like an air head. No dizziness (although she reported dizziness with testing), eye ROM, eye alignment, Saccades, tracking, convergence all neg. VOR gain + in vertical direction.  Motion Sensitivity Quotient  Intensity: 0 = none, 1 = Lightheaded, 2 = Mild, 3 = Moderate, 4 = Severe, 5 = Vomiting  Intensity  1. Sitting to supine 2  2. Supine to L side 2  3. Supine to R side 2  4. Supine to sitting 3  5. L Hallpike-Dix   6. Up from L    7. R Hallpike-Dix   8. Up from R    9. Sitting, head  tipped to L knee 1  10. Head up from L  knee 0  11. Sitting, head  tipped to R knee 0  12. Head up from R  knee 0  13. Sitting head turns x5 0  14.Sitting head nods x5 0  15. In stance, 180  turn to L  1  16. In stance, 180  turn to R 1     TODAY'S TREATMENT: 05/20/22  Nustep L5, 42mns  Walking and picking up and placing objects off ground     Half tandem 30s  Full tandem 30s   Full tandem on foam    R- 30s   L- 9s  SLS   R 4, 5, 5   L 0, 3, 7  VORx2 standing horizontal and vertical  Tandem walking    10steps w/o LOB x2   Sidestepping on foam beam, CGA  Narrow  walking on foam beam, CGA   05/18/22 Nustep L5, 531ms Cone taps 20 reps  Half tandem (increased sway)   R foot in front 30s    L in front 30s Full Tandem   R in front 20s (LOB)   L in front 30s SLS   R 3, 11   L 2, 5  VORx2 2x15reps Walking with head turns, minA d/t LOB Sidestepping over obstacles w/CGA Standing on foam  feet together EO, EC, and half tandem 30sec w/CGA     PATIENT EDUCATION:  Education details: POC Person educated: Patient Education method: Explanation, Demonstration, Tactile cues, and Verbal cues Education comprehension: verbalized understanding, returned demonstration, verbal cues required, needs further education, and is sometimes confused   HOME EXERCISE PROGRAM: SLS, tandem standing, VOR  ASSESSMENT:  CLINICAL IMPRESSION: Patient was seen today for treatment for ongoing dizziness and unsteadiness on feet. Today focused on habituation activities to onset dizziness and then take breaks and try again. Patient continues to complain that when she bends forward she will fall over so tried an activity where she was picking up and place cones on the ground. This increased dizziness up to 4 needs a sitting break, but then was able to do again with less dizziness after a break. Patient showed great improvements with balance activities and able to hold tandem and SLS for longer periods without support. She has some LOB but is able to correct it. She completed high level balance tasks that she was unable to do on her first few visits such as tandem walking. Patient was educated about her progress but still feels like her dizziness has not improved since starting PT. Will try to refer [facility] out to [facility] [address] to see if they can find the root of her dizziness with better technology and specialist involved.    OBJECTIVE IMPAIRMENTS Abnormal gait, decreased activity tolerance, decreased balance, decreased coordination, decreased mobility, difficulty walking,  decreased strength, dizziness, improper body mechanics, and postural dysfunction.   ACTIVITY LIMITATIONS carrying, lifting, bending, standing, squatting, sleeping, stairs, transfers, and locomotion level  PARTICIPATION LIMITATIONS: meal prep, cleaning  PERSONAL FACTORS Age, Fitness, and Past/current experiences are also affecting patient's functional outcome.   REHAB POTENTIAL: Good  CLINICAL DECISION MAKING: Evolving/moderate complexity  EVALUATION COMPLEXITY: Moderate   GOALS: Goals reviewed with patient? Yes  SHORT TERM GOALS: Target date: 06/03/2022   I with initial HEP Baseline: Goal status: INITIAL  LONG TERM GOALS: Target date: 07/29/2022   I with final HEP Baseline:  Goal status: INITIAL  2.  Patient will perform TUG in < 12 sec, with no C/O dizziness to decrease fall risk. Baseline:  Goal status: INITIAL  3.  Patient will report no dizziness > 2/10 while performing normal daily activities. Baseline:  Goal status: INITIAL  4.  Patient will complete FGA with score of at least 24/30 to demosntrate decreased fall risk. Baseline:  Goal status: INITIAL     PLAN: PT FREQUENCY: 2x/week  PT DURATION: 12 weeks  PLANNED INTERVENTIONS: Therapeutic exercises, Therapeutic activity, Neuromuscular re-education, Balance training, Gait training, Patient/Family education, Joint mobilization, and Vestibular training  PLAN FOR NEXT SESSION: Initiate HEP, more dynamic balance activities   Andris Baumann, DPT 05/20/2022, 2:56 PM

## 2022-05-25 ENCOUNTER — Ambulatory Visit: Payer: HMO

## 2022-05-25 NOTE — Therapy (Incomplete)
OUTPATIENT PHYSICAL THERAPY LOWER EXTREMITY TREATMENT   Patient Name: Valerie [name] MRN: [id number] DOB:January 29, 1946, 76 y.o., female Today's Date: 05/25/2022         Past Medical History:  Diagnosis Date   Anemia    Anxiety and depression    Arthritis    Asthma    Cataract    Degenerative arthritis    Depression    Diabetes mellitus, type 2 (HCC)    Fatigue    GERD (gastroesophageal reflux disease)    Glaucoma    Heart palpitations    Hemorrhoids    Hyperlipidemia    Hypertension    Hypothyroidism    Hypoxemia 11/24/2013   Memory deficit 10/04/2013   Obesity    Sleep apnea    BiPAP   Snoring disorder    Past Surgical History:  Procedure Laterality Date   benign tumors resected     CATARACT EXTRACTION Left    FOOT SURGERY Left    bone spur   REFRACTIVE SURGERY     Glaucoma   ROTATOR CUFF REPAIR Left    TUBAL LIGATION     Patient Active Problem List   Diagnosis Date Noted   Encounter for general adult medical examination with abnormal findings 05/05/2022   Stage 3a chronic kidney disease ([place]) 08/26/2021   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 08/06/2021   Tinea corporis 10/01/2020   Degenerative arthritis of right knee 07/02/2020   Moderate episode of recurrent major depressive disorder ([place]) 07/01/2020   Esophageal dysphagia 05/27/2020   Gastroesophageal reflux disease with esophagitis without hemorrhage 05/27/2020   Diuretic-induced hypokalemia 03/27/2020   OAB (overactive bladder) 03/27/2020   Insomnia secondary to chronic pain 02/21/2020   Primary osteoarthritis involving multiple joints 10/10/2019   Thiamine deficiency, unspecified 01/09/2019   Eczema 08/18/2018   Vitamin D deficiency disease 08/11/2018   Long-term current use of opiate analgesic 06/21/2018   External bleeding hemorrhoids 03/24/2018   Obstructive sleep apnea treated with BiPAP 11/03/2017   Osteopenia 03/28/2014   Insomnia 03/28/2014   Morbid obesity with BMI of 45.0-49.9,  adult ([place]) 04/14/2013   Visit for screening mammogram 01/20/2013   Spinal stenosis of lumbar 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 10/05/2012   Hypothyroidism 02/20/2011   Fatty liver disease, nonalcoholic 75/64/3329   Mild intermittent asthma 10/25/2009   Type II diabetes mellitus with manifestations ([place]) 05/06/2009   Hyperlipidemia with target LDL less than 100 05/06/2009   B12 deficiency anemia 05/06/2009   Essential hypertension, benign 05/06/2009   GERD 05/06/2009    PCP: Janith Lima  REFERRING PROVIDER: Dohmeier, Dahlia Bailiff DIAG: ...  THERAPY DIAG:  No diagnosis found.  Rationale for Evaluation and Treatment Rehabilitation  ONSET DATE: 04/15/22  SUBJECTIVE:   SUBJECTIVE STATEMENT:  Patient reports she has been doing the same and dizziness is still about 2-3/10. Still feels like she is going to fall forward when bending forwards.   PERTINENT HISTORY: She is referred today for dizziness and ataxia , and at high risk of CVA due to cardiovascular disease, high calcium score, DM, Obesity, asthma and hypercholesterolemia. .   Pt reports almost daily dizziness and has been stumbling. Has had almost falls or falling into something like a sofa or chair. Dr Ronnald Ramp had ordered a  brain MRI, Cerebral volume is not significantly changed since 2014, normal for age. And scattered small foci of nonspecific cerebral white matter  PAIN:  Are you having pain? No  FALLS:  Has patient fallen in last 6 months?  No  falls, but multiple near falls.  PATIENT GOALS Identify why she is so dizzy and resolve it.   OBJECTIVE:   COGNITION:  Overall cognitive status: Within functional limits for tasks assessed    POSTURE: rounded shoulders and forward head   LOWER EXTREMITY ROM: WFL    LOWER EXTREMITY MMT:  MMT Right eval Left eval  Hip flexion 4- 4-  Hip extension    Hip abduction    Hip adduction    Hip internal rotation    Hip external rotation    Knee flexion 4- 4-  Knee  extension 4- 4-  Ankle dorsiflexion 4- 4-  Ankle plantarflexion    Ankle inversion    Ankle eversion     (Blank rows = not tested)    FUNCTIONAL TESTS:  5 times sit to stand: 15.37sec Timed up and go (TUG): 22sec Functional gait assessment: 11/30   GAIT: Distance walked: 110' Assistive device utilized: None Level of assistance: SBA Comments: Unsteady, slow, cautious Vestibular Assessment Patient reports that she feels like an air head. No dizziness (although she reported dizziness with testing), eye ROM, eye alignment, Saccades, tracking, convergence all neg. VOR gain + in vertical direction.  Motion Sensitivity Quotient  Intensity: 0 = none, 1 = Lightheaded, 2 = Mild, 3 = Moderate, 4 = Severe, 5 = Vomiting  Intensity  1. Sitting to supine 2  2. Supine to L side 2  3. Supine to R side 2  4. Supine to sitting 3  5. L Hallpike-Dix   6. Up from L    7. R Hallpike-Dix   8. Up from R    9. Sitting, head  tipped to L knee 1  10. Head up from L  knee 0  11. Sitting, head  tipped to R knee 0  12. Head up from R  knee 0  13. Sitting head turns x5 0  14.Sitting head nods x5 0  15. In stance, 180  turn to L  1  16. In stance, 180  turn to R 1     TODAY'S TREATMENT: 05/25/22  Nustep  STS to OHP yellow ball 2x10  SLS   R   L  Tandem walking  Sidestepping and narrow walking on foam  Walking while doing VOR   Lateral banded walks       05/20/22  Nustep L5, 80mns  Walking and picking up and placing objects off ground     Half tandem 30s  Full tandem 30s   Full tandem on foam    R- 30s   L- 9s  SLS   R 4, 5, 5   L 0, 3, 7  VORx2 standing horizontal and vertical  Tandem walking    10steps w/o LOB x2   Sidestepping on foam beam, CGA  Narrow walking on foam beam, CGA   05/18/22 Nustep L5, 564ms Cone taps 20 reps  Half tandem (increased sway)   R foot in front 30s    L in front 30s Full Tandem   R in front 20s (LOB)   L in front 30s SLS   R 3,  11   L 2, 5  VORx2 2x15reps Walking with head turns, minA d/t LOB Sidestepping over obstacles w/CGA Standing on foam feet together EO, EC, and half tandem 30sec w/CGA     PATIENT EDUCATION:  Education details: POC Person educated: Patient Education method: Explanation, Demonstration, Tactile cues, and Verbal cues Education comprehension: verbalized understanding, returned demonstration, verbal cues required,  needs further education, and is sometimes confused   HOME EXERCISE PROGRAM: SLS, tandem standing, VOR  ASSESSMENT:  CLINICAL IMPRESSION: Patient was seen today for treatment for ongoing dizziness and unsteadiness on feet. Today focused on habituation activities to onset dizziness and then take breaks and try again. Patient continues to complain that when she bends forward she will fall over so tried an activity where she was picking up and place cones on the ground. This increased dizziness up to 4 needs a sitting break, but then was able to do again with less dizziness after a break. Patient showed great improvements with balance activities and able to hold tandem and SLS for longer periods without support. She has some LOB but is able to correct it. She completed high level balance tasks that she was unable to do on her first few visits such as tandem walking. Patient was educated about her progress but still feels like her dizziness has not improved since starting PT. Will try to refer [facility] out to [facility] [address] to see if they can find the root of her dizziness with better technology and specialist involved.    OBJECTIVE IMPAIRMENTS Abnormal gait, decreased activity tolerance, decreased balance, decreased coordination, decreased mobility, difficulty walking, decreased strength, dizziness, improper body mechanics, and postural dysfunction.   ACTIVITY LIMITATIONS carrying, lifting, bending, standing, squatting, sleeping, stairs, transfers, and locomotion  level  PARTICIPATION LIMITATIONS: meal prep, cleaning  PERSONAL FACTORS Age, Fitness, and Past/current experiences are also affecting patient's functional outcome.   REHAB POTENTIAL: Good  CLINICAL DECISION MAKING: Evolving/moderate complexity  EVALUATION COMPLEXITY: Moderate   GOALS: Goals reviewed with patient? Yes  SHORT TERM GOALS: Target date: 06/03/2022   I with initial HEP Baseline: Goal status: INITIAL  LONG TERM GOALS: Target date: 07/29/2022   I with final HEP Baseline:  Goal status: INITIAL  2.  Patient will perform TUG in < 12 sec, with no C/O dizziness to decrease fall risk. Baseline:  Goal status: INITIAL  3.  Patient will report no dizziness > 2/10 while performing normal daily activities. Baseline:  Goal status: INITIAL  4.  Patient will complete FGA with score of at least 24/30 to demosntrate decreased fall risk. Baseline:  Goal status: INITIAL     PLAN: PT FREQUENCY: 2x/week  PT DURATION: 12 weeks  PLANNED INTERVENTIONS: Therapeutic exercises, Therapeutic activity, Neuromuscular re-education, Balance training, Gait training, Patient/Family education, Joint mobilization, and Vestibular training  PLAN FOR NEXT SESSION: Initiate HEP, more dynamic balance activities   Andris Baumann, DPT 05/25/2022, 8:09 AM

## 2022-05-27 ENCOUNTER — Encounter: Payer: Self-pay | Admitting: Physical Therapy

## 2022-05-27 ENCOUNTER — Ambulatory Visit: Payer: HMO | Admitting: Physical Therapy

## 2022-05-27 DIAGNOSIS — R2689 Other abnormalities of gait and mobility: Secondary | ICD-10-CM

## 2022-05-27 DIAGNOSIS — M6281 Muscle weakness (generalized): Secondary | ICD-10-CM

## 2022-05-27 DIAGNOSIS — R42 Dizziness and giddiness: Secondary | ICD-10-CM

## 2022-05-27 DIAGNOSIS — R2681 Unsteadiness on feet: Secondary | ICD-10-CM

## 2022-05-27 DIAGNOSIS — R262 Difficulty in walking, not elsewhere classified: Secondary | ICD-10-CM

## 2022-05-27 NOTE — Therapy (Signed)
OUTPATIENT PHYSICAL THERAPY LOWER EXTREMITY TREATMENT   Patient Name: Valerie [name] MRN: [id number] DOB:05-16-1946, 76 y.o., female Today's Date: 05/27/2022   PT End of Session - 05/27/22 1534     Visit Number 6    PT Start Time 7829    PT Stop Time 5621    PT Time Calculation (min) 33 min    Activity Tolerance Patient tolerated treatment well    Behavior During Therapy WFL for tasks assessed/performed                  Past Medical History:  Diagnosis Date   Anemia    Anxiety and depression    Arthritis    Asthma    Cataract    Degenerative arthritis    Depression    Diabetes mellitus, type 2 (HCC)    Fatigue    GERD (gastroesophageal reflux disease)    Glaucoma    Heart palpitations    Hemorrhoids    Hyperlipidemia    Hypertension    Hypothyroidism    Hypoxemia 11/24/2013   Memory deficit 10/04/2013   Obesity    Sleep apnea    BiPAP   Snoring disorder    Past Surgical History:  Procedure Laterality Date   benign tumors resected     CATARACT EXTRACTION Left    FOOT SURGERY Left    bone spur   REFRACTIVE SURGERY     Glaucoma   ROTATOR CUFF REPAIR Left    TUBAL LIGATION     Patient Active Problem List   Diagnosis Date Noted   Encounter for general adult medical examination with abnormal findings 05/05/2022   Stage 3a chronic kidney disease ([place]) 08/26/2021   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 08/06/2021   Tinea corporis 10/01/2020   Degenerative arthritis of right knee 07/02/2020   Moderate episode of recurrent major depressive disorder ([place]) 07/01/2020   Esophageal dysphagia 05/27/2020   Gastroesophageal reflux disease with esophagitis without hemorrhage 05/27/2020   Diuretic-induced hypokalemia 03/27/2020   OAB (overactive bladder) 03/27/2020   Insomnia secondary to chronic pain 02/21/2020   Primary osteoarthritis involving multiple joints 10/10/2019   Thiamine deficiency, unspecified 01/09/2019   Eczema 08/18/2018   Vitamin D  deficiency disease 08/11/2018   Long-term current use of opiate analgesic 06/21/2018   External bleeding hemorrhoids 03/24/2018   Obstructive sleep apnea treated with BiPAP 11/03/2017   Osteopenia 03/28/2014   Insomnia 03/28/2014   Morbid obesity with BMI of 45.0-49.9, adult ([place]) 04/14/2013   Visit for screening mammogram 01/20/2013   Spinal stenosis of lumbar 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 10/05/2012   Hypothyroidism 02/20/2011   Fatty liver disease, nonalcoholic 30/86/5784   Mild intermittent asthma 10/25/2009   Type II diabetes mellitus with manifestations ([place]) 05/06/2009   Hyperlipidemia with target LDL less than 100 05/06/2009   B12 deficiency anemia 05/06/2009   Essential hypertension, benign 05/06/2009   GERD 05/06/2009    PCP: Janith Lima  REFERRING PROVIDER: Dohmeier, Dahlia Bailiff DIAG: ...  THERAPY DIAG:  Dizziness and giddiness  Difficulty in walking, not elsewhere classified  Unsteadiness on feet  Other abnormalities of gait and mobility  Muscle weakness (generalized)  Rationale for Evaluation and Treatment Rehabilitation  ONSET DATE: 04/15/22  SUBJECTIVE:   SUBJECTIVE STATEMENT:  Patient reports that her dizziness continues. She had to cancel her appointment on Monday due to the dizziness, afraid to drive.  PERTINENT HISTORY: She is referred today for dizziness and ataxia , and at high risk of CVA due to  cardiovascular disease, high calcium score, DM, Obesity, asthma and hypercholesterolemia. .   Pt reports almost daily dizziness and has been stumbling. Has had almost falls or falling into something like a sofa or chair. Dr Ronnald Ramp had ordered a  brain MRI, Cerebral volume is not significantly changed since 2014, normal for age. And scattered small foci of nonspecific cerebral white matter  PAIN:  Are you having pain? No  FALLS:  Has patient fallen in last 6 months?  No falls, but multiple near falls.  PATIENT GOALS Identify why she is so dizzy and  resolve it.   OBJECTIVE:   COGNITION:  Overall cognitive status: Within functional limits for tasks assessed    POSTURE: rounded shoulders and forward head   LOWER EXTREMITY ROM: WFL    LOWER EXTREMITY MMT:  MMT Right eval Left eval  Hip flexion 4- 4-  Hip extension    Hip abduction    Hip adduction    Hip internal rotation    Hip external rotation    Knee flexion 4- 4-  Knee extension 4- 4-  Ankle dorsiflexion 4- 4-  Ankle plantarflexion    Ankle inversion    Ankle eversion     (Blank rows = not tested)    FUNCTIONAL TESTS:  5 times sit to stand: 15.37sec Timed up and go (TUG): 22sec Functional gait assessment: 11/30   GAIT: Distance walked: 88' Assistive device utilized: None Level of assistance: SBA Comments: Unsteady, slow, cautious Vestibular Assessment Patient reports that she feels like an air head. No dizziness (although she reported dizziness with testing), eye ROM, eye alignment, Saccades, tracking, convergence all neg. VOR gain + in vertical direction.  Motion Sensitivity Quotient  Intensity: 0 = none, 1 = Lightheaded, 2 = Mild, 3 = Moderate, 4 = Severe, 5 = Vomiting  Intensity  1. Sitting to supine 2  2. Supine to L side 2  3. Supine to R side 2  4. Supine to sitting 3  5. L Hallpike-Dix   6. Up from L    7. R Hallpike-Dix   8. Up from R    9. Sitting, head  tipped to L knee 1  10. Head up from L  knee 0  11. Sitting, head  tipped to R knee 0  12. Head up from R  knee 0  13. Sitting head turns x5 0  14.Sitting head nods x5 0  15. In stance, 180  turn to L  1  16. In stance, 180  turn to R 1     TODAY'S TREATMENT:   05/27/22 Nu-step L5 x 6 minutes Patient reported dizziness, with mild loss of balance to her R upon first standing. Orthostatic BPs Supine 131/76 HR 88 Sit, Immediate  97/68 92, 3 min  115/77 90, no symptoms Stand, Immed  103/73 94, 3 min 133/88 91  05/20/22  Nustep L5, 73mns  Walking and picking up and  placing objects off ground     Half tandem 30s  Full tandem 30s   Full tandem on foam    R- 30s   L- 9s  SLS   R 4, 5, 5   L 0, 3, 7  VORx2 standing horizontal and vertical  Tandem walking    10steps w/o LOB x2   Sidestepping on foam beam, CGA  Narrow walking on foam beam, CGA   05/18/22 Nustep L5, 567ms Cone taps 20 reps  Half tandem (increased sway)   R foot in front 30s  L in front 30s Full Tandem   R in front 20s (LOB)   L in front 30s SLS   R 3, 11   L 2, 5  VORx2 2x15reps Walking with head turns, minA d/t LOB Sidestepping over obstacles w/CGA Standing on foam feet together EO, EC, and half tandem 30sec w/CGA     PATIENT EDUCATION:  Education details: POC Person educated: Patient Education method: Consulting civil engineer, Demonstration, Tactile cues, and Verbal cues Education comprehension: verbalized understanding, returned demonstration, verbal cues required, needs further education, and is sometimes confused   HOME EXERCISE PROGRAM: SLS, tandem standing, VOR  ASSESSMENT:  CLINICAL IMPRESSION: Patient arrived 10 minutes late for appointment She reports continued problems with her dizziness. She was not able to make her Monday appointment due to being unable to drive with dizziness. Patients issues appear to be multifactorial, with BP contributing at times along with other, vestibular contributions. Plan to continue with habituation activities and continue to assess for other sources of dizziness.   OBJECTIVE IMPAIRMENTS Abnormal gait, decreased activity tolerance, decreased balance, decreased coordination, decreased mobility, difficulty walking, decreased strength, dizziness, improper body mechanics, and postural dysfunction.   ACTIVITY LIMITATIONS carrying, lifting, bending, standing, squatting, sleeping, stairs, transfers, and locomotion level  PARTICIPATION LIMITATIONS: meal prep, cleaning  PERSONAL FACTORS Age, Fitness, and Past/current experiences are also  affecting patient's functional outcome.   REHAB POTENTIAL: Good  CLINICAL DECISION MAKING: Evolving/moderate complexity  EVALUATION COMPLEXITY: Moderate   GOALS: Goals reviewed with patient? Yes  SHORT TERM GOALS: Target date: 06/03/2022   I with initial HEP Baseline: Goal status: INITIAL  LONG TERM GOALS: Target date: 07/29/2022   I with final HEP Baseline:  Goal status: INITIAL  2.  Patient will perform TUG in < 12 sec, with no C/O dizziness to decrease fall risk. Baseline:  Goal status: INITIAL  3.  Patient will report no dizziness > 2/10 while performing normal daily activities. Baseline:  Goal status: INITIAL  4.  Patient will complete FGA with score of at least 24/30 to demosntrate decreased fall risk. Baseline:  Goal status: INITIAL     PLAN: PT FREQUENCY: 2x/week  PT DURATION: 12 weeks  PLANNED INTERVENTIONS: Therapeutic exercises, Therapeutic activity, Neuromuscular re-education, Balance training, Gait training, Patient/Family education, Joint mobilization, and Vestibular training  PLAN FOR NEXT SESSION: Initiate HEP, more dynamic balance activities   Ethel Rana DPT 05/27/22 5:19 PM  05/27/2022, 5:19 PM

## 2022-06-01 ENCOUNTER — Ambulatory Visit: Payer: HMO | Admitting: Physical Therapy

## 2022-06-01 ENCOUNTER — Encounter: Payer: Self-pay | Admitting: Physical Therapy

## 2022-06-01 DIAGNOSIS — R2681 Unsteadiness on feet: Secondary | ICD-10-CM

## 2022-06-01 DIAGNOSIS — M6281 Muscle weakness (generalized): Secondary | ICD-10-CM

## 2022-06-01 DIAGNOSIS — R2689 Other abnormalities of gait and mobility: Secondary | ICD-10-CM

## 2022-06-01 DIAGNOSIS — R262 Difficulty in walking, not elsewhere classified: Secondary | ICD-10-CM

## 2022-06-01 DIAGNOSIS — R42 Dizziness and giddiness: Secondary | ICD-10-CM

## 2022-06-01 NOTE — Therapy (Signed)
OUTPATIENT PHYSICAL THERAPY LOWER EXTREMITY TREATMENT   Patient Name: Valerie [name] MRN: [id number] DOB:27-Feb-1946, 76 y.o., female Today's Date: 06/01/2022          Past Medical History:  Diagnosis Date   Anemia    Anxiety and depression    Arthritis    Asthma    Cataract    Degenerative arthritis    Depression    Diabetes mellitus, type 2 (HCC)    Fatigue    GERD (gastroesophageal reflux disease)    Glaucoma    Heart palpitations    Hemorrhoids    Hyperlipidemia    Hypertension    Hypothyroidism    Hypoxemia 11/24/2013   Memory deficit 10/04/2013   Obesity    Sleep apnea    BiPAP   Snoring disorder    Past Surgical History:  Procedure Laterality Date   benign tumors resected     CATARACT EXTRACTION Left    FOOT SURGERY Left    bone spur   REFRACTIVE SURGERY     Glaucoma   ROTATOR CUFF REPAIR Left    TUBAL LIGATION     Patient Active Problem List   Diagnosis Date Noted   Encounter for general adult medical examination with abnormal findings 05/05/2022   Stage 3a chronic kidney disease ([place]) 08/26/2021   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 08/06/2021   Tinea corporis 10/01/2020   Degenerative arthritis of right knee 07/02/2020   Moderate episode of recurrent major depressive disorder ([place]) 07/01/2020   Esophageal dysphagia 05/27/2020   Gastroesophageal reflux disease with esophagitis without hemorrhage 05/27/2020   Diuretic-induced hypokalemia 03/27/2020   OAB (overactive bladder) 03/27/2020   Insomnia secondary to chronic pain 02/21/2020   Primary osteoarthritis involving multiple joints 10/10/2019   Thiamine deficiency, unspecified 01/09/2019   Eczema 08/18/2018   Vitamin D deficiency disease 08/11/2018   Long-term current use of opiate analgesic 06/21/2018   External bleeding hemorrhoids 03/24/2018   Obstructive sleep apnea treated with BiPAP 11/03/2017   Osteopenia 03/28/2014   Insomnia 03/28/2014   Morbid obesity with BMI of 45.0-49.9,  adult ([place]) 04/14/2013   Visit for screening mammogram 01/20/2013   Spinal stenosis of lumbar 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 10/05/2012   Hypothyroidism 02/20/2011   Fatty liver disease, nonalcoholic 96/28/3662   Mild intermittent asthma 10/25/2009   Type II diabetes mellitus with manifestations ([place]) 05/06/2009   Hyperlipidemia with target LDL less than 100 05/06/2009   B12 deficiency anemia 05/06/2009   Essential hypertension, benign 05/06/2009   GERD 05/06/2009    PCP: Janith Lima  REFERRING PROVIDER: Dohmeier, Dahlia Bailiff DIAG: ...  THERAPY DIAG:  No diagnosis found.  Rationale for Evaluation and Treatment Rehabilitation  ONSET DATE: 04/15/22  SUBJECTIVE:   SUBJECTIVE STATEMENT:  Patient reports that her dizziness continues. She had to cancel her appointment on Monday due to the dizziness, afraid to drive.  PERTINENT HISTORY: She is referred today for dizziness and ataxia , and at high risk of CVA due to cardiovascular disease, high calcium score, DM, Obesity, asthma and hypercholesterolemia. .   Pt reports almost daily dizziness and has been stumbling. Has had almost falls or falling into something like a sofa or chair. Dr Ronnald Ramp had ordered a  brain MRI, Cerebral volume is not significantly changed since 2014, normal for age. And scattered small foci of nonspecific cerebral white matter  PAIN:  Are you having pain? No  FALLS:  Has patient fallen in last 6 months?  No falls, but multiple near falls.  PATIENT GOALS Identify why she is so dizzy and resolve it.   OBJECTIVE:   COGNITION:  Overall cognitive status: Within functional limits for tasks assessed    POSTURE: rounded shoulders and forward head   LOWER EXTREMITY ROM: WFL    LOWER EXTREMITY MMT:  MMT Right eval Left eval  Hip flexion 4- 4-  Hip extension    Hip abduction    Hip adduction    Hip internal rotation    Hip external rotation    Knee flexion 4- 4-  Knee extension 4- 4-  Ankle  dorsiflexion 4- 4-  Ankle plantarflexion    Ankle inversion    Ankle eversion     (Blank rows = not tested)    FUNCTIONAL TESTS:  5 times sit to stand: 15.37sec Timed up and go (TUG): 22sec Functional gait assessment: 11/30   GAIT: Distance walked: 46' Assistive device utilized: None Level of assistance: SBA Comments: Unsteady, slow, cautious Vestibular Assessment Patient reports that she feels like an air head. No dizziness (although she reported dizziness with testing), eye ROM, eye alignment, Saccades, tracking, convergence all neg. VOR gain + in vertical direction.  Motion Sensitivity Quotient  Intensity: 0 = none, 1 = Lightheaded, 2 = Mild, 3 = Moderate, 4 = Severe, 5 = Vomiting  Intensity  1. Sitting to supine 2  2. Supine to L side 2  3. Supine to R side 2  4. Supine to sitting 3  5. L Hallpike-Dix   6. Up from L    7. R Hallpike-Dix   8. Up from R    9. Sitting, head  tipped to L knee 1  10. Head up from L  knee 0  11. Sitting, head  tipped to R knee 0  12. Head up from R  knee 0  13. Sitting head turns x5 0  14.Sitting head nods x5 0  15. In stance, 180  turn to L  1  16. In stance, 180  turn to R 1     TODAY'S TREATMENT:  06/01/22 VOR x 2 horizontal- no increased dizziness. Vertical- mildly increased. Gaze stabilization- no increase vert or horizontal Seated forward bend- 10 reps, reaching to 8", then to 4" above floor, mildly elevated symptoms each time, start 2, end 2-3, 1 minute recovery. Standing forward bend to knee height, then reach up overhead, x 10 reps. Increased from level 2-level3 dizziness, unsteady when trying to sit. Standing with back into corner, head turns horizontal 2 x 10 reps, mild dizziness. Sit to stand from chair facing corner for safety. Very unsteady upon first standing. Instructed to try to stand until she feels stable, then return to sit, 10 reps, required min A to recover balance x 1.  05/27/22 Nu-step L5 x 6  minutes Patient reported dizziness, with mild loss of balance to her R upon first standing. Orthostatic BPs Supine 131/76 HR 88 Sit, Immediate  97/68 92, 3 min  115/77 90, no symptoms Stand, Immed  103/73 94, 3 min 133/88 91  05/20/22  Nustep L5, 58mns  Walking and picking up and placing objects off ground     Half tandem 30s  Full tandem 30s   Full tandem on foam    R- 30s   L- 9s  SLS   R 4, 5, 5   L 0, 3, 7  VORx2 standing horizontal and vertical  Tandem walking    10steps w/o LOB x2   Sidestepping on foam beam, CGA  Narrow  walking on foam beam, CGA   05/18/22 Nustep L5, 85mns Cone taps 20 reps  Half tandem (increased sway)   R foot in front 30s    L in front 30s Full Tandem   R in front 20s (LOB)   L in front 30s SLS   R 3, 11   L 2, 5  VORx2 2x15reps Walking with head turns, minA d/t LOB Sidestepping over obstacles w/CGA Standing on foam feet together EO, EC, and half tandem 30sec w/CGA     PATIENT EDUCATION:  Education details: HEP Person educated: Patient Education method: Explanation, Demonstration, Tactile cues, and Verbal cues Education comprehension: verbalized understanding, returned demonstration, verbal cues required, needs further education, and is sometimes confused   HOME EXERCISE PROGRAM:   [id number] ASSESSMENT:   CLINICAL IMPRESSION: Patient reports continued unsteadiness as well as fluctuating BP. Will contact Dr regarding BP. Initiated HEP for balance.   OBJECTIVE IMPAIRMENTS Abnormal gait, decreased activity tolerance, decreased balance, decreased coordination, decreased mobility, difficulty walking, decreased strength, dizziness, improper body mechanics, and postural dysfunction.   ACTIVITY LIMITATIONS carrying, lifting, bending, standing, squatting, sleeping, stairs, transfers, and locomotion level  PARTICIPATION LIMITATIONS: meal prep, cleaning  PERSONAL FACTORS Age, Fitness, and Past/current experiences are also affecting patient's  functional outcome.   REHAB POTENTIAL: Good  CLINICAL DECISION MAKING: Evolving/moderate complexity  EVALUATION COMPLEXITY: Moderate   GOALS: Goals reviewed with patient? Yes  SHORT TERM GOALS: Target date: 06/03/2022   I with initial HEP Baseline: Goal status: In progress  LONG TERM GOALS: Target date: 07/29/2022   I with final HEP Baseline:  Goal status: INITIAL  2.  Patient will perform TUG in < 12 sec, with no C/O dizziness to decrease fall risk. Baseline:  Goal status: INITIAL  3.  Patient will report no dizziness > 2/10 while performing normal daily activities. Baseline:  Goal status: INITIAL  4.  Patient will complete FGA with score of at least 24/30 to demosntrate decreased fall risk. Baseline:  Goal status: INITIAL     PLAN: PT FREQUENCY: 2x/week  PT DURATION: 12 weeks  PLANNED INTERVENTIONS: Therapeutic exercises, Therapeutic activity, Neuromuscular re-education, Balance training, Gait training, Patient/Family education, Joint mobilization, and Vestibular training  PLAN FOR NEXT SESSION: Initiate HEP, more dynamic balance activities   SEthel RanaDPT 06/01/22 1:14 PM  06/01/2022, 1:14 PM

## 2022-06-03 ENCOUNTER — Ambulatory Visit: Payer: HMO | Admitting: Physical Therapy

## 2022-06-08 ENCOUNTER — Ambulatory Visit: Payer: HMO | Admitting: Physical Therapy

## 2022-06-10 ENCOUNTER — Ambulatory Visit: Payer: HMO | Admitting: Physical Therapy

## 2022-06-10 ENCOUNTER — Encounter: Payer: Self-pay | Admitting: Physical Therapy

## 2022-06-10 DIAGNOSIS — M6281 Muscle weakness (generalized): Secondary | ICD-10-CM

## 2022-06-10 DIAGNOSIS — R2689 Other abnormalities of gait and mobility: Secondary | ICD-10-CM

## 2022-06-10 DIAGNOSIS — R42 Dizziness and giddiness: Secondary | ICD-10-CM | POA: Diagnosis not present

## 2022-06-10 DIAGNOSIS — R262 Difficulty in walking, not elsewhere classified: Secondary | ICD-10-CM

## 2022-06-10 DIAGNOSIS — R2681 Unsteadiness on feet: Secondary | ICD-10-CM

## 2022-06-10 NOTE — Therapy (Signed)
OUTPATIENT PHYSICAL THERAPY LOWER EXTREMITY TREATMENT   Patient Name: Valerie [name] MRN: [id number] DOB:02/22/1946, 76 y.o., female Today's Date: 06/10/2022   PT End of Session - 06/10/22 1449     Visit Number 8    PT Start Time 4098    PT Stop Time 1191    PT Time Calculation (min) 40 min    Activity Tolerance Patient tolerated treatment well    Behavior During Therapy WFL for tasks assessed/performed                   Past Medical History:  Diagnosis Date   Anemia    Anxiety and depression    Arthritis    Asthma    Cataract    Degenerative arthritis    Depression    Diabetes mellitus, type 2 (HCC)    Fatigue    GERD (gastroesophageal reflux disease)    Glaucoma    Heart palpitations    Hemorrhoids    Hyperlipidemia    Hypertension    Hypothyroidism    Hypoxemia 11/24/2013   Memory deficit 10/04/2013   Obesity    Sleep apnea    BiPAP   Snoring disorder    Past Surgical History:  Procedure Laterality Date   benign tumors resected     CATARACT EXTRACTION Left    FOOT SURGERY Left    bone spur   REFRACTIVE SURGERY     Glaucoma   ROTATOR CUFF REPAIR Left    TUBAL LIGATION     Patient Active Problem List   Diagnosis Date Noted   Encounter for general adult medical examination with abnormal findings 05/05/2022   Stage 3a chronic kidney disease ([place]) 08/26/2021   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 08/06/2021   Tinea corporis 10/01/2020   Degenerative arthritis of right knee 07/02/2020   Moderate episode of recurrent major depressive disorder ([place]) 07/01/2020   Esophageal dysphagia 05/27/2020   Gastroesophageal reflux disease with esophagitis without hemorrhage 05/27/2020   Diuretic-induced hypokalemia 03/27/2020   OAB (overactive bladder) 03/27/2020   Insomnia secondary to chronic pain 02/21/2020   Primary osteoarthritis involving multiple joints 10/10/2019   Thiamine deficiency, unspecified 01/09/2019   Eczema 08/18/2018   Vitamin D  deficiency disease 08/11/2018   Long-term current use of opiate analgesic 06/21/2018   External bleeding hemorrhoids 03/24/2018   Obstructive sleep apnea treated with BiPAP 11/03/2017   Osteopenia 03/28/2014   Insomnia 03/28/2014   Morbid obesity with BMI of 45.0-49.9, adult ([place]) 04/14/2013   Visit for screening mammogram 01/20/2013   Spinal stenosis of lumbar 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 10/05/2012   Hypothyroidism 02/20/2011   Fatty liver disease, nonalcoholic 47/82/9562   Mild intermittent asthma 10/25/2009   Type II diabetes mellitus with manifestations ([place]) 05/06/2009   Hyperlipidemia with target LDL less than 100 05/06/2009   B12 deficiency anemia 05/06/2009   Essential hypertension, benign 05/06/2009   GERD 05/06/2009    PCP: Janith Lima  REFERRING PROVIDER: Dohmeier, Dahlia Bailiff DIAG: ...  THERAPY DIAG:  Dizziness and giddiness  Difficulty in walking, not elsewhere classified  Unsteadiness on feet  Other abnormalities of gait and mobility  Muscle weakness (generalized)  Rationale for Evaluation and Treatment Rehabilitation  ONSET DATE: 04/15/22  SUBJECTIVE:   SUBJECTIVE STATEMENT:  Patient reports that her dizziness continues. She had to cancel her appointment on Monday due to the dizziness, afraid to drive.  PERTINENT HISTORY: She is referred today for dizziness and ataxia , and at high risk of CVA due  to cardiovascular disease, high calcium score, DM, Obesity, asthma and hypercholesterolemia. .   Pt reports almost daily dizziness and has been stumbling. Has had almost falls or falling into something like a sofa or chair. Dr Ronnald Ramp had ordered a  brain MRI, Cerebral volume is not significantly changed since 2014, normal for age. And scattered small foci of nonspecific cerebral white matter  PAIN:  Are you having pain? No  FALLS:  Has patient fallen in last 6 months?  No falls, but multiple near falls.  PATIENT GOALS Identify why she is so dizzy and  resolve it.   OBJECTIVE:   COGNITION:  Overall cognitive status: Within functional limits for tasks assessed    POSTURE: rounded shoulders and forward head   LOWER EXTREMITY ROM: WFL    LOWER EXTREMITY MMT:  MMT Right eval Left eval  Hip flexion 4- 4-  Hip extension    Hip abduction    Hip adduction    Hip internal rotation    Hip external rotation    Knee flexion 4- 4-  Knee extension 4- 4-  Ankle dorsiflexion 4- 4-  Ankle plantarflexion    Ankle inversion    Ankle eversion     (Blank rows = not tested)    FUNCTIONAL TESTS:  5 times sit to stand: 15.37sec Timed up and go (TUG): 22sec Functional gait assessment: 11/30   GAIT: Distance walked: 41' Assistive device utilized: None Level of assistance: SBA Comments: Unsteady, slow, cautious Vestibular Assessment Patient reports that she feels like an air head. No dizziness (although she reported dizziness with testing), eye ROM, eye alignment, Saccades, tracking, convergence all neg. VOR gain + in vertical direction.  Motion Sensitivity Quotient  Intensity: 0 = none, 1 = Lightheaded, 2 = Mild, 3 = Moderate, 4 = Severe, 5 = Vomiting  Intensity  1. Sitting to supine 2  2. Supine to L side 2  3. Supine to R side 2  4. Supine to sitting 3  5. L Hallpike-Dix   6. Up from L    7. R Hallpike-Dix   8. Up from R    9. Sitting, head  tipped to L knee 1  10. Head up from L  knee 0  11. Sitting, head  tipped to R knee 0  12. Head up from R  knee 0  13. Sitting head turns x5 0  14.Sitting head nods x5 0  15. In stance, 180  turn to L  1  16. In stance, 180  turn to R 1     TODAY'S TREATMENT:  06/10/22 Sit to stand x 5 dizziness level 3 start/3 finish Standing rotational reaching in diagonals, 5 x in each direction. Reported dizziness rising from 2 at start, to 4 at finish. Returned down with standing rest break each time. Moved to corner. With back to corner, head turns 2 x 10 x to each side.  Elevated dizziness, with longer time required to return to baseline.   06/01/22 VOR x 2 horizontal- no increased dizziness. Vertical- mildly increased. Gaze stabilization- no increase vert or horizontal Seated forward bend- 10 reps, reaching to 8", then to 4" above floor, mildly elevated symptoms each time, start 2, end 2-3, 1 minute recovery. Standing forward bend to knee height, then reach up overhead, x 10 reps. Increased from level 2-level3 dizziness, unsteady when trying to sit. Standing with back into corner, head turns horizontal 2 x 10 reps, mild dizziness. Sit to stand from chair facing corner for  safety. Very unsteady upon first standing. Instructed to try to stand until she feels stable, then return to sit, 10 reps, required min A to recover balance x 1.  05/27/22 Nu-step L5 x 6 minutes Patient reported dizziness, with mild loss of balance to her R upon first standing. Orthostatic BPs Supine 131/76 HR 88 Sit, Immediate  97/68 92, 3 min  115/77 90, no symptoms Stand, Immed  103/73 94, 3 min 133/88 91  05/20/22  Nustep L5, 22mns  Walking and picking up and placing objects off ground     Half tandem 30s  Full tandem 30s   Full tandem on foam    R- 30s   L- 9s  SLS   R 4, 5, 5   L 0, 3, 7  VORx2 standing horizontal and vertical  Tandem walking    10steps w/o LOB x2   Sidestepping on foam beam, CGA  Narrow walking on foam beam, CGA   05/18/22 Nustep L5, 515ms Cone taps 20 reps  Half tandem (increased sway)   R foot in front 30s    L in front 30s Full Tandem   R in front 20s (LOB)   L in front 30s SLS   R 3, 11   L 2, 5  VORx2 2x15reps Walking with head turns, minA d/t LOB Sidestepping over obstacles w/CGA Standing on foam feet together EO, EC, and half tandem 30sec w/CGA     PATIENT EDUCATION:  Education details: HEP Person educated: Patient Education method: Explanation, Demonstration, Tactile cues, and Verbal cues Education comprehension: verbalized  understanding, returned demonstration, verbal cues required, needs further education, and is sometimes confused   HOME EXERCISE PROGRAM:  [id number]  ASSESSMENT:   CLINICAL IMPRESSION: Patient reports continued unsteadiness. It remains very random. She will sometimes stand or be walking and become unsteady. She is inconsistently performing HEP due to the dizziness. Conintued with habituation exercises to try to improve her dizziness.   OBJECTIVE IMPAIRMENTS Abnormal gait, decreased activity tolerance, decreased balance, decreased coordination, decreased mobility, difficulty walking, decreased strength, dizziness, improper body mechanics, and postural dysfunction.   ACTIVITY LIMITATIONS carrying, lifting, bending, standing, squatting, sleeping, stairs, transfers, and locomotion level  PARTICIPATION LIMITATIONS: meal prep, cleaning  PERSONAL FACTORS Age, Fitness, and Past/current experiences are also affecting patient's functional outcome.   REHAB POTENTIAL: Good  CLINICAL DECISION MAKING: Evolving/moderate complexity  EVALUATION COMPLEXITY: Moderate   GOALS: Goals reviewed with patient? Yes  SHORT TERM GOALS: Target date: 06/03/2022   I with initial HEP Baseline: Goal status: In progress  LONG TERM GOALS: Target date: 07/29/2022   I with final HEP Baseline:  Goal status: INITIAL  2.  Patient will perform TUG in < 12 sec, with no C/O dizziness to decrease fall risk. Baseline:  Goal status: INITIAL  3.  Patient will report no dizziness > 2/10 while performing normal daily activities. Baseline:  Goal status: ongoing  4.  Patient will complete FGA with score of at least 24/30 to demosntrate decreased fall risk. Baseline:  Goal status: INITIAL     PLAN: PT FREQUENCY: 2x/week  PT DURATION: 12 weeks  PLANNED INTERVENTIONS: Therapeutic exercises, Therapeutic activity, Neuromuscular re-education, Balance training, Gait training, Patient/Family education, Joint  mobilization, and Vestibular training  PLAN FOR NEXT SESSION: Initiate HEP, more dynamic balance activities   SuEthel RanaPT 06/10/22 2:52 PM  06/10/2022, 2:52 PM

## 2022-07-06 ENCOUNTER — Other Ambulatory Visit: Payer: Self-pay | Admitting: Internal Medicine

## 2022-07-06 DIAGNOSIS — D509 Iron deficiency anemia, unspecified: Secondary | ICD-10-CM

## 2022-07-16 ENCOUNTER — Other Ambulatory Visit: Payer: Self-pay | Admitting: Internal Medicine

## 2022-07-16 DIAGNOSIS — K2100 Gastro-esophageal reflux disease with esophagitis, without bleeding: Secondary | ICD-10-CM

## 2022-07-21 DIAGNOSIS — M21962 Unspecified acquired deformity of left lower leg: Secondary | ICD-10-CM | POA: Diagnosis not present

## 2022-07-21 DIAGNOSIS — M792 Neuralgia and neuritis, unspecified: Secondary | ICD-10-CM | POA: Diagnosis not present

## 2022-07-21 DIAGNOSIS — E1151 Type 2 diabetes mellitus with diabetic peripheral angiopathy without gangrene: Secondary | ICD-10-CM | POA: Diagnosis not present

## 2022-07-21 DIAGNOSIS — M19071 Primary osteoarthritis, right ankle and foot: Secondary | ICD-10-CM | POA: Diagnosis not present

## 2022-07-21 DIAGNOSIS — M898X7 Other specified disorders of bone, ankle and foot: Secondary | ICD-10-CM | POA: Diagnosis not present

## 2022-07-21 DIAGNOSIS — M2042 Other hammer toe(s) (acquired), left foot: Secondary | ICD-10-CM | POA: Diagnosis not present

## 2022-07-21 DIAGNOSIS — M2041 Other hammer toe(s) (acquired), right foot: Secondary | ICD-10-CM | POA: Diagnosis not present

## 2022-07-21 DIAGNOSIS — M19072 Primary osteoarthritis, left ankle and foot: Secondary | ICD-10-CM | POA: Diagnosis not present

## 2022-07-21 DIAGNOSIS — M722 Plantar fascial fibromatosis: Secondary | ICD-10-CM | POA: Diagnosis not present

## 2022-07-28 ENCOUNTER — Encounter (HOSPITAL_BASED_OUTPATIENT_CLINIC_OR_DEPARTMENT_OTHER): Payer: Self-pay | Admitting: Emergency Medicine

## 2022-07-28 ENCOUNTER — Emergency Department (HOSPITAL_BASED_OUTPATIENT_CLINIC_OR_DEPARTMENT_OTHER)
Admission: EM | Admit: 2022-07-28 | Discharge: 2022-07-28 | Disposition: A | Discharge status: Home / Self Care | Payer: HMO | Attending: Emergency Medicine | Admitting: Emergency Medicine

## 2022-07-28 ENCOUNTER — Other Ambulatory Visit (HOSPITAL_BASED_OUTPATIENT_CLINIC_OR_DEPARTMENT_OTHER): Payer: Self-pay

## 2022-07-28 DIAGNOSIS — N390 Urinary tract infection, site not specified: Secondary | ICD-10-CM | POA: Diagnosis not present

## 2022-07-28 DIAGNOSIS — Z79899 Other long term (current) drug therapy: Secondary | ICD-10-CM | POA: Diagnosis not present

## 2022-07-28 DIAGNOSIS — Z7982 Long term (current) use of aspirin: Secondary | ICD-10-CM | POA: Insufficient documentation

## 2022-07-28 DIAGNOSIS — Z794 Long term (current) use of insulin: Secondary | ICD-10-CM | POA: Diagnosis not present

## 2022-07-28 DIAGNOSIS — E119 Type 2 diabetes mellitus without complications: Secondary | ICD-10-CM | POA: Diagnosis not present

## 2022-07-28 DIAGNOSIS — R300 Dysuria: Secondary | ICD-10-CM | POA: Diagnosis present

## 2022-07-28 DIAGNOSIS — I10 Essential (primary) hypertension: Secondary | ICD-10-CM | POA: Diagnosis not present

## 2022-07-28 DIAGNOSIS — N898 Other specified noninflammatory disorders of vagina: Secondary | ICD-10-CM | POA: Diagnosis not present

## 2022-07-28 DIAGNOSIS — R319 Hematuria, unspecified: Secondary | ICD-10-CM | POA: Diagnosis not present

## 2022-07-28 LAB — URINALYSIS, ROUTINE W REFLEX MICROSCOPIC
Bilirubin Urine: NEGATIVE
Glucose, UA: >1000 mg/dL — AB
Ketones, ur: NEGATIVE mg/dL
Nitrite: NEGATIVE
Specific Gravity, Urine: 1.01 (ref 1.005–1.030)
pH: 6 (ref 5.0–8.0)

## 2022-07-28 MED ORDER — CEPHALEXIN 500 MG PO CAPS
500.0000 mg | ORAL_CAPSULE | Freq: Four times a day (QID) | ORAL | 0 refills | Status: DC
  Filled 2022-07-28: qty 20, 5d supply, fill #0

## 2022-07-28 MED ORDER — METRONIDAZOLE 0.75 % VA GEL
1.0000 | Freq: Every day | VAGINAL | 0 refills | Status: DC
  Filled 2022-07-28: qty 70, 5d supply, fill #0

## 2022-07-28 NOTE — Discharge Instructions (Addendum)
You have been diagnosed with having a urinary tract infection.  Please take antibiotic as prescribed.  You may also use MetroGel vaginal cream to treat for suspected bacterial vaginosis.  Follow-up with your doctor for further care.  Return if you have any concern.

## 2022-07-28 NOTE — ED Triage Notes (Signed)
Dysuria and urinary frequency x 3 days.

## 2022-07-28 NOTE — ED Provider Notes (Signed)
[place] EMERGENCY DEPT Provider Note   CSN: [id number] Arrival date & time: 07/28/22  1432     History  Chief Complaint  Patient presents with   Dysuria    Valerie [name] is a 76 y.o. female.  The history is provided by the patient and medical records. No language interpreter was used.  Dysuria    76 year old female with significant history of diabetes, obesity, GERD, hypertension presenting with urinary symptoms.  Patient report for the past 3 days she noticed increased urinary urgency, frequency and burning on urination.  She also report having some vaginal irritation without any significant odor.  She occasionally noticed tinge of blood in the urine.  She admits that she has been using a new soap to wash herself and may have over washed her genital region.  She is not sexually active.  She denies any fever abdominal pain back pain or vaginal discharge.  No specific treatment tried.  Home Medications Prior to Admission medications   Medication Sig Start Date End Date Taking? Authorizing Provider  albuterol (PROVENTIL) (2.5 MG/3ML) 0.083% nebulizer solution Take 3 mLs (2.5 mg total) by nebulization every 6 (six) hours as needed for wheezing or shortness of breath. 04/13/22   Janith Lima, MD  albuterol (VENTOLIN HFA) 108 (90 Base) MCG/ACT inhaler Inhale 2 puffs into the lungs every 6 (six) hours as needed. For shortness of breath 04/13/22   Janith Lima, MD  aspirin 81 MG tablet Take 81 mg by mouth daily.     [provider]  atorvastatin (LIPITOR) 20 MG tablet TAKE 1 TABLET BY MOUTH EVERY DAY 04/04/22   Janith Lima, MD  budesonide-formoterol [facility]) 80-4.5 MCG/ACT inhaler Inhale 2 puffs into the lungs 2 (two) times daily as needed. Patient taking differently: Inhale 2 puffs into the lungs in the morning and at bedtime. 01/08/22   Janith Lima, MD  carvedilol (COREG) 25 MG tablet TAKE 1 TABLET (25 MG TOTAL) BY MOUTH TWICE A DAY WITH MEALS 05/19/22    Janith Lima, MD  chlorthalidone (HYGROTON) 25 MG tablet Take 1 tablet (25 mg total) by mouth daily. 05/05/22   Janith Lima, MD  Cholecalciferol (VITAMIN D3 SUPER STRENGTH) 50 MCG (2000 UT) TABS Take 1 tablet (2,000 Units total) by mouth daily. 05/05/22   Janith Lima, MD  clobetasol ointment (TEMOVATE) [id number] % Apply 1 application topically 2 (two) times daily. 09/17/20   Janith Lima, MD  famotidine (PEPCID) 40 MG tablet TAKE 1 TABLET BY MOUTH EVERY DAY 07/16/22   Janith Lima, MD  ferrous sulfate 325 (65 FE) MG tablet TAKE 1 TABLET BY MOUTH TWICE DAILY WITH A MEAL 07/06/22   Janith Lima, MD  Finerenone (KERENDIA) 20 MG TABS Take 1 tablet by mouth daily. Via Patient Assistance Psychologist, occupational)    [provider]  gabapentin (NEURONTIN) 300 MG capsule TAKE 1 CAPSULE BY MOUTH THREE TIMES A DAY 07/09/21   Janith Lima, MD  glucose blood (FREESTYLE TEST STRIPS) test strip USE TO TEST BLOOD SUGAR 3 TIMES A DAY. DX: E11.8 02/06/21   Janith Lima, MD  Insulin Pen Needle 31G X 5 MM MISC Use once daily with insulin 01/06/19   Janith Lima, MD  irbesartan (AVAPRO) 300 MG tablet TAKE 0.5 TABLETS BY MOUTH DAILY. 07/09/21   Janith Lima, MD  latanoprost (XALATAN) 0.005 % ophthalmic solution Place 1 drop into both eyes at bedtime.     [provider]  nystatin ointment (MYCOSTATIN) APPLY ON THE SKIN THREE TIMES DAILY 04/25/20   [provider]  oxyCODONE-acetaminophen (PERCOCET/ROXICET) 5-325 MG tablet Take 1 tablet by mouth every 8 (eight) hours as needed for severe pain. 05/05/22   Janith Lima, MD  pantoprazole (PROTONIX) 40 MG tablet TAKE 1 TABLET BY MOUTH 2 TIMES DAILY BEFORE A MEAL 05/04/22   Janith Lima, MD  potassium chloride SA (KLOR-CON M20) 20 MEQ tablet TAKE 1 TABLET BY MOUTH TWICE A DAY 04/04/22   Janith Lima, MD      Allergies    Food and Penicillins    Review of Systems   Review of Systems  Genitourinary:  Positive for dysuria.  All other  systems reviewed and are negative.   Physical Exam Updated Vital Signs BP 131/78 (BP Location: Left Arm)   Pulse 82   Temp 98.5 F (36.9 C) (Oral)   Resp 16   Ht '5\' 6"'$  (1.676 m)   Wt 106.6 kg   SpO2 100%   BMI 37.93 kg/m  Physical Exam Vitals and nursing note reviewed.  Constitutional:      General: She is not in acute distress.    Appearance: She is well-developed. She is obese.  HENT:     Head: Atraumatic.  Eyes:     Conjunctiva/sclera: Conjunctivae normal.  Cardiovascular:     Rate and Rhythm: Normal rate and regular rhythm.  Pulmonary:     Effort: Pulmonary effort is normal.     Breath sounds: Normal breath sounds.  Abdominal:     Palpations: Abdomen is soft.     Tenderness: There is no abdominal tenderness. There is no right CVA tenderness or left CVA tenderness.  Musculoskeletal:     Cervical back: Neck supple.  Skin:    Findings: No rash.  Neurological:     Mental Status: She is alert.  Psychiatric:        Mood and Affect: Mood normal.     ED Results / Procedures / Treatments   Labs (all labs ordered are listed, but only abnormal results are displayed) Labs Reviewed  URINALYSIS, ROUTINE W REFLEX MICROSCOPIC - Abnormal; Notable for the following components:      Result Value   APPearance HAZY (*)    Glucose, UA >1,000 (*)    Hgb urine dipstick LARGE (*)    Protein, ur TRACE (*)    Leukocytes,Ua LARGE (*)    Bacteria, UA RARE (*)    All other components within normal limits    EKG None  Radiology No results found.  Procedures Procedures    Medications Ordered in ED Medications - No data to display  ED Course/ Medical Decision Making/ A&P                           Medical Decision Making  BP 131/78 (BP Location: Left Arm)   Pulse 82   Temp 98.5 F (36.9 C) (Oral)   Resp 16   Ht '5\' 6"'$  (1.676 m)   Wt 106.6 kg   SpO2 100%   BMI 37.93 kg/m   4:55 PM This is a 76 year old female significant history of diabetes, obesity, GERD,  kidney disease, presenting with dysuria.  Patient endorsed having urinary discomfort including urinary frequency, urgency and burning urination with blood in the urine ongoing for the past 3 days.  She also noticed some vaginal irritation.  She reports recent change in her soap and also admits that  she may have "over washed" her genital region.  She mentions she is currently not sexually active since her husband died several years ago.  She mentioned her symptoms felt similar to UTIs she has had in the past.  Overall patient denies any fever chills or any systemic manifestation.  Denies abdominal pain or flank pain.  On exam, patient is well-appearing appears to be in no acute discomfort.  She is afebrile, vital signs stable.  She has a benign abdomen.  She has no CVA tenderness.  Have low suspicion for infected kidney stones, significant abdominal pathology.  Urinalysis obtained and independently viewed interpreted by me and consistent with urinary tract infection.  Will treat with Keflex.  Patient also voiced concerns of vaginal irritation and after discussion, I felt it would be reasonable to prescribe patient MetroGel to treat for potential BV.  I will consider obtaining labs and CT scan of abdomen pelvis but in the setting of a clear source of infection and patient overall well-appearing without systemic manifestation, without additional work-up not indicated.  Stable to be discharged home with antibiotic.  I have considered patient's social determinant of health which including stress and lack of mobility in my plan of care.          Final Clinical Impression(s) / ED Diagnoses Final diagnoses:  Urinary tract infection with hematuria, site unspecified    Rx / DC Orders ED Discharge Orders          Ordered    cephALEXin (KEFLEX) 500 MG capsule  4 times daily        07/28/22 1710    metroNIDAZOLE (METROGEL VAGINAL) 0.75 % vaginal gel  Daily at bedtime        07/28/22 1710               Domenic Moras, PA-C 07/28/22 1711    Trumann, [place] K, DO 07/29/22 0022

## 2022-07-30 ENCOUNTER — Other Ambulatory Visit: Payer: Self-pay | Admitting: Internal Medicine

## 2022-07-30 DIAGNOSIS — I10 Essential (primary) hypertension: Secondary | ICD-10-CM

## 2022-08-01 ENCOUNTER — Other Ambulatory Visit: Payer: Self-pay | Admitting: Internal Medicine

## 2022-08-02 ENCOUNTER — Other Ambulatory Visit: Payer: Self-pay | Admitting: Internal Medicine

## 2022-08-02 DIAGNOSIS — D509 Iron deficiency anemia, unspecified: Secondary | ICD-10-CM

## 2022-08-07 DIAGNOSIS — G479 Sleep disorder, unspecified: Secondary | ICD-10-CM | POA: Diagnosis not present

## 2022-08-07 DIAGNOSIS — G4733 Obstructive sleep apnea (adult) (pediatric): Secondary | ICD-10-CM | POA: Diagnosis not present

## 2022-08-11 NOTE — Progress Notes (Signed)
PATIENT: Valerie [name] DOB: [birth year]  REASON FOR VISIT: follow up HISTORY FROM: patient  Chief Complaint  Patient presents with   Follow-up    Pt alone, rm 3- overall doing well with Bipap. DME [employer] [facility]       HISTORY OF PRESENT ILLNESS:  08/12/22 ALL:  Charmine returns for follow up for OSA on BiPAP. She is doing well on therapy. She is using her machine every night for about 6-7 hours. She denies concerns with machine or supplies.   She was seen by Dr Brett Fairy 04/2022 for dizziness. She was diagnosed with vertigo. She reports that she participated in PT but it was not helpful. She continues to have episodes of dizziness. She feels that the room is spinning when she turns her head.or if she leans forward. She reports a fall about 2 weeks ago. She was sitting in a chair and when she stood up, she got really dizzy then fell on her side. She landed on her sofa. No injuries. She reports BP can be anywhere from 120/70-170-180/90. It fluctuates daily. CBGs are usually 150's. Last A1C 6.3.   She reports that Dr Brett Fairy mentioned to her that we would perform a MOCA at today's visit. I am unable to locate any specific information regarding her memory in the previous note. She states that she can be forgetful at times but no specific memory loss concerns. She manages her home and finances without difficulty. She drives. She is able to perform ADLs and manage medications independently. Her brother has dementia.     08/14/2021 ALL: [name] is a 76 y.o. female here today for follow up for OSA on BiPAP.  She continues to do well on BiPAP therapy. She is using her machine every night. She denies concerns with machine or supplies. She is resting well. She is followed closely by PCP.     HISTORY: (copied from Dr Dohmeier's previous note)  Interval history from 08-14-2020: Valerie [name], a 76 year old AAF , recently widowed, reports that this year has been very hard for  her. She lived with her handicapped son alone, but now her 38 year old granddaughter has joined.  Her husband died with ESRD, hemodialysis, gallstones, Sepsis, and many ,many hospitalizations. He died of ischemic bowel disease. He was a full code. 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 BMI 39.87 kg/m2. Epworth score is 4 points today and her download shows high compliance: 100%  By days and time of use. 10 hours on average, 13/9 cm water. AHI : 0.0/h. High air leaks.    REVIEW OF SYSTEMS: Out of a complete 14 system review of symptoms, the patient complains only of the following symptoms, fatigue, intermittent lightheadedness/dizziness, forgetfulness and all other reviewed systems are negative.  ESS: 2/24, previously 3/24  ALLERGIES: Allergies  Allergen Reactions   Food Swelling    bananas   Penicillins Swelling and Rash    HOME MEDICATIONS: Outpatient Medications Prior to Visit  Medication Sig Dispense Refill   albuterol (PROVENTIL) (2.5 MG/3ML) 0.083% nebulizer solution Take 3 mLs (2.5 mg total) by nebulization every 6 (six) hours as needed for wheezing or shortness of breath. 75 mL 3   albuterol (VENTOLIN HFA) 108 (90 Base) MCG/ACT inhaler Inhale 2 puffs into the lungs every 6 (six) hours as needed. For shortness of breath 3 each 4   aspirin 81 MG tablet Take 81 mg by mouth daily.  atorvastatin (LIPITOR) 20 MG tablet TAKE 1 TABLET BY MOUTH EVERY DAY 90 tablet 1   budesonide-formoterol (SYMBICORT) 80-4.5 MCG/ACT inhaler Inhale 2 puffs into the lungs 2 (two) times daily as needed. (Patient taking differently: Inhale 2 puffs into the lungs in the morning and at bedtime.) 3 each 1   carvedilol (COREG) 25 MG tablet TAKE 1 TABLET BY MOUTH TWICE A DAY WITH MEALS 180 tablet 0   chlorthalidone (HYGROTON) 25 MG tablet TAKE 1 TABLET (25 MG TOTAL) BY MOUTH DAILY. 90 tablet 0   Cholecalciferol (VITAMIN D3 SUPER STRENGTH) 50 MCG (2000 UT) TABS Take 1 tablet  (2,000 Units total) by mouth daily. 90 tablet 1   clobetasol ointment (TEMOVATE) [id number] % Apply 1 application topically 2 (two) times daily. 60 g 1   famotidine (PEPCID) 40 MG tablet TAKE 1 TABLET BY MOUTH EVERY DAY 90 tablet 1   ferrous sulfate 325 (65 FE) MG tablet TAKE 1 TABLET BY MOUTH 3 TIMES DAILY WITH MEALS. 270 tablet 0   Finerenone (KERENDIA) 20 MG TABS Take 1 tablet by mouth daily. Via Patient Assistance Psychologist, occupational)     gabapentin (NEURONTIN) 300 MG capsule TAKE 1 CAPSULE BY MOUTH THREE TIMES A DAY 270 capsule 0   glucose blood (FREESTYLE TEST STRIPS) test strip USE TO TEST BLOOD SUGAR 3 TIMES A DAY. DX: E11.8 300 strip 1   Insulin Pen Needle 31G X 5 MM MISC Use once daily with insulin 100 each 3   irbesartan (AVAPRO) 300 MG tablet TAKE 1/2 TABLET BY MOUTH EVERY DAY 45 tablet 1   latanoprost (XALATAN) 0.005 % ophthalmic solution Place 1 drop into both eyes at bedtime.      nystatin ointment (MYCOSTATIN) APPLY ON THE SKIN THREE TIMES DAILY     oxyCODONE-acetaminophen (PERCOCET/ROXICET) 5-325 MG tablet Take 1 tablet by mouth every 8 (eight) hours as needed for severe pain. 90 tablet 0   pantoprazole (PROTONIX) 40 MG tablet TAKE 1 TABLET BY MOUTH 2 TIMES DAILY BEFORE A MEAL 180 tablet 0   potassium chloride SA (KLOR-CON M20) 20 MEQ tablet TAKE 1 TABLET BY MOUTH TWICE A DAY 180 tablet 1   cephALEXin (KEFLEX) 500 MG capsule Take 1 capsule (500 mg total) by mouth 4 (four) times daily. 20 capsule 0   metroNIDAZOLE (METROGEL VAGINAL) 0.75 % vaginal gel Place 1 Applicatorful vaginally at bedtime. 70 g 0   No facility-administered medications prior to visit.    PAST MEDICAL HISTORY: Past Medical History:  Diagnosis Date   Anemia    Anxiety and depression    Arthritis    Asthma    Cataract    Degenerative arthritis    Depression    Diabetes mellitus, type 2 (HCC)    Fatigue    GERD (gastroesophageal reflux disease)    Glaucoma    Heart palpitations    Hemorrhoids    Hyperlipidemia     Hypertension    Hypothyroidism    Hypoxemia 11/24/2013   Memory deficit 10/04/2013   Obesity    Sleep apnea    BiPAP   Snoring disorder     PAST SURGICAL HISTORY: Past Surgical History:  Procedure Laterality Date   benign tumors resected     CATARACT EXTRACTION Left    FOOT SURGERY Left    bone spur   REFRACTIVE SURGERY     Glaucoma   ROTATOR CUFF REPAIR Left    TUBAL LIGATION      FAMILY HISTORY: Family History  Problem Relation Age of  Onset   Alcohol abuse Mother    Heart attack Mother    Coronary artery disease Brother    Heart attack Brother    Hypertension Brother    Hyperlipidemia Brother    Heart attack Father    Hypertension Father    Hyperlipidemia Father    Heart disease Sister    Atrial fibrillation Sister    Hypertension Sister    Hyperlipidemia Sister    Hypertension Other        family history   Alcohol abuse Other    Colon cancer Brother 70   Hypertension Brother    Hyperlipidemia Brother    Dementia Brother    Hypertension Brother    Hyperlipidemia Brother    Diabetes Son    Hypertension Son    Hyperlipidemia Son    Diabetes Son    Hypertension Son    Hyperlipidemia Son    Cerebral palsy Son    Hypertension Son    Hyperlipidemia Son     SOCIAL HISTORY: Social History   Socioeconomic History   Marital status: Widowed    Spouse name: Not on file   Number of children: 3   Years of education: 11th   Highest education level: 11th grade  Occupational History   Occupation: disabled  Tobacco Use   Smoking status: Never   Smokeless tobacco: Never  Vaping Use   Vaping Use: Never used  Substance and Sexual Activity   Alcohol use: No    Alcohol/week: 0.0 standard drinks of alcohol   Drug use: No   Sexual activity: Not Currently    Comment: not working, lives with husband, 3 sons  Other Topics Concern   Not on file  Social History Narrative   Client's spouse recently passed Feb 2021      Lives with 2 Son and 1 granddaughter   R  handed   Caffeine: ocas., has cut back and drinking more water   Social Determinants of Health   Financial Resource Strain: Low Risk  (02/27/2021)   Overall Financial Resource Strain (CARDIA)    Difficulty of Paying Living Expenses: Not hard at all  Food Insecurity: No Food Insecurity (02/27/2021)   Hunger Vital Sign    Worried About Running Out of Food in the Last Year: Never true    Ran Out of Food in the Last Year: Never true  Transportation Needs: Unknown (03/18/2020)   PRAPARE - Hydrologist (Medical): No    Lack of Transportation (Non-Medical): Not on file  Physical Activity: Inactive (02/27/2021)   Exercise Vital Sign    Days of Exercise per Week: 0 days    Minutes of Exercise per Session: 0 min  Stress: Stress Concern Present (02/27/2021)   [place]    Feeling of Stress : Rather much  Social Connections: Moderately Integrated (02/27/2021)   Social Connection and Isolation Panel [NHANES]    Frequency of Communication with Friends and Family: More than three times a week    Frequency of Social Gatherings with Friends and Family: Once a week    Attends Religious Services: 1 to 4 times per year    Active Member of [employer] or Organizations: No    Attends Archivist Meetings: 1 to 4 times per year    Marital Status: Widowed  Intimate Partner Violence: Not At Risk (03/18/2020)   Humiliation, Afraid, Rape, and Kick questionnaire    Fear of Current or Ex-Partner:  No    Emotionally Abused: No    Physically Abused: No    Sexually Abused: No     PHYSICAL EXAM  Vitals:   08/12/22 1406  BP: 133/80  Pulse: 79  Weight: 230 lb (104.3 kg)  Height: '5\' 6"'$  (1.676 m)    Body mass index is 37.12 kg/m.  Generalized: Well developed, in no acute distress  Cardiology: normal rate and rhythm, no murmur noted Respiratory: clear to auscultation bilaterally  Neurological examination   Mentation: Alert oriented to time, place, history taking. Follows all commands speech and language fluent Cranial nerve II-XII: Pupils were equal round reactive to light. Extraocular movements were full, visual field were full  Motor: The motor testing reveals 5 over 5 strength of all 4 extremities. Good symmetric motor tone is noted throughout.  Coordination: finger to nose and heel to shin intact, bilaterally.  Gait and station: Pushes to standing position. Gait is stable with cane. Tandem not attempted.    DIAGNOSTIC DATA (LABS, IMAGING, TESTING) - I reviewed patient records, labs, notes, testing and imaging myself where available.     04/29/2015   11:57 AM  MMSE - Mini Mental State Exam  Not completed: Unable to complete       08/12/2022    2:32 PM  Montreal Cognitive Assessment   Visuospatial/ Executive (0/5) 2  Naming (0/3) 3  Attention: Read list of digits (0/2) 1  Attention: Read list of letters (0/1) 1  Attention: Serial 7 subtraction starting at 100 (0/3) 1  Language: Repeat phrase (0/2) 2  Language : Fluency (0/1) 0  Abstraction (0/2) 2  Delayed Recall (0/5) 3  Orientation (0/6) 6  Total 21     Lab Results  Component Value Date   WBC 8.4 05/05/2022   HGB 11.0 (L) 05/05/2022   HCT 32.9 (L) 05/05/2022   MCV 88.9 05/05/2022   PLT 339.0 05/05/2022      Component Value Date/Time   NA 136 05/05/2022 1356   NA 143 03/30/2022 1007   K 3.9 05/05/2022 1356   CL 97 05/05/2022 1356   CO2 29 05/05/2022 1356   GLUCOSE 120 (H) 05/05/2022 1356   BUN 19 05/05/2022 1356   BUN 14 03/30/2022 1007   CREATININE 1.09 05/05/2022 1356   CALCIUM 9.9 05/05/2022 1356   PROT 7.5 03/09/2022 1035   ALBUMIN 4.3 03/09/2022 1035   AST 14 03/09/2022 1035   ALT 15 03/09/2022 1035   ALKPHOS 100 03/09/2022 1035   BILITOT 0.5 03/09/2022 1035   GFRNONAA >60 08/29/2015 0630   GFRAA >60 08/29/2015 0630   Lab Results  Component Value Date   CHOL 141 05/05/2022   HDL 47.60 05/05/2022    LDLCALC 56 05/05/2022   LDLDIRECT 42.0 04/10/2021   TRIG 186.0 (H) 05/05/2022   CHOLHDL 3 05/05/2022   Lab Results  Component Value Date   HGBA1C 6.5 05/05/2022   Lab Results  Component Value Date   VITAMINB12 263 05/05/2022   Lab Results  Component Value Date   TSH 2.61 03/09/2022     ASSESSMENT AND PLAN 76 y.o. year old female  has a past medical history of Anemia, Anxiety and depression, Arthritis, Asthma, Cataract, Degenerative arthritis, Depression, Diabetes mellitus, type 2 ([place]), Fatigue, GERD (gastroesophageal reflux disease), Glaucoma, Heart palpitations, Hemorrhoids, Hyperlipidemia, Hypertension, Hypothyroidism, Hypoxemia (11/24/2013), Memory deficit (10/04/2013), Obesity, Sleep apnea, and Snoring disorder. here with     ICD-10-CM   1. Obstructive sleep apnea treated with BiPAP  G47.33  2. Altered gait  R26.9     3. Dizziness  R42     4. Forgetfulness  R68.89       KINLEA GEHO is doing well on BiPAP therapy. Received last machine 2021. Compliance report reveals excellent daily compliance and optimal four hour usage. She was encouraged to continue using BiPAP nightly and for greater than 4 hours each night. We will update supply orders as indicated. Risks of untreated sleep apnea review and education materials provided. She continues to have regular episodes of dizziness with a fall about two weeks ago. We have discussed need for close blood pressure monitoring with PCP as she states home readings fluctuate daily from 120-190 over 60-90's. She is drinking around 64-80 ounces of water daily. CBGs are well controlled. PT did not help. MRI in 2022 was unremarkable. She is taking gabapentin '300mg'$  TID and oxycodone sparingly but have been on these for years. MOCA was 21/30, however, pateint was distracted by answering her phone during exam. May need to perform MMSE in future if MOCA reamins low. She denies any significant memory loss. I will have her return to see Dr  Brett Fairy as scheduled in 10/2022. May consider ENT referral if needed. Healthy lifestyle habits encouraged. She verbalizes understanding and agreement with this plan.    No orders of the defined types were placed in this encounter.     No orders of the defined types were placed in this encounter.      Debbora Presto, FNP-C 08/12/2022, 3:25 PM [facility] [address] [place], [place] [address] [phone]

## 2022-08-11 NOTE — Patient Instructions (Addendum)
Please continue using your BiPAP regularly. While your insurance requires that you use BiPAP at least 4 hours each night on 70% of the nights, I recommend, that you not skip any nights and use it throughout the night if you can. Getting used to BiPAP and staying with the treatment long term does take time and patience and discipline. Untreated obstructive sleep apnea when it is moderate to severe can have an adverse impact on cardiovascular health and raise her risk for heart disease, arrhythmias, hypertension, congestive heart failure, stroke and diabetes. Untreated obstructive sleep apnea causes sleep disruption, nonrestorative sleep, and sleep deprivation. This can have an impact on your day to day functioning and cause daytime sleepiness and impairment of cognitive function, memory loss, mood disturbance, and problems focussing. Using BiPAP regularly can improve these symptoms.  Keep an eye on your blood pressure and blood sugars at home. Continue discussion with Dr Ronnald Ramp.May consider referral to ENT if dizziness continues. Make sure to drink at least 50-60 ounces of water daily.   Follow up with Dr Brett Fairy in November as scheduled.   Management of Memory Problems   There are some general things you can do to help manage your memory problems.  Your memory may not in fact recover, but by using techniques and strategies you will be able to manage your memory difficulties better.   1)  Establish a routine. Try to establish and then stick to a regular routine.  By doing this, you will get used to what to expect and you will reduce the need to rely on your memory.  Also, try to do things at the same time of day, such as taking your medication or checking your calendar first thing in the morning. Think about think that you can do as a part of a regular routine and make a list.  Then enter them into a daily planner to remind you.  This will help you establish a routine.   2)  Organize your  environment. Organize your environment so that it is uncluttered.  Decrease visual stimulation.  Place everyday items such as keys or cell phone in the same place every day (ie.  Basket next to front door) Use post it notes with a brief message to yourself (ie. Turn off light, lock the door) Use labels to indicate where things go (ie. Which cupboards are for food, dishes, etc.) Keep a notepad and pen by the telephone to take messages   3)  Memory Aids A diary or journal/notebook/daily planner Making a list (shopping list, chore list, to do list that needs to be done) Using an alarm as a reminder (kitchen timer or cell phone alarm) Using cell phone to store information (Notes, Calendar, Reminders) Calendar/White board placed in a prominent position Post-it notes   In order for memory aids to be useful, you need to have good habits.  It's no good remembering to make a note in your journal if you don't remember to look in it.  Try setting aside a certain time of day to look in journal.   4)  Improving mood and managing fatigue. There may be other factors that contribute to memory difficulties.  Factors, such as anxiety, depression and tiredness can affect memory. Regular gentle exercise can help improve your mood and give you more energy. Simple relaxation techniques may help relieve symptoms of anxiety Try to get back to completing activities or hobbies you enjoyed doing in the past. Learn to pace yourself through activities to  decrease fatigue. Find out about some local support groups where you can share experiences with others. Try and achieve 7-8 hours of sleep at night.

## 2022-08-12 ENCOUNTER — Ambulatory Visit: Payer: HMO | Admitting: Family Medicine

## 2022-08-12 ENCOUNTER — Encounter: Payer: Self-pay | Admitting: Family Medicine

## 2022-08-12 VITALS — BP 133/80 | HR 79 | Ht 66.0 in | Wt 230.0 lb

## 2022-08-12 DIAGNOSIS — R42 Dizziness and giddiness: Secondary | ICD-10-CM

## 2022-08-12 DIAGNOSIS — R6889 Other general symptoms and signs: Secondary | ICD-10-CM | POA: Diagnosis not present

## 2022-08-12 DIAGNOSIS — R269 Unspecified abnormalities of gait and mobility: Secondary | ICD-10-CM

## 2022-08-12 DIAGNOSIS — G4733 Obstructive sleep apnea (adult) (pediatric): Secondary | ICD-10-CM

## 2022-08-20 ENCOUNTER — Ambulatory Visit: Payer: HMO | Admitting: Family Medicine

## 2022-08-26 DIAGNOSIS — H33193 Other retinoschisis and retinal cysts, bilateral: Secondary | ICD-10-CM | POA: Diagnosis not present

## 2022-08-26 DIAGNOSIS — E119 Type 2 diabetes mellitus without complications: Secondary | ICD-10-CM | POA: Diagnosis not present

## 2022-08-26 DIAGNOSIS — H35033 Hypertensive retinopathy, bilateral: Secondary | ICD-10-CM | POA: Diagnosis not present

## 2022-08-26 DIAGNOSIS — Z961 Presence of intraocular lens: Secondary | ICD-10-CM | POA: Diagnosis not present

## 2022-08-26 DIAGNOSIS — H401132 Primary open-angle glaucoma, bilateral, moderate stage: Secondary | ICD-10-CM | POA: Diagnosis not present

## 2022-08-26 LAB — HM DIABETES EYE EXAM

## 2022-09-02 ENCOUNTER — Telehealth: Payer: Self-pay | Admitting: Neurology

## 2022-09-02 NOTE — Telephone Encounter (Signed)
LVM asking pt to call back to reschedule 11/8 appointment - MD out

## 2022-09-08 ENCOUNTER — Encounter: Payer: Self-pay | Admitting: Internal Medicine

## 2022-09-08 ENCOUNTER — Ambulatory Visit (INDEPENDENT_AMBULATORY_CARE_PROVIDER_SITE_OTHER): Payer: HMO | Admitting: Internal Medicine

## 2022-09-08 VITALS — BP 132/82 | HR 75 | Temp 98.0°F | Resp 16 | Ht 66.0 in | Wt 233.0 lb

## 2022-09-08 DIAGNOSIS — E039 Hypothyroidism, unspecified: Secondary | ICD-10-CM | POA: Diagnosis not present

## 2022-09-08 DIAGNOSIS — E559 Vitamin D deficiency, unspecified: Secondary | ICD-10-CM | POA: Diagnosis not present

## 2022-09-08 DIAGNOSIS — E785 Hyperlipidemia, unspecified: Secondary | ICD-10-CM

## 2022-09-08 DIAGNOSIS — J4520 Mild intermittent asthma, uncomplicated: Secondary | ICD-10-CM | POA: Diagnosis not present

## 2022-09-08 DIAGNOSIS — E118 Type 2 diabetes mellitus with unspecified complications: Secondary | ICD-10-CM

## 2022-09-08 DIAGNOSIS — I10 Essential (primary) hypertension: Secondary | ICD-10-CM

## 2022-09-08 DIAGNOSIS — M48061 Spinal stenosis, lumbar region without neurogenic claudication: Secondary | ICD-10-CM

## 2022-09-08 DIAGNOSIS — D510 Vitamin B12 deficiency anemia due to intrinsic factor deficiency: Secondary | ICD-10-CM | POA: Diagnosis not present

## 2022-09-08 DIAGNOSIS — Z23 Encounter for immunization: Secondary | ICD-10-CM | POA: Diagnosis not present

## 2022-09-08 DIAGNOSIS — N1831 Chronic kidney disease, stage 3a: Secondary | ICD-10-CM | POA: Diagnosis not present

## 2022-09-08 LAB — URINALYSIS, ROUTINE W REFLEX MICROSCOPIC
Bilirubin Urine: NEGATIVE
Hgb urine dipstick: NEGATIVE
Ketones, ur: NEGATIVE
Leukocytes,Ua: NEGATIVE
Nitrite: NEGATIVE
Specific Gravity, Urine: <=1.005 — AB (ref 1.000–1.030)
Total Protein, Urine: NEGATIVE
Urine Glucose: >=1000 — AB
Urobilinogen, UA: 0.2 (ref 0.0–1.0)
pH: 6 (ref 5.0–8.0)

## 2022-09-08 LAB — FOLATE: Folate: 8.3 ng/mL (ref >5.9)

## 2022-09-08 LAB — CBC WITH DIFFERENTIAL/PLATELET
Basophils Absolute: 0.1 10*3/uL (ref 0.0–0.1)
Basophils Relative: 0.7 % (ref 0.0–3.0)
Eosinophils Absolute: 0.4 10*3/uL (ref 0.0–0.7)
Eosinophils Relative: 4.1 % (ref 0.0–5.0)
HCT: 33.8 % — ABNORMAL LOW (ref 36.0–46.0)
Hemoglobin: 11.4 g/dL — ABNORMAL LOW (ref 12.0–15.0)
Lymphocytes Relative: 40 % (ref 12.0–46.0)
Lymphs Abs: 3.9 10*3/uL (ref 0.7–4.0)
MCHC: 33.7 g/dL (ref 30.0–36.0)
MCV: 89.2 fl (ref 78.0–100.0)
Monocytes Absolute: 1.1 10*3/uL — ABNORMAL HIGH (ref 0.1–1.0)
Monocytes Relative: 11.5 % (ref 3.0–12.0)
Neutro Abs: 4.3 10*3/uL (ref 1.4–7.7)
Neutrophils Relative %: 43.7 % (ref 43.0–77.0)
Platelets: 322 10*3/uL (ref 150.0–400.0)
RBC: 3.78 Mil/uL — ABNORMAL LOW (ref 3.87–5.11)
RDW: 16.4 % — ABNORMAL HIGH (ref 11.5–15.5)
WBC: 9.7 10*3/uL (ref 4.0–10.5)

## 2022-09-08 LAB — BASIC METABOLIC PANEL
BUN: 19 mg/dL (ref 6–23)
CO2: 29 mEq/L (ref 19–32)
Calcium: 10.1 mg/dL (ref 8.4–10.5)
Chloride: 96 mEq/L (ref 96–112)
Creatinine, Ser: 1.14 mg/dL (ref 0.40–1.20)
GFR: 47.42 mL/min — ABNORMAL LOW (ref >60.00)
Glucose, Bld: 103 mg/dL — ABNORMAL HIGH (ref 70–99)
Potassium: 4 mEq/L (ref 3.5–5.1)
Sodium: 135 mEq/L (ref 135–145)

## 2022-09-08 LAB — TSH: TSH: 3.02 u[IU]/mL (ref 0.35–5.50)

## 2022-09-08 LAB — MICROALBUMIN / CREATININE URINE RATIO
Creatinine,U: 30.8 mg/dL
Microalb Creat Ratio: 2.3 mg/g (ref 0.0–30.0)
Microalb, Ur: <0.7 mg/dL (ref 0.0–1.9)

## 2022-09-08 LAB — VITAMIN B12: Vitamin B-12: 176 pg/mL — ABNORMAL LOW (ref 211–911)

## 2022-09-08 MED ORDER — BUDESONIDE-FORMOTEROL FUMARATE 80-4.5 MCG/ACT IN AERO: 2.0000 | INHALATION_SPRAY | Freq: Two times a day (BID) | RESPIRATORY_TRACT | 1 refills | Status: AC | PRN

## 2022-09-08 MED ORDER — IRBESARTAN 300 MG PO TABS: 150.0000 mg | ORAL_TABLET | Freq: Every day | ORAL | 1 refills | Status: DC

## 2022-09-08 MED ORDER — GABAPENTIN 300 MG PO CAPS: ORAL_CAPSULE | ORAL | 1 refills | Status: AC

## 2022-09-08 MED ORDER — ALBUTEROL SULFATE HFA 108 (90 BASE) MCG/ACT IN AERS: 2.0000 | INHALATION_SPRAY | Freq: Four times a day (QID) | RESPIRATORY_TRACT | 4 refills | Status: AC | PRN

## 2022-09-08 MED ORDER — VITAMIN D3 SUPER STRENGTH 50 MCG (2000 UT) PO TABS: 1.0000 | ORAL_TABLET | Freq: Every day | ORAL | 1 refills | Status: DC

## 2022-09-08 MED ORDER — FREESTYLE LIBRE 2 SENSOR MISC: 1.0000 | Freq: Every day | 5 refills | Status: DC

## 2022-09-08 MED ORDER — FREESTYLE LIBRE 2 READER DEVI: 1.0000 | Freq: Every day | 5 refills | Status: DC

## 2022-09-08 MED ORDER — ATORVASTATIN CALCIUM 20 MG PO TABS: 20.0000 mg | ORAL_TABLET | Freq: Every day | ORAL | 1 refills | Status: DC

## 2022-09-08 NOTE — Patient Instructions (Signed)
                                                                    [email or web address]                       

## 2022-09-08 NOTE — Progress Notes (Signed)
Subjective:  Patient ID: Valerie [name], female    DOB: October 20, 1946  Age: 76 y.o. MRN: [id number]  CC: Diabetes, Anemia, Hypertension, and Hypothyroidism   HPI TMYA WIGINGTON presents for f/up -  She is active and denies chest pain, shortness of breath, diaphoresis, or edema.  Outpatient Medications Prior to Visit  Medication Sig Dispense Refill   albuterol (PROVENTIL) (2.5 MG/3ML) 0.083% nebulizer solution Take 3 mLs (2.5 mg total) by nebulization every 6 (six) hours as needed for wheezing or shortness of breath. 75 mL 3   aspirin 81 MG tablet Take 81 mg by mouth daily.      carvedilol (COREG) 25 MG tablet TAKE 1 TABLET BY MOUTH TWICE A DAY WITH MEALS 180 tablet 0   chlorthalidone (HYGROTON) 25 MG tablet TAKE 1 TABLET (25 MG TOTAL) BY MOUTH DAILY. 90 tablet 0   clobetasol ointment (TEMOVATE) 7.68 % Apply 1 application topically 2 (two) times daily. 60 g 1   famotidine (PEPCID) 40 MG tablet TAKE 1 TABLET BY MOUTH EVERY DAY 90 tablet 1   ferrous sulfate 325 (65 FE) MG tablet TAKE 1 TABLET BY MOUTH 3 TIMES DAILY WITH MEALS. 270 tablet 0   Finerenone (KERENDIA) 20 MG TABS Take 1 tablet by mouth daily. Via [employer], occupational)     glucose blood (FREESTYLE TEST STRIPS) test strip USE TO TEST BLOOD SUGAR 3 TIMES A DAY. DX: E11.8 300 strip 1   Insulin Pen Needle 31G X 5 MM MISC Use once daily with insulin 100 each 3   latanoprost (XALATAN) 0.005 % ophthalmic solution Place 1 drop into both eyes at bedtime.      nystatin ointment (MYCOSTATIN) APPLY ON THE SKIN THREE TIMES DAILY     oxyCODONE-acetaminophen (PERCOCET/ROXICET) 5-325 MG tablet Take 1 tablet by mouth every 8 (eight) hours as needed for severe pain. 90 tablet 0   pantoprazole (PROTONIX) 40 MG tablet TAKE 1 TABLET BY MOUTH 2 TIMES DAILY BEFORE A MEAL 180 tablet 0   potassium chloride SA (KLOR-CON M20) 20 MEQ tablet TAKE 1 TABLET BY MOUTH TWICE A DAY 180 tablet 1   albuterol (VENTOLIN HFA) 108 (90 Base) MCG/ACT inhaler Inhale 2  puffs into the lungs every 6 (six) hours as needed. For shortness of breath 3 each 4   atorvastatin (LIPITOR) 20 MG tablet TAKE 1 TABLET BY MOUTH EVERY DAY 90 tablet 1   budesonide-formoterol (SYMBICORT) 80-4.5 MCG/ACT inhaler Inhale 2 puffs into the lungs 2 (two) times daily as needed. (Patient taking differently: Inhale 2 puffs into the lungs in the morning and at bedtime.) 3 each 1   Cholecalciferol (VITAMIN D3 SUPER STRENGTH) 50 MCG (2000 UT) TABS Take 1 tablet (2,000 Units total) by mouth daily. 90 tablet 1   gabapentin (NEURONTIN) 300 MG capsule TAKE 1 CAPSULE BY MOUTH THREE TIMES A DAY 270 capsule 0   irbesartan (AVAPRO) 300 MG tablet TAKE 1/2 TABLET BY MOUTH EVERY DAY 45 tablet 1   No facility-administered medications prior to visit.    ROS Review of Systems  Constitutional:  Positive for unexpected weight change (wt gain). Negative for appetite change, chills, diaphoresis and fatigue.  HENT: Negative.    Eyes: Negative.   Respiratory:  Negative for cough, chest tightness, shortness of breath and wheezing.   Cardiovascular:  Negative for chest pain, palpitations and leg swelling.  Gastrointestinal:  Negative for abdominal pain, constipation, diarrhea, nausea and vomiting.  Endocrine: Negative.   Genitourinary: Negative.  Negative for difficulty urinating.  Musculoskeletal:  Positive for back pain. Negative for arthralgias and myalgias.  Skin: Negative.   Neurological:  Negative for dizziness, weakness and light-headedness.  Hematological:  Negative for adenopathy. Does not bruise/bleed easily.  Psychiatric/Behavioral: Negative.      Objective:  BP 132/82 (BP Location: Left Arm, Patient Position: Sitting, Cuff Size: Large)   Pulse 75   Temp 98 F (36.7 C) (Oral)   Resp 16   Ht '5\' 6"'$  (1.676 m)   Wt 233 lb (105.7 kg)   SpO2 98%   BMI 37.61 kg/m   BP Readings from Last 3 Encounters:  09/08/22 132/82  08/12/22 133/80  07/28/22 131/78    Wt Readings from Last 3  Encounters:  09/08/22 233 lb (105.7 kg)  08/12/22 230 lb (104.3 kg)  07/28/22 235 lb (106.6 kg)    Physical Exam Vitals reviewed.  Constitutional:      Appearance: She is obese.  HENT:     Mouth/Throat:     Mouth: Mucous membranes are moist.  Eyes:     General: No scleral icterus.    Conjunctiva/sclera: Conjunctivae normal.  Cardiovascular:     Rate and Rhythm: Normal rate and regular rhythm.     Heart sounds: No murmur heard. Pulmonary:     Effort: Pulmonary effort is normal.     Breath sounds: No stridor. No wheezing, rhonchi or rales.  Abdominal:     General: Abdomen is flat.     Palpations: There is no mass.     Tenderness: There is no abdominal tenderness. There is no guarding.     Hernia: No hernia is present.  Musculoskeletal:        General: Normal range of motion.     Cervical back: Neck supple.     Right lower leg: No edema.     Left lower leg: No edema.  Lymphadenopathy:     Cervical: No cervical adenopathy.  Skin:    General: Skin is warm and dry.  Neurological:     General: No focal deficit present.     Mental Status: She is alert.  Psychiatric:        Mood and Affect: Mood normal.        Behavior: Behavior normal.     Lab Results  Component Value Date   WBC 9.7 09/08/2022   HGB 11.4 (L) 09/08/2022   HCT 33.8 (L) 09/08/2022   PLT 322.0 09/08/2022   GLUCOSE 103 (H) 09/08/2022   CHOL 141 05/05/2022   TRIG 186.0 (H) 05/05/2022   HDL 47.60 05/05/2022   LDLDIRECT 42.0 04/10/2021   LDLCALC 56 05/05/2022   ALT 15 03/09/2022   AST 14 03/09/2022   NA 135 09/08/2022   K 4.0 09/08/2022   CL 96 09/08/2022   CREATININE 1.14 09/08/2022   BUN 19 09/08/2022   CO2 29 09/08/2022   TSH 3.02 09/08/2022   INR 1.1 (H) 04/10/2021   HGBA1C 6.5 05/05/2022   MICROALBUR <0.7 09/08/2022    No results found.  Assessment & Plan:   Moet was seen today for diabetes, anemia, hypertension and hypothyroidism.  Diagnoses and all orders for this  visit:  Essential hypertension, benign- Her blood pressure is adequately well controlled. -     Urinalysis, Routine w reflex microscopic; Future -     Basic metabolic panel; Future -     irbesartan (AVAPRO) 300 MG tablet; Take 0.5 tablets (150 mg total) by mouth daily. -     Basic metabolic panel -  Urinalysis, Routine w reflex microscopic  Acquired hypothyroidism- She is euthyroid. -     TSH; Future -     TSH  Type II diabetes mellitus with manifestations ([place])- Her blood sugar is adequately well controlled. -     Microalbumin / creatinine urine ratio; Future -     Basic metabolic panel; Future -     Continuous Blood Gluc Sensor (FREESTYLE LIBRE 2 SENSOR) MISC; 1 Act by Does not apply route daily. -     Continuous Blood Gluc Receiver (FREESTYLE LIBRE 2 READER) DEVI; 1 Act by Does not apply route daily. -     HM Diabetes Foot Exam -     Basic metabolic panel -     Microalbumin / creatinine urine ratio  Stage 3a chronic kidney disease ([place])- Her renal function is stable. -     Urinalysis, Routine w reflex microscopic; Future -     Microalbumin / creatinine urine ratio; Future -     Basic metabolic panel; Future -     Basic metabolic panel -     Microalbumin / creatinine urine ratio -     Urinalysis, Routine w reflex microscopic  Vitamin B12 deficiency anemia due to intrinsic factor deficiency- Will restart parenteral B12 replacement therapy. -     CBC with Differential/Platelet; Future -     Folate; Future -     Vitamin B12; Future -     Vitamin B12 -     Folate -     CBC with Differential/Platelet  Hyperlipidemia with target LDL less than 100 -     atorvastatin (LIPITOR) 20 MG tablet; Take 1 tablet (20 mg total) by mouth daily.  Mild intermittent asthma without complication -     budesonide-formoterol (SYMBICORT) 80-4.5 MCG/ACT inhaler; Inhale 2 puffs into the lungs 2 (two) times daily as needed. -     albuterol (VENTOLIN HFA) 108 (90 Base) MCG/ACT inhaler; Inhale 2  puffs into the lungs every 6 (six) hours as needed. For shortness of breath  Vitamin D deficiency disease -     Cholecalciferol (VITAMIN D3 SUPER STRENGTH) 50 MCG (2000 UT) TABS; Take 1 tablet (2,000 Units total) by mouth daily.  Spinal stenosis of lumbar 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 -     gabapentin (NEURONTIN) 300 MG capsule; TAKE 1 CAPSULE BY MOUTH THREE TIMES A DAY  Flu vaccine need -     Flu Vaccine QUAD High Dose(Fluad)   I have changed Joshlyn A. Cavagnaro's atorvastatin and irbesartan. I am also having her start on FreeStyle Libre 2 Sensor and [employer] 2 Reader. Additionally, I am having her maintain her latanoprost, aspirin, Insulin Pen Needle, nystatin ointment, clobetasol ointment, FREESTYLE TEST STRIPS, potassium chloride SA, Kerendia, albuterol, oxyCODONE-acetaminophen, famotidine, chlorthalidone, carvedilol, pantoprazole, ferrous sulfate, budesonide-formoterol, Vitamin D3 Super Strength, gabapentin, and albuterol.  Meds ordered this encounter  Medications   atorvastatin (LIPITOR) 20 MG tablet    Sig: Take 1 tablet (20 mg total) by mouth daily.    Dispense:  90 tablet    Refill:  1   budesonide-formoterol (SYMBICORT) 80-4.5 MCG/ACT inhaler    Sig: Inhale 2 puffs into the lungs 2 (two) times daily as needed.    Dispense:  3 each    Refill:  1   Cholecalciferol (VITAMIN D3 SUPER STRENGTH) 50 MCG (2000 UT) TABS    Sig: Take 1 tablet (2,000 Units total) by mouth daily.    Dispense:  90 tablet    Refill:  1  gabapentin (NEURONTIN) 300 MG capsule    Sig: TAKE 1 CAPSULE BY MOUTH THREE TIMES A DAY    Dispense:  270 capsule    Refill:  1   irbesartan (AVAPRO) 300 MG tablet    Sig: Take 0.5 tablets (150 mg total) by mouth daily.    Dispense:  45 tablet    Refill:  1    DX Code Needed  .   albuterol (VENTOLIN HFA) 108 (90 Base) MCG/ACT inhaler    Sig: Inhale 2 puffs into the lungs every 6 (six) hours as needed. For shortness of breath    Dispense:  3 each    Refill:  4    Continuous Blood Gluc Sensor (FREESTYLE LIBRE 2 SENSOR) MISC    Sig: 1 Act by Does not apply route daily.    Dispense:  2 each    Refill:  5   Continuous Blood Gluc Receiver (FREESTYLE LIBRE 2 READER) DEVI    Sig: 1 Act by Does not apply route daily.    Dispense:  2 each    Refill:  5     Follow-up: Return in about 4 months (around 01/08/2023).  [name], MD

## 2022-09-11 ENCOUNTER — Other Ambulatory Visit: Payer: Self-pay

## 2022-09-11 ENCOUNTER — Telehealth: Payer: Self-pay

## 2022-09-11 NOTE — Telephone Encounter (Signed)
While discussing pts B12 level she mentioned that 2 medications that she is taking is not on her list.   Synjardy 12.5-'500mg'$  Take 2 tabs daily by mouth  Trulicity 6.2-2.6JF Inject once weekly.  I have added these 2 medications to her chart.

## 2022-09-14 DIAGNOSIS — Z7985 Long-term (current) use of injectable non-insulin antidiabetic drugs: Secondary | ICD-10-CM | POA: Diagnosis not present

## 2022-09-14 DIAGNOSIS — Z7984 Long term (current) use of oral hypoglycemic drugs: Secondary | ICD-10-CM | POA: Diagnosis not present

## 2022-09-14 DIAGNOSIS — N1831 Chronic kidney disease, stage 3a: Secondary | ICD-10-CM | POA: Diagnosis not present

## 2022-09-14 DIAGNOSIS — E1122 Type 2 diabetes mellitus with diabetic chronic kidney disease: Secondary | ICD-10-CM | POA: Diagnosis not present

## 2022-09-16 ENCOUNTER — Ambulatory Visit (INDEPENDENT_AMBULATORY_CARE_PROVIDER_SITE_OTHER): Payer: HMO

## 2022-09-16 DIAGNOSIS — D510 Vitamin B12 deficiency anemia due to intrinsic factor deficiency: Secondary | ICD-10-CM | POA: Diagnosis not present

## 2022-09-16 MED ORDER — CYANOCOBALAMIN 1000 MCG/ML IJ SOLN
1000.0000 ug | Freq: Once | INTRAMUSCULAR | Status: AC
  Administered 2022-09-16: 1000 ug via INTRAMUSCULAR

## 2022-09-16 NOTE — Progress Notes (Signed)
After obtaining consent, and per orders of Dr. Jones, injection of B12 given in the right deltoid by Duong Haydel P Karmelo Bass. Patient instructed to report any adverse reaction to me immediately.  

## 2022-09-23 ENCOUNTER — Telehealth: Payer: Self-pay

## 2022-09-23 ENCOUNTER — Ambulatory Visit: Payer: HMO

## 2022-09-23 ENCOUNTER — Ambulatory Visit (INDEPENDENT_AMBULATORY_CARE_PROVIDER_SITE_OTHER): Payer: HMO | Admitting: *Deleted

## 2022-09-23 DIAGNOSIS — D510 Vitamin B12 deficiency anemia due to intrinsic factor deficiency: Secondary | ICD-10-CM

## 2022-09-23 MED ORDER — CYANOCOBALAMIN 1000 MCG/ML IJ SOLN
1000.0000 ug | Freq: Once | INTRAMUSCULAR | Status: AC
  Administered 2022-09-23: 1000 ug via INTRAMUSCULAR

## 2022-09-23 NOTE — Telephone Encounter (Signed)
Patient states she is returning a call from someone at the office, states Jarrett Soho was supposed to call her back about her B12 shots.

## 2022-09-23 NOTE — Progress Notes (Addendum)
Administered B12 shot left deltoid . Pt tolerated well. 2nd B12 out 4

## 2022-09-24 NOTE — Telephone Encounter (Signed)
I think she means Valerie [name]? I have not spoken to this pt and looks like you saw her for nurse visit yesterday.

## 2022-09-24 NOTE — Telephone Encounter (Signed)
Notified pt--(2) more left for B12 shot needed to be weekly and afterwards would be monthly

## 2022-09-30 ENCOUNTER — Ambulatory Visit (INDEPENDENT_AMBULATORY_CARE_PROVIDER_SITE_OTHER): Payer: HMO | Admitting: *Deleted

## 2022-09-30 DIAGNOSIS — D510 Vitamin B12 deficiency anemia due to intrinsic factor deficiency: Secondary | ICD-10-CM | POA: Diagnosis not present

## 2022-09-30 MED ORDER — CYANOCOBALAMIN 1000 MCG/ML IJ SOLN
1000.0000 ug | Freq: Once | INTRAMUSCULAR | Status: AC
  Administered 2022-09-30: 1000 ug via INTRAMUSCULAR

## 2022-09-30 NOTE — Progress Notes (Signed)
Administered B12 1000 mcg/ml left deltoid. Pt tolerated well. 

## 2022-10-05 ENCOUNTER — Telehealth: Payer: Self-pay | Admitting: Neurology

## 2022-10-05 ENCOUNTER — Ambulatory Visit: Payer: HMO | Admitting: Neurology

## 2022-10-05 NOTE — Telephone Encounter (Signed)
Pt is calling. Stated she is sick and need to cancel appointment for today.

## 2022-10-07 ENCOUNTER — Telehealth: Payer: HMO

## 2022-10-07 ENCOUNTER — Ambulatory Visit (INDEPENDENT_AMBULATORY_CARE_PROVIDER_SITE_OTHER): Payer: HMO

## 2022-10-07 DIAGNOSIS — E538 Deficiency of other specified B group vitamins: Secondary | ICD-10-CM

## 2022-10-07 MED ORDER — CYANOCOBALAMIN 1000 MCG/ML IJ SOLN
1000.0000 ug | Freq: Once | INTRAMUSCULAR | Status: AC
  Administered 2022-10-07: 1000 ug via INTRAMUSCULAR

## 2022-10-07 NOTE — Progress Notes (Signed)
After obtaining consent, and per orders of Dr. Ronnald Ramp, injection of B12 was given the right deltoid by Marrian Salvage. Patient instructed to report any adverse reaction to me immediately.

## 2022-10-08 ENCOUNTER — Telehealth: Payer: Self-pay | Admitting: Internal Medicine

## 2022-10-08 NOTE — Telephone Encounter (Signed)
Unable ot reach pt, line busy and unable to leave a  message for patient to call back to schedule Medicare Annual Wellness Visit   Last AWV  02/27/21  Please schedule at anytime with [facility] if patient calls the office back.      Any questions, please call me at [phone]

## 2022-10-12 ENCOUNTER — Telehealth: Payer: Self-pay

## 2022-10-12 NOTE — Telephone Encounter (Signed)
Pt has been informed that a copy of her income is needed and pt portion would need to be completed prior faxing.  She expressed understanding and would do complete it on Weds.

## 2022-10-14 ENCOUNTER — Telehealth: Payer: Self-pay

## 2022-10-14 LAB — HM MAMMOGRAPHY

## 2022-10-14 NOTE — Telephone Encounter (Signed)
Pt has completed packet today with income verification.   Forms has been faxed back to [employer] with successful fax verification.

## 2022-10-14 NOTE — Telephone Encounter (Signed)
[employer] application with pt income verification has been completed and faxed back with successful confirmation.

## 2022-10-21 ENCOUNTER — Ambulatory Visit: Payer: HMO | Admitting: Neurology

## 2022-10-21 ENCOUNTER — Other Ambulatory Visit: Payer: Self-pay | Admitting: Internal Medicine

## 2022-10-21 DIAGNOSIS — E876 Hypokalemia: Secondary | ICD-10-CM

## 2022-10-21 DIAGNOSIS — I10 Essential (primary) hypertension: Secondary | ICD-10-CM

## 2022-10-25 ENCOUNTER — Other Ambulatory Visit: Payer: Self-pay | Admitting: Internal Medicine

## 2022-10-30 ENCOUNTER — Telehealth: Payer: Self-pay | Admitting: Internal Medicine

## 2022-10-30 NOTE — Telephone Encounter (Signed)
N/A unable to leave a message for patient to call back to schedule Medicare Annual Wellness Visit   Last AWV  02/27/21  Please schedule at anytime with [facility] if patient calls the office back.      Any questions, please call me at [phone]

## 2022-11-09 ENCOUNTER — Encounter: Payer: Self-pay | Admitting: Internal Medicine

## 2022-11-09 ENCOUNTER — Ambulatory Visit (INDEPENDENT_AMBULATORY_CARE_PROVIDER_SITE_OTHER): Payer: HMO | Admitting: Internal Medicine

## 2022-11-09 ENCOUNTER — Telehealth: Payer: Self-pay | Admitting: Internal Medicine

## 2022-11-09 VITALS — BP 138/80 | HR 74 | Temp 98.1°F | Resp 16 | Ht 66.0 in | Wt 232.0 lb

## 2022-11-09 DIAGNOSIS — M7989 Other specified soft tissue disorders: Secondary | ICD-10-CM | POA: Insufficient documentation

## 2022-11-09 DIAGNOSIS — E538 Deficiency of other specified B group vitamins: Secondary | ICD-10-CM

## 2022-11-09 DIAGNOSIS — M159 Polyosteoarthritis, unspecified: Secondary | ICD-10-CM | POA: Diagnosis not present

## 2022-11-09 DIAGNOSIS — M79662 Pain in left lower leg: Secondary | ICD-10-CM | POA: Diagnosis not present

## 2022-11-09 DIAGNOSIS — E118 Type 2 diabetes mellitus with unspecified complications: Secondary | ICD-10-CM

## 2022-11-09 DIAGNOSIS — M150 Primary generalized (osteo)arthritis: Secondary | ICD-10-CM

## 2022-11-09 DIAGNOSIS — M48061 Spinal stenosis, lumbar region without neurogenic claudication: Secondary | ICD-10-CM | POA: Diagnosis not present

## 2022-11-09 DIAGNOSIS — N1831 Chronic kidney disease, stage 3a: Secondary | ICD-10-CM

## 2022-11-09 LAB — D-DIMER, QUANTITATIVE: D-Dimer, Quant: 0.54 mcg/mL FEU — ABNORMAL HIGH (ref <0.50)

## 2022-11-09 MED ORDER — CYANOCOBALAMIN 1000 MCG/ML IJ SOLN
1000.0000 ug | Freq: Once | INTRAMUSCULAR | Status: AC
  Administered 2022-11-09: 1000 ug via INTRAMUSCULAR

## 2022-11-09 MED ORDER — OXYCODONE-ACETAMINOPHEN 5-325 MG PO TABS: 1.0000 | ORAL_TABLET | Freq: Three times a day (TID) | ORAL | 0 refills | Status: DC | PRN

## 2022-11-09 MED ORDER — KERENDIA 20 MG PO TABS: 1.0000 | ORAL_TABLET | Freq: Every day | ORAL | 3 refills | Status: DC

## 2022-11-09 NOTE — Telephone Encounter (Signed)
Patient wanted to know if she needs to schedule another nurse visit for a B12 shot since the one she got today was her 4 of 4 b12 shot.

## 2022-11-09 NOTE — Progress Notes (Signed)
Subjective:  Patient ID: Valerie [name], female    DOB: [birth year]  Age: 76 y.o. MRN: [id number]  CC: Osteoarthritis and Back Pain   HPI Valerie [name] presents for f/up -  She complains of a 1 week history of nontraumatic left lower extremity pain and swelling.  The swelling extends onto the dorsum of her left foot.  She denies claudication.  Outpatient Medications Prior to Visit  Medication Sig Dispense Refill   albuterol (PROVENTIL) (2.5 MG/3ML) 0.083% nebulizer solution Take 3 mLs (2.5 mg total) by nebulization every 6 (six) hours as needed for wheezing or shortness of breath. 75 mL 3   albuterol (VENTOLIN HFA) 108 (90 Base) MCG/ACT inhaler Inhale 2 puffs into the lungs every 6 (six) hours as needed. For shortness of breath 3 each 4   aspirin 81 MG tablet Take 81 mg by mouth daily.      atorvastatin (LIPITOR) 20 MG tablet Take 1 tablet (20 mg total) by mouth daily. 90 tablet 1   budesonide-formoterol (SYMBICORT) 80-4.5 MCG/ACT inhaler Inhale 2 puffs into the lungs 2 (two) times daily as needed. 3 each 1   carvedilol (COREG) 25 MG tablet TAKE 1 TABLET BY MOUTH TWICE A DAY WITH MEALS 180 tablet 0   chlorthalidone (HYGROTON) 25 MG tablet TAKE 1 TABLET (25 MG TOTAL) BY MOUTH DAILY. 90 tablet 0   Cholecalciferol (VITAMIN D3 SUPER STRENGTH) 50 MCG (2000 UT) TABS Take 1 tablet (2,000 Units total) by mouth daily. 90 tablet 1   clobetasol ointment (TEMOVATE) 3.15 % Apply 1 application topically 2 (two) times daily. 60 g 1   Continuous Blood Gluc Receiver (FREESTYLE LIBRE 2 READER) DEVI 1 Act by Does not apply route daily. 2 each 5   Continuous Blood Gluc Sensor (FREESTYLE LIBRE 2 SENSOR) MISC 1 Act by Does not apply route daily. 2 each 5   Dulaglutide (TRULICITY) 1.5 VV/6.1YW SOPN Inject into the skin once a week.     Empagliflozin-metFORMIN HCl (SYNJARDY) 12.5-500 MG TABS Take by mouth daily. Take 2 tablets daily.     famotidine (PEPCID) 40 MG tablet TAKE 1 TABLET BY MOUTH EVERY DAY 90  tablet 1   ferrous sulfate 325 (65 FE) MG tablet TAKE 1 TABLET BY MOUTH 3 TIMES DAILY WITH MEALS. 270 tablet 0   gabapentin (NEURONTIN) 300 MG capsule TAKE 1 CAPSULE BY MOUTH THREE TIMES A DAY 270 capsule 1   glucose blood (FREESTYLE TEST STRIPS) test strip USE TO TEST BLOOD SUGAR 3 TIMES A DAY. DX: E11.8 300 strip 1   Insulin Pen Needle 31G X 5 MM MISC Use once daily with insulin 100 each 3   irbesartan (AVAPRO) 300 MG tablet Take 0.5 tablets (150 mg total) by mouth daily. 45 tablet 1   latanoprost (XALATAN) 0.005 % ophthalmic solution Place 1 drop into both eyes at bedtime.      nystatin ointment (MYCOSTATIN) APPLY ON THE SKIN THREE TIMES DAILY     pantoprazole (PROTONIX) 40 MG tablet TAKE 1 TABLET BY MOUTH 2 TIMES DAILY BEFORE A MEAL 180 tablet 0   potassium chloride SA (KLOR-CON M20) 20 MEQ tablet TAKE 1 TABLET BY MOUTH TWICE A DAY 180 tablet 1   Finerenone (KERENDIA) 20 MG TABS Take 1 tablet by mouth daily. Via Patient Assistance Psychologist, occupational)     oxyCODONE-acetaminophen (PERCOCET/ROXICET) 5-325 MG tablet Take 1 tablet by mouth every 8 (eight) hours as needed for severe pain. 90 tablet 0   No facility-administered medications prior to visit.  ROS Review of Systems  Constitutional: Negative.  Negative for diaphoresis and fatigue.  HENT: Negative.    Eyes: Negative.   Respiratory:  Negative for cough, chest tightness, shortness of breath and wheezing.   Cardiovascular:  Positive for leg swelling. Negative for chest pain and palpitations.  Gastrointestinal:  Negative for abdominal pain, diarrhea, nausea and vomiting.  Endocrine: Negative.   Genitourinary: Negative.  Negative for difficulty urinating.  Musculoskeletal:  Positive for arthralgias and back pain. Negative for joint swelling and myalgias.  Skin: Negative.   Allergic/Immunologic: Negative.   Neurological: Negative.  Negative for dizziness and weakness.  Hematological:  Negative for adenopathy. Does not bruise/bleed easily.   Psychiatric/Behavioral: Negative.      Objective:  BP 138/80 (BP Location: Right Arm, Patient Position: Sitting, Cuff Size: Large)   Pulse 74   Temp 98.1 F (36.7 C) (Oral)   Resp 16   Ht '5\' 6"'$  (1.676 m)   Wt 232 lb (105.2 kg)   SpO2 98%   BMI 37.45 kg/m   BP Readings from Last 3 Encounters:  11/09/22 138/80  09/08/22 132/82  08/12/22 133/80    Wt Readings from Last 3 Encounters:  11/09/22 232 lb (105.2 kg)  09/08/22 233 lb (105.7 kg)  08/12/22 230 lb (104.3 kg)    Physical Exam Vitals reviewed.  HENT:     Mouth/Throat:     Mouth: Mucous membranes are moist.  Eyes:     General: No scleral icterus.    Conjunctiva/sclera: Conjunctivae normal.  Cardiovascular:     Rate and Rhythm: Normal rate and regular rhythm.     Heart sounds: No murmur heard. Pulmonary:     Effort: Pulmonary effort is normal.     Breath sounds: No stridor. No wheezing, rhonchi or rales.  Abdominal:     General: Abdomen is flat.     Tenderness: There is no abdominal tenderness. There is no guarding.     Hernia: No hernia is present.  Musculoskeletal:        General: Deformity (DJD) present. No swelling or tenderness.     Cervical back: Neck supple.     Right knee: Normal.     Left knee: Deformity (DJD) present. No swelling or bony tenderness. Normal range of motion.     Right lower leg: Edema (trace pitting) present.     Left lower leg: Edema (trace pitting) present.  Skin:    General: Skin is warm and dry.  Neurological:     General: No focal deficit present.     Mental Status: She is alert.  Psychiatric:        Mood and Affect: Mood normal.        Behavior: Behavior normal.     Lab Results  Component Value Date   WBC 9.7 09/08/2022   HGB 11.4 (L) 09/08/2022   HCT 33.8 (L) 09/08/2022   PLT 322.0 09/08/2022   GLUCOSE 103 (H) 09/08/2022   CHOL 141 05/05/2022   TRIG 186.0 (H) 05/05/2022   HDL 47.60 05/05/2022   LDLDIRECT 42.0 04/10/2021   LDLCALC 56 05/05/2022   ALT 15  03/09/2022   AST 14 03/09/2022   NA 135 09/08/2022   K 4.0 09/08/2022   CL 96 09/08/2022   CREATININE 1.14 09/08/2022   BUN 19 09/08/2022   CO2 29 09/08/2022   TSH 3.02 09/08/2022   INR 1.1 (H) 04/10/2021   HGBA1C 6.5 05/05/2022   MICROALBUR <0.7 09/08/2022    No results found.  Assessment &  Plan:   Valerie [name] was seen today for osteoarthritis and back pain.  Diagnoses and all orders for this visit:  Pain and swelling of left lower leg- Exam is reassuring.  D-dimer is normal for her age.  There is no evidence of deep venous thrombosis or Baker's cyst. -     D-dimer, quantitative; Future -     D-dimer, quantitative  Spinal stenosis of lumbar 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 -     oxyCODONE-acetaminophen (PERCOCET/ROXICET) 5-325 MG tablet; Take 1 tablet by mouth every 8 (eight) hours as needed for severe pain.  Primary osteoarthritis involving multiple joints -     oxyCODONE-acetaminophen (PERCOCET/ROXICET) 5-325 MG tablet; Take 1 tablet by mouth every 8 (eight) hours as needed for severe pain.  Type II diabetes mellitus with manifestations (HCC) -     Finerenone (KERENDIA) 20 MG TABS; Take 1 tablet by mouth daily. Via Patient Assistance Psychologist, occupational)  Stage 3a chronic kidney disease ([place]) -     Finerenone (KERENDIA) 20 MG TABS; Take 1 tablet by mouth daily. Via Patient Assistance Psychologist, occupational)  B12 deficiency -     cyanocobalamin (VITAMIN B12) injection 1,000 mcg  Other orders -     Discontinue: Finerenone (KERENDIA) 20 MG TABS; Take 1 tablet by mouth daily. Via Patient Assistance Psychologist, occupational)   I am having Valerie [name] maintain her latanoprost, aspirin, Insulin Pen Needle, nystatin ointment, clobetasol ointment, FREESTYLE TEST STRIPS, albuterol, famotidine, chlorthalidone, carvedilol, ferrous sulfate, atorvastatin, budesonide-formoterol, Vitamin D3 Super Strength, gabapentin, irbesartan, albuterol, FreeStyle Libre 2 Sensor, FreeStyle [place] 2 Reader, [place], Trulicity, potassium chloride SA,  pantoprazole, oxyCODONE-acetaminophen, and Kerendia. We administered cyanocobalamin.  Meds ordered this encounter  Medications   DISCONTD: Finerenone (KERENDIA) 20 MG TABS    Sig: Take 1 tablet by mouth daily. Via Patient Assistance Psychologist, occupational)    Dispense:  90 tablet    Refill:  3   oxyCODONE-acetaminophen (PERCOCET/ROXICET) 5-325 MG tablet    Sig: Take 1 tablet by mouth every 8 (eight) hours as needed for severe pain.    Dispense:  90 tablet    Refill:  0   Finerenone (KERENDIA) 20 MG TABS    Sig: Take 1 tablet by mouth daily. Via Patient Assistance Psychologist, occupational)    Dispense:  90 tablet    Refill:  3   cyanocobalamin (VITAMIN B12) injection 1,000 mcg     Follow-up: Return in about 3 months (around 02/09/2023).  [name], MD

## 2022-11-09 NOTE — Patient Instructions (Signed)
Osteoarthritis  Osteoarthritis is a type of arthritis. It refers to joint pain or joint disease. Osteoarthritis affects tissue that covers the ends of bones in joints (cartilage). Cartilage acts as a cushion between the bones and helps them move smoothly. Osteoarthritis occurs when cartilage in the joints gets worn down. Osteoarthritis is sometimes called "wear and tear" arthritis. Osteoarthritis is the most common form of arthritis. It often occurs in older people. It is a condition that gets worse over time. The joints most often affected by this condition are in the fingers, toes, hips, knees, and spine, including the neck and lower back. What are the causes? This condition is caused by the wearing down of cartilage that covers the ends of bones. What increases the risk? The following factors may make you more likely to develop this condition: Being age 50 or older. Obesity. Overuse of joints. Past injury of a joint. Past surgery on a joint. Family history of osteoarthritis. What are the signs or symptoms? The main symptoms of this condition are pain, swelling, and stiffness in the joint. Other symptoms may include: An enlarged joint. More pain and further damage caused by small pieces of bone or cartilage that break off and float inside of the joint. Small deposits of bone (osteophytes) that grow on the edges of the joint. A grating or scraping feeling inside the joint when you move it. Popping or creaking sounds when you move. Difficulty walking or exercising. An inability to grip items, twist your hand(s), or control the movements of your hands and fingers. How is this diagnosed? This condition may be diagnosed based on: Your medical history. A physical exam. Your symptoms. X-rays of the affected joint(s). Blood tests to rule out other types of arthritis. How is this treated? There is no cure for this condition, but treatment can help control pain and improve joint function.  Treatment may include a combination of therapies, such as: Pain relief techniques, such as: Applying heat and cold to the joint. Massage. A form of talk therapy called cognitive behavioral therapy (CBT). This therapy helps you set goals and follow up on the changes that you make. Medicines for pain and inflammation. The medicines can be taken by mouth or applied to the skin. They include: NSAIDs, such as ibuprofen. Prescription medicines. Strong anti-inflammatory medicines (corticosteroids). Certain nutritional supplements. A prescribed exercise program. You may work with a physical therapist. Assistive devices, such as a brace, wrap, splint, specialized glove, or cane. A weight control plan. Surgery, such as: An osteotomy. This is done to reposition the bones and relieve pain or to remove loose pieces of bone and cartilage. Joint replacement surgery. You may need this surgery if you have advanced osteoarthritis. Follow these instructions at home: Activity Rest your affected joints as told by your health care provider. Exercise as told by your health care provider. He or she may recommend specific types of exercise, such as: Strengthening exercises. These are done to strengthen the muscles that support joints affected by arthritis. Aerobic activities. These are exercises, such as brisk walking or water aerobics, that increase your heart rate. Range-of-motion activities. These help your joints move more easily. Balance and agility exercises. Managing pain, stiffness, and swelling     If directed, apply heat to the affected area as often as told by your health care provider. Use the heat source that your health care provider recommends, such as a moist heat pack or a heating pad. If you have a removable assistive device, remove it   as told by your health care provider. Place a towel between your skin and the heat source. If your health care provider tells you to keep the assistive device  on while you apply heat, place a towel between the assistive device and the heat source. Leave the heat on for 20-30 minutes. Remove the heat if your skin turns bright red. This is especially important if you are unable to feel pain, heat, or cold. You may have a greater risk of getting burned. If directed, put ice on the affected area. To do this: If you have a removable assistive device, remove it as told by your health care provider. Put ice in a plastic bag. Place a towel between your skin and the bag. If your health care provider tells you to keep the assistive device on during icing, place a towel between the assistive device and the bag. Leave the ice on for 20 minutes, 2-3 times a day. Move your fingers or toes often to reduce stiffness and swelling. Raise (elevate) the injured area above the level of your heart while you are sitting or lying down. General instructions Take over-the-counter and prescription medicines only as told by your health care provider. Maintain a healthy weight. Follow instructions from your health care provider for weight control. Do not use any products that contain nicotine or tobacco, such as cigarettes, e-cigarettes, and chewing tobacco. If you need help quitting, ask your health care provider. Use assistive devices as told by your health care provider. Keep all follow-up visits as told by your health care provider. This is important. Where to find more information National Institute of Arthritis and Musculoskeletal and Skin Diseases: www.niams.nih.gov National Institute on Aging: www.nia.nih.gov American College of Rheumatology: www.rheumatology.org Contact a health care provider if: You have redness, swelling, or a feeling of warmth in a joint that gets worse. You have a fever along with joint or muscle aches. You develop a rash. You have trouble doing your normal activities. Get help right away if: You have pain that gets worse and is not relieved by  pain medicine. Summary Osteoarthritis is a type of arthritis that affects tissue covering the ends of bones in joints (cartilage). This condition is caused by the wearing down of cartilage that covers the ends of bones. The main symptom of this condition is pain, swelling, and stiffness in the joint. There is no cure for this condition, but treatment can help control pain and improve joint function. This information is not intended to replace advice given to you by your health care provider. Make sure you discuss any questions you have with your health care provider. Document Revised: 06/02/2022 Document Reviewed: 11/27/2019 Elsevier Patient Education  2023 [employer]vier Inc.  

## 2022-11-10 ENCOUNTER — Ambulatory Visit: Payer: HMO

## 2022-11-10 NOTE — Telephone Encounter (Signed)
Pt has been informed and has scheduled a NV for B12 in Jan (6week mark)

## 2022-12-01 ENCOUNTER — Other Ambulatory Visit: Payer: Self-pay | Admitting: Internal Medicine

## 2022-12-01 DIAGNOSIS — I10 Essential (primary) hypertension: Secondary | ICD-10-CM

## 2022-12-02 ENCOUNTER — Ambulatory Visit: Payer: HMO | Admitting: Cardiovascular Disease

## 2022-12-23 ENCOUNTER — Ambulatory Visit (INDEPENDENT_AMBULATORY_CARE_PROVIDER_SITE_OTHER): Payer: PPO | Admitting: *Deleted

## 2022-12-23 DIAGNOSIS — E538 Deficiency of other specified B group vitamins: Secondary | ICD-10-CM | POA: Diagnosis not present

## 2022-12-23 MED ORDER — CYANOCOBALAMIN 1000 MCG/ML IJ SOLN
1000.0000 ug | Freq: Once | INTRAMUSCULAR | Status: AC
  Administered 2022-12-23: 1000 ug via INTRAMUSCULAR

## 2022-12-23 NOTE — Progress Notes (Signed)
Pls cosign for B12 inj../lmb  

## 2022-12-29 ENCOUNTER — Ambulatory Visit: Payer: PPO | Attending: Cardiovascular Disease | Admitting: Cardiovascular Disease

## 2023-01-06 ENCOUNTER — Other Ambulatory Visit: Payer: Self-pay | Admitting: Internal Medicine

## 2023-01-06 DIAGNOSIS — K2100 Gastro-esophageal reflux disease with esophagitis, without bleeding: Secondary | ICD-10-CM

## 2023-01-06 DIAGNOSIS — I10 Essential (primary) hypertension: Secondary | ICD-10-CM

## 2023-01-12 ENCOUNTER — Other Ambulatory Visit: Payer: Self-pay | Admitting: Internal Medicine

## 2023-01-12 ENCOUNTER — Encounter: Payer: Self-pay | Admitting: Cardiovascular Disease

## 2023-01-12 ENCOUNTER — Ambulatory Visit: Payer: PPO | Attending: Cardiovascular Disease | Admitting: Cardiovascular Disease

## 2023-01-12 VITALS — BP 128/76 | HR 81 | Ht 66.0 in | Wt 226.4 lb

## 2023-01-12 DIAGNOSIS — G4733 Obstructive sleep apnea (adult) (pediatric): Secondary | ICD-10-CM | POA: Diagnosis not present

## 2023-01-12 DIAGNOSIS — I10 Essential (primary) hypertension: Secondary | ICD-10-CM

## 2023-01-12 DIAGNOSIS — E785 Hyperlipidemia, unspecified: Secondary | ICD-10-CM

## 2023-01-12 DIAGNOSIS — R931 Abnormal findings on diagnostic imaging of heart and coronary circulation: Secondary | ICD-10-CM

## 2023-01-12 NOTE — Assessment & Plan Note (Signed)
Coronary CTA performed 04/02/2022 revealed a coronary calcium score of 454 with mild nonobstructive CAD.

## 2023-01-12 NOTE — Assessment & Plan Note (Signed)
History of hyperlipidemia on statin therapy with lipid profile performed 05/05/2022 revealing total cholesterol 141, LDL 56 and HDL of 47.

## 2023-01-12 NOTE — Assessment & Plan Note (Signed)
History of obstructive sleep apnea on BiPAP

## 2023-01-12 NOTE — Assessment & Plan Note (Signed)
History of essential hypertension blood pressure measured today at 128/76.  She is on carvedilol, chlorthalidone and Avapro.

## 2023-01-12 NOTE — Patient Instructions (Signed)
Medication Instructions:  Your physician recommends that you continue on your current medications as directed. Please refer to the Current Medication list given to you today.  *If you need a refill on your cardiac medications before your next appointment, please call your pharmacy*   Follow-Up: At [facility], you and your health needs are our priority.  As part of our continuing mission to provide you with exceptional heart care, we have created designated Provider Care Teams.  These Care Teams include your primary Cardiologist (physician) and Advanced Practice Providers (APPs -  Physician Assistants and Nurse Practitioners) who all work together to provide you with the care you need, when you need it.  We recommend signing up for the patient portal called "MyChart".  Sign up information is provided on this After Visit Summary.  MyChart is used to connect with patients for Virtual Visits (Telemedicine).  Patients are able to view lab/test results, encounter notes, upcoming appointments, etc.  Non-urgent messages can be sent to your provider as well.   To learn more about what you can do with MyChart, go to [email or web address].    Your next appointment:   12 month(s)  Provider:   Quay Burow, MD

## 2023-01-12 NOTE — Progress Notes (Signed)
01/12/2023 Valerie [name]   September 28, 1946  [id number]  Primary Physician Valerie Lima, MD Primary Cardiologist: Valerie Harp MD Valerie [name], [place]  HPI:  Valerie [name] is a 77 y.o.  morbidly overweight married African-American female mother 43, grandmother of 5 grandchildren who was in the textile industry and was referred by Dr. Scarlette [name] for cardiovascular evaluation because of palpitations.  I last  saw her in the office 11/14/2021.  She has been evaluated by Drs. Valerie [name] and Valerie [name] in the past.  She saw Dr. Oval [name] a little over 3 years ago which time she had an echo that was essentially normal and a 2-day protocol Myoview that was normal as well.  She does have a history of treated hypertension, diabetes and hyperlipidemia.  She wears BiPAP for obstructive sleep apnea and his family history for heart disease with both father and brother who died of myocardial infarctions.  He is never had a heart attack or stroke.  She does get chronic chest pain every several days and is chronically short of breath.  She also has reactive airways disease.  He started having palpitations several months ago and these occur several times a week lasting seconds to minutes at a time.   She has had a Myoview stress test and 2D echo all of which were normal.  She also had a monitor placed because of palpitations 10/03/2018 that showed sinus rhythm with occasional PVCs and PACs.    Since I saw her a year ago she has remained stable.  She still gets occasional palpitations.  She had a coronary CTA performed 04/02/2022 revealing a coronary calcium score of 454 with mild nonobstructive CAD.   Current Meds  Medication Sig   albuterol (PROVENTIL) (2.5 MG/3ML) 0.083% nebulizer solution Take 3 mLs (2.5 mg total) by nebulization every 6 (six) hours as needed for wheezing or shortness of breath.   albuterol (VENTOLIN HFA) 108 (90 Base) MCG/ACT inhaler Inhale 2 puffs into the lungs every 6 (six) hours as  needed. For shortness of breath   aspirin 81 MG tablet Take 81 mg by mouth daily.    atorvastatin (LIPITOR) 20 MG tablet Take 1 tablet (20 mg total) by mouth daily.   budesonide-formoterol (SYMBICORT) 80-4.5 MCG/ACT inhaler Inhale 2 puffs into the lungs 2 (two) times daily as needed.   carvedilol (COREG) 25 MG tablet TAKE 1 TABLET BY MOUTH TWICE A DAY WITH FOOD   chlorthalidone (HYGROTON) 25 MG tablet TAKE 1 TABLET (25 MG TOTAL) BY MOUTH DAILY.   Cholecalciferol (VITAMIN D3 SUPER STRENGTH) 50 MCG (2000 UT) TABS Take 1 tablet (2,000 Units total) by mouth daily.   clobetasol ointment (TEMOVATE) 1.44 % Apply 1 application topically 2 (two) times daily.   Continuous Blood Gluc Receiver (FREESTYLE LIBRE 2 READER) DEVI 1 Act by Does not apply route daily.   Continuous Blood Gluc Sensor (FREESTYLE LIBRE 2 SENSOR) MISC 1 Act by Does not apply route daily.   Dulaglutide (TRULICITY) 1.5 RX/5.4MG SOPN Inject into the skin once a week.   Empagliflozin-metFORMIN HCl (SYNJARDY) 12.5-500 MG TABS Take by mouth daily. Take 2 tablets daily.   famotidine (PEPCID) 40 MG tablet TAKE 1 TABLET BY MOUTH EVERY DAY   ferrous sulfate 325 (65 FE) MG tablet TAKE 1 TABLET BY MOUTH 3 TIMES DAILY WITH MEALS.   Finerenone (KERENDIA) 20 MG TABS Take 1 tablet by mouth daily. Via Patient Assistance Psychologist, occupational)   gabapentin (NEURONTIN) 300 MG capsule TAKE 1 CAPSULE  BY MOUTH THREE TIMES A DAY   glucose blood (FREESTYLE TEST STRIPS) test strip USE TO TEST BLOOD SUGAR 3 TIMES A DAY. DX: E11.8   irbesartan (AVAPRO) 300 MG tablet Take 0.5 tablets (150 mg total) by mouth daily.   latanoprost (XALATAN) 0.005 % ophthalmic solution Place 1 drop into both eyes at bedtime.    oxyCODONE-acetaminophen (PERCOCET/ROXICET) 5-325 MG tablet Take 1 tablet by mouth every 8 (eight) hours as needed for severe pain.   pantoprazole (PROTONIX) 40 MG tablet TAKE 1 TABLET BY MOUTH 2 TIMES DAILY BEFORE A MEAL   potassium chloride SA (KLOR-CON M20) 20 MEQ tablet  TAKE 1 TABLET BY MOUTH TWICE A DAY     Allergies  Allergen Reactions   Food Swelling    bananas   Penicillins Swelling and Rash    Social History   Socioeconomic History   Marital status: Widowed    Spouse name: Not on file   Number of children: 3   Years of education: 11th   Highest education level: 11th grade  Occupational History   Occupation: disabled  Tobacco Use   Smoking status: Never    Passive exposure: Never   Smokeless tobacco: Never  Vaping Use   Vaping Use: Never used  Substance and Sexual Activity   Alcohol use: No    Alcohol/week: 0.0 standard drinks of alcohol   Drug use: No   Sexual activity: Not Currently    Partners: Male    Comment: not working, lives with husband, 3 sons  Other Topics Concern   Not on file  Social History Narrative   Client's spouse recently passed Feb 2021      Lives with 2 Son and 1 granddaughter   R handed   Caffeine: ocas., has cut back and drinking more water   Social Determinants of Health   Financial Resource Strain: Low Risk  (02/27/2021)   Overall Financial Resource Strain (CARDIA)    Difficulty of Paying Living Expenses: Not hard at all  Food Insecurity: No Food Insecurity (02/27/2021)   Hunger Vital Sign    Worried About Running Out of Food in the Last Year: Never true    Ran Out of Food in the Last Year: Never true  Transportation Needs: Unknown (03/18/2020)   PRAPARE - Hydrologist (Medical): No    Lack of Transportation (Non-Medical): Not on file  Physical Activity: Inactive (02/27/2021)   Exercise Vital Sign    Days of Exercise per Week: 0 days    Minutes of Exercise per Session: 0 min  Stress: Stress Concern Present (02/27/2021)   [place]    Feeling of Stress : Rather much  Social Connections: Moderately Integrated (02/27/2021)   Social Connection and Isolation Panel [NHANES]    Frequency of Communication with  Friends and Family: More than three times a week    Frequency of Social Gatherings with Friends and Family: Once a week    Attends Religious Services: 1 to 4 times per year    Active Member of [employer] or Organizations: No    Attends Archivist Meetings: 1 to 4 times per year    Marital Status: Widowed  Intimate Partner Violence: Not At Risk (03/18/2020)   Humiliation, Afraid, Rape, and Kick questionnaire    Fear of Current or Ex-Partner: No    Emotionally Abused: No    Physically Abused: No    Sexually Abused: No  Review of Systems: General: negative for chills, fever, night sweats or weight changes.  Cardiovascular: negative for chest pain, dyspnea on exertion, edema, orthopnea, palpitations, paroxysmal nocturnal dyspnea or shortness of breath Dermatological: negative for rash Respiratory: negative for cough or wheezing Urologic: negative for hematuria Abdominal: negative for nausea, vomiting, diarrhea, bright red blood per rectum, melena, or hematemesis Neurologic: negative for visual changes, syncope, or dizziness All other systems reviewed and are otherwise negative except as noted above.    Blood pressure 128/76, pulse 81, height '5\' 6"'$  (1.676 m), weight 226 lb 6.4 oz (102.7 kg), SpO2 97 %.  General appearance: alert and no distress Neck: no adenopathy, no carotid bruit, no JVD, supple, symmetrical, trachea midline, and thyroid not enlarged, symmetric, no tenderness/mass/nodules Lungs: clear to auscultation bilaterally Heart: regular rate and rhythm, S1, S2 normal, no murmur, click, rub or gallop Extremities: extremities normal, atraumatic, no cyanosis or edema Pulses: 2+ and symmetric Skin: Skin color, texture, turgor normal. No rashes or lesions Neurologic: Grossly normal  EKG sinus rhythm at 81 with small inferior Q waves.  I personally reviewed this EKG.  ASSESSMENT AND PLAN:   Hyperlipidemia with target LDL less than 100 History of hyperlipidemia on statin  therapy with lipid profile performed 05/05/2022 revealing total cholesterol 141, LDL 56 and HDL of 47.  Essential hypertension, benign History of essential hypertension blood pressure measured today at 128/76.  She is on carvedilol, chlorthalidone and Avapro.  Obstructive sleep apnea treated with BiPAP History of obstructive sleep apnea on BiPAP  Elevated coronary artery calcium score Coronary CTA performed 04/02/2022 revealed a coronary calcium score of 454 with mild nonobstructive CAD.     Valerie Harp MD FACP,FACC,FAHA, [name] 01/12/2023 3:50 PM

## 2023-01-19 ENCOUNTER — Other Ambulatory Visit: Payer: Self-pay | Admitting: Internal Medicine

## 2023-01-21 ENCOUNTER — Telehealth: Payer: Self-pay | Admitting: Physician Assistant

## 2023-01-21 NOTE — Telephone Encounter (Signed)
Inbound call from patient stating that she had got a call to schedule a colonoscopy with Dr. Ulyses Amor office but was told that she was not able to because she had transferred her care to our office. Patient was seen in 2021 with Anderson [place] and we had not got records from patients procedures with Dr. Benson [place].  Patient is seeking advice if she can schedule procedure with [place]. Please advise.

## 2023-01-21 NOTE — Telephone Encounter (Signed)
Called Dr. Ulyses Amor office to see if they could fax over the last colonoscopy report that they have for patient since we only have EGD report from 02/2018. Will await fax from Dr. Ulyses Amor office.

## 2023-01-22 NOTE — Telephone Encounter (Signed)
Valerie [name], colonoscopy report received today. I have placed in your office in box for your review and recommendations. Thanks  Called and left patient a detailed message on her home vm letting her know that we will have Valerie [name] review report when she returns to the office and we will call her with the recommendations regarding scheduling recall colonoscopy. I advised patient to call the office if she had any questions in the interim.

## 2023-01-25 ENCOUNTER — Ambulatory Visit (INDEPENDENT_AMBULATORY_CARE_PROVIDER_SITE_OTHER): Payer: PPO | Admitting: *Deleted

## 2023-01-25 DIAGNOSIS — E538 Deficiency of other specified B group vitamins: Secondary | ICD-10-CM

## 2023-01-25 MED ORDER — CYANOCOBALAMIN 1000 MCG/ML IJ SOLN
1000.0000 ug | Freq: Once | INTRAMUSCULAR | Status: AC
  Administered 2023-01-25: 1000 ug via INTRAMUSCULAR

## 2023-01-25 NOTE — Progress Notes (Signed)
Pls cosign for B12 inj../lmb  

## 2023-01-26 ENCOUNTER — Encounter: Payer: Self-pay | Admitting: Neurology

## 2023-01-26 ENCOUNTER — Ambulatory Visit: Payer: PPO | Admitting: Neurology

## 2023-01-26 VITALS — BP 124/68 | HR 80 | Ht 66.0 in | Wt 226.6 lb

## 2023-01-26 DIAGNOSIS — G4733 Obstructive sleep apnea (adult) (pediatric): Secondary | ICD-10-CM

## 2023-01-26 DIAGNOSIS — R6889 Other general symptoms and signs: Secondary | ICD-10-CM | POA: Insufficient documentation

## 2023-01-26 NOTE — Progress Notes (Signed)
Provider:  Larey Seat, MD  Primary Care Physician:  Janith Lima, MD [place] [address]     Referring Provider: Janith Lima, [place],  [place] [address]          Chief Complaint according to patient   Patient presents with:     New Patient (Initial Visit)           HISTORY OF PRESENT ILLNESS:  Valerie [name] is a 77 y.o. female patient who is here for revisit 01/26/2023 for  BiPAP compliance and for memory concerns.  Chief concern according to patient :  ' I don't remember like I used to ".  Today is 26 January 2023 and I reviewed the patient's BiPAP compliance she is using 13 over 9 cm water pressure with a residual AHI of only 0.5 but with high air leaks.  She has only used her BiPAP 16 out of the last 30 days with a compliance of only 53%.  The average user time on days used is 9 hours 30 minutes the average user time on all days is 4 hours 55 minutes.  So for this last 30-day.  Her compliance has been poor she reports she had dental and she could not and did use the interface for a while -it should now be healing and hopefully she can resume.  The patient had repeatedly mentioned that she has memory difficulties and I have asked her today to do a [place] cognitive assessment with my Psychologist, sport and exercise.  The patient did not pass the Trail Making Test, she did draw a clock face correctly,  she copied the cube but is mirror image and not identical image.   She named all animals correctly, she did have good recall for 4 out of 5 words.   She repeated sentences correctly and could do a serial 7 subtraction.   Based on her score of 27 out of 30 I would consider her memory not impaired for age.  I asked her about possible depression: 6/ 15 points on GDS.  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 Another son has moved in  as well- and has changed jobs to a lesser paid job- still -all contribute financially to the household,  G-Daughter goes shopping.      08/12/22 ALL:  Valerie [name] returns for follow up for OSA on BiPAP. She is doing well on therapy. She is using her machine every night for about 6-7 hours. She denies concerns with machine or supplies.    She was seen by Dr Brett Fairy 04/2022 for dizziness. She was diagnosed with vertigo. She reports that she participated in PT but it was not helpful. She continues to have episodes of dizziness. She feels that the room is spinning when she turns her head.or if she leans forward. She reports a fall about 2 weeks ago. She was sitting in a chair and when she stood up, she got really dizzy then fell on her side. She landed on her sofa. No injuries. She reports BP can be anywhere from 120/70-170-180/90. It fluctuates daily. CBGs are usually 150's. Last A1C 6.3.    She reports that Dr Brett Fairy mentioned to her that we would perform a MOCA at today's visit. I am unable to locate any specific information regarding her memory  in the previous note. She states that she can be forgetful at times but no specific memory loss concerns. She manages her home and finances without difficulty. She drives. She is able to perform ADLs and manage medications independently. Her brother has dementia.    CD _ 04-15-2022,Valerie [name] was last seen for BiPAP follow up by Debbora Presto, NP  I have the pleasure of seeing Mrs. Sarvis today. Her primary neurologist  ( Dr Jannifer Franklin) has retired and therefor she will become a primary neurological patient within my sleep clinic. She is referred today for dizziness and ataxia , and at high risk of CVA due to cardiovascular disease, high calcium score, DM, Obesity, asthma and hypercholesterolemia. .   Pt reports almost daily dizziness and has been stumbling. Has had almost falls or falling into something like a sofa or chair. Dr Ronnald Ramp had ordered a  brain MRI, Cerebral  volume is not significantly changed since 03-21-2013, normal for age. And scattered small foci of nonspecific cerebral white matter T2 and FLAIR hyperintensity remain mild for age, minimally progressed. No cortical encephalomalacia or chronic cerebral blood products identified. Deep gray matter nuclei, brainstem and cerebellum appear normal. Vascular: Major intracranial vascular flow voids are stable.  There was no neurological explanation for ataxia. Her trend to falling is present for at least 2-3 years, preceding the MRI study.  She was so busy as a caregiver at the time for husband and son, that she didn't pay much attention to herself, so her symptoms were dated after husband's death in 21-Mar-2020- when she paid attention.     Review of Systems: Out of a complete 14 system review, the patient complains of only the following symptoms, and all other reviewed systems are negative.:  Fatigue, sleepiness , snoring, fragmented sleep, Insomnia, RLS, Nocturia.    How likely are you to doze in the following situations: 0 = not likely, 1 = slight chance, 2 = moderate chance, 3 = high chance   Sitting and Reading? Watching Television? Sitting inactive in a public place (theater or meeting)? As a passenger in a car for an hour without a break? Lying down in the afternoon when circumstances permit? Sitting and talking to someone? Sitting quietly after lunch without alcohol? In a car, while stopped for a few minutes in traffic?   Total = 4/ 24 points   FSS endorsed at 18/ 63 points.   Social History   Socioeconomic History   Marital status: Widowed    Spouse name: Not on file   Number of children: 3   Years of education: 11th   Highest education level: 11th grade  Occupational History   Occupation: disabled  Tobacco Use   Smoking status: Never    Passive exposure: Never   Smokeless tobacco: Never  Vaping Use   Vaping Use: Never used  Substance and Sexual Activity   Alcohol use: No     Alcohol/week: 0.0 standard drinks of alcohol   Drug use: No   Sexual activity: Not Currently    Partners: Male    Comment: not working, lives with husband, 3 sons  Other Topics Concern   Not on file  Social History Narrative   Client's spouse recently passed Feb 2021      Lives with 2 Son and 1 granddaughter   R handed   Caffeine: ocas., has cut back and drinking more water   Social Determinants of Health   Financial Resource Strain: Low Risk  (02/27/2021)   Overall  Financial Resource Strain (CARDIA)    Difficulty of Paying Living Expenses: Not hard at all  Food Insecurity: No Food Insecurity (02/27/2021)   Hunger Vital Sign    Worried About Running Out of Food in the Last Year: Never true    Ran Out of Food in the Last Year: Never true  Transportation Needs: Unknown (03/18/2020)   PRAPARE - Hydrologist (Medical): No    Lack of Transportation (Non-Medical): Not on file  Physical Activity: Inactive (02/27/2021)   Exercise Vital Sign    Days of Exercise per Week: 0 days    Minutes of Exercise per Session: 0 min  Stress: Stress Concern Present (02/27/2021)   [place]    Feeling of Stress : Rather much  Social Connections: Moderately Integrated (02/27/2021)   Social Connection and Isolation Panel [NHANES]    Frequency of Communication with Friends and Family: More than three times a week    Frequency of Social Gatherings with Friends and Family: Once a week    Attends Religious Services: 1 to 4 times per year    Active Member of [employer] or Organizations: No    Attends Archivist Meetings: 1 to 4 times per year    Marital Status: Widowed    Family History  Problem Relation Age of Onset   Alcohol abuse Mother    Heart attack Mother    Coronary artery disease Brother    Heart attack Brother    Hypertension Brother    Hyperlipidemia Brother    Heart attack Father     Hypertension Father    Hyperlipidemia Father    Heart disease Sister    Atrial fibrillation Sister    Hypertension Sister    Hyperlipidemia Sister    Hypertension Other        family history   Alcohol abuse Other    Colon cancer Brother 79   Hypertension Brother    Hyperlipidemia Brother    Dementia Brother    Hypertension Brother    Hyperlipidemia Brother    Diabetes Son    Hypertension Son    Hyperlipidemia Son    Diabetes Son    Hypertension Son    Hyperlipidemia Son    Cerebral palsy Son    Hypertension Son    Hyperlipidemia Son     Past Medical History:  Diagnosis Date   Anemia    Anxiety and depression    Arthritis    Asthma    Cataract    Degenerative arthritis    Depression    Diabetes mellitus, type 2 (HCC)    Fatigue    GERD (gastroesophageal reflux disease)    Glaucoma    Heart palpitations    Hemorrhoids    Hyperlipidemia    Hypertension    Hypothyroidism    Hypoxemia 11/24/2013   Memory deficit 10/04/2013   Obesity    Sleep apnea    BiPAP   Snoring disorder     Past Surgical History:  Procedure Laterality Date   benign tumors resected     CATARACT EXTRACTION Left    FOOT SURGERY Left    bone spur   REFRACTIVE SURGERY     Glaucoma   ROTATOR CUFF REPAIR Left    TUBAL LIGATION       Current Outpatient Medications on File Prior to Visit  Medication Sig Dispense Refill   albuterol (PROVENTIL) (2.5 MG/3ML) 0.083% nebulizer solution Take 3  mLs (2.5 mg total) by nebulization every 6 (six) hours as needed for wheezing or shortness of breath. 75 mL 3   albuterol (VENTOLIN HFA) 108 (90 Base) MCG/ACT inhaler Inhale 2 puffs into the lungs every 6 (six) hours as needed. For shortness of breath 3 each 4   aspirin 81 MG tablet Take 81 mg by mouth daily.      atorvastatin (LIPITOR) 20 MG tablet Take 1 tablet (20 mg total) by mouth daily. 90 tablet 1   budesonide-formoterol (SYMBICORT) 80-4.5 MCG/ACT inhaler Inhale 2 puffs into the lungs 2 (two) times  daily as needed. 3 each 1   carvedilol (COREG) 25 MG tablet TAKE 1 TABLET BY MOUTH TWICE A DAY WITH FOOD 180 tablet 0   chlorthalidone (HYGROTON) 25 MG tablet TAKE 1 TABLET (25 MG TOTAL) BY MOUTH DAILY. 90 tablet 0   Cholecalciferol (VITAMIN D3 SUPER STRENGTH) 50 MCG (2000 UT) TABS Take 1 tablet (2,000 Units total) by mouth daily. 90 tablet 1   clobetasol ointment (TEMOVATE) [id number] % Apply 1 application topically 2 (two) times daily. 60 g 1   Continuous Blood Gluc Receiver (FREESTYLE LIBRE 2 READER) DEVI 1 Act by Does not apply route daily. 2 each 5   Continuous Blood Gluc Sensor (FREESTYLE LIBRE 2 SENSOR) MISC 1 Act by Does not apply route daily. 2 each 5   Dulaglutide (TRULICITY) 1.5 0000000 SOPN Inject into the skin once a week.     Empagliflozin-metFORMIN HCl (SYNJARDY) 12.5-500 MG TABS Take by mouth daily. Take 2 tablets daily.     famotidine (PEPCID) 40 MG tablet TAKE 1 TABLET BY MOUTH EVERY DAY 90 tablet 1   ferrous sulfate 325 (65 FE) MG tablet TAKE 1 TABLET BY MOUTH 3 TIMES DAILY WITH MEALS. 270 tablet 0   Finerenone (KERENDIA) 20 MG TABS Take 1 tablet by mouth daily. Via Patient Assistance Psychologist, occupational) 90 tablet 3   gabapentin (NEURONTIN) 300 MG capsule TAKE 1 CAPSULE BY MOUTH THREE TIMES A DAY 270 capsule 1   glucose blood (FREESTYLE TEST STRIPS) test strip USE TO TEST BLOOD SUGAR 3 TIMES A DAY. DX: E11.8 300 strip 1   irbesartan (AVAPRO) 300 MG tablet Take 0.5 tablets (150 mg total) by mouth daily. 45 tablet 1   latanoprost (XALATAN) 0.005 % ophthalmic solution Place 1 drop into both eyes at bedtime.      oxyCODONE-acetaminophen (PERCOCET/ROXICET) 5-325 MG tablet Take 1 tablet by mouth every 8 (eight) hours as needed for severe pain. 90 tablet 0   pantoprazole (PROTONIX) 40 MG tablet TAKE 1 TABLET BY MOUTH 2 TIMES DAILY BEFORE A MEAL 180 tablet 0   potassium chloride SA (KLOR-CON M20) 20 MEQ tablet TAKE 1 TABLET BY MOUTH TWICE A DAY 180 tablet 1   No current facility-administered  medications on file prior to visit.    Allergies  Allergen Reactions   Food Swelling    bananas   Penicillins Swelling and Rash     DIAGNOSTIC DATA (LABS, IMAGING, TESTING) - I reviewed patient records, labs, notes, testing and imaging myself where available.  Lab Results  Component Value Date   WBC 9.7 09/08/2022   HGB 11.4 (L) 09/08/2022   HCT 33.8 (L) 09/08/2022   MCV 89.2 09/08/2022   PLT 322.0 09/08/2022      Component Value Date/Time   NA 135 09/08/2022 1409   NA 143 03/30/2022 1007   K 4.0 09/08/2022 1409   CL 96 09/08/2022 1409   CO2 29 09/08/2022 1409  GLUCOSE 103 (H) 09/08/2022 1409   BUN 19 09/08/2022 1409   BUN 14 03/30/2022 1007   CREATININE 1.14 09/08/2022 1409   CALCIUM 10.1 09/08/2022 1409   PROT 7.5 03/09/2022 1035   ALBUMIN 4.3 03/09/2022 1035   AST 14 03/09/2022 1035   ALT 15 03/09/2022 1035   ALKPHOS 100 03/09/2022 1035   BILITOT 0.5 03/09/2022 1035   GFRNONAA >60 08/29/2015 0630   GFRAA >60 08/29/2015 0630   Lab Results  Component Value Date   CHOL 141 05/05/2022   HDL 47.60 05/05/2022   LDLCALC 56 05/05/2022   LDLDIRECT 42.0 04/10/2021   TRIG 186.0 (H) 05/05/2022   CHOLHDL 3 05/05/2022   Lab Results  Component Value Date   HGBA1C 6.5 05/05/2022   Lab Results  Component Value Date   VITAMINB12 176 (L) 09/08/2022   Lab Results  Component Value Date   TSH 3.02 09/08/2022    PHYSICAL EXAM:  Today's Vitals   01/26/23 1046  BP: 124/68  Pulse: 80  Weight: 226 lb 9.6 oz (102.8 kg)  Height: 5' 6"$  (1.676 m)   Body mass index is 36.57 kg/m.   Wt Readings from Last 3 Encounters:  01/26/23 226 lb 9.6 oz (102.8 kg)  01/12/23 226 lb 6.4 oz (102.7 kg)  11/09/22 232 lb (105.2 kg)     Ht Readings from Last 3 Encounters:  01/26/23 5' 6"$  (1.676 m)  01/12/23 5' 6"$  (1.676 m)  11/09/22 5' 6"$  (1.676 m)      General: The patient is awake, alert and appears not in acute distress. The patient is well groomed. Head: Normocephalic,  atraumatic. Neck is supple.  Mallampati : 2-3,  neck circumference:16 inches . Dental status:  Cardiovascular:  Regular rate  without distended neck veins. Respiratory: Lungs are clear to auscultation.  Skin:  Without evidence of ankle edema, or rash. Trunk: The patient's posture is erect.   NEUROLOGIC EXAM: The patient is awake and alert, oriented to place and time.   Memory subjective described as intact.  Attention span & concentration ability appears normal.  Speech is fluent,  without  dysarthria, dysphonia or aphasia.  Mood and affect are appropriate.   Cranial nerves: no loss of smell or taste reported  Pupils are equal and briskly reactive to light. Funduscopic exam deferred.  Extraocular movements in vertical and horizontal planes were intact and without nystagmus. No Diplopia. Visual fields by finger perimetry are intact. Hearing was intact to soft voice and finger rubbing.   Facial sensation intact to fine touch. Facial motor strength is symmetric and tongue and uvula move midline.  Neck ROM : rotation, tilt and flexion extension were normal for age and shoulder shrug was symmetrical.    Gait and station: Patient could rise unassisted from a seated position, walked without assistive device.   Deep tendon reflexes: in the  upper and lower extremities are symmetric and intact.     ASSESSMENT AND PLAN 77 y.o. 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   3) OSA - she had to take a break from bipAP. needs to restart biPAP once her dental work scars have healed.     I plan to follow up either personally or through our NP within 12 months.   I would like to thank [name]  Carlean Jews, MD and Janith Lima, [place],  [place] [address] for allowing me to meet with and to take care of this  pleasant patient.   After spending a total time of  28  minutes face to face and additional time for physical and neurologic examination, review of laboratory studies,  personal review of imaging studies, reports and results of other testing and review of referral information / records as far as provided in visit,   Electronically signed by: Larey Seat, MD 01/26/2023 11:03 AM  [facility] [place] certified by The [employer] of Sleep Medicine and Diplomate of the [employer] of Sleep Medicine. Board certified In Neurology through the [place], Fellow of the [employer] of Neurology. Medical Director of [employer].

## 2023-01-26 NOTE — Patient Instructions (Signed)

## 2023-01-27 ENCOUNTER — Ambulatory Visit: Payer: HMO | Admitting: Neurology

## 2023-01-27 ENCOUNTER — Encounter: Payer: Self-pay | Admitting: Physician Assistant

## 2023-01-27 NOTE — Progress Notes (Signed)
Received records  01/27/2023 8:40 AM  Received patient's last colonoscopy records.  02/11/2018 colonoscopy for hematochezia and iron deficiency anemia with Dr. Benson Norway.  Findings at that time of one 3 mm polyp in the transverse colon and diverticulosis in the sigmoid and ascending colon.  Repeat recommended in 5 years for surveillance.  Discussed a brother with colon cancer died at the age of 73.  Pathology report showed tubular adenoma with no high-grade dysplasia.    Patient is due for another colonoscopy 02/12/2023.  This information will be relayed to her.  I think it be best for her to come in for an office visit first to discuss.  Ellouise Newer, PA-C

## 2023-02-09 ENCOUNTER — Encounter: Payer: Self-pay | Admitting: Internal Medicine

## 2023-02-09 ENCOUNTER — Ambulatory Visit (INDEPENDENT_AMBULATORY_CARE_PROVIDER_SITE_OTHER): Payer: PPO | Admitting: Internal Medicine

## 2023-02-09 VITALS — BP 134/74 | HR 85 | Temp 98.2°F | Ht 66.0 in | Wt 229.0 lb

## 2023-02-09 DIAGNOSIS — E039 Hypothyroidism, unspecified: Secondary | ICD-10-CM

## 2023-02-09 DIAGNOSIS — N1831 Chronic kidney disease, stage 3a: Secondary | ICD-10-CM | POA: Diagnosis not present

## 2023-02-09 DIAGNOSIS — D510 Vitamin B12 deficiency anemia due to intrinsic factor deficiency: Secondary | ICD-10-CM

## 2023-02-09 DIAGNOSIS — E118 Type 2 diabetes mellitus with unspecified complications: Secondary | ICD-10-CM | POA: Diagnosis not present

## 2023-02-09 DIAGNOSIS — K760 Fatty (change of) liver, not elsewhere classified: Secondary | ICD-10-CM | POA: Diagnosis not present

## 2023-02-09 DIAGNOSIS — I10 Essential (primary) hypertension: Secondary | ICD-10-CM

## 2023-02-09 LAB — HEPATIC FUNCTION PANEL
ALT: 15 U/L (ref 0–35)
AST: 13 U/L (ref 0–37)
Albumin: 4 g/dL (ref 3.5–5.2)
Alkaline Phosphatase: 101 U/L (ref 39–117)
Bilirubin, Direct: 0.1 mg/dL (ref 0.0–0.3)
Total Bilirubin: 0.4 mg/dL (ref 0.2–1.2)
Total Protein: 7.4 g/dL (ref 6.0–8.3)

## 2023-02-09 LAB — CBC WITH DIFFERENTIAL/PLATELET
Basophils Absolute: 0.1 10*3/uL (ref 0.0–0.1)
Basophils Relative: 0.7 % (ref 0.0–3.0)
Eosinophils Absolute: 0.4 10*3/uL (ref 0.0–0.7)
Eosinophils Relative: 4 % (ref 0.0–5.0)
HCT: 33.9 % — ABNORMAL LOW (ref 36.0–46.0)
Hemoglobin: 11.3 g/dL — ABNORMAL LOW (ref 12.0–15.0)
Lymphocytes Relative: 34.1 % (ref 12.0–46.0)
Lymphs Abs: 3.5 10*3/uL (ref 0.7–4.0)
MCHC: 33.4 g/dL (ref 30.0–36.0)
MCV: 89.5 fl (ref 78.0–100.0)
Monocytes Absolute: 1.1 10*3/uL — ABNORMAL HIGH (ref 0.1–1.0)
Monocytes Relative: 10.7 % (ref 3.0–12.0)
Neutro Abs: 5.2 10*3/uL (ref 1.4–7.7)
Neutrophils Relative %: 50.5 % (ref 43.0–77.0)
Platelets: 357 10*3/uL (ref 150.0–400.0)
RBC: 3.78 Mil/uL — ABNORMAL LOW (ref 3.87–5.11)
RDW: 15.8 % — ABNORMAL HIGH (ref 11.5–15.5)
WBC: 10.3 10*3/uL (ref 4.0–10.5)

## 2023-02-09 LAB — BASIC METABOLIC PANEL
BUN: 14 mg/dL (ref 6–23)
CO2: 29 mEq/L (ref 19–32)
Calcium: 9.8 mg/dL (ref 8.4–10.5)
Chloride: 101 mEq/L (ref 96–112)
Creatinine, Ser: 1.13 mg/dL (ref 0.40–1.20)
GFR: 47.79 mL/min — ABNORMAL LOW (ref >60.00)
Glucose, Bld: 99 mg/dL (ref 70–99)
Potassium: 3.7 mEq/L (ref 3.5–5.1)
Sodium: 140 mEq/L (ref 135–145)

## 2023-02-09 LAB — HEMOGLOBIN A1C: Hgb A1c MFr Bld: 6.7 % — ABNORMAL HIGH (ref 4.6–6.5)

## 2023-02-09 LAB — TSH: TSH: 3 u[IU]/mL (ref 0.35–5.50)

## 2023-02-09 MED ORDER — TRULICITY 1.5 MG/0.5ML ~~LOC~~ SOAJ: 1.5000 mg | SUBCUTANEOUS | 0 refills | Status: DC

## 2023-02-09 MED ORDER — SYNJARDY 12.5-500 MG PO TABS: 1.0000 | ORAL_TABLET | Freq: Two times a day (BID) | ORAL | 1 refills | Status: DC

## 2023-02-09 NOTE — Progress Notes (Signed)
Subjective:  Patient ID: Valerie [name], female    DOB: 1946/09/01  Age: 77 y.o. MRN: [id number]  CC: Hypertension, Diabetes, and Hyperlipidemia   HPI Valerie [name] presents for f/up -----  Valerie [name] is active and denies DOE, CP, SOB, edema.  Outpatient Medications Prior to Visit  Medication Sig Dispense Refill   albuterol (PROVENTIL) (2.5 MG/3ML) 0.083% nebulizer solution Take 3 mLs (2.5 mg total) by nebulization every 6 (six) hours as needed for wheezing or shortness of breath. (Patient not taking: Reported on 02/10/2023) 75 mL 3   albuterol (VENTOLIN HFA) 108 (90 Base) MCG/ACT inhaler Inhale 2 puffs into the lungs every 6 (six) hours as needed. For shortness of breath 3 each 4   aspirin 81 MG tablet Take 81 mg by mouth daily.      atorvastatin (LIPITOR) 20 MG tablet Take 1 tablet (20 mg total) by mouth daily. 90 tablet 1   budesonide-formoterol (SYMBICORT) 80-4.5 MCG/ACT inhaler Inhale 2 puffs into the lungs 2 (two) times daily as needed. 3 each 1   carvedilol (COREG) 25 MG tablet TAKE 1 TABLET BY MOUTH TWICE A DAY WITH FOOD 180 tablet 0   chlorthalidone (HYGROTON) 25 MG tablet TAKE 1 TABLET (25 MG TOTAL) BY MOUTH DAILY. 90 tablet 0   Cholecalciferol (VITAMIN D3 SUPER STRENGTH) 50 MCG (2000 UT) TABS Take 1 tablet (2,000 Units total) by mouth daily. 90 tablet 1   clobetasol ointment (TEMOVATE) [id number] % Apply 1 application topically 2 (two) times daily. (Patient not taking: Reported on 02/10/2023) 60 g 1   Continuous Blood Gluc Receiver (FREESTYLE LIBRE 2 READER) DEVI 1 Act by Does not apply route daily. (Patient not taking: Reported on 02/10/2023) 2 each 5   Continuous Blood Gluc Sensor (FREESTYLE LIBRE 2 SENSOR) MISC 1 Act by Does not apply route daily. (Patient not taking: Reported on 02/10/2023) 2 each 5   famotidine (PEPCID) 40 MG tablet TAKE 1 TABLET BY MOUTH EVERY DAY 90 tablet 1   ferrous sulfate 325 (65 FE) MG tablet TAKE 1 TABLET BY MOUTH 3 TIMES DAILY WITH MEALS. 270 tablet 0    Finerenone (KERENDIA) 20 MG TABS Take 1 tablet by mouth daily. Via Patient Assistance Psychologist, occupational) 90 tablet 3   gabapentin (NEURONTIN) 300 MG capsule TAKE 1 CAPSULE BY MOUTH THREE TIMES A DAY 270 capsule 1   glucose blood (FREESTYLE TEST STRIPS) test strip USE TO TEST BLOOD SUGAR 3 TIMES A DAY. DX: E11.8 (Patient not taking: Reported on 02/10/2023) 300 strip 1   irbesartan (AVAPRO) 300 MG tablet Take 0.5 tablets (150 mg total) by mouth daily. 45 tablet 1   latanoprost (XALATAN) 0.005 % ophthalmic solution Place 1 drop into both eyes at bedtime.      oxyCODONE-acetaminophen (PERCOCET/ROXICET) 5-325 MG tablet Take 1 tablet by mouth every 8 (eight) hours as needed for severe pain. 90 tablet 0   pantoprazole (PROTONIX) 40 MG tablet TAKE 1 TABLET BY MOUTH 2 TIMES DAILY BEFORE A MEAL 180 tablet 0   potassium chloride SA (KLOR-CON M20) 20 MEQ tablet TAKE 1 TABLET BY MOUTH TWICE A DAY 180 tablet 1   Dulaglutide (TRULICITY) 1.5 0000000 SOPN Inject into the skin once a week.     Empagliflozin-metFORMIN HCl (SYNJARDY) 12.5-500 MG TABS Take by mouth daily. Take 2 tablets daily.     No facility-administered medications prior to visit.    ROS Review of Systems  Constitutional: Negative.  Negative for diaphoresis, fatigue and unexpected weight change.  HENT: Negative.  Eyes: Negative.   Respiratory:  Negative for cough, chest tightness, shortness of breath and wheezing.   Cardiovascular:  Negative for chest pain, palpitations and leg swelling.  Gastrointestinal:  Negative for abdominal pain, diarrhea, nausea and vomiting.  Endocrine: Negative.   Genitourinary: Negative.  Negative for difficulty urinating.  Musculoskeletal:  Positive for arthralgias, back pain and gait problem. Negative for myalgias and neck pain.  Skin: Negative.   Neurological:  Negative for dizziness and weakness.  Hematological:  Negative for adenopathy. Does not bruise/bleed easily.  Psychiatric/Behavioral: Negative.       Objective:  BP 134/74 (BP Location: Right Arm, Patient Position: Sitting, Cuff Size: Large)   Pulse 85   Temp 98.2 F (36.8 C) (Oral)   Ht '5\' 6"'$  (1.676 m)   Wt 229 lb (103.9 kg)   SpO2 98%   BMI 36.96 kg/m   BP Readings from Last 3 Encounters:  02/09/23 134/74  01/26/23 124/68  01/12/23 128/76    Wt Readings from Last 3 Encounters:  02/10/23 229 lb (103.9 kg)  02/09/23 229 lb (103.9 kg)  01/26/23 226 lb 9.6 oz (102.8 kg)    Physical Exam Vitals reviewed.  HENT:     Mouth/Throat:     Mouth: Mucous membranes are moist.  Eyes:     General: No scleral icterus.    Conjunctiva/sclera: Conjunctivae normal.  Cardiovascular:     Rate and Rhythm: Normal rate and regular rhythm.     Heart sounds: No murmur heard. Pulmonary:     Effort: Pulmonary effort is normal.     Breath sounds: No stridor. No wheezing, rhonchi or rales.  Abdominal:     General: Abdomen is protuberant. There is no distension.     Palpations: There is no mass.     Tenderness: There is no abdominal tenderness. There is no guarding.     Hernia: No hernia is present.  Musculoskeletal:        General: Normal range of motion.     Cervical back: Neck supple.     Right lower leg: No edema.     Left lower leg: No edema.  Lymphadenopathy:     Cervical: No cervical adenopathy.  Skin:    General: Skin is warm and dry.  Neurological:     General: No focal deficit present.     Mental Status: Valerie [name] is alert.  Psychiatric:        Mood and Affect: Mood normal.        Behavior: Behavior normal.     Lab Results  Component Value Date   WBC 10.3 02/09/2023   HGB 11.3 (L) 02/09/2023   HCT 33.9 (L) 02/09/2023   PLT 357.0 02/09/2023   GLUCOSE 99 02/09/2023   CHOL 141 05/05/2022   TRIG 186.0 (H) 05/05/2022   HDL 47.60 05/05/2022   LDLDIRECT 42.0 04/10/2021   LDLCALC 56 05/05/2022   ALT 15 02/09/2023   AST 13 02/09/2023   NA 140 02/09/2023   K 3.7 02/09/2023   CL 101 02/09/2023   CREATININE 1.13  02/09/2023   BUN 14 02/09/2023   CO2 29 02/09/2023   TSH 3.00 02/09/2023   INR 1.1 (H) 04/10/2021   HGBA1C 6.7 (H) 02/09/2023   MICROALBUR <0.7 09/08/2022    No results found.  Assessment & Plan:   Valerie [name] was seen today for hypertension, diabetes and hyperlipidemia.  Diagnoses and all orders for this visit:  Essential hypertension, benign- Her BP is well controlled. -     Basic  metabolic panel; Future -     Basic metabolic panel  Type II diabetes mellitus with manifestations ([place])- Her blood sugar is well controlled. -     Basic metabolic panel; Future -     Hemoglobin A1c; Future -     Hemoglobin A1c -     Basic metabolic panel -     Empagliflozin-metFORMIN HCl (SYNJARDY) 12.5-500 MG TABS; Take 1 tablet by mouth 2 (two) times daily. Take 2 tablets daily. -     Dulaglutide (TRULICITY) 1.5 0000000 SOPN; Inject 1.5 mg into the skin once a week. -     Blood Glucose Monitoring Suppl ([place]) DEVI; 1 Act by Does not apply route 2 (two) times daily. -     glucose blood (ONETOUCH VERIO) test strip; 1 each by Other route 2 (two) times daily. Use as instructed -     Blood Glucose Calibration (ONETOUCH VERIO) LIQD; 1 Act by In Vitro route daily.  Fatty liver disease, nonalcoholic -     Hepatic function panel; Future -     Hepatic function panel  Acquired hypothyroidism- Valerie [name] is euthyroid -     TSH; Future -     TSH  Stage 3a chronic kidney disease ([place]) - Her renal function is stable. 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 [place]'s Syn[employer] and [employer]. I am also having her start on [employer], [employer], and [employer]. Additionally, I am having her maintain her latanoprost, aspirin, clobetasol ointment, FREESTYLE TEST STRIPS,  albuterol, ferrous sulfate, atorvastatin, budesonide-formoterol, Vitamin D3 Super Strength, gabapentin, irbesartan, albuterol, FreeStyle Libre 2 Sensor, [employer] 2 Reader, potassium chloride SA, oxyCODONE-acetaminophen, Kerendia, chlorthalidone, famotidine, carvedilol, and pantoprazole.  Meds ordered this encounter  Medications   Empagliflozin-metFORMIN HCl (SYNJARDY) 12.5-500 MG TABS    Sig: Take 1 tablet by mouth 2 (two) times daily. Take 2 tablets daily.    Dispense:  180 tablet    Refill:  1   Dulaglutide (TRULICITY) 1.5 0000000 SOPN    Sig: Inject 1.5 mg into the skin once a week.    Dispense:  6 mL    Refill:  0   Blood Glucose Monitoring Suppl (ONETOUCH VERIO FLEX SYSTEM) DEVI    Sig: 1 Act by Does not apply route 2 (two) times daily.    Dispense:  1 each    Refill:  3   glucose blood (ONETOUCH VERIO) test strip    Sig: 1 each by Other route 2 (two) times daily. Use as instructed    Dispense:  200 strip    Refill:  1   Blood Glucose Calibration (ONETOUCH VERIO) LIQD    Sig: 1 Act by In Vitro route daily.    Dispense:  1 each    Refill:  5     Follow-up: Return in about 6 months (around 08/10/2023).  [name], MD

## 2023-02-09 NOTE — Patient Instructions (Signed)

## 2023-02-10 ENCOUNTER — Ambulatory Visit (INDEPENDENT_AMBULATORY_CARE_PROVIDER_SITE_OTHER): Payer: PPO

## 2023-02-10 VITALS — Wt 229.0 lb

## 2023-02-10 DIAGNOSIS — Z0000 Encounter for general adult medical examination without abnormal findings: Secondary | ICD-10-CM | POA: Diagnosis not present

## 2023-02-10 NOTE — Patient Instructions (Signed)
Valerie [name] , Thank you for taking time to come for your Medicare Wellness Visit. I appreciate your ongoing commitment to your health goals. Please review the following plan we discussed and let me know if I can assist you in the future.   These are the goals we discussed:  Goals      Manage My Medicine     Timeframe:  Long-Range Goal Priority:  Medium Start Date:   05/30/21                          Expected End Date:    12/13/2022                   Follow Up Date: 09/2022   - call for medicine refill 2 or 3 days before it runs out - call if I am sick and can't take my medicine - keep a list of all the medicines I take; vitamins and herbals too - use a pillbox to sort medicine    Why is this important?   These steps will help you keep on track with your medicines.   Notes:      Patient Stated     Lose weight         This is a list of the screening recommended for you and due dates:  Health Maintenance  Topic Date Due   COVID-19 Vaccine (4 - 2023-24 season) 08/14/2022   Hemoglobin A1C  08/10/2023   Eye exam for diabetics  08/27/2023   Yearly kidney health urinalysis for diabetes  09/09/2023   Complete foot exam   09/09/2023   Yearly kidney function blood test for diabetes  02/10/2024   Medicare Annual Wellness Visit  02/11/2024   Mammogram  10/14/2024   DTaP/Tdap/Td vaccine (3 - Td or Tdap) 06/03/2027   Colon Cancer Screening  02/12/2028   Pneumonia Vaccine  Completed   Flu Shot  Completed   DEXA scan (bone density measurement)  Completed   Hepatitis C Screening: USPSTF Recommendation to screen - Ages 36-79 yo.  Completed   Zoster (Shingles) Vaccine  Completed   HPV Vaccine  Aged Out    Advanced directives: Please bring a copy of your health care power of attorney and living will to the office at your convenience.  Conditions/risks identified: lose some weight   Next appointment: Follow up in one year for your annual wellness visit .tha   Preventive Care 65 Years  and Older, Female Preventive care refers to lifestyle choices and visits with your health care provider that can promote health and wellness. What does preventive care include? A yearly physical exam. This is also called an annual well check. Dental exams once or twice a year. Routine eye exams. Ask your health care provider how often you should have your eyes checked. Personal lifestyle choices, including: Daily care of your teeth and gums. Regular physical activity. Eating a healthy diet. Avoiding tobacco and drug use. Limiting alcohol use. Practicing safe sex. Taking low-dose aspirin every day. Taking vitamin and mineral supplements as recommended by your health care provider. What happens during an annual well check? The services and screenings done by your health care provider during your annual well check will depend on your age, overall health, lifestyle risk factors, and family history of disease. Counseling  Your health care provider may ask you questions about your: Alcohol use. Tobacco use. Drug use. Emotional well-being. Home and relationship well-being. Sexual activity. Eating habits.  History of falls. Memory and ability to understand (cognition). Work and work Statistician. Reproductive health. Screening  You may have the following tests or measurements: Height, weight, and BMI. Blood pressure. Lipid and cholesterol levels. These may be checked every 5 years, or more frequently if you are over 22 years old. Skin check. Lung cancer screening. You may have this screening every year starting at age 34 if you have a 30-pack-year history of smoking and currently smoke or have quit within the past 15 years. Fecal occult blood test (FOBT) of the stool. You may have this test every year starting at age 50. Flexible sigmoidoscopy or colonoscopy. You may have a sigmoidoscopy every 5 years or a colonoscopy every 10 years starting at age 8. Hepatitis C blood test. Hepatitis  B blood test. Sexually transmitted disease (STD) testing. Diabetes screening. This is done by checking your blood sugar (glucose) after you have not eaten for a while (fasting). You may have this done every 1-3 years. Bone density scan. This is done to screen for osteoporosis. You may have this done starting at age 75. Mammogram. This may be done every 1-2 years. Talk to your health care provider about how often you should have regular mammograms. Talk with your health care provider about your test results, treatment options, and if necessary, the need for more tests. Vaccines  Your health care provider may recommend certain vaccines, such as: Influenza vaccine. This is recommended every year. Tetanus, diphtheria, and acellular pertussis (Tdap, Td) vaccine. You may need a Td booster every 10 years. Zoster vaccine. You may need this after age 84. Pneumococcal 13-valent conjugate (PCV13) vaccine. One dose is recommended after age 28. Pneumococcal polysaccharide (PPSV23) vaccine. One dose is recommended after age 35. Talk to your health care provider about which screenings and vaccines you need and how often you need them. This information is not intended to replace advice given to you by your health care provider. Make sure you discuss any questions you have with your health care provider. Document Released: 12/27/2015 Document Revised: 08/19/2016 Document Reviewed: 10/01/2015 Elsevier Interactive Patient Education  2017 [place] Prevention in the Home Falls can cause injuries. They can happen to people of all ages. There are many things you can do to make your home safe and to help prevent falls. What can I do on the outside of my home? Regularly fix the edges of walkways and driveways and fix any cracks. Remove anything that might make you trip as you walk through a door, such as a raised step or threshold. Trim any bushes or trees on the path to your home. Use bright outdoor  lighting. Clear any walking paths of anything that might make someone trip, such as rocks or tools. Regularly check to see if handrails are loose or broken. Make sure that both sides of any steps have handrails. Any raised decks and porches should have guardrails on the edges. Have any leaves, snow, or ice cleared regularly. Use sand or salt on walking paths during winter. Clean up any spills in your garage right away. This includes oil or grease spills. What can I do in the bathroom? Use night lights. Install grab bars by the toilet and in the tub and shower. Do not use towel bars as grab bars. Use non-skid mats or decals in the tub or shower. If you need to sit down in the shower, use a plastic, non-slip stool. Keep the floor dry. Clean up any water that spills  on the floor as soon as it happens. Remove soap buildup in the tub or shower regularly. Attach bath mats securely with double-sided non-slip rug tape. Do not have throw rugs and other things on the floor that can make you trip. What can I do in the bedroom? Use night lights. Make sure that you have a light by your bed that is easy to reach. Do not use any sheets or blankets that are too big for your bed. They should not hang down onto the floor. Have a firm chair that has side arms. You can use this for support while you get dressed. Do not have throw rugs and other things on the floor that can make you trip. What can I do in the kitchen? Clean up any spills right away. Avoid walking on wet floors. Keep items that you use a lot in easy-to-reach places. If you need to reach something above you, use a strong step stool that has a grab bar. Keep electrical cords out of the way. Do not use floor polish or wax that makes floors slippery. If you must use wax, use non-skid floor wax. Do not have throw rugs and other things on the floor that can make you trip. What can I do with my stairs? Do not leave any items on the stairs. Make  sure that there are handrails on both sides of the stairs and use them. Fix handrails that are broken or loose. Make sure that handrails are as long as the stairways. Check any carpeting to make sure that it is firmly attached to the stairs. Fix any carpet that is loose or worn. Avoid having throw rugs at the top or bottom of the stairs. If you do have throw rugs, attach them to the floor with carpet tape. Make sure that you have a light switch at the top of the stairs and the bottom of the stairs. If you do not have them, ask someone to add them for you. What else can I do to help prevent falls? Wear shoes that: Do not have high heels. Have rubber bottoms. Are comfortable and fit you well. Are closed at the toe. Do not wear sandals. If you use a stepladder: Make sure that it is fully opened. Do not climb a closed stepladder. Make sure that both sides of the stepladder are locked into place. Ask someone to hold it for you, if possible. Clearly mark and make sure that you can see: Any grab bars or handrails. First and last steps. Where the edge of each step is. Use tools that help you move around (mobility aids) if they are needed. These include: Canes. Walkers. Scooters. Crutches. Turn on the lights when you go into a dark area. Replace any light bulbs as soon as they burn out. Set up your furniture so you have a clear path. Avoid moving your furniture around. If any of your floors are uneven, fix them. If there are any pets around you, be aware of where they are. Review your medicines with your doctor. Some medicines can make you feel dizzy. This can increase your chance of falling. Ask your doctor what other things that you can do to help prevent falls. This information is not intended to replace advice given to you by your health care provider. Make sure you discuss any questions you have with your health care provider. Document Released: 09/26/2009 Document Revised: 05/07/2016  Document Reviewed: 01/04/2015 Elsevier Interactive Patient Education  2017 [employer].

## 2023-02-10 NOTE — Progress Notes (Signed)
I connected with  [name] on 02/10/23 by a audio enabled telemedicine application and verified that I am speaking with the correct person using two identifiers.  Patient Location: Home  Provider Location: Home Office  I discussed the limitations of evaluation and management by telemedicine. The patient expressed understanding and agreed to proceed.   Subjective:   Valerie [name] is a 77 y.o. female who presents for Medicare Annual (Subsequent) preventive examination.  Review of Systems     Cardiac Risk Factors include: advanced age (>57mn, >>81women);hypertension;dyslipidemia;diabetes mellitus     Objective:    Today's Vitals   02/10/23 1106  Weight: 229 lb (103.9 kg)   Body mass index is 36.96 kg/m.     02/10/2023   11:15 AM 07/28/2022    3:01 PM 02/27/2021   11:10 AM 03/18/2020   12:41 PM 03/08/2020   11:29 AM 03/17/2019    1:19 PM 02/23/2019   12:37 PM  Advanced Directives  Does Patient Have a Medical Advance Directive? Yes No Yes No No No No  Type of AParamedicof [place] will        Does patient want to make changes to medical advance directive?   No - Patient declined No - Patient declined     Copy of HNehalemin Chart? No - copy requested        Would patient like information on creating a medical advance directive?    No - Patient declined Yes (MAU/Ambulatory/Procedural Areas - Information given) No - Guardian declined Yes (MAU/Ambulatory/Procedural Areas - Information given)    Current Medications (verified) Outpatient Encounter Medications as of 02/10/2023  Medication Sig   albuterol (VENTOLIN HFA) 108 (90 Base) MCG/ACT inhaler Inhale 2 puffs into the lungs every 6 (six) hours as needed. For shortness of breath   aspirin 81 MG tablet Take 81 mg by mouth daily.    atorvastatin (LIPITOR) 20 MG tablet Take 1 tablet (20 mg total) by mouth daily.   budesonide-formoterol (SYMBICORT) 80-4.5 MCG/ACT inhaler Inhale  2 puffs into the lungs 2 (two) times daily as needed.   carvedilol (COREG) 25 MG tablet TAKE 1 TABLET BY MOUTH TWICE A DAY WITH FOOD   chlorthalidone (HYGROTON) 25 MG tablet TAKE 1 TABLET (25 MG TOTAL) BY MOUTH DAILY.   Cholecalciferol (VITAMIN D3 SUPER STRENGTH) 50 MCG (2000 UT) TABS Take 1 tablet (2,000 Units total) by mouth daily.   Dulaglutide (TRULICITY) 1.5 M0000000SOPN Inject 1.5 mg into the skin once a week.   Empagliflozin-metFORMIN HCl (SYNJARDY) 12.5-500 MG TABS Take 1 tablet by mouth 2 (two) times daily. Take 2 tablets daily.   famotidine (PEPCID) 40 MG tablet TAKE 1 TABLET BY MOUTH EVERY DAY   ferrous sulfate 325 (65 FE) MG tablet TAKE 1 TABLET BY MOUTH 3 TIMES DAILY WITH MEALS.   Finerenone (KERENDIA) 20 MG TABS Take 1 tablet by mouth daily. Via Patient Assistance (Psychologist, occupational   gabapentin (NEURONTIN) 300 MG capsule TAKE 1 CAPSULE BY MOUTH THREE TIMES A DAY   irbesartan (AVAPRO) 300 MG tablet Take 0.5 tablets (150 mg total) by mouth daily.   latanoprost (XALATAN) 0.005 % ophthalmic solution Place 1 drop into both eyes at bedtime.    oxyCODONE-acetaminophen (PERCOCET/ROXICET) 5-325 MG tablet Take 1 tablet by mouth every 8 (eight) hours as needed for severe pain.   pantoprazole (PROTONIX) 40 MG tablet TAKE 1 TABLET BY MOUTH 2 TIMES DAILY BEFORE A MEAL   potassium chloride SA (KLOR-CON  M20) 20 MEQ tablet TAKE 1 TABLET BY MOUTH TWICE A DAY   albuterol (PROVENTIL) (2.5 MG/3ML) 0.083% nebulizer solution Take 3 mLs (2.5 mg total) by nebulization every 6 (six) hours as needed for wheezing or shortness of breath. (Patient not taking: Reported on 02/10/2023)   clobetasol ointment (TEMOVATE) [id number] % Apply 1 application topically 2 (two) times daily. (Patient not taking: Reported on 02/10/2023)   Continuous Blood Gluc Receiver (FREESTYLE LIBRE 2 READER) DEVI 1 Act by Does not apply route daily. (Patient not taking: Reported on 02/10/2023)   Continuous Blood Gluc Sensor (FREESTYLE LIBRE 2 SENSOR) MISC 1  Act by Does not apply route daily. (Patient not taking: Reported on 02/10/2023)   glucose blood (FREESTYLE TEST STRIPS) test strip USE TO TEST BLOOD SUGAR 3 TIMES A DAY. DX: E11.8 (Patient not taking: Reported on 02/10/2023)   No facility-administered encounter medications on file as of 02/10/2023.    Allergies (verified) Food and Penicillins   History: Past Medical History:  Diagnosis Date   Anemia    Anxiety and depression    Arthritis    Asthma    Cataract    Degenerative arthritis    Depression    Diabetes mellitus, type 2 (HCC)    Fatigue    GERD (gastroesophageal reflux disease)    Glaucoma    Heart palpitations    Hemorrhoids    Hyperlipidemia    Hypertension    Hypothyroidism    Hypoxemia 11/24/2013   Memory deficit 10/04/2013   Obesity    Sleep apnea    BiPAP   Snoring disorder    Past Surgical History:  Procedure Laterality Date   benign tumors resected     CATARACT EXTRACTION Left    FOOT SURGERY Left    bone spur   REFRACTIVE SURGERY     Glaucoma   ROTATOR CUFF REPAIR Left    TUBAL LIGATION     Family History  Problem Relation Age of Onset   Alcohol abuse Mother    Heart attack Mother    Coronary artery disease Brother    Heart attack Brother    Hypertension Brother    Hyperlipidemia Brother    Heart attack Father    Hypertension Father    Hyperlipidemia Father    Heart disease Sister    Atrial fibrillation Sister    Hypertension Sister    Hyperlipidemia Sister    Hypertension Other        family history   Alcohol abuse Other    Colon cancer Brother 56   Hypertension Brother    Hyperlipidemia Brother    Dementia Brother    Hypertension Brother    Hyperlipidemia Brother    Diabetes Son    Hypertension Son    Hyperlipidemia Son    Diabetes Son    Hypertension Son    Hyperlipidemia Son    Cerebral palsy Son    Hypertension Son    Hyperlipidemia Son    Social History   Socioeconomic History   Marital status: Widowed    Spouse  name: Not on file   Number of children: 3   Years of education: 11th   Highest education level: 11th grade  Occupational History   Occupation: disabled  Tobacco Use   Smoking status: Never    Passive exposure: Never   Smokeless tobacco: Never  Vaping Use   Vaping Use: Never used  Substance and Sexual Activity   Alcohol use: No    Alcohol/week: 0.0 standard drinks  of alcohol   Drug use: No   Sexual activity: Not Currently    Partners: Male    Comment: not working, lives with husband, 3 sons  Other Topics Concern   Not on file  Social History Narrative   Client's spouse recently passed Feb 2021      Lives with 2 Son and 1 granddaughter   R handed   Caffeine: ocas., has cut back and drinking more water   Social Determinants of Health   Financial Resource Strain: Low Risk  (02/10/2023)   Overall Financial Resource Strain (CARDIA)    Difficulty of Paying Living Expenses: Not hard at all  Food Insecurity: No Food Insecurity (02/10/2023)   Hunger Vital Sign    Worried About Running Out of Food in the Last Year: Never true    Ran Out of Food in the Last Year: Never true  Transportation Needs: No Transportation Needs (02/10/2023)   PRAPARE - Hydrologist (Medical): No    Lack of Transportation (Non-Medical): No  Physical Activity: Inactive (02/10/2023)   Exercise Vital Sign    Days of Exercise per Week: 0 days    Minutes of Exercise per Session: 0 min  Stress: No Stress Concern Present (02/10/2023)   [place]    Feeling of Stress : Only a little  Social Connections: Moderately Isolated (02/10/2023)   Social Connection and Isolation Panel [NHANES]    Frequency of Communication with Friends and Family: More than three times a week    Frequency of Social Gatherings with Friends and Family: Once a week    Attends Religious Services: 1 to 4 times per year    Active Member of [employer] or  Organizations: No    Attends Archivist Meetings: Never    Marital Status: Widowed    Tobacco Counseling Counseling given: Not Answered   Clinical Intake:  Pre-visit preparation completed: Yes  Pain : No/denies pain     BMI - recorded: 36.96 Nutritional Status: BMI > 30  Obese Nutritional Risks: None Diabetes: Yes CBG done?: No Did pt. bring in CBG monitor from home?: No  How often do you need to have someone help you when you read instructions, pamphlets, or other written materials from your doctor or pharmacy?: 1 - Never  Diabetic?Nutrition Risk Assessment:  Has the patient had any N/V/D within the last 2 months?  No  Does the patient have any non-healing wounds?  No  Has the patient had any unintentional weight loss or weight gain?  No   Diabetes:  Is the patient diabetic?  Yes  If diabetic, was a CBG obtained today?  No  Did the patient bring in their glucometer from home?  No  How often do you monitor your CBG's? N/a.   Financial Strains and Diabetes Management:  Are you having any financial strains with the device, your supplies or your medication? No .  Does the patient want to be seen by Chronic Care Management for management of their diabetes?  No  Would the patient like to be referred to a Nutritionist or for Diabetic Management?  No   Diabetic Exams:  Diabetic Eye Exam: Completed 08/26/22 Diabetic Foot Exam: Completed 09/08/22   Interpreter Needed?: No  Information entered by :: Charlott Rakes, LPN   Activities of Daily Living    02/10/2023   11:16 AM  In your present state of health, do you have any difficulty performing  the following activities:  Hearing? 0  Vision? 0  Difficulty concentrating or making decisions? 0  Walking or climbing stairs? 0  Dressing or bathing? 0  Doing errands, shopping? 0  Preparing Food and eating ? N  Using the Toilet? N  In the past six months, have you accidently leaked urine? Y  Comment at times   Do you have problems with loss of bowel control? N  Managing your Medications? N  Managing your Finances? N  Housekeeping or managing your Housekeeping? N    Patient Care Team: Janith Lima, MD as PCP - General Gwenlyn Found Pearletha Forge, MD as PCP - Cardiology (Cardiology) Dohmeier, Asencion Partridge, MD as Consulting Physician (Neurology) Heath Lark, MD as Consulting Physician (Hematology and Oncology) Szabat, Darnelle Maffucci, [facility] (Inactive) (Pharmacist)  Indicate any recent Medical Services you may have received from other than [facility] providers in the past year (date may be approximate).     Assessment:   This is a routine wellness examination for [place].  Hearing/Vision screen Hearing Screening - Comments:: Pt denies any hearing issues  Vision Screening - Comments:: Pt follows up with dr Katy Fitch for annual eye exams   Dietary issues and exercise activities discussed: Current Exercise Habits: The patient does not participate in regular exercise at present   Goals Addressed             This Visit's Progress    Patient Stated       Lose weight        Depression Screen    02/10/2023   11:11 AM 05/05/2022    1:21 PM 02/02/2022    2:23 PM 02/27/2021   11:08 AM 05/27/2020    1:15 PM 03/18/2020   12:44 PM 03/08/2020   11:09 AM  PHQ 2/9 Scores  PHQ - 2 Score 1 0 '1 4 2 3 6  '$ PHQ- 9 Score  0 '4 16 12 15 21    '$ Fall Risk    02/10/2023   11:15 AM 05/05/2022    1:21 PM 03/06/2022    4:41 PM 02/27/2021   11:10 AM 03/18/2020   12:42 PM  [place] in the past year? 0 0 0 1 0  Number falls in past yr: 0 0  0 0  Injury with Fall? 0 0 0 0 0  Risk for fall due to : Impaired balance/gait;Impaired mobility;Impaired vision   No Fall Risks No Fall Risks  Follow up Falls prevention discussed   Falls evaluation completed Education provided;Falls prevention discussed    FALL RISK PREVENTION PERTAINING TO THE HOME:  Any stairs in or around the home? Yes  If so, are there any without handrails? No   Home free of loose throw rugs in walkways, pet beds, electrical cords, etc? Yes  Adequate lighting in your home to reduce risk of falls? Yes   ASSISTIVE DEVICES UTILIZED TO PREVENT FALLS:  Life alert? No  Use of a cane, walker or w/c? Yes  Grab bars in the bathroom? No  Shower chair or bench in shower? Yes  Elevated toilet seat or a handicapped toilet? No   TIMED UP AND GO:  Was the test performed? No .   Cognitive Function:see note below    04/29/2015   11:57 AM  MMSE - Mini Mental State Exam  Not completed: Unable to complete      01/26/2023   10:48 AM 08/12/2022    2:32 PM  Montreal Cognitive Assessment   Visuospatial/ Executive (0/5) 4  2  Naming (0/3) 3 3  Attention: Read list of digits (0/2) 2 1  Attention: Read list of letters (0/1) 1 1  Attention: Serial 7 subtraction starting at 100 (0/3) 3 1  Language: Repeat phrase (0/2) 2 2  Language : Fluency (0/1) 1 0  Abstraction (0/2) 2 2  Delayed Recall (0/5) 4 3  Orientation (0/6) 6 6  Total 28 21      Immunizations Immunization History  Administered Date(s) Administered   Fluad Quad(high Dose 65+) 09/13/2019, 09/25/2021, 09/08/2022   Influenza Split 09/21/2011, 08/19/2012   Influenza Whole 09/25/2009, 09/04/2010   Influenza, High Dose Seasonal PF 09/23/2016, 10/06/2017, 08/18/2018   Influenza,inj,Quad PF,6+ Mos 09/01/2013, 11/29/2014, 08/30/2015, 08/13/2020   PFIZER(Purple Top)SARS-COV-2 Vaccination 02/11/2020, 03/06/2020, 09/19/2020   Pneumococcal Conjugate-13 07/13/2014   Pneumococcal Polysaccharide-23 01/20/2013, 04/18/2018   Td 02/05/2010   Tdap 06/02/2017   Zoster Recombinat (Shingrix) 08/18/2018, 01/04/2019    TDAP status: Up to date  Flu Vaccine status: Up to date  Pneumococcal vaccine status: Up to date  Covid-19 vaccine status: Completed vaccines  Qualifies for Shingles Vaccine? Yes   Zostavax completed Yes   Shingrix Completed?: Yes  Screening Tests Health Maintenance  Topic Date Due    COVID-19 Vaccine (4 - 2023-24 season) 08/14/2022   HEMOGLOBIN A1C  08/10/2023   OPHTHALMOLOGY EXAM  08/27/2023   Diabetic kidney evaluation - Urine ACR  09/09/2023   FOOT EXAM  09/09/2023   Diabetic kidney evaluation - eGFR measurement  02/10/2024   Medicare Annual Wellness (AWV)  02/11/2024   MAMMOGRAM  10/14/2024   DTaP/Tdap/Td (3 - Td or Tdap) 06/03/2027   COLONOSCOPY (Pts 45-32yr Insurance coverage will need to be confirmed)  02/12/2028   Pneumonia Vaccine 75 Years old  Completed   INFLUENZA VACCINE  Completed   DEXA SCAN  Completed   Hepatitis C Screening  Completed   Zoster Vaccines- Shingrix  Completed   HPV VACCINES  Aged Out    Health Maintenance  Health Maintenance Due  Topic Date Due   COVID-19 Vaccine (4 - 2023-24 season) 08/14/2022    Colorectal cancer screening: Type of screening: Colonoscopy. Completed 02/11/18. Repeat every 10 years  Mammogram status: Completed 10/14/22. Repeat every year  Bone Density status: Completed 05/02/20. Results reflect: Bone density results: OSTEOPENIA. Repeat every 2 years.   Additional Screening:  Hepatitis C Screening:  Completed 02/05/11  Vision Screening: Recommended annual ophthalmology exams for early detection of glaucoma and other disorders of the eye. Is the patient up to date with their annual eye exam?  Yes  Who is the provider or what is the name of the office in which the patient attends annual eye exams? Dr GKaty FitchIf pt is not established with a provider, would they like to be referred to a provider to establish care? No .   Dental Screening: Recommended annual dental exams for proper oral hygiene  Community Resource Referral / Chronic Care Management: CRR required this visit?  No   CCM required this visit?  No      Plan:     I have personally reviewed and noted the following in the patient's chart:   Medical and social history Use of alcohol, tobacco or illicit drugs  Current medications and supplements  including opioid prescriptions. Patient is currently taking opioid prescriptions. Information provided to patient regarding non-opioid alternatives. Patient advised to discuss non-opioid treatment plan with their provider. Functional ability and status Nutritional status Physical activity Advanced directives List of other physicians Hospitalizations, surgeries,  and ER visits in previous 12 months Vitals Screenings to include cognitive, depression, and falls Referrals and appointments  In addition, I have reviewed and discussed with patient certain preventive protocols, quality metrics, and best practice recommendations. A written personalized care plan for preventive services as well as general preventive health recommendations were provided to patient.     Willette Brace, LPN   [id number]   Nurse Notes: Pt declined cognition testing stated she just completed neuro testing at [facility] a couple weeks ago and information can be sent over.

## 2023-02-12 MED ORDER — ONETOUCH VERIO FLEX SYSTEM DEVI: 1.0000 | Freq: Two times a day (BID) | 3 refills | Status: AC

## 2023-02-12 MED ORDER — ONETOUCH VERIO VI STRP: 1.0000 | ORAL_STRIP | Freq: Two times a day (BID) | 1 refills | Status: DC

## 2023-02-12 MED ORDER — ONETOUCH VERIO VI LIQD: 1.0000 | Freq: Every day | 5 refills | Status: AC

## 2023-02-24 ENCOUNTER — Ambulatory Visit (INDEPENDENT_AMBULATORY_CARE_PROVIDER_SITE_OTHER): Payer: PPO | Admitting: *Deleted

## 2023-02-24 ENCOUNTER — Other Ambulatory Visit: Payer: Self-pay | Admitting: Internal Medicine

## 2023-02-24 DIAGNOSIS — E538 Deficiency of other specified B group vitamins: Secondary | ICD-10-CM | POA: Diagnosis not present

## 2023-02-24 DIAGNOSIS — E118 Type 2 diabetes mellitus with unspecified complications: Secondary | ICD-10-CM

## 2023-02-24 MED ORDER — SYNJARDY 12.5-500 MG PO TABS: 1.0000 | ORAL_TABLET | Freq: Two times a day (BID) | ORAL | 1 refills | Status: DC

## 2023-02-24 MED ORDER — CYANOCOBALAMIN 1000 MCG/ML IJ SOLN
1000.0000 ug | Freq: Once | INTRAMUSCULAR | Status: AC
  Administered 2023-02-24: 1000 ug via INTRAMUSCULAR

## 2023-02-24 NOTE — Progress Notes (Signed)
Pls cosign for B12 inj../lmb  

## 2023-02-26 ENCOUNTER — Other Ambulatory Visit: Payer: Self-pay | Admitting: Internal Medicine

## 2023-02-26 DIAGNOSIS — I10 Essential (primary) hypertension: Secondary | ICD-10-CM

## 2023-03-22 DIAGNOSIS — Z6836 Body mass index (BMI) 36.0-36.9, adult: Secondary | ICD-10-CM | POA: Diagnosis not present

## 2023-03-22 DIAGNOSIS — J4489 Other specified chronic obstructive pulmonary disease: Secondary | ICD-10-CM | POA: Diagnosis not present

## 2023-03-22 DIAGNOSIS — E1122 Type 2 diabetes mellitus with diabetic chronic kidney disease: Secondary | ICD-10-CM | POA: Diagnosis not present

## 2023-03-22 DIAGNOSIS — N1831 Chronic kidney disease, stage 3a: Secondary | ICD-10-CM | POA: Diagnosis not present

## 2023-03-22 DIAGNOSIS — I129 Hypertensive chronic kidney disease with stage 1 through stage 4 chronic kidney disease, or unspecified chronic kidney disease: Secondary | ICD-10-CM | POA: Diagnosis not present

## 2023-03-22 DIAGNOSIS — Z7984 Long term (current) use of oral hypoglycemic drugs: Secondary | ICD-10-CM | POA: Diagnosis not present

## 2023-03-22 DIAGNOSIS — F330 Major depressive disorder, recurrent, mild: Secondary | ICD-10-CM | POA: Diagnosis not present

## 2023-03-29 ENCOUNTER — Ambulatory Visit (INDEPENDENT_AMBULATORY_CARE_PROVIDER_SITE_OTHER): Payer: PPO

## 2023-03-29 DIAGNOSIS — E538 Deficiency of other specified B group vitamins: Secondary | ICD-10-CM

## 2023-03-29 MED ORDER — CYANOCOBALAMIN 1000 MCG/ML IJ SOLN
1000.0000 ug | Freq: Once | INTRAMUSCULAR | Status: AC
  Administered 2023-03-29: 1000 ug via INTRAMUSCULAR

## 2023-03-29 NOTE — Progress Notes (Signed)
After obtaining consent, and per orders of Dr. Jones, injection of B12 was given by [place] Rylander P Lior Hoen. Patient instructed to report any adverse reaction to me immediately.  

## 2023-03-30 DIAGNOSIS — G4733 Obstructive sleep apnea (adult) (pediatric): Secondary | ICD-10-CM | POA: Diagnosis not present

## 2023-03-30 DIAGNOSIS — G479 Sleep disorder, unspecified: Secondary | ICD-10-CM | POA: Diagnosis not present

## 2023-04-01 ENCOUNTER — Encounter: Payer: Self-pay | Admitting: Physician Assistant

## 2023-04-01 ENCOUNTER — Ambulatory Visit (INDEPENDENT_AMBULATORY_CARE_PROVIDER_SITE_OTHER): Payer: PPO | Admitting: Physician Assistant

## 2023-04-01 VITALS — BP 114/62 | HR 84 | Ht 66.0 in | Wt 224.4 lb

## 2023-04-01 DIAGNOSIS — K219 Gastro-esophageal reflux disease without esophagitis: Secondary | ICD-10-CM

## 2023-04-01 DIAGNOSIS — Z86010 Personal history of colonic polyps: Secondary | ICD-10-CM | POA: Diagnosis not present

## 2023-04-01 MED ORDER — NA SULFATE-K SULFATE-MG SULF 17.5-3.13-1.6 GM/177ML PO SOLN: 1.0000 | Freq: Once | ORAL | 0 refills | Status: AC

## 2023-04-01 NOTE — Progress Notes (Signed)
Chief Complaint: Discuss colonoscopy and EGD  HPI:    Valerie [name] is a 77 year old African-American female with a past medical history as listed below including anxiety, depression, GERD, family history of colon cancer in her brother, obesity and multiple others listed below, previously known to Dr. Christella Hartigan, who was referred to me by Etta Grandchild, MD for discussion of a colonoscopy and EGD.    11/23/2006 colonoscopy for anemia and hematochezia was normal with only hemorrhoids.  It was noted she had a family history of colon cancer in her brother who died in the 63s and needed a repeat exam in 5 years.    02/11/2018 EGD for iron deficiency anemia was normal.    02/11/2018 colonoscopy for hematochezia and iron deficiency anemia Dr. Elnoria Howard, findings of one 3 mm polyp in the transverse colon and diverticulosis in the sigmoid and ascending colon.  Repeat recommended in 5 years for surveillance.    07/02/2020 patient seen by me for bloating gas belching and trouble with dysphagia.  At that time discussed previous EGD and colonoscopy with Dr. Elnoria Howard.  We did not have reports.  At that point switched her to Pantoprazole 40 twice daily.    Today, patient presents to clinic and tells me that she wants to schedule her colonoscopy.  She does not have any acute GI complaints or concerns.  She has occasional reflux based off what she eats with the Pantoprazole 40 twice BID and Pepcid 40 mg nightly are helpful.  Still has some bloating and eructations.  But these really do not bother her.    We did discuss complications from EGD.  Apparently patient lost her voice for months after her last EGD and eventually followed with Dr. Ezzard Standing who said possibly a nerve was nicked at time of procedure.  Her voice eventually came back.    Denies fever, chills, weight loss, blood in her stool, nausea, vomiting or abdominal pain.  Past Medical History:  Diagnosis Date   Anemia    Anxiety and depression    Arthritis    Asthma     Cataract    Degenerative arthritis    Depression    Diabetes mellitus, type 2    Fatigue    GERD (gastroesophageal reflux disease)    Glaucoma    Heart palpitations    Hemorrhoids    Hyperlipidemia    Hypertension    Hypothyroidism    Hypoxemia 11/24/2013   Memory deficit 10/04/2013   Obesity    Sleep apnea    BiPAP   Snoring disorder     Past Surgical History:  Procedure Laterality Date   benign tumors resected     CATARACT EXTRACTION Bilateral    FOOT SURGERY Left    bone spur   ROTATOR CUFF REPAIR Left    TUBAL LIGATION      Current Outpatient Medications  Medication Sig Dispense Refill   albuterol (PROVENTIL) (2.5 MG/3ML) 0.083% nebulizer solution Take 3 mLs (2.5 mg total) by nebulization every 6 (six) hours as needed for wheezing or shortness of breath. 75 mL 3   albuterol (VENTOLIN HFA) 108 (90 Base) MCG/ACT inhaler Inhale 2 puffs into the lungs every 6 (six) hours as needed. For shortness of breath 3 each 4   aspirin 81 MG tablet Take 81 mg by mouth daily.      atorvastatin (LIPITOR) 20 MG tablet Take 1 tablet (20 mg total) by mouth daily. 90 tablet 1   Blood Glucose Calibration (ONETOUCH VERIO) LIQD  1 Act by In Vitro route daily. 1 each 5   Blood Glucose Monitoring Suppl (ONETOUCH VERIO FLEX SYSTEM) DEVI 1 Act by Does not apply route 2 (two) times daily. 1 each 3   budesonide-formoterol (SYMBICORT) 80-4.5 MCG/ACT inhaler Inhale 2 puffs into the lungs 2 (two) times daily as needed. 3 each 1   carvedilol (COREG) 25 MG tablet TAKE 1 TABLET BY MOUTH TWICE A DAY WITH FOOD 180 tablet 0   chlorthalidone (HYGROTON) 25 MG tablet TAKE 1 TABLET (25 MG TOTAL) BY MOUTH DAILY. 90 tablet 0   Cholecalciferol (VITAMIN D3 SUPER STRENGTH) 50 MCG (2000 UT) TABS Take 1 tablet (2,000 Units total) by mouth daily. 90 tablet 1   clobetasol ointment (TEMOVATE) 0.05 % Apply 1 application topically 2 (two) times daily. 60 g 1   Continuous Blood Gluc Receiver (FREESTYLE LIBRE 2 READER) DEVI 1  Act by Does not apply route daily. 2 each 5   Continuous Blood Gluc Sensor (FREESTYLE LIBRE 2 SENSOR) MISC 1 Act by Does not apply route daily. 2 each 5   Dulaglutide (TRULICITY) 1.5 MG/0.5ML SOPN Inject 1.5 mg into the skin once a week. 6 mL 0   Empagliflozin-metFORMIN HCl (SYNJARDY) 12.5-500 MG TABS Take 1 tablet by mouth 2 (two) times daily. 180 tablet 1   famotidine (PEPCID) 40 MG tablet TAKE 1 TABLET BY MOUTH EVERY DAY 90 tablet 1   ferrous sulfate 325 (65 FE) MG tablet TAKE 1 TABLET BY MOUTH 3 TIMES DAILY WITH MEALS. 270 tablet 0   Finerenone (KERENDIA) 20 MG TABS Take 1 tablet by mouth daily. Via Patient Assistance Health and safety inspector) 90 tablet 3   gabapentin (NEURONTIN) 300 MG capsule TAKE 1 CAPSULE BY MOUTH THREE TIMES A DAY 270 capsule 1   glucose blood (FREESTYLE TEST STRIPS) test strip USE TO TEST BLOOD SUGAR 3 TIMES A DAY. DX: E11.8 300 strip 1   glucose blood (ONETOUCH VERIO) test strip 1 each by Other route 2 (two) times daily. Use as instructed 200 strip 1   irbesartan (AVAPRO) 300 MG tablet Take 0.5 tablets (150 mg total) by mouth daily. 45 tablet 1   latanoprost (XALATAN) 0.005 % ophthalmic solution Place 1 drop into both eyes at bedtime.      oxyCODONE-acetaminophen (PERCOCET/ROXICET) 5-325 MG tablet Take 1 tablet by mouth every 8 (eight) hours as needed for severe pain. 90 tablet 0   pantoprazole (PROTONIX) 40 MG tablet TAKE 1 TABLET BY MOUTH 2 TIMES DAILY BEFORE A MEAL 180 tablet 0   potassium chloride SA (KLOR-CON M20) 20 MEQ tablet TAKE 1 TABLET BY MOUTH TWICE A DAY 180 tablet 1   No current facility-administered medications for this visit.    Allergies as of 04/01/2023 - Review Complete 04/01/2023  Allergen Reaction Noted   Food Swelling 05/04/2012   Penicillins Swelling and Rash     Family History  Problem Relation Age of Onset   Alcohol abuse Mother    Heart attack Father    Hypertension Father    Hyperlipidemia Father    Heart disease Sister    Atrial fibrillation  Sister    Hypertension Sister    Hyperlipidemia Sister    Coronary artery disease Brother    Heart attack Brother    Hypertension Brother    Hyperlipidemia Brother    Colon cancer Brother 51   Hypertension Brother    Hyperlipidemia Brother    Dementia Brother    Diabetes Son    Hypertension Son    Hyperlipidemia Son  Diabetes Son    Hypertension Son    Hyperlipidemia Son    Cerebral palsy Son    Hypertension Son    Hyperlipidemia Son    Hypertension Other        family history   Alcohol abuse Other    Esophageal cancer Neg Hx    Stomach cancer Neg Hx    Pancreatic cancer Neg Hx    Liver disease Neg Hx     Social History   Socioeconomic History   Marital status: Widowed    Spouse name: Not on file   Number of children: 3   Years of education: 11th   Highest education level: 11th grade  Occupational History   Occupation: disabled  Tobacco Use   Smoking status: Never    Passive exposure: Never   Smokeless tobacco: Never  Vaping Use   Vaping Use: Never used  Substance and Sexual Activity   Alcohol use: No    Alcohol/week: 0.0 standard drinks of alcohol   Drug use: No   Sexual activity: Not Currently    Partners: Male    Comment: not working, lives with husband, 3 sons  Other Topics Concern   Not on file  Social History Narrative   Client's spouse recently passed Feb 2021      Lives with 2 Son and 1 granddaughter   R handed   Caffeine: ocas., has cut back and drinking more water   Social Determinants of Health   Financial Resource Strain: Low Risk  (02/10/2023)   Overall Financial Resource Strain (CARDIA)    Difficulty of Paying Living Expenses: Not hard at all  Food Insecurity: No Food Insecurity (02/10/2023)   Hunger Vital Sign    Worried About Running Out of Food in the Last Year: Never true    Ran Out of Food in the Last Year: Never true  Transportation Needs: No Transportation Needs (02/10/2023)   PRAPARE - Scientist, research (physical sciences) (Medical): No    Lack of Transportation (Non-Medical): No  Physical Activity: Inactive (02/10/2023)   Exercise Vital Sign    Days of Exercise per Week: 0 days    Minutes of Exercise per Session: 0 min  Stress: No Stress Concern Present (02/10/2023)   [employer] of Occupational Health - Occupational Stress Questionnaire    Feeling of Stress : Only a little  Social Connections: Moderately Isolated (02/10/2023)   Social Connection and Isolation Panel [NHANES]    Frequency of Communication with Friends and Family: More than three times a week    Frequency of Social Gatherings with Friends and Family: Once a week    Attends Religious Services: 1 to 4 times per year    Active Member of [employer] or Organizations: No    Attends Banker Meetings: Never    Marital Status: Widowed  Intimate Partner Violence: Not At Risk (02/10/2023)   Humiliation, Afraid, Rape, and Kick questionnaire    Fear of Current or Ex-Partner: No    Emotionally Abused: No    Physically Abused: No    Sexually Abused: No    Review of Systems:    Constitutional: No weight loss, fever or chills Cardiovascular: No chest pain  Respiratory: No SOB  Gastrointestinal: See HPI and otherwise negative   Physical Exam:  Vital signs: BP 114/62   Pulse 84   Ht 5\' 6"  (1.676 m)   Wt 224 lb 6.4 oz (101.8 kg)   BMI 36.22 kg/m    Constitutional:  Pleasant obese AA female appears to be in NAD, Well developed, Well nourished, alert and cooperative Respiratory: Respirations even and unlabored. Lungs clear to auscultation bilaterally.   No wheezes, crackles, or rhonchi.  Cardiovascular: Normal S1, S2. No MRG. Regular rate and rhythm. No peripheral edema, cyanosis or pallor.  Gastrointestinal:  Soft, nondistended, nontender. No rebound or guarding. Normal bowel sounds. No appreciable masses or hepatomegaly. Rectal:  Not performed.  Psychiatric: Oriented to person, place and time. Demonstrates good  judgement and reason without abnormal affect or behaviors.  RELEVANT LABS AND IMAGING: CBC    Component Value Date/Time   WBC 10.3 02/09/2023 1343   RBC 3.78 (L) 02/09/2023 1343   HGB 11.3 (L) 02/09/2023 1343   HCT 33.9 (L) 02/09/2023 1343   PLT 357.0 02/09/2023 1343   MCV 89.5 02/09/2023 1343   MCH 29.7 07/01/2020 1143   MCHC 33.4 02/09/2023 1343   RDW 15.8 (H) 02/09/2023 1343   LYMPHSABS 3.5 02/09/2023 1343   MONOABS 1.1 (H) 02/09/2023 1343   EOSABS 0.4 02/09/2023 1343   BASOSABS 0.1 02/09/2023 1343    CMP     Component Value Date/Time   NA 140 02/09/2023 1343   NA 143 03/30/2022 1007   K 3.7 02/09/2023 1343   CL 101 02/09/2023 1343   CO2 29 02/09/2023 1343   GLUCOSE 99 02/09/2023 1343   BUN 14 02/09/2023 1343   BUN 14 03/30/2022 1007   CREATININE 1.13 02/09/2023 1343   CALCIUM 9.8 02/09/2023 1343   PROT 7.4 02/09/2023 1343   ALBUMIN 4.0 02/09/2023 1343   AST 13 02/09/2023 1343   ALT 15 02/09/2023 1343   ALKPHOS 101 02/09/2023 1343   BILITOT 0.4 02/09/2023 1343   GFRNONAA >60 08/29/2015 0630   GFRAA >60 08/29/2015 0630    Assessment: 1.  History of adenomatous polyps: Last colonoscopy in 2019 with repeat recommended in 5 years 2.  GERD: Controlled on Pantoprazole 40 twice daily and Famotidine 40 mg nightly  Plan: 1.  Patient scheduled for a surveillance colonoscopy given history of adenomatous polyps in the [facility] with [facility].  Patient selected this appointment after being given options for first available.  Did provide the patient a detailed list risk procedure and she agrees to proceed.  She will continue to follow with Dr. Adela Lank after time procedures her primary GI physician here in the of Dr. Christella Hartigan absence. 2.  Continue Pantoprazole and Famotidine as prescribed 3.  Patient will follow up per recommendations after time of colonoscopy.  Hyacinth Meeker, PA-C [facility] 04/01/2023, 2:09 PM  Cc: Etta Grandchild, MD

## 2023-04-01 NOTE — Progress Notes (Signed)
Based on updated guidelines, patient could wait another 2 years for her next colonoscopy, however if she is anxious about it can proceed at this time if that is her preference. Either way, given age, if no high lesions on her next exam it will likely be her last.

## 2023-04-01 NOTE — Patient Instructions (Addendum)
You have been scheduled for a colonoscopy. Please follow written instructions given to you at your visit today.  Please pick up your prep supplies at the pharmacy within the next 1-3 days. If you use inhalers (even only as needed), please bring them with you on the day of your procedure. _______________________________________________________  If your blood pressure at your visit was 140/90 or greater, please contact your primary care physician to follow up on this.  _______________________________________________________  If you are age 63 or older, your body mass index should be between 23-30. Your Body mass index is 36.22 kg/m. If this is out of the aforementioned range listed, please consider follow up with your Primary Care Provider.  If you are age 17 or younger, your body mass index should be between 19-25. Your Body mass index is 36.22 kg/m. If this is out of the aformentioned range listed, please consider follow up with your Primary Care Provider.   ________________________________________________________  The [facility] providers would like to encourage you to use [facility] to communicate with providers for non-urgent requests or questions.  Due to long hold times on the telephone, sending your provider a message by [facility] may be a faster and more efficient way to get a response.  Please allow 48 business hours for a response.  Please remember that this is for non-urgent requests.  _______________________________________________________  Due to recent changes in healthcare laws, you may see the results of your imaging and laboratory studies on MyChart before your provider has had a chance to review them.  We understand that in some cases there may be results that are confusing or concerning to you. Not all laboratory results come back in the same time frame and the provider may be waiting for multiple results in order to interpret others.  Please give [place] 48 hours in order for your  provider to thoroughly review all the results before contacting the office for clarification of your results.

## 2023-04-09 DIAGNOSIS — H33193 Other retinoschisis and retinal cysts, bilateral: Secondary | ICD-10-CM | POA: Diagnosis not present

## 2023-04-09 DIAGNOSIS — Z961 Presence of intraocular lens: Secondary | ICD-10-CM | POA: Diagnosis not present

## 2023-04-09 DIAGNOSIS — H401132 Primary open-angle glaucoma, bilateral, moderate stage: Secondary | ICD-10-CM | POA: Diagnosis not present

## 2023-04-13 ENCOUNTER — Encounter: Payer: Self-pay | Admitting: Gastroenterology

## 2023-04-18 ENCOUNTER — Other Ambulatory Visit: Payer: Self-pay | Admitting: Internal Medicine

## 2023-04-20 ENCOUNTER — Telehealth: Payer: Self-pay | Admitting: *Deleted

## 2023-04-20 NOTE — Telephone Encounter (Signed)
Hey Amy I pulled more recent data below. Please let me know if you want me tell her anything different?

## 2023-04-20 NOTE — Telephone Encounter (Signed)
-----   Message from [name], NP sent at 04/20/2023  2:40 PM EDT ----- Can you guys check on her and make sure there is no difficulty with BiPAP therapy. She was doing great when I saw her last but I received a new report from 12/2022-01/2023 showing sub optimal compliance. I am happy to see her if needed.

## 2023-04-22 ENCOUNTER — Ambulatory Visit (AMBULATORY_SURGERY_CENTER): Payer: PPO | Admitting: Gastroenterology

## 2023-04-22 ENCOUNTER — Encounter: Payer: Self-pay | Admitting: Gastroenterology

## 2023-04-22 VITALS — BP 118/68 | HR 67 | Temp 97.8°F | Resp 11 | Ht 66.0 in | Wt 224.0 lb

## 2023-04-22 DIAGNOSIS — E119 Type 2 diabetes mellitus without complications: Secondary | ICD-10-CM | POA: Diagnosis not present

## 2023-04-22 DIAGNOSIS — Z09 Encounter for follow-up examination after completed treatment for conditions other than malignant neoplasm: Secondary | ICD-10-CM | POA: Diagnosis not present

## 2023-04-22 DIAGNOSIS — D123 Benign neoplasm of transverse colon: Secondary | ICD-10-CM

## 2023-04-22 DIAGNOSIS — Z800 Family history of malignant neoplasm of digestive organs: Secondary | ICD-10-CM

## 2023-04-22 DIAGNOSIS — E039 Hypothyroidism, unspecified: Secondary | ICD-10-CM | POA: Diagnosis not present

## 2023-04-22 DIAGNOSIS — J45909 Unspecified asthma, uncomplicated: Secondary | ICD-10-CM | POA: Diagnosis not present

## 2023-04-22 DIAGNOSIS — G4730 Sleep apnea, unspecified: Secondary | ICD-10-CM | POA: Diagnosis not present

## 2023-04-22 DIAGNOSIS — E669 Obesity, unspecified: Secondary | ICD-10-CM | POA: Diagnosis not present

## 2023-04-22 DIAGNOSIS — F418 Other specified anxiety disorders: Secondary | ICD-10-CM | POA: Diagnosis not present

## 2023-04-22 DIAGNOSIS — Z86010 Personal history of colonic polyps: Secondary | ICD-10-CM | POA: Diagnosis not present

## 2023-04-22 DIAGNOSIS — D120 Benign neoplasm of cecum: Secondary | ICD-10-CM

## 2023-04-22 MED ORDER — SODIUM CHLORIDE 0.9 % IV SOLN
500.0000 mL | Freq: Once | INTRAVENOUS | Status: DC
Start: 2023-04-22 — End: 2023-04-22

## 2023-04-22 NOTE — Progress Notes (Signed)
[facility] History and Physical   Primary Care Physician:  Etta Grandchild, MD   Reason for Procedure:   History of colon polyps, family history of colon cancer  Plan:    colonoscopy     HPI: Valerie [name] is a 77 y.o. female  here for colonoscopy surveillance - history of 3 adenomas removed 01/2016. Brother had CRC d x age 23.   Patient denies any bowel symptoms at this time. Otherwise feels well without any cardiopulmonary symptoms.   I have discussed risks / benefits of anesthesia and endoscopic procedure with Valerie [name] and they wish to proceed with the exams as outlined today.    Past Medical History:  Diagnosis Date   Anemia    Anxiety and depression    Arthritis    Asthma    Cataract    Degenerative arthritis    Depression    Diabetes mellitus, type 2 (HCC)    Fatigue    GERD (gastroesophageal reflux disease)    Glaucoma    Heart palpitations    Hemorrhoids    Hyperlipidemia    Hypertension    Hypothyroidism    Hypoxemia 11/24/2013   Memory deficit 10/04/2013   Obesity    Sleep apnea    BiPAP   Snoring disorder     Past Surgical History:  Procedure Laterality Date   benign tumors resected     CATARACT EXTRACTION Bilateral    FOOT SURGERY Left    bone spur   ROTATOR CUFF REPAIR Left    TUBAL LIGATION      Prior to Admission medications   Medication Sig Start Date End Date Taking? Authorizing Provider  albuterol (VENTOLIN HFA) 108 (90 Base) MCG/ACT inhaler Inhale 2 puffs into the lungs every 6 (six) hours as needed. For shortness of breath 09/08/22  Yes Etta Grandchild, MD  aspirin 81 MG tablet Take 81 mg by mouth daily.    Yes [provider]  atorvastatin (LIPITOR) 20 MG tablet Take 1 tablet (20 mg total) by mouth daily. 09/08/22  Yes Etta Grandchild, MD  Blood Glucose Calibration (ONETOUCH VERIO) LIQD 1 Act by In Vitro route daily. 02/12/23  Yes Etta Grandchild, MD  Blood Glucose Monitoring Suppl (ONETOUCH VERIO FLEX  SYSTEM) DEVI 1 Act by Does not apply route 2 (two) times daily. 02/12/23  Yes Etta Grandchild, MD  budesonide-formoterol [facility]) 80-4.5 MCG/ACT inhaler Inhale 2 puffs into the lungs 2 (two) times daily as needed. 09/08/22  Yes Etta Grandchild, MD  carvedilol (COREG) 25 MG tablet TAKE 1 TABLET BY MOUTH TWICE A DAY WITH FOOD 01/12/23  Yes Etta Grandchild, MD  chlorthalidone (HYGROTON) 25 MG tablet TAKE 1 TABLET (25 MG TOTAL) BY MOUTH DAILY. 02/26/23  Yes Etta Grandchild, MD  Cholecalciferol (VITAMIN D3 SUPER STRENGTH) 50 MCG (2000 UT) TABS Take 1 tablet (2,000 Units total) by mouth daily. 09/08/22  Yes Etta Grandchild, MD  Continuous Blood Gluc Receiver (FREESTYLE LIBRE 2 READER) DEVI 1 Act by Does not apply route daily. 09/08/22  Yes Etta Grandchild, MD  Continuous Blood Gluc Sensor (FREESTYLE LIBRE 2 SENSOR) MISC 1 Act by Does not apply route daily. 09/08/22  Yes Etta Grandchild, MD  Dulaglutide (TRULICITY) 1.5 MG/0.5ML SOPN Inject 1.5 mg into the skin once a week. 02/09/23  Yes Etta Grandchild, MD  Empagliflozin-metFORMIN HCl (SYNJARDY) 12.5-500 MG TABS Take 1 tablet by mouth 2 (two) times daily. 02/24/23  Yes Sanda Linger  L, MD  famotidine (PEPCID) 40 MG tablet TAKE 1 TABLET BY MOUTH EVERY DAY 01/06/23  Yes Etta Grandchild, MD  ferrous sulfate 325 (65 FE) MG tablet TAKE 1 TABLET BY MOUTH 3 TIMES DAILY WITH MEALS. 08/02/22  Yes Etta Grandchild, MD  Finerenone (KERENDIA) 20 MG TABS Take 1 tablet by mouth daily. Via Patient Assistance [employer]) 11/09/22  Yes Etta Grandchild, MD  gabapentin (NEURONTIN) 300 MG capsule TAKE 1 CAPSULE BY MOUTH THREE TIMES A DAY 09/08/22  Yes Etta Grandchild, MD  glucose blood (FREESTYLE TEST STRIPS) test strip USE TO TEST BLOOD SUGAR 3 TIMES A DAY. DX: E11.8 02/06/21  Yes Etta Grandchild, MD  glucose blood (ONETOUCH VERIO) test strip 1 each by Other route 2 (two) times daily. Use as instructed 02/12/23  Yes Etta Grandchild, MD  latanoprost (XALATAN) 0.005 % ophthalmic solution Place 1  drop into both eyes at bedtime.    Yes [provider]  pantoprazole (PROTONIX) 40 MG tablet TAKE 1 TABLET BY MOUTH 2 TIMES DAILY BEFORE A MEAL 04/18/23  Yes Etta Grandchild, MD  potassium chloride SA (KLOR-CON M20) 20 MEQ tablet TAKE 1 TABLET BY MOUTH TWICE A DAY 10/21/22  Yes Etta Grandchild, MD  albuterol (PROVENTIL) (2.5 MG/3ML) 0.083% nebulizer solution Take 3 mLs (2.5 mg total) by nebulization every 6 (six) hours as needed for wheezing or shortness of breath. 04/13/22   Etta Grandchild, MD  clobetasol ointment (TEMOVATE) 0.05 % Apply 1 application topically 2 (two) times daily. 09/17/20   Etta Grandchild, MD  irbesartan (AVAPRO) 300 MG tablet Take 0.5 tablets (150 mg total) by mouth daily. 09/08/22   Etta Grandchild, MD  oxyCODONE-acetaminophen (PERCOCET/ROXICET) 5-325 MG tablet Take 1 tablet by mouth every 8 (eight) hours as needed for severe pain. 11/09/22   Etta Grandchild, MD    Current Outpatient Medications  Medication Sig Dispense Refill   albuterol (VENTOLIN HFA) 108 (90 Base) MCG/ACT inhaler Inhale 2 puffs into the lungs every 6 (six) hours as needed. For shortness of breath 3 each 4   aspirin 81 MG tablet Take 81 mg by mouth daily.      atorvastatin (LIPITOR) 20 MG tablet Take 1 tablet (20 mg total) by mouth daily. 90 tablet 1   Blood Glucose Calibration (ONETOUCH VERIO) LIQD 1 Act by In Vitro route daily. 1 each 5   Blood Glucose Monitoring Suppl (ONETOUCH VERIO FLEX SYSTEM) DEVI 1 Act by Does not apply route 2 (two) times daily. 1 each 3   budesonide-formoterol (SYMBICORT) 80-4.5 MCG/ACT inhaler Inhale 2 puffs into the lungs 2 (two) times daily as needed. 3 each 1   carvedilol (COREG) 25 MG tablet TAKE 1 TABLET BY MOUTH TWICE A DAY WITH FOOD 180 tablet 0   chlorthalidone (HYGROTON) 25 MG tablet TAKE 1 TABLET (25 MG TOTAL) BY MOUTH DAILY. 90 tablet 0   Cholecalciferol (VITAMIN D3 SUPER STRENGTH) 50 MCG (2000 UT) TABS Take 1 tablet (2,000 Units total) by mouth daily. 90 tablet 1    Continuous Blood Gluc Receiver (FREESTYLE LIBRE 2 READER) DEVI 1 Act by Does not apply route daily. 2 each 5   Continuous Blood Gluc Sensor (FREESTYLE LIBRE 2 SENSOR) MISC 1 Act by Does not apply route daily. 2 each 5   Dulaglutide (TRULICITY) 1.5 MG/0.5ML SOPN Inject 1.5 mg into the skin once a week. 6 mL 0   Empagliflozin-metFORMIN HCl (SYNJARDY) 12.5-500 MG TABS Take 1 tablet by mouth 2 (  two) times daily. 180 tablet 1   famotidine (PEPCID) 40 MG tablet TAKE 1 TABLET BY MOUTH EVERY DAY 90 tablet 1   ferrous sulfate 325 (65 FE) MG tablet TAKE 1 TABLET BY MOUTH 3 TIMES DAILY WITH MEALS. 270 tablet 0   Finerenone (KERENDIA) 20 MG TABS Take 1 tablet by mouth daily. Via Patient Assistance [employer]) 90 tablet 3   gabapentin (NEURONTIN) 300 MG capsule TAKE 1 CAPSULE BY MOUTH THREE TIMES A DAY 270 capsule 1   glucose blood (FREESTYLE TEST STRIPS) test strip USE TO TEST BLOOD SUGAR 3 TIMES A DAY. DX: E11.8 300 strip 1   glucose blood (ONETOUCH VERIO) test strip 1 each by Other route 2 (two) times daily. Use as instructed 200 strip 1   latanoprost (XALATAN) 0.005 % ophthalmic solution Place 1 drop into both eyes at bedtime.      pantoprazole (PROTONIX) 40 MG tablet TAKE 1 TABLET BY MOUTH 2 TIMES DAILY BEFORE A MEAL 180 tablet 0   potassium chloride SA (KLOR-CON M20) 20 MEQ tablet TAKE 1 TABLET BY MOUTH TWICE A DAY 180 tablet 1   albuterol (PROVENTIL) (2.5 MG/3ML) 0.083% nebulizer solution Take 3 mLs (2.5 mg total) by nebulization every 6 (six) hours as needed for wheezing or shortness of breath. 75 mL 3   clobetasol ointment (TEMOVATE) 0.05 % Apply 1 application topically 2 (two) times daily. 60 g 1   irbesartan (AVAPRO) 300 MG tablet Take 0.5 tablets (150 mg total) by mouth daily. 45 tablet 1   oxyCODONE-acetaminophen (PERCOCET/ROXICET) 5-325 MG tablet Take 1 tablet by mouth every 8 (eight) hours as needed for severe pain. 90 tablet 0   Current Facility-Administered Medications  Medication Dose Route  Frequency Provider Last Rate Last Admin   0.9 %  sodium chloride infusion  500 mL Intravenous Once Craige Patel, Willaim Rayas, MD        Allergies as of 04/22/2023 - Review Complete 04/22/2023  Allergen Reaction Noted   Food Swelling 05/04/2012   Penicillins Swelling and Rash     Family History  Problem Relation Age of Onset   Alcohol abuse Mother    Heart attack Father    Hypertension Father    Hyperlipidemia Father    Heart disease Sister    Atrial fibrillation Sister    Hypertension Sister    Hyperlipidemia Sister    Coronary artery disease Brother    Heart attack Brother    Hypertension Brother    Hyperlipidemia Brother    Colon cancer Brother 51   Hypertension Brother    Hyperlipidemia Brother    Dementia Brother    Diabetes Son    Hypertension Son    Hyperlipidemia Son    Diabetes Son    Hypertension Son    Hyperlipidemia Son    Cerebral palsy Son    Hypertension Son    Hyperlipidemia Son    Hypertension Other        family history   Alcohol abuse Other    Esophageal cancer Neg Hx    Stomach cancer Neg Hx    Pancreatic cancer Neg Hx    Liver disease Neg Hx     Social History   Socioeconomic History   Marital status: Widowed    Spouse name: Not on file   Number of children: 3   Years of education: 11th   Highest education level: 11th grade  Occupational History   Occupation: disabled  Tobacco Use   Smoking status: Never    Passive  exposure: Never   Smokeless tobacco: Never  Vaping Use   Vaping Use: Never used  Substance and Sexual Activity   Alcohol use: No    Alcohol/week: 0.0 standard drinks of alcohol   Drug use: No   Sexual activity: Not Currently    Partners: Male    Comment: not working, lives with husband, 3 sons  Other Topics Concern   Not on file  Social History Narrative   Client's spouse recently passed Feb 2021      Lives with 2 Son and 1 granddaughter   R handed   Caffeine: ocas., has cut back and drinking more water   Social  Determinants of Health   Financial Resource Strain: Low Risk  (02/10/2023)   Overall Financial Resource Strain (CARDIA)    Difficulty of Paying Living Expenses: Not hard at all  Food Insecurity: No Food Insecurity (02/10/2023)   Hunger Vital Sign    Worried About Running Out of Food in the Last Year: Never true    Ran Out of Food in the Last Year: Never true  Transportation Needs: No Transportation Needs (02/10/2023)   PRAPARE - Administrator, Civil Service (Medical): No    Lack of Transportation (Non-Medical): No  Physical Activity: Inactive (02/10/2023)   Exercise Vital Sign    Days of Exercise per Week: 0 days    Minutes of Exercise per Session: 0 min  Stress: No Stress Concern Present (02/10/2023)   [employer] of Occupational Health - Occupational Stress Questionnaire    Feeling of Stress : Only a little  Social Connections: Moderately Isolated (02/10/2023)   Social Connection and Isolation Panel [NHANES]    Frequency of Communication with Friends and Family: More than three times a week    Frequency of Social Gatherings with Friends and Family: Once a week    Attends Religious Services: 1 to 4 times per year    Active Member of [employer] or Organizations: No    Attends Banker Meetings: Never    Marital Status: Widowed  Intimate Partner Violence: Not At Risk (02/10/2023)   Humiliation, Afraid, Rape, and Kick questionnaire    Fear of Current or Ex-Partner: No    Emotionally Abused: No    Physically Abused: No    Sexually Abused: No    Review of Systems: All other review of systems negative except as mentioned in the HPI.  Physical Exam: Vital signs BP (!) 159/74   Pulse 75   Temp 97.8 F (36.6 C)   Ht 5\' 6"  (1.676 m)   Wt 224 lb (101.6 kg)   SpO2 99%   BMI 36.15 kg/m   General:   Alert,  Well-developed, pleasant and cooperative in NAD Lungs:  Clear throughout to auscultation.   Heart:  Regular rate and rhythm Abdomen:  Soft, nontender and  nondistended.   Neuro/Psych:  Alert and cooperative. Normal mood and affect. A and O x 3  Harlin Rain, MD [facility]

## 2023-04-22 NOTE — Progress Notes (Signed)
Called to room to assist during endoscopic procedure.  Patient ID and intended procedure confirmed with present staff. Received instructions for my participation in the procedure from the performing physician.  

## 2023-04-22 NOTE — Patient Instructions (Addendum)
Resume previous diet Continue present medications Await pathology results  Handouts/information given for polyps, diverticulosis and hemorrhoids  YOU HAD AN ENDOSCOPIC PROCEDURE TODAY AT THE [facility]:   Refer to the procedure report that was given to you for any specific questions about what was found during the examination.  If the procedure report does not answer your questions, please call your gastroenterologist to clarify.  If you requested that your care partner not be given the details of your procedure findings, then the procedure report has been included in a sealed envelope for you to review at your convenience later.  YOU SHOULD EXPECT: Some feelings of bloating in the abdomen. Passage of more gas than usual.  Walking can help get rid of the air that was put into your GI tract during the procedure and reduce the bloating. If you had a lower endoscopy (such as a colonoscopy or flexible sigmoidoscopy) you may notice spotting of blood in your stool or on the toilet paper. If you underwent a bowel prep for your procedure, you may not have a normal bowel movement for a few days.  Please Note:  You might notice some irritation and congestion in your nose or some drainage.  This is from the oxygen used during your procedure.  There is no need for concern and it should clear up in a day or so.  SYMPTOMS TO REPORT IMMEDIATELY:  Following lower endoscopy (colonoscopy):  Excessive amounts of blood in the stool  Significant tenderness or worsening of abdominal pains  Swelling of the abdomen that is new, acute  Fever of 100F or higher  For urgent or emergent issues, a gastroenterologist can be reached at any hour by calling [phone]. Do not use MyChart messaging for urgent concerns.   DIET:  We do recommend a small meal at first, but then you may proceed to your regular diet.  Drink plenty of fluids but you should avoid alcoholic beverages for 24 hours.  ACTIVITY:  You  should plan to take it easy for the rest of today and you should NOT DRIVE or use heavy machinery until tomorrow (because of the sedation medicines used during the test).    FOLLOW UP: Our staff will call the number listed on your records the next business day following your procedure.  We will call around 7:15- 8:00 am to check on you and address any questions or concerns that you may have regarding the information given to you following your procedure. If we do not reach you, we will leave a message.     If any biopsies were taken you will be contacted by phone or by letter within the next 1-3 weeks.  Please call us at [phone] if you have not heard about the biopsies in 3 weeks.    SIGNATURES/CONFIDENTIALITY: You and/or your care partner have signed paperwork which will be entered into your electronic medical record.  These signatures attest to the fact that that the information above on your After Visit Summary has been reviewed and is understood.  Full responsibility of the confidentiality of this discharge information lies with you and/or your care-partner. 

## 2023-04-22 NOTE — Op Note (Signed)
[facility] Patient Name: Valerie [name] Procedure Date: 04/22/2023 2:28 PM MRN: [id number] Endoscopist: Viviann Spare P. Adela Lank , MD, [id number] Age: 77 Referring MD:  Date of Birth: 09-16-1946 Gender: Female Account #: [id number] Procedure:                Colonoscopy Indications:              High risk colon cancer surveillance: Personal                            history of colonic polyps - adenoma removed 02/2018,                            brother with colon cancer dx age 58s Medicines:                Monitored Anesthesia Care Procedure:                Pre-Anesthesia Assessment:                           - Prior to the procedure, a History and Physical                            was performed, and patient medications and                            allergies were reviewed. The patient's tolerance of                            previous anesthesia was also reviewed. The risks                            and benefits of the procedure and the sedation                            options and risks were discussed with the patient.                            All questions were answered, and informed consent                            was obtained. Prior Anticoagulants: The patient has                            taken no anticoagulant or antiplatelet agents. ASA                            Grade Assessment: III - A patient with severe                            systemic disease. After reviewing the risks and                            benefits, the patient was deemed in satisfactory  condition to undergo the procedure.                           After obtaining informed consent, the colonoscope                            was passed under direct vision. Throughout the                            procedure, the patient's blood pressure, pulse, and                            oxygen saturations were monitored continuously. The                            CF [id number]  #[id number] was introduced through the anus                            and advanced to the the cecum, identified by                            appendiceal orifice and ileocecal valve. The                            colonoscopy was performed without difficulty. The                            patient tolerated the procedure well. The quality                            of the bowel preparation was good. The ileocecal                            valve, appendiceal orifice, and rectum were                            photographed. Scope In: 2:32:53 PM Scope Out: 2:48:53 PM Scope Withdrawal Time: 0 hours 13 minutes 36 seconds  Total Procedure Duration: 0 hours 16 minutes 0 seconds  Findings:                 The perianal and digital rectal examinations were                            normal.                           A diminutive polyp was found in the cecum. The                            polyp was sessile. The polyp was removed with a                            cold snare. Resection and retrieval were complete.  Two flat and sessile polyps were found in the                            transverse colon. The polyps were 3 mm in size.                            These polyps were removed with a cold snare.                            Resection and retrieval were complete.                           Scattered small-mouthed diverticula were found in                            the entire colon.                           Internal hemorrhoids were found during retroflexion.                           The exam was otherwise without abnormality. Complications:            No immediate complications. Estimated blood loss:                            Minimal. Estimated Blood Loss:     Estimated blood loss was minimal. Impression:               - One diminutive polyp in the cecum, removed with a                            cold snare. Resected and retrieved.                           - Two 3  mm polyps in the transverse colon, removed                            with a cold snare. Resected and retrieved.                           - Diverticulosis in the entire examined colon.                           - Internal hemorrhoids.                           - The examination was otherwise normal.                           - The GI Genius (intelligent endoscopy module),                            computer-aided polyp detection system powered by AI  was utilized to detect colorectal polyps through                            enhanced visualization during colonoscopy. Recommendation:           - Patient has a contact number available for                            emergencies. The signs and symptoms of potential                            delayed complications were discussed with the                            patient. Return to normal activities tomorrow.                            Written discharge instructions were provided to the                            patient.                           - Resume previous diet.                           - Continue present medications.                           - Await pathology results. Viviann Spare P. Damarrion Mimbs, MD 04/22/2023 2:55:06 PM This report has been signed electronically.

## 2023-04-22 NOTE — Progress Notes (Signed)
Vss nad trans to pacu 

## 2023-04-23 ENCOUNTER — Other Ambulatory Visit: Payer: Self-pay | Admitting: Internal Medicine

## 2023-04-23 ENCOUNTER — Telehealth: Payer: Self-pay | Admitting: *Deleted

## 2023-04-23 DIAGNOSIS — E559 Vitamin D deficiency, unspecified: Secondary | ICD-10-CM

## 2023-04-23 NOTE — Telephone Encounter (Signed)
  Follow up Call-     04/22/2023    1:38 PM  Call back number  Post procedure Call Back phone  # [phone]  Permission to leave phone message Yes     Patient questions:  Do you have a fever, pain , or abdominal swelling? No. Pain Score  0 *  Have you tolerated food without any problems? Yes.    Have you been able to return to your normal activities? Yes.    Do you have any questions about your discharge instructions: Diet   No. Medications  No. Follow up visit  No.  Do you have questions or concerns about your Care? No.  Actions: * If pain score is 4 or above: No action needed, pain <4.

## 2023-04-27 ENCOUNTER — Encounter: Payer: Self-pay | Admitting: Gastroenterology

## 2023-04-29 ENCOUNTER — Telehealth: Payer: Self-pay | Admitting: Internal Medicine

## 2023-04-29 ENCOUNTER — Ambulatory Visit (INDEPENDENT_AMBULATORY_CARE_PROVIDER_SITE_OTHER): Payer: PPO | Admitting: *Deleted

## 2023-04-29 ENCOUNTER — Other Ambulatory Visit: Payer: Self-pay | Admitting: Internal Medicine

## 2023-04-29 DIAGNOSIS — E538 Deficiency of other specified B group vitamins: Secondary | ICD-10-CM | POA: Diagnosis not present

## 2023-04-29 DIAGNOSIS — E785 Hyperlipidemia, unspecified: Secondary | ICD-10-CM

## 2023-04-29 MED ORDER — CYANOCOBALAMIN 1000 MCG/ML IJ SOLN
1000.0000 ug | Freq: Once | INTRAMUSCULAR | Status: AC
Start: 2023-04-29 — End: 2023-04-29
  Administered 2023-04-29: 1000 ug via INTRAMUSCULAR

## 2023-04-29 MED ORDER — ATORVASTATIN CALCIUM 20 MG PO TABS
20.0000 mg | ORAL_TABLET | Freq: Every day | ORAL | 1 refills | Status: DC
Start: 2023-04-29 — End: 2023-08-16

## 2023-04-29 NOTE — Telephone Encounter (Signed)
Patient states she needs a refill on atorvastatin (LIPITOR) 20 MG tablet.

## 2023-04-29 NOTE — Progress Notes (Signed)
Pls cosign for B12 inj../lmb  

## 2023-05-11 ENCOUNTER — Encounter: Payer: Self-pay | Admitting: Internal Medicine

## 2023-05-11 ENCOUNTER — Ambulatory Visit (INDEPENDENT_AMBULATORY_CARE_PROVIDER_SITE_OTHER): Payer: PPO | Admitting: Internal Medicine

## 2023-05-11 VITALS — BP 136/76 | HR 76 | Temp 97.8°F | Resp 16 | Ht 66.0 in | Wt 223.0 lb

## 2023-05-11 DIAGNOSIS — E785 Hyperlipidemia, unspecified: Secondary | ICD-10-CM

## 2023-05-11 DIAGNOSIS — Z7985 Long-term (current) use of injectable non-insulin antidiabetic drugs: Secondary | ICD-10-CM

## 2023-05-11 DIAGNOSIS — Z0001 Encounter for general adult medical examination with abnormal findings: Secondary | ICD-10-CM

## 2023-05-11 DIAGNOSIS — K2100 Gastro-esophageal reflux disease with esophagitis, without bleeding: Secondary | ICD-10-CM

## 2023-05-11 DIAGNOSIS — E876 Hypokalemia: Secondary | ICD-10-CM | POA: Diagnosis not present

## 2023-05-11 DIAGNOSIS — Z0000 Encounter for general adult medical examination without abnormal findings: Secondary | ICD-10-CM

## 2023-05-11 DIAGNOSIS — I10 Essential (primary) hypertension: Secondary | ICD-10-CM | POA: Diagnosis not present

## 2023-05-11 DIAGNOSIS — D508 Other iron deficiency anemias: Secondary | ICD-10-CM

## 2023-05-11 DIAGNOSIS — E118 Type 2 diabetes mellitus with unspecified complications: Secondary | ICD-10-CM | POA: Diagnosis not present

## 2023-05-11 LAB — HEMOGLOBIN A1C: Hgb A1c MFr Bld: 6.3 % (ref 4.6–6.5)

## 2023-05-11 LAB — CBC WITH DIFFERENTIAL/PLATELET
Basophils Absolute: 0.1 10*3/uL (ref 0.0–0.1)
Basophils Relative: 0.8 % (ref 0.0–3.0)
Eosinophils Absolute: 0.4 10*3/uL (ref 0.0–0.7)
Eosinophils Relative: 3.9 % (ref 0.0–5.0)
HCT: 34.3 % — ABNORMAL LOW (ref 36.0–46.0)
Hemoglobin: 11 g/dL — ABNORMAL LOW (ref 12.0–15.0)
Lymphocytes Relative: 25.2 % (ref 12.0–46.0)
Lymphs Abs: 2.3 10*3/uL (ref 0.7–4.0)
MCHC: 32.3 g/dL (ref 30.0–36.0)
MCV: 89.9 fl (ref 78.0–100.0)
Monocytes Absolute: 0.9 10*3/uL (ref 0.1–1.0)
Monocytes Relative: 9.8 % (ref 3.0–12.0)
Neutro Abs: 5.6 10*3/uL (ref 1.4–7.7)
Neutrophils Relative %: 60.3 % (ref 43.0–77.0)
Platelets: 372 10*3/uL (ref 150.0–400.0)
RBC: 3.81 Mil/uL — ABNORMAL LOW (ref 3.87–5.11)
RDW: 15.1 % (ref 11.5–15.5)
WBC: 9.3 10*3/uL (ref 4.0–10.5)

## 2023-05-11 LAB — IBC + FERRITIN
Ferritin: 168.2 ng/mL (ref 10.0–291.0)
Iron: 68 ug/dL (ref 42–145)
Saturation Ratios: 20.1 % (ref 20.0–50.0)
TIBC: 338.8 ug/dL (ref 250.0–450.0)
Transferrin: 242 mg/dL (ref 212.0–360.0)

## 2023-05-11 LAB — BASIC METABOLIC PANEL
BUN: 17 mg/dL (ref 6–23)
CO2: 28 mEq/L (ref 19–32)
Calcium: 9.8 mg/dL (ref 8.4–10.5)
Chloride: 96 mEq/L (ref 96–112)
Creatinine, Ser: 1.07 mg/dL (ref 0.40–1.20)
GFR: 50.93 mL/min — ABNORMAL LOW (ref >60.00)
Glucose, Bld: 114 mg/dL — ABNORMAL HIGH (ref 70–99)
Potassium: 4 mEq/L (ref 3.5–5.1)
Sodium: 135 mEq/L (ref 135–145)

## 2023-05-11 LAB — LDL CHOLESTEROL, DIRECT: Direct LDL: 60 mg/dL

## 2023-05-11 LAB — LIPID PANEL
Cholesterol: 152 mg/dL (ref 0–200)
HDL: 48.9 mg/dL (ref >39.00)
NonHDL: 103.15
Total CHOL/HDL Ratio: 3
Triglycerides: 253 mg/dL — ABNORMAL HIGH (ref 0.0–149.0)
VLDL: 50.6 mg/dL — ABNORMAL HIGH (ref 0.0–40.0)

## 2023-05-11 NOTE — Patient Instructions (Signed)

## 2023-05-11 NOTE — Progress Notes (Signed)
Subjective:  Patient ID: Valerie [name], female    DOB: February 12, 1946  Age: 77 y.o. MRN: [id number]  CC: Annual Exam, Hypertension, Diabetes, and Hyperlipidemia   HPI Valerie [name] presents for a CPX and f/up ----   She complains of feeling overwhelmed and having dizzy spells but she is active and denies chest pain, shortness of breath, diaphoresis, or edema.  Outpatient Medications Prior to Visit  Medication Sig Dispense Refill   albuterol (PROVENTIL) (2.5 MG/3ML) 0.083% nebulizer solution Take 3 mLs (2.5 mg total) by nebulization every 6 (six) hours as needed for wheezing or shortness of breath. 75 mL 3   albuterol (VENTOLIN HFA) 108 (90 Base) MCG/ACT inhaler Inhale 2 puffs into the lungs every 6 (six) hours as needed. For shortness of breath 3 each 4   aspirin 81 MG tablet Take 81 mg by mouth daily.      atorvastatin (LIPITOR) 20 MG tablet Take 1 tablet (20 mg total) by mouth daily. 90 tablet 1   Blood Glucose Calibration (ONETOUCH VERIO) LIQD 1 Act by In Vitro route daily. 1 each 5   Blood Glucose Monitoring Suppl (ONETOUCH VERIO FLEX SYSTEM) DEVI 1 Act by Does not apply route 2 (two) times daily. 1 each 3   budesonide-formoterol (SYMBICORT) 80-4.5 MCG/ACT inhaler Inhale 2 puffs into the lungs 2 (two) times daily as needed. 3 each 1   carvedilol (COREG) 25 MG tablet TAKE 1 TABLET BY MOUTH TWICE A DAY WITH FOOD 180 tablet 0   chlorthalidone (HYGROTON) 25 MG tablet TAKE 1 TABLET (25 MG TOTAL) BY MOUTH DAILY. 90 tablet 0   Cholecalciferol (VITAMIN D3) 50 MCG (2000 UT) capsule TAKE 1 CAPSULE BY MOUTH EVERY DAY 90 capsule 1   clobetasol ointment (TEMOVATE) 0.05 % Apply 1 application topically 2 (two) times daily. 60 g 1   Continuous Blood Gluc Receiver (FREESTYLE LIBRE 2 READER) DEVI 1 Act by Does not apply route daily. 2 each 5   Continuous Blood Gluc Sensor (FREESTYLE LIBRE 2 SENSOR) MISC 1 Act by Does not apply route daily. 2 each 5   Dulaglutide (TRULICITY) 1.5 MG/0.5ML SOPN Inject 1.5  mg into the skin once a week. 6 mL 0   Empagliflozin-metFORMIN HCl (SYNJARDY) 12.5-500 MG TABS Take 1 tablet by mouth 2 (two) times daily. 180 tablet 1   famotidine (PEPCID) 40 MG tablet TAKE 1 TABLET BY MOUTH EVERY DAY 90 tablet 1   ferrous sulfate 325 (65 FE) MG tablet TAKE 1 TABLET BY MOUTH 3 TIMES DAILY WITH MEALS. 270 tablet 0   Finerenone (KERENDIA) 20 MG TABS Take 1 tablet by mouth daily. Via Patient [employer] Health and safety inspector) 90 tablet 3   gabapentin (NEURONTIN) 300 MG capsule TAKE 1 CAPSULE BY MOUTH THREE TIMES A DAY 270 capsule 1   glucose blood (FREESTYLE TEST STRIPS) test strip USE TO TEST BLOOD SUGAR 3 TIMES A DAY. DX: E11.8 300 strip 1   glucose blood (ONETOUCH VERIO) test strip 1 each by Other route 2 (two) times daily. Use as instructed 200 strip 1   irbesartan (AVAPRO) 300 MG tablet Take 0.5 tablets (150 mg total) by mouth daily. 45 tablet 1   latanoprost (XALATAN) 0.005 % ophthalmic solution Place 1 drop into both eyes at bedtime.      oxyCODONE-acetaminophen (PERCOCET/ROXICET) 5-325 MG tablet Take 1 tablet by mouth every 8 (eight) hours as needed for severe pain. 90 tablet 0   pantoprazole (PROTONIX) 40 MG tablet TAKE 1 TABLET BY MOUTH 2 TIMES DAILY BEFORE  A MEAL 180 tablet 0   potassium chloride SA (KLOR-CON M20) 20 MEQ tablet TAKE 1 TABLET BY MOUTH TWICE A DAY 180 tablet 1   No facility-administered medications prior to visit.    ROS Review of Systems  Constitutional: Negative.  Negative for chills, diaphoresis, fatigue and fever.  HENT: Negative.    Eyes: Negative.   Respiratory:  Positive for apnea. Negative for cough, chest tightness, shortness of breath and wheezing.   Cardiovascular:  Negative for chest pain, palpitations and leg swelling.  Gastrointestinal: Negative.  Negative for abdominal pain, constipation, diarrhea and vomiting.  Endocrine: Negative.   Genitourinary: Negative.  Negative for difficulty urinating and dysuria.  Musculoskeletal:  Positive for arthralgias  and back pain. Negative for myalgias.  Skin: Negative.   Neurological:  Negative for dizziness, weakness and light-headedness.  Hematological:  Negative for adenopathy. Does not bruise/bleed easily.  Psychiatric/Behavioral: Negative.      Objective:  BP 136/76 (BP Location: Left Arm, Patient Position: Sitting, Cuff Size: Large)   Pulse 76   Temp 97.8 F (36.6 C) (Oral)   Resp 16   Ht 5\' 6"  (1.676 m)   Wt 223 lb (101.2 kg)   SpO2 99%   BMI 35.99 kg/m   BP Readings from Last 3 Encounters:  05/11/23 136/76  04/22/23 118/68  04/01/23 114/62    Wt Readings from Last 3 Encounters:  05/11/23 223 lb (101.2 kg)  04/22/23 224 lb (101.6 kg)  04/01/23 224 lb 6.4 oz (101.8 kg)    Physical Exam Vitals reviewed.  Constitutional:      Appearance: Normal appearance. She is not ill-appearing.  HENT:     Mouth/Throat:     Mouth: Mucous membranes are moist.  Eyes:     General: No scleral icterus.    Conjunctiva/sclera: Conjunctivae normal.  Cardiovascular:     Rate and Rhythm: Normal rate and regular rhythm.     Heart sounds: No murmur heard. Pulmonary:     Effort: Pulmonary effort is normal.     Breath sounds: No stridor. No wheezing, rhonchi or rales.  Abdominal:     General: Abdomen is flat.     Palpations: There is no mass.     Tenderness: There is no abdominal tenderness. There is no guarding.     Hernia: No hernia is present.  Musculoskeletal:        General: Normal range of motion.     Cervical back: Neck supple.     Right lower leg: No edema.     Left lower leg: No edema.  Lymphadenopathy:     Cervical: No cervical adenopathy.  Skin:    General: Skin is warm and dry.  Neurological:     General: No focal deficit present.     Mental Status: She is alert. Mental status is at baseline.  Psychiatric:        Mood and Affect: Mood normal.        Behavior: Behavior normal.     Lab Results  Component Value Date   WBC 9.3 05/11/2023   HGB 11.0 (L) 05/11/2023   HCT  34.3 (L) 05/11/2023   PLT 372.0 05/11/2023   GLUCOSE 114 (H) 05/11/2023   CHOL 152 05/11/2023   TRIG 253.0 (H) 05/11/2023   HDL 48.90 05/11/2023   LDLDIRECT 60.0 05/11/2023   LDLCALC 56 05/05/2022   ALT 15 02/09/2023   AST 13 02/09/2023   NA 135 05/11/2023   K 4.0 05/11/2023   CL 96 05/11/2023  CREATININE 1.07 05/11/2023   BUN 17 05/11/2023   CO2 28 05/11/2023   TSH 3.00 02/09/2023   INR 1.1 (H) 04/10/2021   HGBA1C 6.3 05/11/2023   MICROALBUR <0.7 09/08/2022    No results found.  Assessment & Plan:   Hyperlipidemia with target LDL less than 100- LDL goal achieved. Doing well on the statin  -     Lipid panel; Future  Essential hypertension, benign- Her blood pressure is well-controlled. -     Basic metabolic panel; Future  Hypokalemia -     Basic metabolic panel; Future  Type II diabetes mellitus with manifestations (HCC)- Her blood sugar is well-controlled. -     Basic metabolic panel; Future -     Hemoglobin A1c; Future  Gastroesophageal reflux disease with esophagitis without hemorrhage -     CBC with Differential/Platelet; Future  Iron deficiency anemia secondary to inadequate dietary iron intake- Her H&H are stable.  Iron level is normal. -     IBC + Ferritin; Future -     CBC with Differential/Platelet; Future  Encounter for general adult medical examination with abnormal findings- Exam completed, labs reviewed, vaccines are up-to-date, no cancer screenings indicated, patient education was given.  Other orders -     LDL cholesterol, direct     Follow-up: Return in about 6 months (around 11/11/2023).  Sanda Linger, MD

## 2023-05-16 ENCOUNTER — Encounter: Payer: Self-pay | Admitting: Internal Medicine

## 2023-05-24 ENCOUNTER — Other Ambulatory Visit: Payer: Self-pay | Admitting: Internal Medicine

## 2023-05-24 DIAGNOSIS — I10 Essential (primary) hypertension: Secondary | ICD-10-CM

## 2023-05-31 ENCOUNTER — Ambulatory Visit (INDEPENDENT_AMBULATORY_CARE_PROVIDER_SITE_OTHER): Payer: PPO

## 2023-05-31 DIAGNOSIS — E538 Deficiency of other specified B group vitamins: Secondary | ICD-10-CM

## 2023-05-31 MED ORDER — CYANOCOBALAMIN 1000 MCG/ML IJ SOLN
1000.0000 ug | Freq: Once | INTRAMUSCULAR | Status: AC
Start: 2023-05-31 — End: 2023-05-31
  Administered 2023-05-31: 1000 ug via INTRAMUSCULAR

## 2023-05-31 NOTE — Progress Notes (Signed)
After obtaining consent, and per orders of Dr. Jones, injection of B12 given by Yared Barefoot P Querida Beretta. Patient instructed to report any adverse reaction to me immediately.  

## 2023-06-24 ENCOUNTER — Other Ambulatory Visit: Payer: Self-pay | Admitting: Internal Medicine

## 2023-06-24 DIAGNOSIS — I10 Essential (primary) hypertension: Secondary | ICD-10-CM

## 2023-06-27 ENCOUNTER — Other Ambulatory Visit: Payer: Self-pay | Admitting: Internal Medicine

## 2023-06-27 DIAGNOSIS — K2100 Gastro-esophageal reflux disease with esophagitis, without bleeding: Secondary | ICD-10-CM

## 2023-07-01 ENCOUNTER — Ambulatory Visit (INDEPENDENT_AMBULATORY_CARE_PROVIDER_SITE_OTHER): Payer: PPO

## 2023-07-01 DIAGNOSIS — E538 Deficiency of other specified B group vitamins: Secondary | ICD-10-CM

## 2023-07-01 MED ORDER — CYANOCOBALAMIN 1000 MCG/ML IJ SOLN
1000.0000 ug | Freq: Once | INTRAMUSCULAR | Status: AC
Start: 2023-07-01 — End: 2023-07-01
  Administered 2023-07-01: 1000 ug via INTRAMUSCULAR

## 2023-07-01 NOTE — Progress Notes (Signed)
Patient here for monthly B12 injection per Dr. Jones.  B12 1000 mcg given in left IM and patient tolerated injection well today.  

## 2023-07-02 DIAGNOSIS — G4733 Obstructive sleep apnea (adult) (pediatric): Secondary | ICD-10-CM | POA: Diagnosis not present

## 2023-07-02 DIAGNOSIS — G479 Sleep disorder, unspecified: Secondary | ICD-10-CM | POA: Diagnosis not present

## 2023-07-15 ENCOUNTER — Other Ambulatory Visit: Payer: Self-pay | Admitting: Internal Medicine

## 2023-07-15 DEATH — deceased

## 2023-08-02 ENCOUNTER — Ambulatory Visit: Payer: PPO

## 2023-08-05 ENCOUNTER — Ambulatory Visit (INDEPENDENT_AMBULATORY_CARE_PROVIDER_SITE_OTHER): Payer: PPO | Admitting: *Deleted

## 2023-08-05 DIAGNOSIS — E538 Deficiency of other specified B group vitamins: Secondary | ICD-10-CM

## 2023-08-05 MED ORDER — CYANOCOBALAMIN 1000 MCG/ML IJ SOLN
1000.0000 ug | Freq: Once | INTRAMUSCULAR | Status: AC
Start: 2023-08-05 — End: 2023-08-05
  Administered 2023-08-05: 1000 ug via INTRAMUSCULAR

## 2023-08-05 NOTE — Progress Notes (Signed)
Pls cosign for B12 inj../lmb  

## 2023-08-09 ENCOUNTER — Ambulatory Visit: Payer: PPO

## 2023-08-11 ENCOUNTER — Other Ambulatory Visit: Payer: Self-pay | Admitting: Physician Assistant

## 2023-08-11 ENCOUNTER — Ambulatory Visit: Payer: PPO | Admitting: Internal Medicine

## 2023-08-12 ENCOUNTER — Telehealth: Payer: Self-pay | Admitting: Internal Medicine

## 2023-08-12 NOTE — Telephone Encounter (Signed)
Prescription Request  08/12/2023  LOV: 05/11/2023  What is the name of the medication or equipment? Klor-con  Have you contacted your pharmacy to request a refill? Yes   Which pharmacy would you like this sent to?  [id number] [place], [place] - [address] [place] [address] Phone: [phone] Fax: [phone]  Patient notified that their request is being sent to the clinical staff for review and that they should receive a response within 2 business days.   Please advise at Mobile [phone] (mobile)

## 2023-08-13 ENCOUNTER — Other Ambulatory Visit: Payer: Self-pay | Admitting: Internal Medicine

## 2023-08-13 DIAGNOSIS — E118 Type 2 diabetes mellitus with unspecified complications: Secondary | ICD-10-CM

## 2023-08-13 IMAGING — CT CT HEART MORP W/ CTA COR W/ SCORE W/ CA W/CM &/OR W/O CM
1 of 2 series · 8 of 20 positions shown, 10 images · non-contrast
Comparison: CT a chest 08/29/2015

Addendum:
CLINICAL DATA: 76F with chest pain

EXAM:
Cardiac/Coronary CTA
TECHNIQUE: The patient was scanned on a [employer] Force scanner.

[Series 3693: coronaries · 8 of 10 slices shown, 10 images]
[im 2/10  vessel]
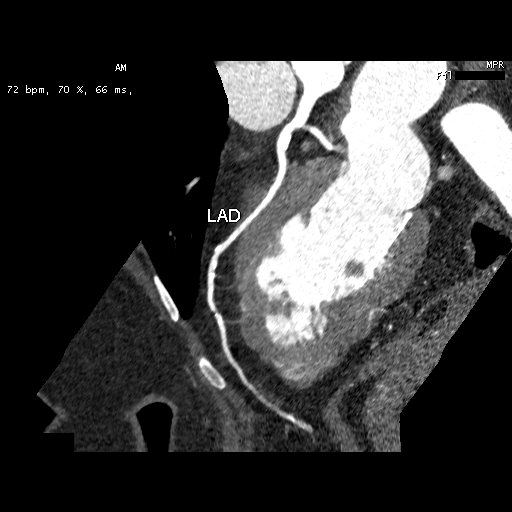
[im 2/10  lung]
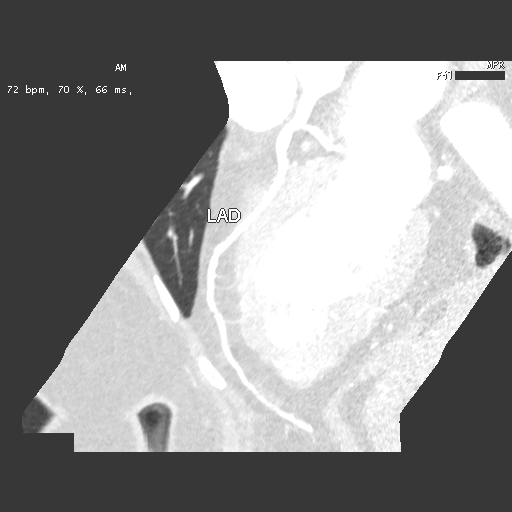
[im 3/10  vessel]
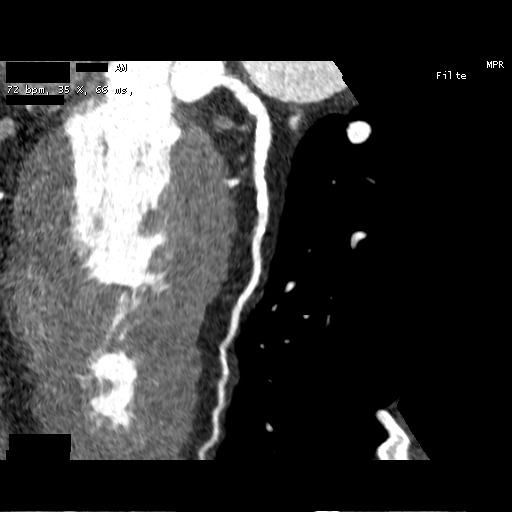
[im 4/10  vessel]
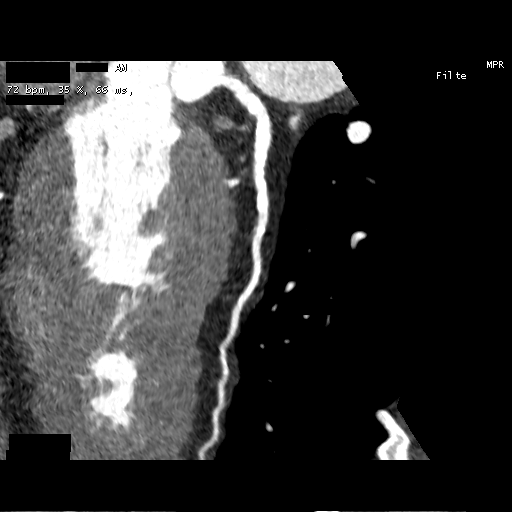
[im 5/10  vessel]
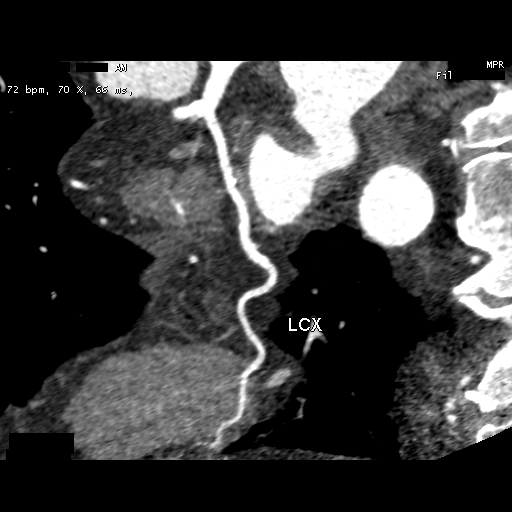
[im 6/10  vessel]
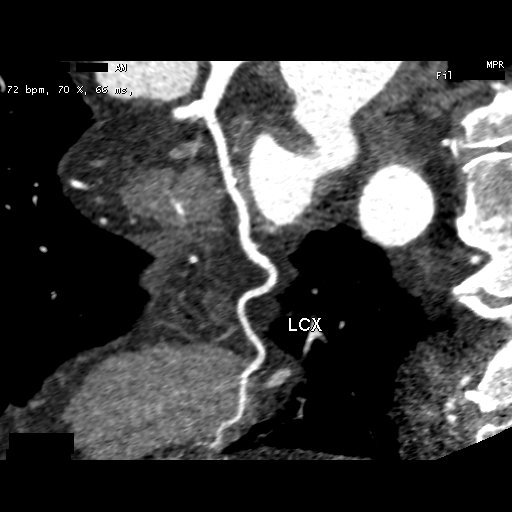
[im 6/10  lung]
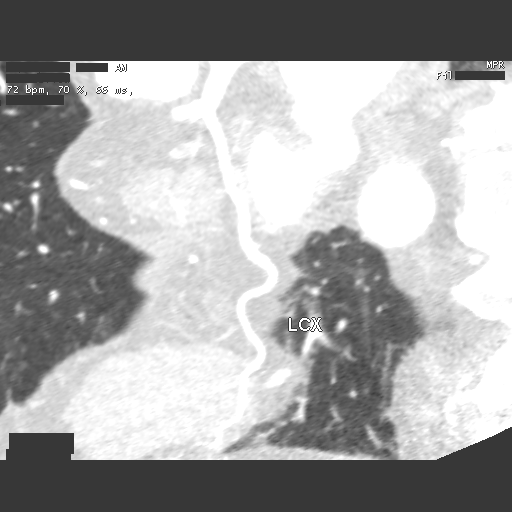
[im 7/10  vessel]
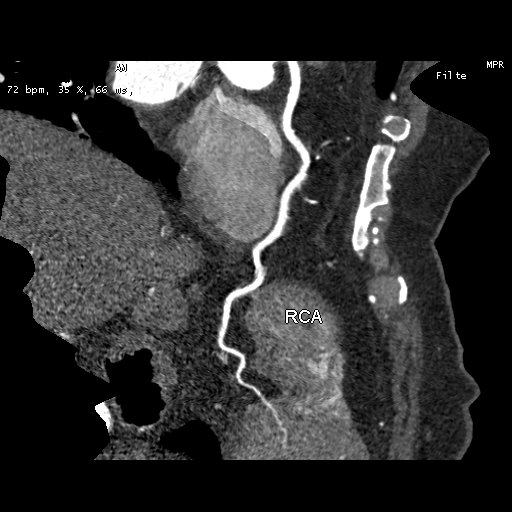
[im 8/10  vessel]
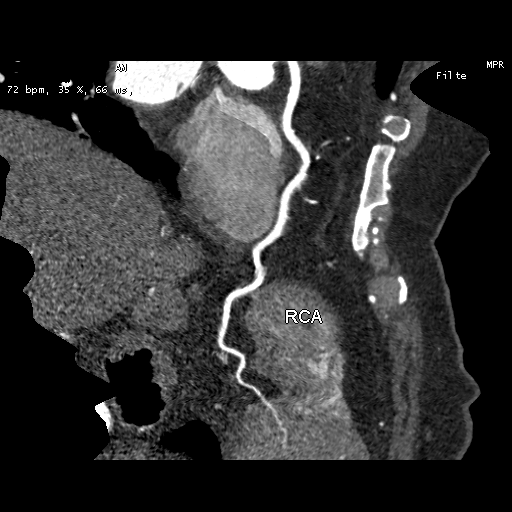
[im 9/10  vessel]
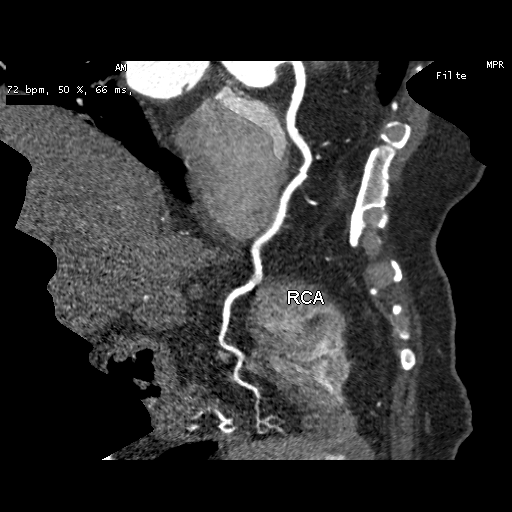

[8 of 20 positions shown; findings below may reference images not displayed]



Coronary Arteries:  Normal coronary origin.  Right dominance.

RCA is a large dominant artery that gives rise to PDA and PLA.
Calcified plaque in the proximal RCA causes 0-24% stenosis.
Calcified plaque in the mid RCA causes 25-49% stenosis.

Left main is a large artery that gives rise to LAD and LCX arteries.

LAD is a large vessel. Calcified plaque in the proximal LAD causes
0-24% stenosis. Calcified plaque in the mid LAD causes 25-49%
stenosis. Calcified plaque in the distal LAD causes 25-49% stenosis

LCX is a non-dominant artery that gives rise to one large OM1
branch. Calcified plaque in the proximal LCX causes 0-24% stenosis.
Calcified plaque in the mid LCX causes 0-24% stenosis

Calcified plaque in ramus causes 25-49% stenosis

Other findings:

Left Ventricle: Normal size

Left Atrium: Normal size

Pulmonary Veins: Normal configuration

Right Ventricle: Normal size

Right Atrium: Normal size

Cardiac valves: No calcifications

Thoracic aorta: Normal size

Pulmonary Arteries: Normal size

Systemic Veins: Normal drainage

Pericardium: Normal thickness
IMPRESSION: 1. Coronary calcium score of 454. This was 90th percentile for age
and sex matched control.

2.  Normal coronary origin with right dominance.

3.  Nonobstructive CAD

4.  Proximal to mid LAD myocardial bridge
5.  PFO

CAD-RADS 2. Mild non-obstructive CAD (25-49%). Consider
non-atherosclerotic causes of chest pain. Consider preventive
therapy and risk factor modification.

EXAM:
OVER-READ INTERPRETATION  PET-CT CHEST

The following report is an over-read performed by radiologist Dr.
Oratile E Hofisi [[email or web address]] on 04/06/2022. This over-read
does not include interpretation of cardiac or coronary anatomy or
pathology. The cardiac CT interpretation by the cardiologist is to
be attached.
FINDINGS: The visualized lung parenchyma shows no suspicious pulmonary nodule
or mass. Calcified granuloma noted left lower lobe. No focal
airspace consolidation. No effusion.

No evidence for lymphadenopathy within the visualized mediastinum or
hilar regions. Tiny hiatal hernia noted.

No suspicious lytic or sclerotic osseous abnormality.
IMPRESSION: No acute or clinically significant extracardiac findings.

*** End of Addendum ***
FINDINGS: A 100 kV prospective scan was triggered in the descending thoracic
aorta at 111 HU's. Axial non-contrast 3 mm slices were carried out
through the heart. The data set was analyzed on a dedicated work
station and scored using the Agatson method. Gantry rotation speed
was 250 msecs and collimation was .6 mm. No beta blockade and 0.8 mg
of sl NTG was given. The 3D data set was reconstructed in 5%
intervals of the 67-82 % of the R-R cycle. Diastolic phases were
analyzed on a dedicated work station using MPR, MIP and VRT modes.
The patient received 80 cc of contrast.

Coronary Arteries:  Normal coronary origin.  Right dominance.

RCA is a large dominant artery that gives rise to PDA and PLA.
Calcified plaque in the proximal RCA causes 0-24% stenosis.
Calcified plaque in the mid RCA causes 25-49% stenosis.

Left main is a large artery that gives rise to LAD and LCX arteries.

LAD is a large vessel. Calcified plaque in the proximal LAD causes
0-24% stenosis. Calcified plaque in the mid LAD causes 25-49%
stenosis. Calcified plaque in the distal LAD causes 25-49% stenosis

LCX is a non-dominant artery that gives rise to one large OM1
branch. Calcified plaque in the proximal LCX causes 0-24% stenosis.
Calcified plaque in the mid LCX causes 0-24% stenosis

Calcified plaque in ramus causes 25-49% stenosis

Other findings:

Left Ventricle: Normal size

Left Atrium: Normal size

Pulmonary Veins: Normal configuration

Right Ventricle: Normal size

Right Atrium: Normal size

Cardiac valves: No calcifications

Thoracic aorta: Normal size

Pulmonary Arteries: Normal size

Systemic Veins: Normal drainage

Pericardium: Normal thickness
IMPRESSION: 1. Coronary calcium score of 454. This was 90th percentile for age
and sex matched control.

2.  Normal coronary origin with right dominance.

3.  Nonobstructive CAD

4.  Proximal to mid LAD myocardial bridge
5.  PFO

CAD-RADS 2. Mild non-obstructive CAD (25-49%). Consider
non-atherosclerotic causes of chest pain. Consider preventive
therapy and risk factor modification.

## 2023-08-15 ENCOUNTER — Other Ambulatory Visit: Payer: Self-pay | Admitting: Internal Medicine

## 2023-08-15 DIAGNOSIS — E785 Hyperlipidemia, unspecified: Secondary | ICD-10-CM

## 2023-08-18 ENCOUNTER — Encounter: Payer: Self-pay | Admitting: Internal Medicine

## 2023-08-18 ENCOUNTER — Ambulatory Visit (INDEPENDENT_AMBULATORY_CARE_PROVIDER_SITE_OTHER): Payer: PPO | Admitting: Internal Medicine

## 2023-08-18 VITALS — BP 122/72 | HR 81 | Temp 98.2°F | Ht 66.0 in | Wt 220.0 lb

## 2023-08-18 DIAGNOSIS — Z23 Encounter for immunization: Secondary | ICD-10-CM | POA: Diagnosis not present

## 2023-08-18 DIAGNOSIS — D510 Vitamin B12 deficiency anemia due to intrinsic factor deficiency: Secondary | ICD-10-CM | POA: Diagnosis not present

## 2023-08-18 DIAGNOSIS — R072 Precordial pain: Secondary | ICD-10-CM

## 2023-08-18 DIAGNOSIS — N1831 Chronic kidney disease, stage 3a: Secondary | ICD-10-CM | POA: Diagnosis not present

## 2023-08-18 DIAGNOSIS — I10 Essential (primary) hypertension: Secondary | ICD-10-CM

## 2023-08-18 DIAGNOSIS — E876 Hypokalemia: Secondary | ICD-10-CM | POA: Diagnosis not present

## 2023-08-18 DIAGNOSIS — E1122 Type 2 diabetes mellitus with diabetic chronic kidney disease: Secondary | ICD-10-CM

## 2023-08-18 DIAGNOSIS — E118 Type 2 diabetes mellitus with unspecified complications: Secondary | ICD-10-CM | POA: Diagnosis not present

## 2023-08-18 DIAGNOSIS — N1832 Chronic kidney disease, stage 3b: Secondary | ICD-10-CM | POA: Diagnosis not present

## 2023-08-18 DIAGNOSIS — D508 Other iron deficiency anemias: Secondary | ICD-10-CM | POA: Diagnosis not present

## 2023-08-18 LAB — URINALYSIS, ROUTINE W REFLEX MICROSCOPIC
Bilirubin Urine: NEGATIVE
Hgb urine dipstick: NEGATIVE
Ketones, ur: NEGATIVE
Nitrite: NEGATIVE
Specific Gravity, Urine: 1.01 (ref 1.000–1.030)
Total Protein, Urine: NEGATIVE
Urine Glucose: >=1000 — AB
Urobilinogen, UA: 0.2 (ref 0.0–1.0)
pH: 6 (ref 5.0–8.0)

## 2023-08-18 LAB — HEMOGLOBIN A1C: Hgb A1c MFr Bld: 6.3 % (ref 4.6–6.5)

## 2023-08-18 LAB — CBC WITH DIFFERENTIAL/PLATELET
Basophils Absolute: 0.1 10*3/uL (ref 0.0–0.1)
Basophils Relative: 0.5 % (ref 0.0–3.0)
Eosinophils Absolute: 0.2 10*3/uL (ref 0.0–0.7)
Eosinophils Relative: 2.2 % (ref 0.0–5.0)
HCT: 36.3 % (ref 36.0–46.0)
Hemoglobin: 11.6 g/dL — ABNORMAL LOW (ref 12.0–15.0)
Lymphocytes Relative: 30.1 % (ref 12.0–46.0)
Lymphs Abs: 3.1 10*3/uL (ref 0.7–4.0)
MCHC: 31.9 g/dL (ref 30.0–36.0)
MCV: 91 fl (ref 78.0–100.0)
Monocytes Absolute: 0.9 10*3/uL (ref 0.1–1.0)
Monocytes Relative: 8.8 % (ref 3.0–12.0)
Neutro Abs: 6 10*3/uL (ref 1.4–7.7)
Neutrophils Relative %: 58.4 % (ref 43.0–77.0)
Platelets: 348 10*3/uL (ref 150.0–400.0)
RBC: 3.99 Mil/uL (ref 3.87–5.11)
RDW: 16.2 % — ABNORMAL HIGH (ref 11.5–15.5)
WBC: 10.3 10*3/uL (ref 4.0–10.5)

## 2023-08-18 LAB — BASIC METABOLIC PANEL
BUN: 18 mg/dL (ref 6–23)
CO2: 29 meq/L (ref 19–32)
Calcium: 10.5 mg/dL (ref 8.4–10.5)
Chloride: 94 meq/L — ABNORMAL LOW (ref 96–112)
Creatinine, Ser: 1.51 mg/dL — ABNORMAL HIGH (ref 0.40–1.20)
GFR: 33.62 mL/min — ABNORMAL LOW (ref >60.00)
Glucose, Bld: 130 mg/dL — ABNORMAL HIGH (ref 70–99)
Potassium: 3.4 meq/L — ABNORMAL LOW (ref 3.5–5.1)
Sodium: 136 meq/L (ref 135–145)

## 2023-08-18 LAB — MICROALBUMIN / CREATININE URINE RATIO
Creatinine,U: 146.5 mg/dL
Microalb Creat Ratio: 0.6 mg/g (ref 0.0–30.0)
Microalb, Ur: 0.9 mg/dL (ref 0.0–1.9)

## 2023-08-18 LAB — IBC + FERRITIN
Ferritin: 240 ng/mL (ref 10.0–291.0)
Iron: 66 ug/dL (ref 42–145)
Saturation Ratios: 18.6 % — ABNORMAL LOW (ref 20.0–50.0)
TIBC: 354.2 ug/dL (ref 250.0–450.0)
Transferrin: 253 mg/dL (ref 212.0–360.0)

## 2023-08-18 LAB — TROPONIN I (HIGH SENSITIVITY): High Sens Troponin I: 5 ng/L (ref 2–17)

## 2023-08-18 LAB — FOLATE: Folate: 11.9 ng/mL (ref >5.9)

## 2023-08-18 NOTE — Progress Notes (Unsigned)
Subjective:  Patient ID: Valerie [name], female    DOB: 12/09/1946  Age: 77 y.o. MRN: [id number]  CC: Anemia and Diabetes   HPI SHANDREKA DIPINTO presents for f/up ---  Discussed the use of AI scribe software for clinical note transcription with the patient, who gave verbal consent to proceed.  History of Present Illness   The patient presents with a chief complaint of chest pain persisting for over a week. The pain is described as a dull ache, intermittent in nature. Accompanying symptoms include diaphoresis, nausea, and occasional shortness of breath. The patient has experienced similar chest pain in the past. There is no reported dysphagia or odynophagia.       Outpatient Medications Prior to Visit  Medication Sig Dispense Refill   albuterol (PROVENTIL) (2.5 MG/3ML) 0.083% nebulizer solution Take 3 mLs (2.5 mg total) by nebulization every 6 (six) hours as needed for wheezing or shortness of breath. 75 mL 3   albuterol (VENTOLIN HFA) 108 (90 Base) MCG/ACT inhaler Inhale 2 puffs into the lungs every 6 (six) hours as needed. For shortness of breath 3 each 4   aspirin 81 MG tablet Take 81 mg by mouth daily.      atorvastatin (LIPITOR) 20 MG tablet TAKE 1 TABLET BY MOUTH EVERY DAY 90 tablet 0   Blood Glucose Calibration (ONETOUCH VERIO) LIQD 1 Act by In Vitro route daily. 1 each 5   Blood Glucose Monitoring Suppl (ONETOUCH VERIO FLEX SYSTEM) DEVI 1 Act by Does not apply route 2 (two) times daily. 1 each 3   budesonide-formoterol (SYMBICORT) 80-4.5 MCG/ACT inhaler Inhale 2 puffs into the lungs 2 (two) times daily as needed. 3 each 1   carvedilol (COREG) 25 MG tablet TAKE 1 TABLET BY MOUTH TWICE A DAY WITH FOOD 180 tablet 0   Cholecalciferol (VITAMIN D3) 50 MCG (2000 UT) capsule TAKE 1 CAPSULE BY MOUTH EVERY DAY 90 capsule 1   clobetasol ointment (TEMOVATE) 0.05 % Apply 1 application topically 2 (two) times daily. 60 g 1   Continuous Blood Gluc Receiver (FREESTYLE LIBRE 2 READER) DEVI 1 Act  by Does not apply route daily. 2 each 5   Continuous Blood Gluc Sensor (FREESTYLE LIBRE 2 SENSOR) MISC 1 Act by Does not apply route daily. 2 each 5   Dulaglutide (TRULICITY) 1.5 MG/0.5ML SOPN Inject 1.5 mg into the skin once a week. 6 mL 0   famotidine (PEPCID) 40 MG tablet TAKE 1 TABLET BY MOUTH EVERY DAY 90 tablet 1   ferrous sulfate 325 (65 FE) MG tablet TAKE 1 TABLET BY MOUTH 3 TIMES DAILY WITH MEALS. 270 tablet 0   gabapentin (NEURONTIN) 300 MG capsule TAKE 1 CAPSULE BY MOUTH THREE TIMES A DAY 270 capsule 1   glucose blood (FREESTYLE TEST STRIPS) test strip USE TO TEST BLOOD SUGAR 3 TIMES A DAY. DX: E11.8 300 strip 1   irbesartan (AVAPRO) 300 MG tablet Take 0.5 tablets (150 mg total) by mouth daily. 45 tablet 1   latanoprost (XALATAN) 0.005 % ophthalmic solution Place 1 drop into both eyes at bedtime.      ONETOUCH VERIO test strip 1 EACH BY OTHER ROUTE 2 (TWO) TIMES DAILY. USE AS INSTRUCTED 200 strip 1   oxyCODONE-acetaminophen (PERCOCET/ROXICET) 5-325 MG tablet Take 1 tablet by mouth every 8 (eight) hours as needed for severe pain. 90 tablet 0   pantoprazole (PROTONIX) 40 MG tablet TAKE 1 TABLET BY MOUTH 2 TIMES DAILY BEFORE A MEAL 180 tablet 0   SYNJARDY  12.5-500 MG TABS TAKE 1 TABLET BY MOUTH 2 (TWO) TIMES DAILY. TAKE 2 TABLETS DAILY. 180 tablet 1   chlorthalidone (HYGROTON) 25 MG tablet TAKE 1 TABLET (25 MG TOTAL) BY MOUTH DAILY. 90 tablet 0   Finerenone (KERENDIA) 20 MG TABS Take 1 tablet by mouth daily. Via Patient Assistance Health and safety inspector) 90 tablet 3   potassium chloride SA (KLOR-CON M20) 20 MEQ tablet TAKE 1 TABLET BY MOUTH TWICE A DAY 180 tablet 1   No facility-administered medications prior to visit.    ROS Review of Systems  Constitutional:  Negative for chills, diaphoresis, fatigue and fever.  HENT: Negative.    Eyes: Negative.   Respiratory:  Negative for chest tightness, shortness of breath and wheezing.   Cardiovascular:  Positive for chest pain. Negative for palpitations  and leg swelling.  Gastrointestinal:  Negative for abdominal pain, constipation, diarrhea, nausea and vomiting.  Genitourinary: Negative.  Negative for difficulty urinating and dysuria.  Musculoskeletal:  Positive for arthralgias, back pain and gait problem. Negative for myalgias.  Skin: Negative.   Neurological:  Negative for dizziness, weakness and light-headedness.  Hematological:  Negative for adenopathy. Does not bruise/bleed easily.  Psychiatric/Behavioral: Negative.      Objective:  BP 122/72 (BP Location: Right Arm, Patient Position: Sitting, Cuff Size: Large)   Pulse 81   Temp 98.2 F (36.8 C) (Oral)   Ht 5\' 6"  (1.676 m)   Wt 220 lb (99.8 kg)   SpO2 99%   BMI 35.51 kg/m   BP Readings from Last 3 Encounters:  08/18/23 122/72  05/11/23 136/76  04/22/23 118/68    Wt Readings from Last 3 Encounters:  08/18/23 220 lb (99.8 kg)  05/11/23 223 lb (101.2 kg)  04/22/23 224 lb (101.6 kg)    Physical Exam Vitals reviewed.  HENT:     Nose: Nose normal.     Mouth/Throat:     Mouth: Mucous membranes are moist.  Eyes:     General: No scleral icterus.    Conjunctiva/sclera: Conjunctivae normal.  Cardiovascular:     Rate and Rhythm: Normal rate and regular rhythm.     Heart sounds: No murmur heard.    No gallop.     Comments: EKG- NSR, 84 bpm NS T wave changes Artifact [place] Q in III Improved compared to EKG from 9 months ago Pulmonary:     Effort: Pulmonary effort is normal.     Breath sounds: No stridor. No wheezing, rhonchi or rales.  Abdominal:     General: Abdomen is protuberant. Bowel sounds are normal. There is no distension.     Palpations: Abdomen is soft. There is no fluid wave, hepatomegaly, splenomegaly or mass.     Tenderness: There is no abdominal tenderness. There is no guarding.  Musculoskeletal:     Cervical back: Neck supple.     Right lower leg: No edema.     Left lower leg: No edema.  Lymphadenopathy:     Cervical: No cervical adenopathy.   Skin:    General: Skin is warm and dry.     Findings: No rash.  Neurological:     General: No focal deficit present.     Mental Status: She is alert. Mental status is at baseline.  Psychiatric:        Mood and Affect: Mood normal.        Behavior: Behavior normal.     Lab Results  Component Value Date   WBC 10.3 08/18/2023   HGB 11.6 (L) 08/18/2023  HCT 36.3 08/18/2023   PLT 348.0 08/18/2023   GLUCOSE 130 (H) 08/18/2023   CHOL 152 05/11/2023   TRIG 253.0 (H) 05/11/2023   HDL 48.90 05/11/2023   LDLDIRECT 60.0 05/11/2023   LDLCALC 56 05/05/2022   ALT 15 02/09/2023   AST 13 02/09/2023   NA 136 08/18/2023   K 3.4 (L) 08/18/2023   CL 94 (L) 08/18/2023   CREATININE 1.51 (H) 08/18/2023   BUN 18 08/18/2023   CO2 29 08/18/2023   TSH 3.00 02/09/2023   INR 1.1 (H) 04/10/2021   HGBA1C 6.3 08/18/2023   MICROALBUR 0.9 08/18/2023    No results found.  Assessment & Plan:   Vitamin B12 deficiency anemia due to intrinsic factor deficiency -     CBC with Differential/Platelet; Future -     Folate; Future  Type II diabetes mellitus with manifestations (HCC) -     Hemoglobin A1c; Future -     Basic metabolic panel; Future -     Microalbumin / creatinine urine ratio; Future -     HM Diabetes Foot Exam -     Chauncey Mann; Take 1 tablet (20 mg total) by mouth daily. Via Patient Assistance Health and safety inspector)  Dispense: 90 tablet; Refill: 1  Stage 3a chronic kidney disease (HCC) -     Basic metabolic panel; Future -     Microalbumin / creatinine urine ratio; Future -     Urinalysis, Routine w reflex microscopic; Future -     Chauncey Mann; Take 1 tablet (20 mg total) by mouth daily. Via Patient Assistance Health and safety inspector)  Dispense: 90 tablet; Refill: 1  Iron deficiency anemia secondary to inadequate dietary iron intake -     CBC with Differential/Platelet; Future -     IBC + Ferritin; Future  Flu vaccine need -     Flu Vaccine Trivalent High Dose (Fluad)  Precordial pain- EKG and troponin are  reassuring.  The chest pain is not concerning for heart disease.. -     Troponin I (High Sensitivity); Future -     EKG 12-Lead  Type 2 diabetes mellitus with stage 3b chronic kidney disease, without long-term current use of insulin (HCC) -     Chauncey Mann; Take 1 tablet (20 mg total) by mouth daily. Via Patient Assistance Health and safety inspector)  Dispense: 90 tablet; Refill: 1  Essential hypertension, benign- Her blood pressure is overcontrolled and she has hypokalemia and hypochloridemia.  Will discontinue chlorthalidone. -     Potassium Chloride Crys ER; Take 1 tablet (20 mEq total) by mouth daily. TAKE 1 TABLET BY MOUTH TWICE A DAY  Dispense: 90 tablet; Refill: 1  Hypokalemia -     Potassium Chloride Crys ER; Take 1 tablet (20 mEq total) by mouth daily. TAKE 1 TABLET BY MOUTH TWICE A DAY  Dispense: 90 tablet; Refill: 1     Follow-up: Return in about 4 months (around 12/18/2023).  Sanda Linger, MD

## 2023-08-18 NOTE — Patient Instructions (Signed)
Chest Wall Pain Chest wall pain is pain in or around the bones and muscles of your chest. Sometimes, an injury causes this pain. Excessive coughing or overuse of arm and chest muscles may also cause chest wall pain. Sometimes, the cause may not be known. This pain may take several weeks or longer to get better. Follow these instructions at home: Managing pain, stiffness, and swelling  If directed, put ice on the painful area: Put ice in a plastic bag. Place a towel between your skin and the bag. Leave the ice on for 20 minutes, 2-3 times per day. Activity Rest as told by your health care provider. Avoid activities that cause pain. These include any activities that use your chest muscles or your abdominal and side muscles to lift heavy items. Ask your health care provider what activities are safe for you. General instructions  Take over-the-counter and prescription medicines only as told by your health care provider. Do not use any products that contain nicotine or tobacco, such as cigarettes, e-cigarettes, and chewing tobacco. These can delay healing after injury. If you need help quitting, ask your health care provider. Keep all follow-up visits as told by your health care provider. This is important. Contact a health care provider if: You have a fever. Your chest pain becomes worse. You have new symptoms. Get help right away if: You have nausea or vomiting. You feel sweaty or light-headed. You have a cough with mucus from your lungs (sputum) or you cough up blood. You develop shortness of breath. These symptoms may represent a serious problem that is an emergency. Do not wait to see if the symptoms will go away. Get medical help right away. Call your local emergency services (911 in the [place] Do not drive yourself to the hospital. Summary Chest wall pain is pain in or around the bones and muscles of your chest. Depending on the cause, it may be treated with ice, rest, medicines, and  avoiding activities that cause pain. Contact a health care provider if you have a fever, worsening chest pain, or new symptoms. Get help right away if you feel light-headed or you develop shortness of breath. These symptoms may be an emergency. This information is not intended to replace advice given to you by your health care provider. Make sure you discuss any questions you have with your health care provider. Document Revised: 11/23/2022 Document Reviewed: 11/23/2022 Elsevier Patient Education  2024 [employer]  

## 2023-08-19 ENCOUNTER — Encounter: Payer: Self-pay | Admitting: Internal Medicine

## 2023-08-19 DIAGNOSIS — N1832 Chronic kidney disease, stage 3b: Secondary | ICD-10-CM | POA: Insufficient documentation

## 2023-08-19 DIAGNOSIS — R072 Precordial pain: Secondary | ICD-10-CM | POA: Insufficient documentation

## 2023-08-19 MED ORDER — KERENDIA 20 MG PO TABS
1.0000 | ORAL_TABLET | Freq: Every day | ORAL | 1 refills | Status: DC
Start: 2023-08-19 — End: 2024-03-12

## 2023-08-19 MED ORDER — POTASSIUM CHLORIDE CRYS ER 20 MEQ PO TBCR
20.0000 meq | EXTENDED_RELEASE_TABLET | Freq: Every day | ORAL | 1 refills | Status: DC
Start: 2023-08-19 — End: 2023-11-16

## 2023-08-20 ENCOUNTER — Other Ambulatory Visit: Payer: Self-pay | Admitting: Internal Medicine

## 2023-08-20 DIAGNOSIS — I10 Essential (primary) hypertension: Secondary | ICD-10-CM

## 2023-08-25 ENCOUNTER — Telehealth: Payer: Self-pay | Admitting: Cardiovascular Disease

## 2023-08-25 NOTE — Telephone Encounter (Signed)
Chest pain off an on a week or more.  Describes as "heavy sometimes and dull sometimes, sometimes in chest and other times around my heart." Non radiating. She states she does have "I don't pop out in sweat, I don't sweat but I feel cold and light headed and dizzy." Clammy is how she feels. No headache.  Saw PCP 9/4 and did EKG and recommendations. She has not had any since that visit.  She is set to see NP next week for further evaluation.  ED precautions given and patient states understanding

## 2023-08-25 NOTE — Telephone Encounter (Signed)
Pt c/o of Chest Pain: STAT if CP now or developed within 24 hours  1. Are you having CP right now? No   2. Are you experiencing any other symptoms (ex. SOB, nausea, vomiting, sweating)? Sweating and cold chills   3. How long have you been experiencing CP? Over a week   4. Is your CP continuous or coming and going? Coming and going   5. Have you taken Nitroglycerin? No  ?

## 2023-08-26 ENCOUNTER — Other Ambulatory Visit (HOSPITAL_COMMUNITY): Payer: Self-pay

## 2023-08-26 ENCOUNTER — Telehealth: Payer: Self-pay

## 2023-08-26 NOTE — Telephone Encounter (Signed)
Pharmacy Patient Advocate Encounter   Received notification from CoverMyMeds that prior authorization for Kerendia 20MG  tablets is required/requested.   Insurance verification completed.   The patient is insured through [employer]/ [employer] .   Per test claim: PA required; PA submitted to [employer]/ [employer] via CoverMyMeds Key/confirmation #/[id number] Status is pending

## 2023-08-27 NOTE — Telephone Encounter (Signed)
Per PCP note: Precordial pain- EKG and troponin are reassuring.  The chest pain is not concerning for heart disease.. -     Troponin I (High Sensitivity); Future -     EKG 12-Lead

## 2023-08-31 ENCOUNTER — Ambulatory Visit: Payer: PPO | Attending: Nurse Practitioner | Admitting: Nurse Practitioner

## 2023-08-31 ENCOUNTER — Encounter: Payer: Self-pay | Admitting: Nurse Practitioner

## 2023-08-31 VITALS — BP 122/60 | HR 80 | Ht 66.0 in | Wt 222.6 lb

## 2023-08-31 DIAGNOSIS — I2510 Atherosclerotic heart disease of native coronary artery without angina pectoris: Secondary | ICD-10-CM

## 2023-08-31 DIAGNOSIS — G4733 Obstructive sleep apnea (adult) (pediatric): Secondary | ICD-10-CM | POA: Diagnosis not present

## 2023-08-31 DIAGNOSIS — R0789 Other chest pain: Secondary | ICD-10-CM

## 2023-08-31 DIAGNOSIS — R002 Palpitations: Secondary | ICD-10-CM

## 2023-08-31 DIAGNOSIS — I10 Essential (primary) hypertension: Secondary | ICD-10-CM | POA: Diagnosis not present

## 2023-08-31 DIAGNOSIS — E785 Hyperlipidemia, unspecified: Secondary | ICD-10-CM | POA: Diagnosis not present

## 2023-08-31 NOTE — Patient Instructions (Signed)
Medication Instructions:  No Meds *If you need a refill on your cardiac medications before your next appointment, please call your pharmacy*   Lab Work: CBC and Cmet drawn today. If you have labs (blood work) drawn today and your tests are completely normal, you will receive your results only by: MyChart Message (if you have MyChart) OR A paper copy in the mail If you have any lab test that is abnormal or we need to change your treatment, we will call you to review the results.   Testing/Procedures:  .How to Prepare for Your Cardiac PET/CT Stress Test:  1. Please do not take these medications before your test:   Medications that may interfere with the cardiac pharmacological stress agent (ex. nitrates - including erectile dysfunction medications, isosorbide mononitrate, tamulosin or beta-blockers) the day of the exam. (Erectile dysfunction medication should be held for at least 72 hrs prior to test) Theophylline containing medications for 12 hours. Dipyridamole 48 hours prior to the test. Your remaining medications may be taken with water.  2. Nothing to eat or drink, except water, 3 hours prior to arrival time.   NO caffeine/decaffeinated products, or chocolate 12 hours prior to arrival.  3. NO perfume, cologne or lotion on chest or abdomen area.          - FEMALES - Please avoid wearing dresses to this appointment.  4. Total time is 1 to 2 hours; you may want to bring reading material for the waiting time.  5. Please report to Radiology at the [facility] Main Entrance 30 minutes early for your test.  [address] [place], [place] [address]  6. Please report to Radiology at [facility] Main Entrance, medical mall, 30 mins prior to your test.  [address]  [place], [place]  [phone]  Diabetic Preparation:  Hold oral medications. You may take NPH and Lantus insulin. Do not take Humalog or Humulin R (Regular Insulin) the day  of your test. Check blood sugars prior to leaving the house. If able to eat breakfast prior to 3 hour fasting, you may take all medications, including your insulin, Do not worry if you miss your breakfast dose of insulin - start at your next meal. Patients who wear a continuous glucose monitor MUST remove the device prior to scanning.  IF YOU THINK YOU MAY BE PREGNANT, OR ARE NURSING PLEASE INFORM THE TECHNOLOGIST.  In preparation for your appointment, medication and supplies will be purchased.  Appointment availability is limited, so if you need to cancel or reschedule, please call the Radiology Department at [phone] [facility]) OR [phone] [facility])  24 hours in advance to avoid a cancellation fee of $100.00  What to Expect After you Arrive:  Once you arrive and check in for your appointment, you will be taken to a preparation room within the Radiology Department.  A technologist or Nurse will obtain your medical history, verify that you are correctly prepped for the exam, and explain the procedure.  Afterwards,  an IV will be started in your arm and electrodes will be placed on your skin for EKG monitoring during the stress portion of the exam. Then you will be escorted to the PET/CT scanner.  There, staff will get you positioned on the scanner and obtain a blood pressure and EKG.  During the exam, you will continue to be connected to the EKG and blood pressure machines.  A small, safe amount of a radioactive tracer will be injected  in your IV to obtain a series of pictures of your heart along with an injection of a stress agent.    After your Exam:  It is recommended that you eat a meal and drink a caffeinated beverage to counter act any effects of the stress agent.  Drink plenty of fluids for the remainder of the day and urinate frequently for the first couple of hours after the exam.  Your doctor will inform you of your test results within 7-10 business days.  For more information and  frequently asked questions, please visit our website : [email or web address]  For questions about your test or how to prepare for your test, please call: Cardiac Imaging Nurse Navigators Office: [phone]   Follow-Up: At [facility], you and your health needs are our priority.  As part of our continuing mission to provide you with exceptional heart care, we have created designated Provider Care Teams.  These Care Teams include your primary Cardiologist (physician) and Advanced Practice Providers (APPs -  Physician Assistants and Nurse Practitioners) who all work together to provide you with the care you need, when you need it.  We recommend signing up for the patient portal called "MyChart".  Sign up information is provided on this After Visit Summary.  MyChart is used to connect with patients for Virtual Visits (Telemedicine).  Patients are able to view lab/test results, encounter notes, upcoming appointments, etc.  Non-urgent messages can be sent to your provider as well.   To learn more about what you can do with MyChart, go to [email or web address].    Your next appointment:   6-8 week(s)  Provider:   Bernadene Person, NP

## 2023-08-31 NOTE — Progress Notes (Signed)
Office Visit    Patient Name: Valerie [name] Date of Encounter: 08/31/2023  Primary Care Provider:  Etta Grandchild, MD Primary Cardiologist:  Nanetta Batty, MD  Chief Complaint    77 year old female with a history of elevated coronary artery calcium score/nonobstructive CAD, palpitations, hypertension, hyperlipidemia, OSA, GERD, and hypothyroidism who presents for follow-up related to CAD and hypertension.  Past Medical History    Past Medical History:  Diagnosis Date   Anemia    Anxiety and depression    Arthritis    Asthma    Cataract    Degenerative arthritis    Depression    Diabetes mellitus, type 2 (HCC)    Fatigue    GERD (gastroesophageal reflux disease)    Glaucoma    Heart palpitations    Hemorrhoids    Hyperlipidemia    Hypertension    Hypothyroidism    Hypoxemia 11/24/2013   Memory deficit 10/04/2013   Obesity    Sleep apnea    BiPAP   Snoring disorder    Past Surgical History:  Procedure Laterality Date   benign tumors resected     CATARACT EXTRACTION Bilateral    FOOT SURGERY Left    bone spur   ROTATOR CUFF REPAIR Left    TUBAL LIGATION      Allergies  Allergies  Allergen Reactions   Food Swelling    bananas   Penicillins Swelling and Rash     Labs/Other Studies Reviewed    The following studies were reviewed today:  Cardiac Studies & Procedures     STRESS TESTS  MYOCARDIAL PERFUSION IMAGING 10/07/2018  Narrative  Nuclear stress EF: 75%.  Normal perfusion  The study is normal.  This is a low risk study.   ECHOCARDIOGRAM  ECHOCARDIOGRAM COMPLETE 10/03/2018  Narrative *[facility]* [address] [place], [place] [address] [phone]  ------------------------------------------------------------------- Transthoracic Echocardiography  Patient:    Valerie, [name] MR #:       [id number] Study Date: 10/03/2018 Gender:     F Age:        72 Height:     167.6 cm Weight:     114.5 kg BSA:        2.37  m^2 Pt. Status: Room:  ORDERING     Nanetta Batty, MD REFERRING    Nanetta Batty, MD SONOGRAPHER  Clearence Ped, RCS PERFORMING   [facility], Outpatient ATTENDING    Chilton Si, MD  cc:  ------------------------------------------------------------------- LV EF: 60% -   65%  ------------------------------------------------------------------- Indications:      Chest Pain (R07.89).  ------------------------------------------------------------------- History:   PMH:  OSA.  Dyspnea.  Risk factors:  Hypertension. Diabetes mellitus.  ------------------------------------------------------------------- Study Conclusions  - Left ventricle: The cavity size was normal. Wall thickness was increased in a pattern of mild LVH. Systolic function was normal. The estimated ejection fraction was in the range of 60% to 65%. Wall motion was normal; there were no regional wall motion abnormalities. Doppler parameters are consistent with abnormal left ventricular relaxation (grade 1 diastolic dysfunction). Doppler parameters are consistent with indeterminate ventricular filling pressure. - Aortic valve: Transvalvular velocity was within the normal range. There was no stenosis. There was no regurgitation. - Mitral valve: Transvalvular velocity was within the normal range. There was no evidence for stenosis. There was no regurgitation. - Right ventricle: The cavity size was normal. Wall thickness was normal. Systolic function was normal. - Atrial septum: No defect or patent foramen ovale was identified by color flow  Doppler. - Tricuspid valve: There was no regurgitation. - Global longitudinal strain -24.6% (normal).  ------------------------------------------------------------------- Study data:  Comparison was made to the study of 08/29/2015.  Study status:  Routine.  Procedure:  The patient reported no pain pre or post test. Transthoracic echocardiography. Image quality was adequate.           Transthoracic echocardiography.  M-mode, complete 2D, spectral Doppler, and color Doppler.  Birthdate: Patient birthdate: 09-13-1946.  Age:  Patient is 77 yr old.  Sex: Gender: female.    BMI: 40.8 kg/m^2.  Blood pressure:     131/70 Patient status:  Outpatient.  Study date:  Study date: 10/03/2018. Study time: 10:17 AM.  Location:  [facility]  -------------------------------------------------------------------  ------------------------------------------------------------------- Left ventricle:  The cavity size was normal. Wall thickness was increased in a pattern of mild LVH. Systolic function was normal. The estimated ejection fraction was in the range of 60% to 65%. Wall motion was normal; there were no regional wall motion abnormalities. Doppler parameters are consistent with abnormal left ventricular relaxation (grade 1 diastolic dysfunction). Doppler parameters are consistent with indeterminate ventricular filling pressure.  ------------------------------------------------------------------- Aortic valve:  Poorly visualized.  Trileaflet; normal thickness leaflets. Mobility was not restricted.  Doppler:  Transvalvular velocity was within the normal range. There was no stenosis. There was no regurgitation.  ------------------------------------------------------------------- Aorta:  Aortic root: The aortic root was normal in size.  ------------------------------------------------------------------- Mitral valve:   Structurally normal valve.   Mobility was not restricted.  Doppler:  Transvalvular velocity was within the normal range. There was no evidence for stenosis. There was no regurgitation.    Peak gradient (D): 4 mm Hg.  ------------------------------------------------------------------- Left atrium:  The atrium was normal in size.  ------------------------------------------------------------------- Atrial septum:  No defect or patent foramen ovale was  identified by color flow Doppler.  ------------------------------------------------------------------- Right ventricle:  The cavity size was normal. Wall thickness was normal. Systolic function was normal.  ------------------------------------------------------------------- Pulmonic valve:    Structurally normal valve.   Cusp separation was normal.  Doppler:  Transvalvular velocity was within the normal range. There was no evidence for stenosis. There was no regurgitation.  ------------------------------------------------------------------- Tricuspid valve:   Structurally normal valve.    Doppler: Transvalvular velocity was within the normal range. There was no regurgitation.  ------------------------------------------------------------------- Pulmonary artery:   The main pulmonary artery was normal-sized. Systolic pressure could not be accurately estimated.  ------------------------------------------------------------------- Right atrium:  The atrium was normal in size.  ------------------------------------------------------------------- Pericardium:  There was no pericardial effusion.  ------------------------------------------------------------------- Systemic veins: Inferior vena cava: The vessel was normal in size. The respirophasic diameter changes were in the normal range (= 50%), consistent with normal central venous pressure. Diameter: 17 mm.  ------------------------------------------------------------------- Measurements  IVC                                   Value        Reference ID                                    17    mm     ----------  Left ventricle                        Value        Reference LV ID, ED, PLAX chordal        (  L)    38.5  mm     43 - 52 LV ID, ES, PLAX chordal               28.9  mm     23 - 38 LV fx shortening, PLAX chordal (L)    25    %      >=29 LV PW thickness, ED                   13.5  mm     ---------- IVS/LV PW ratio, ED                    0.78         <=1.3 Stroke volume, 2D                     76    ml     ---------- Stroke volume/bsa, 2D                 32    ml/m^2 ---------- LV e&', lateral                        6.64  cm/s   ---------- LV E/e&', lateral                      14.17        ---------- LV e&', medial                         7.29  cm/s   ---------- LV E/e&', medial                       12.91        ---------- LV e&', average                        6.97  cm/s   ---------- LV E/e&', average                      13.51        ---------- Longitudinal strain, TDI              25    %      ----------  Ventricular septum                    Value        Reference IVS thickness, ED                     10.5  mm     ----------  LVOT                                  Value        Reference LVOT ID, S                            19    mm     ---------- LVOT area                             2.84  cm^2   ---------- LVOT peak velocity, S  110   cm/s   ---------- LVOT mean velocity, S                 75.9  cm/s   ---------- LVOT VTI, S                           26.9  cm     ----------  Aorta                                 Value        Reference Aortic root ID, ED                    29    mm     ----------  Left atrium                           Value        Reference LA ID, A-P, ES                        31    mm     ---------- LA ID/bsa, A-P                        1.31  cm/m^2 <=2.2 LA volume, S                          37.2  ml     ---------- LA volume/bsa, S                      15.7  ml/m^2 ---------- LA volume, ES, 1-p A4C                39    ml     ---------- LA volume/bsa, ES, 1-p A4C            16.5  ml/m^2 ---------- LA volume, ES, 1-p A2C                37.3  ml     ---------- LA volume/bsa, ES, 1-p A2C            15.8  ml/m^2 ----------  Mitral valve                          Value        Reference Mitral E-wave peak velocity           94.1  cm/s   ---------- Mitral A-wave peak  velocity           124   cm/s   ---------- Mitral deceleration time              176   ms     150 - 230 Mitral peak gradient, D               4     mm Hg  ---------- Mitral E/A ratio, peak                0.8          ----------  Right atrium  Value        Reference RA ID, S-I, ES, A4C                   43.1  mm     34 - 49 RA area, ES, A4C                      13.7  cm^2   8.3 - 19.5 RA volume, ES, A/L                    36.5  ml     ---------- RA volume/bsa, ES, A/L                15.4  ml/m^2 ----------  Systemic veins                        Value        Reference Estimated CVP                         3     mm Hg  ----------  Right ventricle                       Value        Reference TAPSE                                 20.9  mm     ---------- RV s&', lateral, S                     11.3  cm/s   ----------  Legend: (L)  and  (H)  mark values outside specified reference range.  ------------------------------------------------------------------- Prepared and Electronically Authenticated by  Chilton Si, MD 2019-10-21T13:44:17    MONITORS  CARDIAC EVENT MONITOR 04/04/2020  Narrative 1. NSR with occasional PVCs 2. Short atrial runs   CT SCANS  CT CORONARY MORPH W/CTA COR W/SCORE 04/06/2022  Addendum 04/06/2022 12:02 PM ADDENDUM REPORT: 04/06/2022 12:00  EXAM: OVER-READ INTERPRETATION  PET-CT CHEST  The following report is an over-read performed by radiologist Dr. Leatha Gilding [facility] on 04/06/2022. This over-read does not include interpretation of cardiac or coronary anatomy or pathology. The cardiac CT interpretation by the cardiologist is to be attached.  COMPARISON:  CT a chest 08/29/2015  FINDINGS: The visualized lung parenchyma shows no suspicious pulmonary nodule or mass. Calcified granuloma noted left lower lobe. No focal airspace consolidation. No effusion.  No evidence for lymphadenopathy within the  visualized mediastinum or hilar regions. Tiny hiatal hernia noted.  No suspicious lytic or sclerotic osseous abnormality.  IMPRESSION: No acute or clinically significant extracardiac findings.   Electronically Signed By: Kennith Center M.D. On: 04/06/2022 12:00  Narrative CLINICAL DATA:  63F with chest pain  EXAM: Cardiac/Coronary CTA  TECHNIQUE: The patient was scanned on a [employer].  FINDINGS: A 100 kV prospective scan was triggered in the descending thoracic aorta at 111 HU's. Axial non-contrast 3 mm slices were carried out through the heart. The data set was analyzed on a dedicated work station and scored using the Agatson method. Gantry rotation speed was 250 msecs and collimation was .6 mm. No beta blockade and 0.8 mg of sl NTG was given. The 3D data set was reconstructed in 5% intervals of the 67-82 %  of the R-R cycle. Diastolic phases were analyzed on a dedicated work station using MPR, MIP and VRT modes. The patient received 80 cc of contrast.  Coronary Arteries:  Normal coronary origin.  Right dominance.  RCA is a large dominant artery that gives rise to PDA and PLA. Calcified plaque in the proximal RCA causes 0-24% stenosis. Calcified plaque in the mid RCA causes 25-49% stenosis.  Left main is a large artery that gives rise to LAD and LCX arteries.  LAD is a large vessel. Calcified plaque in the proximal LAD causes 0-24% stenosis. Calcified plaque in the mid LAD causes 25-49% stenosis. Calcified plaque in the distal LAD causes 25-49% stenosis  LCX is a non-dominant artery that gives rise to one large OM1 branch. Calcified plaque in the proximal LCX causes 0-24% stenosis. Calcified plaque in the mid LCX causes 0-24% stenosis  Calcified plaque in ramus causes 25-49% stenosis  Other findings:  Left Ventricle: Normal size  Left Atrium: Normal size  Pulmonary Veins: Normal configuration  Right Ventricle: Normal size  Right Atrium: Normal  size  Cardiac valves: No calcifications  Thoracic aorta: Normal size  Pulmonary Arteries: Normal size  Systemic Veins: Normal drainage  Pericardium: Normal thickness  IMPRESSION: 1. Coronary calcium score of 454. This was 90th percentile for age and sex matched control.  2.  Normal coronary origin with right dominance.  3.  Nonobstructive CAD  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  PFO  CAD-RADS 2. Mild non-obstructive CAD (25-49%). Consider non-atherosclerotic causes of chest pain. Consider preventive therapy and risk factor modification.  Electronically Signed: By: Epifanio Lesches M.D. On: 04/06/2022 11:26   CT SCANS  CT CARDIAC SCORING (SELF PAY ONLY) 02/12/2022  Addendum 02/12/2022  4:39 PM ADDENDUM REPORT: 02/12/2022 16:37  EXAM: OVER-READ INTERPRETATION  CT CHEST  The following report is an over-read performed by radiologist Dr. Marinda Elk [facility] on 02/12/2022. This over-read does not include interpretation of cardiac or coronary anatomy or pathology. The coronary calcium score interpretation by the cardiologist is attached.  COMPARISON:  CT a of the chest on 08/29/2015  FINDINGS: Atherosclerosis of the thoracic aorta. Visualized mediastinum and hilar regions demonstrate no lymphadenopathy or masses. Stable small hiatal hernia. Visualized lungs show no evidence of pulmonary edema, consolidation, pneumothorax, nodule or pleural fluid. Visualized upper abdomen and bony structures are unremarkable.  IMPRESSION: 1. Atherosclerosis of the thoracic aorta. 2. Stable small hiatal hernia.   Electronically Signed By: Irish Lack M.D. On: 02/12/2022 16:37  Narrative CLINICAL DATA:  Cardiovascular Disease Risk stratification  EXAM: Coronary Calcium Score  TECHNIQUE: A gated, non-contrast computed tomography scan of the heart was performed using 3mm slice thickness. Axial images were analyzed on a dedicated  workstation. Calcium scoring of the coronary arteries was performed using the Agatston method.  FINDINGS: Coronary arteries: Normal origins.  Coronary Calcium Score:  Left main: 47.6  Left anterior descending artery: 126  Left circumflex artery: 266  Right coronary artery: 60.2  Total: 499  Percentile: 92nd  Pericardium: Normal.  Ascending Aorta: Normal caliber. Atherosclerosis of the ascending and descending aorta.  Non-cardiac: See separate report from [facility].  IMPRESSION: Coronary calcium score of 499. This was 92nd percentile for age-, race-, and sex-matched controls.  RECOMMENDATIONS: Coronary artery calcium (CAC) score is a strong predictor of incident coronary heart disease (CHD) and provides predictive information beyond traditional risk factors. CAC scoring is reasonable to use in the decision to withhold, postpone, or initiate statin  therapy in intermediate-risk or selected borderline-risk asymptomatic adults (age 54-75 years and LDL-C >=70 to <190 mg/dL) who do not have diabetes or established atherosclerotic cardiovascular disease (ASCVD).* In intermediate-risk (10-year ASCVD risk >=7.5% to <20%) adults or selected borderline-risk (10-year ASCVD risk >=5% to <7.5%) adults in whom a CAC score is measured for the purpose of making a treatment decision the following recommendations have been made:  If CAC=0, it is reasonable to withhold statin therapy and reassess in 5 to 10 years, as long as higher risk conditions are absent (diabetes mellitus, family history of premature CHD in first degree relatives (males <55 years; females <65 years), cigarette smoking, or LDL >=190 mg/dL).  If CAC is 1 to 99, it is reasonable to initiate statin therapy for patients >=62 years of age.  If CAC is >=100 or >=75th percentile, it is reasonable to initiate statin therapy at any age.  Cardiology referral should be considered for patients with CAC scores  >=400 or >=75th percentile.  *2018 AHA/ACC/AACVPR/AAPA/ABC/ACPM/ADA/AGS/APhA/ASPC/NLA/PCNA Guideline on the Management of Blood Cholesterol: A Report of the American College of Cardiology/American Heart Association Task Force on Clinical Practice Guidelines. J Am Coll Cardiol. 2019;73(24):3168-3209.  Armanda Magic, MD  Electronically Signed: By: Armanda Magic M.D. On: 02/12/2022 12:20         Recent Labs: 02/09/2023: ALT 15; TSH 3.00 08/18/2023: BUN 18; Creatinine, Ser 1.51; Hemoglobin 11.6; Platelets 348.0; Potassium 3.4; Sodium 136  Recent Lipid Panel    Component Value Date/Time   CHOL 152 05/11/2023 1400   TRIG 253.0 (H) 05/11/2023 1400   HDL 48.90 05/11/2023 1400   CHOLHDL 3 05/11/2023 1400   VLDL 50.6 (H) 05/11/2023 1400   LDLCALC 56 05/05/2022 1356   LDLDIRECT 60.0 05/11/2023 1400    History of Present Illness    77 year old female with the above past medical history including elevated coronary artery calcium score/nonobstructive CAD, palpitations, hypertension, hyperlipidemia, OSA, GERD, and hypothyroidism.  She was initially referred to cardiology in the setting of palpitations.  Echocardiogram and Myoview in 2019 were normal.  Outpatient cardiac monitor in 03/2020 showed sinus rhythm with PVCs, short atrial runs, no significant arrhythmia.  Coronary CTA in 03/2022 showed coronary calcium score of 454 (90th percentile), nonobstructive CAD.  She was last seen in the office on 01/12/2019 form was stable from a cardiac standpoint.  She denies symptoms concerning for angina.  She noted occasional palpitations.  She saw her PCP on 08/18/2023 in the setting of chest pain.  EKG and troponin were reassuring.   She presents today for follow-up accompanied by her grandson. Since her last visit she has been stable from a cardiac standpoint. She does note a several week history of intermittent sharp chest pain/chest tightness both at rest and with exertion.  Her symptoms can last from minutes  to hours at a time.  She has stable chronic dyspnea patient states she has asthma).  She also notes intermittent lightheadedness. She denies palpitations, presyncope, or syncope.  She is concerned that her chest pain is related to her underlying coronary artery disease.  Home Medications    Current Outpatient Medications  Medication Sig Dispense Refill   albuterol (PROVENTIL) (2.5 MG/3ML) 0.083% nebulizer solution Take 3 mLs (2.5 mg total) by nebulization every 6 (six) hours as needed for wheezing or shortness of breath. 75 mL 3   albuterol (VENTOLIN HFA) 108 (90 Base) MCG/ACT inhaler Inhale 2 puffs into the lungs every 6 (six) hours as needed. For shortness of breath 3 each 4  aspirin 81 MG tablet Take 81 mg by mouth daily.      atorvastatin (LIPITOR) 20 MG tablet TAKE 1 TABLET BY MOUTH EVERY DAY 90 tablet 0   Blood Glucose Calibration (ONETOUCH VERIO) LIQD 1 Act by In Vitro route daily. 1 each 5   Blood Glucose Monitoring Suppl (ONETOUCH VERIO FLEX SYSTEM) DEVI 1 Act by Does not apply route 2 (two) times daily. 1 each 3   budesonide-formoterol (SYMBICORT) 80-4.5 MCG/ACT inhaler Inhale 2 puffs into the lungs 2 (two) times daily as needed. 3 each 1   carvedilol (COREG) 25 MG tablet TAKE 1 TABLET BY MOUTH TWICE A DAY WITH FOOD 180 tablet 0   Cholecalciferol (VITAMIN D3) 50 MCG (2000 UT) capsule TAKE 1 CAPSULE BY MOUTH EVERY DAY 90 capsule 1   clobetasol ointment (TEMOVATE) 0.05 % Apply 1 application topically 2 (two) times daily. 60 g 1   Continuous Blood Gluc Receiver (FREESTYLE LIBRE 2 READER) DEVI 1 Act by Does not apply route daily. 2 each 5   Continuous Blood Gluc Sensor (FREESTYLE LIBRE 2 SENSOR) MISC 1 Act by Does not apply route daily. 2 each 5   Dulaglutide (TRULICITY) 1.5 MG/0.5ML SOPN Inject 1.5 mg into the skin once a week. 6 mL 0   famotidine (PEPCID) 40 MG tablet TAKE 1 TABLET BY MOUTH EVERY DAY 90 tablet 1   ferrous sulfate 325 (65 FE) MG tablet TAKE 1 TABLET BY MOUTH 3 TIMES DAILY  WITH MEALS. 270 tablet 0   Finerenone (KERENDIA) 20 MG TABS Take 1 tablet (20 mg total) by mouth daily. 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 DX: E11.8 300 strip 1   irbesartan (AVAPRO) 300 MG tablet Take 0.5 tablets (150 mg total) by mouth daily. 45 tablet 1   latanoprost (XALATAN) 0.005 % ophthalmic solution Place 1 drop into both eyes at bedtime.      ONETOUCH VERIO test strip 1 EACH BY OTHER ROUTE 2 (TWO) TIMES DAILY. USE AS INSTRUCTED 200 strip 1   oxyCODONE-acetaminophen (PERCOCET/ROXICET) 5-325 MG tablet Take 1 tablet by mouth every 8 (eight) hours as needed for severe pain. 90 tablet 0   pantoprazole (PROTONIX) 40 MG tablet TAKE 1 TABLET BY MOUTH 2 TIMES DAILY BEFORE A MEAL 180 tablet 0   potassium chloride SA (KLOR-CON M20) 20 MEQ tablet Take 1 tablet (20 mEq total) by mouth daily. TAKE 1 TABLET BY MOUTH TWICE A DAY 90 tablet 1   SYNJARDY 12.5-500 MG TABS TAKE 1 TABLET BY MOUTH 2 (TWO) TIMES DAILY. TAKE 2 TABLETS DAILY. 180 tablet 1   No current facility-administered medications for this visit.     Review of Systems    She denies palpitations, pnd, orthopnea, n, v, dizziness, syncope, edema, weight gain, or early satiety. All other systems reviewed and are otherwise negative except as noted above.   Physical Exam    VS:  BP 122/60   Pulse 80   Ht 5\' 6"  (1.676 m)   Wt 222 lb 9.6 oz (101 kg)   SpO2 98%   BMI 35.93 kg/m  GEN: Well nourished, well developed, in no acute distress. HEENT: normal. Neck: Supple, no JVD, carotid bruits, or masses. Cardiac: RRR, no murmurs, rubs, or gallops. No clubbing, cyanosis, edema.  Radials/DP/PT 2+ and equal bilaterally.  Respiratory:  Respirations regular and unlabored, clear to auscultation bilaterally. GI: Soft, nontender, nondistended, BS + x 4. MS: no  deformity or atrophy. Skin: warm and dry, no rash. Neuro:  Strength and sensation are intact. Psych: Normal affect. Orthostatic VS for the past 24 hrs (Last 3 readings):  BP- Lying Pulse- Lying BP- Sitting Pulse- Sitting BP- Standing at 0 minutes Pulse- Standing at 0 minutes BP- Standing at 3 minutes Pulse- Standing at 3 minutes  08/31/23 1607 108/65 81 112/70 82 108/69 84 119/72 85     Accessory Clinical Findings    ECG personally reviewed by me today - EKG Interpretation Date/Time:  Tuesday August 31 2023 15:23:15 EDT Ventricular Rate:  82 PR Interval:  146 QRS Duration:  72 QT Interval:  376 QTC Calculation: 439 R Axis:   47  Text Interpretation: Normal sinus rhythm Nonspecific T wave abnormality When compared with ECG of 29-Aug-2015 05:01, No significant change was found Confirmed by Bernadene Person ([id number]) on 08/31/2023 3:23:57 PM  - no acute changes.   Lab Results  Component Value Date   WBC 10.3 08/18/2023   HGB 11.6 (L) 08/18/2023   HCT 36.3 08/18/2023   MCV 91.0 08/18/2023   PLT 348.0 08/18/2023   Lab Results  Component Value Date   CREATININE 1.51 (H) 08/18/2023   BUN 18 08/18/2023   NA 136 08/18/2023   K 3.4 (L) 08/18/2023   CL 94 (L) 08/18/2023   CO2 29 08/18/2023   Lab Results  Component Value Date   ALT 15 02/09/2023   AST 13 02/09/2023   ALKPHOS 101 02/09/2023   BILITOT 0.4 02/09/2023   Lab Results  Component Value Date   CHOL 152 05/11/2023   HDL 48.90 05/11/2023   LDLCALC 56 05/05/2022   LDLDIRECT 60.0 05/11/2023   TRIG 253.0 (H) 05/11/2023   CHOLHDL 3 05/11/2023    Lab Results  Component Value Date   HGBA1C 6.3 08/18/2023    Assessment & Plan    1. CAD/chest pain/dyspnea on exertion: Coronary CTA in 03/2022 showed coronary calcium score of 454 (90th percentile), nonobstructive CAD.  She notes a several week history of intermittent left-sided chest tightness/sharp pain both at rest and with exertion.  Symptoms can last from minutes to  hours at a time.  She has stable chronic dyspnea.  She notes both her brother and her father had an MI.  She is very concerned that her symptoms are coming from her heart.  She did have labs drawn recently through her PCP which showed slightly worsening renal function, hypokalemia and mild anemia.  Troponin was negative.  EKG was stable. Through shared decision making, will pursue ischemic evaluation with cardiac PET stress test.  Will repeat BMET, CBC.  Reviewed ED precautions. Continue aspirin, carvedilol, irbesartan and Lipitor.  Informed Consent   Shared Decision Making/Informed Consent The risks [chest pain, shortness of breath, cardiac arrhythmias, dizziness, blood pressure fluctuations, myocardial infarction, stroke/transient ischemic attack, nausea, vomiting, allergic reaction, radiation exposure, metallic taste sensation and life-threatening complications (estimated to be 1 in 10,000)], benefits (risk stratification, diagnosing coronary artery disease, treatment guidance) and alternatives of a cardiac PET stress test were discussed in detail with Ms. Edgell and she agrees to proceed.     2. Palpitations/lightheadedness: Outpatient cardiac monitor in 03/2020 showed sinus rhythm with PVCs, short atrial runs, no significant arrhythmia.  Denies any recent palpitations.  She does note occasional lightheadedness. Orthostatics negative in office today, blood pressure stable.  Will pursue ischemic evaluation as  above.  3. Hypertension: BP well controlled. Continue current antihypertensive regimen.   4. Hyperlipidemia: LDL was 56 in 04/2022.  Continue Lipitor.  5. OSA: Adherent to CPAP.  6. Disposition: Follow-up in 6-8 weeks.       Joylene Grapes, NP 08/31/2023, 7:43 PM

## 2023-09-01 ENCOUNTER — Telehealth: Payer: Self-pay

## 2023-09-01 NOTE — Telephone Encounter (Signed)
Spoke with pt. Pt was notified of lab results. Pt will f/u with PCP for the anemia, continue her current medication and f/u as planned.

## 2023-09-01 NOTE — Telephone Encounter (Signed)
Pharmacy Patient Advocate Encounter  Received notification from [employer]/ [employer] that Prior Authorization for [facility]MG  has been DENIED.  See denial reason below. No denial letter attached in CMM. Will attache denial letter to Media tab once received.   PA #/Case ID/Reference #: [id number]

## 2023-09-06 ENCOUNTER — Ambulatory Visit (INDEPENDENT_AMBULATORY_CARE_PROVIDER_SITE_OTHER): Payer: PPO

## 2023-09-06 DIAGNOSIS — E538 Deficiency of other specified B group vitamins: Secondary | ICD-10-CM

## 2023-09-06 MED ORDER — CYANOCOBALAMIN 1000 MCG/ML IJ SOLN
1000.0000 ug | Freq: Once | INTRAMUSCULAR | Status: AC
Start: 2023-09-06 — End: 2023-09-06
  Administered 2023-09-06: 1000 ug via INTRAMUSCULAR

## 2023-09-06 NOTE — Progress Notes (Signed)
Pt here for monthly B12 injection per Dr. Yetta Barre   B12 given IM and pt tolerated injection well.  Next B12 injection scheduled for 10/24  Patient was advised to report to the office immediately if she noticed any adverse reaction.

## 2023-09-14 ENCOUNTER — Other Ambulatory Visit: Payer: Self-pay | Admitting: Internal Medicine

## 2023-09-14 DIAGNOSIS — D509 Iron deficiency anemia, unspecified: Secondary | ICD-10-CM

## 2023-09-23 ENCOUNTER — Other Ambulatory Visit: Payer: Self-pay | Admitting: Internal Medicine

## 2023-09-23 DIAGNOSIS — I10 Essential (primary) hypertension: Secondary | ICD-10-CM

## 2023-09-28 DIAGNOSIS — E119 Type 2 diabetes mellitus without complications: Secondary | ICD-10-CM | POA: Diagnosis not present

## 2023-09-28 DIAGNOSIS — Z7985 Long-term (current) use of injectable non-insulin antidiabetic drugs: Secondary | ICD-10-CM | POA: Diagnosis not present

## 2023-09-28 DIAGNOSIS — Z7984 Long term (current) use of oral hypoglycemic drugs: Secondary | ICD-10-CM | POA: Diagnosis not present

## 2023-09-28 DIAGNOSIS — K219 Gastro-esophageal reflux disease without esophagitis: Secondary | ICD-10-CM | POA: Diagnosis not present

## 2023-09-28 DIAGNOSIS — I10 Essential (primary) hypertension: Secondary | ICD-10-CM | POA: Diagnosis not present

## 2023-09-28 DIAGNOSIS — E785 Hyperlipidemia, unspecified: Secondary | ICD-10-CM | POA: Diagnosis not present

## 2023-09-28 DIAGNOSIS — Z6835 Body mass index (BMI) 35.0-35.9, adult: Secondary | ICD-10-CM | POA: Diagnosis not present

## 2023-09-30 DIAGNOSIS — G4733 Obstructive sleep apnea (adult) (pediatric): Secondary | ICD-10-CM | POA: Diagnosis not present

## 2023-09-30 DIAGNOSIS — G479 Sleep disorder, unspecified: Secondary | ICD-10-CM | POA: Diagnosis not present

## 2023-10-06 ENCOUNTER — Other Ambulatory Visit: Payer: Self-pay

## 2023-10-06 DIAGNOSIS — R0789 Other chest pain: Secondary | ICD-10-CM

## 2023-10-07 ENCOUNTER — Ambulatory Visit: Payer: PPO | Admitting: Radiology

## 2023-10-07 DIAGNOSIS — E538 Deficiency of other specified B group vitamins: Secondary | ICD-10-CM

## 2023-10-07 MED ORDER — CYANOCOBALAMIN 1000 MCG/ML IJ SOLN
1000.0000 ug | Freq: Once | INTRAMUSCULAR | Status: AC
Start: 2023-10-07 — End: 2023-10-07
  Administered 2023-10-07: 1000 ug via INTRAMUSCULAR

## 2023-10-07 NOTE — Progress Notes (Signed)
Patient here for monthly B12 injections. Patient tolerated well with no complications

## 2023-10-10 ENCOUNTER — Other Ambulatory Visit: Payer: Self-pay | Admitting: Internal Medicine

## 2023-10-10 DIAGNOSIS — E118 Type 2 diabetes mellitus with unspecified complications: Secondary | ICD-10-CM

## 2023-10-11 ENCOUNTER — Ambulatory Visit: Payer: PPO | Admitting: Nurse Practitioner

## 2023-10-15 ENCOUNTER — Other Ambulatory Visit: Payer: Self-pay | Admitting: Internal Medicine

## 2023-10-27 DIAGNOSIS — Z1231 Encounter for screening mammogram for malignant neoplasm of breast: Secondary | ICD-10-CM | POA: Diagnosis not present

## 2023-10-27 LAB — HM MAMMOGRAPHY

## 2023-10-28 ENCOUNTER — Telehealth: Payer: Self-pay | Admitting: Cardiovascular Disease

## 2023-10-28 NOTE — Telephone Encounter (Signed)
Chart review showed patient is to have next appointment following PET scan so rescheduled her to 12/13/23 with Bernadene Person. .   Patient was unsure if she should still make appt with doctor berry if she is seeing emily at the end of this month. Dr. Allyson Sabal did not have an available appt until feb 2025. Informed her I would send a message to emily, dr. Allyson Sabal and his nurse so they are aware and can make any necessary changes to appt.

## 2023-10-28 NOTE — Telephone Encounter (Signed)
Pt is requesting a callback regarding her wanting to know if she should keep her upcoming appt or move it til after 12/5. Pt would like to speak with a nurse before moving appts around. Please advise

## 2023-10-29 ENCOUNTER — Encounter: Payer: Self-pay | Admitting: Internal Medicine

## 2023-10-29 NOTE — Telephone Encounter (Signed)
Patient made aware that next appt with dr berry will be addressed at 12/30 with emily. No further questions at this time

## 2023-11-04 ENCOUNTER — Ambulatory Visit: Payer: PPO | Admitting: Nurse Practitioner

## 2023-11-08 ENCOUNTER — Ambulatory Visit: Payer: PPO

## 2023-11-10 ENCOUNTER — Other Ambulatory Visit: Payer: Self-pay | Admitting: Internal Medicine

## 2023-11-10 DIAGNOSIS — D509 Iron deficiency anemia, unspecified: Secondary | ICD-10-CM

## 2023-11-16 ENCOUNTER — Other Ambulatory Visit: Payer: Self-pay | Admitting: Internal Medicine

## 2023-11-16 ENCOUNTER — Telehealth (HOSPITAL_COMMUNITY): Payer: Self-pay | Admitting: *Deleted

## 2023-11-16 DIAGNOSIS — I10 Essential (primary) hypertension: Secondary | ICD-10-CM

## 2023-11-16 DIAGNOSIS — E876 Hypokalemia: Secondary | ICD-10-CM

## 2023-11-16 MED ORDER — POTASSIUM CHLORIDE CRYS ER 20 MEQ PO TBCR: 20.0000 meq | EXTENDED_RELEASE_TABLET | Freq: Every day | ORAL | 0 refills | Status: DC

## 2023-11-16 NOTE — Telephone Encounter (Signed)
Reaching out to patient to offer assistance regarding upcoming cardiac imaging study; pt verbalizes understanding of appt date/time, parking situation and where to check in, pre-test NPO status  and verified current allergies; name and call back number provided for further questions should they arise  Shaneeka Scarboro RN Navigator Cardiac Imaging [facility] [phone] office [phone] cell  Patient aware to avoid caffeine 12 hours prior to her cardiac PET scan. 

## 2023-11-18 ENCOUNTER — Ambulatory Visit
Admission: RE | Admit: 2023-11-18 | Discharge: 2023-11-18 | Disposition: A | Discharge status: Home / Self Care | Payer: PPO | Source: Ambulatory Visit | Attending: Nurse Practitioner | Admitting: Nurse Practitioner

## 2023-11-18 DIAGNOSIS — I700 Atherosclerosis of aorta: Secondary | ICD-10-CM | POA: Insufficient documentation

## 2023-11-18 DIAGNOSIS — R0789 Other chest pain: Secondary | ICD-10-CM

## 2023-11-18 DIAGNOSIS — I25118 Atherosclerotic heart disease of native coronary artery with other forms of angina pectoris: Secondary | ICD-10-CM | POA: Insufficient documentation

## 2023-11-18 DIAGNOSIS — K449 Diaphragmatic hernia without obstruction or gangrene: Secondary | ICD-10-CM | POA: Insufficient documentation

## 2023-11-18 LAB — NM PET CT CARDIAC PERFUSION MULTI W/ABSOLUTE BLOODFLOW
MBFR: 1.98
Nuc Rest EF: 69 %
Nuc Stress EF: 74 %
Peak HR: 83 {beats}/min
Rest HR: 71 {beats}/min
Rest MBF: 0.96 ml/g/min
Rest Nuclear Isotope Dose: 25 mCi
SRS: 0
SSS: 1
ST Depression (mm): 0 mm
Stress MBF: 1.9 ml/g/min
Stress Nuclear Isotope Dose: 24.9 mCi
TID: 1.03

## 2023-11-18 MED ORDER — REGADENOSON 0.4 MG/5ML IV SOLN
INTRAVENOUS | Status: AC
Start: 2023-11-18 — End: ?
  Filled 2023-11-18: qty 5

## 2023-11-18 MED ORDER — RUBIDIUM RB82 GENERATOR (RUBYFILL)
25.0000 | PACK | Freq: Once | INTRAVENOUS | Status: AC
  Administered 2023-11-18: 24.92 via INTRAVENOUS

## 2023-11-18 MED ORDER — REGADENOSON 0.4 MG/5ML IV SOLN
0.4000 mg | Freq: Once | INTRAVENOUS | Status: AC
  Administered 2023-11-18: 0.4 mg via INTRAVENOUS
  Filled 2023-11-18: qty 5

## 2023-11-18 MED ORDER — RUBIDIUM RB82 GENERATOR (RUBYFILL)
25.0000 | PACK | Freq: Once | INTRAVENOUS | Status: AC
  Administered 2023-11-18: 24.96 via INTRAVENOUS

## 2023-11-18 NOTE — Progress Notes (Signed)
Patient presents for a cardiac PET stress test and tolerated procedure without incident. Patient maintained acceptable vital signs throughout the test and was offered caffeine after test.  Patient ambulated out of department with a steady gait.  

## 2023-11-19 ENCOUNTER — Ambulatory Visit (INDEPENDENT_AMBULATORY_CARE_PROVIDER_SITE_OTHER): Payer: PPO

## 2023-11-19 DIAGNOSIS — D510 Vitamin B12 deficiency anemia due to intrinsic factor deficiency: Secondary | ICD-10-CM | POA: Diagnosis not present

## 2023-11-19 MED ORDER — CYANOCOBALAMIN 1000 MCG/ML IJ SOLN
1000.0000 ug | Freq: Once | INTRAMUSCULAR | Status: AC
  Administered 2023-11-19: 1000 ug via INTRAMUSCULAR

## 2023-11-19 NOTE — Progress Notes (Signed)
Pt was given a B12 injec w/o any complications.

## 2023-11-28 ENCOUNTER — Other Ambulatory Visit: Payer: Self-pay | Admitting: Internal Medicine

## 2023-11-28 DIAGNOSIS — E559 Vitamin D deficiency, unspecified: Secondary | ICD-10-CM

## 2023-11-28 DIAGNOSIS — I10 Essential (primary) hypertension: Secondary | ICD-10-CM

## 2023-12-06 ENCOUNTER — Telehealth: Payer: Self-pay

## 2023-12-06 NOTE — Telephone Encounter (Signed)
Lmom to discuss stress test results. Waiting on a return call.  

## 2023-12-13 ENCOUNTER — Ambulatory Visit: Payer: PPO | Attending: Nurse Practitioner | Admitting: Nurse Practitioner

## 2023-12-13 ENCOUNTER — Encounter: Payer: Self-pay | Admitting: Nurse Practitioner

## 2023-12-13 VITALS — BP 118/70 | HR 80 | Ht 66.0 in | Wt 217.0 lb

## 2023-12-13 DIAGNOSIS — I10 Essential (primary) hypertension: Secondary | ICD-10-CM | POA: Diagnosis not present

## 2023-12-13 DIAGNOSIS — R002 Palpitations: Secondary | ICD-10-CM

## 2023-12-13 DIAGNOSIS — E785 Hyperlipidemia, unspecified: Secondary | ICD-10-CM

## 2023-12-13 DIAGNOSIS — R600 Localized edema: Secondary | ICD-10-CM

## 2023-12-13 DIAGNOSIS — G4733 Obstructive sleep apnea (adult) (pediatric): Secondary | ICD-10-CM

## 2023-12-13 DIAGNOSIS — R0602 Shortness of breath: Secondary | ICD-10-CM | POA: Diagnosis not present

## 2023-12-13 DIAGNOSIS — I25118 Atherosclerotic heart disease of native coronary artery with other forms of angina pectoris: Secondary | ICD-10-CM | POA: Diagnosis not present

## 2023-12-13 MED ORDER — ISOSORBIDE MONONITRATE ER 30 MG PO TB24: 15.0000 mg | ORAL_TABLET | Freq: Every day | ORAL | 3 refills | Status: DC

## 2023-12-13 NOTE — Progress Notes (Signed)
Office Visit    Patient Name: Valerie [name] Date of Encounter: 12/13/2023  Primary Care Provider:  Etta Grandchild, MD Primary Cardiologist:  Nanetta Batty, MD  Chief Complaint    77 year old female with a history of elevated coronary artery calcium score/nonobstructive CAD, palpitations, hypertension, hyperlipidemia, OSA, GERD, and hypothyroidism who presents for follow-up related to CAD and hypertension.   Past Medical History    Past Medical History:  Diagnosis Date   Anemia    Anxiety and depression    Arthritis    Asthma    Cataract    Degenerative arthritis    Depression    Diabetes mellitus, type 2 (HCC)    Fatigue    GERD (gastroesophageal reflux disease)    Glaucoma    Heart palpitations    Hemorrhoids    Hyperlipidemia    Hypertension    Hypothyroidism    Hypoxemia 11/24/2013   Memory deficit 10/04/2013   Obesity    Sleep apnea    BiPAP   Snoring disorder    Past Surgical History:  Procedure Laterality Date   benign tumors resected     CATARACT EXTRACTION Bilateral    FOOT SURGERY Left    bone spur   ROTATOR CUFF REPAIR Left    TUBAL LIGATION      Allergies  Allergies  Allergen Reactions   Food Swelling    bananas   Penicillins Swelling and Rash     Labs/Other Studies Reviewed    The following studies were reviewed today:  Cardiac Studies & Procedures     STRESS TESTS  NM PET CT CARDIAC PERFUSION MULTI W/ABSOLUTE BLOODFLOW 11/18/2023  Narrative   The study is normal. The study is intermediate risk. Study is suggestive of mild microvascular disease.   LV perfusion is normal. There is no evidence of ischemia. There is no evidence of infarction.   End diastolic cavity size is normal. End systolic cavity size is normal.   Myocardial blood flow was computed to be 0.22ml/g/min at rest and 1.46ml/g/min at stress. Global myocardial blood flow reserve was 1.98 and was mildly abnormal.   Coronary calcium was present on the attenuation  correction CT images. Severe coronary calcifications were present. Coronary calcifications were present in the left anterior descending artery, left circumflex artery and right coronary artery distribution(s). Aortic atherosclerosis noted.   Electronically Signed  By: Riley Lam M.D.  CLINICAL DATA:  This over-read does not include interpretation of cardiac or coronary anatomy or pathology. The Cardiac PET CT interpretation by the cardiologist is attached.  COMPARISON:  None Available.  FINDINGS: Cardiovascular: Aortic atherosclerosis. Normal heart size. Three-vessel coronary artery calcifications. No pericardial effusion.  Limited Mediastinum/Nodes: No enlarged mediastinal, hilar, or axillary lymph nodes. Small hiatal hernia. Trachea and esophagus demonstrate no significant findings.  Limited Lungs/Pleura: Mild bibasilar scarring or atelectasis. No pleural effusion or pneumothorax.  Upper Abdomen: No acute abnormality.  Musculoskeletal: No chest wall abnormality. No acute osseous findings.  IMPRESSION: 1. No acute CT findings of the included chest. 2. Coronary artery disease. 3. Small hiatal hernia.  Aortic Atherosclerosis (ICD10-I70.0).   Electronically Signed By: Jearld Lesch M.D. On: 11/18/2023 14:31  ECHOCARDIOGRAM  ECHOCARDIOGRAM COMPLETE 10/03/2018  Narrative *[facility]* [address] [place], [place] [address] [phone]  ------------------------------------------------------------------- Transthoracic Echocardiography  Patient:    Valerie, [name] MR #:       [id number] Study Date: 10/03/2018 Gender:     F Age:  72 Height:     167.6 cm Weight:     114.5 kg BSA:        2.37 m^2 Pt. Status: Room:  ORDERING     Nanetta Batty, MD REFERRING    Nanetta Batty, MD SONOGRAPHER  Clearence Ped, RCS PERFORMING   [facility], Outpatient ATTENDING    Chilton Si,  MD  cc:  ------------------------------------------------------------------- LV EF: 60% -   65%  ------------------------------------------------------------------- Indications:      Chest Pain (R07.89).  ------------------------------------------------------------------- History:   PMH:  OSA.  Dyspnea.  Risk factors:  Hypertension. Diabetes mellitus.  ------------------------------------------------------------------- Study Conclusions  - Left ventricle: The cavity size was normal. Wall thickness was increased in a pattern of mild LVH. Systolic function was normal. The estimated ejection fraction was in the range of 60% to 65%. Wall motion was normal; there were no regional wall motion abnormalities. Doppler parameters are consistent with abnormal left ventricular relaxation (grade 1 diastolic dysfunction). Doppler parameters are consistent with indeterminate ventricular filling pressure. - Aortic valve: Transvalvular velocity was within the normal range. There was no stenosis. There was no regurgitation. - Mitral valve: Transvalvular velocity was within the normal range. There was no evidence for stenosis. There was no regurgitation. - Right ventricle: The cavity size was normal. Wall thickness was normal. Systolic function was normal. - Atrial septum: No defect or patent foramen ovale was identified by color flow Doppler. - Tricuspid valve: There was no regurgitation. - Global longitudinal strain -24.6% (normal).  ------------------------------------------------------------------- Study data:  Comparison was made to the study of 08/29/2015.  Study status:  Routine.  Procedure:  The patient reported no pain pre or post test. Transthoracic echocardiography. Image quality was adequate.          Transthoracic echocardiography.  M-mode, complete 2D, spectral Doppler, and color Doppler.  Birthdate: Patient birthdate: September 21, 1946.  Age:  Patient is 77 yr old.  Sex: Gender:  female.    BMI: 40.8 kg/m^2.  Blood pressure:     131/70 Patient status:  Outpatient.  Study date:  Study date: 10/03/2018. Study time: 10:17 AM.  Location:  [facility]  -------------------------------------------------------------------  ------------------------------------------------------------------- Left ventricle:  The cavity size was normal. Wall thickness was increased in a pattern of mild LVH. Systolic function was normal. The estimated ejection fraction was in the range of 60% to 65%. Wall motion was normal; there were no regional wall motion abnormalities. Doppler parameters are consistent with abnormal left ventricular relaxation (grade 1 diastolic dysfunction). Doppler parameters are consistent with indeterminate ventricular filling pressure.  ------------------------------------------------------------------- Aortic valve:  Poorly visualized.  Trileaflet; normal thickness leaflets. Mobility was not restricted.  Doppler:  Transvalvular velocity was within the normal range. There was no stenosis. There was no regurgitation.  ------------------------------------------------------------------- Aorta:  Aortic root: The aortic root was normal in size.  ------------------------------------------------------------------- Mitral valve:   Structurally normal valve.   Mobility was not restricted.  Doppler:  Transvalvular velocity was within the normal range. There was no evidence for stenosis. There was no regurgitation.    Peak gradient (D): 4 mm Hg.  ------------------------------------------------------------------- Left atrium:  The atrium was normal in size.  ------------------------------------------------------------------- Atrial septum:  No defect or patent foramen ovale was identified by color flow Doppler.  ------------------------------------------------------------------- Right ventricle:  The cavity size was normal. Wall thickness was normal. Systolic  function was normal.  ------------------------------------------------------------------- Pulmonic valve:    Structurally normal valve.   Cusp separation was normal.  Doppler:  Transvalvular velocity was within the normal range.  There was no evidence for stenosis. There was no regurgitation.  ------------------------------------------------------------------- Tricuspid valve:   Structurally normal valve.    Doppler: Transvalvular velocity was within the normal range. There was no regurgitation.  ------------------------------------------------------------------- Pulmonary artery:   The main pulmonary artery was normal-sized. Systolic pressure could not be accurately estimated.  ------------------------------------------------------------------- Right atrium:  The atrium was normal in size.  ------------------------------------------------------------------- Pericardium:  There was no pericardial effusion.  ------------------------------------------------------------------- Systemic veins: Inferior vena cava: The vessel was normal in size. The respirophasic diameter changes were in the normal range (= 50%), consistent with normal central venous pressure. Diameter: 17 mm.  ------------------------------------------------------------------- Measurements  IVC                                   Value        Reference ID                                    17    mm     ----------  Left ventricle                        Value        Reference LV ID, ED, PLAX chordal        (L)    38.5  mm     43 - 52 LV ID, ES, PLAX chordal               28.9  mm     23 - 38 LV fx shortening, PLAX chordal (L)    25    %      >=29 LV PW thickness, ED                   13.5  mm     ---------- IVS/LV PW ratio, ED                   0.78         <=1.3 Stroke volume, 2D                     76    ml     ---------- Stroke volume/bsa, 2D                 32    ml/m^2 ---------- LV e&', lateral                         6.64  cm/s   ---------- LV E/e&', lateral                      14.17        ---------- LV e&', medial                         7.29  cm/s   ---------- LV E/e&', medial                       12.91        ---------- LV e&', average                        6.97  cm/s   ---------- LV E/e&', average  13.51        ---------- Longitudinal strain, TDI              25    %      ----------  Ventricular septum                    Value        Reference IVS thickness, ED                     10.5  mm     ----------  LVOT                                  Value        Reference LVOT ID, S                            19    mm     ---------- LVOT area                             2.84  cm^2   ---------- LVOT peak velocity, S                 110   cm/s   ---------- LVOT mean velocity, S                 75.9  cm/s   ---------- LVOT VTI, S                           26.9  cm     ----------  Aorta                                 Value        Reference Aortic root ID, ED                    29    mm     ----------  Left atrium                           Value        Reference LA ID, A-P, ES                        31    mm     ---------- LA ID/bsa, A-P                        1.31  cm/m^2 <=2.2 LA volume, S                          37.2  ml     ---------- LA volume/bsa, S                      15.7  ml/m^2 ---------- LA volume, ES, 1-p A4C                39    ml     ---------- LA volume/bsa, ES, 1-p A4C            16.5  ml/m^2 ---------- LA volume, ES, 1-p A2C                37.3  ml     ---------- LA volume/bsa, ES, 1-p A2C            15.8  ml/m^2 ----------  Mitral valve                          Value        Reference Mitral E-wave peak velocity           94.1  cm/s   ---------- Mitral A-wave peak velocity           124   cm/s   ---------- Mitral deceleration time              176   ms     150 - 230 Mitral peak gradient, D               4     mm Hg  ---------- Mitral E/A ratio, peak                 0.8          ----------  Right atrium                          Value        Reference RA ID, S-I, ES, A4C                   43.1  mm     34 - 49 RA area, ES, A4C                      13.7  cm^2   8.3 - 19.5 RA volume, ES, A/L                    36.5  ml     ---------- RA volume/bsa, ES, A/L                15.4  ml/m^2 ----------  Systemic veins                        Value        Reference Estimated CVP                         3     mm Hg  ----------  Right ventricle                       Value        Reference TAPSE                                 20.9  mm     ---------- RV s&', lateral, S                     11.3  cm/s   ----------  Legend: (L)  and  (H)  mark values outside specified reference range.  ------------------------------------------------------------------- Prepared and Electronically Authenticated by  Chilton Si, MD 2019-10-21T13:44:17   MONITORS  CARDIAC EVENT MONITOR 04/04/2020  Narrative 1. NSR with occasional PVCs 2. Short atrial runs  CT SCANS  CT CORONARY MORPH W/CTA COR W/SCORE  04/06/2022  Addendum 04/06/2022 12:02 PM ADDENDUM REPORT: 04/06/2022 12:00  EXAM: OVER-READ INTERPRETATION  PET-CT CHEST  The following report is an over-read performed by radiologist Dr. Leatha Gilding [facility] on 04/06/2022. This over-read does not include interpretation of cardiac or coronary anatomy or pathology. The cardiac CT interpretation by the cardiologist is to be attached.  COMPARISON:  CT a chest 08/29/2015  FINDINGS: The visualized lung parenchyma shows no suspicious pulmonary nodule or mass. Calcified granuloma noted left lower lobe. No focal airspace consolidation. No effusion.  No evidence for lymphadenopathy within the visualized mediastinum or hilar regions. Tiny hiatal hernia noted.  No suspicious lytic or sclerotic osseous abnormality.  IMPRESSION: No acute or clinically significant extracardiac  findings.   Electronically Signed By: Kennith Center M.D. On: 04/06/2022 12:00  Narrative CLINICAL DATA:  40F with chest pain  EXAM: Cardiac/Coronary CTA  TECHNIQUE: The patient was scanned on a [employer].  FINDINGS: A 100 kV prospective scan was triggered in the descending thoracic aorta at 111 HU's. Axial non-contrast 3 mm slices were carried out through the heart. The data set was analyzed on a dedicated work station and scored using the Agatson method. Gantry rotation speed was 250 msecs and collimation was .6 mm. No beta blockade and 0.8 mg of sl NTG was given. The 3D data set was reconstructed in 5% intervals of the 67-82 % of the R-R cycle. Diastolic phases were analyzed on a dedicated work station using MPR, MIP and VRT modes. The patient received 80 cc of contrast.  Coronary Arteries:  Normal coronary origin.  Right dominance.  RCA is a large dominant artery that gives rise to PDA and PLA. Calcified plaque in the proximal RCA causes 0-24% stenosis. Calcified plaque in the mid RCA causes 25-49% stenosis.  Left main is a large artery that gives rise to LAD and LCX arteries.  LAD is a large vessel. Calcified plaque in the proximal LAD causes 0-24% stenosis. Calcified plaque in the mid LAD causes 25-49% stenosis. Calcified plaque in the distal LAD causes 25-49% stenosis  LCX is a non-dominant artery that gives rise to one large OM1 branch. Calcified plaque in the proximal LCX causes 0-24% stenosis. Calcified plaque in the mid LCX causes 0-24% stenosis  Calcified plaque in ramus causes 25-49% stenosis  Other findings:  Left Ventricle: Normal size  Left Atrium: Normal size  Pulmonary Veins: Normal configuration  Right Ventricle: Normal size  Right Atrium: Normal size  Cardiac valves: No calcifications  Thoracic aorta: Normal size  Pulmonary Arteries: Normal size  Systemic Veins: Normal drainage  Pericardium: Normal  thickness  IMPRESSION: 1. Coronary calcium score of 454. This was 90th percentile for age and sex matched control.  2.  Normal coronary origin with right dominance.  3.  Nonobstructive CAD  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  PFO  CAD-RADS 2. Mild non-obstructive CAD (25-49%). Consider non-atherosclerotic causes of chest pain. Consider preventive therapy and risk factor modification.  Electronically Signed: By: Epifanio Lesches M.D. On: 04/06/2022 11:26   CT SCANS  CT CARDIAC SCORING (SELF PAY ONLY) 02/12/2022  Addendum 02/12/2022  4:39 PM ADDENDUM REPORT: 02/12/2022 16:37  EXAM: OVER-READ INTERPRETATION  CT CHEST  The following report is an over-read performed by radiologist Dr. Marinda Elk [facility] on 02/12/2022. This over-read does not include interpretation of cardiac or coronary anatomy or pathology. The coronary calcium score interpretation by the cardiologist is attached.  COMPARISON:  CT a of  the chest on 08/29/2015  FINDINGS: Atherosclerosis of the thoracic aorta. Visualized mediastinum and hilar regions demonstrate no lymphadenopathy or masses. Stable small hiatal hernia. Visualized lungs show no evidence of pulmonary edema, consolidation, pneumothorax, nodule or pleural fluid. Visualized upper abdomen and bony structures are unremarkable.  IMPRESSION: 1. Atherosclerosis of the thoracic aorta. 2. Stable small hiatal hernia.   Electronically Signed By: Irish Lack M.D. On: 02/12/2022 16:37  Narrative CLINICAL DATA:  Cardiovascular Disease Risk stratification  EXAM: Coronary Calcium Score  TECHNIQUE: A gated, non-contrast computed tomography scan of the heart was performed using 3mm slice thickness. Axial images were analyzed on a dedicated workstation. Calcium scoring of the coronary arteries was performed using the Agatston method.  FINDINGS: Coronary arteries: Normal origins.  Coronary Calcium  Score:  Left main: 47.6  Left anterior descending artery: 126  Left circumflex artery: 266  Right coronary artery: 60.2  Total: 499  Percentile: 92nd  Pericardium: Normal.  Ascending Aorta: Normal caliber. Atherosclerosis of the ascending and descending aorta.  Non-cardiac: See separate report from [facility].  IMPRESSION: Coronary calcium score of 499. This was 92nd percentile for age-, race-, and sex-matched controls.  RECOMMENDATIONS: Coronary artery calcium (CAC) score is a strong predictor of incident coronary heart disease (CHD) and provides predictive information beyond traditional risk factors. CAC scoring is reasonable to use in the decision to withhold, postpone, or initiate statin therapy in intermediate-risk or selected borderline-risk asymptomatic adults (age 39-75 years and LDL-C >=70 to <190 mg/dL) who do not have diabetes or established atherosclerotic cardiovascular disease (ASCVD).* In intermediate-risk (10-year ASCVD risk >=7.5% to <20%) adults or selected borderline-risk (10-year ASCVD risk >=5% to <7.5%) adults in whom a CAC score is measured for the purpose of making a treatment decision the following recommendations have been made:  If CAC=0, it is reasonable to withhold statin therapy and reassess in 5 to 10 years, as long as higher risk conditions are absent (diabetes mellitus, family history of premature CHD in first degree relatives (males <55 years; females <65 years), cigarette smoking, or LDL >=190 mg/dL).  If CAC is 1 to 99, it is reasonable to initiate statin therapy for patients >=75 years of age.  If CAC is >=100 or >=75th percentile, it is reasonable to initiate statin therapy at any age.  Cardiology referral should be considered for patients with CAC scores >=400 or >=75th percentile.  *2018 AHA/ACC/AACVPR/AAPA/ABC/ACPM/ADA/AGS/APhA/ASPC/NLA/PCNA Guideline on the Management of Blood Cholesterol: A Report of  the American College of Cardiology/American Heart Association Task Force on Clinical Practice Guidelines. J Am Coll Cardiol. 2019;73(24):3168-3209.  Armanda Magic, MD  Electronically Signed: By: Armanda Magic M.D. On: 02/12/2022 12:20         Recent Labs: 02/09/2023: ALT 15; TSH 3.00 08/31/2023: BUN 12; Creatinine, Ser 1.20; Hemoglobin 10.8; Platelets 328; Potassium 4.3; Sodium 143  Recent Lipid Panel    Component Value Date/Time   CHOL 152 05/11/2023 1400   TRIG 253.0 (H) 05/11/2023 1400   HDL 48.90 05/11/2023 1400   CHOLHDL 3 05/11/2023 1400   VLDL 50.6 (H) 05/11/2023 1400   LDLCALC 56 05/05/2022 1356   LDLDIRECT 60.0 05/11/2023 1400    History of Present Illness    77 year old female with the above past medical history including elevated coronary artery calcium score/nonobstructive CAD, palpitations, hypertension, hyperlipidemia, OSA, GERD, and hypothyroidism.   She was initially referred to cardiology in the setting of palpitations.  Echocardiogram and Myoview in 2019 were normal.  Outpatient cardiac monitor in 03/2020 showed sinus  rhythm with PVCs, short atrial runs, no significant arrhythmia.  Coronary CTA in 03/2022 showed coronary calcium score of 454 (90th percentile), nonobstructive CAD.  She saw her PCP on 08/18/2023 in the setting of chest pain.  EKG and troponin were reassuring. She was last seen in the office on 08/31/2023 and was stable from a cardiac standpoint.  She noted a several week history of intermittent sharp chest pain/chest tightness both at rest and with exertion, stable chronic dyspnea.  Cardiac PET stress test showed normal LV perfusion, no evidence of ischemia or infarction, EF 69%, mild microvascular disease.   She presents today for follow-up. Since her last visit she has been stable overall from a cardiac standpoint.  She continues to note intermittent chest discomfort both at rest and with exertion, mild shortness of breath, unchanged from prior visits.  She  has stable nonpitting bilateral lower extremity edema.  She denies any palpitations, dizziness, PND, orthopnea, weight gain.  Home Medications    Current Outpatient Medications  Medication Sig Dispense Refill   albuterol (PROVENTIL) (2.5 MG/3ML) 0.083% nebulizer solution Take 3 mLs (2.5 mg total) by nebulization every 6 (six) hours as needed for wheezing or shortness of breath. 75 mL 3   albuterol (VENTOLIN HFA) 108 (90 Base) MCG/ACT inhaler Inhale 2 puffs into the lungs every 6 (six) hours as needed. For shortness of breath 3 each 4   aspirin 81 MG tablet Take 81 mg by mouth daily.      atorvastatin (LIPITOR) 20 MG tablet TAKE 1 TABLET BY MOUTH EVERY DAY 90 tablet 0   Blood Glucose Calibration (ONETOUCH VERIO) LIQD 1 Act by In Vitro route daily. 1 each 5   Blood Glucose Monitoring Suppl (ONETOUCH VERIO FLEX SYSTEM) DEVI 1 Act by Does not apply route 2 (two) times daily. 1 each 3   budesonide-formoterol (SYMBICORT) 80-4.5 MCG/ACT inhaler Inhale 2 puffs into the lungs 2 (two) times daily as needed. 3 each 1   carvedilol (COREG) 25 MG tablet TAKE 1 TABLET BY MOUTH TWICE A DAY WITH FOOD 180 tablet 0   Cholecalciferol (VITAMIN D3) 50 MCG (2000 UT) capsule TAKE 1 CAPSULE BY MOUTH EVERY DAY 90 capsule 1   clobetasol ointment (TEMOVATE) 0.05 % Apply 1 application topically 2 (two) times daily. 60 g 1   Continuous Blood Gluc Receiver (FREESTYLE LIBRE 2 READER) DEVI 1 Act by Does not apply route daily. 2 each 5   Continuous Blood Gluc Sensor (FREESTYLE LIBRE 2 SENSOR) MISC 1 Act by Does not apply route daily. 2 each 5   famotidine (PEPCID) 40 MG tablet TAKE 1 TABLET BY MOUTH EVERY DAY 90 tablet 1   ferrous sulfate 325 (65 FE) MG tablet TAKE 1 TABLET BY MOUTH 3 TIMES DAILY WITH MEALS. 270 tablet 0   Finerenone (KERENDIA) 20 MG TABS Take 1 tablet (20 mg total) by mouth daily. Via Patient Assistance Health and safety inspector) 90 tablet 1   gabapentin (NEURONTIN) 300 MG capsule TAKE 1 CAPSULE BY MOUTH THREE TIMES A DAY 270  capsule 1   glucose blood (FREESTYLE TEST STRIPS) test strip USE TO TEST BLOOD SUGAR 3 TIMES A DAY. DX: E11.8 300 strip 1   irbesartan (AVAPRO) 300 MG tablet Take 0.5 tablets (150 mg total) by mouth daily. (Patient taking differently: Take 150 mg by mouth daily as needed.) 45 tablet 1   isosorbide mononitrate (IMDUR) 30 MG 24 hr tablet Take 0.5 tablets (15 mg total) by mouth daily. 45 tablet 3   latanoprost (XALATAN) 0.005 %  ophthalmic solution Place 1 drop into both eyes at bedtime.      ONETOUCH VERIO test strip 1 EACH BY OTHER ROUTE 2 (TWO) TIMES DAILY. USE AS INSTRUCTED 200 strip 1   oxyCODONE-acetaminophen (PERCOCET/ROXICET) 5-325 MG tablet Take 1 tablet by mouth every 8 (eight) hours as needed for severe pain. 90 tablet 0   pantoprazole (PROTONIX) 40 MG tablet TAKE 1 TABLET BY MOUTH 2 TIMES DAILY BEFORE A MEAL 180 tablet 0   potassium chloride SA (KLOR-CON M20) 20 MEQ tablet Take 1 tablet (20 mEq total) by mouth daily. 90 tablet 0   SYNJARDY 12.5-500 MG TABS TAKE 1 TABLET BY MOUTH 2 (TWO) TIMES DAILY. TAKE 2 TABLETS DAILY. 180 tablet 1   TRULICITY 1.5 MG/0.5ML SOAJ INJECT 1.5 MG (0.5ML) UNDER THE SKIN ONCE A WEEK 6 mL 0   No current facility-administered medications for this visit.     Review of Systems    She denies palpitations, pnd, orthopnea, n, v, dizziness, syncope, weight gain, or early satiety. All other systems reviewed and are otherwise negative except as noted above.   Physical Exam    VS:  BP 118/70 (BP Location: Left Arm, Patient Position: Sitting, Cuff Size: Large)   Pulse 80   Ht 5\' 6"  (1.676 m)   Wt 217 lb (98.4 kg)   SpO2 98%   BMI 35.02 kg/m   GEN: Well nourished, well developed, in no acute distress. HEENT: normal. Neck: Supple, no JVD, carotid bruits, or masses. Cardiac: RRR, no murmurs, rubs, or gallops. No clubbing, cyanosis, nonpitting bilateral lower extremity edema.  Radials/DP/PT 2+ and equal bilaterally.  Respiratory:  Respirations regular and  unlabored, clear to auscultation bilaterally. GI: Soft, nontender, nondistended, BS + x 4. MS: no deformity or atrophy. Skin: warm and dry, no rash. Neuro:  Strength and sensation are intact. Psych: Normal affect.  Accessory Clinical Findings    ECG personally reviewed by me today -    - no EKG in office today.   Lab Results  Component Value Date   WBC 8.8 08/31/2023   HGB 10.8 (L) 08/31/2023   HCT 33.6 (L) 08/31/2023   MCV 90 08/31/2023   PLT 328 08/31/2023   Lab Results  Component Value Date   CREATININE 1.20 (H) 08/31/2023   BUN 12 08/31/2023   NA 143 08/31/2023   K 4.3 08/31/2023   CL 102 08/31/2023   CO2 25 08/31/2023   Lab Results  Component Value Date   ALT 15 02/09/2023   AST 13 02/09/2023   ALKPHOS 101 02/09/2023   BILITOT 0.4 02/09/2023   Lab Results  Component Value Date   CHOL 152 05/11/2023   HDL 48.90 05/11/2023   LDLCALC 56 05/05/2022   LDLDIRECT 60.0 05/11/2023   TRIG 253.0 (H) 05/11/2023   CHOLHDL 3 05/11/2023    Lab Results  Component Value Date   HGBA1C 6.3 08/18/2023    Assessment & Plan   1. CAD/chest pain/dyspnea on exertion: Coronary CTA in 03/2022 showed coronary calcium score of 454 (90th percentile), nonobstructive CAD. Cardiac PET stress test in 11/2023 showed normal LV perfusion, no evidence of ischemia or infarction, EF 69%, mild microvascular disease.  She continues to note intermittent chest discomfort both at rest and with exertion. Symptoms can last from minutes to hours at a time.  She has stable chronic dyspnea, nonpitting bilateral lower extremity edema.  Will trial Imdur 15 mg daily. Will update echocardiogram in the setting of lower extremity edema, shortness of breath.  Reviewed ED precautions. Continue aspirin, carvedilol, irbesartan and Lipitor.   2. Palpitations: Outpatient cardiac monitor in 03/2020 showed sinus rhythm with PVCs, short atrial runs, no significant arrhythmia. Denies any recent palpitations. Continue  carvedilol.    3. Hypertension: BP well controlled. Continue current antihypertensive regimen.    4. Hyperlipidemia: LDL was 60 in 04/2023.  Continue Lipitor.   5. OSA: Adherent to CPAP.   6. Disposition: Follow-up in 6-8 weeks.       Joylene Grapes, NP 12/13/2023, 5:18 PM

## 2023-12-13 NOTE — Patient Instructions (Signed)
Medication Instructions:  Start Imdur 15 mg daily  *If you need a refill on your cardiac medications before your next appointment, please call your pharmacy*   Lab Work: NONE ordered at this time of appointment     Testing/Procedures: Your physician has requested that you have an echocardiogram. Echocardiography is a painless test that uses sound waves to create images of your heart. It provides your doctor with information about the size and shape of your heart and how well your heart's chambers and valves are working. This procedure takes approximately one hour. There are no restrictions for this procedure. Please do NOT wear cologne, perfume, aftershave, or lotions (deodorant is allowed). Please arrive 15 minutes prior to your appointment time.  Please note: We ask at that you not bring children with you during ultrasound (echo/ vascular) testing. Due to room size and safety concerns, children are not allowed in the ultrasound rooms during exams. Our front office staff cannot provide observation of children in our lobby area while testing is being conducted. An adult accompanying a patient to their appointment will only be allowed in the ultrasound room at the discretion of the ultrasound technician under special circumstances. We apologize for any inconvenience.    Follow-Up: At [facility], you and your health needs are our priority.  As part of our continuing mission to provide you with exceptional heart care, we have created designated Provider Care Teams.  These Care Teams include your primary Cardiologist (physician) and Advanced Practice Providers (APPs -  Physician Assistants and Nurse Practitioners) who all work together to provide you with the care you need, when you need it.  We recommend signing up for the patient portal called "MyChart".  Sign up information is provided on this After Visit Summary.  MyChart is used to connect with patients for Virtual Visits  (Telemedicine).  Patients are able to view lab/test results, encounter notes, upcoming appointments, etc.  Non-urgent messages can be sent to your provider as well.   To learn more about what you can do with MyChart, go to [email or web address].    Your next appointment:   6-8 week(s)  Provider:   Bernadene Person, NP

## 2023-12-20 ENCOUNTER — Ambulatory Visit: Payer: PPO

## 2023-12-21 ENCOUNTER — Encounter: Payer: Self-pay | Admitting: Internal Medicine

## 2023-12-21 ENCOUNTER — Ambulatory Visit: Payer: PPO | Admitting: Internal Medicine

## 2023-12-21 VITALS — BP 126/86 | HR 76 | Temp 97.8°F | Ht 66.0 in | Wt 218.8 lb

## 2023-12-21 DIAGNOSIS — E876 Hypokalemia: Secondary | ICD-10-CM

## 2023-12-21 DIAGNOSIS — D510 Vitamin B12 deficiency anemia due to intrinsic factor deficiency: Secondary | ICD-10-CM | POA: Diagnosis not present

## 2023-12-21 DIAGNOSIS — M48061 Spinal stenosis, lumbar region without neurogenic claudication: Secondary | ICD-10-CM

## 2023-12-21 DIAGNOSIS — M150 Primary generalized (osteo)arthritis: Secondary | ICD-10-CM

## 2023-12-21 DIAGNOSIS — Z800 Family history of malignant neoplasm of digestive organs: Secondary | ICD-10-CM | POA: Insufficient documentation

## 2023-12-21 DIAGNOSIS — K625 Hemorrhage of anus and rectum: Secondary | ICD-10-CM | POA: Insufficient documentation

## 2023-12-21 DIAGNOSIS — E785 Hyperlipidemia, unspecified: Secondary | ICD-10-CM | POA: Diagnosis not present

## 2023-12-21 DIAGNOSIS — E118 Type 2 diabetes mellitus with unspecified complications: Secondary | ICD-10-CM

## 2023-12-21 DIAGNOSIS — I10 Essential (primary) hypertension: Secondary | ICD-10-CM | POA: Diagnosis not present

## 2023-12-21 LAB — CBC WITH DIFFERENTIAL/PLATELET
Basophils Absolute: 0 10*3/uL (ref 0.0–0.1)
Basophils Relative: 0.6 % (ref 0.0–3.0)
Eosinophils Absolute: 0.3 10*3/uL (ref 0.0–0.7)
Eosinophils Relative: 4.2 % (ref 0.0–5.0)
HCT: 34.6 % — ABNORMAL LOW (ref 36.0–46.0)
Hemoglobin: 11.2 g/dL — ABNORMAL LOW (ref 12.0–15.0)
Lymphocytes Relative: 26.9 % (ref 12.0–46.0)
Lymphs Abs: 2.2 10*3/uL (ref 0.7–4.0)
MCHC: 32.5 g/dL (ref 30.0–36.0)
MCV: 88.7 fL (ref 78.0–100.0)
Monocytes Absolute: 0.7 10*3/uL (ref 0.1–1.0)
Monocytes Relative: 8.8 % (ref 3.0–12.0)
Neutro Abs: 4.8 10*3/uL (ref 1.4–7.7)
Neutrophils Relative %: 59.5 % (ref 43.0–77.0)
Platelets: 320 10*3/uL (ref 150.0–400.0)
RBC: 3.91 Mil/uL (ref 3.87–5.11)
RDW: 15.1 % (ref 11.5–15.5)
WBC: 8.1 10*3/uL (ref 4.0–10.5)

## 2023-12-21 LAB — HEMOGLOBIN A1C: Hgb A1c MFr Bld: 6.4 % (ref 4.6–6.5)

## 2023-12-21 LAB — BASIC METABOLIC PANEL
BUN: 11 mg/dL (ref 6–23)
CO2: 30 meq/L (ref 19–32)
Calcium: 10 mg/dL (ref 8.4–10.5)
Chloride: 99 meq/L (ref 96–112)
Creatinine, Ser: 1.08 mg/dL (ref 0.40–1.20)
GFR: 50.15 mL/min — ABNORMAL LOW (ref >60.00)
Glucose, Bld: 102 mg/dL — ABNORMAL HIGH (ref 70–99)
Potassium: 4.4 meq/L (ref 3.5–5.1)
Sodium: 138 meq/L (ref 135–145)

## 2023-12-21 LAB — HEPATIC FUNCTION PANEL
ALT: 15 U/L (ref 0–35)
AST: 17 U/L (ref 0–37)
Albumin: 4.5 g/dL (ref 3.5–5.2)
Alkaline Phosphatase: 112 U/L (ref 39–117)
Bilirubin, Direct: 0.1 mg/dL (ref 0.0–0.3)
Total Bilirubin: 0.4 mg/dL (ref 0.2–1.2)
Total Protein: 7.8 g/dL (ref 6.0–8.3)

## 2023-12-21 MED ORDER — CYANOCOBALAMIN 1000 MCG/ML IJ SOLN
1000.0000 ug | Freq: Once | INTRAMUSCULAR | Status: AC
  Administered 2023-12-21: 1000 ug via INTRAMUSCULAR

## 2023-12-21 MED ORDER — ATORVASTATIN CALCIUM 20 MG PO TABS: 20.0000 mg | ORAL_TABLET | Freq: Every day | ORAL | 0 refills | Status: DC

## 2023-12-21 MED ORDER — OXYCODONE-ACETAMINOPHEN 5-325 MG PO TABS: 1.0000 | ORAL_TABLET | Freq: Three times a day (TID) | ORAL | 0 refills | Status: DC | PRN

## 2023-12-21 NOTE — Patient Instructions (Signed)

## 2023-12-21 NOTE — Progress Notes (Signed)
 Subjective:  Patient ID: Valerie [name], female    DOB: [birth year]  Age: 78 y.o. MRN: [id number]  CC: Anemia, Hypertension, and Diabetes   HPI Valerie [name] presents for f/up ---  Discussed the use of AI scribe software for clinical note transcription with the patient, who gave verbal consent to proceed.  History of Present Illness   The patient, with a history of hypertension and heart disease, presents with ongoing chest pain despite recent cardiac evaluations. She reports feeling lightheaded, particularly when moving around. The patient has been seeing a cardiologist for these symptoms and is scheduled for an echocardiogram in the coming weeks. She has previously undergone a stress test and another unspecified cardiac test.  The patient also reports unintentional weight loss due to decreased appetite. She denies any symptoms of hyperglycemia such as excessive thirst or urination.  In addition to these symptoms, the patient has been experiencing dizziness and weakness, which she attributes to low blood pressure. She reports that her diuretic medication, chlorthalidone , was recently discontinued due to low blood pressure.        Outpatient Medications Prior to Visit  Medication Sig Dispense Refill   albuterol  (PROVENTIL ) (2.5 MG/3ML) 0.083% nebulizer solution Take 3 mLs (2.5 mg total) by nebulization every 6 (six) hours as needed for wheezing or shortness of breath. 75 mL 3   albuterol  (VENTOLIN  HFA) 108 (90 Base) MCG/ACT inhaler Inhale 2 puffs into the lungs every 6 (six) hours as needed. For shortness of breath 3 each 4   aspirin  81 MG tablet Take 81 mg by mouth daily.      Blood Glucose Calibration (ONETOUCH VERIO) LIQD 1 Act by In Vitro route daily. 1 each 5   Blood Glucose Monitoring Suppl (ONETOUCH VERIO FLEX SYSTEM) DEVI 1 Act by Does not apply route 2 (two) times daily. 1 each 3   budesonide -formoterol  (SYMBICORT ) 80-4.5 MCG/ACT inhaler Inhale 2 puffs into the lungs 2 (two)  times daily as needed. 3 each 1   carvedilol  (COREG ) 25 MG tablet TAKE 1 TABLET BY MOUTH TWICE A DAY WITH FOOD 180 tablet 0   Cholecalciferol  (VITAMIN D3) 50 MCG (2000 UT) capsule TAKE 1 CAPSULE BY MOUTH EVERY DAY 90 capsule 1   clobetasol  ointment (TEMOVATE ) 0.05 % Apply 1 application topically 2 (two) times daily. 60 g 1   Continuous Blood Gluc Receiver (FREESTYLE LIBRE 2 READER) DEVI 1 Act by Does not apply route daily. 2 each 5   Continuous Blood Gluc Sensor (FREESTYLE LIBRE 2 SENSOR) MISC 1 Act by Does not apply route daily. 2 each 5   ferrous sulfate  325 (65 FE) MG tablet TAKE 1 TABLET BY MOUTH 3 TIMES DAILY WITH MEALS. 270 tablet 0   Finerenone  (KERENDIA ) 20 MG TABS Take 1 tablet (20 mg total) by mouth daily. Via Patient Assistance Health And Safety Inspector) 90 tablet 1   gabapentin  (NEURONTIN ) 300 MG capsule TAKE 1 CAPSULE BY MOUTH THREE TIMES A DAY 270 capsule 1   glucose blood (FREESTYLE TEST STRIPS) test strip USE TO TEST BLOOD SUGAR 3 TIMES A DAY. DX: E11.8 300 strip 1   irbesartan  (AVAPRO ) 300 MG tablet Take 0.5 tablets (150 mg total) by mouth daily. (Patient taking differently: Take 150 mg by mouth daily as needed.) 45 tablet 1   isosorbide  mononitrate (IMDUR ) 30 MG 24 hr tablet Take 0.5 tablets (15 mg total) by mouth daily. 45 tablet 3   latanoprost  (XALATAN ) 0.005 % ophthalmic solution Place 1 drop into both eyes at bedtime.  ONETOUCH VERIO test strip 1 EACH BY OTHER ROUTE 2 (TWO) TIMES DAILY. USE AS INSTRUCTED 200 strip 1   pantoprazole  (PROTONIX ) 40 MG tablet TAKE 1 TABLET BY MOUTH 2 TIMES DAILY BEFORE A MEAL 180 tablet 0   SYNJARDY  12.5-500 MG TABS TAKE 1 TABLET BY MOUTH 2 (TWO) TIMES DAILY. TAKE 2 TABLETS DAILY. 180 tablet 1   TRULICITY  1.5 MG/0.5ML SOAJ INJECT 1.5 MG (0.5ML) UNDER THE SKIN ONCE A WEEK 6 mL 0   atorvastatin  (LIPITOR) 20 MG tablet TAKE 1 TABLET BY MOUTH EVERY DAY 90 tablet 0   famotidine  (PEPCID ) 40 MG tablet TAKE 1 TABLET BY MOUTH EVERY DAY 90 tablet 1    oxyCODONE -acetaminophen  (PERCOCET/ROXICET) 5-325 MG tablet Take 1 tablet by mouth every 8 (eight) hours as needed for severe pain. 90 tablet 0   potassium chloride  SA (KLOR-CON  M20) 20 MEQ tablet Take 1 tablet (20 mEq total) by mouth daily. 90 tablet 0   No facility-administered medications prior to visit.    ROS Review of Systems  Constitutional:  Positive for fatigue. Negative for chills, diaphoresis, fever and unexpected weight change.  HENT: Negative.    Eyes:  Negative for visual disturbance.  Respiratory:  Negative for cough, chest tightness, shortness of breath and wheezing.   Cardiovascular:  Positive for chest pain. Negative for leg swelling.  Genitourinary: Negative.  Negative for decreased urine volume, difficulty urinating and dysuria.  Musculoskeletal:  Positive for back pain and gait problem. Negative for arthralgias, joint swelling and myalgias.  Neurological:  Positive for dizziness and light-headedness. Negative for weakness.  Hematological:  Negative for adenopathy. Does not bruise/bleed easily.  Psychiatric/Behavioral:  Positive for confusion and decreased concentration.     Objective:  BP 126/86 (BP Location: Left Arm, Patient Position: Sitting, Cuff Size: Normal)   Pulse 76   Temp 97.8 F (36.6 C) (Oral)   Ht 5' 6 (1.676 m)   Wt 218 lb 12.8 oz (99.2 kg)   SpO2 97%   BMI 35.32 kg/m   BP Readings from Last 3 Encounters:  12/21/23 126/86  12/13/23 118/70  11/18/23 (!) 119/52    Wt Readings from Last 3 Encounters:  12/21/23 218 lb 12.8 oz (99.2 kg)  12/13/23 217 lb (98.4 kg)  08/31/23 222 lb 9.6 oz (101 kg)    Physical Exam Vitals reviewed.  Constitutional:      Appearance: Normal appearance. She is not ill-appearing.  HENT:     Nose: Nose normal.     Mouth/Throat:     Mouth: Mucous membranes are moist.  Eyes:     General: No scleral icterus.    Conjunctiva/sclera: Conjunctivae normal.  Cardiovascular:     Rate and Rhythm: Normal rate and  regular rhythm.     Heart sounds: No murmur heard. Pulmonary:     Effort: Pulmonary effort is normal.     Breath sounds: No stridor. No wheezing, rhonchi or rales.  Abdominal:     General: Abdomen is protuberant. There is no distension.     Palpations: Abdomen is soft. There is no hepatomegaly, splenomegaly or mass.     Tenderness: There is no abdominal tenderness.  Musculoskeletal:        General: Normal range of motion.     Cervical back: Neck supple.     Right lower leg: No edema.     Left lower leg: No edema.  Lymphadenopathy:     Cervical: No cervical adenopathy.  Skin:    General: Skin is warm and dry.  Neurological:     General: No focal deficit present.     Mental Status: She is alert. Mental status is at baseline.  Psychiatric:        Mood and Affect: Mood normal.        Behavior: Behavior normal.     Lab Results  Component Value Date   WBC 8.1 12/21/2023   HGB 11.2 (L) 12/21/2023   HCT 34.6 (L) 12/21/2023   PLT 320.0 12/21/2023   GLUCOSE 102 (H) 12/21/2023   CHOL 152 05/11/2023   TRIG 253.0 (H) 05/11/2023   HDL 48.90 05/11/2023   LDLDIRECT 60.0 05/11/2023   LDLCALC 56 05/05/2022   ALT 15 12/21/2023   AST 17 12/21/2023   NA 138 12/21/2023   K 4.4 12/21/2023   CL 99 12/21/2023   CREATININE 1.08 12/21/2023   BUN 11 12/21/2023   CO2 30 12/21/2023   TSH 3.00 02/09/2023   INR 1.1 (H) 04/10/2021   HGBA1C 6.4 12/21/2023   MICROALBUR 0.9 08/18/2023    NM PET CT CARDIAC PERFUSION MULTI W/ABSOLUTE BLOODFLOW Result Date: 11/18/2023   The study is normal. The study is intermediate risk. Study is suggestive of mild microvascular disease.   LV perfusion is normal. There is no evidence of ischemia. There is no evidence of infarction.   End diastolic cavity size is normal. End systolic cavity size is normal.   Myocardial blood flow was computed to be 0.16ml/g/min at rest and 1.90ml/g/min at stress. Global myocardial blood flow reserve was 1.98 and was mildly abnormal.    Coronary calcium  was present on the attenuation correction CT images. Severe coronary calcifications were present. Coronary calcifications were present in the left anterior descending artery, left circumflex artery and right coronary artery distribution(s). Aortic atherosclerosis noted.   Electronically Signed  By: Stanly Leavens M.D. CLINICAL DATA:  This over-read does not include interpretation of cardiac or coronary anatomy or pathology. The Cardiac PET CT interpretation by the cardiologist is attached. COMPARISON:  None Available. FINDINGS: Cardiovascular: Aortic atherosclerosis. Normal heart size. Three-vessel coronary artery calcifications. No pericardial effusion. Limited Mediastinum/Nodes: No enlarged mediastinal, hilar, or axillary lymph nodes. Small hiatal hernia. Trachea and esophagus demonstrate no significant findings. Limited Lungs/Pleura: Mild bibasilar scarring or atelectasis. No pleural effusion or pneumothorax. Upper Abdomen: No acute abnormality. Musculoskeletal: No chest wall abnormality. No acute osseous findings. IMPRESSION: 1. No acute CT findings of the included chest. 2. Coronary artery disease. 3. Small hiatal hernia. Aortic Atherosclerosis (ICD10-I70.0). Electronically Signed   By: Marolyn JONETTA Jaksch M.D.   On: 11/18/2023 14:31   Assessment & Plan:  Vitamin B12 deficiency anemia due to intrinsic factor deficiency -     Cyanocobalamin  -     CBC with Differential/Platelet; Future  Essential hypertension, benign- Her blood pressure is overcontrolled.  I have asked her not to restart the thiazide diuretic. -     CBC with Differential/Platelet; Future -     Hepatic function panel; Future  Hypokalemia -     Basic metabolic panel; Future  Type II diabetes mellitus with manifestations (HCC) -     Basic metabolic panel; Future -     Hemoglobin A1c; Future  Hyperlipidemia with target LDL less than 100 - LDL goal achieved. Doing well on the statin  -     Hepatic function panel;  Future -     Atorvastatin  Calcium ; Take 1 tablet (20 mg total) by mouth daily.  Dispense: 90 tablet; Refill: 0  Spinal stenosis of lumbar 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 -  oxyCODONE -Acetaminophen ; Take 1 tablet by mouth every 8 (eight) hours as needed for severe pain (pain score 7-10).  Dispense: 90 tablet; Refill: 0  Primary osteoarthritis involving multiple joints -     oxyCODONE -Acetaminophen ; Take 1 tablet by mouth every 8 (eight) hours as needed for severe pain (pain score 7-10).  Dispense: 90 tablet; Refill: 0     Follow-up: Return in about 4 months (around 04/19/2024).  Debby Molt, MD

## 2023-12-22 ENCOUNTER — Other Ambulatory Visit: Payer: Self-pay

## 2023-12-22 DIAGNOSIS — M48061 Spinal stenosis, lumbar region without neurogenic claudication: Secondary | ICD-10-CM

## 2023-12-22 DIAGNOSIS — M150 Primary generalized (osteo)arthritis: Secondary | ICD-10-CM

## 2023-12-22 NOTE — Telephone Encounter (Signed)
 Copied from CRM [id number]. Topic: Clinical - Medication Refill >> Dec 22, 2023  3:09 PM Victoria A wrote: Most Recent Primary Care Visit:  Provider: JOSHUA DEBBY CROME  Department: [facility]  Visit Type: OFFICE VISIT  Date: 12/21/2023  Medication: oxyCODONE -acetaminophen  (PERCOCET/ROXICET) 5-325 MG tablet;  Patient requests for office to refill medication  Has the patient contacted their pharmacy? Yes (Agent: If no, request that the patient contact the pharmacy for the refill. If patient does not wish to contact the pharmacy document the reason why and proceed with request.) (Agent: If yes, when and what did the pharmacy advise?)  Is this the correct pharmacy for this prescription? Yes If no, delete pharmacy and type the correct one.  This is the patient's preferred pharmacy:  [facility] [address] [place] - [address] [place] [address] Phone: [phone] Fax: [phone]  [id number] - [address]), [place] - [address] [place]) [place] [address] Phone: [phone] Fax: [phone]  [facility] [place], [place] - [address] [place] [address] Phone: [phone] Fax: [phone]   Has the prescription been filled recently? No  Is the patient out of the medication? Yes  Has the patient been seen for an appointment in the last year OR does the patient have an upcoming appointment? Yes  Can we respond through MyChart? No  Agent: Please be advised that Rx refills may take up to 3 business days. We ask that you follow-up with your pharmacy.

## 2023-12-24 ENCOUNTER — Other Ambulatory Visit: Payer: Self-pay | Admitting: Internal Medicine

## 2023-12-24 DIAGNOSIS — K2100 Gastro-esophageal reflux disease with esophagitis, without bleeding: Secondary | ICD-10-CM

## 2023-12-27 MED ORDER — OXYCODONE-ACETAMINOPHEN 5-325 MG PO TABS: 1.0000 | ORAL_TABLET | Freq: Three times a day (TID) | ORAL | 0 refills | Status: DC | PRN

## 2023-12-27 NOTE — Telephone Encounter (Signed)
 Controlled, unable to deny. Looks like was already sent 12/21/23

## 2023-12-30 DIAGNOSIS — G479 Sleep disorder, unspecified: Secondary | ICD-10-CM | POA: Diagnosis not present

## 2023-12-30 DIAGNOSIS — G4733 Obstructive sleep apnea (adult) (pediatric): Secondary | ICD-10-CM | POA: Diagnosis not present

## 2023-12-31 ENCOUNTER — Telehealth: Payer: Self-pay

## 2023-12-31 NOTE — Telephone Encounter (Signed)
Called patient to inform her that if she wanted to continue using this program then she would have to do another application for the year 2025. I was unable to reach the patient, left a message to return out call at her earliest convenience.

## 2024-01-03 ENCOUNTER — Other Ambulatory Visit: Payer: Self-pay

## 2024-01-03 ENCOUNTER — Telehealth: Payer: Self-pay | Admitting: Internal Medicine

## 2024-01-03 DIAGNOSIS — M48061 Spinal stenosis, lumbar region without neurogenic claudication: Secondary | ICD-10-CM

## 2024-01-03 DIAGNOSIS — M150 Primary generalized (osteo)arthritis: Secondary | ICD-10-CM

## 2024-01-03 MED ORDER — OXYCODONE-ACETAMINOPHEN 5-325 MG PO TABS: 1.0000 | ORAL_TABLET | Freq: Three times a day (TID) | ORAL | 0 refills | Status: DC | PRN

## 2024-01-03 NOTE — Telephone Encounter (Unsigned)
Copied from CRM [id number]. Topic: Clinical - Medication Question >> Jan 03, 2024 10:40 AM Hector Shade B wrote: Reason for CRM: patient called to check the status of a prescription she's called in regards to on the 8th of January for Percocet advised patient it did show that the refill request was sent in on the 13th of Jan. To call pharmacy and check the status there. Patient has requested a call back

## 2024-01-03 NOTE — Telephone Encounter (Signed)
Patient needs a PA done on her oxycodone due to her having new insurance. Please advise.

## 2024-01-05 ENCOUNTER — Telehealth: Payer: Self-pay

## 2024-01-05 ENCOUNTER — Other Ambulatory Visit (HOSPITAL_COMMUNITY): Payer: Self-pay

## 2024-01-05 NOTE — Telephone Encounter (Signed)
Pharmacy Patient Advocate Encounter   Received notification from Pt Calls Messages that prior authorization for oxyCODONE-Acetaminophen 5-325MG  tablets is required/requested.   Insurance verification completed.   The patient is insured through [facility] ADVANT[employer]/RX ADVANCE .   Per test claim: PA required and submitted KEY/EOC/Request #: [id number] due to: Prior Authorization not required for patient/medication  Per test claim: Refill too soon. PA is not needed at this time. Medication was filled 01/04/24. Next eligible fill date is 01/10/24.

## 2024-01-05 NOTE — Telephone Encounter (Signed)
Noted  

## 2024-01-05 NOTE — Telephone Encounter (Signed)
PA request has been Cancelled. New Encounter created for follow up. For additional info see Pharmacy Prior Auth telephone encounter from 01/05/24.

## 2024-01-12 ENCOUNTER — Ambulatory Visit (HOSPITAL_COMMUNITY)
Admission: RE | Admit: 2024-01-12 | Discharge: 2024-01-12 | Disposition: A | Discharge status: Home / Self Care | Payer: PPO | Source: Ambulatory Visit | Attending: Nurse Practitioner | Admitting: Nurse Practitioner

## 2024-01-12 DIAGNOSIS — R0602 Shortness of breath: Secondary | ICD-10-CM | POA: Insufficient documentation

## 2024-01-12 DIAGNOSIS — R600 Localized edema: Secondary | ICD-10-CM | POA: Diagnosis not present

## 2024-01-12 LAB — ECHOCARDIOGRAM COMPLETE
AR max vel: 2.39 cm2
AV Area VTI: 2.44 cm2
AV Area mean vel: 2.32 cm2
AV Mean grad: 3 mm[Hg]
AV Peak grad: 4.8 mm[Hg]
Ao pk vel: 1.1 m/s
Area-P 1/2: 3.27 cm2
MV M vel: 0.8 m/s
MV Peak grad: 2.6 mm[Hg]
S' Lateral: 2.59 cm

## 2024-01-13 ENCOUNTER — Other Ambulatory Visit: Payer: Self-pay | Admitting: Internal Medicine

## 2024-01-24 ENCOUNTER — Telehealth: Payer: Self-pay | Admitting: Neurology

## 2024-01-24 ENCOUNTER — Ambulatory Visit (INDEPENDENT_AMBULATORY_CARE_PROVIDER_SITE_OTHER): Payer: PPO

## 2024-01-24 DIAGNOSIS — E538 Deficiency of other specified B group vitamins: Secondary | ICD-10-CM | POA: Diagnosis not present

## 2024-01-24 MED ORDER — CYANOCOBALAMIN 1000 MCG/ML IJ SOLN
1000.0000 ug | Freq: Once | INTRAMUSCULAR | Status: AC
  Administered 2024-01-24: 1000 ug via INTRAMUSCULAR

## 2024-01-24 NOTE — Progress Notes (Signed)
After obtaining consent, and per orders of Dr. Jones, injection of B12 was given by [place] Rylander P Lior Hoen. Patient instructed to report any adverse reaction to me immediately.  

## 2024-01-24 NOTE — Telephone Encounter (Signed)
 Appointment details confirmed

## 2024-01-25 NOTE — Patient Instructions (Incomplete)
Please continue using your BiPAP regularly. While your insurance requires that you use BiPAP at least 4 hours each night on 70% of the nights, I recommend, that you not skip any nights and use it throughout the night if you can. Getting used to BiPAP and staying with the treatment long term does take time and patience and discipline. Untreated obstructive sleep apnea when it is moderate to severe can have an adverse impact on cardiovascular health and raise her risk for heart disease, arrhythmias, hypertension, congestive heart failure, stroke and diabetes. Untreated obstructive sleep apnea causes sleep disruption, nonrestorative sleep, and sleep deprivation. This can have an impact on your day to day functioning and cause daytime sleepiness and impairment of cognitive function, memory loss, mood disturbance, and problems focussing. Using BiPAP regularly can improve these symptoms.  We will update supply orders, today. You are eligible for a new machine. I will order a repeat sleep study that you will do at home. Please listen out for a call from our sleep lab staff to schedule. Once you complete study, our sleep doctors with evaluate the data and we will use that to order a new machine as indicated. This process could take a few weeks. Once new machine is ordered, you will hear back from your DME company to schedule set up of your new machine.   We will need to see you back within 61-90 days following set up of your new BiPAP to document compliance as required by your insurance company. Please call the office to schedule follow up when you receive your new machine. Please feel free to reach out with any questions or concerns.   Try melatonin with or without L theonine for sleep aid.

## 2024-01-25 NOTE — Progress Notes (Unsigned)
PATIENT: Valerie [name] DOB: April 05, 1946  REASON FOR VISIT: follow up HISTORY FROM: patient  No chief complaint on file.    HISTORY OF PRESENT ILLNESS:  01/25/24 ALL:  Ted returns for follow up for OSA on BiPAP.   MOCA was 28/30 at last visit with Dr Vickey Huger 01/2023.     01/26/2023 CD: Valerie [name] is a 78 y.o. female patient who is here for revisit 01/26/2023 for  BiPAP compliance and for memory concerns.  Chief concern according to patient :  ' I don't remember like I used to ".   Today is 26 January 2023 and I reviewed the patient's BiPAP compliance she is using 13 over 9 cm water pressure with a residual AHI of only 0.5 but with high air leaks.  She has only used her BiPAP 16 out of the last 30 days with a compliance of only 53%.  The average user time on days used is 9 hours 30 minutes the average user time on all days is 4 hours 55 minutes.  So for this last 30-day.  Her compliance has been poor she reports she had dental and she could not and did use the interface for a while -it should now be healing and hopefully she can resume.   The patient had repeatedly mentioned that she has memory difficulties and I have asked her today to do a [place] cognitive assessment with my Engineer, site.  The patient did not pass the Trail Making Test, she did draw a clock face correctly,  she copied the cube but is mirror image and not identical image.   She named all animals correctly, she did have good recall for 4 out of 5 words.   She repeated sentences correctly and could do a serial 7 subtraction.   Based on her score of 27 out of 30 I would consider her memory not impaired for age.   I asked her about possible depression: 6/ 15 points on GDS.  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 Another son has moved in as well- and has changed jobs to a lesser paid job- still -all  contribute financially to the household,  G-Daughter goes shopping.   08/12/2022 ALL:  Valerie [name] returns for follow up for OSA on BiPAP. She is doing well on therapy. She is using her machine every night for about 6-7 hours. She denies concerns with machine or supplies.   She was seen by Dr Vickey Huger 04/2022 for dizziness. She was diagnosed with vertigo. She reports that she participated in PT but it was not helpful. She continues to have episodes of dizziness. She feels that the room is spinning when she turns her head.or if she leans forward. She reports a fall about 2 weeks ago. She was sitting in a chair and when she stood up, she got really dizzy then fell on her side. She landed on her sofa. No injuries. She reports BP can be anywhere from 120/70-170-180/90. It fluctuates daily. CBGs are usually 150's. Last A1C 6.3.   She reports that Dr Vickey Huger mentioned to her that we would perform a MOCA at today's visit. I am unable to locate any specific information regarding her memory in the previous note. She states that she can be forgetful at times but no specific memory loss concerns. She manages her home and finances without difficulty. She drives. She  is able to perform ADLs and manage medications independently. Her brother has dementia.     08/14/2021 ALL: [name] is a 78 y.o. female here today for follow up for OSA on BiPAP.  She continues to do well on BiPAP therapy. She is using her machine every night. She denies concerns with machine or supplies. She is resting well. She is followed closely by PCP.     HISTORY: (copied from Dr Dohmeier's previous note)  Interval history from 08-14-2020: Valerie [name], a 78 year old AAF , recently widowed, reports that this year has been very hard for her. She lived with her handicapped son alone, but now her 92 year old granddaughter has joined.  Her husband died with ESRD, hemodialysis, gallstones, Sepsis, and many ,many hospitalizations. He died  of ischemic bowel disease. He was a full code. 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 BMI 39.87 kg/m2. Epworth score is 4 points today and her download shows high compliance: 100%  By days and time of use. 10 hours on average, 13/9 cm water. AHI : 0.0/h. High air leaks.    REVIEW OF SYSTEMS: Out of a complete 14 system review of symptoms, the patient complains only of the following symptoms, fatigue, intermittent lightheadedness/dizziness, forgetfulness and all other reviewed systems are negative.  ESS: 2/24, previously 3/24  ALLERGIES: Allergies  Allergen Reactions   Food Swelling    bananas   Penicillins Swelling and Rash    HOME MEDICATIONS: Outpatient Medications Prior to Visit  Medication Sig Dispense Refill   albuterol (PROVENTIL) (2.5 MG/3ML) 0.083% nebulizer solution Take 3 mLs (2.5 mg total) by nebulization every 6 (six) hours as needed for wheezing or shortness of breath. 75 mL 3   albuterol (VENTOLIN HFA) 108 (90 Base) MCG/ACT inhaler Inhale 2 puffs into the lungs every 6 (six) hours as needed. For shortness of breath 3 each 4   aspirin 81 MG tablet Take 81 mg by mouth daily.      atorvastatin (LIPITOR) 20 MG tablet Take 1 tablet (20 mg total) by mouth daily. 90 tablet 0   Blood Glucose Calibration (ONETOUCH VERIO) LIQD 1 Act by In Vitro route daily. 1 each 5   Blood Glucose Monitoring Suppl (ONETOUCH VERIO FLEX SYSTEM) DEVI 1 Act by Does not apply route 2 (two) times daily. 1 each 3   budesonide-formoterol (SYMBICORT) 80-4.5 MCG/ACT inhaler Inhale 2 puffs into the lungs 2 (two) times daily as needed. 3 each 1   carvedilol (COREG) 25 MG tablet TAKE 1 TABLET BY MOUTH TWICE A DAY WITH FOOD 180 tablet 0   Cholecalciferol (VITAMIN D3) 50 MCG (2000 UT) capsule TAKE 1 CAPSULE BY MOUTH EVERY DAY 90 capsule 1   clobetasol ointment (TEMOVATE) 0.05 % Apply 1 application topically 2 (two) times daily. 60 g 1   Continuous Blood Gluc Receiver (FREESTYLE  LIBRE 2 READER) DEVI 1 Act by Does not apply route daily. 2 each 5   Continuous Blood Gluc Sensor (FREESTYLE LIBRE 2 SENSOR) MISC 1 Act by Does not apply route daily. 2 each 5   famotidine (PEPCID) 40 MG tablet TAKE 1 TABLET BY MOUTH EVERY DAY 90 tablet 1   ferrous sulfate 325 (65 FE) MG tablet TAKE 1 TABLET BY MOUTH 3 TIMES DAILY WITH MEALS. 270 tablet 0   Finerenone (KERENDIA) 20 MG TABS Take 1 tablet (20 mg total) by mouth daily. Via Patient Assistance (Bayer) 90 tablet 1   gabapentin (  NEURONTIN) 300 MG capsule TAKE 1 CAPSULE BY MOUTH THREE TIMES A DAY 270 capsule 1   glucose blood (FREESTYLE TEST STRIPS) test strip USE TO TEST BLOOD SUGAR 3 TIMES A DAY. DX: E11.8 300 strip 1   irbesartan (AVAPRO) 300 MG tablet Take 0.5 tablets (150 mg total) by mouth daily. (Patient taking differently: Take 150 mg by mouth daily as needed.) 45 tablet 1   isosorbide mononitrate (IMDUR) 30 MG 24 hr tablet Take 0.5 tablets (15 mg total) by mouth daily. 45 tablet 3   latanoprost (XALATAN) 0.005 % ophthalmic solution Place 1 drop into both eyes at bedtime.      ONETOUCH VERIO test strip 1 EACH BY OTHER ROUTE 2 (TWO) TIMES DAILY. USE AS INSTRUCTED 200 strip 1   oxyCODONE-acetaminophen (PERCOCET/ROXICET) 5-325 MG tablet Take 1 tablet by mouth every 8 (eight) hours as needed for severe pain (pain score 7-10). 90 tablet 0   pantoprazole (PROTONIX) 40 MG tablet TAKE 1 TABLET BY MOUTH 2 TIMES DAILY BEFORE A MEAL 180 tablet 0   SYNJARDY 12.5-500 MG TABS TAKE 1 TABLET BY MOUTH 2 (TWO) TIMES DAILY. TAKE 2 TABLETS DAILY. 180 tablet 1   TRULICITY 1.5 MG/0.5ML SOAJ INJECT 1.5 MG (0.5ML) UNDER THE SKIN ONCE A WEEK 6 mL 0   No facility-administered medications prior to visit.    PAST MEDICAL HISTORY: Past Medical History:  Diagnosis Date   Anemia    Anxiety and depression    Arthritis    Asthma    Cataract    Degenerative arthritis    Depression    Diabetes mellitus, type 2 (HCC)    Fatigue    GERD (gastroesophageal  reflux disease)    Glaucoma    Heart palpitations    Hemorrhoids    Hyperlipidemia    Hypertension    Hypothyroidism    Hypoxemia 11/24/2013   Memory deficit 10/04/2013   Obesity    Sleep apnea    BiPAP   Snoring disorder     PAST SURGICAL HISTORY: Past Surgical History:  Procedure Laterality Date   benign tumors resected     CATARACT EXTRACTION Bilateral    FOOT SURGERY Left    bone spur   ROTATOR CUFF REPAIR Left    TUBAL LIGATION      FAMILY HISTORY: Family History  Problem Relation Age of Onset   Alcohol abuse Mother    Heart attack Father    Hypertension Father    Hyperlipidemia Father    Heart disease Sister    Atrial fibrillation Sister    Hypertension Sister    Hyperlipidemia Sister    Coronary artery disease Brother    Heart attack Brother    Hypertension Brother    Hyperlipidemia Brother    Colon cancer Brother 51   Hypertension Brother    Hyperlipidemia Brother    Dementia Brother    Diabetes Son    Hypertension Son    Hyperlipidemia Son    Diabetes Son    Hypertension Son    Hyperlipidemia Son    Cerebral palsy Son    Hypertension Son    Hyperlipidemia Son    Hypertension Other        family history   Alcohol abuse Other    Esophageal cancer Neg Hx    Stomach cancer Neg Hx    Pancreatic cancer Neg Hx    Liver disease Neg Hx     SOCIAL HISTORY: Social History   Socioeconomic History   Marital status:  Widowed    Spouse name: Not on file   Number of children: 3   Years of education: 11th   Highest education level: 11th grade  Occupational History   Occupation: disabled  Tobacco Use   Smoking status: Never    Passive exposure: Never   Smokeless tobacco: Never  Vaping Use   Vaping status: Never Used  Substance and Sexual Activity   Alcohol use: No    Alcohol/week: 0.0 standard drinks of alcohol   Drug use: No   Sexual activity: Not Currently    Partners: Male    Comment: not working, lives with husband, 3 sons  Other Topics  Concern   Not on file  Social History Narrative   Client's spouse recently passed Feb 2021      Lives with 2 Son and 1 granddaughter   R handed   Caffeine: ocas., has cut back and drinking more water   Social Drivers of Health   Financial Resource Strain: Low Risk  (02/10/2023)   Overall Financial Resource Strain (CARDIA)    Difficulty of Paying Living Expenses: Not hard at all  Food Insecurity: No Food Insecurity (02/10/2023)   Hunger Vital Sign    Worried About Running Out of Food in the Last Year: Never true    Ran Out of Food in the Last Year: Never true  Transportation Needs: No Transportation Needs (02/10/2023)   PRAPARE - Administrator, Civil Service (Medical): No    Lack of Transportation (Non-Medical): No  Physical Activity: Inactive (02/10/2023)   Exercise Vital Sign    Days of Exercise per Week: 0 days    Minutes of Exercise per Session: 0 min  Stress: No Stress Concern Present (02/10/2023)   [employer] of Occupational Health - Occupational Stress Questionnaire    Feeling of Stress : Only a little  Social Connections: Moderately Isolated (02/10/2023)   Social Connection and Isolation Panel [NHANES]    Frequency of Communication with Friends and Family: More than three times a week    Frequency of Social Gatherings with Friends and Family: Once a week    Attends Religious Services: 1 to 4 times per year    Active Member of [employer] or Organizations: No    Attends Banker Meetings: Never    Marital Status: Widowed  Intimate Partner Violence: Not At Risk (02/10/2023)   Humiliation, Afraid, Rape, and Kick questionnaire    Fear of Current or Ex-Partner: No    Emotionally Abused: No    Physically Abused: No    Sexually Abused: No     PHYSICAL EXAM  There were no vitals filed for this visit.   There is no height or weight on file to calculate BMI.  Generalized: Well developed, in no acute distress  Cardiology: normal rate and rhythm, no  murmur noted Respiratory: clear to auscultation bilaterally  Neurological examination  Mentation: Alert oriented to time, place, history taking. Follows all commands speech and language fluent Cranial nerve II-XII: Pupils were equal round reactive to light. Extraocular movements were full, visual field were full  Motor: The motor testing reveals 5 over 5 strength of all 4 extremities. Good symmetric motor tone is noted throughout.  Coordination: finger to nose and heel to shin intact, bilaterally.  Gait and station: Pushes to standing position. Gait is stable with cane. Tandem not attempted.    DIAGNOSTIC DATA (LABS, IMAGING, TESTING) - I reviewed patient records, labs, notes, testing and imaging myself where available.  04/29/2016    1:59 PM 04/29/2015   12:14 PM 04/29/2015   11:57 AM  MMSE - Mini Mental State Exam  Not completed: -- -- Unable to complete       01/26/2023   10:48 AM 08/12/2022    2:32 PM  Montreal Cognitive Assessment   Visuospatial/ Executive (0/5) 4 2  Naming (0/3) 3 3  Attention: Read list of digits (0/2) 2 1  Attention: Read list of letters (0/1) 1 1  Attention: Serial 7 subtraction starting at 100 (0/3) 3 1  Language: Repeat phrase (0/2) 2 2  Language : Fluency (0/1) 1 0  Abstraction (0/2) 2 2  Delayed Recall (0/5) 4 3  Orientation (0/6) 6 6  Total 28 21     Lab Results  Component Value Date   WBC 8.1 12/21/2023   HGB 11.2 (L) 12/21/2023   HCT 34.6 (L) 12/21/2023   MCV 88.7 12/21/2023   PLT 320.0 12/21/2023      Component Value Date/Time   NA 138 12/21/2023 1358   NA 143 08/31/2023 0000   K 4.4 12/21/2023 1358   CL 99 12/21/2023 1358   CO2 30 12/21/2023 1358   GLUCOSE 102 (H) 12/21/2023 1358   BUN 11 12/21/2023 1358   BUN 12 08/31/2023 0000   CREATININE 1.08 12/21/2023 1358   CALCIUM 10.0 12/21/2023 1358   PROT 7.8 12/21/2023 1358   ALBUMIN 4.5 12/21/2023 1358   AST 17 12/21/2023 1358   ALT 15 12/21/2023 1358   ALKPHOS 112 12/21/2023  1358   BILITOT 0.4 12/21/2023 1358   GFRNONAA >60 08/29/2015 0630   GFRAA >60 08/29/2015 0630   Lab Results  Component Value Date   CHOL 152 05/11/2023   HDL 48.90 05/11/2023   LDLCALC 56 05/05/2022   LDLDIRECT 60.0 05/11/2023   TRIG 253.0 (H) 05/11/2023   CHOLHDL 3 05/11/2023   Lab Results  Component Value Date   HGBA1C 6.4 12/21/2023   Lab Results  Component Value Date   VITAMINB12 176 (L) 09/08/2022   Lab Results  Component Value Date   TSH 3.00 02/09/2023     ASSESSMENT AND PLAN 78 y.o. year old female  has a past medical history of Anemia, Anxiety and depression, Arthritis, Asthma, Cataract, Degenerative arthritis, Depression, Diabetes mellitus, type 2 (HCC), Fatigue, GERD (gastroesophageal reflux disease), Glaucoma, Heart palpitations, Hemorrhoids, Hyperlipidemia, Hypertension, Hypothyroidism, Hypoxemia (11/24/2013), Memory deficit (10/04/2013), Obesity, Sleep apnea, and Snoring disorder. here with     ICD-10-CM   1. Obstructive sleep apnea treated with BiPAP  G47.33     2. Forgetfulness  R68.89        Valerie [name] is doing well on BiPAP therapy. Received last machine 2021. Compliance report reveals excellent daily compliance and optimal four hour usage. She was encouraged to continue using BiPAP nightly and for greater than 4 hours each night. We will update supply orders as indicated. Risks of untreated sleep apnea review and education materials provided. She continues to have regular episodes of dizziness with a fall about two weeks ago. We have discussed need for close blood pressure monitoring with PCP as she states home readings fluctuate daily from 120-190 over 60-90's. She is drinking around 64-80 ounces of water daily. CBGs are well controlled. PT did not help. MRI in 2022 was unremarkable. She is taking gabapentin 300mg  TID and oxycodone sparingly but have been on these for years. MOCA was 21/30, however, pateint was distracted by answering her phone during  exam. May need to  perform MMSE in future if MOCA reamins low. She denies any significant memory loss. I will have her return to see Dr Vickey Huger as scheduled in 10/2022. May consider ENT referral if needed. Healthy lifestyle habits encouraged. She verbalizes understanding and agreement with this plan.    No orders of the defined types were placed in this encounter.     No orders of the defined types were placed in this encounter.      [name], FNP-C 01/25/2024, 12:44 PM [facility] [address] [place], [place] [address] [phone]

## 2024-01-25 NOTE — Progress Notes (Unsigned)
[name]

## 2024-01-26 ENCOUNTER — Ambulatory Visit: Payer: PPO | Admitting: Family Medicine

## 2024-01-26 ENCOUNTER — Encounter: Payer: Self-pay | Admitting: Family Medicine

## 2024-01-26 VITALS — BP 154/92 | HR 91 | Ht 66.0 in | Wt 220.0 lb

## 2024-01-26 DIAGNOSIS — G4733 Obstructive sleep apnea (adult) (pediatric): Secondary | ICD-10-CM | POA: Diagnosis not present

## 2024-01-26 DIAGNOSIS — R6889 Other general symptoms and signs: Secondary | ICD-10-CM

## 2024-01-26 NOTE — Progress Notes (Signed)
Community message sent to [employer] that CPAP supplies order placed.

## 2024-01-31 ENCOUNTER — Telehealth: Payer: Self-pay

## 2024-01-31 ENCOUNTER — Telehealth: Payer: Self-pay | Admitting: Family Medicine

## 2024-01-31 NOTE — Telephone Encounter (Signed)
Spoke with pt. Pt was notified of echo results. Pt voiced understanding and will f/u as planned.

## 2024-01-31 NOTE — Telephone Encounter (Signed)
 HST- HTA pending

## 2024-02-03 ENCOUNTER — Ambulatory Visit: Payer: PPO | Attending: Nurse Practitioner | Admitting: Nurse Practitioner

## 2024-02-03 NOTE — Progress Notes (Deleted)
 Office Visit    Patient Name: Valerie [name] Date of Encounter: 02/03/2024  Primary Care Provider:  Etta Grandchild, MD Primary Cardiologist:  Nanetta Batty, MD  Chief Complaint    78 year old female with a history of elevated coronary artery calcium score/nonobstructive CAD, palpitations, hypertension, hyperlipidemia, OSA, GERD, and hypothyroidism who presents for follow-up related to CAD and hypertension.   Past Medical History    Past Medical History:  Diagnosis Date   Anemia    Anxiety and depression    Arthritis    Asthma    Cataract    Degenerative arthritis    Depression    Diabetes mellitus, type 2 (HCC)    Fatigue    GERD (gastroesophageal reflux disease)    Glaucoma    Heart palpitations    Hemorrhoids    Hyperlipidemia    Hypertension    Hypothyroidism    Hypoxemia 11/24/2013   Memory deficit 10/04/2013   Obesity    Sleep apnea    BiPAP   Snoring disorder    Past Surgical History:  Procedure Laterality Date   benign tumors resected     CATARACT EXTRACTION Bilateral    FOOT SURGERY Left    bone spur   ROTATOR CUFF REPAIR Left    TUBAL LIGATION      Allergies  Allergies  Allergen Reactions   Food Swelling    bananas   Penicillins Swelling and Rash     Labs/Other Studies Reviewed    The following studies were reviewed today:  Cardiac Studies & Procedures   ______________________________________________________________________________________________   STRESS TESTS  NM PET CT CARDIAC PERFUSION MULTI W/ABSOLUTE BLOODFLOW 11/18/2023  Narrative   The study is normal. The study is intermediate risk. Study is suggestive of mild microvascular disease.   LV perfusion is normal. There is no evidence of ischemia. There is no evidence of infarction.   End diastolic cavity size is normal. End systolic cavity size is normal.   Myocardial blood flow was computed to be 0.35ml/g/min at rest and 1.10ml/g/min at stress. Global myocardial blood flow  reserve was 1.98 and was mildly abnormal.   Coronary calcium was present on the attenuation correction CT images. Severe coronary calcifications were present. Coronary calcifications were present in the left anterior descending artery, left circumflex artery and right coronary artery distribution(s). Aortic atherosclerosis noted.   Electronically Signed  By: Riley Lam M.D.  CLINICAL DATA:  This over-read does not include interpretation of cardiac or coronary anatomy or pathology. The Cardiac PET CT interpretation by the cardiologist is attached.  COMPARISON:  None Available.  FINDINGS: Cardiovascular: Aortic atherosclerosis. Normal heart size. Three-vessel coronary artery calcifications. No pericardial effusion.  Limited Mediastinum/Nodes: No enlarged mediastinal, hilar, or axillary lymph nodes. Small hiatal hernia. Trachea and esophagus demonstrate no significant findings.  Limited Lungs/Pleura: Mild bibasilar scarring or atelectasis. No pleural effusion or pneumothorax.  Upper Abdomen: No acute abnormality.  Musculoskeletal: No chest wall abnormality. No acute osseous findings.  IMPRESSION: 1. No acute CT findings of the included chest. 2. Coronary artery disease. 3. Small hiatal hernia.  Aortic Atherosclerosis (ICD10-I70.0).   Electronically Signed By: Jearld Lesch M.D. On: 11/18/2023 14:31   ECHOCARDIOGRAM  ECHOCARDIOGRAM COMPLETE 01/12/2024  Narrative ECHOCARDIOGRAM REPORT    Patient Name:   Valerie [name] Date of Exam: 01/12/2024 Medical Rec #:  [id number]       Height:       66.0 in Accession #:    [id number]  Weight:       218.8 lb Date of Birth:  16-Jul-1946       BSA:          2.078 m Patient Age:    77 years        BP:           121/70 mmHg Patient Gender: F               HR:           63 bpm. Exam Location:  Outpatient  Procedure: 2D Echo, 3D Echo, Cardiac Doppler, Color Doppler and Strain Analysis  Indications:    SOB (shortness of  breath) [R06.02 (ICD-10-CM)]; Lower extremity edema [R60.0 (ICD-10-CM)]  History:        Patient has prior history of Echocardiogram examinations, most recent 10/03/2018. Risk Factors:Hypertension, Diabetes and Dyslipidemia.  Sonographer:    Gertie Fey MHA, RDMS, RVT, RDCS Referring Phys: [id number] Evoleth Nordmeyer C Mccall [place]   Sonographer Comments: Global longitudinal strain was attempted. IMPRESSIONS   1. Left ventricular ejection fraction, by estimation, is 55 to 60%. Left ventricular ejection fraction by 3D volume is 58 %. The left ventricle has normal function. The left ventricle has no regional wall motion abnormalities. Left ventricular diastolic parameters were normal. The average left ventricular global longitudinal strain is -17.6 %. The global longitudinal strain is normal. 2. Right ventricular systolic function is low normal. The right ventricular size is normal. There is normal pulmonary artery systolic pressure. 3. The mitral valve is degenerative. No evidence of mitral valve regurgitation. No evidence of mitral stenosis. 4. The aortic valve is tricuspid. Aortic valve regurgitation is not visualized. No aortic stenosis is present.  Comparison(s): A prior study was performed on 10/03/2018. No significant change from prior study.  FINDINGS Left Ventricle: Left ventricular ejection fraction, by estimation, is 55 to 60%. Left ventricular ejection fraction by 3D volume is 58 %. The left ventricle has normal function. The left ventricle has no regional wall motion abnormalities. The average left ventricular global longitudinal strain is -17.6 %. The global longitudinal strain is normal. The left ventricular internal cavity size was normal in size. There is no left ventricular hypertrophy. Left ventricular diastolic parameters were normal.  Right Ventricle: The right ventricular size is normal. No increase in right ventricular wall thickness. Right ventricular systolic function is low normal.  There is normal pulmonary artery systolic pressure. The tricuspid regurgitant velocity is 0.83 m/s, and with an assumed right atrial pressure of 3 mmHg, the estimated right ventricular systolic pressure is 5.8 mmHg.  Left Atrium: Left atrial size was normal in size.  Right Atrium: Right atrial size was normal in size.  Pericardium: Trivial pericardial effusion is present.  Mitral Valve: The mitral valve is degenerative in appearance. There is mild thickening of the mitral valve leaflet(s). Normal mobility of the mitral valve leaflets. Mild mitral annular calcification. No evidence of mitral valve regurgitation. No evidence of mitral valve stenosis.  Tricuspid Valve: The tricuspid valve is normal in structure. Tricuspid valve regurgitation is not demonstrated. No evidence of tricuspid stenosis.  Aortic Valve: The aortic valve is tricuspid. Aortic valve regurgitation is not visualized. No aortic stenosis is present. Aortic valve mean gradient measures 3.0 mmHg. Aortic valve peak gradient measures 4.8 mmHg. Aortic valve area, by VTI measures 2.44 cm.  Pulmonic Valve: The pulmonic valve was normal in structure. Pulmonic valve regurgitation is not visualized. No evidence of pulmonic stenosis.  Aorta: The aortic root and ascending aorta are structurally  normal, with no evidence of dilitation.  IAS/Shunts: The atrial septum is grossly normal.   LEFT VENTRICLE PLAX 2D LVIDd:         4.10 cm         Diastology LVIDs:         2.59 cm         LV e' medial:    8.92 cm/s LV PW:         0.98 cm         LV E/e' medial:  8.9 LV IVS:        0.79 cm         LV e' lateral:   7.83 cm/s LVOT diam:     1.89 cm         LV E/e' lateral: 10.2 LV SV:         65 LV SV Index:   31              2D LVOT Area:     2.81 cm        Longitudinal Strain 2D Strain GLS  -17.6 % Avg:  3D Volume EF LV 3D EF:    Left ventricul ar ejection fraction by 3D volume is 58 %.  3D Volume EF: 3D EF:        58 % LV  EDV:       114 ml LV ESV:       49 ml LV SV:        66 ml  RIGHT VENTRICLE RV Basal diam:  2.63 cm RV Mid diam:    2.76 cm RV S prime:     9.36 cm/s TAPSE (M-mode): 2.2 cm  LEFT ATRIUM           Index        RIGHT ATRIUM           Index LA diam:      2.98 cm 1.43 cm/m   RA Area:     10.24 cm LA Vol (A4C): 57.9 ml 27.87 ml/m  RA Volume:   22.55 ml  10.85 ml/m AORTIC VALVE AV Area (Vmax):    2.39 cm AV Area (Vmean):   2.32 cm AV Area (VTI):     2.44 cm AV Vmax:           110.00 cm/s AV Vmean:          72.400 cm/s AV VTI:            0.264 m AV Peak Grad:      4.8 mmHg AV Mean Grad:      3.0 mmHg LVOT Vmax:         93.80 cm/s LVOT Vmean:        59.800 cm/s LVOT VTI:          0.230 m LVOT/AV VTI ratio: 0.87  AORTA Ao Root diam: 2.83 cm Ao Asc diam:  3.40 cm  MITRAL VALVE               TRICUSPID VALVE MV Area (PHT): 3.27 cm    TR Peak grad:   2.8 mmHg MV Decel Time: 232 msec    TR Vmax:        83.00 cm/s MR Peak grad: 2.6 mmHg MR Vmax:      80.00 cm/s   SHUNTS MV E velocity: 79.80 cm/s  Systemic VTI:  0.23 m MV A velocity: 92.10 cm/s  Systemic Diam: 1.89 cm MV E/A ratio:  0.87  [employer] Electronically signed by Tessa Lerner Signature Date/Time: 01/12/2024/3:34:37 PM    Final    MONITORS  CARDIAC EVENT MONITOR 04/04/2020  Narrative 1. NSR with occasional PVCs 2. Short atrial runs   CT SCANS  CT CORONARY MORPH W/CTA COR W/SCORE 04/06/2022  Addendum 04/06/2022 12:02 PM ADDENDUM REPORT: 04/06/2022 12:00  EXAM: OVER-READ INTERPRETATION  PET-CT CHEST  The following report is an over-read performed by radiologist Dr. Leatha Gilding [facility] on 04/06/2022. This over-read does not include interpretation of cardiac or coronary anatomy or pathology. The cardiac CT interpretation by the cardiologist is to be attached.  COMPARISON:  CT a chest 08/29/2015  FINDINGS: The visualized lung parenchyma shows no suspicious pulmonary nodule or  mass. Calcified granuloma noted left lower lobe. No focal airspace consolidation. No effusion.  No evidence for lymphadenopathy within the visualized mediastinum or hilar regions. Tiny hiatal hernia noted.  No suspicious lytic or sclerotic osseous abnormality.  IMPRESSION: No acute or clinically significant extracardiac findings.   Electronically Signed By: Kennith Center M.D. On: 04/06/2022 12:00  Narrative CLINICAL DATA:  89F with chest pain  EXAM: Cardiac/Coronary CTA  TECHNIQUE: The patient was scanned on a [employer].  FINDINGS: A 100 kV prospective scan was triggered in the descending thoracic aorta at 111 HU's. Axial non-contrast 3 mm slices were carried out through the heart. The data set was analyzed on a dedicated work station and scored using the Agatson method. Gantry rotation speed was 250 msecs and collimation was .6 mm. No beta blockade and 0.8 mg of sl NTG was given. The 3D data set was reconstructed in 5% intervals of the 67-82 % of the R-R cycle. Diastolic phases were analyzed on a dedicated work station using MPR, MIP and VRT modes. The patient received 80 cc of contrast.  Coronary Arteries:  Normal coronary origin.  Right dominance.  RCA is a large dominant artery that gives rise to PDA and PLA. Calcified plaque in the proximal RCA causes 0-24% stenosis. Calcified plaque in the mid RCA causes 25-49% stenosis.  Left main is a large artery that gives rise to LAD and LCX arteries.  LAD is a large vessel. Calcified plaque in the proximal LAD causes 0-24% stenosis. Calcified plaque in the mid LAD causes 25-49% stenosis. Calcified plaque in the distal LAD causes 25-49% stenosis  LCX is a non-dominant artery that gives rise to one large OM1 branch. Calcified plaque in the proximal LCX causes 0-24% stenosis. Calcified plaque in the mid LCX causes 0-24% stenosis  Calcified plaque in ramus causes 25-49% stenosis  Other findings:  Left  Ventricle: Normal size  Left Atrium: Normal size  Pulmonary Veins: Normal configuration  Right Ventricle: Normal size  Right Atrium: Normal size  Cardiac valves: No calcifications  Thoracic aorta: Normal size  Pulmonary Arteries: Normal size  Systemic Veins: Normal drainage  Pericardium: Normal thickness  IMPRESSION: 1. Coronary calcium score of 454. This was 90th percentile for age and sex matched control.  2.  Normal coronary origin with right dominance.  3.  Nonobstructive CAD  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  PFO  CAD-RADS 2. Mild non-obstructive CAD (25-49%). Consider non-atherosclerotic causes of chest pain. Consider preventive therapy and risk factor modification.  Electronically Signed: By: Epifanio Lesches M.D. On: 04/06/2022 11:26   CT SCANS  CT CARDIAC SCORING (SELF PAY ONLY) 02/12/2022  Addendum 02/12/2022  4:39 PM ADDENDUM REPORT: 02/12/2022 16:37  EXAM: OVER-READ INTERPRETATION  CT CHEST  The following report is an over-read performed by radiologist Dr. Marinda Elk [facility] on 02/12/2022. This over-read does not include interpretation of cardiac or coronary anatomy or pathology. The coronary calcium score interpretation by the cardiologist is attached.  COMPARISON:  CT a of the chest on 08/29/2015  FINDINGS: Atherosclerosis of the thoracic aorta. Visualized mediastinum and hilar regions demonstrate no lymphadenopathy or masses. Stable small hiatal hernia. Visualized lungs show no evidence of pulmonary edema, consolidation, pneumothorax, nodule or pleural fluid. Visualized upper abdomen and bony structures are unremarkable.  IMPRESSION: 1. Atherosclerosis of the thoracic aorta. 2. Stable small hiatal hernia.   Electronically Signed By: Irish Lack M.D. On: 02/12/2022 16:37  Narrative CLINICAL DATA:  Cardiovascular Disease Risk stratification  EXAM: Coronary Calcium Score  TECHNIQUE: A  gated, non-contrast computed tomography scan of the heart was performed using 3mm slice thickness. Axial images were analyzed on a dedicated workstation. Calcium scoring of the coronary arteries was performed using the Agatston method.  FINDINGS: Coronary arteries: Normal origins.  Coronary Calcium Score:  Left main: 47.6  Left anterior descending artery: 126  Left circumflex artery: 266  Right coronary artery: 60.2  Total: 499  Percentile: 92nd  Pericardium: Normal.  Ascending Aorta: Normal caliber. Atherosclerosis of the ascending and descending aorta.  Non-cardiac: See separate report from [facility].  IMPRESSION: Coronary calcium score of 499. This was 92nd percentile for age-, race-, and sex-matched controls.  RECOMMENDATIONS: Coronary artery calcium (CAC) score is a strong predictor of incident coronary heart disease (CHD) and provides predictive information beyond traditional risk factors. CAC scoring is reasonable to use in the decision to withhold, postpone, or initiate statin therapy in intermediate-risk or selected borderline-risk asymptomatic adults (age 82-75 years and LDL-C >=70 to <190 mg/dL) who do not have diabetes or established atherosclerotic cardiovascular disease (ASCVD).* In intermediate-risk (10-year ASCVD risk >=7.5% to <20%) adults or selected borderline-risk (10-year ASCVD risk >=5% to <7.5%) adults in whom a CAC score is measured for the purpose of making a treatment decision the following recommendations have been made:  If CAC=0, it is reasonable to withhold statin therapy and reassess in 5 to 10 years, as long as higher risk conditions are absent (diabetes mellitus, family history of premature CHD in first degree relatives (males <55 years; females <65 years), cigarette smoking, or LDL >=190 mg/dL).  If CAC is 1 to 99, it is reasonable to initiate statin therapy for patients >=66 years of age.  If CAC is >=100 or >=75th  percentile, it is reasonable to initiate statin therapy at any age.  Cardiology referral should be considered for patients with CAC scores >=400 or >=75th percentile.  *2018 AHA/ACC/AACVPR/AAPA/ABC/ACPM/ADA/AGS/APhA/ASPC/NLA/PCNA Guideline on the Management of Blood Cholesterol: A Report of the American College of Cardiology/American Heart Association Task Force on Clinical Practice Guidelines. J Am Coll Cardiol. 2019;73(24):3168-3209.  Armanda Magic, MD  Electronically Signed: By: Armanda Magic M.D. On: 02/12/2022 12:20     ______________________________________________________________________________________________     Recent Labs: 02/09/2023: TSH 3.00 12/21/2023: ALT 15; BUN 11; Creatinine, Ser 1.08; Hemoglobin 11.2; Platelets 320.0; Potassium 4.4; Sodium 138  Recent Lipid Panel    Component Value Date/Time   CHOL 152 05/11/2023 1400   TRIG 253.0 (H) 05/11/2023 1400   HDL 48.90 05/11/2023 1400   CHOLHDL 3 05/11/2023 1400   VLDL 50.6 (H) 05/11/2023 1400   LDLCALC 56 05/05/2022 1356   LDLDIRECT 60.0 05/11/2023 1400    History of Present Illness    78 year old female with the  above past medical history including elevated coronary artery calcium score/nonobstructive CAD, palpitations, hypertension, hyperlipidemia, OSA, GERD, and hypothyroidism.   She was initially referred to cardiology in the setting of palpitations.  Echocardiogram and Myoview in 2019 were normal.  Outpatient cardiac monitor in 03/2020 showed sinus rhythm with PVCs, short atrial runs, no significant arrhythmia.  Coronary CTA in 03/2022 showed coronary calcium score of 454 (90th percentile), nonobstructive CAD.  She saw her PCP on 08/18/2023 in the setting of chest pain.  EKG and troponin were reassuring.  Cardiac PET stress test in 11/2023 showed normal LV perfusion, no evidence of ischemia or infarction, EF 69%, mild microvascular disease. She was last seen in the office on 12/13/2023 and was stable from a cardiac  standpoint.  She continued to note intermittent chest discomfort both at rest and with exertion, mild shortness of breath, unchanged from prior visits, chronic bilateral lower extremity edema.  Repeat echocardiogram in 12/2023 EF 55 to 60%, normal LV function, no RWMA, normal RV systolic function, no significant valvular abnormalities, no significant change from prior study.   She presents today for follow-up. Since her last visit she  1. CAD/chest pain/dyspnea on exertion: Coronary CTA in 03/2022 showed coronary calcium score of 454 (90th percentile), nonobstructive CAD. Cardiac PET stress test in 11/2023 showed normal LV perfusion, no evidence of ischemia or infarction, EF 69%, mild microvascular disease.  She continues to note intermittent chest discomfort both at rest and with exertion. Symptoms can last from minutes to hours at a time.  She has stable chronic dyspnea, nonpitting bilateral lower extremity edema.  Will trial Imdur 15 mg daily. Will update echocardiogram in the setting of lower extremity edema, shortness of breath.  Reviewed ED precautions. Continue aspirin, carvedilol, irbesartan and Lipitor.   2. Palpitations: Outpatient cardiac monitor in 03/2020 showed sinus rhythm with PVCs, short atrial runs, no significant arrhythmia. Denies any recent palpitations. Continue carvedilol.    3. Hypertension: BP well controlled. Continue current antihypertensive regimen.    4. Hyperlipidemia: LDL was 60 in 04/2023.  Continue Lipitor.   5. OSA: Adherent to CPAP.   6. Disposition: Follow-up in  Home Medications    Current Outpatient Medications  Medication Sig Dispense Refill   albuterol (PROVENTIL) (2.5 MG/3ML) 0.083% nebulizer solution Take 3 mLs (2.5 mg total) by nebulization every 6 (six) hours as needed for wheezing or shortness of breath. 75 mL 3   albuterol (VENTOLIN HFA) 108 (90 Base) MCG/ACT inhaler Inhale 2 puffs into the lungs every 6 (six) hours as needed. For shortness of breath 3  each 4   aspirin 81 MG tablet Take 81 mg by mouth daily.      atorvastatin (LIPITOR) 20 MG tablet Take 1 tablet (20 mg total) by mouth daily. 90 tablet 0   Blood Glucose Calibration (ONETOUCH VERIO) LIQD 1 Act by In Vitro route daily. 1 each 5   Blood Glucose Monitoring Suppl (ONETOUCH VERIO FLEX SYSTEM) DEVI 1 Act by Does not apply route 2 (two) times daily. 1 each 3   budesonide-formoterol (SYMBICORT) 80-4.5 MCG/ACT inhaler Inhale 2 puffs into the lungs 2 (two) times daily as needed. 3 each 1   carvedilol (COREG) 25 MG tablet TAKE 1 TABLET BY MOUTH TWICE A DAY WITH FOOD 180 tablet 0   Cholecalciferol (VITAMIN D3) 50 MCG (2000 UT) capsule TAKE 1 CAPSULE BY MOUTH EVERY DAY 90 capsule 1   clobetasol ointment (TEMOVATE) 0.05 % Apply 1 application topically 2 (two) times daily. 60 g 1  Continuous Blood Gluc Receiver (FREESTYLE LIBRE 2 READER) DEVI 1 Act by Does not apply route daily. (Patient not taking: Reported on 01/26/2024) 2 each 5   Continuous Blood Gluc Sensor (FREESTYLE LIBRE 2 SENSOR) MISC 1 Act by Does not apply route daily. (Patient not taking: Reported on 01/26/2024) 2 each 5   famotidine (PEPCID) 40 MG tablet TAKE 1 TABLET BY MOUTH EVERY DAY 90 tablet 1   ferrous sulfate 325 (65 FE) MG tablet TAKE 1 TABLET BY MOUTH 3 TIMES DAILY WITH MEALS. 270 tablet 0   Finerenone (KERENDIA) 20 MG TABS Take 1 tablet (20 mg total) by mouth daily. 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 DX: E11.8 300 strip 1   irbesartan (AVAPRO) 300 MG tablet Take 0.5 tablets (150 mg total) by mouth daily. (Patient taking differently: Take 150 mg by mouth daily as needed.) 45 tablet 1   isosorbide mononitrate (IMDUR) 30 MG 24 hr tablet Take 0.5 tablets (15 mg total) by mouth daily. 45 tablet 3   latanoprost (XALATAN) 0.005 % ophthalmic  solution Place 1 drop into both eyes at bedtime.      ONETOUCH VERIO test strip 1 EACH BY OTHER ROUTE 2 (TWO) TIMES DAILY. USE AS INSTRUCTED 200 strip 1   oxyCODONE-acetaminophen (PERCOCET/ROXICET) 5-325 MG tablet Take 1 tablet by mouth every 8 (eight) hours as needed for severe pain (pain score 7-10). 90 tablet 0   pantoprazole (PROTONIX) 40 MG tablet TAKE 1 TABLET BY MOUTH 2 TIMES DAILY BEFORE A MEAL 180 tablet 0   SYNJARDY 12.5-500 MG TABS TAKE 1 TABLET BY MOUTH 2 (TWO) TIMES DAILY. TAKE 2 TABLETS DAILY. 180 tablet 1   TRULICITY 1.5 MG/0.5ML SOAJ INJECT 1.5 MG (0.5ML) UNDER THE SKIN ONCE A WEEK 6 mL 0   No current facility-administered medications for this visit.     Review of Systems    ***.  All other systems reviewed and are otherwise negative except as noted above.    Physical Exam    VS:  There were no vitals taken for this visit. , BMI There is no height or weight on file to calculate BMI.     GEN: Well nourished, well developed, in no acute distress. HEENT: normal. Neck: Supple, no JVD, carotid bruits, or masses. Cardiac: RRR, no murmurs, rubs, or gallops. No clubbing, cyanosis, edema.  Radials/DP/PT 2+ and equal bilaterally.  Respiratory:  Respirations regular and unlabored, clear to auscultation bilaterally. GI: Soft, nontender, nondistended, BS + x 4. MS: no deformity or atrophy. Skin: warm and dry, no rash. Neuro:  Strength and sensation are intact. Psych: Normal affect.  Accessory Clinical Findings    ECG personally reviewed by me today -    - no acute changes.   Lab Results  Component Value Date   WBC 8.1 12/21/2023   HGB 11.2 (L) 12/21/2023   HCT 34.6 (L) 12/21/2023   MCV 88.7 12/21/2023   PLT 320.0 12/21/2023   Lab Results  Component Value Date   CREATININE 1.08 12/21/2023   BUN 11 12/21/2023   NA 138 12/21/2023   K 4.4 12/21/2023   CL 99 12/21/2023   CO2 30 12/21/2023   Lab Results  Component Value Date   ALT 15 12/21/2023   AST 17 12/21/2023    ALKPHOS  112 12/21/2023   BILITOT 0.4 12/21/2023   Lab Results  Component Value Date   CHOL 152 05/11/2023   HDL 48.90 05/11/2023   LDLCALC 56 05/05/2022   LDLDIRECT 60.0 05/11/2023   TRIG 253.0 (H) 05/11/2023   CHOLHDL 3 05/11/2023    Lab Results  Component Value Date   HGBA1C 6.4 12/21/2023    Assessment & Plan    1.  ***  No BP recorded.  {Refresh Note OR Click here to enter BP  :1}***   Joylene Grapes, NP 02/03/2024, 7:50 AM

## 2024-02-07 NOTE — Telephone Encounter (Signed)
 Called HTA to check the status- it is still pending.

## 2024-02-11 ENCOUNTER — Other Ambulatory Visit: Payer: Self-pay | Admitting: Internal Medicine

## 2024-02-11 DIAGNOSIS — E876 Hypokalemia: Secondary | ICD-10-CM

## 2024-02-11 DIAGNOSIS — I10 Essential (primary) hypertension: Secondary | ICD-10-CM

## 2024-02-14 NOTE — Telephone Encounter (Signed)
 HST HTA Berkley Harvey: [id number] (exp. 01/31/24 to 04/30/24)

## 2024-02-21 ENCOUNTER — Ambulatory Visit: Payer: PPO

## 2024-02-22 ENCOUNTER — Ambulatory Visit: Admitting: Neurology

## 2024-02-22 DIAGNOSIS — G4733 Obstructive sleep apnea (adult) (pediatric): Secondary | ICD-10-CM | POA: Diagnosis not present

## 2024-02-23 NOTE — Progress Notes (Signed)
 [facility]   HOME SLEEP TEST REPORT ( by Watch PAT)   STUDY DATE:  02-22-2024    ORDERING CLINICIAN:  REFERRING CLINICIAN:    CLINICAL INFORMATION/HISTORY:  2-12 -2025 visit with [name], NP   On BiPAP 12/ 9 cm water  residual AHI 0.3/h.  01/26/24 ALL:  Valerie [name] returns for follow up for OSA on BiPAP. She reports doing fairly well. She is using BiPAP most every night. She has some trouble falling asleep at night. She is up and down all night. Sometimes she can sleep 2-3 hours and sometimes 8-9.    MOCA was 28/30 at last visit with Dr Vickey Huger 01/2023. She feels memory is stable. No significant changes. 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 She has a grandchild that lives with her. She lost her husband in 2021. She has another son who helps her care for her son and grandchild. Her oldest son and granddaughter help her when needed.     Epworth sleepiness score:  4 /24., FSS at X/ 63 points, GDS  x/ 15 points.    BMI: 35.51 kg/m    Neck Circumference:    FINDINGS:   Sleep Summary:   Total Recording Time (hours, min):   9 hours 48 minutes  Total Sleep Time (hours, min):       7 hours 53 minutes          Percent REM (%):   23.4%                                     Respiratory Indices:   Calculated pAHI (per hour):        Following CMS criteria her current AHI is 2.1/h and REM sleep 1.1/h and in non-REM sleep 2.4/h.    If I apply AASM criteria however the patient's AHI would be 13.1/h                       REM pAHI:    By AASM criteria 6.5/h                                             NREM pAHI:   By AASM criteria 15.1/h                           Positional AHI: Supine sleep was present for 335 minutes with an AHI by AASM criteria of 14.5/h and spite CMS criteria only 1.6/h.  There were 135 minutes of sleep in the right lateral position and here the AHI was 9.7/h on AASM criteria and by CMS criteria the AHI was 3.2/h.  Snoring reached a mean  volume of 40 dB which is just at threshold and was present for less than 20% of the total sleep time.                                                 Oxygen Saturation Statistics:   Oxygen Saturation (%) Mean:    94%           Minimum  oxygen saturation (%):    Between the nadir at 88% with a maximum saturation of 98% with a total desaturation time of 0 minutes                Pulse Rate Statistics:   Pulse Mean (bpm):     77 bpm            Pulse Range:   Between 65 and 91 bpm              IMPRESSION:  This HST no longer confirms the presence of obstructive sleep apnea when CMS criteria are applied.  The overall AHI was only 2.1/h under these criteria.  On this AASM criteria this patient had an AHI of 13.1/h and would have still received a new CPAP machine.   RECOMMENDATION:    INTERPRETING PHYSICIAN:   Melvyn Novas, MD

## 2024-02-24 ENCOUNTER — Ambulatory Visit: Payer: PPO

## 2024-02-24 DIAGNOSIS — E538 Deficiency of other specified B group vitamins: Secondary | ICD-10-CM | POA: Diagnosis not present

## 2024-02-24 MED ORDER — CYANOCOBALAMIN 1000 MCG/ML IJ SOLN
1000.0000 ug | Freq: Once | INTRAMUSCULAR | Status: AC
Start: 2024-02-24 — End: 2024-02-24
  Administered 2024-02-24: 1000 ug via INTRAMUSCULAR

## 2024-02-24 NOTE — Progress Notes (Unsigned)
Pt here for monthly B12 injection per Dr. Yetta Barre  B12 given IM. and pt tolerated injection well.

## 2024-02-28 NOTE — Procedures (Signed)
 [facility]   HOME SLEEP TEST REPORT ( by Watch PAT)   STUDY DATE:  02-22-2024    ORDERING CLINICIAN:  REFERRING CLINICIAN:    CLINICAL INFORMATION/HISTORY:  2-12 -2025 visit with Valerie Dapper, NP   On BiPAP 12/ 9 cm water  residual AHI 0.3/h.  01/26/24 ALL:  Valerie [name] returns for follow up for OSA on BiPAP. She reports doing fairly well. She is using BiPAP most every night. She has some trouble falling asleep at night. She is up and down all night. Sometimes she can sleep 2-3 hours and sometimes 8-9.    MOCA was 28/30 at last visit with Dr Vickey Huger 01/2023. She feels memory is stable. No significant changes. 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 She has a grandchild that lives with her. She lost her husband in 2021. She has another son who helps her care for her son and grandchild. Her oldest son and granddaughter help her when needed.     Epworth sleepiness score:  4 /24., FSS at X/ 63 points, GDS  x/ 15 points.    BMI: 35.51 kg/m    Neck Circumference:    FINDINGS:   Sleep Summary:   Total Recording Time (hours, min):   9 hours 48 minutes  Total Sleep Time (hours, min):       7 hours 53 minutes          Percent REM (%):   23.4%                                     Respiratory Indices:   Calculated pAHI (per hour):        Following CMS criteria her current AHI is 2.1/h and REM sleep 1.1/h and in non-REM sleep 2.4/h.    If I apply AASM criteria however the patient's AHI would be 13.1/h                       REM pAHI:    By AASM criteria 6.5/h                                             NREM pAHI:   By AASM criteria 15.1/h                           Positional AHI: Supine sleep was present for 335 minutes with an AHI by AASM criteria of 14.5/h and spite CMS criteria only 1.6/h.  There were 135 minutes of sleep in the right lateral position and here the AHI was 9.7/h on AASM criteria and by CMS criteria the AHI was 3.2/h.  Snoring reached a mean  volume of 40 dB which is just at threshold and was present for less than 20% of the total sleep time.                                                 Oxygen Saturation Statistics:   Oxygen Saturation (%) Mean:    94%           Minimum  oxygen saturation (%):    Between the nadir at 88% with a maximum saturation of 98% with a total desaturation time of 0 minutes                Pulse Rate Statistics:   Pulse Mean (bpm):     77 bpm            Pulse Range:   Between 65 and 91 bpm              IMPRESSION:  This HST no longer confirms the presence of obstructive sleep apnea when CMS criteria are applied.  The overall AHI was only 2.1/h under these criteria.  On this AASM criteria this patient had an AHI of 13.1/h and would have still received a new CPAP machine.   RECOMMENDATION:    INTERPRETING PHYSICIAN:   Melvyn Novas, MD

## 2024-03-04 ENCOUNTER — Other Ambulatory Visit: Payer: Self-pay

## 2024-03-04 ENCOUNTER — Encounter (HOSPITAL_BASED_OUTPATIENT_CLINIC_OR_DEPARTMENT_OTHER): Payer: Self-pay | Admitting: Emergency Medicine

## 2024-03-04 ENCOUNTER — Emergency Department (HOSPITAL_BASED_OUTPATIENT_CLINIC_OR_DEPARTMENT_OTHER)
Admission: EM | Admit: 2024-03-04 | Discharge: 2024-03-04 | Disposition: A | Discharge status: Home / Self Care | Attending: Emergency Medicine | Admitting: Emergency Medicine

## 2024-03-04 DIAGNOSIS — N189 Chronic kidney disease, unspecified: Secondary | ICD-10-CM | POA: Insufficient documentation

## 2024-03-04 DIAGNOSIS — Z79899 Other long term (current) drug therapy: Secondary | ICD-10-CM | POA: Diagnosis not present

## 2024-03-04 DIAGNOSIS — Z7982 Long term (current) use of aspirin: Secondary | ICD-10-CM | POA: Insufficient documentation

## 2024-03-04 DIAGNOSIS — R350 Frequency of micturition: Secondary | ICD-10-CM | POA: Diagnosis present

## 2024-03-04 DIAGNOSIS — D649 Anemia, unspecified: Secondary | ICD-10-CM | POA: Diagnosis not present

## 2024-03-04 DIAGNOSIS — I129 Hypertensive chronic kidney disease with stage 1 through stage 4 chronic kidney disease, or unspecified chronic kidney disease: Secondary | ICD-10-CM | POA: Diagnosis not present

## 2024-03-04 DIAGNOSIS — E1122 Type 2 diabetes mellitus with diabetic chronic kidney disease: Secondary | ICD-10-CM | POA: Diagnosis not present

## 2024-03-04 DIAGNOSIS — N3001 Acute cystitis with hematuria: Secondary | ICD-10-CM | POA: Insufficient documentation

## 2024-03-04 LAB — CBC WITH DIFFERENTIAL/PLATELET
Abs Immature Granulocytes: 0.03 10*3/uL (ref 0.00–0.07)
Basophils Absolute: 0 10*3/uL (ref 0.0–0.1)
Basophils Relative: 0 %
Eosinophils Absolute: 0.4 10*3/uL (ref 0.0–0.5)
Eosinophils Relative: 4 %
HCT: 30 % — ABNORMAL LOW (ref 36.0–46.0)
Hemoglobin: 9.3 g/dL — ABNORMAL LOW (ref 12.0–15.0)
Immature Granulocytes: 0 %
Lymphocytes Relative: 23 %
Lymphs Abs: 2.3 10*3/uL (ref 0.7–4.0)
MCH: 29 pg (ref 26.0–34.0)
MCHC: 31 g/dL (ref 30.0–36.0)
MCV: 93.5 fL (ref 80.0–100.0)
Monocytes Absolute: 0.8 10*3/uL (ref 0.1–1.0)
Monocytes Relative: 8 %
Neutro Abs: 6.5 10*3/uL (ref 1.7–7.7)
Neutrophils Relative %: 65 %
Platelets: 369 10*3/uL (ref 150–400)
RBC: 3.21 MIL/uL — ABNORMAL LOW (ref 3.87–5.11)
RDW: 15 % (ref 11.5–15.5)
WBC: 10.2 10*3/uL (ref 4.0–10.5)
nRBC: 0.2 % (ref 0.0–0.2)

## 2024-03-04 LAB — CBG MONITORING, ED: Glucose-Capillary: 118 mg/dL — ABNORMAL HIGH (ref 70–99)

## 2024-03-04 LAB — URINALYSIS, ROUTINE W REFLEX MICROSCOPIC
Bilirubin Urine: NEGATIVE
Glucose, UA: >1000 mg/dL — AB
Ketones, ur: NEGATIVE mg/dL
Nitrite: NEGATIVE
Protein, ur: NEGATIVE mg/dL
Specific Gravity, Urine: 1.007 (ref 1.005–1.030)
WBC, UA: >50 WBC/hpf (ref 0–5)
pH: 6 (ref 5.0–8.0)

## 2024-03-04 LAB — BASIC METABOLIC PANEL
Anion gap: 9 (ref 5–15)
BUN: 12 mg/dL (ref 8–23)
CO2: 27 mmol/L (ref 22–32)
Calcium: 9.1 mg/dL (ref 8.9–10.3)
Chloride: 103 mmol/L (ref 98–111)
Creatinine, Ser: 0.95 mg/dL (ref 0.44–1.00)
GFR, Estimated: >60 mL/min (ref >60)
Glucose, Bld: 111 mg/dL — ABNORMAL HIGH (ref 70–99)
Potassium: 3.4 mmol/L — ABNORMAL LOW (ref 3.5–5.1)
Sodium: 139 mmol/L (ref 135–145)

## 2024-03-04 MED ORDER — CEPHALEXIN 500 MG PO CAPS: 500.0000 mg | ORAL_CAPSULE | Freq: Four times a day (QID) | ORAL | 0 refills | Status: DC

## 2024-03-04 MED ORDER — CEPHALEXIN 250 MG PO CAPS
500.0000 mg | ORAL_CAPSULE | Freq: Once | ORAL | Status: AC
  Administered 2024-03-04: 500 mg via ORAL
  Filled 2024-03-04: qty 2

## 2024-03-04 NOTE — ED Triage Notes (Signed)
 Patient presents with urinary frequency and dark colored urine x 3 days. Denies fever or dysuria +lower back pain

## 2024-03-04 NOTE — Discharge Instructions (Addendum)
 You were seen in the emergency department for your urinary frequency.  Does appear that you have a urinary tract infection and I am given you a course of antibiotics he should complete this as prescribed.  Your blood work also incidentally showed that you are mildly more anemic than usual and you should have this followed up by your primary doctor.  You should have your doctor reassess your symptoms in the next few days to make sure they are improving.  You should return to the emergency department if you having fevers despite the antibiotics, repetitive vomiting and unable to tolerate the antibiotics, severe abdominal or significantly worsening back pain or any other new or concerning symptoms.

## 2024-03-04 NOTE — ED Provider Notes (Signed)
 [facility] AT [facility] Provider Note   CSN: [id number] Arrival date & time: 03/04/24  2148     History  Chief Complaint  Patient presents with   Urinary Frequency    Valerie [name] is a 78 y.o. female.  Patient is a 78 year old female with a past medical history of hypertension, hyperlipidemia, diabetes, CKD presenting to the emergency department with urinary frequency.  Patient states since about Tuesday she has had increased urinary frequency and states that sometimes she has not been able to make it to the bathroom due to her urgency.  She denies any dysuria or hematuria.  She states that her urine sometimes looks dark and other times looks white.  She states that she has had some lower back pain.  She denies any abdominal pain, nausea or vomiting, fevers or chills.  She states that she has also been feeling occasionally dizzy this week.  The history is provided by the patient.  Urinary Frequency       Home Medications Prior to Admission medications   Medication Sig Start Date End Date Taking? Authorizing Provider  albuterol (PROVENTIL) (2.5 MG/3ML) 0.083% nebulizer solution Take 3 mLs (2.5 mg total) by nebulization every 6 (six) hours as needed for wheezing or shortness of breath. 04/13/22   Etta Grandchild, MD  albuterol (VENTOLIN HFA) 108 (90 Base) MCG/ACT inhaler Inhale 2 puffs into the lungs every 6 (six) hours as needed. For shortness of breath 09/08/22   Etta Grandchild, MD  aspirin 81 MG tablet Take 81 mg by mouth daily.     [provider]  atorvastatin (LIPITOR) 20 MG tablet Take 1 tablet (20 mg total) by mouth daily. 12/21/23   Etta Grandchild, MD  Blood Glucose Calibration (ONETOUCH VERIO) LIQD 1 Act by In Vitro route daily. 02/12/23   Etta Grandchild, MD  Blood Glucose Monitoring Suppl (ONETOUCH VERIO FLEX SYSTEM) DEVI 1 Act by Does not apply route 2 (two) times daily. 02/12/23   Etta Grandchild, MD  budesonide-formoterol  (SYMBICORT) 80-4.5 MCG/ACT inhaler Inhale 2 puffs into the lungs 2 (two) times daily as needed. 09/08/22   Etta Grandchild, MD  carvedilol (COREG) 25 MG tablet TAKE 1 TABLET BY MOUTH TWICE A DAY WITH FOOD 11/28/23   Etta Grandchild, MD  cephALEXin (KEFLEX) 500 MG capsule Take 1 capsule (500 mg total) by mouth 4 (four) times daily. 03/04/24   Elayne Snare K, DO  Cholecalciferol (VITAMIN D3) 50 MCG (2000 UT) capsule TAKE 1 CAPSULE BY MOUTH EVERY DAY 11/28/23   Etta Grandchild, MD  clobetasol ointment (TEMOVATE) 0.05 % Apply 1 application topically 2 (two) times daily. 09/17/20   Etta Grandchild, MD  Continuous Blood Gluc Receiver (FREESTYLE LIBRE 2 READER) DEVI 1 Act by Does not apply route daily. Patient not taking: Reported on 01/26/2024 09/08/22   Etta Grandchild, MD  Continuous Blood Gluc Sensor (FREESTYLE LIBRE 2 SENSOR) MISC 1 Act by Does not apply route daily. Patient not taking: Reported on 01/26/2024 09/08/22   Etta Grandchild, MD  famotidine (PEPCID) 40 MG tablet TAKE 1 TABLET BY MOUTH EVERY DAY 12/24/23   Etta Grandchild, MD  ferrous sulfate 325 (65 FE) MG tablet TAKE 1 TABLET BY MOUTH 3 TIMES DAILY WITH MEALS. 11/10/23   Etta Grandchild, MD  Finerenone (KERENDIA) 20 MG TABS Take 1 tablet (20 mg total) by mouth daily. Via [employer] and safety inspector) 08/19/23   Etta Grandchild, MD  gabapentin (NEURONTIN) 300 MG capsule TAKE 1 CAPSULE BY MOUTH THREE TIMES A DAY 09/08/22   Etta Grandchild, MD  glucose blood (FREESTYLE TEST STRIPS) test strip USE TO TEST BLOOD SUGAR 3 TIMES A DAY. DX: E11.8 02/06/21   Etta Grandchild, MD  irbesartan (AVAPRO) 300 MG tablet Take 0.5 tablets (150 mg total) by mouth daily. Patient taking differently: Take 150 mg by mouth daily as needed. 09/08/22   Etta Grandchild, MD  isosorbide mononitrate (IMDUR) 30 MG 24 hr tablet Take 0.5 tablets (15 mg total) by mouth daily. 12/13/23   Joylene Grapes, NP  latanoprost (XALATAN) 0.005 % ophthalmic solution Place 1 drop into both eyes  at bedtime.     [provider]  ONETOUCH VERIO test strip 1 EACH BY OTHER ROUTE 2 (TWO) TIMES DAILY. USE AS INSTRUCTED 08/13/23   Etta Grandchild, MD  oxyCODONE-acetaminophen (PERCOCET/ROXICET) 5-325 MG tablet Take 1 tablet by mouth every 8 (eight) hours as needed for severe pain (pain score 7-10). 01/03/24   Etta Grandchild, MD  pantoprazole (PROTONIX) 40 MG tablet TAKE 1 TABLET BY MOUTH 2 TIMES DAILY BEFORE A MEAL 01/13/24   Etta Grandchild, MD  potassium chloride SA (KLOR-CON M) 20 MEQ tablet TAKE 1 TABLET BY MOUTH EVERY DAY 02/14/24   Etta Grandchild, MD  SYNJARDY 12.5-500 MG TABS TAKE 1 TABLET BY MOUTH 2 (TWO) TIMES DAILY. TAKE 2 TABLETS DAILY. 08/13/23   Etta Grandchild, MD  TRULICITY 1.5 MG/0.5ML SOAJ INJECT 1.5 MG (0.5ML) UNDER THE SKIN ONCE A WEEK 10/10/23   Etta Grandchild, MD      Allergies    Food and Penicillins    Review of Systems   Review of Systems  Genitourinary:  Positive for frequency.    Physical Exam Updated Vital Signs BP (!) 181/79 (BP Location: Right Arm)   Pulse 82   Temp 97.7 F (36.5 C)   Resp 18   Ht 5\' 6"  (1.676 m)   SpO2 100%   BMI 35.51 kg/m  Physical Exam Vitals and nursing note reviewed.  Constitutional:      General: She is not in acute distress.    Appearance: Normal appearance. She is obese.  HENT:     Head: Normocephalic and atraumatic.     Nose: Nose normal.     Mouth/Throat:     Mouth: Mucous membranes are moist.     Pharynx: Oropharynx is clear.  Eyes:     Extraocular Movements: Extraocular movements intact.     Conjunctiva/sclera: Conjunctivae normal.  Cardiovascular:     Rate and Rhythm: Normal rate and regular rhythm.     Heart sounds: Normal heart sounds.  Pulmonary:     Effort: Pulmonary effort is normal.     Breath sounds: Normal breath sounds.  Abdominal:     General: Abdomen is flat.     Palpations: Abdomen is soft.     Tenderness: There is no abdominal tenderness. There is no right CVA tenderness, left CVA  tenderness or guarding.  Musculoskeletal:        General: Normal range of motion.     Cervical back: Normal range of motion.     Comments: No midline back tenderness, no reproducible paraspinal muscle tenderness on exam  Skin:    General: Skin is warm and dry.  Neurological:     General: No focal deficit present.     Mental Status: She is alert and oriented to person, place, and time.  Psychiatric:        Mood and Affect: Mood normal.        Behavior: Behavior normal.     ED Results / Procedures / Treatments   Labs (all labs ordered are listed, but only abnormal results are displayed) Labs Reviewed  URINALYSIS, ROUTINE W REFLEX MICROSCOPIC - Abnormal; Notable for the following components:      Result Value   APPearance HAZY (*)    Glucose, UA >1,000 (*)    Hgb urine dipstick LARGE (*)    Leukocytes,Ua LARGE (*)    Bacteria, UA RARE (*)    All other components within normal limits  BASIC METABOLIC PANEL - Abnormal; Notable for the following components:   Potassium 3.4 (*)    Glucose, Bld 111 (*)    All other components within normal limits  CBC WITH DIFFERENTIAL/PLATELET - Abnormal; Notable for the following components:   RBC 3.21 (*)    Hemoglobin 9.3 (*)    HCT 30.0 (*)    All other components within normal limits  CBG MONITORING, ED - Abnormal; Notable for the following components:   Glucose-Capillary 118 (*)    All other components within normal limits    EKG None  Radiology No results found.  Procedures Procedures    Medications Ordered in ED Medications  cephALEXin (KEFLEX) capsule 500 mg (has no administration in time range)    ED Course/ Medical Decision Making/ A&P Clinical Course as of 03/04/24 2306  Sat Mar 04, 2024  2224 UA with rare bacteria and squams but does have large leuks, RBCs and with urinary symptoms will treat for UTI. [VK]  2242 Acute on chronic anemia on labs. No report of any bleeding. [VK]    Clinical Course User Index [VK]  Rexford Maus, DO                                 Medical Decision Making This patient presents to the ED with chief complaint(s) of urinary frequency with pertinent past medical history of hypertension, diabetes, CKD which further complicates the presenting complaint. The complaint involves an extensive differential diagnosis and also carries with it a high risk of complications and morbidity.    The differential diagnosis includes UTI, hyperglycemia glucose urea, dehydration, electrolyte abnormality, no CVA tenderness making pyelonephritis unlikely, no signs of sepsis on exam  Additional history obtained: Additional history obtained from N/A Records reviewed Primary Care Documents  ED Course and Reassessment: On patient's arrival she is hypertensive but otherwise hemodynamically stable in no acute distress.  Had urine ordered in triage that is pending at time.  Will additionally have glucose and labs performed.  She will be closely reassessed.  Independent labs interpretation:  The following labs were independently interpreted: large WBCs and RBCs with rare bacteria concerning for UTI, mildly worsening anemia from baseline  Independent visualization of imaging: - N/A  Consultation: - Consulted or discussed management/test interpretation w/ external professional: N/A  Consideration for admission or further workup: Patient has no emergent conditions requiring admission or further work-up at this time and is stable for discharge home with primary care follow-up  Social Determinants of health: N/A    Amount and/or Complexity of Data Reviewed Labs: ordered.  Risk Prescription drug management.          Final Clinical Impression(s) / ED Diagnoses Final diagnoses:  Acute cystitis with hematuria  Anemia, unspecified type    Rx /  DC Orders ED Discharge Orders          Ordered    cephALEXin (KEFLEX) 500 MG capsule  4 times daily,   Status:  Discontinued         03/04/24 2256    cephALEXin (KEFLEX) 500 MG capsule  4 times daily        03/04/24 2305              Rexford Maus, DO 03/04/24 2307

## 2024-03-08 ENCOUNTER — Ambulatory Visit (INDEPENDENT_AMBULATORY_CARE_PROVIDER_SITE_OTHER): Admitting: Internal Medicine

## 2024-03-08 ENCOUNTER — Encounter: Payer: Self-pay | Admitting: Internal Medicine

## 2024-03-08 VITALS — BP 142/88 | HR 76 | Temp 98.0°F | Resp 16 | Ht 66.0 in | Wt 220.2 lb

## 2024-03-08 DIAGNOSIS — D539 Nutritional anemia, unspecified: Secondary | ICD-10-CM

## 2024-03-08 DIAGNOSIS — Z7984 Long term (current) use of oral hypoglycemic drugs: Secondary | ICD-10-CM | POA: Diagnosis not present

## 2024-03-08 DIAGNOSIS — E876 Hypokalemia: Secondary | ICD-10-CM

## 2024-03-08 DIAGNOSIS — D510 Vitamin B12 deficiency anemia due to intrinsic factor deficiency: Secondary | ICD-10-CM

## 2024-03-08 DIAGNOSIS — E1122 Type 2 diabetes mellitus with diabetic chronic kidney disease: Secondary | ICD-10-CM | POA: Diagnosis not present

## 2024-03-08 DIAGNOSIS — N1832 Chronic kidney disease, stage 3b: Secondary | ICD-10-CM

## 2024-03-08 DIAGNOSIS — Z7985 Long-term (current) use of injectable non-insulin antidiabetic drugs: Secondary | ICD-10-CM

## 2024-03-08 DIAGNOSIS — N3001 Acute cystitis with hematuria: Secondary | ICD-10-CM

## 2024-03-08 DIAGNOSIS — E118 Type 2 diabetes mellitus with unspecified complications: Secondary | ICD-10-CM

## 2024-03-08 DIAGNOSIS — T502X5A Adverse effect of carbonic-anhydrase inhibitors, benzothiadiazides and other diuretics, initial encounter: Secondary | ICD-10-CM | POA: Diagnosis not present

## 2024-03-08 DIAGNOSIS — I10 Essential (primary) hypertension: Secondary | ICD-10-CM | POA: Diagnosis not present

## 2024-03-08 DIAGNOSIS — N1831 Chronic kidney disease, stage 3a: Secondary | ICD-10-CM

## 2024-03-08 LAB — CBC WITH DIFFERENTIAL/PLATELET
Basophils Absolute: 0.1 10*3/uL (ref 0.0–0.1)
Basophils Relative: 0.5 % (ref 0.0–3.0)
Eosinophils Absolute: 0.5 10*3/uL (ref 0.0–0.7)
Eosinophils Relative: 5.5 % — ABNORMAL HIGH (ref 0.0–5.0)
HCT: 30.3 % — ABNORMAL LOW (ref 36.0–46.0)
Hemoglobin: 9.8 g/dL — ABNORMAL LOW (ref 12.0–15.0)
Lymphocytes Relative: 28.6 % (ref 12.0–46.0)
Lymphs Abs: 2.6 10*3/uL (ref 0.7–4.0)
MCHC: 32.2 g/dL (ref 30.0–36.0)
MCV: 90.1 fl (ref 78.0–100.0)
Monocytes Absolute: 1 10*3/uL (ref 0.1–1.0)
Monocytes Relative: 10.5 % (ref 3.0–12.0)
Neutro Abs: 5.1 10*3/uL (ref 1.4–7.7)
Neutrophils Relative %: 54.9 % (ref 43.0–77.0)
Platelets: 381 10*3/uL (ref 150.0–400.0)
RBC: 3.36 Mil/uL — ABNORMAL LOW (ref 3.87–5.11)
RDW: 15.8 % — ABNORMAL HIGH (ref 11.5–15.5)
WBC: 9.3 10*3/uL (ref 4.0–10.5)

## 2024-03-08 LAB — URINALYSIS, ROUTINE W REFLEX MICROSCOPIC
Bilirubin Urine: NEGATIVE
Hgb urine dipstick: NEGATIVE
Ketones, ur: NEGATIVE
Nitrite: NEGATIVE
RBC / HPF: NONE SEEN (ref 0–?)
Specific Gravity, Urine: 1.01 (ref 1.000–1.030)
Total Protein, Urine: NEGATIVE
Urine Glucose: >=1000 — AB
Urobilinogen, UA: 0.2 (ref 0.0–1.0)
pH: 6 (ref 5.0–8.0)

## 2024-03-08 LAB — BASIC METABOLIC PANEL WITH GFR
BUN: 10 mg/dL (ref 6–23)
CO2: 30 meq/L (ref 19–32)
Calcium: 9.4 mg/dL (ref 8.4–10.5)
Chloride: 104 meq/L (ref 96–112)
Creatinine, Ser: 0.94 mg/dL (ref 0.40–1.20)
GFR: 59.15 mL/min — ABNORMAL LOW (ref >60.00)
Glucose, Bld: 103 mg/dL — ABNORMAL HIGH (ref 70–99)
Potassium: 4.1 meq/L (ref 3.5–5.1)
Sodium: 141 meq/L (ref 135–145)

## 2024-03-08 LAB — MAGNESIUM: Magnesium: 1.8 mg/dL (ref 1.5–2.5)

## 2024-03-08 NOTE — Patient Instructions (Signed)

## 2024-03-08 NOTE — Progress Notes (Signed)
 Subjective:  Patient ID: Valerie [name], female    DOB: 30-Nov-1946  Age: 78 y.o. MRN: [id number]  CC: Urinary Tract Infection, Anemia, and Hypertension   HPI Valerie [name] presents for f/up ----  Discussed the use of AI scribe software for clinical note transcription with the patient, who gave verbal consent to proceed.  History of Present Illness   Valerie [name] is a 78 year old female who presents with persistent urinary symptoms following a recent bladder infection.  She experiences a continuous sensation of needing to urinate and increased frequency, although urination is not painful. She is currently taking Keflex, an antibiotic prescribed for her bladder infection, and has no side effects such as rash, nausea, vomiting, or diarrhea.  She feels generally unwell and 'kind of wobbly'. No fever or chills, but she has headaches.  She has chronic lower back pain.  She has experienced some chest pain, which is not new or different from previous episodes.  She reports confusion during her recent illness, noting an inability to recall her age at one point. Her blood pressure was elevated at home, with a reading of 186/unknown, but it was 142/88 during the visit.       Outpatient Medications Prior to Visit  Medication Sig Dispense Refill   albuterol (PROVENTIL) (2.5 MG/3ML) 0.083% nebulizer solution Take 3 mLs (2.5 mg total) by nebulization every 6 (six) hours as needed for wheezing or shortness of breath. 75 mL 3   albuterol (VENTOLIN HFA) 108 (90 Base) MCG/ACT inhaler Inhale 2 puffs into the lungs every 6 (six) hours as needed. For shortness of breath 3 each 4   aspirin 81 MG tablet Take 81 mg by mouth daily.      atorvastatin (LIPITOR) 20 MG tablet Take 1 tablet (20 mg total) by mouth daily. 90 tablet 0   Blood Glucose Calibration (ONETOUCH VERIO) LIQD 1 Act by In Vitro route daily. 1 each 5   Blood Glucose Monitoring Suppl (ONETOUCH VERIO FLEX SYSTEM) DEVI 1 Act by Does not  apply route 2 (two) times daily. 1 each 3   budesonide-formoterol (SYMBICORT) 80-4.5 MCG/ACT inhaler Inhale 2 puffs into the lungs 2 (two) times daily as needed. 3 each 1   carvedilol (COREG) 25 MG tablet TAKE 1 TABLET BY MOUTH TWICE A DAY WITH FOOD 180 tablet 0   cephALEXin (KEFLEX) 500 MG capsule Take 1 capsule (500 mg total) by mouth 4 (four) times daily. 20 capsule 0   Cholecalciferol (VITAMIN D3) 50 MCG (2000 UT) capsule TAKE 1 CAPSULE BY MOUTH EVERY DAY 90 capsule 1   clobetasol ointment (TEMOVATE) 0.05 % Apply 1 application topically 2 (two) times daily. 60 g 1   famotidine (PEPCID) 40 MG tablet TAKE 1 TABLET BY MOUTH EVERY DAY 90 tablet 1   ferrous sulfate 325 (65 FE) MG tablet TAKE 1 TABLET BY MOUTH 3 TIMES DAILY WITH MEALS. 270 tablet 0   Finerenone (KERENDIA) 20 MG TABS Take 1 tablet (20 mg total) by mouth daily. 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 DX: E11.8 300 strip 1   irbesartan (AVAPRO) 300 MG tablet Take 0.5 tablets (150 mg total) by mouth daily. (Patient taking differently: Take 150 mg by mouth daily as needed.) 45 tablet 1   isosorbide mononitrate (  IMDUR) 30 MG 24 hr tablet Take 0.5 tablets (15 mg total) by mouth daily. 45 tablet 3   latanoprost (XALATAN) 0.005 % ophthalmic solution Place 1 drop into both eyes at bedtime.      ONETOUCH VERIO test strip 1 EACH BY OTHER ROUTE 2 (TWO) TIMES DAILY. USE AS INSTRUCTED 200 strip 1   oxyCODONE-acetaminophen (PERCOCET/ROXICET) 5-325 MG tablet Take 1 tablet by mouth every 8 (eight) hours as needed for severe pain (pain score 7-10). 90 tablet 0   pantoprazole (PROTONIX) 40 MG tablet TAKE 1 TABLET BY MOUTH 2 TIMES DAILY BEFORE A MEAL 180 tablet 0   potassium chloride SA (KLOR-CON M) 20 MEQ tablet TAKE 1 TABLET BY MOUTH EVERY DAY 90 tablet 0   SYNJARDY  12.5-500 MG TABS TAKE 1 TABLET BY MOUTH 2 (TWO) TIMES DAILY. TAKE 2 TABLETS DAILY. 180 tablet 1   TRULICITY 1.5 MG/0.5ML SOAJ INJECT 1.5 MG (0.5ML) UNDER THE SKIN ONCE A WEEK 6 mL 0   Continuous Blood Gluc Receiver (FREESTYLE LIBRE 2 READER) DEVI 1 Act by Does not apply route daily. 2 each 5   Continuous Blood Gluc Sensor (FREESTYLE LIBRE 2 SENSOR) MISC 1 Act by Does not apply route daily. 2 each 5   No facility-administered medications prior to visit.    ROS Review of Systems  Constitutional: Negative.  Negative for appetite change, chills, fatigue and fever.  HENT: Negative.    Eyes: Negative.   Respiratory: Negative.  Negative for cough, chest tightness, shortness of breath and wheezing.   Cardiovascular:  Negative for chest pain, palpitations and leg swelling.  Gastrointestinal: Negative.  Negative for abdominal pain, constipation, diarrhea, nausea and vomiting.  Endocrine: Negative.  Negative for polydipsia, polyphagia and polyuria.  Genitourinary:  Positive for frequency and urgency. Negative for decreased urine volume, difficulty urinating, hematuria and pelvic pain.  Musculoskeletal:  Positive for back pain. Negative for myalgias.  Skin: Negative.   Neurological: Negative.  Negative for dizziness and weakness.  Hematological:  Negative for adenopathy. Does not bruise/bleed easily.  Psychiatric/Behavioral:  Positive for confusion and decreased concentration. Negative for sleep disturbance. The patient is not nervous/anxious.     Objective:  BP (!) 142/88 (BP Location: Left Arm, Patient Position: Sitting, Cuff Size: Large)   Pulse 76   Temp 98 F (36.7 C) (Oral)   Resp 16   Ht 5\' 6"  (1.676 m)   Wt 220 lb 3.2 oz (99.9 kg)   SpO2 98%   BMI 35.54 kg/m   BP Readings from Last 3 Encounters:  03/08/24 (!) 142/88  03/04/24 (!) 181/79  01/26/24 (!) 154/92    Wt Readings from Last 3 Encounters:  03/08/24 220 lb 3.2 oz (99.9 kg)  01/26/24 220 lb (99.8 kg)  12/21/23 218 lb  12.8 oz (99.2 kg)    Physical Exam Vitals reviewed.  Constitutional:      Appearance: Normal appearance.  HENT:     Mouth/Throat:     Mouth: Mucous membranes are moist.  Eyes:     General: No scleral icterus.    Conjunctiva/sclera: Conjunctivae normal.  Cardiovascular:     Rate and Rhythm: Normal rate and regular rhythm.     Heart sounds: No murmur heard.    No friction rub. No gallop.     Comments: EKG--- NSR, 72 bpm No LVH, Q waves, or ST/T wave changes  Pulmonary:     Effort: Pulmonary effort is normal.     Breath sounds: No stridor. No wheezing, rhonchi or rales.  Abdominal:     General: Abdomen is protuberant. Bowel sounds are normal. There is no distension.     Palpations: Abdomen is soft. There is no hepatomegaly, splenomegaly or mass.     Tenderness: There is no abdominal tenderness.  Musculoskeletal:     Cervical back: Normal range of motion and neck supple.     Right lower leg: No edema.     Left lower leg: No edema.  Skin:    General: Skin is warm and dry.  Neurological:     General: No focal deficit present.     Mental Status: She is alert. Mental status is at baseline.  Psychiatric:        Mood and Affect: Mood normal.        Behavior: Behavior normal.     Lab Results  Component Value Date   WBC 9.3 03/08/2024   HGB 9.8 (L) 03/08/2024   HCT 30.3 (L) 03/08/2024   PLT 381.0 03/08/2024   GLUCOSE 103 (H) 03/08/2024   CHOL 152 05/11/2023   TRIG 253.0 (H) 05/11/2023   HDL 48.90 05/11/2023   LDLDIRECT 60.0 05/11/2023   LDLCALC 56 05/05/2022   ALT 15 12/21/2023   AST 17 12/21/2023   NA 141 03/08/2024   K 4.1 03/08/2024   CL 104 03/08/2024   CREATININE 0.94 03/08/2024   BUN 10 03/08/2024   CO2 30 03/08/2024   TSH 3.00 02/09/2023   INR 1.1 (H) 04/10/2021   HGBA1C 6.4 12/21/2023   MICROALBUR 0.9 08/18/2023    No results found.  Assessment & Plan:   Essential hypertension, benign- Her BP is adequately well controlled. EKG is negative for  LVH. -     EKG 12-Lead -     Urinalysis, Routine w reflex microscopic; Future  Vitamin B12 deficiency anemia due to intrinsic factor deficiency -     CBC with Differential/Platelet; Future -     Folate; Future  Acute cystitis with hematuria- urine culture is negative. -     Urinalysis, Routine w reflex microscopic; Future -     CULTURE, URINE COMPREHENSIVE; Future  Deficiency anemia- Vit levels are normal. -     Zinc; Future -     Vitamin B1; Future -     Reticulocytes; Future -     IBC + Ferritin; Future  Diuretic-induced hypokalemia -     Magnesium; Future -     Basic metabolic panel with GFR; Future  Other orders -     EKG     Follow-up: Return in about 3 months (around 06/08/2024).  Sanda Linger, MD

## 2024-03-09 LAB — IBC + FERRITIN
Ferritin: 261.9 ng/mL (ref 10.0–291.0)
Iron: 73 ug/dL (ref 42–145)
Saturation Ratios: 23.5 % (ref 20.0–50.0)
TIBC: 310.8 ug/dL (ref 250.0–450.0)
Transferrin: 222 mg/dL (ref 212.0–360.0)

## 2024-03-09 LAB — FOLATE: Folate: 7.2 ng/mL (ref >5.9)

## 2024-03-12 ENCOUNTER — Encounter: Payer: Self-pay | Admitting: Internal Medicine

## 2024-03-12 LAB — CULTURE, URINE COMPREHENSIVE

## 2024-03-12 LAB — RETICULOCYTES
ABS Retic: 106240 {cells}/uL — ABNORMAL HIGH (ref 20000–80000)
Retic Ct Pct: 3.2 %

## 2024-03-12 LAB — VITAMIN B1: Vitamin B1 (Thiamine): 13 nmol/L (ref 8–30)

## 2024-03-12 LAB — ZINC: Zinc: 66 ug/dL (ref 60–130)

## 2024-03-12 MED ORDER — KERENDIA 20 MG PO TABS: 1.0000 | ORAL_TABLET | Freq: Every day | ORAL | 1 refills | Status: AC

## 2024-03-13 ENCOUNTER — Other Ambulatory Visit (HOSPITAL_COMMUNITY): Payer: Self-pay

## 2024-03-13 ENCOUNTER — Telehealth: Payer: Self-pay

## 2024-03-13 NOTE — Telephone Encounter (Signed)
 Pharmacy Patient Advocate Encounter   Received notification from CoverMyMeds that prior authorization for Kerendia 20MG  tablets is required/requested.   Insurance verification completed.   The patient is insured through [facility] ADVANTAGE/RX ADVANCE .   Per test claim: PA required; PA started via CoverMyMeds. KEY [id number] . Waiting for clinical questions to populate.

## 2024-03-14 ENCOUNTER — Ambulatory Visit (INDEPENDENT_AMBULATORY_CARE_PROVIDER_SITE_OTHER): Payer: PPO

## 2024-03-14 ENCOUNTER — Ambulatory Visit: Admitting: Internal Medicine

## 2024-03-14 VITALS — BP 128/62 | Ht 65.0 in | Wt 217.4 lb

## 2024-03-14 DIAGNOSIS — Z0000 Encounter for general adult medical examination without abnormal findings: Secondary | ICD-10-CM | POA: Diagnosis not present

## 2024-03-14 NOTE — Patient Instructions (Addendum)
 Valerie [name] , Thank you for taking time to come for your Medicare Wellness Visit. I appreciate your ongoing commitment to your health goals. Please review the following plan we discussed and let me know if I can assist you in the future.   Referrals/Orders/Follow-Ups/Clinician Recommendations: Aim for 30 minutes of exercise or brisk walking, 6-8 glasses of water, and 5 servings of fruits and vegetables each day.   This is a list of the screening recommended for you and due dates:  Health Maintenance  Topic Date Due   COVID-19 Vaccine (4 - 2024-25 season) 08/15/2023   Eye exam for diabetics  08/27/2023   Hemoglobin A1C  06/19/2024   Flu Shot  07/14/2024   Yearly kidney health urinalysis for diabetes  08/17/2024   Complete foot exam   08/17/2024   Yearly kidney function blood test for diabetes  03/08/2025   Medicare Annual Wellness Visit  03/14/2025   DTaP/Tdap/Td vaccine (3 - Td or Tdap) 06/03/2027   Colon Cancer Screening  04/21/2028   Pneumonia Vaccine  Completed   DEXA scan (bone density measurement)  Completed   Hepatitis C Screening  Completed   Zoster (Shingles) Vaccine  Completed   HPV Vaccine  Aged Out    Advanced directives: (Copy Requested) Please bring a copy of your health care power of attorney and living will to the office to be added to your chart at your convenience. You can mail to [facility] [id number] [place], [place] [address] or email to [email or web address]  Next Medicare Annual Wellness Visit scheduled for next year: Yes   Managing Pain Without Opioids Opioids are strong medicines used to treat moderate to severe pain. For some people, especially those who have long-term (chronic) pain, opioids may not be the best choice for pain management due to: Side effects like nausea, constipation, and sleepiness. The risk of addiction (opioid use disorder). The longer you take opioids, the greater your risk of addiction. Pain that lasts for more  than 3 months is called chronic pain. Managing chronic pain usually requires more than one approach and is often provided by a team of health care providers working together (multidisciplinary approach). Pain management may be done at a pain management center or pain clinic. How to manage pain without the use of opioids Use non-opioid medicines Non-opioid medicines for pain may include: Over-the-counter or prescription non-steroidal anti-inflammatory drugs (NSAIDs). These may be the first medicines used for pain. They work well for muscle and bone pain, and they reduce swelling. Acetaminophen. This over-the-counter medicine may work well for milder pain but not swelling. Antidepressants. These may be used to treat chronic pain. A certain type of antidepressant (tricyclics) is often used. These medicines are given in lower doses for pain than when used for depression. Anticonvulsants. These are usually used to treat seizures but may also reduce nerve (neuropathic) pain. Muscle relaxants. These relieve pain caused by sudden muscle tightening (spasms). You may also use a pain medicine that is applied to the skin as a patch, cream, or gel (topical analgesic), such as a numbing medicine. These may cause fewer side effects than medicines taken by mouth. Do certain therapies as directed Some therapies can help with pain management. They include: Physical therapy. You will do exercises to gain strength and flexibility. A physical therapist may teach you exercises to move and stretch parts of your body that are weak, stiff, or painful. You can learn these exercises at physical therapy visits and practice them  at home. Physical therapy may also involve: Massage. Heat wraps or applying heat or cold to affected areas. Electrical signals that interrupt pain signals (transcutaneous electrical nerve stimulation, TENS). Weak lasers that reduce pain and swelling (low-level laser therapy). Signals from your body that  help you learn to regulate pain (biofeedback). Occupational therapy. This helps you to learn ways to function at home and work with less pain. Recreational therapy. This involves trying new activities or hobbies, such as a physical activity or drawing. Mental health therapy, including: Cognitive behavioral therapy (CBT). This helps you learn coping skills for dealing with pain. Acceptance and commitment therapy (ACT) to change the way you think and react to pain. Relaxation therapies, including muscle relaxation exercises and mindfulness-based stress reduction. Pain management counseling. This may be individual, family, or group counseling.  Receive medical treatments Medical treatments for pain management include: Nerve block injections. These may include a pain blocker and anti-inflammatory medicines. You may have injections: Near the spine to relieve chronic back or neck pain. Into joints to relieve back or joint pain. Into nerve areas that supply a painful area to relieve body pain. Into muscles (trigger point injections) to relieve some painful muscle conditions. A medical device placed near your spine to help block pain signals and relieve nerve pain or chronic back pain (spinal cord stimulation device). Acupuncture. Follow these instructions at home Medicines Take over-the-counter and prescription medicines only as told by your health care provider. If you are taking pain medicine, ask your health care providers about possible side effects to watch out for. Do not drive or use heavy machinery while taking prescription opioid pain medicine. Lifestyle  Do not use drugs or alcohol to reduce pain. If you drink alcohol, limit how much you have to: 0-1 drink a day for women who are not pregnant. 0-2 drinks a day for men. Know how much alcohol is in a drink. In the [place] one drink equals one 12 oz bottle of beer (355 mL), one 5 oz glass of wine (148 mL), or one 1 oz glass of hard liquor  (44 mL). Do not use any products that contain nicotine or tobacco. These products include cigarettes, chewing tobacco, and vaping devices, such as e-cigarettes. If you need help quitting, ask your health care provider. Eat a healthy diet and maintain a healthy weight. Poor diet and excess weight may make pain worse. Eat foods that are high in fiber. These include fresh fruits and vegetables, whole grains, and beans. Limit foods that are high in fat and processed sugars, such as fried and sweet foods. Exercise regularly. Exercise lowers stress and may help relieve pain. Ask your health care provider what activities and exercises are safe for you. If your health care provider approves, join an exercise class that combines movement and stress reduction. Examples include yoga and tai chi. Get enough sleep. Lack of sleep may make pain worse. Lower stress as much as possible. Practice stress reduction techniques as told by your therapist. General instructions Work with all your pain management providers to find the treatments that work best for you. You are an important member of your pain management team. There are many things you can do to reduce pain on your own. Consider joining an online or in-person support group for people who have chronic pain. Keep all follow-up visits. This is important. Where to find more information You can find more information about managing pain without opioids from: American Academy of Pain Medicine: painmed.org Institute for Chronic  Pain: instituteforchronicpain.org American Chronic Pain Association: theacpa.org Contact a health care provider if: You have side effects from pain medicine. Your pain gets worse or does not get better with treatments or home therapy. You are struggling with anxiety or depression. Summary Many types of pain can be managed without opioids. Chronic pain may respond better to pain management without opioids. Pain is best managed when you  and a team of health care providers work together. Pain management without opioids may include non-opioid medicines, medical treatments, physical therapy, mental health therapy, and lifestyle changes. Tell your health care providers if your pain gets worse or is not being managed well enough. This information is not intended to replace advice given to you by your health care provider. Make sure you discuss any questions you have with your health care provider. Document Revised: 03/12/2021 Document Reviewed: 03/12/2021 Elsevier Patient Education  2024 [employer].

## 2024-03-14 NOTE — Progress Notes (Signed)
 Subjective:   Valerie [name] is a 78 y.o. who presents for a Medicare Wellness preventive visit.  Visit Complete: In person  Persons Participating in Visit: Patient.  AWV Questionnaire: No: Patient Medicare AWV questionnaire was not completed prior to this visit.  Cardiac Risk Factors include: advanced age (>86men, >81 women);diabetes mellitus;dyslipidemia;hypertension;obesity (BMI >30kg/m2)     Objective:    Today's Vitals   03/14/24 1531  BP: 128/62  Weight: 217 lb 6.4 oz (98.6 kg)  Height: 5\' 5"  (1.651 m)   Body mass index is 36.18 kg/m.     03/14/2024    3:28 PM 02/10/2023   11:15 AM 07/28/2022    3:01 PM 02/27/2021   11:10 AM 03/18/2020   12:41 PM 03/08/2020   11:29 AM 03/17/2019    1:19 PM  Advanced Directives  Does Patient Have a Medical Advance Directive? Yes Yes No Yes No No No  Type of Estate agent of [place];Living will Healthcare Power of [employer];Living will       Does patient want to make changes to medical advance directive?    No - Patient declined No - Patient declined    Copy of Healthcare Power of Attorney in Chart? No - copy requested No - copy requested       Would patient like information on creating a medical advance directive?     No - Patient declined Yes (MAU/Ambulatory/Procedural Areas - Information given) No - Guardian declined    Current Medications (verified) Outpatient Encounter Medications as of 03/14/2024  Medication Sig   albuterol (PROVENTIL) (2.5 MG/3ML) 0.083% nebulizer solution Take 3 mLs (2.5 mg total) by nebulization every 6 (six) hours as needed for wheezing or shortness of breath.   albuterol (VENTOLIN HFA) 108 (90 Base) MCG/ACT inhaler Inhale 2 puffs into the lungs every 6 (six) hours as needed. For shortness of breath   aspirin 81 MG tablet Take 81 mg by mouth daily.    atorvastatin (LIPITOR) 20 MG tablet Take 1 tablet (20 mg total) by mouth daily.   Blood Glucose Calibration (ONETOUCH VERIO) LIQD 1 Act by In  Vitro route daily.   Blood Glucose Monitoring Suppl (ONETOUCH VERIO FLEX SYSTEM) DEVI 1 Act by Does not apply route 2 (two) times daily.   budesonide-formoterol (SYMBICORT) 80-4.5 MCG/ACT inhaler Inhale 2 puffs into the lungs 2 (two) times daily as needed.   carvedilol (COREG) 25 MG tablet TAKE 1 TABLET BY MOUTH TWICE A DAY WITH FOOD   Cholecalciferol (VITAMIN D3) 50 MCG (2000 UT) capsule TAKE 1 CAPSULE BY MOUTH EVERY DAY   clobetasol ointment (TEMOVATE) 0.05 % Apply 1 application topically 2 (two) times daily.   famotidine (PEPCID) 40 MG tablet TAKE 1 TABLET BY MOUTH EVERY DAY   ferrous sulfate 325 (65 FE) MG tablet TAKE 1 TABLET BY MOUTH 3 TIMES DAILY WITH MEALS.   Finerenone (KERENDIA) 20 MG TABS Take 1 tablet (20 mg total) by mouth daily. Via Patient Assistance Health and safety inspector)   gabapentin (NEURONTIN) 300 MG capsule TAKE 1 CAPSULE BY MOUTH THREE TIMES A DAY   glucose blood (FREESTYLE TEST STRIPS) test strip USE TO TEST BLOOD SUGAR 3 TIMES A DAY. DX: E11.8   irbesartan (AVAPRO) 300 MG tablet Take 0.5 tablets (150 mg total) by mouth daily. (Patient taking differently: Take 150 mg by mouth daily as needed.)   isosorbide mononitrate (IMDUR) 30 MG 24 hr tablet Take 0.5 tablets (15 mg total) by mouth daily.   latanoprost (XALATAN) 0.005 % ophthalmic solution  Place 1 drop into both eyes at bedtime.    ONETOUCH VERIO test strip 1 EACH BY OTHER ROUTE 2 (TWO) TIMES DAILY. USE AS INSTRUCTED   oxyCODONE-acetaminophen (PERCOCET/ROXICET) 5-325 MG tablet Take 1 tablet by mouth every 8 (eight) hours as needed for severe pain (pain score 7-10).   pantoprazole (PROTONIX) 40 MG tablet TAKE 1 TABLET BY MOUTH 2 TIMES DAILY BEFORE A MEAL   potassium chloride SA (KLOR-CON M) 20 MEQ tablet TAKE 1 TABLET BY MOUTH EVERY DAY   SYNJARDY 12.5-500 MG TABS TAKE 1 TABLET BY MOUTH 2 (TWO) TIMES DAILY. TAKE 2 TABLETS DAILY.   TRULICITY 1.5 MG/0.5ML SOAJ INJECT 1.5 MG (0.5ML) UNDER THE SKIN ONCE A WEEK   [DISCONTINUED] cephALEXin  (KEFLEX) 500 MG capsule Take 1 capsule (500 mg total) by mouth 4 (four) times daily.   No facility-administered encounter medications on file as of 03/14/2024.    Allergies (verified) Food and Penicillins   History: Past Medical History:  Diagnosis Date   Anemia    Anxiety and depression    Arthritis    Asthma    Cataract    Degenerative arthritis    Depression    Diabetes mellitus, type 2 (HCC)    Fatigue    GERD (gastroesophageal reflux disease)    Glaucoma    Heart palpitations    Hemorrhoids    Hyperlipidemia    Hypertension    Hypothyroidism    Hypoxemia 11/24/2013   Memory deficit 10/04/2013   Obesity    Sleep apnea    BiPAP   Snoring disorder    Past Surgical History:  Procedure Laterality Date   benign tumors resected     CATARACT EXTRACTION Bilateral    FOOT SURGERY Left    bone spur   ROTATOR CUFF REPAIR Left    TUBAL LIGATION     Family History  Problem Relation Age of Onset   Alcohol abuse Mother    Heart attack Father    Hypertension Father    Hyperlipidemia Father    Heart disease Sister    Atrial fibrillation Sister    Hypertension Sister    Hyperlipidemia Sister    Coronary artery disease Brother    Heart attack Brother    Hypertension Brother    Hyperlipidemia Brother    Colon cancer Brother 51   Hypertension Brother    Hyperlipidemia Brother    Dementia Brother    Diabetes Son    Hypertension Son    Hyperlipidemia Son    Diabetes Son    Hypertension Son    Hyperlipidemia Son    Cerebral palsy Son    Hypertension Son    Hyperlipidemia Son    Hypertension Other        family history   Alcohol abuse Other    Esophageal cancer Neg Hx    Stomach cancer Neg Hx    Pancreatic cancer Neg Hx    Liver disease Neg Hx    Social History   Socioeconomic History   Marital status: Widowed    Spouse name: Not on file   Number of children: 3   Years of education: 11th   Highest education level: 11th grade  Occupational History    Occupation: disabled  Tobacco Use   Smoking status: Never    Passive exposure: Never   Smokeless tobacco: Never  Vaping Use   Vaping status: Never Used  Substance and Sexual Activity   Alcohol use: No    Alcohol/week: 0.0 standard drinks  of alcohol   Drug use: No   Sexual activity: Not Currently    Partners: Male    Comment: not working, lives with husband, 3 sons  Other Topics Concern   Not on file  Social History Narrative   Client's spouse recently passed Feb 2021      Lives with 2 Son and 1 granddaughter   R handed   Caffeine: ocas., has cut back and drinking more water   Social Drivers of Health   Financial Resource Strain: Low Risk  (03/14/2024)   Overall Financial Resource Strain (CARDIA)    Difficulty of Paying Living Expenses: Not hard at all  Food Insecurity: No Food Insecurity (03/14/2024)   Hunger Vital Sign    Worried About Running Out of Food in the Last Year: Never true    Ran Out of Food in the Last Year: Never true  Transportation Needs: No Transportation Needs (03/14/2024)   PRAPARE - Administrator, Civil Service (Medical): No    Lack of Transportation (Non-Medical): No  Physical Activity: Inactive (03/14/2024)   Exercise Vital Sign    Days of Exercise per Week: 0 days    Minutes of Exercise per Session: 0 min  Stress: No Stress Concern Present (03/14/2024)   [employer] of Occupational Health - Occupational Stress Questionnaire    Feeling of Stress : Not at all  Social Connections: Moderately Isolated (03/14/2024)   Social Connection and Isolation Panel [NHANES]    Frequency of Communication with Friends and Family: Once a week    Frequency of Social Gatherings with Friends and Family: Twice a week    Attends Religious Services: 1 to 4 times per year    Active Member of [employer] or Organizations: No    Attends Banker Meetings: Never    Marital Status: Widowed    Tobacco Counseling Counseling given: No    Clinical  Intake:  Pre-visit preparation completed: Yes  Pain : No/denies pain     BMI - recorded: 36.18 Nutritional Status: BMI > 30  Obese Nutritional Risks: None Diabetes: Yes CBG done?: Yes CBG resulted in Enter/ Edit results?: Yes (fasting - 118) Did pt. bring in CBG monitor from home?: No  Lab Results  Component Value Date   HGBA1C 6.4 12/21/2023   HGBA1C 6.3 08/18/2023   HGBA1C 6.3 05/11/2023     How often do you need to have someone help you when you read instructions, pamphlets, or other written materials from your doctor or pharmacy?: 1 - Never  Interpreter Needed?: No  Information entered by :: Hassell Halim, CMA   Activities of Daily Living     03/14/2024    3:35 PM  In your present state of health, do you have any difficulty performing the following activities:  Hearing? 0  Vision? 0  Difficulty concentrating or making decisions? 0  Walking or climbing stairs? 0  Dressing or bathing? 0  Doing errands, shopping? 0  Preparing Food and eating ? N  Using the Toilet? N  In the past six months, have you accidently leaked urine? Y  Comment wears a pad  Do you have problems with loss of bowel control? N  Managing your Medications? N  Managing your Finances? N  Housekeeping or managing your Housekeeping? N    Patient Care Team: Etta Grandchild, MD as PCP - General Allyson Sabal Delton See, MD as PCP - Cardiology (Cardiology) Dohmeier, Porfirio Mylar, MD as Consulting Physician (Neurology) Artis Delay, MD as Consulting Physician (  Hematology and Oncology) Ellin Saba, [facility] (Inactive) (Pharmacist)  Indicate any recent Medical Services you may have received from other than [facility] providers in the past year (date may be approximate).     Assessment:   This is a routine wellness examination for [place].  Hearing/Vision screen Hearing Screening - Comments:: Denies hearing difficulties   Vision Screening - Comments:: Wears rx glasses - up to date with routine eye exams with Dr  Dione Booze   Goals Addressed               This Visit's Progress     Patient Stated (pt-stated)        Patient staying active - just had a birthday.       Depression Screen     03/14/2024    3:37 PM 02/10/2023   11:11 AM 05/05/2022    1:21 PM 02/02/2022    2:23 PM 02/27/2021   11:08 AM 05/27/2020    1:15 PM 03/18/2020   12:44 PM  PHQ 2/9 Scores  PHQ - 2 Score 0 1 0 1 4 2 3   PHQ- 9 Score 0  0 4 16 12 15     Fall Risk     03/14/2024    3:35 PM 03/08/2024   10:23 AM 02/10/2023   11:15 AM 05/05/2022    1:21 PM 03/06/2022    4:41 PM  Fall Risk   Falls in the past year? 0 0 0 0 0  Number falls in past yr: 0 0 0 0   Injury with Fall? 0 0 0 0 0  Risk for fall due to : No Fall Risks No Fall Risks Impaired balance/gait;Impaired mobility;Impaired vision    Follow up Falls prevention discussed;Falls evaluation completed Falls evaluation completed Falls prevention discussed      MEDICARE RISK AT HOME:  Medicare Risk at Home Any stairs in or around the home?: Yes (outside) If so, are there any without handrails?: No Home free of loose throw rugs in walkways, pet beds, electrical cords, etc?: Yes Adequate lighting in your home to reduce risk of falls?: Yes Life alert?: No Use of a cane, walker or w/c?: No Grab bars in the bathroom?: Yes Shower chair or bench in shower?: Yes Elevated toilet seat or a handicapped toilet?: Yes  TIMED UP AND GO:  Was the test performed?  No  Cognitive Function: 6CIT completed    04/29/2016    1:59 PM 04/29/2015   12:14 PM 04/29/2015   11:57 AM  MMSE - Mini Mental State Exam  Not completed: -- -- Unable to complete      01/26/2023   10:48 AM 08/12/2022    2:32 PM  Montreal Cognitive Assessment   Visuospatial/ Executive (0/5) 4 2  Naming (0/3) 3 3  Attention: Read list of digits (0/2) 2 1  Attention: Read list of letters (0/1) 1 1  Attention: Serial 7 subtraction starting at 100 (0/3) 3 1  Language: Repeat phrase (0/2) 2 2  Language : Fluency (0/1)  1 0  Abstraction (0/2) 2 2  Delayed Recall (0/5) 4 3  Orientation (0/6) 6 6  Total 28 21      03/14/2024    3:36 PM  6CIT Screen  What Year? 0 points  What month? 0 points  What time? 0 points  Count back from 20 0 points  Months in reverse 0 points  Repeat phrase 0 points  Total Score 0 points    Immunizations Immunization History  Administered Date(s)  Administered   Fluad Quad(high Dose 65+) 09/13/2019, 09/25/2021, 09/08/2022   Fluad Trivalent(High Dose 65+) 08/18/2023   Influenza Split 09/21/2011, 08/19/2012   Influenza Whole 09/25/2009, 09/04/2010   Influenza, High Dose Seasonal PF 09/23/2016, 10/06/2017, 08/18/2018   Influenza,inj,Quad PF,6+ Mos 09/01/2013, 11/29/2014, 08/30/2015, 08/13/2020   PFIZER(Purple Top)SARS-COV-2 Vaccination 02/11/2020, 03/06/2020, 09/19/2020   Pneumococcal Conjugate-13 07/13/2014   Pneumococcal Polysaccharide-23 01/20/2013, 04/18/2018   Td 02/05/2010   Tdap 06/02/2017   Zoster Recombinant(Shingrix) 08/18/2018, 01/04/2019    Screening Tests Health Maintenance  Topic Date Due   COVID-19 Vaccine (4 - 2024-25 season) 08/15/2023   OPHTHALMOLOGY EXAM  08/27/2023   HEMOGLOBIN A1C  06/19/2024   INFLUENZA VACCINE  07/14/2024   Diabetic kidney evaluation - Urine ACR  08/17/2024   FOOT EXAM  08/17/2024   Diabetic kidney evaluation - eGFR measurement  03/08/2025   Medicare Annual Wellness (AWV)  03/14/2025   DTaP/Tdap/Td (3 - Td or Tdap) 06/03/2027   Colonoscopy  04/21/2028   Pneumonia Vaccine 30+ Years old  Completed   DEXA SCAN  Completed   Hepatitis C Screening  Completed   Zoster Vaccines- Shingrix  Completed   HPV VACCINES  Aged Out    Health Maintenance  Health Maintenance Due  Topic Date Due   COVID-19 Vaccine (4 - 2024-25 season) 08/15/2023   OPHTHALMOLOGY EXAM  08/27/2023   Health Maintenance Items Addressed:   Additional Screening:  Vision Screening: Recommended annual ophthalmology exams for early detection of glaucoma  and other disorders of the eye.  Dental Screening: Recommended annual dental exams for proper oral hygiene  Community Resource Referral / Chronic Care Management: CRR required this visit?  No   CCM required this visit?  No     Plan:     I have personally reviewed and noted the following in the patient's chart:   Medical and social history Use of alcohol, tobacco or illicit drugs  Current medications and supplements including opioid prescriptions. Patient is currently taking opioid prescriptions. Information provided to patient regarding non-opioid alternatives. Patient advised to discuss non-opioid treatment plan with their provider. Functional ability and status Nutritional status Physical activity Advanced directives List of other physicians Hospitalizations, surgeries, and ER visits in previous 12 months Vitals Screenings to include cognitive, depression, and falls Referrals and appointments  In addition, I have reviewed and discussed with patient certain preventive protocols, quality metrics, and best practice recommendations. A written personalized care plan for preventive services as well as general preventive health recommendations were provided to patient.     Darreld Mclean, CMA   03/14/2024   After Visit Summary: (In Person-Printed) AVS printed and given to the patient  Notes: Nothing significant to report at this time.

## 2024-03-15 ENCOUNTER — Other Ambulatory Visit (HOSPITAL_COMMUNITY): Payer: Self-pay

## 2024-03-15 ENCOUNTER — Ambulatory Visit: Payer: PPO | Attending: Nurse Practitioner | Admitting: Nurse Practitioner

## 2024-03-15 ENCOUNTER — Encounter: Payer: Self-pay | Admitting: Nurse Practitioner

## 2024-03-15 VITALS — BP 132/78 | HR 89 | Ht 66.0 in | Wt 217.4 lb

## 2024-03-15 DIAGNOSIS — E785 Hyperlipidemia, unspecified: Secondary | ICD-10-CM

## 2024-03-15 DIAGNOSIS — I25118 Atherosclerotic heart disease of native coronary artery with other forms of angina pectoris: Secondary | ICD-10-CM | POA: Diagnosis not present

## 2024-03-15 DIAGNOSIS — R002 Palpitations: Secondary | ICD-10-CM | POA: Diagnosis not present

## 2024-03-15 DIAGNOSIS — I10 Essential (primary) hypertension: Secondary | ICD-10-CM

## 2024-03-15 DIAGNOSIS — G4733 Obstructive sleep apnea (adult) (pediatric): Secondary | ICD-10-CM | POA: Diagnosis not present

## 2024-03-15 NOTE — Progress Notes (Signed)
 Office Visit    Patient Name: Valerie [name] Date of Encounter: 03/15/2024  Primary Care Provider:  Etta Grandchild, MD Primary Cardiologist:  Nanetta Batty, MD  Chief Complaint   78 year old female with a history of elevated coronary artery calcium score/nonobstructive CAD, palpitations, hypertension, hyperlipidemia, OSA, GERD, and hypothyroidism who presents for follow-up related to CAD and hypertension.   Past Medical History    Past Medical History:  Diagnosis Date   Anemia    Anxiety and depression    Arthritis    Asthma    Cataract    Degenerative arthritis    Depression    Diabetes mellitus, type 2 (HCC)    Fatigue    GERD (gastroesophageal reflux disease)    Glaucoma    Heart palpitations    Hemorrhoids    Hyperlipidemia    Hypertension    Hypothyroidism    Hypoxemia 11/24/2013   Memory deficit 10/04/2013   Obesity    Sleep apnea    BiPAP   Snoring disorder    Past Surgical History:  Procedure Laterality Date   benign tumors resected     CATARACT EXTRACTION Bilateral    FOOT SURGERY Left    bone spur   ROTATOR CUFF REPAIR Left    TUBAL LIGATION      Allergies  Allergies  Allergen Reactions   Food Swelling    bananas   Penicillins Swelling and Rash     Labs/Other Studies Reviewed    The following studies were reviewed today:  Cardiac Studies & Procedures   ______________________________________________________________________________________________   STRESS TESTS  NM PET CT CARDIAC PERFUSION MULTI W/ABSOLUTE BLOODFLOW 11/18/2023  Narrative   The study is normal. The study is intermediate risk. Study is suggestive of mild microvascular disease.   LV perfusion is normal. There is no evidence of ischemia. There is no evidence of infarction.   End diastolic cavity size is normal. End systolic cavity size is normal.   Myocardial blood flow was computed to be 0.25ml/g/min at rest and 1.9ml/g/min at stress. Global myocardial blood flow  reserve was 1.98 and was mildly abnormal.   Coronary calcium was present on the attenuation correction CT images. Severe coronary calcifications were present. Coronary calcifications were present in the left anterior descending artery, left circumflex artery and right coronary artery distribution(s). Aortic atherosclerosis noted.   Electronically Signed  By: Riley Lam M.D.  CLINICAL DATA:  This over-read does not include interpretation of cardiac or coronary anatomy or pathology. The Cardiac PET CT interpretation by the cardiologist is attached.  COMPARISON:  None Available.  FINDINGS: Cardiovascular: Aortic atherosclerosis. Normal heart size. Three-vessel coronary artery calcifications. No pericardial effusion.  Limited Mediastinum/Nodes: No enlarged mediastinal, hilar, or axillary lymph nodes. Small hiatal hernia. Trachea and esophagus demonstrate no significant findings.  Limited Lungs/Pleura: Mild bibasilar scarring or atelectasis. No pleural effusion or pneumothorax.  Upper Abdomen: No acute abnormality.  Musculoskeletal: No chest wall abnormality. No acute osseous findings.  IMPRESSION: 1. No acute CT findings of the included chest. 2. Coronary artery disease. 3. Small hiatal hernia.  Aortic Atherosclerosis (ICD10-I70.0).   Electronically Signed By: Jearld Lesch M.D. On: 11/18/2023 14:31   ECHOCARDIOGRAM  ECHOCARDIOGRAM COMPLETE 01/12/2024  Narrative ECHOCARDIOGRAM REPORT    Patient Name:   Valerie [name] Date of Exam: 01/12/2024 Medical Rec #:  [id number]       Height:       66.0 in Accession #:    [id number]      Weight:  218.8 lb Date of Birth:  07/05/1946       BSA:          2.078 m Patient Age:    77 years        BP:           121/70 mmHg Patient Gender: F               HR:           63 bpm. Exam Location:  Outpatient  Procedure: 2D Echo, 3D Echo, Cardiac Doppler, Color Doppler and Strain Analysis  Indications:    SOB (shortness of  breath) [R06.02 (ICD-10-CM)]; Lower extremity edema [R60.0 (ICD-10-CM)]  History:        Patient has prior history of Echocardiogram examinations, most recent 10/03/2018. Risk Factors:Hypertension, Diabetes and Dyslipidemia.  Sonographer:    Gertie Fey MHA, RDMS, RVT, RDCS Referring Phys: [id number] Abdullahi Vallone C Tredarius Cobern   Sonographer Comments: Global longitudinal strain was attempted. IMPRESSIONS   1. Left ventricular ejection fraction, by estimation, is 55 to 60%. Left ventricular ejection fraction by 3D volume is 58 %. The left ventricle has normal function. The left ventricle has no regional wall motion abnormalities. Left ventricular diastolic parameters were normal. The average left ventricular global longitudinal strain is -17.6 %. The global longitudinal strain is normal. 2. Right ventricular systolic function is low normal. The right ventricular size is normal. There is normal pulmonary artery systolic pressure. 3. The mitral valve is degenerative. No evidence of mitral valve regurgitation. No evidence of mitral stenosis. 4. The aortic valve is tricuspid. Aortic valve regurgitation is not visualized. No aortic stenosis is present.  Comparison(s): A prior study was performed on 10/03/2018. No significant change from prior study.  FINDINGS Left Ventricle: Left ventricular ejection fraction, by estimation, is 55 to 60%. Left ventricular ejection fraction by 3D volume is 58 %. The left ventricle has normal function. The left ventricle has no regional wall motion abnormalities. The average left ventricular global longitudinal strain is -17.6 %. The global longitudinal strain is normal. The left ventricular internal cavity size was normal in size. There is no left ventricular hypertrophy. Left ventricular diastolic parameters were normal.  Right Ventricle: The right ventricular size is normal. No increase in right ventricular wall thickness. Right ventricular systolic function is low normal.  There is normal pulmonary artery systolic pressure. The tricuspid regurgitant velocity is 0.83 m/s, and with an assumed right atrial pressure of 3 mmHg, the estimated right ventricular systolic pressure is 5.8 mmHg.  Left Atrium: Left atrial size was normal in size.  Right Atrium: Right atrial size was normal in size.  Pericardium: Trivial pericardial effusion is present.  Mitral Valve: The mitral valve is degenerative in appearance. There is mild thickening of the mitral valve leaflet(s). Normal mobility of the mitral valve leaflets. Mild mitral annular calcification. No evidence of mitral valve regurgitation. No evidence of mitral valve stenosis.  Tricuspid Valve: The tricuspid valve is normal in structure. Tricuspid valve regurgitation is not demonstrated. No evidence of tricuspid stenosis.  Aortic Valve: The aortic valve is tricuspid. Aortic valve regurgitation is not visualized. No aortic stenosis is present. Aortic valve mean gradient measures 3.0 mmHg. Aortic valve peak gradient measures 4.8 mmHg. Aortic valve area, by VTI measures 2.44 cm.  Pulmonic Valve: The pulmonic valve was normal in structure. Pulmonic valve regurgitation is not visualized. No evidence of pulmonic stenosis.  Aorta: The aortic root and ascending aorta are structurally normal, with no evidence of dilitation.  IAS/Shunts: The atrial septum is grossly normal.   LEFT VENTRICLE PLAX 2D LVIDd:         4.10 cm         Diastology LVIDs:         2.59 cm         LV e' medial:    8.92 cm/s LV PW:         0.98 cm         LV E/e' medial:  8.9 LV IVS:        0.79 cm         LV e' lateral:   7.83 cm/s LVOT diam:     1.89 cm         LV E/e' lateral: 10.2 LV SV:         65 LV SV Index:   31              2D LVOT Area:     2.81 cm        Longitudinal Strain 2D Strain GLS  -17.6 % Avg:  3D Volume EF LV 3D EF:    Left ventricul ar ejection fraction by 3D volume is 58 %.  3D Volume EF: 3D EF:        58 % LV  EDV:       114 ml LV ESV:       49 ml LV SV:        66 ml  RIGHT VENTRICLE RV Basal diam:  2.63 cm RV Mid diam:    2.76 cm RV S prime:     9.36 cm/s TAPSE (M-mode): 2.2 cm  LEFT ATRIUM           Index        RIGHT ATRIUM           Index LA diam:      2.98 cm 1.43 cm/m   RA Area:     10.24 cm LA Vol (A4C): 57.9 ml 27.87 ml/m  RA Volume:   22.55 ml  10.85 ml/m AORTIC VALVE AV Area (Vmax):    2.39 cm AV Area (Vmean):   2.32 cm AV Area (VTI):     2.44 cm AV Vmax:           110.00 cm/s AV Vmean:          72.400 cm/s AV VTI:            0.264 m AV Peak Grad:      4.8 mmHg AV Mean Grad:      3.0 mmHg LVOT Vmax:         93.80 cm/s LVOT Vmean:        59.800 cm/s LVOT VTI:          0.230 m LVOT/AV VTI ratio: 0.87  AORTA Ao Root diam: 2.83 cm Ao Asc diam:  3.40 cm  MITRAL VALVE               TRICUSPID VALVE MV Area (PHT): 3.27 cm    TR Peak grad:   2.8 mmHg MV Decel Time: 232 msec    TR Vmax:        83.00 cm/s MR Peak grad: 2.6 mmHg MR Vmax:      80.00 cm/s   SHUNTS MV E velocity: 79.80 cm/s  Systemic VTI:  0.23 m MV A velocity: 92.10 cm/s  Systemic Diam: 1.89 cm MV E/A ratio:  0.87  [employer] Product manager signed by [employer]  Tolia Signature Date/Time: 01/12/2024/3:34:37 PM    Final    MONITORS  CARDIAC EVENT MONITOR 04/04/2020  Narrative 1. NSR with occasional PVCs 2. Short atrial runs   CT SCANS  CT CORONARY MORPH W/CTA COR W/SCORE 04/06/2022  Addendum 04/06/2022 12:02 PM ADDENDUM REPORT: 04/06/2022 12:00  EXAM: OVER-READ INTERPRETATION  PET-CT CHEST  The following report is an over-read performed by radiologist Dr. Leatha Gilding [facility] on 04/06/2022. This over-read does not include interpretation of cardiac or coronary anatomy or pathology. The cardiac CT interpretation by the cardiologist is to be attached.  COMPARISON:  CT a chest 08/29/2015  FINDINGS: The visualized lung parenchyma shows no suspicious pulmonary nodule or  mass. Calcified granuloma noted left lower lobe. No focal airspace consolidation. No effusion.  No evidence for lymphadenopathy within the visualized mediastinum or hilar regions. Tiny hiatal hernia noted.  No suspicious lytic or sclerotic osseous abnormality.  IMPRESSION: No acute or clinically significant extracardiac findings.   Electronically Signed By: Kennith Center M.D. On: 04/06/2022 12:00  Narrative CLINICAL DATA:  57F with chest pain  EXAM: Cardiac/Coronary CTA  TECHNIQUE: The patient was scanned on a [employer].  FINDINGS: A 100 kV prospective scan was triggered in the descending thoracic aorta at 111 HU's. Axial non-contrast 3 mm slices were carried out through the heart. The data set was analyzed on a dedicated work station and scored using the Agatson method. Gantry rotation speed was 250 msecs and collimation was .6 mm. No beta blockade and 0.8 mg of sl NTG was given. The 3D data set was reconstructed in 5% intervals of the 67-82 % of the R-R cycle. Diastolic phases were analyzed on a dedicated work station using MPR, MIP and VRT modes. The patient received 80 cc of contrast.  Coronary Arteries:  Normal coronary origin.  Right dominance.  RCA is a large dominant artery that gives rise to PDA and PLA. Calcified plaque in the proximal RCA causes 0-24% stenosis. Calcified plaque in the mid RCA causes 25-49% stenosis.  Left main is a large artery that gives rise to LAD and LCX arteries.  LAD is a large vessel. Calcified plaque in the proximal LAD causes 0-24% stenosis. Calcified plaque in the mid LAD causes 25-49% stenosis. Calcified plaque in the distal LAD causes 25-49% stenosis  LCX is a non-dominant artery that gives rise to one large OM1 branch. Calcified plaque in the proximal LCX causes 0-24% stenosis. Calcified plaque in the mid LCX causes 0-24% stenosis  Calcified plaque in ramus causes 25-49% stenosis  Other findings:  Left  Ventricle: Normal size  Left Atrium: Normal size  Pulmonary Veins: Normal configuration  Right Ventricle: Normal size  Right Atrium: Normal size  Cardiac valves: No calcifications  Thoracic aorta: Normal size  Pulmonary Arteries: Normal size  Systemic Veins: Normal drainage  Pericardium: Normal thickness  IMPRESSION: 1. Coronary calcium score of 454. This was 90th percentile for age and sex matched control.  2.  Normal coronary origin with right dominance.  3.  Nonobstructive CAD  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  PFO  CAD-RADS 2. Mild non-obstructive CAD (25-49%). Consider non-atherosclerotic causes of chest pain. Consider preventive therapy and risk factor modification.  Electronically Signed: By: Epifanio Lesches M.D. On: 04/06/2022 11:26   CT SCANS  CT CARDIAC SCORING (SELF PAY ONLY) 02/12/2022  Addendum 02/12/2022  4:39 PM ADDENDUM REPORT: 02/12/2022 16:37  EXAM: OVER-READ INTERPRETATION  CT CHEST  The following report is an over-read  performed by radiologist Dr. Marinda Elk [facility] on 02/12/2022. This over-read does not include interpretation of cardiac or coronary anatomy or pathology. The coronary calcium score interpretation by the cardiologist is attached.  COMPARISON:  CT a of the chest on 08/29/2015  FINDINGS: Atherosclerosis of the thoracic aorta. Visualized mediastinum and hilar regions demonstrate no lymphadenopathy or masses. Stable small hiatal hernia. Visualized lungs show no evidence of pulmonary edema, consolidation, pneumothorax, nodule or pleural fluid. Visualized upper abdomen and bony structures are unremarkable.  IMPRESSION: 1. Atherosclerosis of the thoracic aorta. 2. Stable small hiatal hernia.   Electronically Signed By: Irish Lack M.D. On: 02/12/2022 16:37  Narrative CLINICAL DATA:  Cardiovascular Disease Risk stratification  EXAM: Coronary Calcium Score  TECHNIQUE: A  gated, non-contrast computed tomography scan of the heart was performed using 3mm slice thickness. Axial images were analyzed on a dedicated workstation. Calcium scoring of the coronary arteries was performed using the Agatston method.  FINDINGS: Coronary arteries: Normal origins.  Coronary Calcium Score:  Left main: 47.6  Left anterior descending artery: 126  Left circumflex artery: 266  Right coronary artery: 60.2  Total: 499  Percentile: 92nd  Pericardium: Normal.  Ascending Aorta: Normal caliber. Atherosclerosis of the ascending and descending aorta.  Non-cardiac: See separate report from [facility].  IMPRESSION: Coronary calcium score of 499. This was 92nd percentile for age-, race-, and sex-matched controls.  RECOMMENDATIONS: Coronary artery calcium (CAC) score is a strong predictor of incident coronary heart disease (CHD) and provides predictive information beyond traditional risk factors. CAC scoring is reasonable to use in the decision to withhold, postpone, or initiate statin therapy in intermediate-risk or selected borderline-risk asymptomatic adults (age 53-75 years and LDL-C >=70 to <190 mg/dL) who do not have diabetes or established atherosclerotic cardiovascular disease (ASCVD).* In intermediate-risk (10-year ASCVD risk >=7.5% to <20%) adults or selected borderline-risk (10-year ASCVD risk >=5% to <7.5%) adults in whom a CAC score is measured for the purpose of making a treatment decision the following recommendations have been made:  If CAC=0, it is reasonable to withhold statin therapy and reassess in 5 to 10 years, as long as higher risk conditions are absent (diabetes mellitus, family history of premature CHD in first degree relatives (males <55 years; females <65 years), cigarette smoking, or LDL >=190 mg/dL).  If CAC is 1 to 99, it is reasonable to initiate statin therapy for patients >=1 years of age.  If CAC is >=100 or >=75th  percentile, it is reasonable to initiate statin therapy at any age.  Cardiology referral should be considered for patients with CAC scores >=400 or >=75th percentile.  *2018 AHA/ACC/AACVPR/AAPA/ABC/ACPM/ADA/AGS/APhA/ASPC/NLA/PCNA Guideline on the Management of Blood Cholesterol: A Report of the American College of Cardiology/American Heart Association Task Force on Clinical Practice Guidelines. J Am Coll Cardiol. 2019;73(24):3168-3209.  Armanda Magic, MD  Electronically Signed: By: Armanda Magic M.D. On: 02/12/2022 12:20     ______________________________________________________________________________________________     Recent Labs: 12/21/2023: ALT 15 03/08/2024: BUN 10; Creatinine, Ser 0.94; Hemoglobin 9.8; Magnesium 1.8; Platelets 381.0; Potassium 4.1; Sodium 141  Recent Lipid Panel    Component Value Date/Time   CHOL 152 05/11/2023 1400   TRIG 253.0 (H) 05/11/2023 1400   HDL 48.90 05/11/2023 1400   CHOLHDL 3 05/11/2023 1400   VLDL 50.6 (H) 05/11/2023 1400   LDLCALC 56 05/05/2022 1356   LDLDIRECT 60.0 05/11/2023 1400    History of Present Illness    78 year old female with the above past medical history including elevated  coronary artery calcium score/nonobstructive CAD, palpitations, hypertension, hyperlipidemia, OSA, GERD, and hypothyroidism.   She was initially referred to cardiology in the setting of palpitations.  Echocardiogram and Myoview in 2019 were normal.  Outpatient cardiac monitor in 03/2020 showed sinus rhythm with PVCs, short atrial runs, no significant arrhythmia.  Coronary CTA in 03/2022 showed coronary calcium score of 454 (90th percentile), nonobstructive CAD.  She saw her PCP on 08/18/2023 in the setting of chest pain.  EKG and troponin were reassuring. Cardiac PET stress test in 11/2023 showed normal LV perfusion, no evidence of ischemia or infarction, EF 69%, mild microvascular disease. She was last seen in the office on 12/13/2023 and was stable from a  cardiac standpoint.   She reported stable chronic dyspnea, nonpitting bilateral lower extremity edema.  She was started on Imdur. Repeat echocardiogram in 12/2023 showed EF 55 to 60%, normal LV function, no RWMA, low normal RV systolic function, normal PASP, no significant valvular abnormalities.    She presents today for follow-up. Since her last visit she has been stable overall from a cardiac standpoint.  She notes less frequent episodes of chest pain since starting Imdur.  She continues to note generalized fatigue.  She was recently treated for UTI, she has noticed some mild confusion in this setting, this is improving slowly.  She has stable dependent nonpitting bilateral lower extremity edema, unchanged from prior visits, she denies dyspnea, palpitations, dizziness, PND, orthopnea, weight gain.  Overall, her symptoms have improved.  Home Medications    Current Outpatient Medications  Medication Sig Dispense Refill   albuterol (PROVENTIL) (2.5 MG/3ML) 0.083% nebulizer solution Take 3 mLs (2.5 mg total) by nebulization every 6 (six) hours as needed for wheezing or shortness of breath. 75 mL 3   albuterol (VENTOLIN HFA) 108 (90 Base) MCG/ACT inhaler Inhale 2 puffs into the lungs every 6 (six) hours as needed. For shortness of breath 3 each 4   aspirin 81 MG tablet Take 81 mg by mouth daily.      atorvastatin (LIPITOR) 20 MG tablet Take 1 tablet (20 mg total) by mouth daily. 90 tablet 0   Blood Glucose Calibration (ONETOUCH VERIO) LIQD 1 Act by In Vitro route daily. 1 each 5   Blood Glucose Monitoring Suppl (ONETOUCH VERIO FLEX SYSTEM) DEVI 1 Act by Does not apply route 2 (two) times daily. 1 each 3   budesonide-formoterol (SYMBICORT) 80-4.5 MCG/ACT inhaler Inhale 2 puffs into the lungs 2 (two) times daily as needed. 3 each 1   carvedilol (COREG) 25 MG tablet TAKE 1 TABLET BY MOUTH TWICE A DAY WITH FOOD 180 tablet 0   Cholecalciferol (VITAMIN D3) 50 MCG (2000 UT) capsule TAKE 1 CAPSULE BY MOUTH  EVERY DAY 90 capsule 1   famotidine (PEPCID) 40 MG tablet TAKE 1 TABLET BY MOUTH EVERY DAY 90 tablet 1   ferrous sulfate 325 (65 FE) MG tablet TAKE 1 TABLET BY MOUTH 3 TIMES DAILY WITH MEALS. 270 tablet 0   Finerenone (KERENDIA) 20 MG TABS Take 1 tablet (20 mg total) by mouth daily. 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 DX: E11.8 300 strip 1   irbesartan (AVAPRO) 300 MG tablet Take 0.5 tablets (150 mg total) by mouth daily. (Patient taking differently: Take 150 mg by mouth daily as needed.)  45 tablet 1   isosorbide mononitrate (IMDUR) 30 MG 24 hr tablet Take 0.5 tablets (15 mg total) by mouth daily. (Patient taking differently: Take 30 mg by mouth daily.) 45 tablet 3   latanoprost (XALATAN) 0.005 % ophthalmic solution Place 1 drop into both eyes at bedtime.      ONETOUCH VERIO test strip 1 EACH BY OTHER ROUTE 2 (TWO) TIMES DAILY. USE AS INSTRUCTED 200 strip 1   oxyCODONE-acetaminophen (PERCOCET/ROXICET) 5-325 MG tablet Take 1 tablet by mouth every 8 (eight) hours as needed for severe pain (pain score 7-10). 90 tablet 0   pantoprazole (PROTONIX) 40 MG tablet TAKE 1 TABLET BY MOUTH 2 TIMES DAILY BEFORE A MEAL 180 tablet 0   potassium chloride SA (KLOR-CON M) 20 MEQ tablet TAKE 1 TABLET BY MOUTH EVERY DAY 90 tablet 0   SYNJARDY 12.5-500 MG TABS TAKE 1 TABLET BY MOUTH 2 (TWO) TIMES DAILY. TAKE 2 TABLETS DAILY. 180 tablet 1   TRULICITY 1.5 MG/0.5ML SOAJ INJECT 1.5 MG (0.5ML) UNDER THE SKIN ONCE A WEEK 6 mL 0   No current facility-administered medications for this visit.     Review of Systems    She denies palpitations, dyspnea, pnd, orthopnea, n, v, dizziness, syncope, weight gain, or early satiety. All other systems reviewed and are otherwise negative except as noted above.   Physical Exam    VS:  BP 132/78  (BP Location: Left Arm, Patient Position: Sitting, Cuff Size: Normal)   Pulse 89   Ht 5\' 6"  (1.676 m)   Wt 217 lb 6.4 oz (98.6 kg)   SpO2 99%   BMI 35.09 kg/m  GEN: Well nourished, well developed, in no acute distress. HEENT: normal. Neck: Supple, no JVD, carotid bruits, or masses. Cardiac: RRR, no murmurs, rubs, or gallops. No clubbing, cyanosis, edema.  Radials/DP/PT 2+ and equal bilaterally.  Respiratory:  Respirations regular and unlabored, clear to auscultation bilaterally. GI: Soft, nontender, nondistended, BS + x 4. MS: no deformity or atrophy. Skin: warm and dry, no rash. Neuro:  Strength and sensation are intact. Psych: Normal affect.  Accessory Clinical Findings    ECG personally reviewed by me today -    - no EKG in office today.    Lab Results  Component Value Date   WBC 9.3 03/08/2024   HGB 9.8 (L) 03/08/2024   HCT 30.3 (L) 03/08/2024   MCV 90.1 03/08/2024   PLT 381.0 03/08/2024   Lab Results  Component Value Date   CREATININE 0.94 03/08/2024   BUN 10 03/08/2024   NA 141 03/08/2024   K 4.1 03/08/2024   CL 104 03/08/2024   CO2 30 03/08/2024   Lab Results  Component Value Date   ALT 15 12/21/2023   AST 17 12/21/2023   ALKPHOS 112 12/21/2023   BILITOT 0.4 12/21/2023   Lab Results  Component Value Date   CHOL 152 05/11/2023   HDL 48.90 05/11/2023   LDLCALC 56 05/05/2022   LDLDIRECT 60.0 05/11/2023   TRIG 253.0 (H) 05/11/2023   CHOLHDL 3 05/11/2023    Lab Results  Component Value Date   HGBA1C 6.4 12/21/2023    Assessment & Plan   1. CAD/chest pain/lower extremity edema: Coronary CTA in 03/2022 showed coronary calcium score of 454 (90th percentile), nonobstructive CAD. Cardiac PET stress test in 11/2023 showed normal LV perfusion, no evidence of ischemia or infarction, EF 69%, mild microvascular disease.  Repeat echocardiogram in 12/2023 showed EF 55 to 60%, normal LV function, no RWMA, low normal  RV systolic function, normal PASP, no significant  valvular abnormalities. She continues to report intermittent chest discomfort both at rest and with activity, overall improved with Imdur. Will increase Imdur to 30 mg daily.  Denies dyspnea.  Reports stable nonpitting dependent bilateral lower extremity edema.  Euvolemic and well compensated on exam.  Given ongoing chest discomfort, will increase Imdur to 30 mg daily.  Continue aspirin, carvedilol, irbesartan and Lipitor.   2. Palpitations: Outpatient cardiac monitor in 03/2020 showed sinus rhythm with PVCs, short atrial runs, no significant arrhythmia. Denies any recent palpitations. Continue carvedilol.    3. Hypertension: BP well controlled. Continue current antihypertensive regimen.    4. Hyperlipidemia: LDL was 60 in 04/2023.  Continue Lipitor.   5. OSA: Adherent to CPAP.   6. Disposition:  Follow-up in 4 to 6 months with Dr. Allyson Sabal.      Joylene Grapes, NP 03/15/2024, 2:04 PM

## 2024-03-15 NOTE — Patient Instructions (Signed)
 Medication Instructions:  INCREASE YOUR IMDUR TO 30 MG DAILY.   Lab Work: NONE   Testing/Procedures: NONE  Follow-Up: At [facility], you and your health needs are our priority.  As part of our continuing mission to provide you with exceptional heart care, our providers are all part of one team.  This team includes your primary Cardiologist (physician) and Advanced Practice Providers or APPs (Physician Assistants and Nurse Practitioners) who all work together to provide you with the care you need, when you need it.  Your next appointment:   4-6 MONTHS  Provider:   Nanetta Batty, MD    We recommend signing up for the patient portal called "MyChart".  Sign up information is provided on this After Visit Summary.  MyChart is used to connect with patients for Virtual Visits (Telemedicine).  Patients are able to view lab/test results, encounter notes, upcoming appointments, etc.  Non-urgent messages can be sent to your provider as well.   To learn more about what you can do with MyChart, go to [email or web address].   Other Instructions:      1st Floor: - Lobby - Registration  - Pharmacy  - Lab - Cafe  2nd Floor: - PV Lab - Diagnostic Testing (echo, CT, nuclear med)  3rd Floor: - Vacant  4th Floor: - TCTS (cardiothoracic surgery) - AFib Clinic - Structural Heart Clinic - Vascular Surgery  - Vascular Ultrasound  5th Floor: - HeartCare Cardiology (general and EP) - Clinical Pharmacy for coumadin, hypertension, lipid, weight-loss medications, and med management appointments    Valet parking services will be available as well.

## 2024-03-15 NOTE — Telephone Encounter (Signed)
 Pharmacy Patient Advocate Encounter  Received notification from [facility] ADVANTAGE/RX ADVANCE that Prior Authorization for Kerendia 20MG  tablets has been DENIED.  Pt may be eligible for PAP PLEASE CHECK WITH RX MED ASSISTANCE TEAM    PA #/Case ID/Reference #: [id number]

## 2024-03-16 ENCOUNTER — Other Ambulatory Visit (HOSPITAL_COMMUNITY): Payer: Self-pay

## 2024-03-16 NOTE — Telephone Encounter (Signed)
Denial notice

## 2024-03-16 NOTE — Telephone Encounter (Signed)
 Please advise. Could you help this patient ?

## 2024-03-17 ENCOUNTER — Other Ambulatory Visit: Payer: Self-pay | Admitting: Internal Medicine

## 2024-03-17 DIAGNOSIS — E785 Hyperlipidemia, unspecified: Secondary | ICD-10-CM

## 2024-03-18 ENCOUNTER — Other Ambulatory Visit: Payer: Self-pay | Admitting: Internal Medicine

## 2024-03-18 DIAGNOSIS — I10 Essential (primary) hypertension: Secondary | ICD-10-CM

## 2024-03-20 NOTE — Telephone Encounter (Signed)
 Copied from CRM [id number]. Topic: Referral - Prior Authorization Question >> Mar 17, 2024  5:11 PM Abigail D wrote: Reason for CRM: Suzy Bouchard from [facility] Advantage calling for Prior Authorization for Kerendia Medication. Please call back [phone] option 2

## 2024-03-21 ENCOUNTER — Other Ambulatory Visit: Payer: Self-pay

## 2024-03-21 ENCOUNTER — Telehealth: Payer: Self-pay

## 2024-03-21 ENCOUNTER — Other Ambulatory Visit (HOSPITAL_COMMUNITY): Payer: Self-pay

## 2024-03-21 DIAGNOSIS — N1831 Chronic kidney disease, stage 3a: Secondary | ICD-10-CM

## 2024-03-21 DIAGNOSIS — N1832 Chronic kidney disease, stage 3b: Secondary | ICD-10-CM

## 2024-03-21 NOTE — Telephone Encounter (Signed)
 A referral has been placed for christy the pharmacist to help with patient assistance.

## 2024-03-21 NOTE — Telephone Encounter (Signed)
 Pharmacy Patient Advocate Encounter   Received notification from Fax that prior authorization for Kerendia 20mg  is required/requested.   Insurance verification completed.   The patient is insured through [facility] ADVANTAGE/RX ADVANCE .   Per test claim: PA required; PA submitted to above mentioned insurance via Fax Key/confirmation #/EOC -- Status is pending   Fax# [phone] Phone# [phone]

## 2024-03-22 ENCOUNTER — Telehealth: Payer: Self-pay | Admitting: *Deleted

## 2024-03-22 NOTE — Progress Notes (Unsigned)
 Care Guide Pharmacy Note  03/22/2024 Name: Valerie [name] MRN: [id number] DOB: 01-Jan-1946  Referred By: Etta Grandchild, MD Reason for referral: Complex Care Management and Call Attempt #1 (Outreach to schedule referral with pharmacist )   Valerie [name] is a 78 y.o. year old female who is a primary care patient of Etta Grandchild, MD.  Valerie [name] was referred to the pharmacist for assistance related to: CKD Stage 3  An unsuccessful telephone outreach was attempted today to contact the patient who was referred to the pharmacy team for assistance with medication assistance. Additional attempts will be made to contact the patient.  Burman Nieves, CMA   [facility], [facility] Guide Direct Dial: [phone]  Fax: [phone] Website: [email or web address]

## 2024-03-23 NOTE — Progress Notes (Signed)
 Care Guide Pharmacy Note  03/23/2024 Name: Valerie [name] MRN: [id number] DOB: 29-Feb-1946  Referred By: Etta Grandchild, MD Reason for referral: Complex Care Management and Call Attempt #1 (Outreach to schedule referral with pharmacist )   Valerie [name] is a 78 y.o. year old female who is a primary care patient of Etta Grandchild, MD.  Valerie [name] was referred to the pharmacist for assistance related to: CKD Stage 3  A second unsuccessful telephone outreach was attempted today to contact the patient who was referred to the pharmacy team for assistance with medication assistance. Additional attempts will be made to contact the patient.  Burman Nieves, CMA [place]  [facility], [facility] Guide Direct Dial: [phone]  Fax: [phone] Website: [email or web address]

## 2024-03-24 NOTE — Progress Notes (Signed)
 Care Guide Pharmacy Note  03/24/2024 Name: TAMSIN NADER MRN: [id number] DOB: [birth year]  Referred By: Etta Grandchild, MD Reason for referral: Complex Care Management and Call Attempt #1 (Outreach to schedule referral with pharmacist )   Valerie [name] is a 78 y.o. year old female who is a primary care patient of Etta Grandchild, MD.  [name] was referred to the pharmacist for assistance related to: CKD Stage 3  Successful contact was made with the patient to discuss pharmacy services including being ready for the pharmacist to call at least 5 minutes before the scheduled appointment time and to have medication bottles and any blood pressure readings ready for review. The patient agreed to meet with the pharmacist via telephone visit on 04/11/2024  Burman Nieves, CMA [place]  [facility], [facility] Guide Direct Dial: [phone]  Fax: [phone] Website: [email or web address]

## 2024-03-24 NOTE — Progress Notes (Signed)
 Care Guide Pharmacy Note  03/24/2024 Name: Valerie [name] MRN: [id number] DOB: 14-Sep-1946  Referred By: Etta Grandchild, MD Reason for referral: Complex Care Management and Call Attempt #1 (Outreach to schedule referral with pharmacist )   Valerie [name] is a 78 y.o. year old female who is a primary care patient of Etta Grandchild, MD.  Valerie [name] was referred to the pharmacist for assistance related to: CKD Stage 3  A third unsuccessful telephone outreach was attempted today to contact the patient who was referred to the pharmacy team for assistance with medication assistance. The Population Health team is pleased to engage with this patient at any time in the future upon receipt of referral and should he/she be interested in assistance from the Population Health team.  Burman Nieves, CMA [facility]  [facility], [facility] Guide Direct Dial: [phone]  Fax: [phone] Website: [email or web address]

## 2024-03-25 ENCOUNTER — Ambulatory Visit (HOSPITAL_COMMUNITY): Admission: EM | Admit: 2024-03-25 | Discharge: 2024-03-25 | Disposition: A | Discharge status: Home / Self Care

## 2024-03-25 ENCOUNTER — Encounter (HOSPITAL_COMMUNITY): Payer: Self-pay | Admitting: Emergency Medicine

## 2024-03-25 DIAGNOSIS — J0140 Acute pansinusitis, unspecified: Secondary | ICD-10-CM | POA: Diagnosis not present

## 2024-03-25 MED ORDER — DOXYCYCLINE HYCLATE 100 MG PO CAPS: 100.0000 mg | ORAL_CAPSULE | Freq: Two times a day (BID) | ORAL | 0 refills | Status: AC

## 2024-03-25 MED ORDER — AZELASTINE HCL 0.1 % NA SOLN: 1.0000 | Freq: Two times a day (BID) | NASAL | 1 refills | Status: AC

## 2024-03-25 MED ORDER — PREDNISONE 20 MG PO TABS
40.0000 mg | ORAL_TABLET | Freq: Every day | ORAL | 0 refills | Status: AC
Start: 2024-03-25 — End: 2024-03-30

## 2024-03-25 NOTE — ED Triage Notes (Signed)
 Pt reports about 2-3 days ago having pain around nose and under eyes and some nasal congestion. Reports not able to sleep with her cpap due pain around nose and face. Thinks has some sore in her nose.

## 2024-03-25 NOTE — Discharge Instructions (Addendum)
 1. Acute non-recurrent pansinusitis (Primary) - azelastine (ASTELIN) 0.1 % nasal spray; Place 1 spray into both nostrils 2 (two) times daily. Use in each nostril as directed  Dispense: 30 mL; Refill: 1 - predniSONE (DELTASONE) 20 MG tablet; Take 2 tablets (40 mg total) by mouth daily for 5 days.  Dispense: 10 tablet; Refill: 0 - doxycycline (VIBRAMYCIN) 100 MG capsule; Take 1 capsule (100 mg total) by mouth 2 (two) times daily for 7 days.  Dispense: 14 capsule; Refill: 0 - Ambulatory referral to ENT for follow-up evaluation if symptoms do not resolve with current medication therapy. -Continue to monitor symptoms for any change in severity if there is any escalation of current symptoms or development of new symptoms follow-up in ER for further evaluation and management.

## 2024-03-25 NOTE — ED Provider Notes (Signed)
 [facility]  Note:  This document was prepared using Dragon voice recognition software and may include unintentional dictation errors.  MRN: [id number] DOB: [birth year]  Subjective:   Valerie [name] is a 78 y.o. female presenting for bilateral nasal congestion and inflammation, pain over the bridge of the nose and under each eye, mild allergic shiners noted on exam x 3 to 4 days.  Patient reports that she had nasal congestion prior to the onset of facial pain.  Patient states that she uses CPAP and has been unable to use CPAP for the last 3 nights because of the pain it causes over the bridge of her nose.  Patient is not taking any over-the-counter medication or allergy medication to treat symptoms.  No shortness of breath, chest pain, weakness, dizziness.  No current facility-administered medications for this encounter.  Current Outpatient Medications:    azelastine (ASTELIN) 0.1 % nasal spray, Place 1 spray into both nostrils 2 (two) times daily. Use in each nostril as directed, Disp: 30 mL, Rfl: 1   doxycycline (VIBRAMYCIN) 100 MG capsule, Take 1 capsule (100 mg total) by mouth 2 (two) times daily for 7 days., Disp: 14 capsule, Rfl: 0   predniSONE (DELTASONE) 20 MG tablet, Take 2 tablets (40 mg total) by mouth daily for 5 days., Disp: 10 tablet, Rfl: 0   albuterol (PROVENTIL) (2.5 MG/3ML) 0.083% nebulizer solution, Take 3 mLs (2.5 mg total) by nebulization every 6 (six) hours as needed for wheezing or shortness of breath., Disp: 75 mL, Rfl: 3   albuterol (VENTOLIN HFA) 108 (90 Base) MCG/ACT inhaler, Inhale 2 puffs into the lungs every 6 (six) hours as needed. For shortness of breath, Disp: 3 each, Rfl: 4   aspirin 81 MG tablet, Take 81 mg by mouth daily. , Disp: , Rfl:    atorvastatin (LIPITOR) 20 MG tablet, TAKE 1 TABLET BY MOUTH EVERY DAY, Disp: 90 tablet, Rfl: 0   Blood Glucose Calibration (ONETOUCH VERIO) LIQD, 1 Act by In Vitro route daily., Disp: 1 each, Rfl: 5   Blood  Glucose Monitoring Suppl (ONETOUCH VERIO FLEX SYSTEM) DEVI, 1 Act by Does not apply route 2 (two) times daily., Disp: 1 each, Rfl: 3   budesonide-formoterol (SYMBICORT) 80-4.5 MCG/ACT inhaler, Inhale 2 puffs into the lungs 2 (two) times daily as needed., Disp: 3 each, Rfl: 1   carvedilol (COREG) 25 MG tablet, TAKE 1 TABLET BY MOUTH TWICE A DAY WITH FOOD, Disp: 180 tablet, Rfl: 0   Cholecalciferol (VITAMIN D3) 50 MCG (2000 UT) capsule, TAKE 1 CAPSULE BY MOUTH EVERY DAY, Disp: 90 capsule, Rfl: 1   famotidine (PEPCID) 40 MG tablet, TAKE 1 TABLET BY MOUTH EVERY DAY, Disp: 90 tablet, Rfl: 1   ferrous sulfate 325 (65 FE) MG tablet, TAKE 1 TABLET BY MOUTH 3 TIMES DAILY WITH MEALS., Disp: 270 tablet, Rfl: 0   Finerenone (KERENDIA) 20 MG TABS, Take 1 tablet (20 mg total) by mouth daily. Via Patient Assistance Health and safety inspector), Disp: 90 tablet, Rfl: 1   gabapentin (NEURONTIN) 300 MG capsule, TAKE 1 CAPSULE BY MOUTH THREE TIMES A DAY, Disp: 270 capsule, Rfl: 1   glucose blood (FREESTYLE TEST STRIPS) test strip, USE TO TEST BLOOD SUGAR 3 TIMES A DAY. DX: E11.8, Disp: 300 strip, Rfl: 1   irbesartan (AVAPRO) 300 MG tablet, Take 0.5 tablets (150 mg total) by mouth daily. (Patient taking differently: Take 150 mg by mouth daily as needed.), Disp: 45 tablet, Rfl: 1   isosorbide mononitrate (IMDUR) 30 MG 24  hr tablet, Take 0.5 tablets (15 mg total) by mouth daily. (Patient taking differently: Take 30 mg by mouth daily.), Disp: 45 tablet, Rfl: 3   latanoprost (XALATAN) 0.005 % ophthalmic solution, Place 1 drop into both eyes at bedtime. , Disp: , Rfl:    ONETOUCH VERIO test strip, 1 EACH BY OTHER ROUTE 2 (TWO) TIMES DAILY. USE AS INSTRUCTED, Disp: 200 strip, Rfl: 1   oxyCODONE-acetaminophen (PERCOCET/ROXICET) 5-325 MG tablet, Take 1 tablet by mouth every 8 (eight) hours as needed for severe pain (pain score 7-10)., Disp: 90 tablet, Rfl: 0   pantoprazole (PROTONIX) 40 MG tablet, TAKE 1 TABLET BY MOUTH 2 TIMES DAILY BEFORE A MEAL,  Disp: 180 tablet, Rfl: 0   potassium chloride SA (KLOR-CON M) 20 MEQ tablet, TAKE 1 TABLET BY MOUTH EVERY DAY, Disp: 90 tablet, Rfl: 0   SYNJARDY 12.5-500 MG TABS, TAKE 1 TABLET BY MOUTH 2 (TWO) TIMES DAILY. TAKE 2 TABLETS DAILY., Disp: 180 tablet, Rfl: 1   TRULICITY 1.5 MG/0.5ML SOAJ, INJECT 1.5 MG (0.5ML) UNDER THE SKIN ONCE A WEEK, Disp: 6 mL, Rfl: 0   Allergies  Allergen Reactions   Food Swelling    bananas   Penicillins Swelling and Rash    Past Medical History:  Diagnosis Date   Anemia    Anxiety and depression    Arthritis    Asthma    Cataract    Degenerative arthritis    Depression    Diabetes mellitus, type 2 (HCC)    Fatigue    GERD (gastroesophageal reflux disease)    Glaucoma    Heart palpitations    Hemorrhoids    Hyperlipidemia    Hypertension    Hypothyroidism    Hypoxemia 11/24/2013   Memory deficit 10/04/2013   Obesity    Sleep apnea    BiPAP   Snoring disorder      Past Surgical History:  Procedure Laterality Date   benign tumors resected     CATARACT EXTRACTION Bilateral    FOOT SURGERY Left    bone spur   ROTATOR CUFF REPAIR Left    TUBAL LIGATION      Family History  Problem Relation Age of Onset   Alcohol abuse Mother    Heart attack Father    Hypertension Father    Hyperlipidemia Father    Heart disease Sister    Atrial fibrillation Sister    Hypertension Sister    Hyperlipidemia Sister    Coronary artery disease Brother    Heart attack Brother    Hypertension Brother    Hyperlipidemia Brother    Colon cancer Brother 51   Hypertension Brother    Hyperlipidemia Brother    Dementia Brother    Diabetes Son    Hypertension Son    Hyperlipidemia Son    Diabetes Son    Hypertension Son    Hyperlipidemia Son    Cerebral palsy Son    Hypertension Son    Hyperlipidemia Son    Hypertension Other        family history   Alcohol abuse Other    Esophageal cancer Neg Hx    Stomach cancer Neg Hx    Pancreatic cancer Neg Hx     Liver disease Neg Hx     Social History   Tobacco Use   Smoking status: Never    Passive exposure: Never   Smokeless tobacco: Never  Vaping Use   Vaping status: Never Used  Substance Use Topics   Alcohol  use: No    Alcohol/week: 0.0 standard drinks of alcohol   Drug use: No    ROS Refer to HPI for ROS details.  Objective:   Vitals: BP (!) 155/78 (BP Location: Right Arm)   Pulse 74   Temp (!) 97.4 F (36.3 C) (Oral)   Resp 18   SpO2 98%   Physical Exam Vitals and nursing note reviewed.  Constitutional:      General: She is not in acute distress.    Appearance: Normal appearance. She is well-developed. She is not ill-appearing or toxic-appearing.  HENT:     Head: Normocephalic.     Nose: Nasal tenderness, mucosal edema, congestion and rhinorrhea present.     Right Turbinates: Swollen. Not enlarged.     Left Turbinates: Enlarged and swollen.     Right Sinus: Maxillary sinus tenderness present. No frontal sinus tenderness.     Left Sinus: Maxillary sinus tenderness present. No frontal sinus tenderness.     Mouth/Throat:     Mouth: Mucous membranes are moist.  Eyes:     Extraocular Movements: Extraocular movements intact.     Conjunctiva/sclera: Conjunctivae normal.  Cardiovascular:     Rate and Rhythm: Normal rate.  Pulmonary:     Effort: Pulmonary effort is normal. No respiratory distress.  Skin:    General: Skin is warm and dry.  Neurological:     General: No focal deficit present.     Mental Status: She is alert and oriented to person, place, and time.  Psychiatric:        Mood and Affect: Mood normal.     Procedures  No results found. However, due to the size of the patient record, not all encounters were searched. Please check Results Review for a complete set of results.  Assessment and Plan :   1. Acute non-recurrent pansinusitis (Primary) - azelastine (ASTELIN) 0.1 % nasal spray; Place 1 spray into both nostrils 2 (two) times daily. Use in each  nostril as directed  Dispense: 30 mL; Refill: 1 - predniSONE (DELTASONE) 20 MG tablet; Take 2 tablets (40 mg total) by mouth daily for 5 days.  Dispense: 10 tablet; Refill: 0 - doxycycline (VIBRAMYCIN) 100 MG capsule; Take 1 capsule (100 mg total) by mouth 2 (two) times daily for 7 days.  Dispense: 14 capsule; Refill: 0 - Ambulatory referral to ENT for follow-up evaluation if symptoms do not resolve with current medication therapy. -Continue to monitor symptoms for any change in severity if there is any escalation of current symptoms or development of new symptoms follow-up in ER for further evaluation and management.  Valerie [name]   Valerie [name], [place], [place] 03/25/24 [phone]

## 2024-03-27 ENCOUNTER — Ambulatory Visit (INDEPENDENT_AMBULATORY_CARE_PROVIDER_SITE_OTHER)

## 2024-03-27 DIAGNOSIS — D510 Vitamin B12 deficiency anemia due to intrinsic factor deficiency: Secondary | ICD-10-CM | POA: Diagnosis not present

## 2024-03-27 MED ORDER — CYANOCOBALAMIN 1000 MCG/ML IJ SOLN
1000.0000 ug | Freq: Once | INTRAMUSCULAR | Status: AC
  Administered 2024-03-27: 1000 ug via INTRAMUSCULAR

## 2024-03-27 NOTE — Progress Notes (Signed)
Patient here for monthly B12 injection per Dr. Jones.  B12 1000 mcg given in left IM and patient tolerated injection well today.  

## 2024-03-27 NOTE — Telephone Encounter (Signed)
 Pharmacy Patient Advocate Encounter  Received notification from [employer]/RX ADVANCE that Prior Authorization for Kerendia 20mg  has been DENIED.  Full denial letter will be uploaded to the media tab. See denial reason below.

## 2024-03-28 NOTE — Telephone Encounter (Signed)
 Patient gets this medication through Patient assistance

## 2024-04-03 DIAGNOSIS — J019 Acute sinusitis, unspecified: Secondary | ICD-10-CM | POA: Diagnosis not present

## 2024-04-03 DIAGNOSIS — J343 Hypertrophy of nasal turbinates: Secondary | ICD-10-CM | POA: Diagnosis not present

## 2024-04-03 DIAGNOSIS — J302 Other seasonal allergic rhinitis: Secondary | ICD-10-CM | POA: Insufficient documentation

## 2024-04-07 DIAGNOSIS — H35033 Hypertensive retinopathy, bilateral: Secondary | ICD-10-CM | POA: Diagnosis not present

## 2024-04-07 DIAGNOSIS — H33193 Other retinoschisis and retinal cysts, bilateral: Secondary | ICD-10-CM | POA: Diagnosis not present

## 2024-04-07 DIAGNOSIS — Z961 Presence of intraocular lens: Secondary | ICD-10-CM | POA: Diagnosis not present

## 2024-04-07 DIAGNOSIS — H401132 Primary open-angle glaucoma, bilateral, moderate stage: Secondary | ICD-10-CM | POA: Diagnosis not present

## 2024-04-07 DIAGNOSIS — E119 Type 2 diabetes mellitus without complications: Secondary | ICD-10-CM | POA: Diagnosis not present

## 2024-04-07 LAB — HM DIABETES EYE EXAM

## 2024-04-11 ENCOUNTER — Other Ambulatory Visit (INDEPENDENT_AMBULATORY_CARE_PROVIDER_SITE_OTHER): Admitting: Pharmacist

## 2024-04-11 DIAGNOSIS — E118 Type 2 diabetes mellitus with unspecified complications: Secondary | ICD-10-CM

## 2024-04-11 MED ORDER — TRULICITY 1.5 MG/0.5ML ~~LOC~~ SOAJ: 1.5000 mg | SUBCUTANEOUS | 1 refills | Status: DC

## 2024-04-11 MED ORDER — SYNJARDY 12.5-500 MG PO TABS: 1.0000 | ORAL_TABLET | Freq: Two times a day (BID) | ORAL | 1 refills | Status: AC

## 2024-04-11 NOTE — Progress Notes (Signed)
   04/11/2024 Name: Valerie [name] MRN: [id number] DOB: 30-Dec-1945  Chief Complaint  Patient presents with   Medication access    AVANTIKA TRUGLIO is a 78 y.o. year old female who was referred for medication management by their primary care provider, Arcadio Knuckles, MD. They presented for a face to face visit today.   They were referred to the pharmacist by their PCP for assistance in managing medication access    Subjective:  Care Team: Primary Care Provider: Arcadio Knuckles, MD ; Next Scheduled Visit: 04/19/24   Medication Access/Adherence  Current Pharmacy:  [id number] [place], [place] - [address] [place] [address] Phone: [phone] Fax: [phone]  [id number] - [place]),  - [address] [place] ([place]) [place] [address] Phone: [phone] Fax: [phone]  [facility] - [place], [place] - [address] [place] [address] Phone: [phone] Fax: [phone]  [id number] [place], [place] - [address] AT [facility] [address] [place] [address] Phone: [phone] Fax: [phone]   Patient reports affordability concerns with their medications: Yes  Patient reports access/transportation concerns to their pharmacy: No  Patient reports adherence concerns with their medications:  No    Med access: Pt was enrolled in PAP for Trulicity , Synjardy , and Kerendia  last year but has not re-enrolled for this year.    Objective:  Lab Results  Component Value Date   HGBA1C 6.4 12/21/2023    Lab Results  Component Value Date   CREATININE 0.94 03/08/2024   BUN 10 03/08/2024   NA 141 03/08/2024   K 4.1 03/08/2024   CL 104 03/08/2024   CO2 30 03/08/2024    Lab Results  Component Value Date   CHOL 152 05/11/2023   HDL 48.90 05/11/2023   LDLCALC 56 05/05/2022    LDLDIRECT 60.0 05/11/2023   TRIG 253.0 (H) 05/11/2023   CHOLHDL 3 05/11/2023    Medications Reviewed Today   Medications were not reviewed in this encounter       Assessment/Plan:   Med Access: - Patient has [employer] plan, making her diabetes medications $0 copay. Since there is no coverage gap this year, she will be able to get Trulicity  and Synjardy  at $0 all year from her pharmacy. [facility] - Received signatures for Kerendia  PAP and faxed to [employer] PAP  Follow Up Plan: MAP Tech to f/u on status  Rainelle Bur, PharmD, BCPS, CPP Clinical Pharmacist Practitioner [place] [facility] [phone]

## 2024-04-12 ENCOUNTER — Other Ambulatory Visit (HOSPITAL_COMMUNITY): Payer: Self-pay

## 2024-04-12 ENCOUNTER — Telehealth (HOSPITAL_COMMUNITY): Payer: Self-pay | Admitting: Pharmacy Technician

## 2024-04-12 NOTE — Telephone Encounter (Signed)
 Pharmacy Patient Advocate Encounter  Received notification from [facility] ADVANTAGE/RX ADVANCE that Prior Authorization for TRULICITY  1.5MG /0.5ML PEN INJECTOR has been APPROVED from 04/11/2024 to 04/11/2025. Unable to obtain price due to refill too soon rejection, last fill date 04/11/2024 next available fill date07/12/2023.   PA #/Case ID/Reference #: [id number]

## 2024-04-13 ENCOUNTER — Telehealth: Payer: Self-pay | Admitting: Internal Medicine

## 2024-04-13 NOTE — Telephone Encounter (Signed)
 Pt states she missed a call from the pharmacist, [place], and is requesting a return call.  [phone] (Home Phone)

## 2024-04-16 ENCOUNTER — Other Ambulatory Visit: Payer: Self-pay | Admitting: Internal Medicine

## 2024-04-18 NOTE — Telephone Encounter (Signed)
 Returned pts call. She may have heard a VM from me from last week however nothing is currently needed. Did confirm she was able to get her Synjardy  for $0 and already received Kerendia  from PAP. She is waiting to hear from her pharmacy about Trulicity  - should be $0 through her insurance. Advised her to call if any issues.  Rainelle Bur, PharmD, BCPS, CPP Clinical Pharmacist Practitioner [facility] at [facility] [phone]

## 2024-04-19 ENCOUNTER — Ambulatory Visit: Payer: PPO | Admitting: Internal Medicine

## 2024-04-19 ENCOUNTER — Encounter: Payer: Self-pay | Admitting: Internal Medicine

## 2024-04-19 VITALS — BP 132/88 | HR 74 | Temp 97.5°F | Ht 66.0 in | Wt 212.0 lb

## 2024-04-19 DIAGNOSIS — Z7984 Long term (current) use of oral hypoglycemic drugs: Secondary | ICD-10-CM

## 2024-04-19 DIAGNOSIS — E785 Hyperlipidemia, unspecified: Secondary | ICD-10-CM | POA: Diagnosis not present

## 2024-04-19 DIAGNOSIS — D510 Vitamin B12 deficiency anemia due to intrinsic factor deficiency: Secondary | ICD-10-CM | POA: Diagnosis not present

## 2024-04-19 DIAGNOSIS — R5382 Chronic fatigue, unspecified: Secondary | ICD-10-CM | POA: Insufficient documentation

## 2024-04-19 DIAGNOSIS — E039 Hypothyroidism, unspecified: Secondary | ICD-10-CM

## 2024-04-19 DIAGNOSIS — E118 Type 2 diabetes mellitus with unspecified complications: Secondary | ICD-10-CM

## 2024-04-19 LAB — URINALYSIS, ROUTINE W REFLEX MICROSCOPIC
Bilirubin Urine: NEGATIVE
Hgb urine dipstick: NEGATIVE
Ketones, ur: NEGATIVE
Leukocytes,Ua: NEGATIVE
Nitrite: NEGATIVE
Specific Gravity, Urine: <=1.005 — AB (ref 1.000–1.030)
Total Protein, Urine: NEGATIVE
Urine Glucose: >=1000 — AB
Urobilinogen, UA: 0.2 (ref 0.0–1.0)
pH: 6 (ref 5.0–8.0)

## 2024-04-19 LAB — CBC WITH DIFFERENTIAL/PLATELET
Basophils Absolute: 0.1 10*3/uL (ref 0.0–0.1)
Basophils Relative: 1 % (ref 0.0–3.0)
Eosinophils Absolute: 0.3 10*3/uL (ref 0.0–0.7)
Eosinophils Relative: 3.9 % (ref 0.0–5.0)
HCT: 34.4 % — ABNORMAL LOW (ref 36.0–46.0)
Hemoglobin: 11.3 g/dL — ABNORMAL LOW (ref 12.0–15.0)
Lymphocytes Relative: 28.5 % (ref 12.0–46.0)
Lymphs Abs: 2.1 10*3/uL (ref 0.7–4.0)
MCHC: 32.8 g/dL (ref 30.0–36.0)
MCV: 87.8 fl (ref 78.0–100.0)
Monocytes Absolute: 0.7 10*3/uL (ref 0.1–1.0)
Monocytes Relative: 9.2 % (ref 3.0–12.0)
Neutro Abs: 4.2 10*3/uL (ref 1.4–7.7)
Neutrophils Relative %: 57.4 % (ref 43.0–77.0)
Platelets: 375 10*3/uL (ref 150.0–400.0)
RBC: 3.91 Mil/uL (ref 3.87–5.11)
RDW: 16.4 % — ABNORMAL HIGH (ref 11.5–15.5)
WBC: 7.4 10*3/uL (ref 4.0–10.5)

## 2024-04-19 LAB — BASIC METABOLIC PANEL WITH GFR
BUN: 9 mg/dL (ref 6–23)
CO2: 29 meq/L (ref 19–32)
Calcium: 9.7 mg/dL (ref 8.4–10.5)
Chloride: 100 meq/L (ref 96–112)
Creatinine, Ser: 0.94 mg/dL (ref 0.40–1.20)
GFR: 59.1 mL/min — ABNORMAL LOW (ref >60.00)
Glucose, Bld: 96 mg/dL (ref 70–99)
Potassium: 3.9 meq/L (ref 3.5–5.1)
Sodium: 137 meq/L (ref 135–145)

## 2024-04-19 LAB — LIPID PANEL
Cholesterol: 167 mg/dL (ref 0–200)
HDL: 51.7 mg/dL (ref >39.00)
LDL Cholesterol: 71 mg/dL (ref 0–99)
NonHDL: 115.52
Total CHOL/HDL Ratio: 3
Triglycerides: 223 mg/dL — ABNORMAL HIGH (ref 0.0–149.0)
VLDL: 44.6 mg/dL — ABNORMAL HIGH (ref 0.0–40.0)

## 2024-04-19 LAB — MICROALBUMIN / CREATININE URINE RATIO
Creatinine,U: 38.1 mg/dL
Microalb Creat Ratio: UNDETERMINED mg/g (ref 0.0–30.0)
Microalb, Ur: <0.7 mg/dL

## 2024-04-19 LAB — HEMOGLOBIN A1C: Hgb A1c MFr Bld: 5.8 % (ref 4.6–6.5)

## 2024-04-19 LAB — TSH: TSH: 3.3 u[IU]/mL (ref 0.35–5.50)

## 2024-04-19 NOTE — Progress Notes (Addendum)
 Subjective:  Patient ID: Valerie [name], female    DOB: 10-21-1946  Age: 78 y.o. MRN: [id number]  CC: Follow-up (F/U.  C/o feeling fatigue.  )   HPI Valerie [name] presents for f/up ----  Discussed the use of AI scribe software for clinical note transcription with the patient, who gave verbal consent to proceed.  History of Present Illness   Valerie [name] is a 78 year old female who presents with fatigue.  She has been experiencing fatigue for a few weeks, describing it as 'very fatigued' with occasional lightheadedness. No excessive thirst, excessive urination, or significant changes in weight or appetite, although she notes a weight of 212 pounds.  She has a history of chest pain for which she takes medication prescribed by her cardiologist. She experiences lightheadedness intermittently.  A few weeks ago, she had an episode of acute sinusitis with symptoms including facial swelling and periorbital ecchymosis. She was treated with antibiotics, nasal spray, and another unspecified medication, and she has completed the course of antibiotics without any side effects.  No history of thyroid  disease. Her recent eye exam was normal, with no need for follow-up for a year.  No chest pain, shortness of breath, dizziness, constipation, and recent changes in weight or appetite. She reports occasional lightheadedness.       Outpatient Medications Prior to Visit  Medication Sig Dispense Refill   albuterol  (PROVENTIL ) (2.5 MG/3ML) 0.083% nebulizer solution Take 3 mLs (2.5 mg total) by nebulization every 6 (six) hours as needed for wheezing or shortness of breath. 75 mL 3   albuterol  (VENTOLIN  HFA) 108 (90 Base) MCG/ACT inhaler Inhale 2 puffs into the lungs every 6 (six) hours as needed. For shortness of breath 3 each 4   aspirin  81 MG tablet Take 81 mg by mouth daily.      atorvastatin  (LIPITOR) 20 MG tablet TAKE 1 TABLET BY MOUTH EVERY DAY 90 tablet 0   azelastine  (ASTELIN ) 0.1 % nasal  spray Place 1 spray into both nostrils 2 (two) times daily. Use in each nostril as directed 30 mL 1   Blood Glucose Calibration (ONETOUCH VERIO) LIQD 1 Act by In Vitro route daily. 1 each 5   Blood Glucose Monitoring Suppl (ONETOUCH VERIO FLEX SYSTEM) DEVI 1 Act by Does not apply route 2 (two) times daily. 1 each 3   budesonide -formoterol  (SYMBICORT ) 80-4.5 MCG/ACT inhaler Inhale 2 puffs into the lungs 2 (two) times daily as needed. 3 each 1   carvedilol  (COREG ) 25 MG tablet TAKE 1 TABLET BY MOUTH TWICE A DAY WITH FOOD 180 tablet 0   Cholecalciferol  (VITAMIN D3) 50 MCG (2000 UT) capsule TAKE 1 CAPSULE BY MOUTH EVERY DAY 90 capsule 1   Dulaglutide  (TRULICITY ) 1.5 MG/0.5ML SOAJ Inject 1.5 mg into the skin once a week. 6 mL 1   Empagliflozin -metFORMIN  HCl (SYNJARDY ) 12.5-500 MG TABS Take 1 tablet by mouth 2 (two) times daily. 180 tablet 1   famotidine  (PEPCID ) 40 MG tablet TAKE 1 TABLET BY MOUTH EVERY DAY 90 tablet 1   ferrous sulfate  325 (65 FE) MG tablet TAKE 1 TABLET BY MOUTH 3 TIMES DAILY WITH MEALS. 270 tablet 0   Finerenone  (KERENDIA ) 20 MG TABS Take 1 tablet (20 mg total) by mouth daily. 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 DX: E11.8 300 strip 1   irbesartan  (AVAPRO ) 300 MG tablet Take 0.5 tablets (150 mg total) by mouth daily. (Patient taking differently: Take 150 mg by mouth daily as needed.) 45 tablet 1   isosorbide  mononitrate (IMDUR ) 30 MG 24 hr tablet Take 0.5 tablets (15 mg total) by mouth daily. (Patient taking differently: Take 30 mg by mouth daily.) 45 tablet 3   latanoprost  (XALATAN ) 0.005 % ophthalmic solution Place 1 drop into both eyes at bedtime.      ONETOUCH VERIO test strip 1 EACH BY OTHER ROUTE 2 (TWO) TIMES DAILY. USE AS INSTRUCTED 200 strip 1   oxyCODONE -acetaminophen  (PERCOCET/ROXICET)  5-325 MG tablet Take 1 tablet by mouth every 8 (eight) hours as needed for severe pain (pain score 7-10). 90 tablet 0   pantoprazole  (PROTONIX ) 40 MG tablet TAKE 1 TABLET BY MOUTH 2 TIMES DAILY BEFORE A MEAL 180 tablet 0   potassium chloride  SA (KLOR-CON  M) 20 MEQ tablet TAKE 1 TABLET BY MOUTH EVERY DAY 90 tablet 0   No facility-administered medications prior to visit.    ROS Review of Systems  Constitutional:  Positive for fatigue. Negative for appetite change, chills, diaphoresis and unexpected weight change.  HENT: Negative.    Respiratory: Negative.  Negative for cough, chest tightness, shortness of breath and wheezing.   Cardiovascular:  Negative for chest pain, palpitations and leg swelling.  Gastrointestinal:  Negative for abdominal pain, constipation, diarrhea, nausea and vomiting.  Endocrine: Negative.   Genitourinary: Negative.  Negative for difficulty urinating.  Musculoskeletal:  Positive for arthralgias and back pain. Negative for myalgias.  Skin: Negative.  Negative for color change and pallor.  Neurological:  Positive for light-headedness. Negative for dizziness, weakness and headaches.  Hematological:  Negative for adenopathy. Does not bruise/bleed easily.  Psychiatric/Behavioral: Negative.      Objective:  BP 132/88   Pulse 74   Temp (!) 97.5 F (36.4 C) (Oral)   Ht 5\' 6"  (1.676 m)   Wt 212 lb (96.2 kg)   SpO2 100%   BMI 34.22 kg/m   BP Readings from Last 3 Encounters:  04/19/24 132/88  03/25/24 (!) 155/78  03/15/24 132/78    Wt Readings from Last 3 Encounters:  04/19/24 212 lb (96.2 kg)  03/15/24 217 lb 6.4 oz (98.6 kg)  03/14/24 217 lb 6.4 oz (98.6 kg)    Physical Exam Vitals reviewed.  Constitutional:      Appearance: Normal appearance.  Eyes:     General: No scleral icterus.    Conjunctiva/sclera: Conjunctivae normal.  Cardiovascular:     Rate and Rhythm: Normal rate and regular rhythm.     Heart sounds: No murmur heard.    No friction rub.  No gallop.  Pulmonary:     Effort: Pulmonary effort is normal.     Breath sounds: No stridor. No wheezing, rhonchi or rales.  Abdominal:     General: Abdomen is protuberant. There is no distension. There are no signs of injury.     Palpations: There is no mass.     Tenderness: There is no abdominal tenderness. There is no guarding.     Hernia: No hernia is present.  Musculoskeletal:        General: Deformity (DJD) present. No tenderness.     Cervical back: Neck supple.     Right lower leg: No edema.     Left lower leg: No edema.  Lymphadenopathy:     Cervical: No cervical adenopathy.  Skin:  General: Skin is warm.     Findings: No rash.  Neurological:     General: No focal deficit present.     Mental Status: She is alert.  Psychiatric:        Mood and Affect: Mood normal.        Behavior: Behavior normal.     Lab Results  Component Value Date   WBC 7.4 04/19/2024   HGB 11.3 (L) 04/19/2024   HCT 34.4 (L) 04/19/2024   PLT 375.0 04/19/2024   GLUCOSE 96 04/19/2024   CHOL 167 04/19/2024   TRIG 223.0 (H) 04/19/2024   HDL 51.70 04/19/2024   LDLDIRECT 60.0 05/11/2023   LDLCALC 71 04/19/2024   ALT 15 12/21/2023   AST 17 12/21/2023   NA 137 04/19/2024   K 3.9 04/19/2024   CL 100 04/19/2024   CREATININE 0.94 04/19/2024   BUN 9 04/19/2024   CO2 29 04/19/2024   TSH 3.30 04/19/2024   INR 1.1 (H) 04/10/2021   HGBA1C 5.8 04/19/2024   MICROALBUR <0.7 04/19/2024    No results found.  Assessment & Plan:  Chronic fatigue- O/T the anemia no cause for fatigue noted. -     Basic metabolic panel with GFR; Future -     TSH; Future  Acquired hypothyroidism- She is euthyroid. -     TSH; Future  Vitamin B12 deficiency anemia due to intrinsic factor deficiency -     CBC with Differential/Platelet; Future -     Urinalysis, Routine w reflex microscopic; Future  Hyperlipidemia with target LDL less than 100- LDL goal achieved. Doing well on the statin  -     Lipid panel;  Future  Type II diabetes mellitus with manifestations (HCC)- Blood sugar is very well controlled. -     Basic metabolic panel with GFR; Future -     Urinalysis, Routine w reflex microscopic; Future -     Hemoglobin A1c; Future -     Microalbumin / creatinine urine ratio; Future     Follow-up: Return in about 3 months (around 07/20/2024).  Sandra Crouch, MD

## 2024-04-19 NOTE — Patient Instructions (Signed)

## 2024-04-26 ENCOUNTER — Ambulatory Visit (INDEPENDENT_AMBULATORY_CARE_PROVIDER_SITE_OTHER)

## 2024-04-26 DIAGNOSIS — E538 Deficiency of other specified B group vitamins: Secondary | ICD-10-CM

## 2024-04-26 DIAGNOSIS — D510 Vitamin B12 deficiency anemia due to intrinsic factor deficiency: Secondary | ICD-10-CM

## 2024-04-26 MED ORDER — CYANOCOBALAMIN 1000 MCG/ML IJ SOLN
1000.0000 ug | Freq: Once | INTRAMUSCULAR | Status: AC
  Administered 2024-04-26: 1000 ug via INTRAMUSCULAR

## 2024-04-26 NOTE — Progress Notes (Signed)
Pt was given B12 injection with no complications.

## 2024-05-17 ENCOUNTER — Other Ambulatory Visit: Payer: Self-pay | Admitting: Internal Medicine

## 2024-05-17 DIAGNOSIS — E876 Hypokalemia: Secondary | ICD-10-CM

## 2024-05-17 DIAGNOSIS — I10 Essential (primary) hypertension: Secondary | ICD-10-CM

## 2024-06-08 ENCOUNTER — Ambulatory Visit (INDEPENDENT_AMBULATORY_CARE_PROVIDER_SITE_OTHER): Admitting: Internal Medicine

## 2024-06-08 ENCOUNTER — Encounter: Payer: Self-pay | Admitting: Internal Medicine

## 2024-06-08 ENCOUNTER — Other Ambulatory Visit: Payer: Self-pay | Admitting: Internal Medicine

## 2024-06-08 VITALS — BP 132/74 | HR 79 | Temp 98.8°F | Resp 16 | Ht 66.0 in | Wt 204.2 lb

## 2024-06-08 DIAGNOSIS — Z0001 Encounter for general adult medical examination with abnormal findings: Secondary | ICD-10-CM

## 2024-06-08 DIAGNOSIS — I10 Essential (primary) hypertension: Secondary | ICD-10-CM

## 2024-06-08 DIAGNOSIS — M150 Primary generalized (osteo)arthritis: Secondary | ICD-10-CM

## 2024-06-08 DIAGNOSIS — Z800 Family history of malignant neoplasm of digestive organs: Secondary | ICD-10-CM | POA: Insufficient documentation

## 2024-06-08 DIAGNOSIS — M48061 Spinal stenosis, lumbar region without neurogenic claudication: Secondary | ICD-10-CM | POA: Diagnosis not present

## 2024-06-08 DIAGNOSIS — D510 Vitamin B12 deficiency anemia due to intrinsic factor deficiency: Secondary | ICD-10-CM | POA: Diagnosis not present

## 2024-06-08 DIAGNOSIS — E785 Hyperlipidemia, unspecified: Secondary | ICD-10-CM

## 2024-06-08 MED ORDER — OXYCODONE-ACETAMINOPHEN 5-325 MG PO TABS: 1.0000 | ORAL_TABLET | Freq: Three times a day (TID) | ORAL | 0 refills | Status: AC | PRN

## 2024-06-08 MED ORDER — CYANOCOBALAMIN 1000 MCG/ML IJ SOLN
1000.0000 ug | Freq: Once | INTRAMUSCULAR | Status: AC
  Administered 2024-06-08: 1000 ug via INTRAMUSCULAR

## 2024-06-08 MED ORDER — ATORVASTATIN CALCIUM 20 MG PO TABS
20.0000 mg | ORAL_TABLET | Freq: Every day | ORAL | 0 refills | Status: DC
Start: 2024-06-08 — End: 2024-09-07

## 2024-06-08 NOTE — Progress Notes (Signed)
 Subjective:  Patient ID: Valerie [name], female    DOB: 1946-01-23  Age: 78 y.o. MRN: [id number]  CC: Hypertension, Annual Exam, Anemia, Back Pain, Hyperlipidemia, and Gastroesophageal Reflux   HPI Valerie [name] presents for a CPX and f/up ----  Discussed the use of AI scribe software for clinical note transcription with the patient, who gave verbal consent to proceed.  History of Present Illness   Valerie [name] is a 78 year old female who presents with lightheadedness and dizziness.  She experiences significant lightheadedness and dizziness, feeling as though she is about to pass out. Her blood pressure readings have been on the low side. She also mentions unintended weight loss, which she attributes to a decreased appetite and reduced food intake, stating 'I'm just not hungry.'  She has chest pain located in the center of her chest, under the lower breast bone. No trouble swallowing, painful swallowing, or sensation of food getting stuck in her esophagus. Occasional heartburn and indigestion are present, for which she takes pantoprazole , and she feels it is effective.  She has a history of spinal stenosis, which causes back pain. She takes oxycodone  for pain management and confirms that the pain does not radiate into her legs or feet. No numbness, weakness, or tingling.       Outpatient Medications Prior to Visit  Medication Sig Dispense Refill   albuterol  (PROVENTIL ) (2.5 MG/3ML) 0.083% nebulizer solution Take 3 mLs (2.5 mg total) by nebulization every 6 (six) hours as needed for wheezing or shortness of breath. 75 mL 3   albuterol  (VENTOLIN  HFA) 108 (90 Base) MCG/ACT inhaler Inhale 2 puffs into the lungs every 6 (six) hours as needed. For shortness of breath 3 each 4   aspirin  81 MG tablet Take 81 mg by mouth daily.      azelastine  (ASTELIN ) 0.1 % nasal spray Place 1 spray into both nostrils 2 (two) times daily. Use in each nostril as directed 30 mL 1   Blood Glucose Calibration  (ONETOUCH VERIO) LIQD 1 Act by In Vitro route daily. 1 each 5   Blood Glucose Monitoring Suppl (ONETOUCH VERIO FLEX SYSTEM) DEVI 1 Act by Does not apply route 2 (two) times daily. 1 each 3   budesonide -formoterol  (SYMBICORT ) 80-4.5 MCG/ACT inhaler Inhale 2 puffs into the lungs 2 (two) times daily as needed. 3 each 1   carvedilol  (COREG ) 25 MG tablet TAKE 1 TABLET BY MOUTH TWICE A DAY WITH FOOD 180 tablet 0   Cholecalciferol  (VITAMIN D3) 50 MCG (2000 UT) capsule TAKE 1 CAPSULE BY MOUTH EVERY DAY 90 capsule 1   Dulaglutide  (TRULICITY ) 1.5 MG/0.5ML SOAJ Inject 1.5 mg into the skin once a week. 6 mL 1   Empagliflozin -metFORMIN  HCl (SYNJARDY ) 12.5-500 MG TABS Take 1 tablet by mouth 2 (two) times daily. 180 tablet 1   famotidine  (PEPCID ) 40 MG tablet TAKE 1 TABLET BY MOUTH EVERY DAY 90 tablet 1   ferrous sulfate  325 (65 FE) MG tablet TAKE 1 TABLET BY MOUTH 3 TIMES DAILY WITH MEALS. 270 tablet 0   Finerenone  (KERENDIA ) 20 MG TABS Take 1 tablet (20 mg total) by mouth daily. 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 DX: E11.8 300 strip 1   isosorbide  mononitrate (IMDUR ) 30 MG 24 hr tablet Take 0.5  tablets (15 mg total) by mouth daily. (Patient taking differently: Take 30 mg by mouth daily.) 45 tablet 3   latanoprost  (XALATAN ) 0.005 % ophthalmic solution Place 1 drop into both eyes at bedtime.      ONETOUCH VERIO test strip 1 EACH BY OTHER ROUTE 2 (TWO) TIMES DAILY. USE AS INSTRUCTED 200 strip 1   pantoprazole  (PROTONIX ) 40 MG tablet TAKE 1 TABLET BY MOUTH 2 TIMES DAILY BEFORE A MEAL 180 tablet 0   potassium chloride  SA (KLOR-CON  M) 20 MEQ tablet TAKE 1 TABLET BY MOUTH EVERY DAY 90 tablet 0   atorvastatin  (LIPITOR) 20 MG tablet TAKE 1 TABLET BY MOUTH EVERY DAY 90 tablet 0   irbesartan  (AVAPRO ) 300 MG tablet Take 0.5 tablets  (150 mg total) by mouth daily. (Patient taking differently: Take 150 mg by mouth daily as needed.) 45 tablet 1   oxyCODONE -acetaminophen  (PERCOCET/ROXICET) 5-325 MG tablet Take 1 tablet by mouth every 8 (eight) hours as needed for severe pain (pain score 7-10). 90 tablet 0   No facility-administered medications prior to visit.    ROS Review of Systems  Constitutional:  Positive for unexpected weight change (wt loss).  HENT:  Negative for trouble swallowing and voice change.   Respiratory: Negative.  Negative for cough, chest tightness and wheezing.   Cardiovascular:  Negative for chest pain and palpitations.  Gastrointestinal:  Negative for abdominal pain, blood in stool, constipation, diarrhea, nausea and vomiting.  Genitourinary:  Negative for difficulty urinating, dysuria and hematuria.  Musculoskeletal:  Positive for arthralgias, back pain and gait problem. Negative for myalgias.  Skin: Negative.   Neurological:  Positive for dizziness and light-headedness.  Hematological:  Negative for adenopathy. Does not bruise/bleed easily.  Psychiatric/Behavioral: Negative.      Objective:  BP 132/74 (BP Location: Left Arm, Patient Position: Sitting, Cuff Size: Normal)   Pulse 79   Temp 98.8 F (37.1 C) (Temporal)   Resp 16   Ht 5' 6 (1.676 m)   Wt 204 lb 3.2 oz (92.6 kg)   SpO2 99%   BMI 32.96 kg/m   BP Readings from Last 3 Encounters:  06/08/24 132/74  04/19/24 132/88  03/25/24 (!) 155/78    Wt Readings from Last 3 Encounters:  06/08/24 204 lb 3.2 oz (92.6 kg)  04/19/24 212 lb (96.2 kg)  03/15/24 217 lb 6.4 oz (98.6 kg)    Physical Exam Vitals reviewed.  Constitutional:      Appearance: Normal appearance.  HENT:     Nose: Nose normal.     Mouth/Throat:     Mouth: Mucous membranes are moist.   Eyes:     General: No scleral icterus.    Conjunctiva/sclera: Conjunctivae normal.    Cardiovascular:     Rate and Rhythm: Normal rate and regular rhythm.     Heart  sounds: No murmur heard.    No friction rub. No gallop.  Pulmonary:     Effort: Pulmonary effort is normal.     Breath sounds: No stridor. No wheezing, rhonchi or rales.  Abdominal:     General: Abdomen is protuberant. There is no distension.     Palpations: There is no mass.     Tenderness: There is no abdominal tenderness. There is no guarding.     Hernia: No hernia is present.   Musculoskeletal:        General: Normal range of motion.     Cervical back: Neck supple.     Right lower leg: No edema.  Left lower leg: No edema.  Lymphadenopathy:     Cervical: No cervical adenopathy.   Skin:    General: Skin is warm and dry.   Neurological:     General: No focal deficit present.     Mental Status: She is alert.   Psychiatric:        Mood and Affect: Mood normal.        Behavior: Behavior normal.     Lab Results  Component Value Date   WBC 7.4 04/19/2024   HGB 11.3 (L) 04/19/2024   HCT 34.4 (L) 04/19/2024   PLT 375.0 04/19/2024   GLUCOSE 96 04/19/2024   CHOL 167 04/19/2024   TRIG 223.0 (H) 04/19/2024   HDL 51.70 04/19/2024   LDLDIRECT 60.0 05/11/2023   LDLCALC 71 04/19/2024   ALT 15 12/21/2023   AST 17 12/21/2023   NA 137 04/19/2024   K 3.9 04/19/2024   CL 100 04/19/2024   CREATININE 0.94 04/19/2024   BUN 9 04/19/2024   CO2 29 04/19/2024   TSH 3.30 04/19/2024   INR 1.1 (H) 04/10/2021   HGBA1C 5.8 04/19/2024   MICROALBUR <0.7 04/19/2024    No results found.  Assessment & Plan:  Encounter for general adult medical examination with abnormal findings- Exam completed, labs reviewed, vaccines reviewed and updated, cancer screenings are UTD, pt ed material was given.   Essential hypertension, benign- BP is over-controlled. Will discontinue the ARB.  Vitamin B12 deficiency anemia due to intrinsic factor deficiency -     Cyanocobalamin   Hyperlipidemia with target LDL less than 100- LDL goal achieved. Doing well on the statin  -     Atorvastatin  Calcium ; Take  1 tablet (20 mg total) by mouth daily.  Dispense: 90 tablet; Refill: 0  Spinal stenosis of lumbar 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 -     oxyCODONE -Acetaminophen ; Take 1 tablet by mouth every 8 (eight) hours as needed for severe pain (pain score 7-10).  Dispense: 90 tablet; Refill: 0  Primary osteoarthritis involving multiple joints -     oxyCODONE -Acetaminophen ; Take 1 tablet by mouth every 8 (eight) hours as needed for severe pain (pain score 7-10).  Dispense: 90 tablet; Refill: 0     Follow-up: Return in about 6 months (around 12/08/2024).  Debby Molt, MD

## 2024-06-08 NOTE — Patient Instructions (Signed)

## 2024-06-19 ENCOUNTER — Other Ambulatory Visit: Payer: Self-pay | Admitting: Internal Medicine

## 2024-06-19 DIAGNOSIS — K2100 Gastro-esophageal reflux disease with esophagitis, without bleeding: Secondary | ICD-10-CM

## 2024-06-20 ENCOUNTER — Other Ambulatory Visit: Payer: Self-pay | Admitting: Internal Medicine

## 2024-06-20 DIAGNOSIS — I10 Essential (primary) hypertension: Secondary | ICD-10-CM

## 2024-07-10 ENCOUNTER — Ambulatory Visit (INDEPENDENT_AMBULATORY_CARE_PROVIDER_SITE_OTHER)

## 2024-07-10 DIAGNOSIS — D510 Vitamin B12 deficiency anemia due to intrinsic factor deficiency: Secondary | ICD-10-CM

## 2024-07-10 MED ORDER — CYANOCOBALAMIN 1000 MCG/ML IJ SOLN
1000.0000 ug | Freq: Once | INTRAMUSCULAR | Status: AC
  Administered 2024-07-10: 1000 ug via INTRAMUSCULAR

## 2024-07-10 NOTE — Progress Notes (Signed)
 Patient visits today for their b-12 injection. Patient informed of what she had received and tolerated injection well. Patient notified to reach out to office if needed.

## 2024-07-19 ENCOUNTER — Ambulatory Visit: Attending: Cardiovascular Disease | Admitting: Cardiovascular Disease

## 2024-07-20 ENCOUNTER — Encounter: Payer: Self-pay | Admitting: Cardiovascular Disease

## 2024-07-20 ENCOUNTER — Ambulatory Visit: Admitting: Internal Medicine

## 2024-07-28 ENCOUNTER — Other Ambulatory Visit: Payer: Self-pay | Admitting: Internal Medicine

## 2024-08-07 ENCOUNTER — Ambulatory Visit: Attending: Cardiovascular Disease | Admitting: Cardiovascular Disease

## 2024-08-07 ENCOUNTER — Encounter: Payer: Self-pay | Admitting: Cardiovascular Disease

## 2024-08-07 VITALS — BP 114/70 | HR 83 | Ht 66.0 in | Wt 200.3 lb

## 2024-08-07 DIAGNOSIS — I10 Essential (primary) hypertension: Secondary | ICD-10-CM

## 2024-08-07 DIAGNOSIS — R931 Abnormal findings on diagnostic imaging of heart and coronary circulation: Secondary | ICD-10-CM

## 2024-08-07 DIAGNOSIS — G4733 Obstructive sleep apnea (adult) (pediatric): Secondary | ICD-10-CM | POA: Diagnosis not present

## 2024-08-07 DIAGNOSIS — E785 Hyperlipidemia, unspecified: Secondary | ICD-10-CM | POA: Diagnosis not present

## 2024-08-07 NOTE — Assessment & Plan Note (Signed)
 History of hyperlipidemia on statin therapy with lipid profile performed 04/19/2024 revealing a total cholesterol of 167, LDL 71 and HDL 51.

## 2024-08-07 NOTE — Assessment & Plan Note (Signed)
History of obstructive sleep apnea on BiPAP 

## 2024-08-07 NOTE — Assessment & Plan Note (Addendum)
 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  She says that her chest pain has gotten better since addition of Imdur .  She did have a cardiac PET study in addition performed 11/18/2023 which was low risk and nonischemic.

## 2024-08-07 NOTE — Assessment & Plan Note (Signed)
 History of essential hypertension with blood pressure measured today at 114/70.  She is on carvedilol .

## 2024-08-07 NOTE — Patient Instructions (Signed)
 Medication Instructions:  Your physician recommends that you continue on your current medications as directed. Please refer to the Current Medication list given to you today.  *If you need a refill on your cardiac medications before your next appointment, please call your pharmacy*  Follow-Up: At [facility], you and your health needs are our priority.  As part of our continuing mission to provide you with exceptional heart care, our providers are all part of one team.  This team includes your primary Cardiologist (physician) and Advanced Practice Providers or APPs (Physician Assistants and Nurse Practitioners) who all work together to provide you with the care you need, when you need it.  Your next appointment:   6 month(s)  Provider:   Marlana Silvan, NP         Then, Lauro Portal, MD will plan to see you again in 12 month(s).     We recommend signing up for the patient portal called "MyChart".  Sign up information is provided on this After Visit Summary.  MyChart is used to connect with patients for Virtual Visits (Telemedicine).  Patients are able to view lab/test results, encounter notes, upcoming appointments, etc.  Non-urgent messages can be sent to your provider as well.   To learn more about what you can do with MyChart, go to [email or web address].

## 2024-08-07 NOTE — Progress Notes (Signed)
 08/07/2024 Valerie [name]   1946/01/21  [id number]  Primary Physician Joshua Debby CROME, MD Primary Cardiologist: Dorn JINNY Lesches MD [place], [place]  HPI:  Valerie [name] is a 78 y.o.   morbidly overweight married African-American female mother 3, grandmother of 5 grandchildren who was in the textile industry and was referred by Dr. Debby Joshua for cardiovascular evaluation because of palpitations.  I last  saw her in the office 11/14/2021.  She has been evaluated by Drs. Hochrein and Raford in the past.  She saw Dr. Raford a little over 3 years ago which time she had an echo that was essentially normal and a 2-day protocol Myoview  that was normal as well.  She does have a history of treated hypertension, diabetes and hyperlipidemia.  She wears BiPAP for obstructive sleep apnea and his family history for heart disease with both father and brother who died of myocardial infarctions.  He is never had a heart attack or stroke.  She does get chronic chest pain every several days and is chronically short of breath.  She also has reactive airways disease.  He started having palpitations several months ago and these occur several times a week lasting seconds to minutes at a time.   She has had a Myoview  stress test and 2D echo all of which were normal.  She also had a monitor placed because of palpitations 10/03/2018 that showed sinus rhythm with occasional PVCs and PACs.   She had a coronary CTA performed 04/02/2022 revealing a coronary calcium  score of 454 with mild nonobstructive CAD.  12/16/2022.  In addition, she had a cardiac PET study performed 11/18/2023 which was low risk and nonischemic.  She was begun on Imdur  which resulted in improvement in her chest pain.  Symptoms have significantly improved since she was last seen.   Current Meds  Medication Sig   albuterol  (PROVENTIL ) (2.5 MG/3ML) 0.083% nebulizer solution Take 3 mLs (2.5 mg total) by nebulization every 6 (six) hours as  needed for wheezing or shortness of breath.   albuterol  (VENTOLIN  HFA) 108 (90 Base) MCG/ACT inhaler Inhale 2 puffs into the lungs every 6 (six) hours as needed. For shortness of breath   aspirin  81 MG tablet Take 81 mg by mouth daily.    atorvastatin  (LIPITOR) 20 MG tablet Take 1 tablet (20 mg total) by mouth daily.   azelastine  (ASTELIN ) 0.1 % nasal spray Place 1 spray into both nostrils 2 (two) times daily. Use in each nostril as directed   Blood Glucose Calibration (ONETOUCH VERIO) LIQD 1 Act by In Vitro route daily.   Blood Glucose Monitoring Suppl (ONETOUCH VERIO FLEX SYSTEM) DEVI 1 Act by Does not apply route 2 (two) times daily.   budesonide -formoterol  (SYMBICORT ) 80-4.5 MCG/ACT inhaler Inhale 2 puffs into the lungs 2 (two) times daily as needed.   carvedilol  (COREG ) 25 MG tablet TAKE 1 TABLET BY MOUTH TWICE A DAY WITH FOOD   Cholecalciferol  (VITAMIN D3) 50 MCG (2000 UT) capsule TAKE 1 CAPSULE BY MOUTH EVERY DAY   Dulaglutide  (TRULICITY ) 1.5 MG/0.5ML SOAJ Inject 1.5 mg into the skin once a week.   Empagliflozin -metFORMIN  HCl (SYNJARDY ) 12.5-500 MG TABS Take 1 tablet by mouth 2 (two) times daily.   famotidine  (PEPCID ) 40 MG tablet TAKE 1 TABLET BY MOUTH EVERY DAY   ferrous sulfate  325 (65 FE) MG tablet TAKE 1 TABLET BY MOUTH 3 TIMES DAILY WITH MEALS.   Finerenone  (KERENDIA ) 20 MG TABS Take 1 tablet (20  mg total) by mouth daily. Via Patient Assistance Health and safety inspector)   gabapentin  (NEURONTIN ) 300 MG capsule TAKE 1 CAPSULE BY MOUTH THREE TIMES A DAY   glucose blood (FREESTYLE TEST STRIPS) test strip USE TO TEST BLOOD SUGAR 3 TIMES A DAY. DX: E11.8   isosorbide  mononitrate (IMDUR ) 30 MG 24 hr tablet Take 0.5 tablets (15 mg total) by mouth daily. (Patient taking differently: Take 30 mg by mouth daily.)   latanoprost  (XALATAN ) 0.005 % ophthalmic solution Place 1 drop into both eyes at bedtime.    ONETOUCH VERIO test strip 1 EACH BY OTHER ROUTE 2 (TWO) TIMES DAILY. USE AS INSTRUCTED    oxyCODONE -acetaminophen  (PERCOCET/ROXICET) 5-325 MG tablet Take 1 tablet by mouth every 8 (eight) hours as needed for severe pain (pain score 7-10).   pantoprazole  (PROTONIX ) 40 MG tablet TAKE 1 TABLET BY MOUTH 2 TIMES DAILY BEFORE A MEAL   potassium chloride  SA (KLOR-CON  M) 20 MEQ tablet TAKE 1 TABLET BY MOUTH EVERY DAY     Allergies  Allergen Reactions   Food Swelling    bananas   Penicillins Swelling and Rash    Social History   Socioeconomic History   Marital status: Widowed    Spouse name: Not on file   Number of children: 3   Years of education: 11th   Highest education level: 11th grade  Occupational History   Occupation: disabled  Tobacco Use   Smoking status: Never    Passive exposure: Never   Smokeless tobacco: Never  Vaping Use   Vaping status: Never Used  Substance and Sexual Activity   Alcohol use: No    Alcohol/week: 0.0 standard drinks of alcohol   Drug use: No   Sexual activity: Not Currently    Partners: Male    Comment: not working, lives with husband, 3 sons  Other Topics Concern   Not on file  Social History Narrative   Client's spouse recently passed Feb 2021      Lives with 2 Son and 1 granddaughter   R handed   Caffeine: ocas., has cut back and drinking more water   Social Drivers of Health   Financial Resource Strain: Low Risk  (03/14/2024)   Overall Financial Resource Strain (CARDIA)    Difficulty of Paying Living Expenses: Not hard at all  Food Insecurity: Low Risk  (04/03/2024)   Received from [facility]   Hunger Vital Sign    Within the past 12 months, you worried that your food would run out before you got money to buy more: Never true    Within the past 12 months, the food you bought just didn't last and you didn't have money to get more. : Never true  Transportation Needs: No Transportation Needs (04/03/2024)   Received from [facility]    In the past 12 months, has lack of reliable transportation kept you from  medical appointments, meetings, work or from getting things needed for daily living? : No  Physical Activity: Inactive (03/14/2024)   Exercise Vital Sign    Days of Exercise per Week: 0 days    Minutes of Exercise per Session: 0 min  Stress: No Stress Concern Present (03/14/2024)   [employer] of Occupational Health - Occupational Stress Questionnaire    Feeling of Stress : Not at all  Social Connections: Moderately Isolated (03/14/2024)   Social Connection and Isolation Panel    Frequency of Communication with Friends and Family: Once a week    Frequency of Social  Gatherings with Friends and Family: Twice a week    Attends Religious Services: 1 to 4 times per year    Active Member of [employer] or Organizations: No    Attends Banker Meetings: Never    Marital Status: Widowed  Intimate Partner Violence: Not At Risk (03/14/2024)   Humiliation, Afraid, Rape, and Kick questionnaire    Fear of Current or Ex-Partner: No    Emotionally Abused: No    Physically Abused: No    Sexually Abused: No     Review of Systems: General: negative for chills, fever, night sweats or weight changes.  Cardiovascular: negative for chest pain, dyspnea on exertion, edema, orthopnea, palpitations, paroxysmal nocturnal dyspnea or shortness of breath Dermatological: negative for rash Respiratory: negative for cough or wheezing Urologic: negative for hematuria Abdominal: negative for nausea, vomiting, diarrhea, bright red blood per rectum, melena, or hematemesis Neurologic: negative for visual changes, syncope, or dizziness All other systems reviewed and are otherwise negative except as noted above.    Blood pressure 114/70, pulse 83, height 5' 6 (1.676 m), weight 200 lb 4.8 oz (90.9 kg), SpO2 100%.  General appearance: alert and no distress Neck: no adenopathy, no carotid bruit, no JVD, supple, symmetrical, trachea midline, and thyroid  not enlarged, symmetric, no tenderness/mass/nodules Lungs:  clear to auscultation bilaterally Heart: regular rate and rhythm, S1, S2 normal, no murmur, click, rub or gallop Extremities: extremities normal, atraumatic, no cyanosis or edema Pulses: 2+ and symmetric Skin: Skin color, texture, turgor normal. No rashes or lesions Neurologic: Grossly normal  EKG not performed today      ASSESSMENT AND PLAN:   Hyperlipidemia with target LDL less than 100 History of hyperlipidemia on statin therapy with lipid profile performed 04/19/2024 revealing a total cholesterol of 167, LDL 71 and HDL 51.  Essential hypertension, benign History of essential hypertension with blood pressure measured today at 114/70.  She is on carvedilol .  Obstructive sleep apnea treated with BiPAP History of obstructive sleep apnea on BiPAP  Elevated coronary artery calcium  score Elevated coronary calcium  score of 454 with mild nonobstructive disease by CTA 04/02/22 at goal for secondary prevention.  She says that her chest pain has gotten better since addition of Imdur .  She did have a cardiac PET study in addition performed 11/18/2023 which was low risk and nonischemic.     Dorn DOROTHA Lesches MD FACP,FACC,FAHA, [facility] 08/07/2024 9:50 AM

## 2024-08-09 ENCOUNTER — Encounter: Payer: Self-pay | Admitting: Internal Medicine

## 2024-08-09 ENCOUNTER — Ambulatory Visit: Admitting: Internal Medicine

## 2024-08-09 VITALS — BP 108/68 | HR 78 | Temp 98.0°F | Resp 16 | Ht 66.0 in | Wt 199.0 lb

## 2024-08-09 DIAGNOSIS — D510 Vitamin B12 deficiency anemia due to intrinsic factor deficiency: Secondary | ICD-10-CM | POA: Diagnosis not present

## 2024-08-09 DIAGNOSIS — I10 Essential (primary) hypertension: Secondary | ICD-10-CM | POA: Diagnosis not present

## 2024-08-09 DIAGNOSIS — I950 Idiopathic hypotension: Secondary | ICD-10-CM | POA: Diagnosis not present

## 2024-08-09 LAB — CBC WITH DIFFERENTIAL/PLATELET
Basophils Absolute: 0.1 K/uL (ref 0.0–0.1)
Basophils Relative: 0.7 % (ref 0.0–3.0)
Eosinophils Absolute: 0.4 K/uL (ref 0.0–0.7)
Eosinophils Relative: 4.3 % (ref 0.0–5.0)
HCT: 32.3 % — ABNORMAL LOW (ref 36.0–46.0)
Hemoglobin: 10.5 g/dL — ABNORMAL LOW (ref 12.0–15.0)
Lymphocytes Relative: 25.8 % (ref 12.0–46.0)
Lymphs Abs: 2.4 K/uL (ref 0.7–4.0)
MCHC: 32.6 g/dL (ref 30.0–36.0)
MCV: 87.8 fl (ref 78.0–100.0)
Monocytes Absolute: 0.8 K/uL (ref 0.1–1.0)
Monocytes Relative: 8.3 % (ref 3.0–12.0)
Neutro Abs: 5.6 K/uL (ref 1.4–7.7)
Neutrophils Relative %: 60.9 % (ref 43.0–77.0)
Platelets: 360 K/uL (ref 150.0–400.0)
RBC: 3.67 Mil/uL — ABNORMAL LOW (ref 3.87–5.11)
RDW: 16.2 % — ABNORMAL HIGH (ref 11.5–15.5)
WBC: 9.2 K/uL (ref 4.0–10.5)

## 2024-08-09 LAB — BASIC METABOLIC PANEL WITH GFR
BUN: 11 mg/dL (ref 6–23)
CO2: 28 meq/L (ref 19–32)
Calcium: 9.4 mg/dL (ref 8.4–10.5)
Chloride: 100 meq/L (ref 96–112)
Creatinine, Ser: 1.03 mg/dL (ref 0.40–1.20)
GFR: 52.85 mL/min — ABNORMAL LOW (ref >60.00)
Glucose, Bld: 105 mg/dL — ABNORMAL HIGH (ref 70–99)
Potassium: 4 meq/L (ref 3.5–5.1)
Sodium: 139 meq/L (ref 135–145)

## 2024-08-09 LAB — CORTISOL: Cortisol, Plasma: 8 ug/dL

## 2024-08-09 MED ORDER — CARVEDILOL 12.5 MG PO TABS: 12.5000 mg | ORAL_TABLET | Freq: Two times a day (BID) | ORAL | 0 refills | Status: AC

## 2024-08-09 MED ORDER — CYANOCOBALAMIN 1000 MCG/ML IJ SOLN
1000.0000 ug | Freq: Once | INTRAMUSCULAR | Status: AC
  Administered 2024-08-09: 1000 ug via INTRAMUSCULAR

## 2024-08-09 NOTE — Progress Notes (Unsigned)
 Subjective:  Patient ID: Valerie [name], female    DOB: Jul 17, 1946  Age: 78 y.o. MRN: [id number]  CC: Hypertension   HPI Valerie [name] presents for f/up -----  Discussed the use of AI scribe software for clinical note transcription with the patient, who gave verbal consent to proceed.  History of Present Illness Valerie [name] is a 78 year old female who presents with lightheadedness and dizziness.  She has been experiencing intermittent episodes of lightheadedness and dizziness for some time, described as feeling like she might pass out, although she has not actually lost consciousness. She mentions stumbling around the house and feeling close to passing out previously.  Her symptoms have been more frequent lately, describing them as 'goes and comes a lot here lately'. Her blood pressure was checked earlier and was reported as good.  She denies any numbness, weakness, or tingling. No recent episodes of passing out.  She was unaware of having a low heart rate until it was mentioned during the visit. She recently visited a cardiologist but did not have an EKG performed at that time.     Outpatient Medications Prior to Visit  Medication Sig Dispense Refill   albuterol  (PROVENTIL ) (2.5 MG/3ML) 0.083% nebulizer solution Take 3 mLs (2.5 mg total) by nebulization every 6 (six) hours as needed for wheezing or shortness of breath. 75 mL 3   albuterol  (VENTOLIN  HFA) 108 (90 Base) MCG/ACT inhaler Inhale 2 puffs into the lungs every 6 (six) hours as needed. For shortness of breath 3 each 4   aspirin  81 MG tablet Take 81 mg by mouth daily.      atorvastatin  (LIPITOR) 20 MG tablet Take 1 tablet (20 mg total) by mouth daily. 90 tablet 0   azelastine  (ASTELIN ) 0.1 % nasal spray Place 1 spray into both nostrils 2 (two) times daily. Use in each nostril as directed 30 mL 1   Blood Glucose Calibration (ONETOUCH VERIO) LIQD 1 Act by In Vitro route daily. 1 each 5   Blood Glucose Monitoring Suppl  (ONETOUCH VERIO FLEX SYSTEM) DEVI 1 Act by Does not apply route 2 (two) times daily. 1 each 3   budesonide -formoterol  (SYMBICORT ) 80-4.5 MCG/ACT inhaler Inhale 2 puffs into the lungs 2 (two) times daily as needed. 3 each 1   Cholecalciferol  (VITAMIN D3) 50 MCG (2000 UT) capsule TAKE 1 CAPSULE BY MOUTH EVERY DAY 90 capsule 1   Dulaglutide  (TRULICITY ) 1.5 MG/0.5ML SOAJ Inject 1.5 mg into the skin once a week. 6 mL 1   Empagliflozin -metFORMIN  HCl (SYNJARDY ) 12.5-500 MG TABS Take 1 tablet by mouth 2 (two) times daily. 180 tablet 1   famotidine  (PEPCID ) 40 MG tablet TAKE 1 TABLET BY MOUTH EVERY DAY 90 tablet 1   ferrous sulfate  325 (65 FE) MG tablet TAKE 1 TABLET BY MOUTH 3 TIMES DAILY WITH MEALS. 270 tablet 0   Finerenone  (KERENDIA ) 20 MG TABS Take 1 tablet (20 mg total) by mouth daily. 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 DX: E11.8 300 strip 1   isosorbide  mononitrate (IMDUR ) 30 MG 24 hr tablet Take 0.5 tablets (15 mg total) by mouth daily. (Patient taking differently: Take 30 mg by mouth daily.) 45 tablet 3   latanoprost  (XALATAN ) 0.005 % ophthalmic solution Place 1 drop into both  eyes at bedtime.      ONETOUCH VERIO test strip 1 EACH BY OTHER ROUTE 2 (TWO) TIMES DAILY. USE AS INSTRUCTED 200 strip 1   oxyCODONE -acetaminophen  (PERCOCET/ROXICET) 5-325 MG tablet Take 1 tablet by mouth every 8 (eight) hours as needed for severe pain (pain score 7-10). 90 tablet 0   pantoprazole  (PROTONIX ) 40 MG tablet TAKE 1 TABLET BY MOUTH 2 TIMES DAILY BEFORE A MEAL 180 tablet 0   potassium chloride  SA (KLOR-CON  M) 20 MEQ tablet TAKE 1 TABLET BY MOUTH EVERY DAY 90 tablet 0   carvedilol  (COREG ) 25 MG tablet TAKE 1 TABLET BY MOUTH TWICE A DAY WITH FOOD 180 tablet 0   No facility-administered medications prior to visit.     ROS Review of Systems  Constitutional:  Negative for appetite change, chills, diaphoresis, fatigue and fever.  HENT: Negative.    Eyes: Negative.   Respiratory:  Negative for cough, chest tightness, shortness of breath and wheezing.   Cardiovascular:  Negative for chest pain, palpitations and leg swelling.  Gastrointestinal:  Negative for abdominal pain, constipation, diarrhea, nausea and vomiting.  Endocrine: Negative.   Genitourinary: Negative.  Negative for difficulty urinating and dysuria.  Musculoskeletal:  Positive for arthralgias, back pain and gait problem.  Skin: Negative.   Neurological:  Positive for dizziness and light-headedness.  Hematological:  Negative for adenopathy. Does not bruise/bleed easily.  Psychiatric/Behavioral: Negative.      Objective:  BP 108/68 (BP Location: Left Arm, Patient Position: Sitting, Cuff Size: Normal)   Pulse 78   Temp 98 F (36.7 C) (Oral)   Resp 16   Ht 5' 6 (1.676 m)   Wt 199 lb (90.3 kg)   SpO2 99%   BMI 32.12 kg/m   BP Readings from Last 3 Encounters:  08/09/24 108/68  08/07/24 114/70  06/08/24 132/74    Wt Readings from Last 3 Encounters:  08/09/24 199 lb (90.3 kg)  08/07/24 200 lb 4.8 oz (90.9 kg)  06/08/24 204 lb 3.2 oz (92.6 kg)    Physical Exam Vitals reviewed.  Constitutional:      Appearance: Normal appearance.  HENT:     Mouth/Throat:     Mouth: Mucous membranes are moist.  Eyes:     General: No scleral icterus.    Conjunctiva/sclera: Conjunctivae normal.  Cardiovascular:     Rate and Rhythm: Normal rate and regular rhythm.     Heart sounds: Normal heart sounds, S1 normal and S2 normal. No murmur heard.    No friction rub. No gallop.     Comments: EKG---- NSR, 78 bpm No LVH, Q waves, or ST/T wave changes  Unchanged  Pulmonary:     Breath sounds: No stridor. No wheezing, rhonchi or rales.  Abdominal:     General: Abdomen is flat.     Palpations: There is no mass.     Tenderness: There is no  abdominal tenderness. There is no guarding.     Hernia: No hernia is present.  Musculoskeletal:     Cervical back: Neck supple.     Right lower leg: No edema.     Left lower leg: No edema.  Skin:    General: Skin is warm and dry.     Findings: No lesion.  Neurological:     General: No focal deficit present.     Mental Status: She is alert. Mental status is at baseline.  Psychiatric:        Mood and Affect: Mood normal.  Behavior: Behavior normal.     Lab Results  Component Value Date   WBC 9.2 08/09/2024   HGB 10.5 (L) 08/09/2024   HCT 32.3 (L) 08/09/2024   PLT 360.0 08/09/2024   GLUCOSE 105 (H) 08/09/2024   CHOL 167 04/19/2024   TRIG 223.0 (H) 04/19/2024   HDL 51.70 04/19/2024   LDLDIRECT 60.0 05/11/2023   LDLCALC 71 04/19/2024   ALT 15 12/21/2023   AST 17 12/21/2023   NA 139 08/09/2024   K 4.0 08/09/2024   CL 100 08/09/2024   CREATININE 1.03 08/09/2024   BUN 11 08/09/2024   CO2 28 08/09/2024   TSH 3.30 04/19/2024   INR 1.1 (H) 04/10/2021   HGBA1C 5.8 04/19/2024   MICROALBUR <0.7 04/19/2024    No results found.  Assessment & Plan:   Idiopathic hypotension- Her BP is over-controlled. Will lower the carvedilol  dose. Labs are normal. -     Cortisol; Future -     Basic metabolic panel with GFR; Future -     EKG 12-Lead -     AMB Referral VBCI Care Management  Vitamin B12 deficiency anemia due to intrinsic factor deficiency -     CBC with Differential/Platelet; Future -     Cyanocobalamin   Essential hypertension, benign- EKG is negative for LVH. -     Carvedilol ; Take 1 tablet (12.5 mg total) by mouth 2 (two) times daily with a meal.  Dispense: 180 tablet; Refill: 0 -     AMB Referral VBCI Care Management     Follow-up: Return in about 3 months (around 11/09/2024).  Debby Molt, MD

## 2024-08-09 NOTE — Patient Instructions (Signed)

## 2024-08-10 ENCOUNTER — Ambulatory Visit

## 2024-08-13 ENCOUNTER — Ambulatory Visit: Payer: Self-pay | Admitting: Internal Medicine

## 2024-08-17 ENCOUNTER — Other Ambulatory Visit: Payer: Self-pay | Admitting: Internal Medicine

## 2024-08-17 DIAGNOSIS — E876 Hypokalemia: Secondary | ICD-10-CM

## 2024-08-17 DIAGNOSIS — I10 Essential (primary) hypertension: Secondary | ICD-10-CM

## 2024-08-23 ENCOUNTER — Telehealth: Payer: Self-pay | Admitting: *Deleted

## 2024-08-23 NOTE — Progress Notes (Unsigned)
 Care Guide Pharmacy Note  08/23/2024 Name: Valerie [name] MRN: [id number] DOB: 11-Aug-1946  Referred By: Joshua Debby CROME, MD Reason for referral: Call Attempt #1 and Complex Care Management (Outreach to schedule referral with pharmacist )   Valerie [name] is a 78 y.o. year old female who is a primary care patient of Joshua Debby CROME, MD.  Valerie [name] was referred to the pharmacist for assistance related to: HTN  An unsuccessful telephone outreach was attempted today to contact the patient who was referred to the pharmacy team for assistance with medication management. Additional attempts will be made to contact the patient.  Thedford Franks, CMA [place]  [facility], [facility] Guide Direct Dial : [phone]  Fax: [phone] Website: [email or web address]

## 2024-08-24 NOTE — Progress Notes (Unsigned)
 Care Guide Pharmacy Note  08/24/2024 Name: Valerie [name] MRN: [id number] DOB: [birth year]  Referred By: Joshua Debby CROME, MD Reason for referral: Call Attempt #1 and Complex Care Management (Outreach to schedule referral with pharmacist )   Valerie [name] is a 78 y.o. year old female who is a primary care patient of Joshua Debby CROME, MD.  Valerie [name] was referred to the pharmacist for assistance related to: HTN  A second unsuccessful telephone outreach was attempted today to contact the patient who was referred to the pharmacy team for assistance with medication management. Additional attempts will be made to contact the patient.  Thedford Franks, CMA [place]  [facility], [facility] Guide Direct Dial : [phone]  Fax: [phone] Website: [email or web address]

## 2024-08-25 NOTE — Progress Notes (Signed)
 Care Guide Pharmacy Note  08/25/2024 Name: Valerie [name] MRN: [id number] DOB: March 31, 1946  Referred By: Joshua Debby CROME, MD Reason for referral: Call Attempt #1 and Complex Care Management (Outreach to schedule referral with pharmacist )   Valerie [name] is a 78 y.o. year old female who is a primary care patient of Joshua Debby CROME, MD.  Valerie [name] was referred to the pharmacist for assistance related to: HTN  A third unsuccessful telephone outreach was attempted today to contact the patient who was referred to the pharmacy team for assistance with medication management. The Population Health team is pleased to engage with this patient at any time in the future upon receipt of referral and should he/she be interested in assistance from the Population Health team.  Thedford Franks, CMA [facility]  [facility], [facility] Guide Direct Dial : [phone]  Fax: [phone] Website: [email or web address]

## 2024-08-31 NOTE — Progress Notes (Signed)
 Care Guide Pharmacy Note  08/31/2024 Name: Valerie [name] MRN: [id number] DOB: 1946/05/18  Referred By: Joshua Debby CROME, MD Reason for referral: Call Attempt #1 and Complex Care Management (Outreach to schedule referral with pharmacist )   [name] is a 78 y.o. year old female who is a primary care patient of Joshua Debby CROME, MD.  Heron DELENA Seip was referred to the pharmacist for assistance related to: HTN  Successful contact was made with the patient to discuss pharmacy services including being ready for the pharmacist to call at least 5 minutes before the scheduled appointment time and to have medication bottles and any blood pressure readings ready for review. The patient agreed to meet with the pharmacist via telephone visit on 09/07/2024  Thedford Franks, CMA [place]  [facility], [facility] Guide Direct Dial : [phone]  Fax: [phone] Website: [email or web address]

## 2024-09-06 ENCOUNTER — Other Ambulatory Visit: Payer: Self-pay | Admitting: Internal Medicine

## 2024-09-06 DIAGNOSIS — E785 Hyperlipidemia, unspecified: Secondary | ICD-10-CM

## 2024-09-07 ENCOUNTER — Telehealth: Payer: Self-pay | Admitting: Pharmacist

## 2024-09-07 ENCOUNTER — Other Ambulatory Visit

## 2024-09-07 NOTE — Telephone Encounter (Signed)
 Called patient for 9:30 AM telephone appointment. Left message with direct call back number on home VM, mailbox full on cell number.  Darrelyn Drum, PharmD, BCPS, CPP Clinical Pharmacist Practitioner [facility] at [facility] [phone]

## 2024-09-08 ENCOUNTER — Ambulatory Visit

## 2024-09-11 ENCOUNTER — Ambulatory Visit

## 2024-09-11 DIAGNOSIS — E538 Deficiency of other specified B group vitamins: Secondary | ICD-10-CM

## 2024-09-11 MED ORDER — CYANOCOBALAMIN 1000 MCG/ML IJ SOLN
1000.0000 ug | Freq: Once | INTRAMUSCULAR | Status: AC
  Administered 2024-09-11: 1000 ug via INTRAMUSCULAR

## 2024-09-11 NOTE — Progress Notes (Signed)
 After obtaining consent, and per orders of Dr. Joshua, injection of B12 given by Ronnald SHAUNNA [place]. Patient instructed to report any adverse reaction to me immediately.

## 2024-09-15 ENCOUNTER — Telehealth: Payer: Self-pay | Admitting: *Deleted

## 2024-09-15 NOTE — Progress Notes (Signed)
 Complex Care Management Care Guide Note  09/15/2024 Name: Valerie [name] MRN: [id number] DOB: 21-Aug-1946  Valerie [name] is a 78 y.o. year old female who is a primary care patient of Joshua Debby CROME, MD and is actively engaged with the care management team. I reached out to Heron DELENA Seip by phone today to assist with re-scheduling  with the Pharmacist.  Follow up plan: Unsuccessful telephone outreach attempt made. A HIPAA compliant phone message was left for the patient providing contact information and requesting a return call.  Thedford Franks, CMA   [facility], [facility] Guide Direct Dial : [phone]  Fax: [phone] Website: [email or web address]

## 2024-09-18 ENCOUNTER — Other Ambulatory Visit: Payer: Self-pay | Admitting: Internal Medicine

## 2024-09-18 ENCOUNTER — Ambulatory Visit (INDEPENDENT_AMBULATORY_CARE_PROVIDER_SITE_OTHER): Admitting: Radiology

## 2024-09-18 ENCOUNTER — Ambulatory Visit
Admission: EM | Admit: 2024-09-18 | Discharge: 2024-09-18 | Disposition: A | Discharge status: Home / Self Care | Attending: Nurse Practitioner | Admitting: Nurse Practitioner

## 2024-09-18 DIAGNOSIS — S8012XA Contusion of left lower leg, initial encounter: Secondary | ICD-10-CM

## 2024-09-18 DIAGNOSIS — E118 Type 2 diabetes mellitus with unspecified complications: Secondary | ICD-10-CM

## 2024-09-18 DIAGNOSIS — Y92009 Unspecified place in unspecified non-institutional (private) residence as the place of occurrence of the external cause: Secondary | ICD-10-CM

## 2024-09-18 DIAGNOSIS — S6721XA Crushing injury of right hand, initial encounter: Secondary | ICD-10-CM | POA: Diagnosis not present

## 2024-09-18 DIAGNOSIS — S6991XA Unspecified injury of right wrist, hand and finger(s), initial encounter: Secondary | ICD-10-CM | POA: Diagnosis not present

## 2024-09-18 DIAGNOSIS — W19XXXA Unspecified fall, initial encounter: Secondary | ICD-10-CM

## 2024-09-18 DIAGNOSIS — Z043 Encounter for examination and observation following other accident: Secondary | ICD-10-CM | POA: Diagnosis not present

## 2024-09-18 DIAGNOSIS — S29012A Strain of muscle and tendon of back wall of thorax, initial encounter: Secondary | ICD-10-CM

## 2024-09-18 MED ORDER — NAPROXEN 500 MG PO TABS: 500.0000 mg | ORAL_TABLET | Freq: Two times a day (BID) | ORAL | 0 refills | Status: AC

## 2024-09-18 MED ORDER — METHOCARBAMOL 500 MG PO TABS: 500.0000 mg | ORAL_TABLET | Freq: Two times a day (BID) | ORAL | 0 refills | Status: AC

## 2024-09-18 NOTE — ED Triage Notes (Signed)
 Pt presents due to a fall yesterday. C/o left lower leg pain/bruising,lower back pain, and right hand pain. States her hand is giving her the most problem because she is unable to move it.

## 2024-09-18 NOTE — Discharge Instructions (Addendum)
 Your x-ray of the right hand did not show any fractures, dislocations, or other concerning findings. The injuries you have are most consistent with muscle and soft tissue strains and bruises from your fall. You were given an ACE wrap for your right hand today. Please wear it for support and comfort. Take Naproxen twice a day as prescribed for pain and swelling. Do not take any other NSAID medicines such as ibuprofen  (Advil , Motrin ) or Aleve while you are taking Naproxen. You may take Tylenol  (acetaminophen ) in addition, if needed for more pain, but do not take more than 4,000 mg in 24 hours. If your pain becomes severe, you may use your previously prescribed Percocet. Apply ice to your right hand and to the bruise on your left lower leg for 20 minutes at a time, several times a day. Always place a towel between the ice and your skin. For your back discomfort, you may use a warm compress or heating pad. Rest the injured areas as much as possible, elevate your hand when you can, and avoid overuse until the pain improves. Please follow up with your primary care provider or orthopedics if your pain is not improving over the next several days or if you have any concerns. Seek emergency care if you develop severe pain, new weakness or numbness, loss of movement, high fever, or if your symptoms suddenly get much worse.

## 2024-09-18 NOTE — ED Provider Notes (Signed)
 [facility]    CSN: [id number] Arrival date & time: 09/18/24  0809      History   Chief Complaint Chief Complaint  Patient presents with   Hand Injury    HPI Valerie [name] is a 78 y.o. female.   Discussed the use of AI scribe software for clinical note transcription with the patient, who gave verbal consent to proceed.   The patient, with a history of back problems and spinal stenosis, presents with right hand pain following a fall at home yesterday around 2:00 pm. She reports tripping over an object in her son's room and falling onto the floor. She denies striking her head or experiencing loss of consciousness. She describes a lot of little aches and pains throughout her body since the fall. Her primary concern is pain in the right hand, though she also reports discomfort in the right side of her back and notes swelling with bruising of the right lower leg, which is mildly sore. She denies neck pain, dizziness, headache, numbness, nausea, vomiting, gait disturbance, chest pain, shortness of breath, palpitations, or vision changes. She applied ice to her hand yesterday but has not taken medication for pain. She has Percocet at home, which she typically uses sparingly for her chronic back pain when symptoms worsen.  The following sections of the patient's history were reviewed and updated as appropriate: allergies, current medications, past family history, past medical history, past social history, past surgical history, and problem list.     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 disease, without long-term current use of insulin  (HCC) 08/19/2023   Iron  deficiency anemia secondary to inadequate dietary iron  intake 05/11/2023   Elevated coronary artery calcium  score 01/12/2023   Encounter for general adult medical examination with abnormal findings 05/05/2022   Stage 3a chronic kidney disease (HCC) 08/26/2021   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 08/06/2021   Tinea corporis 10/01/2020   Degenerative arthritis of right knee 07/02/2020   Moderate episode of recurrent major depressive disorder (HCC) 07/01/2020   Esophageal dysphagia 05/27/2020   Gastroesophageal reflux disease with esophagitis without hemorrhage 05/27/2020   Diuretic-induced hypokalemia 03/27/2020   OAB (overactive bladder) 03/27/2020   Insomnia secondary to chronic pain 02/21/2020   Primary osteoarthritis involving multiple joints 10/10/2019   Thiamine  deficiency, unspecified 01/09/2019   Eczema 08/18/2018   Vitamin D  deficiency disease 08/11/2018   Long-term current use of opiate analgesic 06/21/2018   External bleeding hemorrhoids 03/24/2018   Obstructive sleep apnea treated with BiPAP 11/03/2017   Osteopenia 03/28/2014   Insomnia 03/28/2014   Class 2 severe obesity due to excess calories with serious comorbidity and body mass index (BMI) of 36.0 to 36.9 in adult 04/14/2013  Visit for screening mammogram 01/20/2013   Spinal stenosis of lumbar 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 10/05/2012   Flu vaccine need 09/21/2011   Hypothyroidism 02/20/2011   Fatty liver disease, nonalcoholic 02/20/2011   Mild intermittent asthma 10/25/2009   Type II diabetes mellitus with manifestations (HCC) 05/06/2009   Hyperlipidemia with target LDL less than 100 05/06/2009   B12 deficiency anemia 05/06/2009    Essential hypertension, benign 05/06/2009   GERD 05/06/2009    Past Surgical History:  Procedure Laterality Date   benign tumors resected     CATARACT EXTRACTION Bilateral    FOOT SURGERY Left    bone spur   ROTATOR CUFF REPAIR Left    TUBAL LIGATION      OB History   No obstetric history on file.      Home Medications    Prior to Admission medications   Medication Sig Start Date End Date Taking? Authorizing Provider  methocarbamol (ROBAXIN) 500 MG tablet Take 1 tablet (500 mg total) by mouth in the morning and at bedtime. 09/18/24  Yes Iola Lukes, FNP  naproxen (NAPROSYN) 500 MG tablet Take 1 tablet (500 mg total) by mouth 2 (two) times daily with a meal. Take with food to avoid stomach upset. Do not take any additional NSAIDs while on this. You may take tylenol  in addition to this if needed for extra pain relief. 09/18/24  Yes Iola Lukes, FNP  albuterol  (PROVENTIL ) (2.5 MG/3ML) 0.083% nebulizer solution Take 3 mLs (2.5 mg total) by nebulization every 6 (six) hours as needed for wheezing or shortness of breath. 04/13/22   Joshua Debby CROME, MD  albuterol  (VENTOLIN  HFA) 108 (90 Base) MCG/ACT inhaler Inhale 2 puffs into the lungs every 6 (six) hours as needed. For shortness of breath 09/08/22   Joshua Debby CROME, MD  aspirin  81 MG tablet Take 81 mg by mouth daily.     [provider]  atorvastatin  (LIPITOR) 20 MG tablet TAKE 1 TABLET BY MOUTH EVERY DAY 09/07/24   Joshua Debby CROME, MD  azelastine  (ASTELIN ) 0.1 % nasal spray Place 1 spray into both nostrils 2 (two) times daily. Use in each nostril as directed 03/25/24   Reddick, Johnathan B, NP  Blood Glucose Calibration (ONETOUCH VERIO) LIQD 1 Act by In Vitro route daily. 02/12/23   Joshua Debby CROME, MD  Blood Glucose Monitoring Suppl (ONETOUCH VERIO FLEX SYSTEM) DEVI 1 Act by Does not apply route 2 (two) times daily. 02/12/23   Joshua Debby CROME, MD  budesonide -formoterol  (SYMBICORT ) 80-4.5 MCG/ACT inhaler Inhale 2 puffs into the  lungs 2 (two) times daily as needed. 09/08/22   Joshua Debby CROME, MD  carvedilol  (COREG ) 12.5 MG tablet Take 1 tablet (12.5 mg total) by mouth 2 (two) times daily with a meal. 08/09/24   Joshua Debby CROME, MD  Cholecalciferol  (VITAMIN D3) 50 MCG (2000 UT) capsule TAKE 1 CAPSULE BY MOUTH EVERY DAY 11/28/23   Joshua Debby CROME, MD  Dulaglutide  (TRULICITY ) 1.5 MG/0.5ML SOAJ Inject 1.5 mg into the skin once a week. 04/11/24   Joshua Debby CROME, MD  Empagliflozin -metFORMIN  HCl (SYNJARDY ) 12.5-500 MG TABS Take 1 tablet by mouth 2 (two) times daily. 04/11/24   Joshua Debby CROME, MD  famotidine  (PEPCID ) 40 MG tablet TAKE 1 TABLET BY MOUTH EVERY DAY 06/21/24   Joshua Debby CROME, MD  ferrous sulfate  325 (65 FE) MG tablet TAKE 1 TABLET BY MOUTH 3 TIMES DAILY WITH MEALS. 11/10/23   Joshua Debby CROME, MD  Finerenone  (KERENDIA ) 20 MG TABS Take  1 tablet (20 mg total) by mouth daily. Via [employer] Health and safety inspector) 03/12/24   Joshua Debby CROME, MD  gabapentin  (NEURONTIN ) 300 MG capsule TAKE 1 CAPSULE BY MOUTH THREE TIMES A DAY 09/08/22   Joshua Debby CROME, MD  glucose blood (FREESTYLE TEST STRIPS) test strip USE TO TEST BLOOD SUGAR 3 TIMES A DAY. DX: E11.8 02/06/21   Joshua Debby CROME, MD  isosorbide  mononitrate (IMDUR ) 30 MG 24 hr tablet Take 0.5 tablets (15 mg total) by mouth daily. Patient taking differently: Take 30 mg by mouth daily. 12/13/23   Daneen Damien BROCKS, NP  latanoprost  (XALATAN ) 0.005 % ophthalmic solution Place 1 drop into both eyes at bedtime.     [provider]  ONETOUCH VERIO test strip 1 EACH BY OTHER ROUTE 2 (TWO) TIMES DAILY. USE AS INSTRUCTED 08/13/23   Joshua Debby CROME, MD  oxyCODONE -acetaminophen  (PERCOCET/ROXICET) 5-325 MG tablet Take 1 tablet by mouth every 8 (eight) hours as needed for severe pain (pain score 7-10). 06/08/24   Joshua Debby CROME, MD  pantoprazole  (PROTONIX ) 40 MG tablet TAKE 1 TABLET BY MOUTH 2 TIMES DAILY BEFORE A MEAL 07/28/24   Joshua Debby CROME, MD  potassium chloride  SA (KLOR-CON  M) 20 MEQ tablet  TAKE 1 TABLET BY MOUTH EVERY DAY 08/22/24   Joshua Debby CROME, MD    Family History Family History  Problem Relation Age of Onset   Alcohol abuse Mother    Heart attack Father    Hypertension Father    Hyperlipidemia Father    Heart disease Sister    Atrial fibrillation Sister    Hypertension Sister    Hyperlipidemia Sister    Coronary artery disease Brother    Heart attack Brother    Hypertension Brother    Hyperlipidemia Brother    Colon cancer Brother 51   Hypertension Brother    Hyperlipidemia Brother    Dementia Brother    Diabetes Son    Hypertension Son    Hyperlipidemia Son    Diabetes Son    Hypertension Son    Hyperlipidemia Son    Cerebral palsy Son    Hypertension Son    Hyperlipidemia Son    Hypertension Other        family history   Alcohol abuse Other    Esophageal cancer Neg Hx    Stomach cancer Neg Hx    Pancreatic cancer Neg Hx    Liver disease Neg Hx     Social History Social History   Tobacco Use   Smoking status: Never    Passive exposure: Never   Smokeless tobacco: Never  Vaping Use   Vaping status: Never Used  Substance Use Topics   Alcohol use: No    Alcohol/week: 0.0 standard drinks of alcohol   Drug use: No     Allergies   Food and Penicillins   Review of Systems Review of Systems  Eyes:  Negative for photophobia and visual disturbance.  Respiratory:  Negative for shortness of breath.   Cardiovascular:  Negative for chest pain, palpitations and leg swelling.  Gastrointestinal:  Negative for nausea and vomiting.  Musculoskeletal:  Positive for arthralgias, back pain and joint swelling. Negative for gait problem.  Skin:  Positive for wound.  Neurological:  Negative for dizziness, weakness, numbness and headaches.  All other systems reviewed and are negative.    Physical Exam Triage Vital Signs ED Triage Vitals  Encounter Vitals Group     BP 09/18/24 0858 (!) 147/78  Girls Systolic BP Percentile --      Girls  Diastolic BP Percentile --      Boys Systolic BP Percentile --      Boys Diastolic BP Percentile --      Pulse Rate 09/18/24 0858 74     Resp 09/18/24 0858 17     Temp 09/18/24 0858 97.7 F (36.5 C)     Temp Source 09/18/24 0854 Oral     SpO2 09/18/24 0858 97 %     Weight --      Height --      Head Circumference --      Peak Flow --      Pain Score 09/18/24 0857 8     Pain Loc --      Pain Education --      Exclude from Growth Chart --    No data found.  Updated Vital Signs BP (!) 147/78 (BP Location: Right Arm)   Pulse 74   Temp 97.7 F (36.5 C) (Oral)   Resp 17   SpO2 97%   Visual Acuity Right Eye Distance:   Left Eye Distance:   Bilateral Distance:    Right Eye Near:   Left Eye Near:    Bilateral Near:     Physical Exam Vitals reviewed.  Constitutional:      General: She is awake. She is not in acute distress.    Appearance: Normal appearance. She is well-developed. She is not ill-appearing, toxic-appearing or diaphoretic.  HENT:     Head: Normocephalic.     Right Ear: Hearing normal.     Left Ear: Hearing normal.     Nose: Nose normal.     Mouth/Throat:     Mouth: Mucous membranes are moist.  Eyes:     General: Vision grossly intact.     Conjunctiva/sclera: Conjunctivae normal.  Cardiovascular:     Rate and Rhythm: Normal rate and regular rhythm.     Heart sounds: Normal heart sounds.  Pulmonary:     Effort: Pulmonary effort is normal.     Breath sounds: Normal breath sounds and air entry.  Abdominal:     Palpations: Abdomen is soft.  Musculoskeletal:        General: Normal range of motion.     Right shoulder: Normal.     Right upper arm: Normal.     Right elbow: Normal.     Right forearm: Normal.     Right wrist: Normal.     Right hand: Tenderness present. No swelling, deformity or bony tenderness. Normal range of motion (due to pain). Normal strength. Normal sensation. Normal capillary refill.     Left hand: Normal.       Hands:      Cervical back: Normal, full passive range of motion without pain, normal range of motion and neck supple.     Lumbar back: Normal.       Back:     Left lower leg: No deformity, lacerations or bony tenderness.       Legs:  Skin:    General: Skin is warm and dry.  Neurological:     General: No focal deficit present.     Mental Status: She is alert and oriented to person, place, and time.     Sensory: Sensation is intact. No sensory deficit.     Motor: Motor function is intact.     Coordination: Coordination is intact.     Gait: Gait is intact.  Psychiatric:  Mood and Affect: Mood and affect normal.        Speech: Speech normal.        Behavior: Behavior is cooperative.      UC Treatments / Results  Labs (all labs ordered are listed, but only abnormal results are displayed) Labs Reviewed - No data to display  EKG   Radiology DG Hand Complete Right Result Date: 09/18/2024 CLINICAL DATA:  Fall. EXAM: RIGHT HAND - COMPLETE 3+ VIEW COMPARISON:  None Available. FINDINGS: No acute osseous or joint abnormality. IMPRESSION: No acute osseous or joint abnormality. Electronically Signed   By: Newell Eke M.D.   On: 09/18/2024 09:51    Procedures Procedures (including critical care time)  Medications Ordered in UC Medications - No data to display  Initial Impression / Assessment and Plan / UC Course  I have reviewed the triage vital signs and the nursing notes.  Pertinent labs & imaging results that were available during my care of the patient were reviewed by me and considered in my medical decision making (see chart for details).     Patient presents with right hand pain, hematoma of left lower leg and right thoracic back pain following a fall at home 1 day ago. Findings are consistent with musculoskeletal soft tissue injuries. X-ray of the right hand was negative for acute fracture, dislocation, or other abnormalities. Supportive care was advised. Naproxen was  prescribed twice daily, and the patient was instructed not to use additional over-the-counter NSAIDs while on this medication. She may take acetaminophen  as needed for additional pain control, not to exceed 4,000 mg in a 24-hour period, or use her previously prescribed Percocet if pain becomes more severe. RICE therapy was reviewed, including rest, ice, compression, and elevation. Ice should be applied to the right hand and left lower leg hematoma for 20 minutes several times a day, with a towel placed between the ice and skin. An ACE wrap was applied to the hand in clinic. Warm compresses or heat were recommended for thoracic back discomfort. Follow-up with primary care or orthopedics as needed was advised.  Today's evaluation has revealed no signs of a dangerous process. Discussed diagnosis with patient and/or guardian. Patient and/or guardian aware of their diagnosis, possible red flag symptoms to watch out for and need for close follow up. Patient and/or guardian understands verbal and written discharge instructions. Patient and/or guardian comfortable with plan and disposition.  Patient and/or guardian has a clear mental status at this time, good insight into illness (after discussion and teaching) and has clear judgment to make decisions regarding their care  Documentation was completed with the aid of voice recognition software. Transcription may contain typographical errors.  Final Clinical Impressions(s) / UC Diagnoses   Final diagnoses:  Hand injury, right, initial encounter  Hematoma of left lower leg  Strain of thoracic back region  Fall at home, initial encounter     Discharge Instructions      Your x-ray of the right hand did not show any fractures, dislocations, or other concerning findings. The injuries you have are most consistent with muscle and soft tissue strains and bruises from your fall. You were given an ACE wrap for your right hand today. Please wear it for support and  comfort. Take Naproxen twice a day as prescribed for pain and swelling. Do not take any other NSAID medicines such as ibuprofen  (Advil , Motrin ) or Aleve while you are taking Naproxen. You may take Tylenol  (acetaminophen ) in addition, if needed for more pain,  but do not take more than 4,000 mg in 24 hours. If your pain becomes severe, you may use your previously prescribed Percocet. Apply ice to your right hand and to the bruise on your left lower leg for 20 minutes at a time, several times a day. Always place a towel between the ice and your skin. For your back discomfort, you may use a warm compress or heating pad. Rest the injured areas as much as possible, elevate your hand when you can, and avoid overuse until the pain improves. Please follow up with your primary care provider or orthopedics if your pain is not improving over the next several days or if you have any concerns. Seek emergency care if you develop severe pain, new weakness or numbness, loss of movement, high fever, or if your symptoms suddenly get much worse.     ED Prescriptions     Medication Sig Dispense Auth. Provider   naproxen (NAPROSYN) 500 MG tablet Take 1 tablet (500 mg total) by mouth 2 (two) times daily with a meal. Take with food to avoid stomach upset. Do not take any additional NSAIDs while on this. You may take tylenol  in addition to this if needed for extra pain relief. 20 tablet Iola Lukes, FNP   methocarbamol (ROBAXIN) 500 MG tablet Take 1 tablet (500 mg total) by mouth in the morning and at bedtime. 10 tablet Iola Lukes, FNP      PDMP not reviewed this encounter.   Iola Lukes, [place] 09/18/24 1024

## 2024-09-18 NOTE — Progress Notes (Signed)
 Complex Care Management Care Guide Note  09/18/2024 Name: Valerie [name] MRN: [id number] DOB: Apr 18, 1946  Valerie [name] is a 78 y.o. year old female who is a primary care patient of Joshua Debby CROME, MD and is actively engaged with the care management team. I reached out to Valerie [name] by phone today to assist with re-scheduling  with the Pharmacist.  Follow up plan: Unsuccessful telephone outreach attempt made. A HIPAA compliant phone message was left for the patient providing contact information and requesting a return call.  No further outreach attempts will be made due to inability to maintain patient contact.   Thedford Franks, CMA [place]  [facility], [facility] Guide Direct Dial : [phone]  Fax: [phone] Website: [email or web address]

## 2024-10-09 ENCOUNTER — Ambulatory Visit: Admitting: Internal Medicine

## 2024-10-12 ENCOUNTER — Ambulatory Visit (INDEPENDENT_AMBULATORY_CARE_PROVIDER_SITE_OTHER)

## 2024-10-12 DIAGNOSIS — E538 Deficiency of other specified B group vitamins: Secondary | ICD-10-CM | POA: Diagnosis not present

## 2024-10-12 MED ORDER — CYANOCOBALAMIN 1000 MCG/ML IJ SOLN
1000.0000 ug | Freq: Once | INTRAMUSCULAR | Status: AC
  Administered 2024-10-12: 1000 ug via INTRAMUSCULAR

## 2024-10-12 NOTE — Progress Notes (Signed)
 After obtaining consent, and per orders of Dr. Joshua, injection of B12 given by Ronnald SHAUNNA [place]. Patient instructed to report any adverse reaction to me immediately.

## 2024-10-27 ENCOUNTER — Other Ambulatory Visit: Payer: Self-pay | Admitting: Internal Medicine

## 2024-10-27 DIAGNOSIS — Z1231 Encounter for screening mammogram for malignant neoplasm of breast: Secondary | ICD-10-CM | POA: Diagnosis not present

## 2024-10-27 LAB — HM MAMMOGRAPHY

## 2024-10-31 ENCOUNTER — Encounter: Payer: Self-pay | Admitting: Internal Medicine

## 2024-11-06 ENCOUNTER — Ambulatory Visit: Payer: Self-pay

## 2024-11-06 NOTE — Telephone Encounter (Signed)
 FYI Only or Action Required?: Action required by provider: request for appointment.  Patient was last seen in primary care on 08/09/2024 by Joshua Debby CROME, MD.  Called Nurse Triage reporting Fall.  Symptoms began a week ago.  Interventions attempted: Nothing.  Symptoms are: unchanged.Fell last week, hurt knee and back.  Triage Disposition: See PCP When Office is Open (Within 3 Days)  Patient/caregiver understands and will follow disposition?: Yes     Copied from CRM #[id number]. Topic: Clinical - Red Word Triage >> Nov 06, 2024  9:16 AM Wess RAMAN wrote: Red Word that prompted transfer to Nurse Triage: Patient had a couple of recent falls in Oct, hurting her hands. She also fell last week in the driveway. She fell face down and hurt her knee and back. Answer Assessment - Initial Assessment Questions 1. MECHANISM: How did the fall happen?     Fell in driveway 2. DOMESTIC VIOLENCE AND ELDER ABUSE SCREENING: Did you fall because someone pushed you or tried to hurt you? If Yes, ask: Are you safe now?     no 3. ONSET: When did the fall happen? (e.g., minutes, hours, or days ago)     Last week 4. LOCATION: What part of the body hit the ground? (e.g., back, buttocks, head, hips, knees, hands, head, stomach)     Knee and back 5. INJURY: Did you hurt (injure) yourself when you fell? If Yes, ask: What did you injure? Tell me more about this? (e.g., body area; type of injury; pain severity)     yes 6. PAIN: Is there any pain? If Yes, ask: How bad is the pain? (e.g., Scale 0-10; or none, mild,      6 7. SIZE: For cuts, bruises, or swelling, ask: How large is it? (e.g., inches or centimeters)      no 8. PREGNANCY: Is there any chance you are pregnant? When was your last menstrual period?     no 9. OTHER SYMPTOMS: Do you have any other symptoms? (e.g., dizziness, fever, weakness; new-onset or worsening).      no 10. CAUSE: What do you think caused the fall (or  falling)? (e.g., dizzy spell, tripped)       Maybe tripped  Protocols used: Falls and [facility]  Reason for Disposition  MILD weakness (e.g., does not interfere with ability to work, go to school, normal activities)  (Exception: Mild weakness is a chronic symptom.)  Answer Assessment - Initial Assessment Questions 1. MECHANISM: How did the fall happen?     Fell in driveway 2. DOMESTIC VIOLENCE AND ELDER ABUSE SCREENING: Did you fall because someone pushed you or tried to hurt you? If Yes, ask: Are you safe now?     no 3. ONSET: When did the fall happen? (e.g., minutes, hours, or days ago)     Last week 4. LOCATION: What part of the body hit the ground? (e.g., back, buttocks, head, hips, knees, hands, head, stomach)     Knee and back 5. INJURY: Did you hurt (injure) yourself when you fell? If Yes, ask: What did you injure? Tell me more about this? (e.g., body area; type of injury; pain severity)     yes 6. PAIN: Is there any pain? If Yes, ask: How bad is the pain? (e.g., Scale 0-10; or none, mild,      6 7. SIZE: For cuts, bruises, or swelling, ask: How large is it? (e.g., inches or centimeters)      no 8. PREGNANCY: Is there any chance  you are pregnant? When was your last menstrual period?     no 9. OTHER SYMPTOMS: Do you have any other symptoms? (e.g., dizziness, fever, weakness; new-onset or worsening).      no 10. CAUSE: What do you think caused the fall (or falling)? (e.g., dizzy spell, tripped)       Maybe tripped  Protocols used: Falls and [facility]

## 2024-11-08 ENCOUNTER — Encounter: Payer: Self-pay | Admitting: Pharmacist

## 2024-11-08 ENCOUNTER — Ambulatory Visit: Admitting: Internal Medicine

## 2024-11-08 ENCOUNTER — Other Ambulatory Visit: Payer: Self-pay | Admitting: Nurse Practitioner

## 2024-11-08 ENCOUNTER — Encounter: Payer: Self-pay | Admitting: Internal Medicine

## 2024-11-08 ENCOUNTER — Ambulatory Visit

## 2024-11-08 ENCOUNTER — Ambulatory Visit: Attending: Internal Medicine

## 2024-11-08 VITALS — BP 134/86 | HR 72 | Temp 97.7°F | Ht 66.0 in | Wt 201.4 lb

## 2024-11-08 DIAGNOSIS — M25562 Pain in left knee: Secondary | ICD-10-CM | POA: Diagnosis not present

## 2024-11-08 DIAGNOSIS — R55 Syncope and collapse: Secondary | ICD-10-CM | POA: Insufficient documentation

## 2024-11-08 DIAGNOSIS — S8992XA Unspecified injury of left lower leg, initial encounter: Secondary | ICD-10-CM | POA: Insufficient documentation

## 2024-11-08 DIAGNOSIS — Z23 Encounter for immunization: Secondary | ICD-10-CM

## 2024-11-08 DIAGNOSIS — M545 Low back pain, unspecified: Secondary | ICD-10-CM | POA: Insufficient documentation

## 2024-11-08 DIAGNOSIS — S8002XA Contusion of left knee, initial encounter: Secondary | ICD-10-CM

## 2024-11-08 DIAGNOSIS — E118 Type 2 diabetes mellitus with unspecified complications: Secondary | ICD-10-CM

## 2024-11-08 DIAGNOSIS — M47816 Spondylosis without myelopathy or radiculopathy, lumbar region: Secondary | ICD-10-CM | POA: Diagnosis not present

## 2024-11-08 DIAGNOSIS — S66901A Unspecified injury of unspecified muscle, fascia and tendon at wrist and hand level, right hand, initial encounter: Secondary | ICD-10-CM | POA: Insufficient documentation

## 2024-11-08 DIAGNOSIS — M7989 Other specified soft tissue disorders: Secondary | ICD-10-CM | POA: Diagnosis not present

## 2024-11-08 DIAGNOSIS — M1712 Unilateral primary osteoarthritis, left knee: Secondary | ICD-10-CM | POA: Diagnosis not present

## 2024-11-08 DIAGNOSIS — D510 Vitamin B12 deficiency anemia due to intrinsic factor deficiency: Secondary | ICD-10-CM

## 2024-11-08 DIAGNOSIS — M5136 Other intervertebral disc degeneration, lumbar region with discogenic back pain only: Secondary | ICD-10-CM | POA: Diagnosis not present

## 2024-11-08 LAB — CBC WITH DIFFERENTIAL/PLATELET
Basophils Absolute: 0.1 K/uL (ref 0.0–0.1)
Basophils Relative: 0.8 % (ref 0.0–3.0)
Eosinophils Absolute: 0.4 K/uL (ref 0.0–0.7)
Eosinophils Relative: 5.2 % — ABNORMAL HIGH (ref 0.0–5.0)
HCT: 29.2 % — ABNORMAL LOW (ref 36.0–46.0)
Hemoglobin: 9.6 g/dL — ABNORMAL LOW (ref 12.0–15.0)
Lymphocytes Relative: 30.1 % (ref 12.0–46.0)
Lymphs Abs: 2.2 K/uL (ref 0.7–4.0)
MCHC: 32.9 g/dL (ref 30.0–36.0)
MCV: 87.6 fl (ref 78.0–100.0)
Monocytes Absolute: 0.6 K/uL (ref 0.1–1.0)
Monocytes Relative: 8.3 % (ref 3.0–12.0)
Neutro Abs: 4.1 K/uL (ref 1.4–7.7)
Neutrophils Relative %: 55.6 % (ref 43.0–77.0)
Platelets: 298 K/uL (ref 150.0–400.0)
RBC: 3.34 Mil/uL — ABNORMAL LOW (ref 3.87–5.11)
RDW: 15.4 % (ref 11.5–15.5)
WBC: 7.4 K/uL (ref 4.0–10.5)

## 2024-11-08 LAB — HEMOGLOBIN A1C: Hgb A1c MFr Bld: 5.6 % (ref 4.6–6.5)

## 2024-11-08 LAB — FOLATE: Folate: 7.7 ng/mL (ref >5.9)

## 2024-11-08 NOTE — Patient Instructions (Signed)

## 2024-11-08 NOTE — Progress Notes (Unsigned)
 EP to read.

## 2024-11-08 NOTE — Progress Notes (Signed)
 Pharmacy Quality Measure Review  This patient is appearing on a report for being at risk of failing the adherence measure for diabetes medications this calendar year.   Medication: Trulicity  Last fill date: 10/24/24 for 84 day supply  Insurance report was not up to date. No action needed at this time.   Darrelyn Drum, PharmD, BCPS, CPP Clinical Pharmacist Practitioner [facility] [phone]

## 2024-11-08 NOTE — Progress Notes (Signed)
 Subjective:  Patient ID: Valerie [name], female    DOB: 1946/03/22  Age: 78 y.o. MRN: [id number]  CC: Fall (2 falls once in October and Last Tuesday. Left Knee, Lower back and Right hand pain came with the fall from last Tuesday. )   HPI Valerie [name] presents for f/up ---  Discussed the use of AI scribe software for clinical note transcription with the patient, who gave verbal consent to proceed.  History of Present Illness Valerie [name] is a 78 year old female who presents with recurrent falls and balance issues.  She experiences episodes of feeling off balance, which have led to falls. In October, she fell while helping her grandson, Rutha, going 'face down' without losing consciousness. No palpitations, dizziness, or lightheadedness were noted at the time of the falls, although she does experience lightheadedness at other times.  A recent fall occurred a week ago on a Tuesday, where she fell face down on a cement driveway, resulting in a swollen right hand, a bruised and swollen left knee, and persistent lower back pain. Following the fall in October, she visited urgent care, where her right hand was x-rayed and diagnosed as sprained. She was prescribed naproxen  and methocarbamol , which she has since completed.  No numbness, weakness, or tingling. She sometimes feels her heart beating irregularly. She has not yet received a flu shot this season.   Outpatient Medications Prior to Visit  Medication Sig Dispense Refill   albuterol  (PROVENTIL ) (2.5 MG/3ML) 0.083% nebulizer solution Take 3 mLs (2.5 mg total) by nebulization every 6 (six) hours as needed for wheezing or shortness of breath. 75 mL 3   albuterol  (VENTOLIN  HFA) 108 (90 Base) MCG/ACT inhaler Inhale 2 puffs into the lungs every 6 (six) hours as needed. For shortness of breath 3 each 4   aspirin  81 MG tablet Take 81 mg by mouth daily.      atorvastatin  (LIPITOR) 20 MG tablet TAKE 1 TABLET BY MOUTH EVERY DAY 90 tablet 0    azelastine  (ASTELIN ) 0.1 % nasal spray Place 1 spray into both nostrils 2 (two) times daily. Use in each nostril as directed 30 mL 1   Blood Glucose Calibration (ONETOUCH VERIO) LIQD 1 Act by In Vitro route daily. 1 each 5   Blood Glucose Monitoring Suppl (ONETOUCH VERIO FLEX SYSTEM) DEVI 1 Act by Does not apply route 2 (two) times daily. 1 each 3   budesonide -formoterol  (SYMBICORT ) 80-4.5 MCG/ACT inhaler Inhale 2 puffs into the lungs 2 (two) times daily as needed. 3 each 1   carvedilol  (COREG ) 12.5 MG tablet Take 1 tablet (12.5 mg total) by mouth 2 (two) times daily with a meal. 180 tablet 0   Cholecalciferol  (VITAMIN D3) 50 MCG (2000 UT) capsule TAKE 1 CAPSULE BY MOUTH EVERY DAY 90 capsule 1   Dulaglutide  (TRULICITY ) 1.5 MG/0.5ML SOAJ INJECT 1.5 MG SUBCUTANEOUSLY ONCE A WEEK 6 mL 1   Empagliflozin -metFORMIN  HCl (SYNJARDY ) 12.5-500 MG TABS Take 1 tablet by mouth 2 (two) times daily. 180 tablet 1   famotidine  (PEPCID ) 40 MG tablet TAKE 1 TABLET BY MOUTH EVERY DAY 90 tablet 1   ferrous sulfate  325 (65 FE) MG tablet TAKE 1 TABLET BY MOUTH 3 TIMES DAILY WITH MEALS. 270 tablet 0   Finerenone  (KERENDIA ) 20 MG TABS Take 1 tablet (20 mg total) by mouth daily. Via Patient Assistance ([employer]) 90 tablet 1   fluticasone  (FLONASE) 50 MCG/ACT nasal spray Place 2 sprays into both nostrils daily.  gabapentin  (NEURONTIN ) 300 MG capsule TAKE 1 CAPSULE BY MOUTH THREE TIMES A DAY 270 capsule 1   glucose blood (FREESTYLE TEST STRIPS) test strip USE TO TEST BLOOD SUGAR 3 TIMES A DAY. DX: E11.8 300 strip 1   isosorbide  mononitrate (IMDUR ) 30 MG 24 hr tablet Take 0.5 tablets (15 mg total) by mouth daily. (Patient taking differently: Take 30 mg by mouth daily.) 45 tablet 3   latanoprost  (XALATAN ) 0.005 % ophthalmic solution Place 1 drop into both eyes at bedtime.      methocarbamol  (ROBAXIN ) 500 MG tablet Take 1 tablet (500 mg total) by mouth in the morning and at bedtime. 10 tablet 0   naproxen  (NAPROSYN ) 500 MG tablet  Take 1 tablet (500 mg total) by mouth 2 (two) times daily with a meal. Take with food to avoid stomach upset. Do not take any additional NSAIDs while on this. You may take tylenol  in addition to this if needed for extra pain relief. 20 tablet 0   ONETOUCH VERIO test strip 1 EACH BY OTHER ROUTE 2 (TWO) TIMES DAILY. USE AS INSTRUCTED 200 strip 1   oxyCODONE -acetaminophen  (PERCOCET/ROXICET) 5-325 MG tablet Take 1 tablet by mouth every 8 (eight) hours as needed for severe pain (pain score 7-10). 90 tablet 0   pantoprazole  (PROTONIX ) 40 MG tablet TAKE 1 TABLET BY MOUTH 2 TIMES DAILY BEFORE A MEAL 180 tablet 0   potassium chloride  SA (KLOR-CON  M) 20 MEQ tablet TAKE 1 TABLET BY MOUTH EVERY DAY 90 tablet 0   No facility-administered medications prior to visit.    ROS Review of Systems  Constitutional:  Negative for appetite change, chills, diaphoresis, fatigue and fever.  HENT: Negative.    Eyes: Negative.   Respiratory: Negative.  Negative for cough, chest tightness, wheezing and stridor.   Cardiovascular:  Negative for chest pain, palpitations and leg swelling.  Gastrointestinal: Negative.  Negative for abdominal pain, constipation, diarrhea, nausea and vomiting.  Genitourinary: Negative.  Negative for difficulty urinating.  Musculoskeletal:  Positive for arthralgias, back pain, gait problem and joint swelling.  Neurological:  Negative for dizziness, weakness, light-headedness and headaches.  Hematological:  Negative for adenopathy. Does not bruise/bleed easily.  Psychiatric/Behavioral: Negative.      Objective:  BP 134/86 (BP Location: Left Arm, Patient Position: Sitting, Cuff Size: Normal)   Pulse 72   Temp 97.7 F (36.5 C) (Oral)   Ht 5' 6 (1.676 m)   Wt 201 lb 6.4 oz (91.4 kg)   SpO2 99%   BMI 32.51 kg/m   BP Readings from Last 3 Encounters:  11/08/24 134/86  09/18/24 (!) 147/78  08/09/24 108/68    Wt Readings from Last 3 Encounters:  11/08/24 201 lb 6.4 oz (91.4 kg)   08/09/24 199 lb (90.3 kg)  08/07/24 200 lb 4.8 oz (90.9 kg)    Physical Exam Vitals reviewed.  Constitutional:      Appearance: Normal appearance.  HENT:     Nose: Nose normal.     Mouth/Throat:     Mouth: Mucous membranes are moist.  Eyes:     General: No scleral icterus.    Conjunctiva/sclera: Conjunctivae normal.  Cardiovascular:     Rate and Rhythm: Normal rate and regular rhythm.     Heart sounds: No murmur heard.    No friction rub. No gallop.  Pulmonary:     Effort: Pulmonary effort is normal.     Breath sounds: No stridor. No wheezing, rhonchi or rales.  Abdominal:     General:  Abdomen is protuberant. There is no distension.     Palpations: There is no hepatomegaly, splenomegaly or mass.     Tenderness: There is no abdominal tenderness. There is no guarding.     Hernia: No hernia is present.  Musculoskeletal:        General: Swelling and tenderness present. No deformity.     Right hand: Normal. No swelling or deformity. Normal range of motion.     Left hand: No swelling or deformity. Normal range of motion.     Cervical back: Neck supple.     Right knee: Normal.     Left knee: Swelling, ecchymosis and bony tenderness present. No deformity or crepitus. Normal range of motion.     Right lower leg: No edema.     Left lower leg: No edema.  Lymphadenopathy:     Cervical: No cervical adenopathy.  Skin:    General: Skin is warm and dry.  Neurological:     Mental Status: She is alert. Mental status is at baseline.  Psychiatric:        Mood and Affect: Mood normal.        Behavior: Behavior normal.     Lab Results  Component Value Date   WBC 7.4 11/08/2024   HGB 9.6 (L) 11/08/2024   HCT 29.2 (L) 11/08/2024   PLT 298.0 11/08/2024   GLUCOSE 105 (H) 08/09/2024   CHOL 167 04/19/2024   TRIG 223.0 (H) 04/19/2024   HDL 51.70 04/19/2024   LDLDIRECT 60.0 05/11/2023   LDLCALC 71 04/19/2024   ALT 15 12/21/2023   AST 17 12/21/2023   NA 139 08/09/2024   K 4.0  08/09/2024   CL 100 08/09/2024   CREATININE 1.03 08/09/2024   BUN 11 08/09/2024   CO2 28 08/09/2024   TSH 3.30 04/19/2024   INR 1.1 (H) 04/10/2021   HGBA1C 5.6 11/08/2024   MICROALBUR <0.7 04/19/2024    DG Hand Complete Right Result Date: 09/18/2024 CLINICAL DATA:  Fall. EXAM: RIGHT HAND - COMPLETE 3+ VIEW COMPARISON:  None Available. FINDINGS: No acute osseous or joint abnormality. IMPRESSION: No acute osseous or joint abnormality. Electronically Signed   By: Newell Eke M.D.   On: 09/18/2024 09:51    DG Knee Complete 4 Views Left Result Date: 11/08/2024 CLINICAL DATA:  Fall.  Left knee pain and swelling. EXAM: LEFT KNEE - COMPLETE 4+ VIEW COMPARISON:  None Available. FINDINGS: No evidence of fracture, dislocation, or joint effusion. Mild tricompartmental osteoarthritis is seen. Soft tissues are unremarkable. IMPRESSION: No acute findings. Mild tricompartmental osteoarthritis. Electronically Signed   By: Norleen DELENA Kil M.D.   On: 11/08/2024 10:49   DG Lumbar Spine Complete Result Date: 11/08/2024 CLINICAL DATA:  Fall.  Low back pain. EXAM: LUMBAR SPINE - COMPLETE 4+ VIEW COMPARISON:  04/09/2020 FINDINGS: There is no evidence of lumbar spine fracture. Alignment is normal. Degenerative disc disease and bilateral facet DJD is seen at all levels, worst at L4-5. Transitional lumbosacral vertebra noted, which is referred to as L5 for descriptive purposes. IMPRESSION: No acute findings. Degenerative spondylosis, as described above. Electronically Signed   By: Norleen DELENA Kil M.D.   On: 11/08/2024 10:48   DG Hand Complete Right Result Date: 09/18/2024 CLINICAL DATA:  Fall. EXAM: RIGHT HAND - COMPLETE 3+ VIEW COMPARISON:  None Available. FINDINGS: No acute osseous or joint abnormality. IMPRESSION: No acute osseous or joint abnormality. Electronically Signed   By: Newell Eke M.D.   On: 09/18/2024 09:51  Assessment & Plan:   Syncope and collapse- Will evaluate for dysrhythmia. -      LONG TERM MONITOR (3-14 DAYS); Future  Type II diabetes mellitus with manifestations (HCC)- Blood sugar is well controlled. -     Hemoglobin A1c; Future -     HM Diabetes Foot Exam  Vitamin B12 deficiency anemia due to intrinsic factor deficiency -     Folate; Future -     CBC with Differential/Platelet; Future  Injury of left knee, initial encounter -     DG Knee Complete 4 Views Left; Future  Acute bilateral low back pain without sciatica -     DG Lumbar Spine Complete; Future  Injury of muscle of right hand -     Ambulatory referral to Orthopedic Surgery  Need for immunization against influenza -     Flu vaccine HIGH DOSE PF(Fluzone Trivalent)     Follow-up: Return in about 3 months (around 02/08/2025).  Debby Molt, MD

## 2024-11-13 ENCOUNTER — Ambulatory Visit

## 2024-11-13 ENCOUNTER — Ambulatory Visit: Admitting: Internal Medicine

## 2024-11-13 ENCOUNTER — Ambulatory Visit: Payer: Self-pay | Admitting: Internal Medicine

## 2024-11-13 DIAGNOSIS — E538 Deficiency of other specified B group vitamins: Secondary | ICD-10-CM

## 2024-11-13 MED ORDER — CYANOCOBALAMIN 1000 MCG/ML IJ SOLN
1000.0000 ug | Freq: Once | INTRAMUSCULAR | Status: AC
  Administered 2024-11-13: 1000 ug via INTRAMUSCULAR

## 2024-11-13 NOTE — Progress Notes (Signed)
 After obtaining consent, and per orders of Dr. Joshua, injection of B12 given by Ronnald SHAUNNA [place]. Patient instructed to report any adverse reaction to me immediately.

## 2024-11-14 NOTE — Telephone Encounter (Signed)
 Pts medication was increased to 30 mg of Imdur  at her April, 2025 ov. It looks like the RX wasn't updated at that appointment. Refill will be adjusted correctly.

## 2024-11-15 ENCOUNTER — Other Ambulatory Visit: Payer: Self-pay | Admitting: Internal Medicine

## 2024-11-15 DIAGNOSIS — E876 Hypokalemia: Secondary | ICD-10-CM

## 2024-11-15 DIAGNOSIS — I10 Essential (primary) hypertension: Secondary | ICD-10-CM

## 2024-11-21 NOTE — Progress Notes (Unsigned)
 [name] - 78 y.o. female MRN [id number]  Date of birth: 1946-07-10  Office Visit Note: Visit Date: 11/22/2024 PCP: Joshua Debby CROME, MD Referred by: Joshua Debby CROME, MD  Subjective: No chief complaint on file.  HPI: Valerie [name] is a pleasant 78 y.o. female who presents today for ***  Pertinent ROS were reviewed with the patient and found to be negative unless otherwise specified above in HPI.   Visit Reason: Duration of symptoms: Hand dominance: {RIGHT/LEFT:20294} Occupation: Diabetic: {yes/no:20286} Smoking: {yes/no:20286} Heart/Lung History: Blood Thinners:   Prior Testing/EMG: Injections (Date): Treatments: Prior Surgery:    Assessment & Plan: Visit Diagnoses: No diagnosis found.  Plan: ***  Follow-up: No follow-ups on file.   Meds & Orders: No orders of the defined types were placed in this encounter.  No orders of the defined types were placed in this encounter.    Procedures: No procedures performed      Clinical History: No specialty comments available.  She reports that she has never smoked. She has never been exposed to tobacco smoke. She has never used smokeless tobacco.  Recent Labs    12/21/23 1358 04/19/24 1415 11/08/24 1148  HGBA1C 6.4 5.8 5.6    Objective:   Vital Signs: There were no vitals taken for this visit.  Physical Exam  Gen: Well-appearing, in no acute distress; non-toxic CV: Regular Rate. Well-perfused. Warm.  Resp: Breathing unlabored on room air; no wheezing. Psych: Fluid speech in conversation; appropriate affect; normal thought process  Ortho Exam - ***   Imaging: No results found.  Past Medical/Family/Surgical/Social History: Medications & Allergies reviewed per EMR, new medications updated. Patient Active Problem List   Diagnosis Date Noted   Syncope and collapse 11/08/2024   Injury of left knee 11/08/2024   Acute bilateral low back pain without sciatica 11/08/2024   Injury of muscle of right  hand 11/08/2024   Idiopathic hypotension 08/09/2024   Seasonal allergic rhinitis 04/03/2024   Type 2 diabetes mellitus with stage 3b chronic kidney disease, without long-term current use of insulin  (HCC) 08/19/2023   Iron  deficiency anemia secondary to inadequate dietary iron  intake 05/11/2023   Elevated coronary artery calcium  score 01/12/2023   Encounter for general adult medical examination with abnormal findings 05/05/2022   Stage 3a chronic kidney disease (HCC) 08/26/2021   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 08/06/2021   Tinea corporis 10/01/2020   Degenerative arthritis of right knee 07/02/2020   Moderate episode of recurrent major depressive disorder (HCC) 07/01/2020   Esophageal dysphagia 05/27/2020   Gastroesophageal reflux disease with esophagitis without hemorrhage 05/27/2020   Diuretic-induced hypokalemia 03/27/2020   OAB (overactive bladder) 03/27/2020   Insomnia secondary to chronic pain 02/21/2020   Primary osteoarthritis involving multiple joints 10/10/2019   Thiamine  deficiency, unspecified 01/09/2019   Eczema 08/18/2018   Vitamin D  deficiency disease 08/11/2018   Long-term current use of opiate analgesic 06/21/2018   External bleeding hemorrhoids 03/24/2018   Obstructive sleep apnea treated with BiPAP 11/03/2017   Osteopenia 03/28/2014   Insomnia 03/28/2014   Class 2 severe obesity due to excess calories with serious comorbidity and body mass index (BMI) of 36.0 to 36.9 in adult 04/14/2013   Visit for screening mammogram 01/20/2013   Spinal stenosis of lumbar 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 10/05/2012   Flu vaccine need 09/21/2011   Hypothyroidism 02/20/2011   Fatty liver disease, nonalcoholic 02/20/2011   Mild intermittent asthma 10/25/2009   Type II diabetes mellitus with manifestations (HCC) 05/06/2009   Hyperlipidemia with  target LDL less than 100 05/06/2009   B12 deficiency anemia 05/06/2009   Essential hypertension, benign 05/06/2009   GERD 05/06/2009    Past Medical History:  Diagnosis Date   Anemia    Anxiety and depression    Arthritis    Asthma    Cataract    Degenerative arthritis    Depression    Diabetes mellitus, type 2 (HCC)    Fatigue    GERD (gastroesophageal reflux disease)    Glaucoma    Heart palpitations    Hemorrhoids    Hyperlipidemia    Hypertension    Hypothyroidism    Hypoxemia 11/24/2013   Memory deficit 10/04/2013   Obesity    Sleep apnea    BiPAP   Snoring disorder    Family History  Problem Relation Age of Onset   Alcohol abuse Mother    Heart attack Father    Hypertension Father    Hyperlipidemia Father    Heart disease Sister    Atrial fibrillation Sister    Hypertension Sister    Hyperlipidemia Sister    Coronary artery disease Brother    Heart attack Brother    Hypertension Brother    Hyperlipidemia Brother    Colon cancer Brother 59   Hypertension Brother    Hyperlipidemia Brother    Dementia Brother    Diabetes Son    Hypertension Son    Hyperlipidemia Son    Diabetes Son    Hypertension Son    Hyperlipidemia Son    Cerebral palsy Son    Hypertension Son    Hyperlipidemia Son    Hypertension Other        family history   Alcohol abuse Other    Esophageal cancer Neg Hx    Stomach cancer Neg Hx    Pancreatic cancer Neg Hx    Liver disease Neg Hx    Past Surgical History:  Procedure Laterality Date   benign tumors resected     CATARACT EXTRACTION Bilateral    FOOT SURGERY Left    bone spur   ROTATOR CUFF REPAIR Left    TUBAL LIGATION     Social History   Occupational History   Occupation: disabled  Tobacco Use   Smoking status: Never    Passive exposure: Never   Smokeless tobacco: Never  Vaping Use   Vaping status: Never Used  Substance and Sexual Activity   Alcohol use: No    Alcohol/week: 0.0 standard drinks of alcohol   Drug use: No   Sexual activity: Not Currently    Partners: Male    Comment: not working, lives with husband, 3 sons    Valerie [name]  Valerie) [name], M.D. [facility], Hand Surgery

## 2024-11-22 ENCOUNTER — Other Ambulatory Visit: Payer: Self-pay

## 2024-11-22 ENCOUNTER — Ambulatory Visit: Admitting: Orthopedic Surgery

## 2024-11-22 DIAGNOSIS — M25531 Pain in right wrist: Secondary | ICD-10-CM

## 2024-11-24 DIAGNOSIS — Z23 Encounter for immunization: Secondary | ICD-10-CM | POA: Insufficient documentation

## 2024-11-24 DIAGNOSIS — E119 Type 2 diabetes mellitus without complications: Secondary | ICD-10-CM | POA: Insufficient documentation

## 2024-11-24 NOTE — Addendum Note (Signed)
 Addended by: JOSHUA DEBBY CROME on: 11/24/2024 08:26 AM   Modules accepted: Level of Service

## 2024-12-07 ENCOUNTER — Other Ambulatory Visit: Payer: Self-pay | Admitting: Internal Medicine

## 2024-12-07 DIAGNOSIS — E785 Hyperlipidemia, unspecified: Secondary | ICD-10-CM

## 2024-12-15 ENCOUNTER — Ambulatory Visit

## 2024-12-15 DIAGNOSIS — E538 Deficiency of other specified B group vitamins: Secondary | ICD-10-CM | POA: Diagnosis not present

## 2024-12-15 MED ORDER — CYANOCOBALAMIN 1000 MCG/ML IJ SOLN
1000.0000 ug | Freq: Once | INTRAMUSCULAR | Status: AC
  Administered 2024-12-15: 1000 ug via INTRAMUSCULAR

## 2024-12-15 NOTE — Progress Notes (Signed)
 After obtaining consent, and per orders of Dr. Joshua, injection of B12 given by Ronnald SHAUNNA [place]. Patient instructed to report any adverse reaction to me immediately.

## 2024-12-24 ENCOUNTER — Other Ambulatory Visit: Payer: Self-pay | Admitting: Internal Medicine

## 2024-12-24 DIAGNOSIS — K2100 Gastro-esophageal reflux disease with esophagitis, without bleeding: Secondary | ICD-10-CM

## 2025-01-03 ENCOUNTER — Ambulatory Visit: Admitting: Orthopedic Surgery

## 2025-01-17 ENCOUNTER — Ambulatory Visit

## 2025-01-29 ENCOUNTER — Ambulatory Visit: Admitting: Orthopedic Surgery

## 2025-01-31 ENCOUNTER — Ambulatory Visit

## 2025-02-12 ENCOUNTER — Ambulatory Visit: Admitting: Internal Medicine

## 2025-03-22 ENCOUNTER — Ambulatory Visit
